# Patient Record
Sex: Female | Born: 1960
Health system: Southern US, Community
[De-identification: ages and names within clinical notes are randomized; demographics above are authoritative.]

## PROBLEM LIST (undated history)

## (undated) DIAGNOSIS — I10 Essential (primary) hypertension: Secondary | ICD-10-CM

## (undated) DIAGNOSIS — M1990 Unspecified osteoarthritis, unspecified site: Secondary | ICD-10-CM

## (undated) DIAGNOSIS — Z8669 Personal history of other diseases of the nervous system and sense organs: Secondary | ICD-10-CM

## (undated) DIAGNOSIS — R079 Chest pain, unspecified: Secondary | ICD-10-CM

## (undated) DIAGNOSIS — E559 Vitamin D deficiency, unspecified: Secondary | ICD-10-CM

## (undated) DIAGNOSIS — Z8489 Family history of other specified conditions: Secondary | ICD-10-CM

## (undated) DIAGNOSIS — I2510 Atherosclerotic heart disease of native coronary artery without angina pectoris: Secondary | ICD-10-CM

## (undated) DIAGNOSIS — T4145XA Adverse effect of unspecified anesthetic, initial encounter: Secondary | ICD-10-CM

## (undated) DIAGNOSIS — G43909 Migraine, unspecified, not intractable, without status migrainosus: Secondary | ICD-10-CM

## (undated) DIAGNOSIS — E785 Hyperlipidemia, unspecified: Secondary | ICD-10-CM

## (undated) DIAGNOSIS — Z9889 Other specified postprocedural states: Secondary | ICD-10-CM

## (undated) DIAGNOSIS — K5790 Diverticulosis of intestine, part unspecified, without perforation or abscess without bleeding: Secondary | ICD-10-CM

## (undated) DIAGNOSIS — K651 Peritoneal abscess: Secondary | ICD-10-CM

## (undated) DIAGNOSIS — C4490 Unspecified malignant neoplasm of skin, unspecified: Secondary | ICD-10-CM

## (undated) DIAGNOSIS — I219 Acute myocardial infarction, unspecified: Secondary | ICD-10-CM

## (undated) DIAGNOSIS — R7303 Prediabetes: Secondary | ICD-10-CM

## (undated) DIAGNOSIS — M2550 Pain in unspecified joint: Secondary | ICD-10-CM

## (undated) DIAGNOSIS — K5792 Diverticulitis of intestine, part unspecified, without perforation or abscess without bleeding: Secondary | ICD-10-CM

## (undated) DIAGNOSIS — K5900 Constipation, unspecified: Secondary | ICD-10-CM

## (undated) DIAGNOSIS — R112 Nausea with vomiting, unspecified: Secondary | ICD-10-CM

## (undated) DIAGNOSIS — M549 Dorsalgia, unspecified: Secondary | ICD-10-CM

## (undated) DIAGNOSIS — F419 Anxiety disorder, unspecified: Secondary | ICD-10-CM

## (undated) DIAGNOSIS — G4730 Sleep apnea, unspecified: Secondary | ICD-10-CM

## (undated) DIAGNOSIS — T8859XA Other complications of anesthesia, initial encounter: Secondary | ICD-10-CM

## (undated) DIAGNOSIS — Z87442 Personal history of urinary calculi: Secondary | ICD-10-CM

## (undated) HISTORY — DX: Chest pain, unspecified: R07.9

## (undated) HISTORY — DX: Acute myocardial infarction, unspecified: I21.9

## (undated) HISTORY — DX: Pain in unspecified joint: M25.50

## (undated) HISTORY — DX: Anxiety disorder, unspecified: F41.9

## (undated) HISTORY — DX: Constipation, unspecified: K59.00

## (undated) HISTORY — DX: Atherosclerotic heart disease of native coronary artery without angina pectoris: I25.10

## (undated) HISTORY — DX: Essential (primary) hypertension: I10

## (undated) HISTORY — DX: Hyperlipidemia, unspecified: E78.5

## (undated) HISTORY — PX: COLONOSCOPY: SHX174

## (undated) HISTORY — DX: Peritoneal abscess: K65.1

## (undated) HISTORY — DX: Unspecified malignant neoplasm of skin, unspecified: C44.90

## (undated) HISTORY — DX: Morbid (severe) obesity due to excess calories: E66.01

## (undated) HISTORY — DX: Vitamin D deficiency, unspecified: E55.9

## (undated) HISTORY — DX: Sleep apnea, unspecified: G47.30

## (undated) HISTORY — DX: Dorsalgia, unspecified: M54.9

## (undated) HISTORY — PX: MOHS SURGERY: SUR867

## (undated) HISTORY — DX: Diverticulosis of intestine, part unspecified, without perforation or abscess without bleeding: K57.90

## (undated) HISTORY — PX: APPENDECTOMY: SHX54

## (undated) HISTORY — PX: CORONARY STENT PLACEMENT: SHX1402

## (undated) HISTORY — PX: BACK SURGERY: SHX140

## (undated) HISTORY — PX: CARPAL TUNNEL RELEASE: SHX101

## (undated) HISTORY — PX: KNEE ARTHROSCOPY WITH MENISCAL REPAIR: SHX5653

---

## 1989-08-02 HISTORY — PX: TUBAL LIGATION: SHX77

## 1997-08-23 ENCOUNTER — Emergency Department (HOSPITAL_COMMUNITY): Admission: AD | Admit: 1997-08-23 | Discharge: 1997-08-23 | Payer: Self-pay

## 1998-08-11 ENCOUNTER — Encounter: Payer: Self-pay | Admitting: Neurosurgery

## 1998-08-14 ENCOUNTER — Encounter: Payer: Self-pay | Admitting: Neurosurgery

## 1998-08-14 ENCOUNTER — Ambulatory Visit (HOSPITAL_COMMUNITY): Admission: RE | Admit: 1998-08-14 | Discharge: 1998-08-14 | Payer: Self-pay | Admitting: Neurosurgery

## 2001-03-16 ENCOUNTER — Ambulatory Visit (HOSPITAL_COMMUNITY): Admission: RE | Admit: 2001-03-16 | Discharge: 2001-03-16 | Payer: Self-pay | Admitting: Pulmonary Disease

## 2001-03-16 ENCOUNTER — Encounter: Payer: Self-pay | Admitting: Pulmonary Disease

## 2001-05-01 ENCOUNTER — Other Ambulatory Visit: Admission: RE | Admit: 2001-05-01 | Discharge: 2001-05-01 | Payer: Self-pay | Admitting: Gynecology

## 2003-08-03 HISTORY — PX: ENDOMETRIAL ABLATION: SHX621

## 2005-10-18 ENCOUNTER — Other Ambulatory Visit: Admission: RE | Admit: 2005-10-18 | Discharge: 2005-10-18 | Payer: Self-pay | Admitting: Obstetrics and Gynecology

## 2005-12-10 ENCOUNTER — Encounter (INDEPENDENT_AMBULATORY_CARE_PROVIDER_SITE_OTHER): Payer: Self-pay | Admitting: Specialist

## 2005-12-10 ENCOUNTER — Ambulatory Visit (HOSPITAL_COMMUNITY): Admission: RE | Admit: 2005-12-10 | Discharge: 2005-12-10 | Payer: Self-pay | Admitting: Obstetrics and Gynecology

## 2008-08-02 DIAGNOSIS — I219 Acute myocardial infarction, unspecified: Secondary | ICD-10-CM

## 2008-08-02 HISTORY — DX: Acute myocardial infarction, unspecified: I21.9

## 2008-10-05 ENCOUNTER — Inpatient Hospital Stay (HOSPITAL_COMMUNITY): Admission: EM | Admit: 2008-10-05 | Discharge: 2008-10-07 | Payer: Self-pay | Admitting: Emergency Medicine

## 2009-04-17 ENCOUNTER — Ambulatory Visit (HOSPITAL_BASED_OUTPATIENT_CLINIC_OR_DEPARTMENT_OTHER): Admission: RE | Admit: 2009-04-17 | Discharge: 2009-04-17 | Payer: Self-pay | Admitting: Physician Assistant

## 2009-04-19 ENCOUNTER — Ambulatory Visit: Payer: Self-pay | Admitting: Internal Medicine

## 2009-04-23 ENCOUNTER — Ambulatory Visit (HOSPITAL_BASED_OUTPATIENT_CLINIC_OR_DEPARTMENT_OTHER): Admission: RE | Admit: 2009-04-23 | Discharge: 2009-04-23 | Payer: Self-pay | Admitting: Physician Assistant

## 2009-05-04 DIAGNOSIS — I219 Acute myocardial infarction, unspecified: Secondary | ICD-10-CM

## 2009-05-04 HISTORY — DX: Acute myocardial infarction, unspecified: I21.9

## 2010-08-17 ENCOUNTER — Ambulatory Visit (HOSPITAL_COMMUNITY)
Admission: RE | Admit: 2010-08-17 | Discharge: 2010-08-17 | Payer: Self-pay | Source: Home / Self Care | Attending: Obstetrics and Gynecology | Admitting: Obstetrics and Gynecology

## 2010-08-22 ENCOUNTER — Encounter: Payer: Self-pay | Admitting: Obstetrics and Gynecology

## 2010-08-23 ENCOUNTER — Encounter: Payer: Self-pay | Admitting: Family Medicine

## 2010-08-23 ENCOUNTER — Encounter: Payer: Self-pay | Admitting: Interventional Cardiology

## 2010-10-12 ENCOUNTER — Other Ambulatory Visit: Payer: Self-pay | Admitting: Family Medicine

## 2010-10-12 DIAGNOSIS — R52 Pain, unspecified: Secondary | ICD-10-CM

## 2010-10-12 DIAGNOSIS — R140 Abdominal distension (gaseous): Secondary | ICD-10-CM

## 2010-10-13 ENCOUNTER — Ambulatory Visit (HOSPITAL_COMMUNITY)
Admission: RE | Admit: 2010-10-13 | Discharge: 2010-10-13 | Disposition: A | Discharge status: Home / Self Care | Payer: 59 | Source: Ambulatory Visit | Attending: Family Medicine | Admitting: Family Medicine

## 2010-10-13 DIAGNOSIS — K5730 Diverticulosis of large intestine without perforation or abscess without bleeding: Secondary | ICD-10-CM | POA: Insufficient documentation

## 2010-10-13 DIAGNOSIS — R109 Unspecified abdominal pain: Secondary | ICD-10-CM | POA: Insufficient documentation

## 2010-10-13 DIAGNOSIS — R140 Abdominal distension (gaseous): Secondary | ICD-10-CM

## 2010-10-13 DIAGNOSIS — R9389 Abnormal findings on diagnostic imaging of other specified body structures: Secondary | ICD-10-CM | POA: Insufficient documentation

## 2010-10-13 DIAGNOSIS — R142 Eructation: Secondary | ICD-10-CM | POA: Insufficient documentation

## 2010-10-13 DIAGNOSIS — R141 Gas pain: Secondary | ICD-10-CM | POA: Insufficient documentation

## 2010-10-13 DIAGNOSIS — R52 Pain, unspecified: Secondary | ICD-10-CM

## 2010-10-13 MED ORDER — IOHEXOL 300 MG/ML  SOLN
100.0000 mL | Freq: Once | INTRAMUSCULAR | Status: AC | PRN
  Administered 2010-10-13: 100 mL via INTRAVENOUS

## 2010-10-14 ENCOUNTER — Ambulatory Visit (HOSPITAL_COMMUNITY)
Admission: RE | Admit: 2010-10-14 | Discharge: 2010-10-14 | Disposition: A | Discharge status: Home / Self Care | Payer: 59 | Source: Ambulatory Visit | Attending: Family Medicine | Admitting: Family Medicine

## 2010-10-14 ENCOUNTER — Other Ambulatory Visit: Payer: Self-pay | Admitting: Family Medicine

## 2010-10-14 DIAGNOSIS — D259 Leiomyoma of uterus, unspecified: Secondary | ICD-10-CM | POA: Insufficient documentation

## 2010-10-14 DIAGNOSIS — N838 Other noninflammatory disorders of ovary, fallopian tube and broad ligament: Secondary | ICD-10-CM

## 2010-10-14 DIAGNOSIS — N9489 Other specified conditions associated with female genital organs and menstrual cycle: Secondary | ICD-10-CM | POA: Insufficient documentation

## 2010-10-15 ENCOUNTER — Other Ambulatory Visit (HOSPITAL_COMMUNITY): Payer: 59

## 2010-10-19 ENCOUNTER — Other Ambulatory Visit (HOSPITAL_COMMUNITY)
Admission: RE | Admit: 2010-10-19 | Discharge: 2010-10-19 | Disposition: A | Discharge status: Home / Self Care | Payer: 59 | Source: Ambulatory Visit | Attending: Gynecology | Admitting: Gynecology

## 2010-10-19 ENCOUNTER — Ambulatory Visit (INDEPENDENT_AMBULATORY_CARE_PROVIDER_SITE_OTHER): Payer: 59 | Admitting: Gynecology

## 2010-10-19 ENCOUNTER — Other Ambulatory Visit: Payer: Self-pay | Admitting: Gynecology

## 2010-10-19 DIAGNOSIS — Z124 Encounter for screening for malignant neoplasm of cervix: Secondary | ICD-10-CM

## 2010-10-19 DIAGNOSIS — N949 Unspecified condition associated with female genital organs and menstrual cycle: Secondary | ICD-10-CM

## 2010-10-19 DIAGNOSIS — N83209 Unspecified ovarian cyst, unspecified side: Secondary | ICD-10-CM

## 2010-10-21 ENCOUNTER — Ambulatory Visit (INDEPENDENT_AMBULATORY_CARE_PROVIDER_SITE_OTHER): Payer: 59 | Admitting: Gynecology

## 2010-10-21 ENCOUNTER — Other Ambulatory Visit: Payer: 59

## 2010-10-21 DIAGNOSIS — N852 Hypertrophy of uterus: Secondary | ICD-10-CM

## 2010-10-21 DIAGNOSIS — D252 Subserosal leiomyoma of uterus: Secondary | ICD-10-CM

## 2010-10-21 DIAGNOSIS — D259 Leiomyoma of uterus, unspecified: Secondary | ICD-10-CM

## 2010-10-21 DIAGNOSIS — R1900 Intra-abdominal and pelvic swelling, mass and lump, unspecified site: Secondary | ICD-10-CM

## 2010-11-12 LAB — BASIC METABOLIC PANEL
BUN: 7 mg/dL (ref 6–23)
CO2: 25 mEq/L (ref 19–32)
Calcium: 8.5 mg/dL (ref 8.4–10.5)
Chloride: 108 mEq/L (ref 96–112)
Creatinine, Ser: 0.52 mg/dL (ref 0.4–1.2)
GFR calc Af Amer: >60 mL/min (ref >60)
GFR calc non Af Amer: >60 mL/min (ref >60)
Glucose, Bld: 104 mg/dL — ABNORMAL HIGH (ref 70–99)
Potassium: 3.5 mEq/L (ref 3.5–5.1)
Sodium: 138 mEq/L (ref 135–145)

## 2010-11-12 LAB — CARDIAC PANEL(CRET KIN+CKTOT+MB+TROPI)
CK, MB: 12.9 ng/mL — ABNORMAL HIGH (ref 0.3–4.0)
CK, MB: 25.1 ng/mL — ABNORMAL HIGH (ref 0.3–4.0)
CK, MB: 36.5 ng/mL — ABNORMAL HIGH (ref 0.3–4.0)
CK, MB: 5.8 ng/mL — ABNORMAL HIGH (ref 0.3–4.0)
Relative Index: 5 — ABNORMAL HIGH (ref 0.0–2.5)
Relative Index: 7 — ABNORMAL HIGH (ref 0.0–2.5)
Relative Index: 9.4 — ABNORMAL HIGH (ref 0.0–2.5)
Relative Index: 9.5 — ABNORMAL HIGH (ref 0.0–2.5)
Total CK: 116 U/L (ref 7–177)
Total CK: 183 U/L — ABNORMAL HIGH (ref 7–177)
Total CK: 265 U/L — ABNORMAL HIGH (ref 7–177)
Total CK: 389 U/L — ABNORMAL HIGH (ref 7–177)
Troponin I: 0.37 ng/mL — ABNORMAL HIGH (ref 0.00–0.06)
Troponin I: 2.72 ng/mL (ref 0.00–0.06)
Troponin I: 4.15 ng/mL (ref 0.00–0.06)
Troponin I: 8.12 ng/mL (ref 0.00–0.06)

## 2010-11-12 LAB — CBC
HCT: 35.6 % — ABNORMAL LOW (ref 36.0–46.0)
HCT: 37 % (ref 36.0–46.0)
Hemoglobin: 12.5 g/dL (ref 12.0–15.0)
Hemoglobin: 12.9 g/dL (ref 12.0–15.0)
MCHC: 34.8 g/dL (ref 30.0–36.0)
MCHC: 35.1 g/dL (ref 30.0–36.0)
MCV: 86.5 fL (ref 78.0–100.0)
MCV: 87.1 fL (ref 78.0–100.0)
Platelets: 184 10*3/uL (ref 150–400)
Platelets: 185 10*3/uL (ref 150–400)
RBC: 4.12 MIL/uL (ref 3.87–5.11)
RBC: 4.25 MIL/uL (ref 3.87–5.11)
RDW: 13.7 % (ref 11.5–15.5)
RDW: 13.8 % (ref 11.5–15.5)
WBC: 10.9 10*3/uL — ABNORMAL HIGH (ref 4.0–10.5)
WBC: 13.6 10*3/uL — ABNORMAL HIGH (ref 4.0–10.5)

## 2010-11-12 LAB — COMPREHENSIVE METABOLIC PANEL
ALT: 12 U/L (ref 0–35)
AST: 15 U/L (ref 0–37)
Albumin: 3.1 g/dL — ABNORMAL LOW (ref 3.5–5.2)
Alkaline Phosphatase: 53 U/L (ref 39–117)
BUN: 10 mg/dL (ref 6–23)
CO2: 21 mEq/L (ref 19–32)
Calcium: 8.3 mg/dL — ABNORMAL LOW (ref 8.4–10.5)
Chloride: 107 mEq/L (ref 96–112)
Creatinine, Ser: 0.48 mg/dL (ref 0.4–1.2)
GFR calc Af Amer: >60 mL/min (ref >60)
GFR calc non Af Amer: >60 mL/min (ref >60)
Glucose, Bld: 122 mg/dL — ABNORMAL HIGH (ref 70–99)
Potassium: 3.5 mEq/L (ref 3.5–5.1)
Sodium: 135 mEq/L (ref 135–145)
Total Bilirubin: 0.3 mg/dL (ref 0.3–1.2)
Total Protein: 5.7 g/dL — ABNORMAL LOW (ref 6.0–8.3)

## 2010-11-12 LAB — DIFFERENTIAL
Basophils Absolute: 0 10*3/uL (ref 0.0–0.1)
Basophils Relative: 0 % (ref 0–1)
Eosinophils Absolute: 0.1 10*3/uL (ref 0.0–0.7)
Eosinophils Relative: 0 % (ref 0–5)
Lymphocytes Relative: 11 % — ABNORMAL LOW (ref 12–46)
Lymphs Abs: 1.4 10*3/uL (ref 0.7–4.0)
Monocytes Absolute: 0.7 10*3/uL (ref 0.1–1.0)
Monocytes Relative: 5 % (ref 3–12)
Neutro Abs: 11.4 10*3/uL — ABNORMAL HIGH (ref 1.7–7.7)
Neutrophils Relative %: 84 % — ABNORMAL HIGH (ref 43–77)

## 2010-11-12 LAB — HEMOGLOBIN A1C
Hgb A1c MFr Bld: 5.6 % (ref 4.6–6.1)
Mean Plasma Glucose: 114 mg/dL

## 2010-11-12 LAB — POCT I-STAT, CHEM 8
BUN: 10 mg/dL (ref 6–23)
Calcium, Ion: 1.17 mmol/L (ref 1.12–1.32)
Chloride: 105 mEq/L (ref 96–112)
Creatinine, Ser: 0.6 mg/dL (ref 0.4–1.2)
Glucose, Bld: 115 mg/dL — ABNORMAL HIGH (ref 70–99)
HCT: 38 % (ref 36.0–46.0)
Hemoglobin: 12.9 g/dL (ref 12.0–15.0)
Potassium: 3.6 mEq/L (ref 3.5–5.1)
Sodium: 135 mEq/L (ref 135–145)
TCO2: 20 mmol/L (ref 0–100)

## 2010-11-12 LAB — LIPID PANEL
Cholesterol: 146 mg/dL (ref 0–200)
HDL: 24 mg/dL — ABNORMAL LOW (ref >39)
LDL Cholesterol: 96 mg/dL (ref 0–99)
Total CHOL/HDL Ratio: 6.1 RATIO
Triglycerides: 131 mg/dL (ref <150)
VLDL: 26 mg/dL (ref 0–40)

## 2010-11-12 LAB — PROTIME-INR
INR: 2.8 — ABNORMAL HIGH (ref 0.00–1.49)
Prothrombin Time: 31.5 seconds — ABNORMAL HIGH (ref 11.6–15.2)

## 2010-11-12 LAB — CK TOTAL AND CKMB (NOT AT ARMC)
CK, MB: 3.5 ng/mL (ref 0.3–4.0)
Relative Index: 3.2 — ABNORMAL HIGH (ref 0.0–2.5)
Total CK: 108 U/L (ref 7–177)

## 2010-11-12 LAB — TSH: TSH: 1.182 u[IU]/mL (ref 0.350–4.500)

## 2010-11-12 LAB — APTT: aPTT: >200 seconds (ref 24–37)

## 2010-12-15 NOTE — Discharge Summary (Signed)
Heather Lam, LUNDEN NO.:  [id number]   MEDICAL RECORD NO.:  [id number]          PATIENT TYPE:  INP   LOCATION:  3702                         FACILITY:  [facility]   PHYSICIAN:  Jake Bathe, MD      DATE OF BIRTH:  1961/02/10   DATE OF ADMISSION:  10/05/2008  DATE OF DISCHARGE:  10/07/2008                               DISCHARGE SUMMARY   PRIMARY CARE PHYSICIAN:  Ernestina Penna, MD   CARDIOLOGIST:  Lyn Records, MD   FINAL DIAGNOSES:  1. Acute inferior myocardial infarction.  2. Coronary artery disease - status post right coronary artery stent -      drug eluting XIENCE 2.75 mm x 28 mm and a 2.75 mm x 15 mm stent      with postdilatation to 3.0.  The patient does have residual      moderate left anterior descending disease, inferior hypokinesis      noted on left ventriculogram.  3. Hypertension.  4. Hyperlipidemia.  5. Depression.   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 had the acute substernal chest  pressure with left arm numbness and was brought emergently to the  cardiac catheterization lab where Dr. Katrinka Blazing placed 2 drug-eluting stents  in her right coronary artery.  She tolerated the procedure well.  On the  morning of discharge, she has no symptoms of shortness of breath or  chest pain.  She does feel mild fatigue.  She has had no bleeding, no  syncope, no dizziness, was ambulating well.   PHYSICAL EXAMINATION:  VITAL SIGNS:  Blood pressure 132/77, temperature  97.2, pulse 77, respirations 18, sating 96% on room air.  GENERAL:  Alert, oriented x3, in no acute distress.  CARDIOVASCULAR:  Regular rate and rhythm with no murmurs, rubs, or  gallops.  LUNGS:  Clear to auscultation bilaterally.  ABDOMEN:  Soft, nontender, normoactive bowel sounds.  EXTREMITIES:  No clubbing, cyanosis, or edema.  Cath site, clean, dry,  and intact.  Normal distal pulses.   LABORATORY DATA:  Total  cholesterol 146, triglycerides 131, HDL 24, LDL  96.  TSH 1.1, within normal limits.  Peak CK 389, peak MB 36.5.  Creatinine 0.52, potassium 3.5.   DISCHARGE MEDICATIONS:  1. Plavix 75 mg once a day.  2. Aspirin 325 mg once a day.  3. Simvastatin 80 mg a day.  4. Metoprolol succinate 25 mg a day.  5. Nitroglycerin 0.4 mg sublingual as needed.  6. Lexapro 20 mg a day.  7. Try to avoid Celebrex.  8. Benicar, we will give 20 mg once a day without the HCT portion -      note potassium was 3.5 and 3.6 during hospitalization.  This may      need to be increased once again back to her original dose of      40/12.5 of HCT depending on blood pressure.   FOLLOWUP:  The patient has followup with Dr. Dellia Cloud, NP, on  Thursday October 17, 2008, at 2:40 p.m.  She knows to seek medical  attention if any worrisome symptoms develop.      Jake Bathe, MD  Electronically Signed     MCS/MEDQ  D:  10/07/2008  T:  10/07/2008  Job:  [id number]   cc:   Ernestina Penna, M.D.

## 2010-12-15 NOTE — Cardiovascular Report (Signed)
NAMESANII, KUKLA NO.:  [id number]   MEDICAL RECORD NO.:  [id number]          PATIENT TYPE:  INP   LOCATION:  2908                         FACILITY:  [facility]   PHYSICIAN:  Lyn Records, M.D.   DATE OF BIRTH:  1960/11/09   DATE OF PROCEDURE:  10/05/2008  DATE OF DISCHARGE:                            CARDIAC CATHETERIZATION   INDICATION:  Acute inferior myocardial infarction.   PROCEDURES PERFORMED:  1. Emergency left heart catheterization.  2. Coronary angiography.  3. Left ventriculography.  4. Drug-eluting stent mid right coronary.   DESCRIPTION:  After informed consent, a 6-French sheath was placed in  the right femoral artery using modified Seldinger technique.  The  patient was given IV Versed 1 mg and 50 mcg of fentanyl.  After  administration of these medications, the procedure was started.  A  single anterior wall stick was performed without complications.  A 6-  [place] A2 multipurpose catheter was then used for hemodynamic  recordings, left ventriculography by hand injection, and selective left  and right coronary angiography.  A #4 left Judkins catheter was used for  left coronary angio.   After identifying total occlusion of the mid right coronary, a 6-French  guide catheter with a JR-4 bend was used to visualize the right  coronary.  Angiomax loading bolus and infusion was begun.  Plavix 600 mg  was administered.  An ASAHI Prowater wire was easily able to cross the  stenosis in the midvessel.  Predilatation with a 15 mm long x 2.5 mm  apex balloon was performed.  Intracoronary nitroglycerin was  administered.  We then deployed a 275 x 28 and had to overlap with a 275  x 15 XIENCE V drug-eluting stents.  Postdilatation was performed using a  3.0 x 20-mm long Voyager [place] balloon up to 15 atmospheres throughout the  stented segment.  The patient had dramatic relief of chest pain and ST-  segment abnormalities after the initial balloon inflation.   No  complications occurred during the procedure.   The patient received Angio-Seal with good hemostasis.   RESULTS:  1. Hemodynamic data:      a.     Aortic pressure 121/75 mmHg.      b.     Left ventricular pressure 122/16.  2. Left ventriculography:  Overall EF is preserved.  Ejection fraction      is 60%.  Moderate inferior wall hypokinesis is noted.  3. Coronary angiography.      a.     Left main coronary:  The left main is widely patent and free       of obstruction.      b.     Left anterior descending coronary:  The proximal LAD       contains an eccentric 50-70% proximal stenosis.  The initial shot       demonstrated slight reduction in flow.  A large first diagonal       branch arises from the LAD and the narrowed segment.      c.     Circumflex artery:  Circumflex coronary artery  contains 3       obtuse marginal branches with 40% narrowing proximal to the first       obtuse marginal and luminal irregularities in the first obtuse       marginal.  No significant obstructions are noted.      d.     Right coronary:  Totally occluded after the first acute       marginal branch.  4. PCI node:  The right coronary was reduced 100% to 0% with TIMI      grade 3 flow post stenting and dilatation with high-pressure      ballooning up to 3 mm in diameter.   CONCLUSION:  1. Moderate-to-moderately severe proximal LAD in the 50-70% range,      mild-to-moderate circumflex disease is noted.  2. Overall normal LV function.   PLAN:  1. Aspirin and Plavix for greater than a year.  2. Consider stress testing in 4-6 months to evaluate the severity of      the LAD.  3. Aggressive statin therapy with Zocor 80 mg per day.  4. Discharge within 48 hours.      Lyn Records, M.D.  Electronically Signed     HWS/MEDQ  D:  10/05/2008  T:  10/06/2008  Job:  [id number]   cc:   Ernestina Penna, M.D.

## 2010-12-15 NOTE — H&P (Signed)
NAMEMARDA, Heather Lam NO.:  [id number]   MEDICAL RECORD NO.:  [id number]          PATIENT TYPE:  INP   LOCATION:  2908                         FACILITY:  [facility]   PHYSICIAN:  Lyn Records, M.D.   DATE OF BIRTH:  [birth year]   DATE OF ADMISSION:  10/05/2008  DATE OF DISCHARGE:                              HISTORY & PHYSICAL   PRIMARY PHYSICIAN:  Ernestina Penna, MD   REASON FOR ADMISSION:  Acute inferior myocardial infarction.   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  At 11:30 a.m., she had the acute onset of  substernal chest pressure with left arm numbness.  She was brought by  EMS to the [facility] Emergency Room arriving at around 10 minutes after  1.  The discomfort was unrelenting.  There was nausea, diaphoresis, and  some dyspnea.  She did not have syncope or tachy palpitations.  No  history of coronary disease.   HABITS:  Smokes one and half packs of cigarettes per day for greater  than 25 years.  She engages in social alcohol ingestion.   SIGNIFICANT MEDICAL PROBLEMS:  1. Hypertension.  2. Hyperlipidemia.   MEDICATIONS:  1. Benicar HCT 40/12.5 mg.  2. Lexapro 20 mg per day.  3. Celebrex 200 mg per day, add a dose today.  4. Lorazepam 0.5 mg p.r.n.  5. Aspirin 325 mg per day.   ALLERGIES:  None.   FAMILY HISTORY:  Not significant for premature atherosclerosis.   REVIEW OF SYSTEMS:  Unremarkable.   OBJECTIVE:  Vital signs:  Blood pressure 118/70, heart rate 88.  Skin:  Somewhat pale.  Eyes:  Pupils are equal and reactive.  Neck:  Reveals no JVD or carotid bruits.  Carotids are 2+ bilaterally.  Lungs:  Clear to auscultation and percussion.  Cardiac:  Reveals no rub or gallop.  Femoral pulses are 2+ bilaterally.  Dorsalis pedis pulses are 2+.  Neurological:  Normal.   EKG reveals 1.5-2 mm of ST elevation in the inferior leads II, III, aVF  and also ST depression in V2, V3, V1, and  aVL.  Chest x-ray is pending.  Laboratory data is pending.   ASSESSMENT:  1. Acute inferior myocardial infarction with ongoing chest pain.  2. History of hypertension.  3. Active cigarette smoker.  4. Hyperlipidemia.   PLAN:  Emergency cath with possible percutaneous intervention depending  upon findings.  The risks of the procedure including stroke, MI, death,  bleeding, allergy, renal failure, limb loss, among others were discussed  in detail and accepted by the patient.  The family was also apprised of  her condition, the planned procedure, and the risks involved.      Lyn Records, M.D.  Electronically Signed     HWS/MEDQ  D:  10/05/2008  T:  10/06/2008  Job:  [id number]

## 2010-12-16 ENCOUNTER — Ambulatory Visit (INDEPENDENT_AMBULATORY_CARE_PROVIDER_SITE_OTHER): Payer: 59 | Admitting: Gynecology

## 2010-12-16 ENCOUNTER — Other Ambulatory Visit: Payer: 59

## 2010-12-16 DIAGNOSIS — N949 Unspecified condition associated with female genital organs and menstrual cycle: Secondary | ICD-10-CM

## 2010-12-16 DIAGNOSIS — N83209 Unspecified ovarian cyst, unspecified side: Secondary | ICD-10-CM

## 2010-12-16 DIAGNOSIS — R1031 Right lower quadrant pain: Secondary | ICD-10-CM

## 2010-12-18 NOTE — Op Note (Signed)
NAMEMELISSA, Heather Lam               ACCOUNT NO.:  [id number]   MEDICAL RECORD NO.:  [id number]          PATIENT TYPE:  AMB   LOCATION:  SDC                           FACILITY:  [facility]   PHYSICIAN:  Naima A. Dillard, M.D. DATE OF BIRTH:  [birth year]   DATE OF PROCEDURE:  12/10/2005  DATE OF DISCHARGE:                                 OPERATIVE REPORT   PREOPERATIVE DIAGNOSES:  1.  Menometrorrhagia.  2.  Endometrial polyp.   POSTOPERATIVE DIAGNOSES:  1.  Menometrorrhagia.  2.  Endometrial polyp.   PROCEDURE:  Dilatation and curettage, hysteroscopy, polypectomy,  ThermaChoice ablation.   SURGEON:  Naima A. Normand Sloop, M.D.  There are no assistants.   ANESTHESIA:  General laryngeal mask airway and 10 mL of 1% lidocaine used  for cervical block.   SPECIMENS:  Endometrial polyps and endometrial curettings.   ESTIMATED BLOOD LOSS:  Minimal.   COMPLICATIONS:  None.   The patient went to PACU in stable condition.   PROCEDURE IN DETAIL:  The patient was taken to the operating room, where she  was given general anesthesia, placed in dorsal lithotomy position and  prepped and draped in the normal sterile fashion.  Before the procedure in  the office, the patient was told the risks were but not limited to bleeding,  infection, damage to internal organs such as bowel, bladder and major blood  vessels, and a failure to cure menometrorrhagia.  The patient still wanted  to proceed with the procedure.  A bivalve speculum was placed into the  vagina.  The anterior lip of the cervix was grasped with a single-tooth  tenaculum.  Lidocaine 1% 10 mL was placed for a cervical block.  The uterus  was sounded to 10 cm.  The cervix was further dilated with Shawnie Pons dilators up  to #21.  The hysteroscope was placed into the uterine cavity.  There was a  small polyp and abundant endometrium noted.  No submucosal fibroids noted.  The hysteroscope was removed.  Polyp forceps were placed into the  endometrial  cavity and the polyp was excised.  A sharp curette was done  until a gritty texture was noted.  The endometrial curettings and polyp were  sent to pathology.  The ThermaChoice representative, Elroy Channel, was  present.  The ThermaChoice balloon was primed and then placed into the  uterine cavity until the fundus was touched and then moved back just 0.5 cm.  The balloon was then filled with fluid until appropriate pressure was done.  Ablation was done for a total of 8 minutes.  The  ThermaChoice was removed.  All instruments were removed from the uterus and  cervix.  The tenaculum site was made hemostatic with silver nitrate.  Sponge, lap and needle counts were correct.  The patient went to the  recovery room in stable condition.      Naima A. Normand Sloop, M.D.  Electronically Signed     NAD/MEDQ  D:  12/10/2005  T:  12/11/2005  Job:  [id number]

## 2011-02-16 ENCOUNTER — Other Ambulatory Visit (HOSPITAL_COMMUNITY): Payer: Self-pay | Admitting: Interventional Cardiology

## 2011-02-16 DIAGNOSIS — R0789 Other chest pain: Secondary | ICD-10-CM

## 2011-02-16 DIAGNOSIS — I2510 Atherosclerotic heart disease of native coronary artery without angina pectoris: Secondary | ICD-10-CM

## 2011-02-18 ENCOUNTER — Ambulatory Visit (HOSPITAL_COMMUNITY)
Admission: RE | Admit: 2011-02-18 | Discharge: 2011-02-18 | Payer: 59 | Source: Ambulatory Visit | Attending: Interventional Cardiology | Admitting: Interventional Cardiology

## 2011-02-18 ENCOUNTER — Ambulatory Visit (HOSPITAL_COMMUNITY): Admission: RE | Admit: 2011-02-18 | Payer: 59 | Source: Ambulatory Visit

## 2011-02-18 ENCOUNTER — Encounter (HOSPITAL_COMMUNITY)
Admission: RE | Admit: 2011-02-18 | Discharge: 2011-02-18 | Disposition: A | Discharge status: Home / Self Care | Payer: 59 | Source: Ambulatory Visit | Attending: Interventional Cardiology | Admitting: Interventional Cardiology

## 2011-02-18 DIAGNOSIS — I2510 Atherosclerotic heart disease of native coronary artery without angina pectoris: Secondary | ICD-10-CM | POA: Insufficient documentation

## 2011-02-18 DIAGNOSIS — R0789 Other chest pain: Secondary | ICD-10-CM | POA: Insufficient documentation

## 2011-02-18 MED ORDER — TECHNETIUM TC 99M TETROFOSMIN IV KIT
30.0000 | PACK | Freq: Once | INTRAVENOUS | Status: AC | PRN
  Administered 2011-02-18: 30 via INTRAVENOUS

## 2011-02-18 MED ORDER — TECHNETIUM TC 99M TETROFOSMIN IV KIT
10.0000 | PACK | Freq: Once | INTRAVENOUS | Status: AC | PRN
  Administered 2011-02-18: 10 via INTRAVENOUS

## 2011-05-06 ENCOUNTER — Ambulatory Visit: Payer: 59 | Admitting: Internal Medicine

## 2011-08-13 ENCOUNTER — Other Ambulatory Visit: Payer: Self-pay | Admitting: Family Medicine

## 2011-08-13 DIAGNOSIS — Z1231 Encounter for screening mammogram for malignant neoplasm of breast: Secondary | ICD-10-CM

## 2011-09-13 ENCOUNTER — Ambulatory Visit (INDEPENDENT_AMBULATORY_CARE_PROVIDER_SITE_OTHER): Payer: 59 | Admitting: Internal Medicine

## 2011-09-13 ENCOUNTER — Ambulatory Visit (HOSPITAL_COMMUNITY)
Admission: RE | Admit: 2011-09-13 | Discharge: 2011-09-13 | Disposition: A | Discharge status: Home / Self Care | Payer: 59 | Source: Ambulatory Visit | Attending: Family Medicine | Admitting: Family Medicine

## 2011-09-13 ENCOUNTER — Encounter: Payer: Self-pay | Admitting: Internal Medicine

## 2011-09-13 VITALS — BP 128/64 | HR 72 | Ht 63.0 in | Wt 219.0 lb

## 2011-09-13 DIAGNOSIS — Z8719 Personal history of other diseases of the digestive system: Secondary | ICD-10-CM

## 2011-09-13 DIAGNOSIS — I2510 Atherosclerotic heart disease of native coronary artery without angina pectoris: Secondary | ICD-10-CM | POA: Insufficient documentation

## 2011-09-13 DIAGNOSIS — E785 Hyperlipidemia, unspecified: Secondary | ICD-10-CM | POA: Insufficient documentation

## 2011-09-13 DIAGNOSIS — Z1211 Encounter for screening for malignant neoplasm of colon: Secondary | ICD-10-CM

## 2011-09-13 DIAGNOSIS — G4733 Obstructive sleep apnea (adult) (pediatric): Secondary | ICD-10-CM | POA: Insufficient documentation

## 2011-09-13 DIAGNOSIS — K5792 Diverticulitis of intestine, part unspecified, without perforation or abscess without bleeding: Secondary | ICD-10-CM | POA: Insufficient documentation

## 2011-09-13 DIAGNOSIS — Z7902 Long term (current) use of antithrombotics/antiplatelets: Secondary | ICD-10-CM

## 2011-09-13 DIAGNOSIS — I10 Essential (primary) hypertension: Secondary | ICD-10-CM | POA: Insufficient documentation

## 2011-09-13 DIAGNOSIS — Z1231 Encounter for screening mammogram for malignant neoplasm of breast: Secondary | ICD-10-CM | POA: Insufficient documentation

## 2011-09-13 NOTE — Progress Notes (Signed)
Subjective:    Patient ID: Heather Lam, female    DOB: [name] 27, 1961, 51 y.o.   MRN: [id number]  HPI Mrs. Balthaser is a 51 year old female with a past medical history of hypertension, hyperlipidemia, diverticulitis in March 2012, CAD, OSA, and anxiety who seen in consultation at the request of Dr. Christell Constant for evaluation of cortical cancer screening.  The patient reports she is doing well with no complaints today. She did have a CT scan of her abdomen in March 2012 which revealed diverticulitis and she was successfully treated with antibiotics. Check complete resolution of her pain. Her bowel habits are normal for her, and her stools are formed and brown. She denies melena or bright red blood per rectum. No significant heartburn, abdominal pain, nausea or vomiting. No dysphagia or odynophagia. She does take Plavix once daily, and she has done this since 2010 when she underwent PCI with placement of 2 stents.  Of note she did have an ovarian cyst seen by CT and followup transvaginal ultrasound, however this lesion resolved on her followup transvaginal ultrasound  Review of Systems Constitutional: Negative for fever, chills, night sweats, activity change, appetite change and unexpected weight change HEENT: Negative for sore throat, mouth sores and trouble swallowing. Eyes: Negative for visual disturbance Respiratory: Negative for cough, chest tightness and shortness of breath Cardiovascular: Negative for chest pain, palpitations and lower extremity swelling Gastrointestinal: See history of present illness Genitourinary: Negative for dysuria and hematuria. Musculoskeletal: Negative for back pain, arthralgias and myalgias Skin: Negative for rash or color change Neurological: Negative for headaches, weakness, numbness Hematological: Negative for adenopathy, negative for easy bruising/bleeding Psychiatric/behavioral: Negative for depressed mood, negative for anxiety   Past Medical History  Diagnosis  Date  . Skin cancer   . Anxiety   . CAD (coronary artery disease)   . Diverticulosis   . Hyperlipemia   . Hypertension   . Sleep apnea   . Heart attack 2010   Past Surgical History  Procedure Date  . Appendectomy   . Coronary stent placement   . Back surgery    Current Outpatient Prescriptions  Medication Sig Dispense Refill  . aspirin 325 MG tablet Take 325 mg by mouth daily.      . calcium carbonate (CALCIUM 600) 600 MG TABS Take 600 mg by mouth daily.      . Cholecalciferol (VITAMIN D) 1000 UNITS capsule Take 1,000 Units by mouth daily.      . clopidogrel (PLAVIX) 75 MG tablet Take 75 mg by mouth daily.      [name] escitalopram (LEXAPRO) 10 MG tablet Take 10 mg by mouth daily.      . metoprolol tartrate (LOPRESSOR) 25 MG tablet Take 25 mg by mouth 2 (two) times daily.      [name] olmesartan-hydrochlorothiazide (BENICAR HCT) 40-12.5 MG per tablet Take 1 tablet by mouth daily.      . rosuvastatin (CRESTOR) 10 MG tablet Take 10 mg by mouth as directed.       No Known Allergies  Family History  Problem Relation Age of Onset  . Colon cancer Neg Hx    History   Social History  . Marital Status: Married    Spouse Name: N/A    Number of Children: N/A  . Years of Education: N/A   Social History Main Topics  . Smoking status: Former Games developer  . Smokeless tobacco: Never Used  . Alcohol Use: Yes     Rare   . Drug Use: No  . Sexually  Active: None   Other Topics Concern  . None   Social History Narrative   Caffeine daily        Objective:   Physical Exam BP 128/64  Pulse 72  Ht 5\' 3"  (1.6 m)  Wt 219 lb (99.338 kg)  BMI 38.79 kg/m2  LMP 07/23/2011 Constitutional: Well-developed and well-nourished. No distress. HEENT: Normocephalic and atraumatic. Oropharynx is clear and moist. No oropharyngeal exudate. Conjunctivae are normal. Pupils are equal round and reactive to light. No scleral icterus. Neck: Neck supple. Trachea midline. Cardiovascular: Normal rate, regular rhythm  and intact distal pulses. No M/R/G Pulmonary/chest: Effort normal and breath sounds normal. No wheezing, rales or rhonchi. Abdominal: Soft, nontender, nondistended. Bowel sounds active throughout. There are no masses palpable. No hepatosplenomegaly. Extremities: no clubbing, cyanosis, or edema Lymphadenopathy: No cervical adenopathy noted. Neurological: Alert and oriented to person place and time. Skin: Skin is warm and dry. No rashes noted. Psychiatric: Normal mood and affect. Behavior is normal.  Imaging: 10/12/10 Clinical Data: Abdominal pain and bloating.  Hematuria. History of appendectomy, tubal ligation.  History of cardiac stent.   CT ABDOMEN AND PELVIS WITH CONTRAST   Technique:  Multidetector CT imaging of the abdomen and pelvis was performed following the standard protocol during bolus administration of intravenous contrast.   Contrast: 100 ml Omnipaque-300   Comparison: None   Findings: Lung bases are clear.  No focal abnormality is identified within the liver, spleen, pancreas, adrenal glands.   Right upper pole renal cyst is 1.5 cm.  Left lower pole renal cyst is 6.0 cm.  No hydronephrosis.  The gallbladder is present and is contracted.  The stomach, small bowel have a normal appearance. The appendix is surgically absent.  Numerous colonic diverticula are present.  There is mild stranding surrounding the sigmoid diverticula, consistent with acute diverticulitis.  No associated abscess.   The uterus is present.  Within the right adnexal region, there is a rounded mass, continuous with the ovary and the right fundal region of the uterus.  This measures 4.7 x 4.6 cm. This mass could represent a right fundal fibroid or right ovarian mass.  Further evaluation with ultrasound is recommended.  There is a right lateral uterine fibroid measuring 4.6 cm as well.  The uterus is enlarged.  Left adnexal region is unremarkable in appearance.  No free pelvic fluid.     IMPRESSION:   1.  Diverticulosis and mild strandy changes of sigmoid diverticulitis.  No associated abscess. 2.  Right adnexal rounded mass.  Differential diagnosis includes right fundal fibroid or ovarian mass.  Further evaluation with transvaginal and transabdominal ultrasound of the pelvis is suggested. This can be performed as an outpatient.      Assessment & Plan:   51 year old female with a past medical history of hypertension, hyperlipidemia, diverticulitis in March 2012, CAD, OSA, and anxiety who seen in consultation at the request of Dr. Christell Constant for evaluation of cortical cancer screening  1. CRC screening -- the patient does have a family history of colon polyps in her sister and father. She is also 15 years old, and has had an episode of diverticulitis. All of these things are reason for proceed with colonoscopy for colorectal cancer screening at this time. We discussed procedure including the risk and benefits and she is agreeable to proceed. This will be scheduled with propofol at her request.  She is on Plavix, and I will ask her cardiologist, Dr. Katrinka Blazing, for permission to hold this medication 5 days prior  to her colonoscopy.

## 2011-09-13 NOTE — Patient Instructions (Signed)
Dr. Rhea Belton recommends you get a colonoscopy.  Please give a call later this month to schedule.

## 2011-09-23 ENCOUNTER — Encounter: Payer: Self-pay | Admitting: Internal Medicine

## 2011-10-12 ENCOUNTER — Other Ambulatory Visit: Payer: Self-pay | Admitting: Gastroenterology

## 2011-10-12 ENCOUNTER — Telehealth: Payer: Self-pay | Admitting: *Deleted

## 2011-10-12 ENCOUNTER — Telehealth: Payer: Self-pay | Admitting: Gastroenterology

## 2011-10-12 NOTE — Telephone Encounter (Signed)
No order found to hold Plavix for 5 days.  Could you check on this for Heather Lam.  She is scheduled for Previsit 10/13/11 and her colon is for 10/26/11

## 2011-10-12 NOTE — Telephone Encounter (Signed)
Faxed Anti-coagulation letter to Dr. Verdis Prime

## 2011-10-12 NOTE — Telephone Encounter (Signed)
10/12/2011    RE: Heather Lam DOB: 13-Apr-1961 MRN: [id number]   Dear Dr. Christell Constant,    We have scheduled the above patient for an colonoscopy procedure. Our records show that she is on anticoagulation therapy.   Please advise as to how long the patient may come off her therapy of Plavix prior to the procedure, which is scheduled for 10/26/2011  Please fax back/ or route the completed form to [place] or Aram [place]cham at [phone]  Sincerely,  Franciso Bend, Raford Pitcher   Please respond by October 13, 2011 and route back to [place] or [place]

## 2011-10-13 ENCOUNTER — Ambulatory Visit (AMBULATORY_SURGERY_CENTER): Payer: 59

## 2011-10-13 VITALS — Ht 63.0 in | Wt 217.0 lb

## 2011-10-13 DIAGNOSIS — Z1211 Encounter for screening for malignant neoplasm of colon: Secondary | ICD-10-CM

## 2011-10-13 MED ORDER — PEG-KCL-NACL-NASULF-NA ASC-C 100 G PO SOLR: 1.0000 | Freq: Once | ORAL | Status: DC

## 2011-10-15 ENCOUNTER — Telehealth: Payer: Self-pay | Admitting: Gastroenterology

## 2011-10-15 NOTE — Telephone Encounter (Signed)
Got a fax from Dr. Verdis Prime regarding pt's plavix, she can stop taking Plavix and aspirin 7 days prior to her procedure, she can d/c plavix permanently and decrease asa to 81 mg daily when it is safe after procedure. I called pt. Explained what Dr. Katrinka Blazing recommended. Pt stated she understood.

## 2011-10-26 ENCOUNTER — Ambulatory Visit (AMBULATORY_SURGERY_CENTER): Payer: 59 | Admitting: Internal Medicine

## 2011-10-26 ENCOUNTER — Encounter: Payer: Self-pay | Admitting: Internal Medicine

## 2011-10-26 VITALS — BP 147/79 | HR 64 | Temp 97.8°F | Resp 24 | Ht 63.0 in | Wt 217.0 lb

## 2011-10-26 DIAGNOSIS — K5732 Diverticulitis of large intestine without perforation or abscess without bleeding: Secondary | ICD-10-CM

## 2011-10-26 DIAGNOSIS — Z1211 Encounter for screening for malignant neoplasm of colon: Secondary | ICD-10-CM

## 2011-10-26 MED ORDER — SODIUM CHLORIDE 0.9 % IV SOLN: 500.0000 mL | INTRAVENOUS | Status: DC

## 2011-10-26 NOTE — Progress Notes (Signed)
The pt tolerated the colonoscopy very well. Maw   

## 2011-10-26 NOTE — Progress Notes (Signed)
Pt states she took her last plavix 10-19-11.  ewm

## 2011-10-26 NOTE — Progress Notes (Signed)
Dr. Rhea Belton was made aware per the pt her last dose of PLAVIX was 10/19/11 preprocedure. maw

## 2011-10-26 NOTE — Patient Instructions (Addendum)

## 2011-10-26 NOTE — Progress Notes (Signed)
Patient did not have preoperative order for IV antibiotic SSI prophylaxis. (G8918)  Patient did not experience any of the following events: a burn prior to discharge; a fall within the facility; wrong site/side/patient/procedure/implant event; or a hospital transfer or hospital admission upon discharge from the facility. (G8907)  

## 2011-10-26 NOTE — Op Note (Signed)
[facility] [address] [place], [place]  [address]  COLONOSCOPY PROCEDURE REPORT  PATIENT:  Heather Lam, Heather Lam  MR#:  [id number] BIRTHDATE:  10-01-1960, 50 yrs. old  GENDER:  female ENDOSCOPIST:  Carie Caddy. Monalisa Bayless, MD REF. BY:  Rudi Heap, M.D. PROCEDURE DATE:  10/26/2011 PROCEDURE:  Colonoscopy 62130 ASA CLASS:  Class III INDICATIONS:  Routine Risk Screening, history of diverticulitis, 1st colonoscopy MEDICATIONS:   MAC sedation, administered by CRNA, propofol (Diprivan) 200 mg IV  DESCRIPTION OF PROCEDURE:   After the risks benefits and alternatives of the procedure were thoroughly explained, informed consent was obtained.  Digital rectal exam was performed and revealed no rectal masses.   The LB [id number] endoscope was introduced through the anus and advanced to the cecum, which was identified by both the appendix and ileocecal valve, without limitations.  The quality of the prep was Moviprep fair.  The instrument was then slowly withdrawn as the colon was fully examined. <<PROCEDUREIMAGES>>  FINDINGS:  Mild diverticulosis was found in the left colon.  This was otherwise a normal examination of the colon.   Retroflexed views in the rectum revealed no abnormalities.   The scope was then withdrawn from the cecum and the procedure completed. COMPLICATIONS:  None  ENDOSCOPIC IMPRESSION: 1) Mild diverticulosis in the left colon 2) Otherwise normal examination  RECOMMENDATIONS: 1) High fiber diet. 2) You should continue to follow colorectal cancer screening guidelines for "routine risk" patients with a repeat colonoscopy in 10 years. There is no need for FOBT (stool) testing for at least 5 years.  Carie Caddy. Rhea Belton, MD  CC:  Rudi Heap, MD The Patient  n. eSIGNED:   Carie Caddy. Lanissa Cashen at 10/26/2011 10:09 AM  [place], [phone]

## 2011-10-27 ENCOUNTER — Telehealth: Payer: Self-pay | Admitting: *Deleted

## 2011-10-27 NOTE — Telephone Encounter (Signed)
  Follow up Call-  Call back number 10/26/2011  Post procedure Call Back phone  # [phone]  Permission to leave phone message Yes     Patient questions:  Do you have a fever, pain , or abdominal swelling? no Pain Score  0 *  Have you tolerated food without any problems? yes  Have you been able to return to your normal activities? yes  Do you have any questions about your discharge instructions: Diet   no Medications  no Follow up visit  no  Do you have questions or concerns about your Care? no  Actions: * If pain score is 4 or above: No action needed, pain <4.

## 2012-05-26 ENCOUNTER — Ambulatory Visit (INDEPENDENT_AMBULATORY_CARE_PROVIDER_SITE_OTHER): Payer: 59 | Admitting: Gynecology

## 2012-05-26 ENCOUNTER — Encounter: Payer: Self-pay | Admitting: Gynecology

## 2012-05-26 VITALS — BP 118/72 | Ht 63.0 in | Wt 221.0 lb

## 2012-05-26 DIAGNOSIS — N926 Irregular menstruation, unspecified: Secondary | ICD-10-CM

## 2012-05-26 DIAGNOSIS — Z01419 Encounter for gynecological examination (general) (routine) without abnormal findings: Secondary | ICD-10-CM

## 2012-05-26 DIAGNOSIS — N951 Menopausal and female climacteric states: Secondary | ICD-10-CM

## 2012-05-26 NOTE — Patient Instructions (Signed)
Try over-the-counter soy based products as discussed. Follow up if menopausal symptoms worsen to discuss alternatives.  Keep menstrual calendar. As long as less frequent but regular menses then followed. If prolonged or atypical bleeding or if you go over one year without a menses and then bleed you need to be evaluated.  Follow up in one year for annual gynecologic exam.

## 2012-05-26 NOTE — Progress Notes (Signed)
Heather Lam 1961/02/06 [id number]        51 y.o.  G2P2 for annual exam.  Several issues noted below.  Past medical history,surgical history, medications, allergies, family history and social history were all reviewed and documented in the EPIC chart. ROS:  Was performed and pertinent positives and negatives are included in the history.  Exam: Sherrilyn Rist assistant Filed Vitals:   05/26/12 1146  BP: 118/72  Height: 5\' 3"  (1.6 m)  Weight: 221 lb (100.245 kg)   General appearance  Normal Skin grossly normal Head/Neck normal with no cervical or supraclavicular adenopathy thyroid normal Lungs  clear Cardiac RR, without RMG Abdominal  soft, nontender, without masses, organomegaly or hernia Breasts  examined lying and sitting without masses, retractions, discharge or axillary adenopathy. Pelvic  Ext/BUS/vagina  normal   Cervix  normal   Uterus  axial, normal size, shape and contour, midline and mobile nontender   Adnexa  Without masses or tenderness    Anus and perineum  normal   Rectovaginal  normal sphincter tone without palpated masses or tenderness.    Assessment/Plan:  51 y.o. G2P2 female for annual exam, status post endometrial ablation/BTL.   1. Irregular menses/menopausal symptoms.  Over the past year the patient has had more irregular menses with skips up to 6 months. When she does have them they are regular in length and heaviness. Also having hot flashes and sweats.  Cardiologist does not want her to consider HRT at this time. Reviewed options to include OTC soy as well as pharmacologic nonhormonal such as Effexor. Patient is going to try OTC soy follow up if symptoms worsen/unacceptable. I reviewed general the risks of HRT to include thrombosis stroke heart attack DVT as well as breast cancer issues. Will keep menstrual calendar and as long as less frequent but regular menses then will monitor. If she bleeds after one year of no menses or has prolonged or atypical bleeding she knows  to follow up for evaluation. 2. Pap smear.  No Pap smear done today. Pap 2012 normal. No history of significant abnormal Pap smears. We'll plan less frequent screening every 3-5 years. 3. Mammography. Patient had mammogram 09/2011. We'll continue with annual mammography. SBE monthly reviewed. 4. Colonoscopy. Patient had 10/2011. We'll follow up with their recommended interval. 5. Health maintenance. No blood work done this is all done through her other physician's offices. Follow up one year, sooner as needed.    Dara Lords MD, 12:25 PM 05/26/2012

## 2012-05-27 LAB — URINALYSIS W MICROSCOPIC + REFLEX CULTURE
Bacteria, UA: NONE SEEN
Bilirubin Urine: NEGATIVE
Casts: NONE SEEN
Crystals: NONE SEEN
Glucose, UA: NEGATIVE mg/dL
Ketones, ur: NEGATIVE mg/dL
Leukocytes, UA: NEGATIVE
Nitrite: NEGATIVE
Protein, ur: 100 mg/dL — AB
Specific Gravity, Urine: 1.028 (ref 1.005–1.030)
Squamous Epithelial / LPF: NONE SEEN
Urobilinogen, UA: 0.2 mg/dL (ref 0.0–1.0)
pH: 6 (ref 5.0–8.0)

## 2012-05-29 ENCOUNTER — Telehealth: Payer: Self-pay | Admitting: Gynecology

## 2012-05-29 LAB — URINE CULTURE: Colony Count: 75000

## 2012-05-29 MED ORDER — AMPICILLIN 500 MG PO CAPS: 500.0000 mg | ORAL_CAPSULE | Freq: Four times a day (QID) | ORAL | Status: DC

## 2012-05-29 NOTE — Telephone Encounter (Signed)
Tell patient that her urinalysis showed some microscopic blood and bacteria. I want to cover her with antibiotic and ask her to repeat the urinalysis after she finishes the antibiotic to make sure the blood in the urine clears.

## 2012-05-30 NOTE — Telephone Encounter (Signed)
Left message for pt to call.

## 2012-05-30 NOTE — Telephone Encounter (Signed)
Pt informed with the below note. 

## 2012-10-11 ENCOUNTER — Ambulatory Visit: Payer: Managed Care, Other (non HMO) | Attending: Orthopaedic Surgery | Admitting: Physical Therapy

## 2012-10-11 DIAGNOSIS — IMO0001 Reserved for inherently not codable concepts without codable children: Secondary | ICD-10-CM | POA: Insufficient documentation

## 2012-10-11 DIAGNOSIS — R5381 Other malaise: Secondary | ICD-10-CM | POA: Insufficient documentation

## 2012-10-11 DIAGNOSIS — M25569 Pain in unspecified knee: Secondary | ICD-10-CM | POA: Insufficient documentation

## 2012-10-11 DIAGNOSIS — M25669 Stiffness of unspecified knee, not elsewhere classified: Secondary | ICD-10-CM | POA: Insufficient documentation

## 2012-10-12 ENCOUNTER — Ambulatory Visit: Payer: Managed Care, Other (non HMO) | Admitting: Physical Therapy

## 2012-10-18 ENCOUNTER — Ambulatory Visit: Payer: Managed Care, Other (non HMO) | Admitting: *Deleted

## 2012-11-13 ENCOUNTER — Telehealth: Payer: Self-pay | Admitting: Nurse Practitioner

## 2013-01-22 NOTE — Telephone Encounter (Signed)
done

## 2013-05-01 ENCOUNTER — Other Ambulatory Visit: Payer: Self-pay | Admitting: Gynecology

## 2013-05-01 DIAGNOSIS — Z1231 Encounter for screening mammogram for malignant neoplasm of breast: Secondary | ICD-10-CM

## 2013-05-07 ENCOUNTER — Ambulatory Visit (HOSPITAL_COMMUNITY): Payer: PRIVATE HEALTH INSURANCE

## 2013-05-24 ENCOUNTER — Ambulatory Visit (HOSPITAL_COMMUNITY): Payer: 59

## 2013-06-05 ENCOUNTER — Ambulatory Visit: Payer: 59 | Admitting: Interventional Cardiology

## 2013-06-06 ENCOUNTER — Encounter: Payer: Self-pay | Admitting: Nurse Practitioner

## 2013-06-06 ENCOUNTER — Ambulatory Visit (INDEPENDENT_AMBULATORY_CARE_PROVIDER_SITE_OTHER): Payer: PRIVATE HEALTH INSURANCE | Admitting: Nurse Practitioner

## 2013-06-06 ENCOUNTER — Ambulatory Visit (INDEPENDENT_AMBULATORY_CARE_PROVIDER_SITE_OTHER): Payer: PRIVATE HEALTH INSURANCE

## 2013-06-06 ENCOUNTER — Encounter (INDEPENDENT_AMBULATORY_CARE_PROVIDER_SITE_OTHER): Payer: Self-pay

## 2013-06-06 VITALS — BP 122/73 | HR 71 | Temp 98.2°F | Ht 62.0 in | Wt 212.0 lb

## 2013-06-06 DIAGNOSIS — F411 Generalized anxiety disorder: Secondary | ICD-10-CM

## 2013-06-06 DIAGNOSIS — I10 Essential (primary) hypertension: Secondary | ICD-10-CM

## 2013-06-06 DIAGNOSIS — E559 Vitamin D deficiency, unspecified: Secondary | ICD-10-CM

## 2013-06-06 DIAGNOSIS — E785 Hyperlipidemia, unspecified: Secondary | ICD-10-CM

## 2013-06-06 DIAGNOSIS — Z87891 Personal history of nicotine dependence: Secondary | ICD-10-CM

## 2013-06-06 MED ORDER — ALPRAZOLAM 0.25 MG PO TABS: 0.2500 mg | ORAL_TABLET | Freq: Every evening | ORAL | Status: DC | PRN

## 2013-06-06 MED ORDER — ZOLPIDEM TARTRATE 10 MG PO TABS: 10.0000 mg | ORAL_TABLET | Freq: Every evening | ORAL | Status: DC | PRN

## 2013-06-06 MED ORDER — ROSUVASTATIN CALCIUM 10 MG PO TABS: 10.0000 mg | ORAL_TABLET | ORAL | Status: DC

## 2013-06-06 MED ORDER — OLMESARTAN MEDOXOMIL-HCTZ 40-12.5 MG PO TABS: 1.0000 | ORAL_TABLET | Freq: Every day | ORAL | Status: DC

## 2013-06-06 MED ORDER — ESCITALOPRAM OXALATE 20 MG PO TABS: 20.0000 mg | ORAL_TABLET | Freq: Every day | ORAL | Status: DC

## 2013-06-06 NOTE — Patient Instructions (Signed)

## 2013-06-06 NOTE — Progress Notes (Signed)
Subjective:    Patient ID: Heather Lam, female    DOB: 1961-07-30, 52 y.o.   MRN: [id number]  Hypertension This is a chronic problem. The current episode started more than 1 year ago. The problem has been resolved since onset. The problem is controlled. Associated symptoms include anxiety. Pertinent negatives include no blurred vision, chest pain, palpitations, peripheral edema or shortness of breath. Risk factors for coronary artery disease include dyslipidemia and family history. Past treatments include diuretics and angiotensin blockers. The current treatment provides moderate improvement. There is no history of a thyroid problem. Identifiable causes of hypertension include sleep apnea.  Hyperlipidemia This is a chronic problem. The current episode started more than 1 year ago. The problem is controlled. Recent lipid tests were reviewed and are normal. She has no history of diabetes or hypothyroidism. Factors aggravating her hyperlipidemia include fatty foods. Pertinent negatives include no chest pain or shortness of breath. Current antihyperlipidemic treatment includes statins. The current treatment provides moderate improvement of lipids. Risk factors for coronary artery disease include dyslipidemia, hypertension, obesity and post-menopausal.  Anxiety Presents for follow-up visit. Symptoms include insomnia. Patient reports no chest pain, excessive worry, nervous/anxious behavior, palpitations, panic or shortness of breath. Symptoms occur occasionally. The severity of symptoms is moderate. The quality of sleep is good. Nighttime awakenings: occasional.   There is no history of depression. Past treatments include benzodiazephines and SSRIs. The treatment provided moderate relief. Compliance with prior treatments has been good.      Review of Systems  Eyes: Negative for blurred vision.  Respiratory: Negative for shortness of breath.   Cardiovascular: Negative for chest pain and palpitations.   Psychiatric/Behavioral: The patient has insomnia. The patient is not nervous/anxious.        Objective:   Physical Exam  Vitals reviewed. Constitutional: She is oriented to person, place, and time. She appears well-developed and well-nourished.  HENT:  Head: Normocephalic.  Right Ear: External ear normal.  Left Ear: External ear normal.  Nose: Nose normal.  Mouth/Throat: Oropharynx is clear and moist.  Eyes: Pupils are equal, round, and reactive to light.  Neck: Normal range of motion. Neck supple.  Cardiovascular: Normal rate, regular rhythm, normal heart sounds and intact distal pulses.   No murmur heard. Pulmonary/Chest: Effort normal and breath sounds normal. No respiratory distress. She has no wheezes.  Abdominal: Soft. Bowel sounds are normal. She exhibits no distension. There is no tenderness.  Musculoskeletal: Normal range of motion. She exhibits no edema and no tenderness.  Neurological: She is alert and oriented to person, place, and time.  Skin: Skin is warm and dry.  Psychiatric: She has a normal mood and affect. Her behavior is normal. Judgment and thought content normal.   BP 122/73  Pulse 71  Temp(Src) 98.2 F (36.8 C) (Oral)  Ht 5\' 2"  (1.575 m)  Wt 212 lb (96.163 kg)  BMI 38.77 kg/m2  Chest x-ray- no acute findings- bronchitis changes-Preliminary reading by Paulene Floor, FNP  [facility]       Assessment & Plan:   1. Generalized anxiety disorder   2. HTN (hypertension)   3. Hyperlipidemia   4. Former smoker   5. Vitamin D deficiency disease    Orders Placed This Encounter  Procedures  . DG Chest 2 View    Standing Status: Future     Number of Occurrences: 1     Standing Expiration Date: 08/06/2014    Order Specific Question:  Reason for Exam (SYMPTOM  OR DIAGNOSIS REQUIRED)  Answer:  screening    Order Specific Question:  Is the patient pregnant?    Answer:  No    Order Specific Question:  Preferred imaging location?    Answer:  Internal  .  CMP14+EGFR  . NMR, lipoprofile  . Vit D  25 hydroxy (rtn osteoporosis monitoring)  . EKG 12-Lead   Meds ordered this encounter  Medications  . DISCONTD: ALPRAZolam (XANAX) 0.25 MG tablet    Sig: Take 0.25 mg by mouth at bedtime as needed for anxiety.  [name] DISCONTD: zolpidem (AMBIEN) 10 MG tablet    Sig: Take 10 mg by mouth at bedtime as needed for sleep.  [name] escitalopram (LEXAPRO) 20 MG tablet    Sig: Take 1 tablet (20 mg total) by mouth daily.    Dispense:  90 tablet    Refill:  1    Order Specific Question:  Supervising Provider    Answer:  Ernestina Penna [[id number]]  . zolpidem (AMBIEN) 10 MG tablet    Sig: Take 1 tablet (10 mg total) by mouth at bedtime as needed for sleep.    Dispense:  30 tablet    Refill:  1    Order Specific Question:  Supervising Provider    Answer:  Ernestina Penna [[id number]]  . rosuvastatin (CRESTOR) 10 MG tablet    Sig: Take 1 tablet (10 mg total) by mouth as directed.    Dispense:  90 tablet    Refill:  1    Order Specific Question:  Supervising Provider    Answer:  Ernestina Penna [[id number]]  . olmesartan-hydrochlorothiazide (BENICAR HCT) 40-12.5 MG per tablet    Sig: Take 1 tablet by mouth daily.    Dispense:  90 tablet    Refill:  1    Order Specific Question:  Supervising Provider    Answer:  Ernestina Penna [[id number]]  . ALPRAZolam (XANAX) 0.25 MG tablet    Sig: Take 1 tablet (0.25 mg total) by mouth at bedtime as needed for anxiety.    Dispense:  30 tablet    Refill:  1    Order Specific Question:  Supervising Provider    Answer:  Deborra Medina    Continue all meds Labs pending Diet and exercise encouraged Health maintenance reviewed Follow up in 6 months  Mary-Margaret Daphine Deutscher, FNP

## 2013-06-07 ENCOUNTER — Ambulatory Visit (HOSPITAL_COMMUNITY): Payer: PRIVATE HEALTH INSURANCE

## 2013-06-07 ENCOUNTER — Encounter: Payer: Self-pay | Admitting: Gynecology

## 2013-06-08 LAB — NMR, LIPOPROFILE
Cholesterol: 131 mg/dL (ref <200)
HDL Cholesterol by NMR: 50 mg/dL (ref >=40)
HDL Particle Number: 35.2 umol/L (ref >=30.5)
LDL Particle Number: 1165 nmol/L — ABNORMAL HIGH (ref <1000)
LDL Size: 20.6 nm (ref >20.5)
LDLC SERPL CALC-MCNC: 50 mg/dL (ref <100)
LP-IR Score: 80 — ABNORMAL HIGH (ref <=45)
Small LDL Particle Number: 544 nmol/L — ABNORMAL HIGH (ref <=527)
Triglycerides by NMR: 156 mg/dL — ABNORMAL HIGH (ref <150)

## 2013-06-08 LAB — CMP14+EGFR
ALT: 19 IU/L (ref 0–32)
AST: 15 IU/L (ref 0–40)
Albumin/Globulin Ratio: 1.8 (ref 1.1–2.5)
Albumin: 4.5 g/dL (ref 3.5–5.5)
Alkaline Phosphatase: 87 IU/L (ref 39–117)
BUN/Creatinine Ratio: 23 (ref 9–23)
BUN: 15 mg/dL (ref 6–24)
CO2: 30 mmol/L — ABNORMAL HIGH (ref 18–29)
Calcium: 9.7 mg/dL (ref 8.7–10.2)
Chloride: 97 mmol/L (ref 97–108)
Creatinine, Ser: 0.65 mg/dL (ref 0.57–1.00)
GFR calc Af Amer: 118 mL/min/{1.73_m2} (ref >59)
GFR calc non Af Amer: 102 mL/min/{1.73_m2} (ref >59)
Globulin, Total: 2.5 g/dL (ref 1.5–4.5)
Glucose: 94 mg/dL (ref 65–99)
Potassium: 4.9 mmol/L (ref 3.5–5.2)
Sodium: 141 mmol/L (ref 134–144)
Total Bilirubin: 0.3 mg/dL (ref 0.0–1.2)
Total Protein: 7 g/dL (ref 6.0–8.5)

## 2013-06-08 LAB — VITAMIN D 25 HYDROXY (VIT D DEFICIENCY, FRACTURES): Vit D, 25-Hydroxy: 44.7 ng/mL (ref 30.0–100.0)

## 2013-06-12 ENCOUNTER — Ambulatory Visit: Payer: 59 | Admitting: Interventional Cardiology

## 2013-06-20 ENCOUNTER — Ambulatory Visit (INDEPENDENT_AMBULATORY_CARE_PROVIDER_SITE_OTHER): Payer: PRIVATE HEALTH INSURANCE | Admitting: Interventional Cardiology

## 2013-06-20 ENCOUNTER — Encounter: Payer: Self-pay | Admitting: Interventional Cardiology

## 2013-06-20 VITALS — BP 146/72 | HR 66 | Ht 63.0 in | Wt 215.0 lb

## 2013-06-20 DIAGNOSIS — Z9861 Coronary angioplasty status: Secondary | ICD-10-CM

## 2013-06-20 DIAGNOSIS — I10 Essential (primary) hypertension: Secondary | ICD-10-CM

## 2013-06-20 DIAGNOSIS — I2510 Atherosclerotic heart disease of native coronary artery without angina pectoris: Secondary | ICD-10-CM

## 2013-06-20 DIAGNOSIS — E785 Hyperlipidemia, unspecified: Secondary | ICD-10-CM

## 2013-06-20 MED ORDER — CLOPIDOGREL BISULFATE 75 MG PO TABS: 75.0000 mg | ORAL_TABLET | Freq: Every day | ORAL | Status: DC

## 2013-06-20 MED ORDER — ASPIRIN 81 MG PO TABS: 81.0000 mg | ORAL_TABLET | Freq: Every day | ORAL | Status: DC

## 2013-06-20 NOTE — Patient Instructions (Signed)
Decrease aspirin to 81mg  daily  Start Plavix 75mg . An Rx has been sent to your pharmacy  Your physician wants you to follow-up in: 1 year You will receive a reminder letter in the mail two months in advance. If you don't receive a letter, please call our office to schedule the follow-up appointment.  Your physician discussed the importance of regular exercise and recommended that you start or continue a regular exercise program for good health.

## 2013-06-20 NOTE — Progress Notes (Signed)
Patient ID: NIXON SPARR, female   DOB: 1961/01/28, 52 y.o.   MRN: [id number]    [address] [place], [place]  [address] Phone: [phone] Fax:  [phone]  Date:  06/20/2013   ID:  [name], DOB May 25, 1961, MRN [id number]  PCP:  Rudi Heap, MD   ASSESSMENT:  1. CAD, without angina 2. Hyperlipidemia with total LDL particle number 1100 3. Hypertension under control  PLAN:  1. Plavix 75 mg per day and aspirin 81 mg per day. We have done the the patient's anxiety about being off the medication and the recent data suggesting that therapy for up to 3 years can prevent events. 2. Clinical followup in one year   SUBJECTIVE: Heather Lam is a 52 y.o. female who continues to smoke. She has increased stress at work. She denies chest pain and angina. She is not able to exercise. Her job as a cause for increase stress because of his understaffed. 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 Overall she feels her cardiac status is stable.   Wt Readings from Last 3 Encounters:  06/20/13 215 lb (97.523 kg)  06/06/13 212 lb (96.163 kg)  05/26/12 221 lb (100.245 kg)     Past Medical History  Diagnosis Date  . Skin cancer   . Anxiety   . CAD (coronary artery disease)   . Diverticulosis   . Hyperlipemia   . Hypertension   . Sleep apnea   . Heart attack 2010    Current Outpatient Prescriptions  Medication Sig Dispense Refill  . ALPRAZolam (XANAX) 0.25 MG tablet Take 0.25 mg by mouth 2 (two) times daily.      [name] aspirin 325 MG tablet Take 325 mg by mouth daily.      . Cholecalciferol (VITAMIN D) 1000 UNITS capsule Take 2,000 Units by mouth daily.       . Coenzyme Q10 (CO Q10) 100 MG CAPS Take 1 capsule by mouth daily.      [name] escitalopram (LEXAPRO) 20 MG tablet Take 1 tablet (20 mg total) by mouth daily.  90 tablet  1  . metoprolol (LOPRESSOR) 50 MG tablet Take 50 mg by mouth 2 (two) times daily.      [name]  olmesartan-hydrochlorothiazide (BENICAR HCT) 40-12.5 MG per tablet Take 1 tablet by mouth daily.  90 tablet  1  . Omega-3 Fatty Acids (FISH OIL) 1000 MG CAPS 2 (two) times daily before a meal. Take 2  Tabs bid      . zolpidem (AMBIEN) 10 MG tablet Take 1 tablet (10 mg total) by mouth at bedtime as needed for sleep.  30 tablet  1  . rosuvastatin (CRESTOR) 10 MG tablet Take 1 tablet (10 mg total) by mouth as directed.  90 tablet  1   No current facility-administered medications for this visit.    Allergies:   No Known Allergies  Social History:  The patient  reports that she has quit smoking. She has never used smokeless tobacco. She reports that she drinks alcohol. She reports that she does not use illicit drugs.   ROS:  Please see the history of present illness.   Tobacco use   All other systems reviewed and negative.   OBJECTIVE: VS:  BP 146/72  Pulse 66  Ht 5\' 3"  (1.6 m)  Wt 215 lb (97.523 kg)  BMI 38.09 kg/m2 Well nourished, well developed, in no acute distress, obese and continues to  gain weight HEENT: normal Neck: JVD flat. Carotid bruit no bruits  Cardiac:  normal S1, S2; RRR; no murmur Lungs:  clear to auscultation bilaterally, no wheezing, rhonchi or rales Abd: soft, nontender, no hepatomegaly Ext: Edema none. Pulses 2+ Skin: warm and dry Neuro:  CNs 2-12 intact, no focal abnormalities noted  EKG:  Normal       Signed, Darci Needle III, MD 06/20/2013 2:36 PM

## 2013-09-14 ENCOUNTER — Other Ambulatory Visit: Payer: Self-pay | Admitting: Nurse Practitioner

## 2013-09-17 NOTE — Telephone Encounter (Signed)
Last seen 06/06/13  MMM   If approved route to nurse to call into [facility]

## 2013-09-17 NOTE — Telephone Encounter (Signed)
Please call in ambien with 1 refills 

## 2013-09-18 NOTE — Telephone Encounter (Signed)
Refill called in to pharmacy

## 2013-10-01 ENCOUNTER — Other Ambulatory Visit: Payer: Self-pay | Admitting: Interventional Cardiology

## 2013-10-02 ENCOUNTER — Other Ambulatory Visit: Payer: Self-pay | Admitting: Interventional Cardiology

## 2013-10-03 NOTE — Telephone Encounter (Signed)
Not on med list. Is this ok? Thanks, MI

## 2013-11-01 ENCOUNTER — Other Ambulatory Visit: Payer: Self-pay | Admitting: Obstetrics and Gynecology

## 2013-11-01 DIAGNOSIS — Z1231 Encounter for screening mammogram for malignant neoplasm of breast: Secondary | ICD-10-CM

## 2013-11-07 ENCOUNTER — Ambulatory Visit (HOSPITAL_COMMUNITY)
Admission: RE | Admit: 2013-11-07 | Discharge: 2013-11-07 | Disposition: A | Discharge status: Home / Self Care | Payer: PRIVATE HEALTH INSURANCE | Source: Ambulatory Visit | Attending: Obstetrics and Gynecology | Admitting: Obstetrics and Gynecology

## 2013-11-07 DIAGNOSIS — Z1231 Encounter for screening mammogram for malignant neoplasm of breast: Secondary | ICD-10-CM

## 2014-01-07 ENCOUNTER — Other Ambulatory Visit: Payer: Self-pay | Admitting: Nurse Practitioner

## 2014-01-08 NOTE — Telephone Encounter (Signed)
Patient last seen in office on 06-06-13. Rx last filled on 11-07-13 for #30. Please advise on refill. If approved please route to Pool B so nurse can call in to pharmacy

## 2014-01-09 NOTE — Telephone Encounter (Signed)
Called in [place]

## 2014-01-09 NOTE — Telephone Encounter (Signed)
Please call in ambien with 1 refills 

## 2014-06-03 ENCOUNTER — Encounter: Payer: Self-pay | Admitting: Interventional Cardiology

## 2014-06-07 ENCOUNTER — Ambulatory Visit: Payer: PRIVATE HEALTH INSURANCE | Admitting: Interventional Cardiology

## 2014-06-12 ENCOUNTER — Other Ambulatory Visit: Payer: Self-pay | Admitting: Interventional Cardiology

## 2014-08-05 ENCOUNTER — Ambulatory Visit: Payer: PRIVATE HEALTH INSURANCE | Admitting: Interventional Cardiology

## 2014-09-12 ENCOUNTER — Ambulatory Visit: Payer: PRIVATE HEALTH INSURANCE | Admitting: Interventional Cardiology

## 2014-11-11 ENCOUNTER — Ambulatory Visit: Payer: PRIVATE HEALTH INSURANCE | Admitting: Interventional Cardiology

## 2015-01-20 ENCOUNTER — Ambulatory Visit: Payer: PRIVATE HEALTH INSURANCE | Admitting: Interventional Cardiology

## 2015-03-24 ENCOUNTER — Encounter: Payer: Self-pay | Admitting: Family

## 2015-03-24 ENCOUNTER — Ambulatory Visit (INDEPENDENT_AMBULATORY_CARE_PROVIDER_SITE_OTHER): Payer: 59 | Admitting: Family

## 2015-03-24 VITALS — BP 114/70 | HR 57 | Temp 97.5°F | Ht 63.0 in | Wt 199.4 lb

## 2015-03-24 DIAGNOSIS — R319 Hematuria, unspecified: Secondary | ICD-10-CM | POA: Diagnosis not present

## 2015-03-24 DIAGNOSIS — Z780 Asymptomatic menopausal state: Secondary | ICD-10-CM | POA: Diagnosis not present

## 2015-03-24 LAB — POCT UA - MICROSCOPIC ONLY
Bacteria, U Microscopic: NEGATIVE
Crystals, Ur, HPF, POC: NEGATIVE
Mucus, UA: NEGATIVE
WBC, Ur, HPF, POC: NEGATIVE
Yeast, UA: NEGATIVE

## 2015-03-24 LAB — POCT URINALYSIS DIPSTICK
Bilirubin, UA: NEGATIVE
Glucose, UA: NEGATIVE
Ketones, UA: NEGATIVE
Leukocytes, UA: NEGATIVE
Nitrite, UA: NEGATIVE
Protein, UA: NEGATIVE
Spec Grav, UA: 1.02
Urobilinogen, UA: NEGATIVE
pH, UA: 6

## 2015-03-24 NOTE — Addendum Note (Signed)
Addended by: Earlene Plater on: 03/24/2015 10:25 AM   Modules accepted: Orders

## 2015-03-24 NOTE — Addendum Note (Signed)
Addended by: Earlene Plater on: 03/24/2015 10:26 AM   Modules accepted: Orders, SmartSet

## 2015-03-24 NOTE — Patient Instructions (Signed)

## 2015-03-24 NOTE — Progress Notes (Signed)
   Subjective:    Patient ID: Heather Lam, female    DOB: 1961-04-14, 54 y.o.   MRN: [id number]  Hematuria This is a new problem. The current episode started more than 1 month ago (on and off for 10 weeks). The problem has been waxing and waning since onset. She reports no clotting in her urine stream. Her pain is at a severity of 2/10. The pain is mild. She describes her urine color as dark red. Irritative symptoms do not include frequency, nocturia or urgency. Obstructive symptoms do not include incomplete emptying, an intermittent stream, a slower stream, straining or a weak stream. Associated symptoms include abdominal pain (pressure) and flank pain. Pertinent negatives include no chills, dysuria, fever, nausea or vomiting. There is no history of kidney stones.      Review of Systems  Constitutional: Negative.  Negative for fever and chills.  HENT: Negative.   Eyes: Negative.   Respiratory: Negative.  Negative for shortness of breath.   Cardiovascular: Negative.  Negative for palpitations.  Gastrointestinal: Positive for abdominal pain (pressure). Negative for nausea and vomiting.  Endocrine: Negative.   Genitourinary: Positive for hematuria and flank pain. Negative for dysuria, urgency, frequency, incomplete emptying and nocturia.  Musculoskeletal: Negative.   Neurological: Negative.  Negative for headaches.  Hematological: Negative.   Psychiatric/Behavioral: Negative.   All other systems reviewed and are negative.      Objective:   Physical Exam  Constitutional: She is oriented to person, place, and time. She appears well-developed and well-nourished. No distress.  HENT:  Head: Normocephalic and atraumatic.  Right Ear: External ear normal.  Left Ear: External ear normal.  Nose: Nose normal.  Mouth/Throat: Oropharynx is clear and moist.  Eyes: Pupils are equal, round, and reactive to light.  Neck: Normal range of motion. Neck supple. No thyromegaly present.    Cardiovascular: Normal rate, regular rhythm, normal heart sounds and intact distal pulses.   No murmur heard. Pulmonary/Chest: Effort normal and breath sounds normal. No respiratory distress. She has no wheezes.  Abdominal: Soft. Bowel sounds are normal. She exhibits no distension. There is no tenderness.  Musculoskeletal: Normal range of motion. She exhibits no edema or tenderness.  Neurological: She is alert and oriented to person, place, and time. She has normal reflexes. No cranial nerve deficit.  Skin: Skin is warm and dry.  Psychiatric: She has a normal mood and affect. Her behavior is normal. Judgment and thought content normal.  Vitals reviewed.   BP 114/70 mmHg  Pulse 57  Temp(Src) 97.5 F (36.4 C) (Oral)  Ht 5\' 3"  (1.6 m)  Wt 199 lb 6.4 oz (90.447 kg)  BMI 35.33 kg/m2  LMP 12/31/2012 (Approximate)       Assessment & Plan:  1. Blood in urine - POCT UA - Microscopic Only - POCT urinalysis dipstick - POCT CBC - Ambulatory referral to Urology  2. Hematuria - POCT CBC - Ambulatory referral to Urology  3. Post-menopause -Pt has been post menopause for last 2 years  Avoid caffeine, alcohol, or carbonated beverages Force fluids RTO as needed  [name], FNP

## 2015-03-25 LAB — CBC WITH DIFFERENTIAL/PLATELET
Basophils Absolute: 0 10*3/uL (ref 0.0–0.2)
Basos: 0 %
EOS (ABSOLUTE): 0.1 10*3/uL (ref 0.0–0.4)
Eos: 1 %
Hematocrit: 37.7 % (ref 34.0–46.6)
Hemoglobin: 12.4 g/dL (ref 11.1–15.9)
Immature Grans (Abs): 0 10*3/uL (ref 0.0–0.1)
Immature Granulocytes: 0 %
Lymphocytes Absolute: 2.8 10*3/uL (ref 0.7–3.1)
Lymphs: 28 %
MCH: 27.7 pg (ref 26.6–33.0)
MCHC: 32.9 g/dL (ref 31.5–35.7)
MCV: 84 fL (ref 79–97)
Monocytes Absolute: 0.5 10*3/uL (ref 0.1–0.9)
Monocytes: 5 %
Neutrophils Absolute: 6.5 10*3/uL (ref 1.4–7.0)
Neutrophils: 66 %
Platelets: 226 10*3/uL (ref 150–379)
RBC: 4.48 x10E6/uL (ref 3.77–5.28)
RDW: 15.1 % (ref 12.3–15.4)
WBC: 9.9 10*3/uL (ref 3.4–10.8)

## 2015-04-21 ENCOUNTER — Ambulatory Visit: Payer: PRIVATE HEALTH INSURANCE | Admitting: Interventional Cardiology

## 2015-04-22 ENCOUNTER — Ambulatory Visit: Payer: PRIVATE HEALTH INSURANCE | Admitting: Interventional Cardiology

## 2015-04-25 ENCOUNTER — Ambulatory Visit: Payer: 59

## 2015-04-30 ENCOUNTER — Encounter: Payer: Self-pay | Admitting: Physician Assistant

## 2015-04-30 ENCOUNTER — Ambulatory Visit (INDEPENDENT_AMBULATORY_CARE_PROVIDER_SITE_OTHER): Payer: 59 | Admitting: Physician Assistant

## 2015-04-30 VITALS — BP 109/67 | HR 58 | Temp 97.5°F | Ht 63.0 in | Wt 194.0 lb

## 2015-04-30 DIAGNOSIS — L918 Other hypertrophic disorders of the skin: Secondary | ICD-10-CM

## 2015-04-30 DIAGNOSIS — L089 Local infection of the skin and subcutaneous tissue, unspecified: Secondary | ICD-10-CM

## 2015-05-02 ENCOUNTER — Encounter: Payer: Self-pay | Admitting: Physician Assistant

## 2015-05-02 NOTE — Progress Notes (Signed)
Patient ID: Heather Lam, female   DOB: Apr 13, 1961, 54 y.o.   MRN: [id number]   54 y/o female presents with c/o new appearing and enlarging lesions on neck.   PE reveals multiple inflamed and irritated skin tags on neck. Lesions were treated with cryosurgery x 7.   F/U prn   Danyla Wattley A. Benjamin Stain PA-C

## 2015-05-16 ENCOUNTER — Other Ambulatory Visit (INDEPENDENT_AMBULATORY_CARE_PROVIDER_SITE_OTHER): Payer: 59

## 2015-05-16 ENCOUNTER — Other Ambulatory Visit: Payer: Self-pay | Admitting: *Deleted

## 2015-05-16 DIAGNOSIS — R5383 Other fatigue: Secondary | ICD-10-CM

## 2015-05-16 DIAGNOSIS — I10 Essential (primary) hypertension: Secondary | ICD-10-CM

## 2015-05-16 DIAGNOSIS — E785 Hyperlipidemia, unspecified: Secondary | ICD-10-CM

## 2015-05-16 NOTE — Progress Notes (Signed)
Lab only 

## 2015-05-17 LAB — CMP14+EGFR
ALT: 17 IU/L (ref 0–32)
AST: 18 IU/L (ref 0–40)
Albumin/Globulin Ratio: 1.8 (ref 1.1–2.5)
Albumin: 4.4 g/dL (ref 3.5–5.5)
Alkaline Phosphatase: 64 IU/L (ref 39–117)
BUN/Creatinine Ratio: 20 (ref 9–23)
BUN: 13 mg/dL (ref 6–24)
Bilirubin Total: 0.2 mg/dL (ref 0.0–1.2)
CO2: 25 mmol/L (ref 18–29)
Calcium: 9.7 mg/dL (ref 8.7–10.2)
Chloride: 99 mmol/L (ref 97–108)
Creatinine, Ser: 0.64 mg/dL (ref 0.57–1.00)
GFR calc Af Amer: 117 mL/min/{1.73_m2} (ref >59)
GFR calc non Af Amer: 101 mL/min/{1.73_m2} (ref >59)
Globulin, Total: 2.5 g/dL (ref 1.5–4.5)
Glucose: 95 mg/dL (ref 65–99)
Potassium: 4 mmol/L (ref 3.5–5.2)
Sodium: 142 mmol/L (ref 134–144)
Total Protein: 6.9 g/dL (ref 6.0–8.5)

## 2015-05-17 LAB — THYROID PANEL WITH TSH
Free Thyroxine Index: 1.9 (ref 1.2–4.9)
T3 Uptake Ratio: 33 % (ref 24–39)
T4, Total: 5.8 ug/dL (ref 4.5–12.0)
TSH: 2.36 u[IU]/mL (ref 0.450–4.500)

## 2015-05-17 LAB — LIPID PANEL
Chol/HDL Ratio: 3.2 ratio units (ref 0.0–4.4)
Cholesterol, Total: 115 mg/dL (ref 100–199)
HDL: 36 mg/dL — ABNORMAL LOW (ref >39)
LDL Calculated: 51 mg/dL (ref 0–99)
Triglycerides: 142 mg/dL (ref 0–149)
VLDL Cholesterol Cal: 28 mg/dL (ref 5–40)

## 2015-05-21 DIAGNOSIS — I10 Essential (primary) hypertension: Secondary | ICD-10-CM | POA: Insufficient documentation

## 2015-05-22 ENCOUNTER — Ambulatory Visit (INDEPENDENT_AMBULATORY_CARE_PROVIDER_SITE_OTHER): Payer: 59 | Admitting: Interventional Cardiology

## 2015-05-22 ENCOUNTER — Encounter: Payer: Self-pay | Admitting: Interventional Cardiology

## 2015-05-22 VITALS — BP 122/68 | HR 63 | Ht 63.0 in | Wt 189.1 lb

## 2015-05-22 DIAGNOSIS — E785 Hyperlipidemia, unspecified: Secondary | ICD-10-CM | POA: Diagnosis not present

## 2015-05-22 DIAGNOSIS — I10 Essential (primary) hypertension: Secondary | ICD-10-CM

## 2015-05-22 DIAGNOSIS — I25119 Atherosclerotic heart disease of native coronary artery with unspecified angina pectoris: Secondary | ICD-10-CM | POA: Diagnosis not present

## 2015-05-22 DIAGNOSIS — Z9861 Coronary angioplasty status: Secondary | ICD-10-CM

## 2015-05-22 DIAGNOSIS — I2510 Atherosclerotic heart disease of native coronary artery without angina pectoris: Secondary | ICD-10-CM

## 2015-05-22 NOTE — Progress Notes (Signed)
Cardiology Office Note   Date:  05/22/2015   ID:  Heather Lam, DOB Feb 11, 1961, MRN [id number]  PCP:  Redge Gainer, MD  Cardiologist:  Sinclair Grooms, MD   Chief Complaint  Patient presents with  . Coronary Artery Disease      History of Present Illness: Heather Lam is a 54 y.o. female who presents for coronary artery disease with prior acute inferior infarction 2010 and intermediate stenosis in the mid LAD. 2012 nuclear study negative for ischemia. Other problems include hyperlipidemia, hypertension, and anxiety disorder.  Heather Lam is doing well. She works every day. She is gradually losing weight. She has not resumed smoking. She has no lower extremity fatigue or discomfort. She has occasional palpitations.    Past Medical History  Diagnosis Date  . Skin cancer   . Anxiety   . CAD (coronary artery disease)   . Diverticulosis   . Hyperlipemia   . Hypertension   . Sleep apnea   . Heart attack ([place]) 2010    Past Surgical History  Procedure Laterality Date  . Appendectomy    . Coronary stent placement    . Back surgery    . Endometrial ablation  2005  . Tubal ligation  1991  . Carpal tunnel release Right   . Mohs surgery       Current Outpatient Prescriptions  Medication Sig Dispense Refill  . ALPRAZolam (XANAX) 0.25 MG tablet Take 0.25 mg by mouth as needed.     [name] aspirin 325 MG tablet Take 325 mg by mouth daily.    [name] b complex vitamins tablet Take 1 tablet by mouth daily.    . Cholecalciferol (VITAMIN D) 1000 UNITS capsule Take 2,000 Units by mouth daily.     . clopidogrel (PLAVIX) 75 MG tablet Take 1 tablet (75 mg total) by mouth daily. 30 tablet 1  . Coenzyme Q10 (CO Q10) 100 MG CAPS Take 1 capsule by mouth daily.    [name] escitalopram (LEXAPRO) 20 MG tablet Take 1 tablet (20 mg total) by mouth daily. 90 tablet 1  . hydrochlorothiazide (HYDRODIURIL) 25 MG tablet Take 25 mg by mouth daily.    [name] losartan-hydrochlorothiazide (HYZAAR) 100-25 MG per  tablet Take 1 tablet by mouth daily.    . metoprolol tartrate (LOPRESSOR) 25 MG tablet Take 25 mg by mouth 2 (two) times daily.    [name] NITROSTAT 0.4 MG SL tablet 1 TABLET UNDER TONGUE EVERY 5 MINUTES UP TO 3 TIMES FOR CHEST PAIN THEN CALL DR IF NO RELIEF 25 tablet 3  . Omega-3 Fatty Acids (FISH OIL) 1000 MG CAPS 2 (two) times daily before a meal. Take 2  Tabs bid    . rosuvastatin (CRESTOR) 10 MG tablet Take 10 mg by mouth every other day.    . zolpidem (AMBIEN) 10 MG tablet TAKE 1 TABLET AT BEDTIME AS NEEDED FOR SLEEP 30 tablet 0   No current facility-administered medications for this visit.    Allergies:   Review of patient's allergies indicates no known allergies.    Social History:  The patient  reports that she has quit smoking. She has never used smokeless tobacco. She reports that she drinks alcohol. She reports that she does not use illicit drugs.   Family History:  The patient's family history includes Arthritis in her father, sister, and sister; Cancer in her father; Dementia in her father; Diabetes in her paternal grandfather; Hyperlipidemia in her father, mother, sister, and sister; Hypertension in her father  and mother; Other in her sister. There is no history of Colon cancer.    ROS:  Please see the history of present illness.   Otherwise, review of systems are positive for occasional anxiety, history of kidney stone, cervical disc disease..   All other systems are reviewed and negative.    PHYSICAL EXAM: VS:  BP 122/68 mmHg  Pulse 63  Ht 5\' 3"  (1.6 m)  Wt 85.784 kg (189 lb 1.9 oz)  BMI 33.51 kg/m2  LMP 12/31/2012 (Approximate) , BMI Body mass index is 33.51 kg/(m^2). GEN: Well nourished, well developed, in no acute distress HEENT: normal Neck: no JVD, carotid bruits, or masses Cardiac: RRR.  There is no murmur, rub, or gallop. There is no edema. Respiratory:  clear to auscultation bilaterally, normal work of breathing. GI: soft, nontender, nondistended, + BS MS: no  deformity or atrophy Skin: warm and dry, no rash Neuro:  Strength and sensation are intact Psych: euthymic mood, full affect   EKG:  EKG is ordered today. The ekg reveals normal   Recent Labs: 05/16/2015: ALT 17; BUN 13; Creatinine, Ser 0.64; Potassium 4.0; Sodium 142; TSH 2.360    Lipid Panel    Component Value Date/Time   CHOL 115 05/16/2015 1239   CHOL 131 06/06/2013 1147   TRIG 142 05/16/2015 1239   TRIG 156* 06/06/2013 1147   HDL 36* 05/16/2015 1239   HDL 50 06/06/2013 1147   HDL 24* 10/07/2008 0500   CHOLHDL 3.2 05/16/2015 1239   CHOLHDL 6.1 10/07/2008 0500   VLDL 26 10/07/2008 0500   LDLCALC 51 05/16/2015 1239   LDLCALC 50 06/06/2013 1147   LDLCALC  10/07/2008 0500    96        Total Cholesterol/HDL:CHD Risk Coronary Heart Disease Risk Table                     Men   Women  1/2 Average Risk   3.4   3.3  Average Risk       5.0   4.4  2 X Average Risk   9.6   7.1  3 X Average Risk  23.4   11.0        Use the calculated Patient Ratio above and the CHD Risk Table to determine the patient's CHD Risk.        ATP III CLASSIFICATION (LDL):  <100     mg/dL   Optimal  100-129  mg/dL   Near or Above                    Optimal  130-159  mg/dL   Borderline  160-189  mg/dL   High  >190     mg/dL   Very High      Wt Readings from Last 3 Encounters:  05/22/15 85.784 kg (189 lb 1.9 oz)  04/30/15 87.998 kg (194 lb)  03/24/15 90.447 kg (199 lb 6.4 oz)      Other studies Reviewed: Additional studies/ records that were reviewed today include: Prior angiogram. The findings include 70% mid LAD at time of last cath. Negative nuclear following cath. Managed medically since that time..    ASSESSMENT AND PLAN:  1. CAD S/P percutaneous coronary angioplasty Asymptomatic  2. Essential hypertension Controlled  3. Hyperlipidemia Excellent with LDL less than 70 in September  4. Coronary artery disease involving native coronary artery of native heart with angina  pectoris ([place]) Residual LAD disease being followed clinically    Current  medicines are reviewed at length with the patient today.  The patient has the following concerns regarding medicines: None.  The following changes/actions have been instituted:    Stress Cardiolite to compare with most recent study from 4 years ago.  Aerobic activity  Report chest symptoms. If palpitations become more frequent, consider monitoring.  Labs/ tests ordered today include:  No orders of the defined types were placed in this encounter.     Disposition:   FU with HS in 1 year  Signed, Sinclair Grooms, MD  05/22/2015 9:51 AM    [address], [place], [place]  [address] Phone: [phone]; Fax: [phone]

## 2015-05-22 NOTE — Patient Instructions (Signed)
Medication Instructions:  Your physician recommends that you continue on your current medications as directed. Please refer to the Current Medication list given to you today.  Labwork: None ordered   Testing/Procedures: Your physician has requested that you have en exercise stress myoview. For further information please visit [email or web address]. Please follow instruction sheet, as given.   Follow-Up: Your physician wants you to follow-up in: 1 year with Dr.Smith You will receive a reminder letter in the mail two months in advance. If you don't receive a letter, please call our office to schedule the follow-up appointment.   Any Other Special Instructions Will Be Listed Below (If Applicable). Your physician discussed the importance of regular exercise and recommended that you start or continue a regular exercise program for good health.

## 2015-07-01 ENCOUNTER — Ambulatory Visit (INDEPENDENT_AMBULATORY_CARE_PROVIDER_SITE_OTHER): Payer: 59 | Admitting: Family Medicine

## 2015-07-01 ENCOUNTER — Telehealth (HOSPITAL_COMMUNITY): Payer: Self-pay | Admitting: *Deleted

## 2015-07-01 ENCOUNTER — Telehealth (HOSPITAL_COMMUNITY): Payer: Self-pay | Admitting: Radiology

## 2015-07-01 ENCOUNTER — Encounter: Payer: Self-pay | Admitting: Family Medicine

## 2015-07-01 VITALS — BP 110/49 | HR 63 | Temp 97.7°F | Ht 63.0 in | Wt 189.0 lb

## 2015-07-01 DIAGNOSIS — G4700 Insomnia, unspecified: Secondary | ICD-10-CM | POA: Diagnosis not present

## 2015-07-01 MED ORDER — TRAZODONE HCL 100 MG PO TABS: 50.0000 mg | ORAL_TABLET | Freq: Every day | ORAL | Status: DC

## 2015-07-01 NOTE — Telephone Encounter (Signed)
Left message on voicemail in reference to upcoming appointment scheduled for 07/03/15. Phone number given for a call back so details instructions can be given. Mavi Un, Ranae Palms

## 2015-07-01 NOTE — Patient Instructions (Signed)
Great to see you!   

## 2015-07-01 NOTE — Progress Notes (Signed)
   HPI  Patient presents today here to discuss difficulty sleeping.  Patient's platelet since menopause started she's had difficulty sleeping. Prophylaxis are improving. She's been using Ambien for about 2 years, she uses 5 mg on the nights before she works in 10 mg when she has a day off. She sleeps better with 10 mg but complains of grogginess, she also has grogginess with 5 mg. She also has had amnesia and sleepwalking events on 3 different occasions. She would like to investigate an alternative medicine for her sleep.  PMH: Smoking status noted ROS: Per HPI  Objective: BP 110/49 mmHg  Pulse 63  Temp(Src) 97.7 F (36.5 C) (Oral)  Ht 5\' 3"  (1.6 m)  Wt 189 lb (85.73 kg)  BMI 33.49 kg/m2  LMP 12/31/2012 (Approximate) Gen: NAD, alert, cooperative with exam HEENT: NCAT Ext: No edema, warm Neuro: Alert and oriented, No gross deficits  Assessment and plan:  # Insomnia, menopause related Discussed several options and decided to try trazodone. Gabapentin would also be reasonable option with hot flashes as well. I would recommend starting at 300 mg and going up to 600 or 900 for sedation, however am concerned that she could have grogginess with this as well. For now trial of trazodone, 5200 mg, discussed titration of the dose which we can work on     [employer] ordered this encounter  Medications  . traZODone (DESYREL) 100 MG tablet    Sig: Take 0.5-1 tablets (50-100 mg total) by mouth at bedtime.    Dispense:  30 tablet    Refill:  [place], MD [facility] 07/01/2015, 10:38 AM

## 2015-07-01 NOTE — Telephone Encounter (Signed)
Patient given detailed instructions per Myocardial Perfusion Study Information Sheet for the test on 07/03/2015 at 10:00. Patient notified to arrive 15 minutes early and that it is imperative to arrive on time for appointment to keep from having the test rescheduled.  If you need to cancel or reschedule your appointment, please call the office within 24 hours of your appointment. Failure to do so may result in a cancellation of your appointment, and a $50 no show fee. Patient verbalized understanding.EHK

## 2015-07-03 ENCOUNTER — Ambulatory Visit (HOSPITAL_COMMUNITY): Payer: 59 | Attending: Interventional Cardiology

## 2015-07-03 DIAGNOSIS — R002 Palpitations: Secondary | ICD-10-CM | POA: Insufficient documentation

## 2015-07-03 DIAGNOSIS — Z9861 Coronary angioplasty status: Secondary | ICD-10-CM | POA: Insufficient documentation

## 2015-07-03 DIAGNOSIS — I2510 Atherosclerotic heart disease of native coronary artery without angina pectoris: Secondary | ICD-10-CM | POA: Insufficient documentation

## 2015-07-03 DIAGNOSIS — R0609 Other forms of dyspnea: Secondary | ICD-10-CM | POA: Diagnosis not present

## 2015-07-03 DIAGNOSIS — I10 Essential (primary) hypertension: Secondary | ICD-10-CM | POA: Insufficient documentation

## 2015-07-03 LAB — MYOCARDIAL PERFUSION IMAGING
LV dias vol: 88 mL
LV sys vol: 22 mL
Peak HR: 88 {beats}/min
RATE: 0.23
Rest BP: 13773 mmHg
Rest HR: 65 {beats}/min
SDS: 3
SRS: 3
SSS: 6
TID: 0.95

## 2015-07-03 MED ORDER — TECHNETIUM TC 99M SESTAMIBI GENERIC - CARDIOLITE
10.7000 | Freq: Once | INTRAVENOUS | Status: AC | PRN
  Administered 2015-07-03: 11 via INTRAVENOUS

## 2015-07-03 MED ORDER — TECHNETIUM TC 99M SESTAMIBI GENERIC - CARDIOLITE
33.0000 | Freq: Once | INTRAVENOUS | Status: AC | PRN
  Administered 2015-07-03: 33 via INTRAVENOUS

## 2015-07-03 MED ORDER — REGADENOSON 0.4 MG/5ML IV SOLN
0.4000 mg | Freq: Once | INTRAVENOUS | Status: AC
  Administered 2015-07-03: 0.4 mg via INTRAVENOUS

## 2015-08-18 MED FILL — ROSUVASTATIN CALCIUM 10 MG: 10 | 90 days supply | Qty: 90 | Fill #1

## 2015-08-18 MED FILL — METOPROLOL TARTRATE 25 MG T: 25 | 90 days supply | Qty: 180 | Fill #1

## 2015-09-01 MED FILL — traZODone HCL 100 MG TABS: 100 | 30 days supply | Qty: 30 | Fill #2

## 2015-09-02 DIAGNOSIS — H5203 Hypermetropia, bilateral: Secondary | ICD-10-CM | POA: Diagnosis not present

## 2015-09-15 MED FILL — LOSARTAN-HCTZ 100-25 MG TAB: 100-25 | 90 days supply | Qty: 90 | Fill #2

## 2015-09-15 MED FILL — HYDROCHLOROTHIAZIDE 25 MG T: 25 | 90 days supply | Qty: 90 | Fill #2

## 2015-09-15 MED FILL — CLOPIDOGREL 75 MG TABLET: 75 | 90 days supply | Qty: 90 | Fill #2

## 2015-09-18 ENCOUNTER — Ambulatory Visit (INDEPENDENT_AMBULATORY_CARE_PROVIDER_SITE_OTHER): Payer: 59 | Admitting: *Deleted

## 2015-09-18 VITALS — BP 101/62 | HR 66 | Wt 196.2 lb

## 2015-09-18 DIAGNOSIS — I10 Essential (primary) hypertension: Secondary | ICD-10-CM

## 2015-09-18 NOTE — Progress Notes (Signed)
Patient is here today for a bp and weight check. Patients BP 101/62 mmHg  Pulse 66  Wt 196 lb 3.2 oz (88.996 kg)  LMP 12/31/2012 (Approximate). Patients weight last week was 198.7lb.

## 2015-09-22 MED FILL — ESCITALOPRAM 20 MG TABLET: 20 | 90 days supply | Qty: 90 | Fill #2

## 2015-09-29 MED FILL — traZODone HCL 100 MG TABS: 100 | 30 days supply | Qty: 30 | Fill #3

## 2015-10-09 ENCOUNTER — Other Ambulatory Visit: Payer: Self-pay | Admitting: Family

## 2015-10-09 ENCOUNTER — Other Ambulatory Visit: Payer: Self-pay

## 2015-10-09 ENCOUNTER — Other Ambulatory Visit: Payer: Self-pay | Admitting: Family Medicine

## 2015-10-09 DIAGNOSIS — Z1231 Encounter for screening mammogram for malignant neoplasm of breast: Secondary | ICD-10-CM

## 2015-10-09 MED ORDER — METRONIDAZOLE 500 MG PO TABS: 500.0000 mg | ORAL_TABLET | Freq: Two times a day (BID) | ORAL | Status: DC

## 2015-10-28 ENCOUNTER — Ambulatory Visit: Payer: 59

## 2015-11-03 ENCOUNTER — Other Ambulatory Visit: Payer: Self-pay | Admitting: *Deleted

## 2015-11-03 MED ORDER — TRAZODONE HCL 100 MG PO TABS: 50.0000 mg | ORAL_TABLET | Freq: Every day | ORAL | Status: DC

## 2015-11-03 MED ORDER — NITROGLYCERIN 0.4 MG SL SUBL: SUBLINGUAL_TABLET | SUBLINGUAL | Status: DC

## 2015-11-06 ENCOUNTER — Other Ambulatory Visit: Payer: Self-pay | Admitting: *Deleted

## 2015-11-06 MED ORDER — DOXYCYCLINE HYCLATE 100 MG PO TABS
100.0000 mg | ORAL_TABLET | Freq: Two times a day (BID) | ORAL | Status: DC
Start: 2015-11-06 — End: 2016-02-26

## 2015-11-07 ENCOUNTER — Other Ambulatory Visit: Payer: Self-pay | Admitting: *Deleted

## 2015-11-07 MED ORDER — FLUCONAZOLE 150 MG PO TABS: 150.0000 mg | ORAL_TABLET | Freq: Once | ORAL | Status: DC

## 2015-11-10 ENCOUNTER — Other Ambulatory Visit: Payer: Self-pay | Admitting: *Deleted

## 2015-11-10 MED ORDER — NALTREXONE-BUPROPION HCL ER 8-90 MG PO TB12: 8.0000 mg | ORAL_TABLET | Freq: Two times a day (BID) | ORAL | Status: DC

## 2015-11-12 MED FILL — CONTRAVE ER 8-90 MG TABLET: 8-90 | 30 days supply | Qty: 120 | Fill #0

## 2015-11-13 ENCOUNTER — Telehealth: Payer: Self-pay

## 2015-11-13 NOTE — Telephone Encounter (Signed)
Insurance prior authorized Contrave until 03/12/16

## 2015-12-01 MED FILL — METOPROLOL TARTRATE 25 MG T: 25 | 90 days supply | Qty: 180 | Fill #2

## 2015-12-08 ENCOUNTER — Telehealth: Payer: Self-pay

## 2015-12-08 MED ORDER — TRAZODONE HCL 100 MG PO TABS: 50.0000 mg | ORAL_TABLET | Freq: Every day | ORAL | Status: DC

## 2015-12-08 MED FILL — traZODone HCL 100 MG TABS: 100 | 90 days supply | Qty: 90 | Fill #0

## 2015-12-08 NOTE — Telephone Encounter (Signed)
Patient has been getting Trazodone 100 mg, 30 day supply, filled at [facility].  She would like to have a 90 day prescription sent to [facility].  Please advise.

## 2015-12-08 NOTE — Telephone Encounter (Signed)
I am glad she is getting good benefit from the medication.   Laroy Apple, MD [facility] 12/08/2015, 2:56 PM

## 2015-12-09 NOTE — Telephone Encounter (Signed)
Pt aware.

## 2015-12-16 MED FILL — HYDROCHLOROTHIAZIDE 25 MG T: 25 | 90 days supply | Qty: 90 | Fill #3

## 2015-12-16 MED FILL — LOSARTAN-HCTZ 100-25 MG TAB: 100-25 | 90 days supply | Qty: 90 | Fill #3

## 2015-12-16 MED FILL — CLOPIDOGREL 75 MG TABLET: 75 | 90 days supply | Qty: 90 | Fill #3

## 2015-12-16 MED FILL — ROSUVASTATIN CALCIUM 10 MG: 10 | 90 days supply | Qty: 90 | Fill #2

## 2015-12-16 MED FILL — ESCITALOPRAM 20 MG TABLET: 20 | 90 days supply | Qty: 90 | Fill #3

## 2016-01-14 ENCOUNTER — Telehealth: Payer: Self-pay | Admitting: *Deleted

## 2016-01-14 MED ORDER — NYSTATIN 100000 UNIT/ML MT SUSP: 5.0000 mL | Freq: Four times a day (QID) | OROMUCOSAL | Status: DC

## 2016-01-14 NOTE — Telephone Encounter (Signed)
Per [place] please call in Nystatin suspension for thrush.

## 2016-02-16 ENCOUNTER — Ambulatory Visit: Payer: 59

## 2016-02-25 ENCOUNTER — Ambulatory Visit (INDEPENDENT_AMBULATORY_CARE_PROVIDER_SITE_OTHER): Payer: 59 | Admitting: Family

## 2016-02-25 ENCOUNTER — Encounter: Payer: Self-pay | Admitting: Family

## 2016-02-25 VITALS — BP 101/62 | HR 66 | Temp 97.5°F | Ht 63.0 in | Wt 207.0 lb

## 2016-02-25 DIAGNOSIS — R5383 Other fatigue: Secondary | ICD-10-CM

## 2016-02-25 DIAGNOSIS — A499 Bacterial infection, unspecified: Secondary | ICD-10-CM | POA: Diagnosis not present

## 2016-02-25 DIAGNOSIS — E669 Obesity, unspecified: Secondary | ICD-10-CM | POA: Diagnosis not present

## 2016-02-25 DIAGNOSIS — N898 Other specified noninflammatory disorders of vagina: Secondary | ICD-10-CM

## 2016-02-25 DIAGNOSIS — N9489 Other specified conditions associated with female genital organs and menstrual cycle: Secondary | ICD-10-CM

## 2016-02-25 DIAGNOSIS — N760 Acute vaginitis: Secondary | ICD-10-CM

## 2016-02-25 DIAGNOSIS — B9689 Other specified bacterial agents as the cause of diseases classified elsewhere: Secondary | ICD-10-CM

## 2016-02-25 DIAGNOSIS — E785 Hyperlipidemia, unspecified: Secondary | ICD-10-CM

## 2016-02-25 DIAGNOSIS — I10 Essential (primary) hypertension: Secondary | ICD-10-CM

## 2016-02-25 LAB — URINALYSIS, COMPLETE
Bilirubin, UA: NEGATIVE
Glucose, UA: NEGATIVE
Ketones, UA: NEGATIVE
Leukocytes, UA: NEGATIVE
Nitrite, UA: NEGATIVE
Specific Gravity, UA: 1.025 (ref 1.005–1.030)
Urobilinogen, Ur: 0.2 mg/dL (ref 0.2–1.0)
pH, UA: 5.5 (ref 5.0–7.5)

## 2016-02-25 LAB — WET PREP FOR TRICH, YEAST, CLUE
Clue Cell Exam: NEGATIVE
Trichomonas Exam: NEGATIVE
Yeast Exam: NEGATIVE

## 2016-02-25 LAB — MICROSCOPIC EXAMINATION

## 2016-02-25 MED ORDER — METRONIDAZOLE 500 MG PO TABS: 500.0000 mg | ORAL_TABLET | Freq: Two times a day (BID) | ORAL | 0 refills | Status: DC

## 2016-02-25 NOTE — Patient Instructions (Signed)

## 2016-02-25 NOTE — Progress Notes (Signed)
   Subjective:    Patient ID: Heather Lam, female    DOB: 1961-05-27, 55 y.o.   MRN: [id number]  Back Pain  This is a new problem. The current episode started in the past 7 days. The problem has been gradually worsening since onset. Associated symptoms include abdominal pain. Pertinent negatives include no dysuria or headaches.  Abdominal Pain  This is a new problem. The current episode started in the past 7 days. The onset quality is gradual. The problem occurs constantly. The problem has been gradually worsening. The pain is located in the suprapubic region. The pain is at a severity of 5/10. The pain is mild. The quality of the pain is cramping. Associated symptoms include arthralgias and myalgias. Pertinent negatives include no belching, constipation, diarrhea, dysuria, frequency, headaches, hematuria, nausea or vomiting. Nothing aggravates the pain. The pain is relieved by nothing. She has tried nothing for the symptoms. The treatment provided no relief.      Review of Systems  Constitutional: Negative.   HENT: Negative.   Eyes: Negative.   Respiratory: Negative.  Negative for shortness of breath.   Cardiovascular: Negative.  Negative for palpitations.  Gastrointestinal: Positive for abdominal pain. Negative for constipation, diarrhea, nausea and vomiting.  Endocrine: Negative.   Genitourinary: Negative.  Negative for dysuria, frequency and hematuria.  Musculoskeletal: Positive for arthralgias, back pain and myalgias.  Neurological: Negative.  Negative for headaches.  Hematological: Negative.   Psychiatric/Behavioral: Negative.   All other systems reviewed and are negative.      Objective:   Physical Exam  Constitutional: She is oriented to person, place, and time. She appears well-developed and well-nourished. No distress.  HENT:  Head: Normocephalic and atraumatic.  Eyes: Pupils are equal, round, and reactive to light.  Neck: Normal range of motion. Neck supple. No  thyromegaly present.  Cardiovascular: Normal rate, regular rhythm, normal heart sounds and intact distal pulses.   No murmur heard. Pulmonary/Chest: Effort normal and breath sounds normal. No respiratory distress. She has no wheezes.  Abdominal: Soft. Bowel sounds are normal. She exhibits no distension. There is tenderness (mild lower abd tenderness).  Musculoskeletal: Normal range of motion. She exhibits no edema or tenderness.  Neurological: She is alert and oriented to person, place, and time.  Skin: Skin is warm and dry.  Psychiatric: She has a normal mood and affect. Her behavior is normal. Judgment and thought content normal.  Vitals reviewed.     BP 101/62   Pulse 66   Temp 97.5 F (36.4 C) (Oral)   Ht 5\' 3"  (1.6 m)   Wt 207 lb (93.9 kg)   LMP 12/31/2012 (Approximate)   BMI 36.67 kg/m      Assessment & Plan:  1. Vaginal odor - Urinalysis, Complete - WET PREP FOR TRICH, YEAST, CLUE  2. Obesity (BMI 30-39.9)  3. BV (bacterial vaginosis) -Keep clean and dry -Start probiotic  -RTO prn  - metroNIDAZOLE (FLAGYL) 500 MG tablet; Take 1 tablet (500 mg total) by mouth 2 (two) times daily.  Dispense: 14 tablet; Refill: 0  [name], FNP

## 2016-02-26 ENCOUNTER — Encounter: Payer: Self-pay | Admitting: Family Medicine

## 2016-02-26 ENCOUNTER — Ambulatory Visit (INDEPENDENT_AMBULATORY_CARE_PROVIDER_SITE_OTHER): Payer: 59 | Admitting: Family Medicine

## 2016-02-26 DIAGNOSIS — G5602 Carpal tunnel syndrome, left upper limb: Secondary | ICD-10-CM | POA: Diagnosis not present

## 2016-02-26 DIAGNOSIS — G5600 Carpal tunnel syndrome, unspecified upper limb: Secondary | ICD-10-CM | POA: Insufficient documentation

## 2016-02-26 MED ORDER — METHYLPREDNISOLONE ACETATE 40 MG/ML IJ SUSP
40.0000 mg | Freq: Once | INTRAMUSCULAR | Status: AC
  Administered 2016-02-26: 40 mg via INTRAMUSCULAR

## 2016-02-26 NOTE — Progress Notes (Signed)
BP 110/70   Pulse 62   Temp 97 F (36.1 C) (Oral)   Ht 5\' 3"  (1.6 m)   Wt 207 lb (93.9 kg)   LMP 12/31/2012 (Approximate)   BMI 36.67 kg/m    Subjective:    Patient ID: Heather Lam, female    DOB: [birth year], 55 y.o.   MRN: [id number]  HPI: Heather Lam is a 55 y.o. female presenting on 02/26/2016 for carpal tunnal (left hand)   HPI Carpal tunnel Patient has had carpal tunnel for years but it is starting to flareup over the past 3 weeks. Especially in her left wrist. She is having some tingling and numbness going up into her thumb and first finger. She denies any fevers or chills or overlying erythema or warmth.  Relevant past medical, surgical, family and social history reviewed and updated as indicated. Interim medical history since our last visit reviewed. Allergies and medications reviewed and updated.  Review of Systems  Constitutional: Negative for chills and fever.  HENT: Negative for congestion, ear discharge and ear pain.   Eyes: Negative for redness and visual disturbance.  Respiratory: Negative for chest tightness and shortness of breath.   Cardiovascular: Negative for chest pain and leg swelling.  Genitourinary: Negative for difficulty urinating and dysuria.  Musculoskeletal: Positive for arthralgias. Negative for back pain and gait problem.  Skin: Negative for rash.  Neurological: Positive for numbness. Negative for light-headedness and headaches.  Psychiatric/Behavioral: Negative for agitation and behavioral problems.  All other systems reviewed and are negative.   Per HPI unless specifically indicated above     Medication List       Accurate as of 02/26/16  3:26 PM. Always use your most recent med list.          ALPRAZolam 0.25 MG tablet Commonly known as:  XANAX Take 0.25 mg by mouth as needed.   aspirin 325 MG tablet Take 325 mg by mouth daily.   b complex vitamins tablet Take 1 tablet by mouth daily.   clopidogrel 75 MG  tablet Commonly known as:  PLAVIX Take 1 tablet (75 mg total) by mouth daily.   Co Q10 100 MG Caps Take 1 capsule by mouth daily.   escitalopram 20 MG tablet Commonly known as:  LEXAPRO Take 1 tablet (20 mg total) by mouth daily.   Fish Oil 1000 MG Caps 2 (two) times daily before a meal. Take 2  Tabs bid   fluconazole 150 MG tablet Commonly known as:  DIFLUCAN Take 1 tablet (150 mg total) by mouth once. Repeat in 1 week.   hydrochlorothiazide 25 MG tablet Commonly known as:  HYDRODIURIL Take 25 mg by mouth daily.   losartan-hydrochlorothiazide 100-25 MG tablet Commonly known as:  HYZAAR Take 1 tablet by mouth daily.   metoprolol tartrate 25 MG tablet Commonly known as:  LOPRESSOR Take 25 mg by mouth 2 (two) times daily.   metroNIDAZOLE 500 MG tablet Commonly known as:  FLAGYL Take 1 tablet (500 mg total) by mouth 2 (two) times daily.   Naltrexone-Bupropion HCl ER 8-90 MG Tb12 Commonly known as:  CONTRAVE Take 8 mg by mouth 2 (two) times daily. Week 1: 1 tablet q d  Week 2: 1 tablet bid  Week 3: 2 tablets q am ,1 tablet q pm Week 4: 2 tablets bid Then 2 tablets BID   nitroGLYCERIN 0.4 MG SL tablet Commonly known as:  NITROSTAT 1 TABLET UNDER TONGUE EVERY 5 MINUTES UP TO 3 TIMES FOR  CHEST PAIN THEN CALL DR IF NO RELIEF   rosuvastatin 10 MG tablet Commonly known as:  CRESTOR Take 10 mg by mouth every other day.   traZODone 100 MG tablet Commonly known as:  DESYREL Take 0.5-1 tablets (50-100 mg total) by mouth at bedtime.   Vitamin D 1000 units capsule Take 2,000 Units by mouth daily.          Objective:    BP 110/70   Pulse 62   Temp 97 F (36.1 C) (Oral)   Ht 5\' 3"  (1.6 m)   Wt 207 lb (93.9 kg)   LMP 12/31/2012 (Approximate)   BMI 36.67 kg/m   Wt Readings from Last 3 Encounters:  02/26/16 207 lb (93.9 kg)  02/25/16 207 lb (93.9 kg)  09/18/15 196 lb 3.2 oz (89 kg)    Physical Exam  Constitutional: She is oriented to person, place, and time. She  appears well-developed and well-nourished. No distress.  Eyes: Conjunctivae and EOM are normal. Pupils are equal, round, and reactive to light.  Cardiovascular: Normal rate, regular rhythm, normal heart sounds and intact distal pulses.   No murmur heard. Pulmonary/Chest: Effort normal and breath sounds normal. No respiratory distress. She has no wheezes.  Musculoskeletal: Normal range of motion. She exhibits no edema.       Left wrist: She exhibits tenderness (Positive Tinel sign).  Neurological: She is alert and oriented to person, place, and time. Coordination normal.  Skin: Skin is warm and dry. No rash noted. She is not diaphoretic.  Psychiatric: She has a normal mood and affect. Her behavior is normal.  Nursing note and vitals reviewed.     Assessment & Plan:   Problem List Items Addressed This Visit      Nervous and Auditory   Carpal tunnel syndrome   Relevant Medications   methylPREDNISolone acetate (DEPO-MEDROL) injection 40 mg (Completed)    Other Visit Diagnoses   None.      Follow up plan: Return if symptoms worsen or fail to improve.  Counseling provided for all of the vaccine components No orders of the defined types were placed in this encounter.   Caryl Pina, MD [facility] 02/26/2016, 3:26 PM

## 2016-03-09 ENCOUNTER — Other Ambulatory Visit: Payer: Self-pay

## 2016-03-09 MED ORDER — METOPROLOL TARTRATE 25 MG PO TABS: 25.0000 mg | ORAL_TABLET | Freq: Two times a day (BID) | ORAL | 1 refills | Status: DC

## 2016-03-09 MED FILL — METOPROLOL TARTRATE 25 MG T: 25 | 90 days supply | Qty: 180 | Fill #0

## 2016-03-15 ENCOUNTER — Other Ambulatory Visit: Payer: Self-pay

## 2016-03-15 MED ORDER — HYDROCHLOROTHIAZIDE 25 MG PO TABS: 25.0000 mg | ORAL_TABLET | Freq: Every day | ORAL | 0 refills | Status: DC

## 2016-03-15 MED ORDER — CLOPIDOGREL BISULFATE 75 MG PO TABS: ORAL_TABLET | ORAL | 0 refills | Status: DC

## 2016-03-15 MED ORDER — ESCITALOPRAM OXALATE 20 MG PO TABS: 20.0000 mg | ORAL_TABLET | Freq: Every day | ORAL | 0 refills | Status: DC

## 2016-03-15 MED ORDER — LOSARTAN POTASSIUM-HCTZ 100-25 MG PO TABS: 1.0000 | ORAL_TABLET | Freq: Every day | ORAL | 0 refills | Status: DC

## 2016-03-15 MED FILL — HYDROCHLOROTHIAZIDE 25 MG T: 25 | 90 days supply | Qty: 90 | Fill #0

## 2016-03-15 MED FILL — ESCITALOPRAM 20 MG TABLET: 20 | 90 days supply | Qty: 90 | Fill #0

## 2016-03-15 MED FILL — LOSARTAN-HCTZ 100-25 MG TAB: 100-25 | 90 days supply | Qty: 90 | Fill #0

## 2016-03-15 MED FILL — CLOPIDOGREL 75 MG TABLET: 75 | 90 days supply | Qty: 90 | Fill #0

## 2016-05-04 DIAGNOSIS — I2510 Atherosclerotic heart disease of native coronary artery without angina pectoris: Secondary | ICD-10-CM

## 2016-05-04 DIAGNOSIS — G4730 Sleep apnea, unspecified: Secondary | ICD-10-CM | POA: Insufficient documentation

## 2016-05-04 DIAGNOSIS — F419 Anxiety disorder, unspecified: Secondary | ICD-10-CM | POA: Insufficient documentation

## 2016-05-04 DIAGNOSIS — K5790 Diverticulosis of intestine, part unspecified, without perforation or abscess without bleeding: Secondary | ICD-10-CM | POA: Insufficient documentation

## 2016-05-04 DIAGNOSIS — C4490 Unspecified malignant neoplasm of skin, unspecified: Secondary | ICD-10-CM | POA: Insufficient documentation

## 2016-05-04 HISTORY — DX: Atherosclerotic heart disease of native coronary artery without angina pectoris: I25.10

## 2016-05-05 ENCOUNTER — Ambulatory Visit (INDEPENDENT_AMBULATORY_CARE_PROVIDER_SITE_OTHER): Payer: 59 | Admitting: Physician Assistant

## 2016-05-05 ENCOUNTER — Encounter: Payer: Self-pay | Admitting: Physician Assistant

## 2016-05-05 VITALS — BP 108/65 | HR 62 | Temp 97.8°F | Ht 63.0 in | Wt 210.0 lb

## 2016-05-05 DIAGNOSIS — Z9989 Dependence on other enabling machines and devices: Secondary | ICD-10-CM

## 2016-05-05 DIAGNOSIS — G4733 Obstructive sleep apnea (adult) (pediatric): Secondary | ICD-10-CM

## 2016-05-05 NOTE — Patient Instructions (Signed)
Sleep Apnea  Sleep apnea is a sleep disorder characterized by abnormal pauses in breathing while you sleep. When your breathing pauses, the level of oxygen in your blood decreases. This causes you to move out of deep sleep and into light sleep. As a result, your quality of sleep is poor, and the system that carries your blood throughout your body (cardiovascular system) experiences stress. If sleep apnea remains untreated, the following conditions can develop:  High blood pressure (hypertension).  Coronary artery disease.  Inability to achieve or maintain an erection (impotence).  Impairment of your thought process (cognitive dysfunction). There are three types of sleep apnea: 1. Obstructive sleep apnea--Pauses in breathing during sleep because of a blocked airway. 2. Central sleep apnea--Pauses in breathing during sleep because the area of the brain that controls your breathing does not send the correct signals to the muscles that control breathing. 3. Mixed sleep apnea--A combination of both obstructive and central sleep apnea. RISK FACTORS The following risk factors can increase your risk of developing sleep apnea:  Being overweight.  Smoking.  Having narrow passages in your nose and throat.  Being of older age.  Being female.  Alcohol use.  Sedative and tranquilizer use.  Ethnicity. Among individuals younger than 35 years, African Americans are at increased risk of sleep apnea. SYMPTOMS   Difficulty staying asleep.  Daytime sleepiness and fatigue.  Loss of energy.  Irritability.  Loud, heavy snoring.  Morning headaches.  Trouble concentrating.  Forgetfulness.  Decreased interest in sex.  Unexplained sleepiness. DIAGNOSIS  In order to diagnose sleep apnea, your caregiver will perform a physical examination. A sleep study done in the comfort of your own home may be appropriate if you are otherwise healthy. Your caregiver may also recommend that you spend the  night in a sleep lab. In the sleep lab, several monitors record information about your heart, lungs, and brain while you sleep. Your leg and arm movements and blood oxygen level are also recorded. TREATMENT The following actions may help to resolve mild sleep apnea:  Sleeping on your side.   Using a decongestant if you have nasal congestion.   Avoiding the use of depressants, including alcohol, sedatives, and narcotics.   Losing weight and modifying your diet if you are overweight. There also are devices and treatments to help open your airway:  Oral appliances. These are custom-made mouthpieces that shift your lower jaw forward and slightly open your bite. This opens your airway.  Devices that create positive airway pressure. This positive pressure "splints" your airway open to help you breathe better during sleep. The following devices create positive airway pressure:  Continuous positive airway pressure (CPAP) device. The CPAP device creates a continuous level of air pressure with an air pump. The air is delivered to your airway through a mask while you sleep. This continuous pressure keeps your airway open.  Nasal expiratory positive airway pressure (EPAP) device. The EPAP device creates positive air pressure as you exhale. The device consists of single-use valves, which are inserted into each nostril and held in place by adhesive. The valves create very little resistance when you inhale but create much more resistance when you exhale. That increased resistance creates the positive airway pressure. This positive pressure while you exhale keeps your airway open, making it easier to breath when you inhale again.  Bilevel positive airway pressure (BPAP) device. The BPAP device is used mainly in patients with central sleep apnea. This device is similar to the CPAP device because   it also uses an air pump to deliver continuous air pressure through a mask. However, with the BPAP machine, the  pressure is set at two different levels. The pressure when you exhale is lower than the pressure when you inhale.  Surgery. Typically, surgery is only done if you cannot comply with less invasive treatments or if the less invasive treatments do not improve your condition. Surgery involves removing excess tissue in your airway to create a wider passage way.   This information is not intended to replace advice given to you by your health care provider. Make sure you discuss any questions you have with your health care provider.   Document Released: 07/09/2002 Document Revised: 08/09/2014 Document Reviewed: 11/25/2011 Elsevier Interactive Patient Education 2016 Elsevier Inc.   

## 2016-05-05 NOTE — Progress Notes (Signed)
BP 108/65   Pulse 62   Temp 97.8 F (36.6 C) (Oral)   Ht 5\' 3"  (1.6 m)   Wt 210 lb (95.3 kg)   LMP 12/31/2012 (Approximate)   BMI 37.20 kg/m    Subjective:    Patient ID: Heather Lam, female    DOB: 1960-11-07, 55 y.o.   MRN: [id number]  HPI: Heather Lam is a 55 y.o. female presenting on 05/05/2016 for Sleep Apnea (Discuss sleep apnea due to patient needing new machine) Patient comes in to discuss her continued CPAP need. She is in need of new machine and supplies. She is compliant with her therapy. The instructions and orders have been sent to the home health agency. She reports feeling very good on the CPAP.  Original sleep study performed April 20, 2009.  Settings are good at this point.  Relevant past medical, surgical, family and social history reviewed and updated as indicated. Interim medical history since our last visit reviewed. Allergies and medications reviewed and updated. DATA REVIEWED: CHART IN EPIC  Social History   Social History  . Marital status: Married    Spouse name: N/A  . Number of children: N/A  . Years of education: N/A   Occupational History  . Not on file.   Social History Main Topics  . Smoking status: Former Research scientist (life sciences)  . Smokeless tobacco: Never Used  . Alcohol use Yes     Comment: Rare   . Drug use: No  . Sexual activity: Yes   Other Topics Concern  . Not on file   Social History Narrative   Caffeine daily     Past Surgical History:  Procedure Laterality Date  . APPENDECTOMY    . BACK SURGERY    . CARPAL TUNNEL RELEASE Right   . CORONARY STENT PLACEMENT    . ENDOMETRIAL ABLATION  2005  . MOHS SURGERY    . TUBAL LIGATION  1991    Family History  Problem Relation Age of Onset  . Hyperlipidemia Mother   . Hypertension Mother   . Cancer Father     skin  . Hyperlipidemia Father   . Dementia Father   . Arthritis Father   . Hypertension Father   . Other Sister     Myelofibrosis  . Arthritis Sister   .  Hyperlipidemia Sister   . Arthritis Sister   . Hyperlipidemia Sister   . Diabetes Paternal Grandfather   . Colon cancer Neg Hx     Review of Systems  Constitutional: Negative.  Negative for activity change, fatigue and fever.  HENT: Negative.   Eyes: Negative.   Respiratory: Negative.  Negative for cough.   Cardiovascular: Negative.  Negative for chest pain.  Gastrointestinal: Negative.  Negative for abdominal pain.  Endocrine: Negative.   Genitourinary: Negative.  Negative for dysuria.  Musculoskeletal: Negative.   Skin: Negative.   Neurological: Negative.       Medication List       Accurate as of 05/05/16  1:57 PM. Always use your most recent med list.          ALPRAZolam 0.25 MG tablet Commonly known as:  XANAX Take 0.25 mg by mouth as needed.   aspirin 325 MG tablet Take 325 mg by mouth daily.   b complex vitamins tablet Take 1 tablet by mouth daily.   clopidogrel 75 MG tablet Commonly known as:  PLAVIX Take 1 tablet (75 mg total) by mouth daily.   Co Q10 100 MG Caps  Take 1 capsule by mouth daily.   escitalopram 20 MG tablet Commonly known as:  LEXAPRO Take 1 tablet (20 mg total) by mouth daily.   Fish Oil 1000 MG Caps 2 (two) times daily before a meal. Take 2  Tabs bid   hydrochlorothiazide 25 MG tablet Commonly known as:  HYDRODIURIL Take 1 tablet (25 mg total) by mouth daily.   losartan-hydrochlorothiazide 100-25 MG tablet Commonly known as:  HYZAAR Take 1 tablet by mouth daily.   metoprolol tartrate 25 MG tablet Commonly known as:  LOPRESSOR Take 1 tablet (25 mg total) by mouth 2 (two) times daily.   nitroGLYCERIN 0.4 MG SL tablet Commonly known as:  NITROSTAT 1 TABLET UNDER TONGUE EVERY 5 MINUTES UP TO 3 TIMES FOR CHEST PAIN THEN CALL DR IF NO RELIEF   rosuvastatin 10 MG tablet Commonly known as:  CRESTOR Take 10 mg by mouth every other day.   traZODone 100 MG tablet Commonly known as:  DESYREL Take 0.5-1 tablets (50-100 mg total)  by mouth at bedtime.   Vitamin D 1000 units capsule Take 2,000 Units by mouth daily.          Objective:    BP 108/65   Pulse 62   Temp 97.8 F (36.6 C) (Oral)   Ht 5\' 3"  (1.6 m)   Wt 210 lb (95.3 kg)   LMP 12/31/2012 (Approximate)   BMI 37.20 kg/m   No Known Allergies  Wt Readings from Last 3 Encounters:  05/05/16 210 lb (95.3 kg)  02/26/16 207 lb (93.9 kg)  02/25/16 207 lb (93.9 kg)    Physical Exam  Constitutional: She is oriented to person, place, and time. She appears well-developed and well-nourished.  HENT:  Head: Normocephalic and atraumatic.  Eyes: Conjunctivae and EOM are normal. Pupils are equal, round, and reactive to light.  Cardiovascular: Normal rate, regular rhythm, normal heart sounds and intact distal pulses.   Pulmonary/Chest: Effort normal and breath sounds normal.  Abdominal: Soft. Bowel sounds are normal.  Neurological: She is alert and oriented to person, place, and time. She has normal reflexes.  Skin: Skin is warm and dry. No rash noted.  Psychiatric: She has a normal mood and affect. Her behavior is normal. Judgment and thought content normal.        Assessment & Plan:   1. OSA on CPAP Continue CPAP therapy at current settings. Order can be sent to home health agency  Continue all other maintenance medications as listed above.  Follow up plan: Return in about 6 months (around 11/03/2016).   Educational handout given for sleep apnea.  Terald Sleeper PA-C [facility] [address]  [place], [place] [address] [phone]   05/05/2016, 1:57 PM

## 2016-05-07 DIAGNOSIS — Z124 Encounter for screening for malignant neoplasm of cervix: Secondary | ICD-10-CM | POA: Diagnosis not present

## 2016-05-07 DIAGNOSIS — Z01419 Encounter for gynecological examination (general) (routine) without abnormal findings: Secondary | ICD-10-CM | POA: Diagnosis not present

## 2016-05-07 DIAGNOSIS — Z1231 Encounter for screening mammogram for malignant neoplasm of breast: Secondary | ICD-10-CM | POA: Diagnosis not present

## 2016-05-07 DIAGNOSIS — Z6837 Body mass index (BMI) 37.0-37.9, adult: Secondary | ICD-10-CM | POA: Diagnosis not present

## 2016-05-07 DIAGNOSIS — R222 Localized swelling, mass and lump, trunk: Secondary | ICD-10-CM | POA: Diagnosis not present

## 2016-05-10 DIAGNOSIS — Z124 Encounter for screening for malignant neoplasm of cervix: Secondary | ICD-10-CM | POA: Diagnosis not present

## 2016-05-20 DIAGNOSIS — G4733 Obstructive sleep apnea (adult) (pediatric): Secondary | ICD-10-CM | POA: Diagnosis not present

## 2016-05-24 ENCOUNTER — Ambulatory Visit (INDEPENDENT_AMBULATORY_CARE_PROVIDER_SITE_OTHER): Payer: 59 | Admitting: Physician Assistant

## 2016-05-24 VITALS — BP 123/70 | HR 72 | Temp 97.5°F | Ht 63.0 in | Wt 210.0 lb

## 2016-05-24 DIAGNOSIS — F3341 Major depressive disorder, recurrent, in partial remission: Secondary | ICD-10-CM | POA: Diagnosis not present

## 2016-05-24 DIAGNOSIS — F4320 Adjustment disorder, unspecified: Secondary | ICD-10-CM

## 2016-05-24 DIAGNOSIS — M791 Myalgia, unspecified site: Secondary | ICD-10-CM

## 2016-05-24 MED ORDER — MELOXICAM 7.5 MG PO TABS: 7.5000 mg | ORAL_TABLET | Freq: Every day | ORAL | 2 refills | Status: DC

## 2016-05-24 MED ORDER — METOPROLOL TARTRATE 25 MG PO TABS: 12.5000 mg | ORAL_TABLET | Freq: Two times a day (BID) | ORAL | 1 refills | Status: DC

## 2016-05-24 MED ORDER — DULOXETINE HCL 30 MG PO CPEP: 30.0000 mg | ORAL_CAPSULE | Freq: Every day | ORAL | 3 refills | Status: DC

## 2016-05-24 MED FILL — DULoxetine HCL 30 MG CPEP: 30 | 30 days supply | Qty: 60 | Fill #0

## 2016-05-24 MED FILL — MELOXICAM 7.5 MG TABLET: 7.5 | 30 days supply | Qty: 30 | Fill #0

## 2016-05-25 ENCOUNTER — Encounter: Payer: Self-pay | Admitting: Physician Assistant

## 2016-05-25 DIAGNOSIS — M791 Myalgia, unspecified site: Secondary | ICD-10-CM | POA: Insufficient documentation

## 2016-05-25 DIAGNOSIS — F4320 Adjustment disorder, unspecified: Secondary | ICD-10-CM | POA: Insufficient documentation

## 2016-05-25 DIAGNOSIS — F3341 Major depressive disorder, recurrent, in partial remission: Secondary | ICD-10-CM | POA: Insufficient documentation

## 2016-05-25 NOTE — Patient Instructions (Signed)
Myofascial Pain Syndrome and Fibromyalgia  Myofascial pain syndrome and fibromyalgia are both pain disorders. This pain may be felt mainly in your muscles.   · Myofascial pain syndrome:    Always has trigger points or tender points in the muscle that will cause pain when pressed. The pain may come and go.    Usually affects your neck, upper back, and shoulder areas. The pain often radiates into your arms and hands.  · Fibromyalgia:    Has muscle pains and tenderness that come and go.    Is often associated with fatigue and sleep disturbances.    Has trigger points.    Tends to be long-lasting (chronic), but is not life-threatening.  Fibromyalgia and myofascial pain are not the same. However, they often occur together. If you have both conditions, each can make the other worse. Both are common and can cause enough pain and fatigue to make day-to-day activities difficult.   CAUSES   The exact causes of fibromyalgia and myofascial pain are not known. People with certain gene types may be more likely to develop fibromyalgia. Some factors can be triggers for both conditions, such as:   · Spine disorders.  · Arthritis.  · Severe injury (trauma) and other physical stressors.  · Being under a lot of stress.  · A medical illness.  SIGNS AND SYMPTOMS   Fibromyalgia  The main symptom of fibromyalgia is widespread pain and tenderness in your muscles. This can vary over time. Pain is sometimes described as stabbing, shooting, or burning. You may have tingling or numbness, too. You may also have sleep problems and fatigue. You may wake up feeling tired and groggy (fibro fog). Other symptoms may include:   · Bowel and bladder problems.  · Headaches.  · Visual problems.  · Problems with odors and noises.  · Depression or mood changes.  · Painful menstrual periods (dysmenorrhea).  · Dry skin or eyes.  Myofascial pain syndrome  Symptoms of myofascial pain syndrome include:   · Tight, ropy bands of muscle.    · Uncomfortable  sensations in muscular areas, such as:    Aching.    Cramping.    Burning.    Numbness.    Tingling.      Muscle weakness.  · Trouble moving certain muscles freely (range of motion).  DIAGNOSIS   There are no specific tests to diagnose fibromyalgia or myofascial pain syndrome. Both can be hard to diagnose because their symptoms are common in many other conditions. Your health care provider may suspect one or both of these conditions based on your symptoms and medical history. Your health care provider will also do a physical exam.   The key to diagnosing fibromyalgia is having pain, fatigue, and other symptoms for more than three months that cannot be explained by another condition.   The key to diagnosing myofascial pain syndrome is finding trigger points in muscles that are tender and cause pain elsewhere in your body (referred pain).  TREATMENT   Treating fibromyalgia and myofascial pain often requires a team of health care providers. This usually starts with your primary provider and a physical therapist. You may also find it helpful to work with alternative health care providers, such as massage therapists or acupuncturists.  Treatment for fibromyalgia may include medicines. This may include nonsteroidal anti-inflammatory drugs (NSAIDs), along with other medicines.   Treatment for myofascial pain may also include:  · NSAIDs.  · Cooling and stretching of muscles.  · Trigger point injections.  ·   Sound wave (ultrasound) treatments to stimulate muscles.  HOME CARE INSTRUCTIONS   · Take medicines only as directed by your health care provider.  · Exercise as directed by your health care provider or physical therapist.  · Try to avoid stressful situations.  · Practice relaxation techniques to control your stress. You may want to try:    Biofeedback.    Visual imagery.    Hypnosis.    Muscle relaxation.    Yoga.    Meditation.  · Talk to your health care provider about alternative treatments, such as acupuncture or  massage treatment.  · Maintain a healthy lifestyle. This includes eating a healthy diet and getting enough sleep.  · Consider joining a support group.  · Do not do activities that stress or strain your muscles. That includes repetitive motions and heavy lifting.  SEEK MEDICAL CARE IF:   · You have new symptoms.  · Your symptoms get worse.  · You have side effects from your medicines.  · You have trouble sleeping.  · Your condition is causing depression or anxiety.  FOR MORE INFORMATION   · National Fibromyalgia Association: http://www.fmaware.orgwww.fmaware.org  · Arthritis Foundation: http://www.arthritis.orgwww.arthritis.org  · American Chronic Pain Association: http://www.theacpa.org/condition/myofascial-painwww.theacpa.org/condition/myofascial-pain     This information is not intended to replace advice given to you by your health care provider. Make sure you discuss any questions you have with your health care provider.     Document Released: 07/19/2005 Document Revised: 08/09/2014 Document Reviewed: 04/24/2014  Elsevier Interactive Patient Education ©2016 Elsevier Inc.

## 2016-05-25 NOTE — Progress Notes (Signed)
BP 123/70   Pulse 72   Temp 97.5 F (36.4 C) (Oral)   Ht 5\' 3"  (1.6 m)   Wt 210 lb (95.3 kg)   LMP 12/31/2012 (Approximate)   BMI 37.20 kg/m    Subjective:    Patient ID: Heather Lam, female    DOB: 1960/08/25, 55 y.o.   MRN: [id number]  HPI: Heather Lam is a 55 y.o. female presenting on 05/24/2016 for Generalized Body Aches (all the time. ) and Depression (Just never feels happy )     Relevant past medical, surgical, family and social history reviewed and updated as indicated. Interim medical history since our last visit reviewed. Allergies and medications reviewed and updated. DATA REVIEWED: CHART IN EPIC  Social History   Social History  . Marital status: Married    Spouse name: N/A  . Number of children: N/A  . Years of education: N/A   Occupational History  . Not on file.   Social History Main Topics  . Smoking status: Former Research scientist (life sciences)  . Smokeless tobacco: Never Used  . Alcohol use Yes     Comment: Rare   . Drug use: No  . Sexual activity: Yes   Other Topics Concern  . Not on file   Social History Narrative   Caffeine daily     Past Surgical History:  Procedure Laterality Date  . APPENDECTOMY    . BACK SURGERY    . CARPAL TUNNEL RELEASE Right   . CORONARY STENT PLACEMENT    . ENDOMETRIAL ABLATION  2005  . MOHS SURGERY    . TUBAL LIGATION  1991    Family History  Problem Relation Age of Onset  . Hyperlipidemia Mother   . Hypertension Mother   . Cancer Father     skin  . Hyperlipidemia Father   . Dementia Father   . Arthritis Father   . Hypertension Father   . Other Sister     Myelofibrosis  . Arthritis Sister   . Hyperlipidemia Sister   . Arthritis Sister   . Hyperlipidemia Sister   . Diabetes Paternal Grandfather   . Colon cancer Neg Hx     Review of Systems  Constitutional: Positive for fatigue. Negative for activity change, chills and fever.  HENT: Negative.   Eyes: Negative.   Respiratory: Negative.  Negative for  cough, chest tightness, shortness of breath and wheezing.   Cardiovascular: Negative.  Negative for chest pain, palpitations and leg swelling.  Gastrointestinal: Negative.  Negative for abdominal pain.  Endocrine: Negative.   Genitourinary: Negative.  Negative for dysuria.  Musculoskeletal: Positive for arthralgias, back pain, joint swelling, myalgias and neck stiffness.  Skin: Negative.  Negative for color change and rash.  Neurological: Negative.   Psychiatric/Behavioral: Positive for decreased concentration, dysphoric mood and sleep disturbance. Negative for suicidal ideas. The patient is nervous/anxious.       Medication List       Accurate as of 05/24/16 11:59 PM. Always use your most recent med list.          ALPRAZolam 0.25 MG tablet Commonly known as:  XANAX Take 0.25 mg by mouth as needed.   aspirin 325 MG tablet Take 325 mg by mouth daily.   b complex vitamins tablet Take 1 tablet by mouth daily.   clopidogrel 75 MG tablet Commonly known as:  PLAVIX Take 1 tablet (75 mg total) by mouth daily.   Co Q10 100 MG Caps Take 1 capsule by mouth daily.  DULoxetine 30 MG capsule Commonly known as:  CYMBALTA Take 1 capsule (30 mg total) by mouth daily. After 7 days take 2 daily   fexofenadine 180 MG tablet Commonly known as:  ALLEGRA Take 180 mg by mouth daily.   Fish Oil 1000 MG Caps 2 (two) times daily before a meal. Take 2  Tabs bid   losartan-hydrochlorothiazide 100-25 MG tablet Commonly known as:  HYZAAR Take 1 tablet by mouth daily.   meloxicam 7.5 MG tablet Commonly known as:  MOBIC Take 1 tablet (7.5 mg total) by mouth daily.   metoprolol tartrate 25 MG tablet Commonly known as:  LOPRESSOR Take 0.5 tablets (12.5 mg total) by mouth 2 (two) times daily.   nitroGLYCERIN 0.4 MG SL tablet Commonly known as:  NITROSTAT 1 TABLET UNDER TONGUE EVERY 5 MINUTES UP TO 3 TIMES FOR CHEST PAIN THEN CALL DR IF NO RELIEF   rosuvastatin 10 MG tablet Commonly  known as:  CRESTOR Take 10 mg by mouth every other day.   traZODone 100 MG tablet Commonly known as:  DESYREL Take 0.5-1 tablets (50-100 mg total) by mouth at bedtime.   vitamin C 1000 MG tablet Take 1,000 mg by mouth daily.   Vitamin D 1000 units capsule Take 2,000 Units by mouth daily.          Objective:    BP 123/70   Pulse 72   Temp 97.5 F (36.4 C) (Oral)   Ht 5\' 3"  (1.6 m)   Wt 210 lb (95.3 kg)   LMP 12/31/2012 (Approximate)   BMI 37.20 kg/m   No Known Allergies  Wt Readings from Last 3 Encounters:  05/24/16 210 lb (95.3 kg)  05/05/16 210 lb (95.3 kg)  02/26/16 207 lb (93.9 kg)    Physical Exam  Constitutional: Heather Lam is oriented to person, place, and time. Heather Lam appears well-developed and well-nourished.  HENT:  Head: Normocephalic and atraumatic.  Eyes: Conjunctivae and EOM are normal. Pupils are equal, round, and reactive to light.  Neck: Normal range of motion. Neck supple.  Cardiovascular: Normal rate, regular rhythm, normal heart sounds and intact distal pulses.   Pulmonary/Chest: Effort normal and breath sounds normal.  Abdominal: Soft. Bowel sounds are normal.  Musculoskeletal:  Positive for tenderness at the occipital prominences, trapezius muscles and the suprascapular region, paraspinal muscles in the thoracic, SI joint, left medial olecranon, popliteal left.  Neurological: Heather Lam is alert and oriented to person, place, and time. Heather Lam has normal reflexes.  Skin: Skin is warm and dry. No rash noted.  Psychiatric: Her speech is normal and behavior is normal. Judgment and thought content normal. Her mood appears anxious. Her affect is not angry. Heather Lam is not agitated and not slowed. Cognition and memory are normal. Heather Lam exhibits a depressed mood.        Assessment & Plan:   1. Adjustment disorder, unspecified type - DULoxetine (CYMBALTA) 30 MG capsule; Take 1 capsule (30 mg total) by mouth daily. After 7 days take 2 daily  Dispense: 60 capsule; Refill:  3  2. Myalgia - meloxicam (MOBIC) 7.5 MG tablet; Take 1 tablet (7.5 mg total) by mouth daily.  Dispense: 30 tablet; Refill: 2  3. Recurrent major depressive disorder, in partial remission (HCC) - DULoxetine (CYMBALTA) 30 MG capsule; Take 1 capsule (30 mg total) by mouth daily. After 7 days take 2 daily  Dispense: 60 capsule; Refill: 3   Continue all other maintenance medications as listed above.  Follow up plan: 4 weeks. PRN worsening or  change in symptoms.   Terald Sleeper PA-C [facility] [address]  [place], [place] [address] [phone]   05/25/2016, 8:18 AM

## 2016-06-02 ENCOUNTER — Other Ambulatory Visit: Payer: Self-pay | Admitting: *Deleted

## 2016-06-02 MED ORDER — TRAZODONE HCL 100 MG PO TABS: 50.0000 mg | ORAL_TABLET | Freq: Every day | ORAL | 3 refills | Status: DC

## 2016-06-02 MED FILL — traZODone HCL 100 MG TABS: 100 | 90 days supply | Qty: 90 | Fill #0

## 2016-06-03 ENCOUNTER — Encounter: Payer: Self-pay | Admitting: Physician Assistant

## 2016-06-03 ENCOUNTER — Ambulatory Visit (INDEPENDENT_AMBULATORY_CARE_PROVIDER_SITE_OTHER): Payer: 59 | Admitting: Physician Assistant

## 2016-06-03 VITALS — BP 105/62 | HR 59 | Temp 98.1°F | Ht 63.0 in | Wt 210.0 lb

## 2016-06-03 DIAGNOSIS — L03011 Cellulitis of right finger: Secondary | ICD-10-CM

## 2016-06-03 MED ORDER — CEPHALEXIN 500 MG PO CAPS: 500.0000 mg | ORAL_CAPSULE | Freq: Four times a day (QID) | ORAL | 0 refills | Status: DC

## 2016-06-03 NOTE — Progress Notes (Signed)
BP 105/62   Pulse (!) 59   Temp 98.1 F (36.7 C) (Oral)   Ht 5\' 3"  (1.6 m)   Wt 210 lb (95.3 kg)   LMP 12/31/2012 (Approximate)   BMI 37.20 kg/m    Subjective:    Patient ID: Heather Lam, female    DOB: 16-Nov-1960, 55 y.o.   MRN: [id number]  HPI: Heather Lam is a 55 y.o. female presenting on 06/03/2016 for infected finger (right middle finger )  Finger was cut very low when she went for a manicure about a week ago. Has stayed sore the whole time. However, in the past couple of days it has become swollen and tender. Denies pus. Has been warm.  Denies fever or chills.  Relevant past medical, surgical, family and social history reviewed and updated as indicated. Allergies and medications reviewed and updated.  Past Medical History:  Diagnosis Date  . Anxiety   . CAD (coronary artery disease)   . Diverticulosis   . Heart attack 2010  . Hyperlipemia   . Hypertension   . Skin cancer   . Sleep apnea     Past Surgical History:  Procedure Laterality Date  . APPENDECTOMY    . BACK SURGERY    . CARPAL TUNNEL RELEASE Right   . CORONARY STENT PLACEMENT    . ENDOMETRIAL ABLATION  2005  . MOHS SURGERY    . TUBAL LIGATION  1991    Review of Systems  Constitutional: Negative.   HENT: Negative.   Eyes: Negative.   Respiratory: Negative.   Gastrointestinal: Negative.   Skin: Positive for color change and wound.      Medication List       Accurate as of 06/03/16  2:09 PM. Always use your most recent med list.          ALPRAZolam 0.25 MG tablet Commonly known as:  XANAX Take 0.25 mg by mouth as needed.   aspirin 325 MG tablet Take 325 mg by mouth daily.   b complex vitamins tablet Take 1 tablet by mouth daily.   cephALEXin 500 MG capsule Commonly known as:  KEFLEX Take 1 capsule (500 mg total) by mouth 4 (four) times daily.   clopidogrel 75 MG tablet Commonly known as:  PLAVIX Take 1 tablet (75 mg total) by mouth daily.   Co Q10 100 MG Caps Take 1  capsule by mouth daily.   DULoxetine 30 MG capsule Commonly known as:  CYMBALTA Take 1 capsule (30 mg total) by mouth daily. After 7 days take 2 daily   fexofenadine 180 MG tablet Commonly known as:  ALLEGRA Take 180 mg by mouth daily.   Fish Oil 1000 MG Caps 2 (two) times daily before a meal. Take 2  Tabs bid   losartan-hydrochlorothiazide 100-25 MG tablet Commonly known as:  HYZAAR Take 1 tablet by mouth daily.   meloxicam 7.5 MG tablet Commonly known as:  MOBIC Take 1 tablet (7.5 mg total) by mouth daily.   metoprolol tartrate 25 MG tablet Commonly known as:  LOPRESSOR Take 0.5 tablets (12.5 mg total) by mouth 2 (two) times daily.   nitroGLYCERIN 0.4 MG SL tablet Commonly known as:  NITROSTAT 1 TABLET UNDER TONGUE EVERY 5 MINUTES UP TO 3 TIMES FOR CHEST PAIN THEN CALL DR IF NO RELIEF   rosuvastatin 10 MG tablet Commonly known as:  CRESTOR Take 10 mg by mouth every other day.   traZODone 100 MG tablet Commonly known as:  DESYREL Take  0.5-1 tablets (50-100 mg total) by mouth at bedtime.   vitamin C 1000 MG tablet Take 1,000 mg by mouth daily.   Vitamin D 1000 units capsule Take 2,000 Units by mouth daily.          Objective:    BP 105/62   Pulse (!) 59   Temp 98.1 F (36.7 C) (Oral)   Ht 5\' 3"  (1.6 m)   Wt 210 lb (95.3 kg)   LMP 12/31/2012 (Approximate)   BMI 37.20 kg/m   No Known Allergies  Physical Exam  Constitutional: She is oriented to person, place, and time. She appears well-developed and well-nourished.  HENT:  Head: Normocephalic and atraumatic.  Eyes: Conjunctivae and EOM are normal. Pupils are equal, round, and reactive to light.  Cardiovascular: Normal rate, regular rhythm, normal heart sounds and intact distal pulses.   Pulmonary/Chest: Effort normal and breath sounds normal.  Abdominal: Soft. Bowel sounds are normal.  Neurological: She is alert and oriented to person, place, and time. She has normal reflexes.  Skin: Skin is warm and  dry. Abrasion and lesion noted. No rash noted. Rash is not pustular. There is erythema.  Psychiatric: She has a normal mood and affect. Her behavior is normal. Judgment and thought content normal.        Assessment & Plan:   1. Paronychia of finger, right - cephALEXin (KEFLEX) 500 MG capsule; Take 1 capsule (500 mg total) by mouth 4 (four) times daily.  Dispense: 40 capsule; Refill: 0  Continue all other maintenance medications as listed above.  Follow up plan: Prn as needed  Educational handout given for cellulitis  Terald Sleeper PA-C [facility] [address]  [place], [place] [address] [phone]   06/03/2016, 2:09 PM

## 2016-06-07 ENCOUNTER — Other Ambulatory Visit: Payer: Self-pay | Admitting: *Deleted

## 2016-06-07 MED ORDER — CLOPIDOGREL BISULFATE 75 MG PO TABS: ORAL_TABLET | ORAL | 1 refills | Status: DC

## 2016-06-07 MED ORDER — LOSARTAN POTASSIUM-HCTZ 100-25 MG PO TABS: 1.0000 | ORAL_TABLET | Freq: Every day | ORAL | 1 refills | Status: DC

## 2016-06-07 MED ORDER — ROSUVASTATIN CALCIUM 10 MG PO TABS: 10.0000 mg | ORAL_TABLET | ORAL | 1 refills | Status: DC

## 2016-06-07 MED FILL — ROSUVASTATIN CALCIUM 10 MG: 10 | 90 days supply | Qty: 45 | Fill #0

## 2016-06-07 MED FILL — LOSARTAN-HCTZ 100-25 MG TAB: 100-25 | 90 days supply | Qty: 90 | Fill #0 | Status: TO

## 2016-06-07 MED FILL — CLOPIDOGREL 75 MG TABLET: 75 | 90 days supply | Qty: 90 | Fill #0 | Status: TO

## 2016-06-09 ENCOUNTER — Ambulatory Visit: Payer: 59 | Admitting: Cardiology

## 2016-06-09 ENCOUNTER — Other Ambulatory Visit: Payer: Self-pay

## 2016-06-09 DIAGNOSIS — G5602 Carpal tunnel syndrome, left upper limb: Secondary | ICD-10-CM

## 2016-06-14 ENCOUNTER — Ambulatory Visit: Payer: 59 | Admitting: Physician Assistant

## 2016-06-20 DIAGNOSIS — G4733 Obstructive sleep apnea (adult) (pediatric): Secondary | ICD-10-CM | POA: Diagnosis not present

## 2016-06-23 ENCOUNTER — Other Ambulatory Visit: Payer: Self-pay | Admitting: *Deleted

## 2016-06-23 DIAGNOSIS — F4320 Adjustment disorder, unspecified: Secondary | ICD-10-CM

## 2016-06-23 DIAGNOSIS — F3341 Major depressive disorder, recurrent, in partial remission: Secondary | ICD-10-CM

## 2016-06-23 MED ORDER — DULOXETINE HCL 30 MG PO CPEP: 60.0000 mg | ORAL_CAPSULE | Freq: Every day | ORAL | 3 refills | Status: DC

## 2016-06-23 MED FILL — DULoxetine HCL 30 MG CPEP: 30 | 90 days supply | Qty: 180 | Fill #0

## 2016-06-28 MED FILL — METOPROLOL TARTRATE 25 MG T: 25 | 90 days supply | Qty: 180 | Fill #1

## 2016-06-29 ENCOUNTER — Other Ambulatory Visit: Payer: Self-pay | Admitting: *Deleted

## 2016-06-29 ENCOUNTER — Encounter: Payer: Self-pay | Admitting: Cardiology

## 2016-06-29 DIAGNOSIS — E785 Hyperlipidemia, unspecified: Secondary | ICD-10-CM | POA: Diagnosis not present

## 2016-06-29 DIAGNOSIS — R5383 Other fatigue: Secondary | ICD-10-CM | POA: Diagnosis not present

## 2016-06-29 DIAGNOSIS — I10 Essential (primary) hypertension: Secondary | ICD-10-CM

## 2016-06-29 NOTE — Addendum Note (Signed)
Addended by: Liliane Bade on: 06/29/2016 08:22 AM   Modules accepted: Orders

## 2016-06-30 ENCOUNTER — Other Ambulatory Visit: Payer: 59

## 2016-06-30 ENCOUNTER — Other Ambulatory Visit: Payer: Self-pay | Admitting: *Deleted

## 2016-06-30 DIAGNOSIS — R739 Hyperglycemia, unspecified: Secondary | ICD-10-CM

## 2016-06-30 LAB — CMP14+EGFR
ALT: 19 IU/L (ref 0–32)
AST: 17 IU/L (ref 0–40)
Albumin/Globulin Ratio: 1.7 (ref 1.2–2.2)
Albumin: 4.4 g/dL (ref 3.5–5.5)
Alkaline Phosphatase: 71 IU/L (ref 39–117)
BUN/Creatinine Ratio: 25 — ABNORMAL HIGH (ref 9–23)
BUN: 15 mg/dL (ref 6–24)
Bilirubin Total: 0.3 mg/dL (ref 0.0–1.2)
CO2: 27 mmol/L (ref 18–29)
Calcium: 9.3 mg/dL (ref 8.7–10.2)
Chloride: 100 mmol/L (ref 96–106)
Creatinine, Ser: 0.59 mg/dL (ref 0.57–1.00)
GFR calc Af Amer: 119 mL/min/{1.73_m2} (ref >59)
GFR calc non Af Amer: 104 mL/min/{1.73_m2} (ref >59)
Globulin, Total: 2.6 g/dL (ref 1.5–4.5)
Glucose: 108 mg/dL — ABNORMAL HIGH (ref 65–99)
Potassium: 3.9 mmol/L (ref 3.5–5.2)
Sodium: 142 mmol/L (ref 134–144)
Total Protein: 7 g/dL (ref 6.0–8.5)

## 2016-06-30 LAB — BAYER DCA HB A1C WAIVED: HB A1C (BAYER DCA - WAIVED): 5.9 % (ref <7.0)

## 2016-06-30 LAB — THYROID PANEL WITH TSH
Free Thyroxine Index: 1.4 (ref 1.2–4.9)
T3 Uptake Ratio: 25 % (ref 24–39)
T4, Total: 5.6 ug/dL (ref 4.5–12.0)
TSH: 2.23 u[IU]/mL (ref 0.450–4.500)

## 2016-06-30 LAB — LIPID PANEL
Chol/HDL Ratio: 3.4 ratio units (ref 0.0–4.4)
Cholesterol, Total: 125 mg/dL (ref 100–199)
HDL: 37 mg/dL — ABNORMAL LOW (ref >39)
LDL Calculated: 51 mg/dL (ref 0–99)
Triglycerides: 183 mg/dL — ABNORMAL HIGH (ref 0–149)
VLDL Cholesterol Cal: 37 mg/dL (ref 5–40)

## 2016-07-05 NOTE — Progress Notes (Signed)
Cardiology Office Note   Date:  07/06/2016   ID:  Heather Lam, DOB [birth year], MRN [id number]  PCP:  Heather Sleeper, PA-C  Cardiologist:  Dr. Tamala Lam    Chief Complaint  Patient presents with  . Coronary Artery Disease      History of Present Illness: Heather Lam is a 55 y.o. female who presents for CAD.   She has a hx of coronary artery disease with prior acute inferior infarction 2010 stent to RCA and intermediate stenosis in the mid LAD treated medically.  2012 nuclear study negative for ischemia. Other problems include hyperlipidemia, hypertension, and anxiety disorder.  nuc study in 2016 with EF 74% normal study.  recent lipids LDL 51.  TG at 183.    She has sleep apnea on CPAP and uses. Nightly with nasal pillow.   Today she has no complaints. No chest pain no SOB. She plans to attend the gym she belongs to and she hopes her husband will join her.   She needs her NTG refilled but has not used. .    Past Medical History:  Diagnosis Date  . Anxiety   . CAD (coronary artery disease)   . Diverticulosis   . Heart attack 2010  . Hyperlipemia   . Hypertension   . Skin cancer   . Sleep apnea     Past Surgical History:  Procedure Laterality Date  . APPENDECTOMY    . BACK SURGERY    . CARPAL TUNNEL RELEASE Right   . CORONARY STENT PLACEMENT    . ENDOMETRIAL ABLATION  2005  . MOHS SURGERY    . TUBAL LIGATION  1991     Current Outpatient Prescriptions  Medication Sig Dispense Refill  . ALPRAZolam (XANAX) 0.25 MG tablet Take 0.25 mg by mouth as needed.     . Ascorbic Acid (VITAMIN C) 1000 MG tablet Take 1,000 mg by mouth daily.    [name] aspirin 325 MG tablet Take 325 mg by mouth daily.    [name] b complex vitamins tablet Take 1 tablet by mouth daily.    . Cholecalciferol (VITAMIN D) 1000 UNITS capsule Take 2,000 Units by mouth daily.     . clopidogrel (PLAVIX) 75 MG tablet Take 1 tablet (75 mg total) by mouth daily. 90 tablet 1  . Coenzyme Q10 (CO Q10) 100 MG CAPS Take  1 capsule by mouth daily.    . DULoxetine (CYMBALTA) 30 MG capsule Take 2 capsules (60 mg total) by mouth daily. 180 capsule 3  . losartan-hydrochlorothiazide (HYZAAR) 100-25 MG tablet Take 1 tablet by mouth daily. 90 tablet 1  . metoprolol tartrate (LOPRESSOR) 25 MG tablet Take 0.5 tablets (12.5 mg total) by mouth 2 (two) times daily. 180 tablet 1  . nitroGLYCERIN (NITROSTAT) 0.4 MG SL tablet 1 TABLET UNDER TONGUE EVERY 5 MINUTES UP TO 3 TIMES FOR CHEST PAIN THEN CALL DR IF NO RELIEF 25 tablet 3  . Omega-3 Fatty Acids (FISH OIL) 1000 MG CAPS 2 (two) times daily before a meal. Take 2  Tabs bid    . rosuvastatin (CRESTOR) 10 MG tablet Take 1 tablet (10 mg total) by mouth every other day. 90 tablet 1  . traZODone (DESYREL) 100 MG tablet Take 0.5-1 tablets (50-100 mg total) by mouth at bedtime. 90 tablet 3   No current facility-administered medications for this visit.     Allergies:   Patient has no known allergies.    Social History:  The patient  reports that she  has quit smoking. She has never used smokeless tobacco. She reports that she drinks alcohol. She reports that she does not use drugs.   Family History:  The patient's family history includes Arthritis in her father, sister, and sister; Cancer in her father; Dementia in her father; Diabetes in her paternal grandfather; Hyperlipidemia in her father, mother, sister, and sister; Hypertension in her father and mother; Other in her sister.    ROS:  General:no colds or fevers, no weight changes Skin:no rashes or ulcers HEENT:no blurred vision, no congestion CV:see HPI PUL:see HPI GI:no diarrhea constipation or melena, no indigestion GU:no hematuria, no dysuria MS:no joint pain, no claudication Neuro:no syncope, no lightheadedness Endo:no diabetes, no thyroid disease  Wt Readings from Last 3 Encounters:  07/06/16 220 lb 12.8 oz (100.2 kg)  06/03/16 210 lb (95.3 kg)  05/24/16 210 lb (95.3 kg)     PHYSICAL EXAM: VS:  BP 126/68    Pulse 70   Ht 5\' 3"  (1.6 m)   Wt 220 lb 12.8 oz (100.2 kg)   LMP 12/31/2012 (Approximate)   SpO2 94%   BMI 39.11 kg/m  , BMI Body mass index is 39.11 kg/m. General:Pleasant affect, NAD Skin:Warm and dry, brisk capillary refill HEENT:normocephalic, sclera clear, mucus membranes moist Neck:supple, no JVD, no bruits  Heart:S1S2 RRR without murmur, gallup, rub or click Lungs:clear without rales, rhonchi, or wheezes [id number], non tender, + BS, do not palpate liver spleen or masses Ext:no lower ext edema, 2+ pedal pulses, 2+ radial pulses Neuro:alert and oriented X 3, MAE, follows commands, + facial symmetry    EKG:  EKG is ordered today. The ekg ordered today demonstrates  SR at 61 no acute changes from 05/2015.     Recent Labs: 06/29/2016: ALT 19; BUN 15; Creatinine, Ser 0.59; Potassium 3.9; Sodium 142; TSH 2.230    Lipid Panel    Component Value Date/Time   CHOL 125 06/29/2016 0822   TRIG 183 (H) 06/29/2016 0822   TRIG 156 (H) 06/06/2013 1147   HDL 37 (L) 06/29/2016 0822   HDL 50 06/06/2013 1147   CHOLHDL 3.4 06/29/2016 0822   CHOLHDL 6.1 10/07/2008 0500   VLDL 26 10/07/2008 0500   LDLCALC 51 06/29/2016 0822   LDLCALC 50 06/06/2013 1147       Other studies Reviewed: Additional studies/ records that were reviewed today include:. nuc study 07/2015 Study Highlights    Nuclear stress EF: 74%.  There was no ST segment deviation noted during stress.  The study is normal.  The left ventricular ejection fraction is hyperdynamic (>65%).   Normal study, no ischemia or infarction.  Normal EF.       ASSESSMENT AND PLAN:   1. CAD S/P percutaneous coronary angioplasty Asymptomatic, EKG without changes and neg nuc a year ago. Follow up with Dr. Tamala Lam in 1 year unless problems prior to that time.     2. Essential hypertension Controlled  3. Hyperlipidemia Excellent with LDL 50 in November   4. Coronary artery disease involving native coronary artery of  native heart with angina pectoris ([place]) Residual LAD disease being followed clinically  Current medicines are reviewed with the patient today.  The patient Has no concerns regarding medicines.  The following changes have been made:  See above Labs/ tests ordered today include:see above  Disposition:   FU:  see above  Signed, Cecilie Kicks, NP  07/06/2016 10:22 AM    [facility], [place], [address]  [place], [place] Phone: [phone]; Fax: [phone]

## 2016-07-06 ENCOUNTER — Ambulatory Visit (INDEPENDENT_AMBULATORY_CARE_PROVIDER_SITE_OTHER): Payer: 59 | Admitting: Cardiology

## 2016-07-06 ENCOUNTER — Encounter: Payer: Self-pay | Admitting: Cardiology

## 2016-07-06 VITALS — BP 126/68 | HR 70 | Ht 63.0 in | Wt 220.8 lb

## 2016-07-06 DIAGNOSIS — I2510 Atherosclerotic heart disease of native coronary artery without angina pectoris: Secondary | ICD-10-CM | POA: Diagnosis not present

## 2016-07-06 DIAGNOSIS — I10 Essential (primary) hypertension: Secondary | ICD-10-CM

## 2016-07-06 DIAGNOSIS — Z9861 Coronary angioplasty status: Secondary | ICD-10-CM | POA: Diagnosis not present

## 2016-07-06 DIAGNOSIS — E782 Mixed hyperlipidemia: Secondary | ICD-10-CM

## 2016-07-06 MED ORDER — NITROGLYCERIN 0.4 MG SL SUBL: SUBLINGUAL_TABLET | SUBLINGUAL | 3 refills | Status: DC

## 2016-07-06 MED FILL — NITROGLYCERIN 0.4 MG TAB SL: 0.4 | 5 days supply | Qty: 25 | Fill #0

## 2016-07-06 NOTE — Patient Instructions (Addendum)
Medication Instructions:  Your physician recommends that you continue on your current medications as directed. Please refer to the Current Medication list given to you today.  Labwork: NONE  Testing/Procedures: NONE  Follow-Up: Your physician wants you to follow-up in: 12 months with Dr. Tamala Julian (Must Be Dr. Tamala Julian). You will receive a reminder letter in the mail two months in advance. If you don't receive a letter, please call our office to schedule the follow-up appointment.   If you need a refill on your cardiac medications before your next appointment, please call your pharmacy.

## 2016-07-12 DIAGNOSIS — M79642 Pain in left hand: Secondary | ICD-10-CM | POA: Diagnosis not present

## 2016-07-12 DIAGNOSIS — G5602 Carpal tunnel syndrome, left upper limb: Secondary | ICD-10-CM | POA: Diagnosis not present

## 2016-07-15 ENCOUNTER — Encounter (INDEPENDENT_AMBULATORY_CARE_PROVIDER_SITE_OTHER): Payer: 59 | Admitting: Family Medicine

## 2016-07-16 ENCOUNTER — Telehealth: Payer: Self-pay | Admitting: Interventional Cardiology

## 2016-07-16 ENCOUNTER — Encounter: Payer: Self-pay | Admitting: Cardiology

## 2016-07-16 NOTE — Telephone Encounter (Signed)
Request for surgical clearance:  1. What type of surgery is being performed? Left Wrist CTR   2. When is this surgery scheduled? Pending   3. Are there any medications that need to be held prior to surgery and how long? Plavix and ASA   4. Name of physician performing surgery? Dr. Amedeo Plenty   5. What is your office phone and fax number? Ph [phone] Fax [place]

## 2016-07-17 NOTE — Telephone Encounter (Signed)
Okay to stop aspirin 3 days before surgery and plavix up to 7 days prior to surgery. Resume when safe.

## 2016-07-19 NOTE — Telephone Encounter (Signed)
Routed to Dr. Vanetta Shawl office.

## 2016-07-20 DIAGNOSIS — G4733 Obstructive sleep apnea (adult) (pediatric): Secondary | ICD-10-CM | POA: Diagnosis not present

## 2016-07-28 ENCOUNTER — Ambulatory Visit (INDEPENDENT_AMBULATORY_CARE_PROVIDER_SITE_OTHER): Payer: 59 | Admitting: Family Medicine

## 2016-07-28 ENCOUNTER — Encounter (INDEPENDENT_AMBULATORY_CARE_PROVIDER_SITE_OTHER): Payer: Self-pay | Admitting: Family Medicine

## 2016-07-28 VITALS — BP 127/71 | HR 76 | Temp 98.2°F | Resp 14 | Ht 63.0 in | Wt 215.0 lb

## 2016-07-28 DIAGNOSIS — R5383 Other fatigue: Secondary | ICD-10-CM

## 2016-07-28 DIAGNOSIS — R0602 Shortness of breath: Secondary | ICD-10-CM

## 2016-07-28 DIAGNOSIS — Z6838 Body mass index (BMI) 38.0-38.9, adult: Secondary | ICD-10-CM | POA: Diagnosis not present

## 2016-07-28 DIAGNOSIS — Z1331 Encounter for screening for depression: Secondary | ICD-10-CM

## 2016-07-28 DIAGNOSIS — Z9189 Other specified personal risk factors, not elsewhere classified: Secondary | ICD-10-CM

## 2016-07-28 DIAGNOSIS — Z1389 Encounter for screening for other disorder: Secondary | ICD-10-CM

## 2016-07-28 DIAGNOSIS — R739 Hyperglycemia, unspecified: Secondary | ICD-10-CM

## 2016-07-28 DIAGNOSIS — E669 Obesity, unspecified: Secondary | ICD-10-CM

## 2016-07-28 DIAGNOSIS — Z0289 Encounter for other administrative examinations: Secondary | ICD-10-CM

## 2016-07-28 NOTE — Progress Notes (Signed)
Office: [phone]  /  Fax: [phone]   HPI:   Chief Complaint: OBESITY  Heather Lam (MR# [id number]) is a 55 y.o. female who presents on 07/28/2016 for obesity evaluation and treatment. Current BMI is Body mass index is 38.09 kg/m. Heather Lam has struggled with obesity for years and has been unsuccessful in either losing weight or maintaining long term weight loss. Heather Lam attended our information session and states she is currently in the action stage of change and ready to dedicate time achieving and maintaining a healthier weight.  Heather Lam states her family eats meals together she thinks her family will eat healthier with  her she struggles with family and or coworkers weight loss sabotage her desired weight is 150ish she has been heavy most of  her life she started gaining weight in 2000, worse in 2006  her heaviest weight ever was 232 lbs. she has significant food cravings issues  she snacks frequently in the evenings she skips meals frequently she frequently makes poor food choices she has problems with excessive hunger  she frequently eats larger portions than normal  she has binge eating behaviors she struggles with emotional eating    Fatigue Heather Lam feels her energy is lower than it should be. This has worsened with weight gain and has not worsened recently. Heather Lam admits to daytime somnolence and  denies waking up still tired. Patient is at risk for obstructive sleep apnea. Patent has a history of symptoms of daytime fatigue. Patient generally gets 6 hours of sleep per night, and states they generally have generally restful sleep. Snoring is present. Apneic episodes are present. Epworth Sleepiness Score is 7  Sleep Apnea Patient currently uses CPAP nightly, settings 16.  Dyspnea on exertion [name] notes increasing shortness of breath with exercising and seems to be worsening over time with weight gain. She notes getting out of breath sooner with activity than she used  to. This has not gotten worse recently. [name] denies orthopnea.  Hyperglycemia + polyphagia, at risk for DM  Depression Screen Heather Lam Food and Mood (modified PHQ-9) score was  Depression screen PHQ 2/9 07/28/2016  Decreased Interest 2  Down, Depressed, Hopeless 2  PHQ - 2 Score 4  Altered sleeping 1  Tired, decreased energy 2  Change in appetite 1  Feeling bad or failure about yourself  0  Trouble concentrating 0  Moving slowly or fidgety/restless 0  Suicidal thoughts 0  PHQ-9 Score 8  Difficult doing work/chores -    ALLERGIES: No Known Allergies  MEDICATIONS: Current Outpatient Prescriptions on File Prior to Visit  Medication Sig Dispense Refill   ALPRAZolam (XANAX) 0.25 MG tablet Take 0.25 mg by mouth as needed.      Ascorbic Acid (VITAMIN C) 1000 MG tablet Take 1,000 mg by mouth daily.     aspirin 325 MG tablet Take 325 mg by mouth daily.     b complex vitamins tablet Take 1 tablet by mouth daily.     Cholecalciferol (VITAMIN D) 1000 UNITS capsule Take 2,000 Units by mouth daily.      clopidogrel (PLAVIX) 75 MG tablet Take 1 tablet (75 mg total) by mouth daily. 90 tablet 1   Coenzyme Q10 (CO Q10) 100 MG CAPS Take 1 capsule by mouth daily.     DULoxetine (CYMBALTA) 30 MG capsule Take 2 capsules (60 mg total) by mouth daily. 180 capsule 3   losartan-hydrochlorothiazide (HYZAAR) 100-25 MG tablet Take 1 tablet by mouth daily. 90 tablet 1   metoprolol tartrate (LOPRESSOR)  25 MG tablet Take 0.5 tablets (12.5 mg total) by mouth 2 (two) times daily. 180 tablet 1   nitroGLYCERIN (NITROSTAT) 0.4 MG SL tablet 1 TABLET UNDER TONGUE EVERY 5 MINUTES UP TO 3 TIMES FOR CHEST PAIN THEN CALL DR IF NO RELIEF 25 tablet 3   Omega-3 Fatty Acids (FISH OIL) 1000 MG CAPS 1 capsule once. Take 1  Tabs qd     rosuvastatin (CRESTOR) 10 MG tablet Take 1 tablet (10 mg total) by mouth every other day. 90 tablet 1   traZODone (DESYREL) 100 MG tablet Take 0.5-1 tablets (50-100 mg total)  by mouth at bedtime. 90 tablet 3   No current facility-administered medications on file prior to visit.     PAST MEDICAL HISTORY: Past Medical History:  Diagnosis Date   Anxiety    Back pain    CAD (coronary artery disease)    Chest pain    Constipation    Diverticulosis    Heart attack 2010   Hyperlipemia    Hypertension    Joint pain    Skin cancer    Sleep apnea    Vitamin D deficiency     PAST SURGICAL HISTORY: Past Surgical History:  Procedure Laterality Date   APPENDECTOMY     BACK SURGERY     CARPAL TUNNEL RELEASE Right    CORONARY STENT PLACEMENT     ENDOMETRIAL ABLATION  2005   MOHS SURGERY     TUBAL LIGATION  1991    SOCIAL HISTORY: Social History  Substance Use Topics   Smoking status: Former Smoker   Smokeless tobacco: Never Used   Alcohol use Yes     Comment: Rare     FAMILY HISTORY: Family History  Problem Relation Age of Onset   Hyperlipidemia Mother    Hypertension Mother    Cancer Father     skin   Hyperlipidemia Father    Dementia Father    Arthritis Father    Hypertension Father    Sleep apnea Father    Other Sister     Myelofibrosis   Arthritis Sister    Hyperlipidemia Sister    Arthritis Sister    Hyperlipidemia Sister    Diabetes Paternal Grandfather    Colon cancer Neg Hx     ROS: Review of Systems  Constitutional: Positive for fever.  Eyes:       Wear Glasses or Contacts  Gastrointestinal: Positive for constipation.  Musculoskeletal: Positive for joint pain and myalgias.  Skin:       Dryness  Endo/Heme/Allergies: Bruises/bleeds easily.  Psychiatric/Behavioral: Positive for depression. The patient is nervous/anxious and has insomnia.        Stress    PHYSICAL EXAM: Blood pressure 127/71, pulse 76, temperature 98.2 F (36.8 C), temperature source Oral, resp. rate 14, height 5\' 3"  (1.6 m), weight 215 lb (97.5 kg), last menstrual period 12/31/2013, SpO2 98 %. Body mass index is  38.09 kg/m. Physical Exam  Constitutional: She is oriented to person, place, and time. She appears well-developed and well-nourished.  Neurological: She is oriented to person, place, and time.    RECENT LABS AND TESTS: BMET    Component Value Date/Time   NA 142 06/29/2016 0822   K 3.9 06/29/2016 0822   CL 100 06/29/2016 0822   CO2 27 06/29/2016 0822   GLUCOSE 108 (H) 06/29/2016 0822   GLUCOSE 104 (H) 10/06/2008 0545   BUN 15 06/29/2016 0822   CREATININE 0.59 06/29/2016 0822   CALCIUM 9.3 06/29/2016  [id number] 06/29/2016 0822   GFRAA 119 06/29/2016 0822   Lab Results  Component Value Date   HGBA1C  10/07/2008    5.6 (NOTE)   The ADA recommends the following therapeutic goal for glycemic   control related to Hgb A1C measurement:   Goal of Therapy:   < 7.0% Hgb A1C   Reference: American Diabetes Association: Clinical Practice   Recommendations 2008, Diabetes Care,  2008, 31:(Suppl 1).   No results found for: INSULIN CBC    Component Value Date/Time   WBC 9.9 03/24/2015 1026   WBC 10.9 (H) 10/06/2008 0545   RBC 4.48 03/24/2015 1026   RBC 4.25 10/06/2008 0545   HGB 12.9 10/06/2008 0545   HCT 37.7 03/24/2015 1026   PLT 226 03/24/2015 1026   MCV 84 03/24/2015 1026   MCH 27.7 03/24/2015 1026   MCHC 32.9 03/24/2015 1026   MCHC 34.8 10/06/2008 0545   RDW 15.1 03/24/2015 1026   LYMPHSABS 2.8 03/24/2015 1026   MONOABS 0.7 10/05/2008 1425   EOSABS 0.1 03/24/2015 1026   BASOSABS 0.0 03/24/2015 1026   Iron/TIBC/Ferritin/ %Sat No results found for: IRON, TIBC, FERRITIN, IRONPCTSAT Lipid Panel     Component Value Date/Time   CHOL 125 06/29/2016 0822   TRIG 183 (H) 06/29/2016 0822   TRIG 156 (H) 06/06/2013 1147   HDL 37 (L) 06/29/2016 0822   HDL 50 06/06/2013 1147   CHOLHDL 3.4 06/29/2016 0822   CHOLHDL 6.1 10/07/2008 0500   VLDL 26 10/07/2008 0500   LDLCALC 51 06/29/2016 0822   LDLCALC 50 06/06/2013 1147   Hepatic Function Panel     Component Value  Date/Time   PROT 7.0 06/29/2016 0822   ALBUMIN 4.4 06/29/2016 0822   AST 17 06/29/2016 0822   ALT 19 06/29/2016 0822   ALKPHOS 71 06/29/2016 0822   BILITOT 0.3 06/29/2016 0822     INDIRECT CALORIMETER done today shows a VO2 of 282 and a REE of 1963.    ASSESSMENT AND PLAN: Other fatigue - Plan: EKG 12-Lead, Comprehensive metabolic panel, CBC With Differential, Folate, VITAMIN D 25 Hydroxy (Vit-D Deficiency, Fractures), Vitamin B12  Shortness of breath - Plan: Lipid Panel With LDL/HDL Ratio  Hyperglycemia  At risk for diabetes mellitus - Plan: Hemoglobin A1c, Insulin, random  Depression screening  Class 2 obesity without serious comorbidity with body mass index (BMI) of 38.0 to 38.9 in adult, unspecified obesity type  PLAN:  Fatigue Heather Lam was informed that her fatigue may be related to obesity, depression or many other causes. Labs will be ordered, and in the meanwhile Heather Lam has agreed to work on diet, exercise and weight loss to help with fatigue. Proper sleep hygiene was discussed including the need for 7-8 hours of quality sleep each night. A sleep study was not ordered based on symptoms and Epworth score.  Exercise Induced Shortness of Breath Heather Lam's shortness of breath appears to be obesity related and exercise induced. She has agreed to work on weight loss and gradually increase exercise to treat her exercise induced shortness of breath. If Heather Lam follows our instructions and loses weight without improvement of her shortness of breath, we will plan to refer to pulmonology. We will monitor this condition regularly. Heather Lam agrees to this plan.  Diabetes risk counselling Heather Lam was given extended (at least 15 minutes) diabetes prevention counseling today. She is 55 y.o. female and has risk factors for diabetes including obesity. We discussed intensive lifestyle modifications today with an emphasis on weight  loss as well as increasing exercise and decreasing simple  carbohydrates in her diet.  Hyperglycemia Will check labs and follow.  Depression Screen Heather Lam had a negative depression screening. Depression is commonly associated with obesity and often results in emotional eating behaviors. We will monitor this closely and work on CBT to help improve the non-hunger eating patterns.  Obesity Heather Lam is currently in the action stage of change and her goal is to continue with weight loss efforts She has agreed to follow the Category 3 plan Heather Lam has been instructed to work up to a goal of 150 minutes of combined cardio and strengthening exercise per week for weight loss and overall health benefits. We discussed the following Behavioral Modification Stratagies today: increasing lean protein intake, increasing vegetables, work on meal planning and easy cooking plans and holiday eating strategies   Heather Lam has agreed to follow up with our clinic in 2 weeks. She was informed of the importance of frequent follow up visits to maximize her success with intensive lifestyle modifications for her multiple health conditions. She was informed we would discuss her lab results at her next visit unless there is a critical issue that needs to be addressed sooner. Heather Lam agreed to keep her next visit at the agreed upon time to discuss these results.  I, Doreene Nest, am acting as scribe for Dennard Nip, MD  I have reviewed the above documentation for accuracy and completeness, and I agree with the above. -Dennard Nip, MD

## 2016-07-29 LAB — LIPID PANEL WITH LDL/HDL RATIO
Cholesterol, Total: 133 mg/dL (ref 100–199)
HDL: 47 mg/dL (ref >39)
LDL Calculated: 57 mg/dL (ref 0–99)
LDl/HDL Ratio: 1.2 ratio units (ref 0.0–3.2)
Triglycerides: 143 mg/dL (ref 0–149)
VLDL Cholesterol Cal: 29 mg/dL (ref 5–40)

## 2016-07-29 LAB — HEMOGLOBIN A1C
Est. average glucose Bld gHb Est-mCnc: 126 mg/dL
Hgb A1c MFr Bld: 6 % — ABNORMAL HIGH (ref 4.8–5.6)

## 2016-07-29 LAB — FOLATE: Folate: 15.6 ng/mL (ref >3.0)

## 2016-07-29 LAB — CBC WITH DIFFERENTIAL
Basophils Absolute: 0.1 10*3/uL (ref 0.0–0.2)
Basos: 1 %
EOS (ABSOLUTE): 0.2 10*3/uL (ref 0.0–0.4)
Eos: 2 %
Hematocrit: 39.3 % (ref 34.0–46.6)
Hemoglobin: 13 g/dL (ref 11.1–15.9)
Immature Grans (Abs): 0 10*3/uL (ref 0.0–0.1)
Immature Granulocytes: 0 %
Lymphocytes Absolute: 2.9 10*3/uL (ref 0.7–3.1)
Lymphs: 28 %
MCH: 27.3 pg (ref 26.6–33.0)
MCHC: 33.1 g/dL (ref 31.5–35.7)
MCV: 82 fL (ref 79–97)
Monocytes Absolute: 0.5 10*3/uL (ref 0.1–0.9)
Monocytes: 5 %
Neutrophils Absolute: 6.6 10*3/uL (ref 1.4–7.0)
Neutrophils: 64 %
RBC: 4.77 x10E6/uL (ref 3.77–5.28)
RDW: 14.5 % (ref 12.3–15.4)
WBC: 10.2 10*3/uL (ref 3.4–10.8)

## 2016-07-29 LAB — VITAMIN D 25 HYDROXY (VIT D DEFICIENCY, FRACTURES): Vit D, 25-Hydroxy: 44.9 ng/mL (ref 30.0–100.0)

## 2016-07-29 LAB — INSULIN, RANDOM: INSULIN: 29.2 u[IU]/mL — ABNORMAL HIGH (ref 2.6–24.9)

## 2016-07-29 LAB — COMPREHENSIVE METABOLIC PANEL
ALT: 24 IU/L (ref 0–32)
AST: 19 IU/L (ref 0–40)
Albumin/Globulin Ratio: 1.6 (ref 1.2–2.2)
Albumin: 4.5 g/dL (ref 3.5–5.5)
Alkaline Phosphatase: 77 IU/L (ref 39–117)
BUN/Creatinine Ratio: 22 (ref 9–23)
BUN: 13 mg/dL (ref 6–24)
Bilirubin Total: 0.3 mg/dL (ref 0.0–1.2)
CO2: 26 mmol/L (ref 18–29)
Calcium: 9.8 mg/dL (ref 8.7–10.2)
Chloride: 98 mmol/L (ref 96–106)
Creatinine, Ser: 0.59 mg/dL (ref 0.57–1.00)
GFR calc Af Amer: 119 mL/min/{1.73_m2} (ref >59)
GFR calc non Af Amer: 104 mL/min/{1.73_m2} (ref >59)
Globulin, Total: 2.8 g/dL (ref 1.5–4.5)
Glucose: 87 mg/dL (ref 65–99)
Potassium: 3.9 mmol/L (ref 3.5–5.2)
Sodium: 141 mmol/L (ref 134–144)
Total Protein: 7.3 g/dL (ref 6.0–8.5)

## 2016-07-29 LAB — VITAMIN B12: Vitamin B-12: 660 pg/mL (ref 232–1245)

## 2016-08-09 ENCOUNTER — Other Ambulatory Visit: Payer: Self-pay | Admitting: Family Medicine

## 2016-08-09 ENCOUNTER — Ambulatory Visit: Payer: Self-pay | Admitting: Orthopedic Surgery

## 2016-08-09 MED ORDER — METRONIDAZOLE 500 MG PO TABS: 500.0000 mg | ORAL_TABLET | Freq: Two times a day (BID) | ORAL | 0 refills | Status: DC

## 2016-08-09 MED ORDER — CIPROFLOXACIN HCL 500 MG PO TABS: 500.0000 mg | ORAL_TABLET | Freq: Two times a day (BID) | ORAL | 0 refills | Status: DC

## 2016-08-09 MED FILL — metroNIDAZOLE 500 MG TABS: 500 | 10 days supply | Qty: 20 | Fill #0

## 2016-08-09 MED FILL — CIPROFLOXACIN HCL 500 MG TA: 500 | 10 days supply | Qty: 20 | Fill #0

## 2016-08-09 NOTE — Progress Notes (Signed)
Patient has uti, >30 wbcs, 3-10 rbcs, mod bacteria Treat for diverticulitis as well

## 2016-08-10 ENCOUNTER — Encounter (HOSPITAL_BASED_OUTPATIENT_CLINIC_OR_DEPARTMENT_OTHER): Payer: Self-pay | Admitting: *Deleted

## 2016-08-13 ENCOUNTER — Ambulatory Visit (HOSPITAL_BASED_OUTPATIENT_CLINIC_OR_DEPARTMENT_OTHER): Payer: 59 | Admitting: Anesthesiology

## 2016-08-13 ENCOUNTER — Encounter (HOSPITAL_BASED_OUTPATIENT_CLINIC_OR_DEPARTMENT_OTHER): Payer: Self-pay | Admitting: *Deleted

## 2016-08-13 ENCOUNTER — Ambulatory Visit (HOSPITAL_BASED_OUTPATIENT_CLINIC_OR_DEPARTMENT_OTHER)
Admission: RE | Admit: 2016-08-13 | Discharge: 2016-08-13 | Disposition: A | Discharge status: Home / Self Care | Payer: 59 | Source: Ambulatory Visit | Attending: Orthopedic Surgery | Admitting: Orthopedic Surgery

## 2016-08-13 ENCOUNTER — Encounter (HOSPITAL_BASED_OUTPATIENT_CLINIC_OR_DEPARTMENT_OTHER)
Admission: RE | Disposition: A | Discharge status: Home / Self Care | Payer: Self-pay | Source: Ambulatory Visit | Attending: Orthopedic Surgery

## 2016-08-13 DIAGNOSIS — I252 Old myocardial infarction: Secondary | ICD-10-CM | POA: Diagnosis not present

## 2016-08-13 DIAGNOSIS — I10 Essential (primary) hypertension: Secondary | ICD-10-CM | POA: Insufficient documentation

## 2016-08-13 DIAGNOSIS — F329 Major depressive disorder, single episode, unspecified: Secondary | ICD-10-CM | POA: Insufficient documentation

## 2016-08-13 DIAGNOSIS — Z85828 Personal history of other malignant neoplasm of skin: Secondary | ICD-10-CM | POA: Diagnosis not present

## 2016-08-13 DIAGNOSIS — Z79899 Other long term (current) drug therapy: Secondary | ICD-10-CM | POA: Diagnosis not present

## 2016-08-13 DIAGNOSIS — Z87891 Personal history of nicotine dependence: Secondary | ICD-10-CM | POA: Diagnosis not present

## 2016-08-13 DIAGNOSIS — I2510 Atherosclerotic heart disease of native coronary artery without angina pectoris: Secondary | ICD-10-CM | POA: Insufficient documentation

## 2016-08-13 DIAGNOSIS — G4730 Sleep apnea, unspecified: Secondary | ICD-10-CM | POA: Diagnosis not present

## 2016-08-13 DIAGNOSIS — F419 Anxiety disorder, unspecified: Secondary | ICD-10-CM | POA: Diagnosis not present

## 2016-08-13 DIAGNOSIS — E785 Hyperlipidemia, unspecified: Secondary | ICD-10-CM | POA: Diagnosis not present

## 2016-08-13 DIAGNOSIS — Z955 Presence of coronary angioplasty implant and graft: Secondary | ICD-10-CM | POA: Diagnosis not present

## 2016-08-13 DIAGNOSIS — Z7982 Long term (current) use of aspirin: Secondary | ICD-10-CM | POA: Diagnosis not present

## 2016-08-13 DIAGNOSIS — E559 Vitamin D deficiency, unspecified: Secondary | ICD-10-CM | POA: Insufficient documentation

## 2016-08-13 DIAGNOSIS — G709 Myoneural disorder, unspecified: Secondary | ICD-10-CM | POA: Insufficient documentation

## 2016-08-13 DIAGNOSIS — G5602 Carpal tunnel syndrome, left upper limb: Secondary | ICD-10-CM | POA: Insufficient documentation

## 2016-08-13 HISTORY — DX: Adverse effect of unspecified anesthetic, initial encounter: T41.45XA

## 2016-08-13 HISTORY — DX: Other complications of anesthesia, initial encounter: T88.59XA

## 2016-08-13 HISTORY — DX: Nausea with vomiting, unspecified: Z98.890

## 2016-08-13 HISTORY — PX: CARPAL TUNNEL RELEASE: SHX101

## 2016-08-13 HISTORY — DX: Other specified postprocedural states: Z98.890

## 2016-08-13 HISTORY — DX: Diverticulitis of intestine, part unspecified, without perforation or abscess without bleeding: K57.92

## 2016-08-13 HISTORY — DX: Nausea with vomiting, unspecified: R11.2

## 2016-08-13 SURGERY — CARPAL TUNNEL RELEASE
Anesthesia: Monitor Anesthesia Care | Site: Wrist | Laterality: Left

## 2016-08-13 MED ORDER — OXYCODONE HCL 5 MG PO TABS: 5.0000 mg | ORAL_TABLET | Freq: Once | ORAL | Status: DC | PRN

## 2016-08-13 MED ORDER — HYDROMORPHONE HCL 1 MG/ML IJ SOLN: 0.2500 mg | INTRAMUSCULAR | Status: DC | PRN

## 2016-08-13 MED ORDER — ONDANSETRON HCL 4 MG/2ML IJ SOLN
INTRAMUSCULAR | Status: DC | PRN
Start: 2016-08-13 — End: 2016-08-13
  Administered 2016-08-13: 4 mg via INTRAVENOUS

## 2016-08-13 MED ORDER — SCOPOLAMINE 1 MG/3DAYS TD PT72: 1.0000 | MEDICATED_PATCH | Freq: Once | TRANSDERMAL | Status: DC | PRN

## 2016-08-13 MED ORDER — CEFAZOLIN SODIUM-DEXTROSE 2-4 GM/100ML-% IV SOLN
2.0000 g | INTRAVENOUS | Status: AC
  Administered 2016-08-13: 2 g via INTRAVENOUS

## 2016-08-13 MED ORDER — HYDROCODONE-ACETAMINOPHEN 5-325 MG PO TABS: 2.0000 | ORAL_TABLET | Freq: Four times a day (QID) | ORAL | 0 refills | Status: DC | PRN

## 2016-08-13 MED ORDER — MIDAZOLAM HCL 2 MG/2ML IJ SOLN
INTRAMUSCULAR | Status: AC
  Filled 2016-08-13: qty 2

## 2016-08-13 MED ORDER — MIDAZOLAM HCL 2 MG/2ML IJ SOLN
1.0000 mg | INTRAMUSCULAR | Status: DC | PRN
  Administered 2016-08-13: 1 mg via INTRAVENOUS

## 2016-08-13 MED ORDER — LIDOCAINE HCL (PF) 1 % IJ SOLN
INTRAMUSCULAR | Status: DC | PRN
  Administered 2016-08-13: 10 mL

## 2016-08-13 MED ORDER — FENTANYL CITRATE (PF) 100 MCG/2ML IJ SOLN
50.0000 ug | INTRAMUSCULAR | Status: DC | PRN
  Administered 2016-08-13: 50 ug via INTRAVENOUS

## 2016-08-13 MED ORDER — CHLORHEXIDINE GLUCONATE 4 % EX LIQD: 60.0000 mL | Freq: Once | CUTANEOUS | Status: DC

## 2016-08-13 MED ORDER — CEFAZOLIN SODIUM-DEXTROSE 2-4 GM/100ML-% IV SOLN
INTRAVENOUS | Status: AC
  Filled 2016-08-13: qty 100

## 2016-08-13 MED ORDER — MEPERIDINE HCL 25 MG/ML IJ SOLN: 6.2500 mg | INTRAMUSCULAR | Status: DC | PRN

## 2016-08-13 MED ORDER — BUPIVACAINE HCL (PF) 0.25 % IJ SOLN
INTRAMUSCULAR | Status: DC | PRN
  Administered 2016-08-13: 10 mL

## 2016-08-13 MED ORDER — FENTANYL CITRATE (PF) 100 MCG/2ML IJ SOLN
INTRAMUSCULAR | Status: AC
  Filled 2016-08-13: qty 2

## 2016-08-13 MED ORDER — OXYCODONE HCL 5 MG/5ML PO SOLN: 5.0000 mg | Freq: Once | ORAL | Status: DC | PRN

## 2016-08-13 MED ORDER — ONDANSETRON HCL 4 MG/2ML IJ SOLN
INTRAMUSCULAR | Status: AC
  Filled 2016-08-13: qty 2

## 2016-08-13 MED ORDER — LACTATED RINGERS IV SOLN
INTRAVENOUS | Status: DC
  Administered 2016-08-13: 08:00:00 via INTRAVENOUS

## 2016-08-13 MED ORDER — PROMETHAZINE HCL 25 MG/ML IJ SOLN: 6.2500 mg | INTRAMUSCULAR | Status: DC | PRN

## 2016-08-13 MED FILL — HYDROCODON-APAP 5-325: 5-325 | 4 days supply | Qty: 30 | Fill #0

## 2016-08-13 SURGICAL SUPPLY — 48 items
BANDAGE ACE 3X5.8 VEL STRL LF (GAUZE/BANDAGES/DRESSINGS) ×2 IMPLANT
BLADE CARPAL TUNNEL SNGL USE (BLADE) ×2 IMPLANT
BLADE SURG 15 STRL LF DISP TIS (BLADE) ×2 IMPLANT
BLADE SURG 15 STRL SS (BLADE) ×4
BNDG CONFORM 3 STRL LF (GAUZE/BANDAGES/DRESSINGS) ×2 IMPLANT
BRUSH SCRUB EZ PLAIN DRY (MISCELLANEOUS) ×2 IMPLANT
CORDS BIPOLAR (ELECTRODE) ×2 IMPLANT
COVER BACK TABLE 60X90IN (DRAPES) ×2 IMPLANT
CUFF TOURNIQUET SINGLE 18IN (TOURNIQUET CUFF) ×1 IMPLANT
DRAIN PENROSE 1/4X12 LTX STRL (WOUND CARE) IMPLANT
DRAPE EXTREMITY T 121X128X90 (DRAPE) ×2 IMPLANT
DRAPE SURG 17X23 STRL (DRAPES) ×2 IMPLANT
DRSG EMULSION OIL 3X3 NADH (GAUZE/BANDAGES/DRESSINGS) ×2 IMPLANT
GAUZE SPONGE 4X4 12PLY STRL (GAUZE/BANDAGES/DRESSINGS) IMPLANT
GAUZE SPONGE 4X4 16PLY XRAY LF (GAUZE/BANDAGES/DRESSINGS) IMPLANT
GAUZE XEROFORM 1X8 LF (GAUZE/BANDAGES/DRESSINGS) ×2 IMPLANT
GLOVE BIOGEL M STRL SZ7.5 (GLOVE) IMPLANT
GLOVE BIOGEL PI IND STRL 7.0 (GLOVE) IMPLANT
GLOVE BIOGEL PI INDICATOR 7.0 (GLOVE) ×3
GLOVE ECLIPSE 6.5 STRL STRAW (GLOVE) ×2 IMPLANT
GLOVE SS BIOGEL STRL SZ 8 (GLOVE) ×1 IMPLANT
GLOVE SUPERSENSE BIOGEL SZ 8 (GLOVE) ×1
GOWN STRL REUS W/ TWL LRG LVL3 (GOWN DISPOSABLE) ×1 IMPLANT
GOWN STRL REUS W/ TWL XL LVL3 (GOWN DISPOSABLE) ×1 IMPLANT
GOWN STRL REUS W/TWL LRG LVL3 (GOWN DISPOSABLE) ×2
GOWN STRL REUS W/TWL XL LVL3 (GOWN DISPOSABLE) ×2
LOOP VESSEL MAXI BLUE (MISCELLANEOUS) IMPLANT
NDL HYPO 25X1 1.5 SAFETY (NEEDLE) ×2 IMPLANT
NDL SAFETY ECLIPSE 18X1.5 (NEEDLE) ×1 IMPLANT
NEEDLE HYPO 18GX1.5 SHARP (NEEDLE) ×2
NEEDLE HYPO 22GX1.5 SAFETY (NEEDLE) IMPLANT
NEEDLE HYPO 25X1 1.5 SAFETY (NEEDLE) ×4 IMPLANT
NS IRRIG 1000ML POUR BTL (IV SOLUTION) ×2 IMPLANT
PACK BASIN DAY SURGERY FS (CUSTOM PROCEDURE TRAY) ×2 IMPLANT
PAD ALCOHOL SWAB (MISCELLANEOUS) ×16 IMPLANT
PAD CAST 3X4 CTTN HI CHSV (CAST SUPPLIES) ×2 IMPLANT
PADDING CAST ABS 4INX4YD NS (CAST SUPPLIES) ×1
PADDING CAST ABS COTTON 4X4 ST (CAST SUPPLIES) ×1 IMPLANT
PADDING CAST COTTON 3X4 STRL (CAST SUPPLIES) ×2
SHEET MEDIUM DRAPE 40X70 STRL (DRAPES) ×2 IMPLANT
STOCKINETTE 4X48 STRL (DRAPES) ×2 IMPLANT
SUT PROLENE 4 0 PS 2 18 (SUTURE) ×2 IMPLANT
SYR BULB 3OZ (MISCELLANEOUS) ×2 IMPLANT
SYR CONTROL 10ML LL (SYRINGE) ×4 IMPLANT
TOWEL OR 17X24 6PK STRL BLUE (TOWEL DISPOSABLE) ×2 IMPLANT
TOWEL OR NON WOVEN STRL DISP B (DISPOSABLE) ×2 IMPLANT
TRAY DSU PREP LF (CUSTOM PROCEDURE TRAY) ×2 IMPLANT
UNDERPAD 30X30 (UNDERPADS AND DIAPERS) ×2 IMPLANT

## 2016-08-13 NOTE — H&P (Signed)
Heather Lam is an 56 y.o. female.   Chief Complaint: left CTS plan Left CTR [id number] for left carpal tunnel release  Patient presents for evaluation and treatment of the of their upper extremity predicament. The patient denies neck, back, chest or  abdominal pain. The patient notes that they have no lower extremity problems. The patients primary complaint is noted. We are planning surgical care pathway for the upper extremity.  Past Medical History:  Diagnosis Date  . Anxiety   . Back pain   . CAD (coronary artery disease)   . Chest pain   . Complication of anesthesia   . Constipation   . Diverticulitis   . Diverticulosis   . Heart attack 2010  . Hyperlipemia   . Hypertension   . Joint pain   . PONV (postoperative nausea and vomiting)   . Skin cancer   . Sleep apnea    wears CPAP nightly  . Vitamin D deficiency     Past Surgical History:  Procedure Laterality Date  . APPENDECTOMY    . BACK SURGERY    . CARPAL TUNNEL RELEASE Right   . CORONARY STENT PLACEMENT    . ENDOMETRIAL ABLATION  2005  . MOHS SURGERY    . TUBAL LIGATION  1991    Family History  Problem Relation Age of Onset  . Hyperlipidemia Mother   . Hypertension Mother   . Cancer Father     skin  . Hyperlipidemia Father   . Dementia Father   . Arthritis Father   . Hypertension Father   . Sleep apnea Father   . Other Sister     Myelofibrosis  . Arthritis Sister   . Hyperlipidemia Sister   . Arthritis Sister   . Hyperlipidemia Sister   . Diabetes Paternal Grandfather   . Colon cancer Neg Hx    Social History:  reports that she has quit smoking. She has never used smokeless tobacco. She reports that she drinks alcohol. She reports that she does not use drugs.  Allergies: No Known Allergies  Medications Prior to Admission  Medication Sig Dispense Refill  . Ascorbic Acid (VITAMIN C) 1000 MG tablet Take 1,000 mg by mouth daily.    [name] aspirin 325 MG tablet Take 325 mg by mouth daily.    [name] b  complex vitamins tablet Take 1 tablet by mouth daily.    . Cholecalciferol (VITAMIN D) 1000 UNITS capsule Take 2,000 Units by mouth daily.     . ciprofloxacin (CIPRO) 500 MG tablet Take 1 tablet (500 mg total) by mouth 2 (two) times daily. 20 tablet 0  . clopidogrel (PLAVIX) 75 MG tablet Take 1 tablet (75 mg total) by mouth daily. 90 tablet 1  . Coenzyme Q10 (CO Q10) 100 MG CAPS Take 1 capsule by mouth daily.    . DULoxetine (CYMBALTA) 30 MG capsule Take 2 capsules (60 mg total) by mouth daily. 180 capsule 3  . losartan-hydrochlorothiazide (HYZAAR) 100-25 MG tablet Take 1 tablet by mouth daily. 90 tablet 1  . metoprolol tartrate (LOPRESSOR) 25 MG tablet Take 0.5 tablets (12.5 mg total) by mouth 2 (two) times daily. 180 tablet 1  . metroNIDAZOLE (FLAGYL) 500 MG tablet Take 1 tablet (500 mg total) by mouth 2 (two) times daily. 20 tablet 0  . Omega-3 Fatty Acids (FISH OIL) 1000 MG CAPS 1 capsule once. Take 1  Tabs qd    . rosuvastatin (CRESTOR) 10 MG tablet Take 1 tablet (10 mg total) by mouth every  other day. 90 tablet 1  . traZODone (DESYREL) 100 MG tablet Take 0.5-1 tablets (50-100 mg total) by mouth at bedtime. 90 tablet 3  . ALPRAZolam (XANAX) 0.25 MG tablet Take 0.25 mg by mouth as needed.     . nitroGLYCERIN (NITROSTAT) 0.4 MG SL tablet 1 TABLET UNDER TONGUE EVERY 5 MINUTES UP TO 3 TIMES FOR CHEST PAIN THEN CALL DR IF NO RELIEF 25 tablet 3    No results found for this or any previous visit (from the past 48 hour(s)). No results found.  ROS  Blood pressure 103/78, pulse 60, temperature 98 F (36.7 C), temperature source Oral, resp. rate 20, height 5\' 3"  (1.6 m), weight 95.7 kg (211 lb), last menstrual period 12/31/2013, SpO2 97 %. Physical Exam L CTS with positive tinels, phalens, and MNCT  The patient is alert and oriented in no acute distress. The patient complains of pain in the affected upper extremity.  The patient is noted to have a normal HEENT exam. Lung fields show equal chest  expansion and no shortness of breath. Abdomen exam is nontender without distention. Lower extremity examination does not show any fracture dislocation or blood clot symptoms. Pelvis is stable and the neck and back are stable and nontender.  Assessment/Plan We are planning surgery for your upper extremity. The risk and benefits of surgery to include risk of bleeding, infection, anesthesia,  damage to normal structures and failure of the surgery to accomplish its intended goals of relieving symptoms and restoring function have been discussed in detail. With this in mind we plan to proceed. I have specifically discussed with the patient the pre-and postoperative regime and the dos and don'ts and risk and benefits in great detail. Risk and benefits of surgery also include risk of dystrophy(CRPS), chronic nerve pain, failure of the healing process to go onto completion and other inherent risks of surgery The relavent the pathophysiology of the disease/injury process, as well as the alternatives for treatment and postoperative course of action has been discussed in great detail with the patient who desires to proceed.  We will do everything in our power to help you (the patient) restore function to the upper extremity. It is a pleasure to see this patient today.   Paulene Floor, MD 08/13/2016, 8:43 AM

## 2016-08-13 NOTE — Anesthesia Postprocedure Evaluation (Signed)
Anesthesia Post Note  Patient: Heather Lam  Procedure(s) Performed: Procedure(s) (LRB): LEFT CARPAL TUNNEL RELEASE (Left)  Patient location during evaluation: PACU Anesthesia Type: MAC Level of consciousness: awake and alert Pain management: pain level controlled Vital Signs Assessment: post-procedure vital signs reviewed and stable Respiratory status: spontaneous breathing Cardiovascular status: stable Anesthetic complications: no       Last Vitals:  Vitals:   08/13/16 1000 08/13/16 1027  BP: (!) 114/59 115/65  Pulse: (!) 57 69  Resp: 13 16  Temp:  36.5 C    Last Pain:  Vitals:   08/13/16 1027  TempSrc:   PainSc: 0-No pain                 Nolon Nations

## 2016-08-13 NOTE — Anesthesia Preprocedure Evaluation (Addendum)
Anesthesia Evaluation  Patient identified by MRN, date of birth, ID band Patient awake    Reviewed: Allergy & Precautions, NPO status , Patient's Chart, lab work & pertinent test results  History of Anesthesia Complications (+) PONV and history of anesthetic complications  Airway Mallampati: II  TM Distance: >3 FB Neck ROM: Full    Dental no notable dental hx.    Pulmonary sleep apnea , former smoker,    Pulmonary exam normal breath sounds clear to auscultation       Cardiovascular hypertension, + CAD and + Past MI  Normal cardiovascular exam Rhythm:Regular Rate:Normal     Neuro/Psych PSYCHIATRIC DISORDERS Anxiety Depression  Neuromuscular disease    GI/Hepatic negative GI ROS, Neg liver ROS,   Endo/Other  negative endocrine ROS  Renal/GU negative Renal ROS  negative genitourinary   Musculoskeletal negative musculoskeletal ROS (+)   Abdominal   Peds negative pediatric ROS (+)  Hematology negative hematology ROS (+)   Anesthesia Other Findings   Reproductive/Obstetrics negative OB ROS                            Anesthesia Physical Anesthesia Plan  ASA: III  Anesthesia Plan: MAC   Post-op Pain Management:    Induction: Intravenous  Airway Management Planned:   Additional Equipment:   Intra-op Plan:   Post-operative Plan:   Informed Consent: I have reviewed the patients History and Physical, chart, labs and discussed the procedure including the risks, benefits and alternatives for the proposed anesthesia with the patient or authorized representative who has indicated his/her understanding and acceptance.   Dental advisory given  Plan Discussed with: CRNA  Anesthesia Plan Comments:         Anesthesia Quick Evaluation

## 2016-08-13 NOTE — Op Note (Signed)
See op [id number]  SP L CTR  Mario Coronado MD

## 2016-08-13 NOTE — Transfer of Care (Signed)
Immediate Anesthesia Transfer of Care Note  Patient: Heather Lam  Procedure(s) Performed: Procedure(s): LEFT CARPAL TUNNEL RELEASE (Left)  Patient Location: PACU  Anesthesia Type:MAC  Level of Consciousness: awake, alert  and oriented  Airway & Oxygen Therapy: Patient Spontanous Breathing and Patient connected to face mask oxygen  Post-op Assessment: Report given to RN and Post -op Vital signs reviewed and stable  Post vital signs: Reviewed and stable  Last Vitals:  Vitals:   08/13/16 0736  BP: 103/78  Pulse: 60  Resp: 20  Temp: 36.7 C    Last Pain:  Vitals:   08/13/16 0736  TempSrc: Oral         Complications: No apparent anesthesia complications

## 2016-08-13 NOTE — Discharge Instructions (Signed)
We recommend that you to take vitamin C 1000 mg a day to promote healing. We rec Vit B6 200mg  for nerve recovery We also recommend that if you require  pain medicine that you take a stool softener to prevent constipation as most pain medicines will have constipation side effects. We recommend either Peri-Colace or Senokot and recommend that you also consider adding MiraLAX as well to prevent the constipation affects from pain medicine if you are required to use them. These medicines are over the counter and may be purchased at a local pharmacy. A cup of yogurt and a probiotic can also be helpful during the recovery process as the medicines can disrupt your intestinal environment. Keep bandage clean and dry.  Call for any problems.  No smoking.  Criteria for driving a car: you should be off your pain medicine for 7-8 hours, able to drive one handed(confident), thinking clearly and feeling able in your judgement to drive. Continue elevation as it will decrease swelling.  If instructed by MD move your fingers within the confines of the bandage/splint.  Use ice if instructed by your MD. Call immediately for any sudden loss of feeling in your hand/arm or change in functional abilities of the extremity.     Post Anesthesia Home Care Instructions  Activity: Get plenty of rest for the remainder of the day. A responsible adult should stay with you for 24 hours following the procedure.  For the next 24 hours, DO NOT: -Drive a car -Paediatric nurse -Drink alcoholic beverages -Take any medication unless instructed by your physician -Make any legal decisions or sign important papers.  Meals: Start with liquid foods such as gelatin or soup. Progress to regular foods as tolerated. Avoid greasy, spicy, heavy foods. If nausea and/or vomiting occur, drink only clear liquids until the nausea and/or vomiting subsides. Call your physician if vomiting continues.  Special Instructions/Symptoms: Your throat may feel  dry or sore from the anesthesia or the breathing tube placed in your throat during surgery. If this causes discomfort, gargle with warm salt water. The discomfort should disappear within 24 hours.  If you had a scopolamine patch placed behind your ear for the management of post- operative nausea and/or vomiting:  1. The medication in the patch is effective for 72 hours, after which it should be removed.  Wrap patch in a tissue and discard in the trash. Wash hands thoroughly with soap and water. 2. You may remove the patch earlier than 72 hours if you experience unpleasant side effects which may include dry mouth, dizziness or visual disturbances. 3. Avoid touching the patch. Wash your hands with soap and water after contact with the patch.

## 2016-08-16 ENCOUNTER — Ambulatory Visit (INDEPENDENT_AMBULATORY_CARE_PROVIDER_SITE_OTHER): Payer: 59 | Admitting: Family Medicine

## 2016-08-16 ENCOUNTER — Encounter (INDEPENDENT_AMBULATORY_CARE_PROVIDER_SITE_OTHER): Payer: Self-pay | Admitting: Family Medicine

## 2016-08-16 VITALS — BP 119/70 | Ht 63.0 in | Wt 206.0 lb

## 2016-08-16 DIAGNOSIS — Z9189 Other specified personal risk factors, not elsewhere classified: Secondary | ICD-10-CM | POA: Diagnosis not present

## 2016-08-16 DIAGNOSIS — E66812 Obesity, class 2: Secondary | ICD-10-CM | POA: Insufficient documentation

## 2016-08-16 DIAGNOSIS — R7303 Prediabetes: Secondary | ICD-10-CM | POA: Diagnosis not present

## 2016-08-16 DIAGNOSIS — Z6836 Body mass index (BMI) 36.0-36.9, adult: Secondary | ICD-10-CM | POA: Diagnosis not present

## 2016-08-16 DIAGNOSIS — E559 Vitamin D deficiency, unspecified: Secondary | ICD-10-CM

## 2016-08-16 DIAGNOSIS — E669 Obesity, unspecified: Secondary | ICD-10-CM | POA: Insufficient documentation

## 2016-08-16 NOTE — Op Note (Signed)
NAME:  WYNETTA, TEICHMAN                    ACCOUNT NO.:  MEDICAL RECORD NO.:  [id number]  LOCATION:                                 FACILITY:  PHYSICIAN:  Satira Anis. Amedeo Plenty, M.D.     DATE OF BIRTH:  DATE OF PROCEDURE:08/13/2016 DATE OF DISCHARGE:TBD                              OPERATIVE REPORT   PREOPERATIVE DIAGNOSIS:  Left carpal tunnel syndrome.  POSTOPERATIVE DIAGNOSIS:  Left carpal tunnel syndrome.  PROCEDURE: 1. Left median nerve/peripheral nerve block at the wrist-forearm level     for anesthetic purposes for carpal tunnel release. 2. Left limited open carpal tunnel release.  SURGEON:  Satira Anis. Amedeo Plenty, M.D.  ASSISTANT:  None.  COMPLICATIONS:  None.  ANESTHESIA:  Peripheral nerve block with IV sedation keeping the patient awake, alert, and oriented the entire case.  TOURNIQUET TIME:  Less than 10 minutes.  INDICATIONS:  The patient is pleasant female with carpal tunnel symptomatology.  She understands risks and benefits and desires to proceed.  OPERATIVE PROCEDURE:  The patient was seen by myself and Anesthesia, taken to the operative theater and underwent smooth induction of Hibiclens scrub by myself as a pre-scrub followed by alcohol prep and field block with Sensorcaine and lidocaine without epinephrine. Following this, 10 minutes surgical Betadine scrub was accomplished. Time-out observed.  Preop and postop check was complete, and the operation commenced with elevation of the tourniquet, left upper extremity at 250 mmHg.  Dissection was carried down to 1 cm incision. Distal edge of transverse carpal ligament was identified and released under 4.0 loupe magnification.  Fat pad egressed nicely.  Superficial palmar arch and the median nerve were carefully protected.  Following this, distal to proximal dissection was carried down until adequate room was available for canal, prepared toward device 1, 2, and 3, placed just under the proximal leading leaflet of the  transverse carpal ligament. Once this was performed with the patient awake, alert, and oriented, the patient had the security clip placed, obturator disengaged, security knife was placed, and security clip effectively releasing the proximal leaflet.  The patient tolerated this well.  There were no complicating features.  Once this was complete, I inspected the canal.  She was completely released, looked excellently decompressed in all areas of the transverse carpal ligament, compressions were nicely separated via the transverse carpal ligament release.  We deflated the tourniquet, obtained hemostasis with bipolar electrocautery and closed wound with Prolene.  She tolerated the procedure well.  She was awake, alert, and oriented as mentioned, and had sterile bandage applied.  Norco p.r.n. pain.  See me in a week.  Therapy in 12 days. Standard protocol.  This was an uncomplicated carpal tunnel release.     Satira Anis. Amedeo Plenty, M.D.     [facility]  D:  08/13/2016  T:  08/14/2016  Job:  [id number]

## 2016-08-16 NOTE — Progress Notes (Signed)
Office: [phone]  /  Fax: [phone]   HPI:   Chief Complaint: OBESITY Heather Lam is here to discuss her progress with her obesity treatment plan. She is following her eating plan approximately 95 % of the time and states she is exercising 0 minutes 0 times per week. Heather Lam is currently struggling with getting bored with plan. Heather Lam did well with weight loss, hunger was controlled.  Her weight is 206 lb (93.4 kg) today and has had a weight loss of 9 pounds over a period of 4 weeks since her last visit. She has lost 9 lbs since starting treatment with [place].  Vitamin D deficiency Heather Lam has a diagnosis of vitamin D deficiency. She is currently taking vit D and denies nausea, vomiting or muscle weakness.  Pre-Diabetes Heather Lam has a diagnosis of prediabetes based on her elevated HgA1c and was informed this puts her at greater risk of developing diabetes. She is not taking metformin currently and continues to work on diet and exercise to decrease risk of diabetes. She denies nausea or hypoglycemia.  At risk for diabetes Heather Lam is at higher than averagerisk for developing diabetes due to her obesity. She currently denies polyuria or polydipsia.   Wt Readings from Last 500 Encounters:  08/16/16 206 lb (93.4 kg)  08/13/16 211 lb (95.7 kg)  07/28/16 215 lb (97.5 kg)  07/06/16 220 lb 12.8 oz (100.2 kg)  06/03/16 210 lb (95.3 kg)  05/24/16 210 lb (95.3 kg)  05/05/16 210 lb (95.3 kg)  02/26/16 207 lb (93.9 kg)  02/25/16 207 lb (93.9 kg)  09/18/15 196 lb 3.2 oz (89 kg)  07/03/15 189 lb (85.7 kg)  07/01/15 189 lb (85.7 kg)  05/22/15 189 lb 1.9 oz (85.8 kg)  04/30/15 194 lb (88 kg)  03/24/15 199 lb 6.4 oz (90.4 kg)  06/20/13 215 lb (97.5 kg)  06/06/13 212 lb (96.2 kg)  05/26/12 221 lb (100.2 kg)  10/26/11 217 lb (98.4 kg)  10/13/11 217 lb (98.4 kg)  09/13/11 219 lb (99.3 kg)     ALLERGIES: No Known Allergies  MEDICATIONS: Current Outpatient Prescriptions on File Prior to Visit    Medication Sig Dispense Refill  . ALPRAZolam (XANAX) 0.25 MG tablet Take 0.25 mg by mouth as needed.     . Ascorbic Acid (VITAMIN C) 1000 MG tablet Take 1,000 mg by mouth daily.    [name] aspirin 325 MG tablet Take 325 mg by mouth daily.    [name] b complex vitamins tablet Take 1 tablet by mouth daily.    . ciprofloxacin (CIPRO) 500 MG tablet Take 1 tablet (500 mg total) by mouth 2 (two) times daily. 20 tablet 0  . clopidogrel (PLAVIX) 75 MG tablet Take 1 tablet (75 mg total) by mouth daily. 90 tablet 1  . Coenzyme Q10 (CO Q10) 100 MG CAPS Take 1 capsule by mouth daily.    . DULoxetine (CYMBALTA) 30 MG capsule Take 2 capsules (60 mg total) by mouth daily. 180 capsule 3  . HYDROcodone-acetaminophen (NORCO) 5-325 MG tablet Take 2 tablets by mouth every 6 (six) hours as needed for moderate pain. 30 tablet 0  . losartan-hydrochlorothiazide (HYZAAR) 100-25 MG tablet Take 1 tablet by mouth daily. 90 tablet 1  . metoprolol tartrate (LOPRESSOR) 25 MG tablet Take 0.5 tablets (12.5 mg total) by mouth 2 (two) times daily. 180 tablet 1  . metroNIDAZOLE (FLAGYL) 500 MG tablet Take 1 tablet (500 mg total) by mouth 2 (two) times daily. 20 tablet 0  . nitroGLYCERIN (NITROSTAT)  0.4 MG SL tablet 1 TABLET UNDER TONGUE EVERY 5 MINUTES UP TO 3 TIMES FOR CHEST PAIN THEN CALL DR IF NO RELIEF 25 tablet 3  . Omega-3 Fatty Acids (FISH OIL) 1000 MG CAPS 1 capsule once. Take 1  Tabs qd    . rosuvastatin (CRESTOR) 10 MG tablet Take 1 tablet (10 mg total) by mouth every other day. 90 tablet 1  . traZODone (DESYREL) 100 MG tablet Take 0.5-1 tablets (50-100 mg total) by mouth at bedtime. 90 tablet 3   No current facility-administered medications on file prior to visit.     PAST MEDICAL HISTORY: Past Medical History:  Diagnosis Date  . Anxiety   . Back pain   . CAD (coronary artery disease)   . Chest pain   . Complication of anesthesia   . Constipation   . Diverticulitis   . Diverticulosis   . Heart attack 2010  .  Hyperlipemia   . Hypertension   . Joint pain   . PONV (postoperative nausea and vomiting)   . Skin cancer   . Sleep apnea    wears CPAP nightly  . Vitamin D deficiency     PAST SURGICAL HISTORY: Past Surgical History:  Procedure Laterality Date  . APPENDECTOMY    . BACK SURGERY    . CARPAL TUNNEL RELEASE Right   . CARPAL TUNNEL RELEASE Left 08/13/2016   Procedure: LEFT CARPAL TUNNEL RELEASE;  Surgeon: Roseanne Kaufman, MD;  Location: [place];  Service: Orthopedics;  Laterality: Left;  . CORONARY STENT PLACEMENT    . ENDOMETRIAL ABLATION  2005  . MOHS SURGERY    . TUBAL LIGATION  1991    SOCIAL HISTORY: Social History  Substance Use Topics  . Smoking status: Former Research scientist (life sciences)  . Smokeless tobacco: Never Used  . Alcohol use Yes     Comment: Rare     FAMILY HISTORY: Family History  Problem Relation Age of Onset  . Hyperlipidemia Mother   . Hypertension Mother   . Cancer Father     skin  . Hyperlipidemia Father   . Dementia Father   . Arthritis Father   . Hypertension Father   . Sleep apnea Father   . Other Sister     Myelofibrosis  . Arthritis Sister   . Hyperlipidemia Sister   . Arthritis Sister   . Hyperlipidemia Sister   . Diabetes Paternal Grandfather   . Colon cancer Neg Hx     ROS: Review of Systems  Constitutional: Positive for weight loss.  Gastrointestinal: Negative for nausea and vomiting.  Genitourinary: Negative for frequency.  Musculoskeletal:       Negative Muscle Weakness  Endo/Heme/Allergies: Negative for polydipsia.       Negative Hypoglycemia    PHYSICAL EXAM: Blood pressure 119/70, height 5\' 3"  (1.6 m), weight 206 lb (93.4 kg), last menstrual period 12/31/2013. Body mass index is 36.49 kg/m. Physical Exam  Constitutional: She is oriented to person, place, and time. She appears well-developed and well-nourished.  Cardiovascular: Normal rate.   Pulmonary/Chest: Effort normal.  Musculoskeletal: Normal range of motion.    Neurological: She is oriented to person, place, and time.  Skin: Skin is warm and dry.  Psychiatric: She has a normal mood and affect. Her behavior is normal.  Vitals reviewed.   RECENT LABS AND TESTS: BMET    Component Value Date/Time   NA 141 07/28/2016 1600   K 3.9 07/28/2016 1600   CL 98 07/28/2016 1600   CO2 26  07/28/2016 1600   GLUCOSE 87 07/28/2016 1600   GLUCOSE 104 (H) 10/06/2008 0545   BUN 13 07/28/2016 1600   CREATININE 0.59 07/28/2016 1600   CALCIUM 9.8 07/28/2016 1600   GFRNONAA 104 07/28/2016 1600   GFRAA 119 07/28/2016 1600   Lab Results  Component Value Date   HGBA1C 6.0 (H) 07/28/2016   HGBA1C  10/07/2008    5.6 (NOTE)   The ADA recommends the following therapeutic goal for glycemic   control related to Hgb A1C measurement:   Goal of Therapy:   < 7.0% Hgb A1C   Reference: American Diabetes Association: Clinical Practice   Recommendations 2008, Diabetes Care,  2008, 31:(Suppl 1).   Lab Results  Component Value Date   INSULIN 29.2 (H) 07/28/2016   CBC    Component Value Date/Time   WBC 10.2 07/28/2016 1600   WBC 10.9 (H) 10/06/2008 0545   RBC 4.77 07/28/2016 1600   RBC 4.25 10/06/2008 0545   HGB 12.9 10/06/2008 0545   HCT 39.3 07/28/2016 1600   PLT 226 03/24/2015 1026   MCV 82 07/28/2016 1600   MCH 27.3 07/28/2016 1600   MCHC 33.1 07/28/2016 1600   MCHC 34.8 10/06/2008 0545   RDW 14.5 07/28/2016 1600   LYMPHSABS 2.9 07/28/2016 1600   MONOABS 0.7 10/05/2008 1425   EOSABS 0.2 07/28/2016 1600   BASOSABS 0.1 07/28/2016 1600   Iron/TIBC/Ferritin/ %Sat No results found for: IRON, TIBC, FERRITIN, IRONPCTSAT Lipid Panel     Component Value Date/Time   CHOL 133 07/28/2016 1600   TRIG 143 07/28/2016 1600   TRIG 156 (H) 06/06/2013 1147   HDL 47 07/28/2016 1600   HDL 50 06/06/2013 1147   CHOLHDL 3.4 06/29/2016 0822   CHOLHDL 6.1 10/07/2008 0500   VLDL 26 10/07/2008 0500   LDLCALC 57 07/28/2016 1600   LDLCALC 50 06/06/2013 1147   Hepatic  Function Panel   ASSESSMENT AND PLAN: Vitamin D deficiency - Plan: Cholecalciferol 2000 units CAPS  Prediabetes  At risk for diabetes mellitus  Class 2 obesity without serious comorbidity with body mass index (BMI) of 36.0 to 36.9 in adult, unspecified obesity type  PLAN:  Vitamin D Deficiency Kelin was informed that low vitamin D levels contributes to fatigue and are associated with obesity, breast, and colon cancer. She agrees to continue to take OTC Vit D @2 ,000 IU every day and will re-check labs in 3 months and will follow up for routine testing of vitamin D, at least 2-3 times per year. She was informed of the risk of over-replacement of vitamin D and agrees to not increase her dose unless he discusses this with [place] first.  Pre-Diabetes Cooper will continue to work on weight loss, exercise, and decreasing simple carbohydrates in her diet to help decrease the risk of diabetes. We dicussed metformin including benefits and risks. She was informed that eating too many simple carbohydrates or too many calories at one sitting increases the likelihood of GI side effects. Janicedeclined metformin for now and a prescription was not written today. Will re-check labs in 3 months. Catalya agreed to follow up with [place] as directed to monitor her progress.  Diabetes risk counselling Blakelyn was given extended (at least 30 minutes) diabetes prevention counseling today. She is 56 y.o. female and has risk factors for diabetes including obesity. We discussed intensive lifestyle modifications today with an emphasis on weight loss as well as increasing exercise and decreasing simple carbohydrates in her diet.  Obesity Zariana is currently in the  action stage of change. As such, her goal is to continue with weight loss efforts She has agreed to keep a food journal with change to 350-500 calories and 30+ grams protein at supper daily, sugar below 10 grams daily and follow the Category 3 plan. Kainani has been  instructed to work up to a goal of 150 minutes of combined cardio and strengthening exercise per week for weight loss and overall health benefits. We discussed the following Behavioral Modification Stratagies today: increasing lean protein intake, decreasing simple carbohydrates  and increasing vegetables  Nashika has agreed to follow up with our clinic in 2 weeks. She was informed of the importance of frequent follow up visits to maximize her success with intensive lifestyle modifications for her multiple health conditions.  I, Doreene Nest, am acting as scribe for Dennard Nip, MD  I have reviewed the above documentation for accuracy and completeness, and I agree with the above. -Dennard Nip, MD

## 2016-08-17 ENCOUNTER — Encounter (HOSPITAL_BASED_OUTPATIENT_CLINIC_OR_DEPARTMENT_OTHER): Payer: Self-pay | Admitting: Orthopedic Surgery

## 2016-08-17 ENCOUNTER — Encounter (INDEPENDENT_AMBULATORY_CARE_PROVIDER_SITE_OTHER): Payer: Self-pay | Admitting: Family Medicine

## 2016-08-17 MED ORDER — CHOLECALCIFEROL 50 MCG (2000 UT) PO CAPS: 1.0000 | ORAL_CAPSULE | Freq: Every day | ORAL | Status: DC

## 2016-08-20 DIAGNOSIS — G4733 Obstructive sleep apnea (adult) (pediatric): Secondary | ICD-10-CM | POA: Diagnosis not present

## 2016-08-20 DIAGNOSIS — G5602 Carpal tunnel syndrome, left upper limb: Secondary | ICD-10-CM | POA: Diagnosis not present

## 2016-08-20 DIAGNOSIS — Z4789 Encounter for other orthopedic aftercare: Secondary | ICD-10-CM | POA: Diagnosis not present

## 2016-08-27 DIAGNOSIS — M79642 Pain in left hand: Secondary | ICD-10-CM | POA: Diagnosis not present

## 2016-08-31 ENCOUNTER — Encounter (INDEPENDENT_AMBULATORY_CARE_PROVIDER_SITE_OTHER): Payer: Self-pay | Admitting: Family Medicine

## 2016-08-31 ENCOUNTER — Ambulatory Visit (INDEPENDENT_AMBULATORY_CARE_PROVIDER_SITE_OTHER): Payer: 59 | Admitting: Family Medicine

## 2016-08-31 VITALS — BP 133/76 | HR 73 | Temp 98.5°F | Ht 63.0 in | Wt 204.0 lb

## 2016-08-31 DIAGNOSIS — Z9189 Other specified personal risk factors, not elsewhere classified: Secondary | ICD-10-CM

## 2016-08-31 DIAGNOSIS — E669 Obesity, unspecified: Secondary | ICD-10-CM | POA: Diagnosis not present

## 2016-08-31 DIAGNOSIS — Z6836 Body mass index (BMI) 36.0-36.9, adult: Secondary | ICD-10-CM

## 2016-08-31 DIAGNOSIS — R7303 Prediabetes: Secondary | ICD-10-CM

## 2016-08-31 MED ORDER — METFORMIN HCL 500 MG PO TABS: 500.0000 mg | ORAL_TABLET | Freq: Every day | ORAL | 0 refills | Status: DC

## 2016-08-31 MED FILL — metFORMIN HCL 500 MG TABS: 500 | 30 days supply | Qty: 30 | Fill #0

## 2016-09-01 NOTE — Progress Notes (Signed)
Office: [phone]  /  Fax: [phone]   HPI:   Chief Complaint: OBESITY Heather Lam is here to discuss her progress with her obesity treatment plan. She is following her eating plan approximately 80 % of the time and states she is exercising 0 minutes 0 times per week. Heather Lam is currently struggling with increased challenges while recovering from surgery. She is journaling some but has increased eating out more.  Her weight is 204 lb (92.5 kg) today and has had a weight loss of 2 pounds over a period of 2 weeks since her last visit. She has lost 11 lbs since starting treatment with [place].  Pre-Diabetes Heather Lam has a diagnosis of prediabetes based on her elevated HgA1c and was informed this puts her at greater risk of developing diabetes. She is not taking metformin currently, she has declined Metformin in the past but is re-considering it now. She continues to work on diet and exercise to decrease risk of diabetes. She admits polyphagia and denies nausea or hypoglycemia.  At risk for diabetes Heather Lam is at higher than average risk for developing diabetes due to her obesity and prediabetes. She currently denies polyuria or polydipsia.   Wt Readings from Last 500 Encounters:  08/31/16 204 lb (92.5 kg)  08/16/16 206 lb (93.4 kg)  08/13/16 211 lb (95.7 kg)  07/28/16 215 lb (97.5 kg)  07/06/16 220 lb 12.8 oz (100.2 kg)  06/03/16 210 lb (95.3 kg)  05/24/16 210 lb (95.3 kg)  05/05/16 210 lb (95.3 kg)  02/26/16 207 lb (93.9 kg)  02/25/16 207 lb (93.9 kg)  09/18/15 196 lb 3.2 oz (89 kg)  07/03/15 189 lb (85.7 kg)  07/01/15 189 lb (85.7 kg)  05/22/15 189 lb 1.9 oz (85.8 kg)  04/30/15 194 lb (88 kg)  03/24/15 199 lb 6.4 oz (90.4 kg)  06/20/13 215 lb (97.5 kg)  06/06/13 212 lb (96.2 kg)  05/26/12 221 lb (100.2 kg)  10/26/11 217 lb (98.4 kg)  10/13/11 217 lb (98.4 kg)  09/13/11 219 lb (99.3 kg)     ALLERGIES: No Known Allergies  MEDICATIONS: Current Outpatient Prescriptions on File  Prior to Visit  Medication Sig Dispense Refill  . ALPRAZolam (XANAX) 0.25 MG tablet Take 0.25 mg by mouth as needed.     . Ascorbic Acid (VITAMIN C) 1000 MG tablet Take 1,000 mg by mouth daily.    [name] aspirin 325 MG tablet Take 325 mg by mouth daily.    [name] b complex vitamins tablet Take 1 tablet by mouth daily.    . Cholecalciferol 2000 units CAPS Take 1 capsule (2,000 Units total) by mouth daily. 30 each   . clopidogrel (PLAVIX) 75 MG tablet Take 1 tablet (75 mg total) by mouth daily. 90 tablet 1  . Coenzyme Q10 (CO Q10) 100 MG CAPS Take 1 capsule by mouth daily.    . DULoxetine (CYMBALTA) 30 MG capsule Take 2 capsules (60 mg total) by mouth daily. 180 capsule 3  . losartan-hydrochlorothiazide (HYZAAR) 100-25 MG tablet Take 1 tablet by mouth daily. 90 tablet 1  . metoprolol tartrate (LOPRESSOR) 25 MG tablet Take 0.5 tablets (12.5 mg total) by mouth 2 (two) times daily. 180 tablet 1  . nitroGLYCERIN (NITROSTAT) 0.4 MG SL tablet 1 TABLET UNDER TONGUE EVERY 5 MINUTES UP TO 3 TIMES FOR CHEST PAIN THEN CALL DR IF NO RELIEF 25 tablet 3  . Omega-3 Fatty Acids (FISH OIL) 1000 MG CAPS 1 capsule once. Take 1  Tabs qd    .  rosuvastatin (CRESTOR) 10 MG tablet Take 1 tablet (10 mg total) by mouth every other day. 90 tablet 1  . traZODone (DESYREL) 100 MG tablet Take 0.5-1 tablets (50-100 mg total) by mouth at bedtime. 90 tablet 3   No current facility-administered medications on file prior to visit.     PAST MEDICAL HISTORY: Past Medical History:  Diagnosis Date  . Anxiety   . Back pain   . CAD (coronary artery disease)   . Chest pain   . Complication of anesthesia   . Constipation   . Diverticulitis   . Diverticulosis   . Heart attack 2010  . Hyperlipemia   . Hypertension   . Joint pain   . PONV (postoperative nausea and vomiting)   . Skin cancer   . Sleep apnea    wears CPAP nightly  . Vitamin D deficiency     PAST SURGICAL HISTORY: Past Surgical History:  Procedure Laterality Date  .  APPENDECTOMY    . BACK SURGERY    . CARPAL TUNNEL RELEASE Right   . CARPAL TUNNEL RELEASE Left 08/13/2016   Procedure: LEFT CARPAL TUNNEL RELEASE;  Surgeon: Roseanne Kaufman, MD;  Location: [place];  Service: Orthopedics;  Laterality: Left;  . CORONARY STENT PLACEMENT    . ENDOMETRIAL ABLATION  2005  . MOHS SURGERY    . TUBAL LIGATION  1991    SOCIAL HISTORY: Social History  Substance Use Topics  . Smoking status: Former Research scientist (life sciences)  . Smokeless tobacco: Never Used  . Alcohol use Yes     Comment: Rare     FAMILY HISTORY: Family History  Problem Relation Age of Onset  . Hyperlipidemia Mother   . Hypertension Mother   . Cancer Father     skin  . Hyperlipidemia Father   . Dementia Father   . Arthritis Father   . Hypertension Father   . Sleep apnea Father   . Other Sister     Myelofibrosis  . Arthritis Sister   . Hyperlipidemia Sister   . Arthritis Sister   . Hyperlipidemia Sister   . Diabetes Paternal Grandfather   . Colon cancer Neg Hx     ROS: Review of Systems  Constitutional: Positive for weight loss.  Gastrointestinal: Negative for nausea.  Genitourinary: Negative for frequency.  Endo/Heme/Allergies: Negative for polydipsia.       Positive polyphagia Negative hypoglycemia    PHYSICAL EXAM: Blood pressure 133/76, pulse 73, temperature 98.5 F (36.9 C), temperature source Oral, height 5\' 3"  (1.6 m), weight 204 lb (92.5 kg), last menstrual period 12/31/2013, SpO2 99 %. Body mass index is 36.14 kg/m. Physical Exam  Constitutional: She is oriented to person, place, and time. She appears well-developed and well-nourished.  Cardiovascular: Normal rate.   Pulmonary/Chest: Effort normal.  Musculoskeletal: Normal range of motion. She exhibits edema (trace edema bilateral lower extremeties).  Neurological: She is oriented to person, place, and time.  Skin: Skin is warm and dry.  Psychiatric: She has a normal mood and affect. Her behavior is normal.    Vitals reviewed.   RECENT LABS AND TESTS: BMET    Component Value Date/Time   NA 141 07/28/2016 1600   K 3.9 07/28/2016 1600   CL 98 07/28/2016 1600   CO2 26 07/28/2016 1600   GLUCOSE 87 07/28/2016 1600   GLUCOSE 104 (H) 10/06/2008 0545   BUN 13 07/28/2016 1600   CREATININE 0.59 07/28/2016 1600   CALCIUM 9.8 07/28/2016 1600   GFRNONAA 104 07/28/2016 1600  GFRAA 119 07/28/2016 1600   Lab Results  Component Value Date   HGBA1C 6.0 (H) 07/28/2016   HGBA1C  10/07/2008    5.6 (NOTE)   The ADA recommends the following therapeutic goal for glycemic   control related to Hgb A1C measurement:   Goal of Therapy:   < 7.0% Hgb A1C   Reference: American Diabetes Association: Clinical Practice   Recommendations 2008, Diabetes Care,  2008, 31:(Suppl 1).   Lab Results  Component Value Date   INSULIN 29.2 (H) 07/28/2016   CBC    Component Value Date/Time   WBC 10.2 07/28/2016 1600   WBC 10.9 (H) 10/06/2008 0545   RBC 4.77 07/28/2016 1600   RBC 4.25 10/06/2008 0545   HGB 12.9 10/06/2008 0545   HCT 39.3 07/28/2016 1600   PLT 226 03/24/2015 1026   MCV 82 07/28/2016 1600   MCH 27.3 07/28/2016 1600   MCHC 33.1 07/28/2016 1600   MCHC 34.8 10/06/2008 0545   RDW 14.5 07/28/2016 1600   LYMPHSABS 2.9 07/28/2016 1600   MONOABS 0.7 10/05/2008 1425   EOSABS 0.2 07/28/2016 1600   BASOSABS 0.1 07/28/2016 1600   Iron/TIBC/Ferritin/ %Sat No results found for: IRON, TIBC, FERRITIN, IRONPCTSAT Lipid Panel     Component Value Date/Time   CHOL 133 07/28/2016 1600   TRIG 143 07/28/2016 1600   TRIG 156 (H) 06/06/2013 1147   HDL 47 07/28/2016 1600   HDL 50 06/06/2013 1147   CHOLHDL 3.4 06/29/2016 0822   CHOLHDL 6.1 10/07/2008 0500   VLDL 26 10/07/2008 0500   LDLCALC 57 07/28/2016 1600   LDLCALC 50 06/06/2013 1147   Hepatic Function Panel     Component Value Date/Time   PROT 7.3 07/28/2016 1600   ALBUMIN 4.5 07/28/2016 1600   AST 19 07/28/2016 1600   ALT 24 07/28/2016 1600   ALKPHOS  77 07/28/2016 1600   BILITOT 0.3 07/28/2016 1600     ASSESSMENT AND PLAN: Prediabetes - Plan: metFORMIN (GLUCOPHAGE) 500 MG tablet  At risk for diabetes mellitus  Class 2 obesity without serious comorbidity with body mass index (BMI) of 36.0 to 36.9 in adult, unspecified obesity type  PLAN:  Pre-Diabetes Heather Lam will continue to work on weight loss, exercise, and decreasing simple carbohydrates in her diet to help decrease the risk of diabetes. We dicussed metformin including benefits and risks. She was informed that eating too many simple carbohydrates or too many calories at one sitting increases the likelihood of GI side effects. Heather Lam agreed to start to take Metformin 500 mg every morning #30 with no refills. Heather Lam agreed to follow up with [place] as directed to monitor her progress.  Diabetes risk counselling Heather Lam was given extended (at least 15 minutes) diabetes prevention counseling today. She is 56 y.o. female and has risk factors for diabetes including obesity. We discussed intensive lifestyle modifications today with an emphasis on weight loss as well as increasing exercise and decreasing simple carbohydrates in her diet.  Obesity Heather Lam is currently in the action stage of change. As such, her goal is to continue with weight loss efforts She has agreed to follow the Category 3 plan Malan has been instructed to work up to a goal of 150 minutes of combined cardio and strengthening exercise per week or start home exercises, 20-30 minutes 2 to 3 times a week for weight loss and overall health benefits. We discussed the following Behavioral Modification Stratagies today: increasing lean protein intake, increasing lower sugar fruits, work on meal planning and easy  cooking plans and ways to avoid night time snacking  Maimouna has agreed to follow up with our clinic in 2 weeks. She was informed of the importance of frequent follow up visits to maximize her success with intensive lifestyle  modifications for her multiple health conditions.  I, Doreene Nest, am acting as scribe for Dennard Nip, MD  I have reviewed the above documentation for accuracy and completeness, and I agree with the above. -Dennard Nip, MD

## 2016-09-06 MED FILL — LOSARTAN-HCTZ 100-25 MG TAB: 100-25 | 90 days supply | Qty: 90 | Fill #0

## 2016-09-06 MED FILL — CLOPIDOGREL 75 MG TABLET: 75 | 90 days supply | Qty: 90 | Fill #0

## 2016-09-10 DIAGNOSIS — M79642 Pain in left hand: Secondary | ICD-10-CM | POA: Diagnosis not present

## 2016-09-14 ENCOUNTER — Ambulatory Visit (INDEPENDENT_AMBULATORY_CARE_PROVIDER_SITE_OTHER): Payer: 59 | Admitting: Family Medicine

## 2016-09-14 VITALS — BP 111/75 | HR 60 | Temp 98.0°F | Ht 63.0 in | Wt 203.0 lb

## 2016-09-14 DIAGNOSIS — E559 Vitamin D deficiency, unspecified: Secondary | ICD-10-CM | POA: Diagnosis not present

## 2016-09-14 DIAGNOSIS — E669 Obesity, unspecified: Secondary | ICD-10-CM

## 2016-09-14 DIAGNOSIS — R7303 Prediabetes: Secondary | ICD-10-CM | POA: Diagnosis not present

## 2016-09-14 DIAGNOSIS — Z6836 Body mass index (BMI) 36.0-36.9, adult: Secondary | ICD-10-CM | POA: Diagnosis not present

## 2016-09-14 MED ORDER — METFORMIN HCL 500 MG PO TABS: 500.0000 mg | ORAL_TABLET | Freq: Every day | ORAL | 0 refills | Status: DC

## 2016-09-15 NOTE — Progress Notes (Signed)
Office: [phone]  /  Fax: [phone]   HPI:   Chief Complaint: OBESITY Heather Lam is here to discuss her progress with her obesity treatment plan. She is following her eating plan approximately 75 % of the time and states she is exercising 0 minutes 0 times per week. She continues to lose weight steadily and she is sometimes skipping meals. She started to exercise but had a fall and is not exercising while recovering. Heather Lam has deviated from the plan more while recovering from surgery, due to being off her normal routine. Her weight is 203 lb (92.1 kg) today and has had a weight loss of 1 pound over a period of 2 weeks since her last visit. She has lost 12 lbs since starting treatment with [place].  Pre-Diabetes Heather Lam has a diagnosis of prediabetes based on her elevated HgA1c at 6.0 and was informed this puts her at greater risk of developing diabetes. She is stable on Metformin currently and continues to work on diet and exercise to decrease risk of diabetes. She notes decreased polyphagia and denies nausea, vomiting or hypoglycemia.  Vitamin D deficiency Heather Lam has a diagnosis of vitamin D deficiency. She is currently taking over the counter vitamin D 2,000 IU daily. She is not yet at goal. Fatigue is present but is improving. She denies nausea, vomiting or muscle weakness.   Wt Readings from Last 500 Encounters:  09/14/16 203 lb (92.1 kg)  08/31/16 204 lb (92.5 kg)  08/16/16 206 lb (93.4 kg)  08/13/16 211 lb (95.7 kg)  07/28/16 215 lb (97.5 kg)  07/06/16 220 lb 12.8 oz (100.2 kg)  06/03/16 210 lb (95.3 kg)  05/24/16 210 lb (95.3 kg)  05/05/16 210 lb (95.3 kg)  02/26/16 207 lb (93.9 kg)  02/25/16 207 lb (93.9 kg)  09/18/15 196 lb 3.2 oz (89 kg)  07/03/15 189 lb (85.7 kg)  07/01/15 189 lb (85.7 kg)  05/22/15 189 lb 1.9 oz (85.8 kg)  04/30/15 194 lb (88 kg)  03/24/15 199 lb 6.4 oz (90.4 kg)  06/20/13 215 lb (97.5 kg)  06/06/13 212 lb (96.2 kg)  05/26/12 221 lb (100.2 kg)    10/26/11 217 lb (98.4 kg)  10/13/11 217 lb (98.4 kg)  09/13/11 219 lb (99.3 kg)     ALLERGIES: No Known Allergies  MEDICATIONS: Current Outpatient Prescriptions on File Prior to Visit  Medication Sig Dispense Refill  . ALPRAZolam (XANAX) 0.25 MG tablet Take 0.25 mg by mouth as needed.     . Ascorbic Acid (VITAMIN C) 1000 MG tablet Take 1,000 mg by mouth daily.    [name] aspirin 325 MG tablet Take 325 mg by mouth daily.    [name] b complex vitamins tablet Take 1 tablet by mouth daily.    . Cholecalciferol 2000 units CAPS Take 1 capsule (2,000 Units total) by mouth daily. 30 each   . clopidogrel (PLAVIX) 75 MG tablet Take 1 tablet (75 mg total) by mouth daily. 90 tablet 1  . Coenzyme Q10 (CO Q10) 100 MG CAPS Take 1 capsule by mouth daily.    . DULoxetine (CYMBALTA) 30 MG capsule Take 2 capsules (60 mg total) by mouth daily. 180 capsule 3  . losartan-hydrochlorothiazide (HYZAAR) 100-25 MG tablet Take 1 tablet by mouth daily. 90 tablet 1  . metoprolol tartrate (LOPRESSOR) 25 MG tablet Take 0.5 tablets (12.5 mg total) by mouth 2 (two) times daily. 180 tablet 1  . nitroGLYCERIN (NITROSTAT) 0.4 MG SL tablet 1 TABLET UNDER TONGUE EVERY 5 MINUTES UP  TO 3 TIMES FOR CHEST PAIN THEN CALL DR IF NO RELIEF 25 tablet 3  . Omega-3 Fatty Acids (FISH OIL) 1000 MG CAPS 1 capsule once. Take 1  Tabs qd    . rosuvastatin (CRESTOR) 10 MG tablet Take 1 tablet (10 mg total) by mouth every other day. 90 tablet 1  . traZODone (DESYREL) 100 MG tablet Take 0.5-1 tablets (50-100 mg total) by mouth at bedtime. 90 tablet 3   No current facility-administered medications on file prior to visit.     PAST MEDICAL HISTORY: Past Medical History:  Diagnosis Date  . Anxiety   . Back pain   . CAD (coronary artery disease)   . Chest pain   . Complication of anesthesia   . Constipation   . Diverticulitis   . Diverticulosis   . Heart attack 2010  . Hyperlipemia   . Hypertension   . Joint pain   . PONV (postoperative nausea  and vomiting)   . Skin cancer   . Sleep apnea    wears CPAP nightly  . Vitamin D deficiency     PAST SURGICAL HISTORY: Past Surgical History:  Procedure Laterality Date  . APPENDECTOMY    . BACK SURGERY    . CARPAL TUNNEL RELEASE Right   . CARPAL TUNNEL RELEASE Left 08/13/2016   Procedure: LEFT CARPAL TUNNEL RELEASE;  Surgeon: Roseanne Kaufman, MD;  Location: [facility];  Service: Orthopedics;  Laterality: Left;  . CORONARY STENT PLACEMENT    . ENDOMETRIAL ABLATION  2005  . MOHS SURGERY    . TUBAL LIGATION  1991    SOCIAL HISTORY: Social History  Substance Use Topics  . Smoking status: Former Research scientist (life sciences)  . Smokeless tobacco: Never Used  . Alcohol use Yes     Comment: Rare     FAMILY HISTORY: Family History  Problem Relation Age of Onset  . Hyperlipidemia Mother   . Hypertension Mother   . Cancer Father     skin  . Hyperlipidemia Father   . Dementia Father   . Arthritis Father   . Hypertension Father   . Sleep apnea Father   . Other Sister     Myelofibrosis  . Arthritis Sister   . Hyperlipidemia Sister   . Arthritis Sister   . Hyperlipidemia Sister   . Diabetes Paternal Grandfather   . Colon cancer Neg Hx     ROS: Review of Systems  Constitutional: Positive for malaise/fatigue.  Gastrointestinal: Negative for nausea and vomiting.  Endo/Heme/Allergies:       Polyphagia Negative Hypoglycemia    PHYSICAL EXAM: Blood pressure 111/75, pulse 60, temperature 98 F (36.7 C), temperature source Oral, height 5\' 3"  (1.6 m), weight 203 lb (92.1 kg), last menstrual period 12/31/2013, SpO2 98 %. Body mass index is 35.96 kg/m. Physical Exam  Constitutional: She is oriented to person, place, and time. She appears well-developed and well-nourished.  Cardiovascular: Normal rate.   Pulmonary/Chest: Effort normal.  Musculoskeletal: Normal range of motion.  Neurological: She is oriented to person, place, and time.  Skin: Skin is warm and dry.  Psychiatric:  She has a normal mood and affect. Her behavior is normal.  Vitals reviewed.   RECENT LABS AND TESTS: BMET    Component Value Date/Time   NA 141 07/28/2016 1600   K 3.9 07/28/2016 1600   CL 98 07/28/2016 1600   CO2 26 07/28/2016 1600   GLUCOSE 87 07/28/2016 1600   GLUCOSE 104 (H) 10/06/2008 0545   BUN 13  07/28/2016 1600   CREATININE 0.59 07/28/2016 1600   CALCIUM 9.8 07/28/2016 1600   GFRNONAA 104 07/28/2016 1600   GFRAA 119 07/28/2016 1600   Lab Results  Component Value Date   HGBA1C 6.0 (H) 07/28/2016   HGBA1C  10/07/2008    5.6 (NOTE)   The ADA recommends the following therapeutic goal for glycemic   control related to Hgb A1C measurement:   Goal of Therapy:   < 7.0% Hgb A1C   Reference: American Diabetes Association: Clinical Practice   Recommendations 2008, Diabetes Care,  2008, 31:(Suppl 1).   Lab Results  Component Value Date   INSULIN 29.2 (H) 07/28/2016   CBC    Component Value Date/Time   WBC 10.2 07/28/2016 1600   WBC 10.9 (H) 10/06/2008 0545   RBC 4.77 07/28/2016 1600   RBC 4.25 10/06/2008 0545   HGB 12.9 10/06/2008 0545   HCT 39.3 07/28/2016 1600   PLT 226 03/24/2015 1026   MCV 82 07/28/2016 1600   MCH 27.3 07/28/2016 1600   MCHC 33.1 07/28/2016 1600   MCHC 34.8 10/06/2008 0545   RDW 14.5 07/28/2016 1600   LYMPHSABS 2.9 07/28/2016 1600   MONOABS 0.7 10/05/2008 1425   EOSABS 0.2 07/28/2016 1600   BASOSABS 0.1 07/28/2016 1600   Iron/TIBC/Ferritin/ %Sat No results found for: IRON, TIBC, FERRITIN, IRONPCTSAT Lipid Panel     Component Value Date/Time   CHOL 133 07/28/2016 1600   TRIG 143 07/28/2016 1600   TRIG 156 (H) 06/06/2013 1147   HDL 47 07/28/2016 1600   HDL 50 06/06/2013 1147   CHOLHDL 3.4 06/29/2016 0822   CHOLHDL 6.1 10/07/2008 0500   VLDL 26 10/07/2008 0500   LDLCALC 57 07/28/2016 1600   LDLCALC 50 06/06/2013 1147   Hepatic Function Panel     Component Value Date/Time   PROT 7.3 07/28/2016 1600   ALBUMIN 4.5 07/28/2016 1600    AST 19 07/28/2016 1600   ALT 24 07/28/2016 1600   ALKPHOS 77 07/28/2016 1600   BILITOT 0.3 07/28/2016 1600     ASSESSMENT AND PLAN: Prediabetes - Plan: metFORMIN (GLUCOPHAGE) 500 MG tablet  Vitamin D deficiency  Class 2 obesity without serious comorbidity with body mass index (BMI) of 36.0 to 36.9 in adult, unspecified obesity type  PLAN:  Pre-Diabetes Heather Lam will continue to work on weight loss, exercise, and decreasing simple carbohydrates in her diet to help decrease the risk of diabetes. We dicussed metformin including benefits and risks. She was informed that eating too many simple carbohydrates or too many calories at one sitting increases the likelihood of GI side effects. Heather Lam requested metformin for now and a prescription was written today for 1 month refill. Heather Lam agreed to follow up with [place] as directed to monitor her progress.  Vitamin D Deficiency Heather Lam was informed that low vitamin D levels contributes to fatigue and are associated with obesity, breast, and colon cancer. She agrees to continue to take over the counter Vit D @2 ,000 IU every day and will re-check labs in 6 months and will follow up for routine testing of vitamin D, at least 2-3 times per year. She was informed of the risk of over-replacement of vitamin D and agrees to not increase her dose unless he discusses this with [place] first.  Obesity Heather Lam is currently in the action stage of change. As such, her goal is to continue with weight loss efforts She has agreed to follow the Category 3 plan with more breakfast options Heather Lam has been instructed  to work up to a goal of 150 minutes of combined cardio and strengthening exercise per week for weight loss and overall health benefits. We discussed the following Behavioral Modification Stratagies today: increasing lean protein intake, increasing lower sugar fruits, work on meal planning and easy cooking plans and decrease snacking   Heather Lam has agreed to follow up  with our clinic in 2 weeks. She was informed of the importance of frequent follow up visits to maximize her success with intensive lifestyle modifications for her multiple health conditions.  I, Doreene Nest, am acting as scribe for Dennard Nip, MD  I have reviewed the above documentation for accuracy and completeness, and I agree with the above. -Dennard Nip, MD

## 2016-09-16 DIAGNOSIS — Z760 Encounter for issue of repeat prescription: Secondary | ICD-10-CM | POA: Diagnosis not present

## 2016-09-20 DIAGNOSIS — G4733 Obstructive sleep apnea (adult) (pediatric): Secondary | ICD-10-CM | POA: Diagnosis not present

## 2016-09-21 ENCOUNTER — Telehealth: Payer: Self-pay

## 2016-09-21 MED ORDER — TRAZODONE HCL 100 MG PO TABS: 50.0000 mg | ORAL_TABLET | Freq: Every day | ORAL | 3 refills | Status: DC

## 2016-09-21 MED FILL — DULoxetine HCL 30 MG CPEP: 30 | 90 days supply | Qty: 180 | Fill #1 | Status: TO

## 2016-09-21 MED FILL — ROSUVASTATIN CALCIUM 10 MG: 10 | 90 days supply | Qty: 45 | Fill #1

## 2016-09-21 MED FILL — traZODone HCL 100 MG TABS: 100 | 90 days supply | Qty: 90 | Fill #0 | Status: TO

## 2016-09-21 NOTE — Telephone Encounter (Signed)
Requesting refill on Trazodone. Send if approved

## 2016-09-21 NOTE — Telephone Encounter (Signed)
Trazodone refilled.   Laroy Apple, MD [facility] 09/21/2016, 8:28 AM

## 2016-09-27 ENCOUNTER — Other Ambulatory Visit: Payer: Self-pay | Admitting: *Deleted

## 2016-09-27 MED ORDER — METOPROLOL TARTRATE 25 MG PO TABS: 12.5000 mg | ORAL_TABLET | Freq: Two times a day (BID) | ORAL | 1 refills | Status: DC

## 2016-09-27 MED FILL — METOPROLOL TARTRATE 25 MG T: 25 | 90 days supply | Qty: 90 | Fill #0

## 2016-09-28 ENCOUNTER — Ambulatory Visit (INDEPENDENT_AMBULATORY_CARE_PROVIDER_SITE_OTHER): Payer: 59 | Admitting: Family Medicine

## 2016-09-28 VITALS — BP 106/70 | HR 78 | Temp 98.8°F | Ht 63.0 in | Wt 204.0 lb

## 2016-09-28 DIAGNOSIS — F32A Depression, unspecified: Secondary | ICD-10-CM | POA: Insufficient documentation

## 2016-09-28 DIAGNOSIS — R7303 Prediabetes: Secondary | ICD-10-CM

## 2016-09-28 DIAGNOSIS — F329 Major depressive disorder, single episode, unspecified: Secondary | ICD-10-CM | POA: Insufficient documentation

## 2016-09-28 DIAGNOSIS — E669 Obesity, unspecified: Secondary | ICD-10-CM | POA: Diagnosis not present

## 2016-09-28 DIAGNOSIS — Z6836 Body mass index (BMI) 36.0-36.9, adult: Secondary | ICD-10-CM | POA: Diagnosis not present

## 2016-09-28 DIAGNOSIS — F3289 Other specified depressive episodes: Secondary | ICD-10-CM

## 2016-09-28 MED ORDER — BUPROPION HCL ER (SR) 150 MG PO TB12: 150.0000 mg | ORAL_TABLET | Freq: Every morning | ORAL | 0 refills | Status: DC

## 2016-09-28 MED ORDER — METFORMIN HCL 500 MG PO TABS: 500.0000 mg | ORAL_TABLET | Freq: Every day | ORAL | 0 refills | Status: DC

## 2016-09-29 MED FILL — BUPROPION SR 150 MG TABLET: 150 | 30 days supply | Qty: 30 | Fill #0

## 2016-09-29 MED FILL — metFORMIN HCL 500 MG TABS: 500 | 30 days supply | Qty: 30 | Fill #0

## 2016-09-29 NOTE — Progress Notes (Signed)
Office: [phone]  /  Fax: [phone]   HPI:   Chief Complaint: OBESITY Heather Lam is here to discuss her progress with her obesity treatment plan. She is following her eating plan approximately 85 % of the time and states she is walking for exercise  2 to 3 times per week. Heather Lam had some celebration eating on vacation. She notes increased temptation especially at work. Her weight is 204 lb (92.5 kg) today and has had a weight gain of 1 lb over a period of 2 weeks since her last visit. She has lost 11 lbs since starting treatment with [place].  Depression Other Heather Lam notes increased emotional eating with increased stress at work and at home. She feels her eating is out of control and notes increased irritability, frustrated she can't seem to get control of this.  Pre-Diabetes Heather Lam has a diagnosis of prediabetes based on her elevated Hgb A1c and was informed this puts her at greater risk of developing diabetes. She is still taking metformin currently and continues to work on diet and exercise to decrease risk of diabetes. She denies nausea, vomiting or hypoglycemia.  At risk for diabetes Heather Lam is at higher than average risk for developing diabetes due to her obesity. She currently denies polyuria or polydipsia.   Wt Readings from Last 500 Encounters:  09/28/16 204 lb (92.5 kg)  09/14/16 203 lb (92.1 kg)  08/31/16 204 lb (92.5 kg)  08/16/16 206 lb (93.4 kg)  08/13/16 211 lb (95.7 kg)  07/28/16 215 lb (97.5 kg)  07/06/16 220 lb 12.8 oz (100.2 kg)  06/03/16 210 lb (95.3 kg)  05/24/16 210 lb (95.3 kg)  05/05/16 210 lb (95.3 kg)  02/26/16 207 lb (93.9 kg)  02/25/16 207 lb (93.9 kg)  09/18/15 196 lb 3.2 oz (89 kg)  07/03/15 189 lb (85.7 kg)  07/01/15 189 lb (85.7 kg)  05/22/15 189 lb 1.9 oz (85.8 kg)  04/30/15 194 lb (88 kg)  03/24/15 199 lb 6.4 oz (90.4 kg)  06/20/13 215 lb (97.5 kg)  06/06/13 212 lb (96.2 kg)  05/26/12 221 lb (100.2 kg)  10/26/11 217 lb (98.4 kg)  10/13/11  217 lb (98.4 kg)  09/13/11 219 lb (99.3 kg)     ALLERGIES: No Known Allergies  MEDICATIONS: Current Outpatient Prescriptions on File Prior to Visit  Medication Sig Dispense Refill  . ALPRAZolam (XANAX) 0.25 MG tablet Take 0.25 mg by mouth as needed.     [name] aspirin 325 MG tablet Take 325 mg by mouth daily.    [name] b complex vitamins tablet Take 1 tablet by mouth daily.    . Cholecalciferol 2000 units CAPS Take 1 capsule (2,000 Units total) by mouth daily. 30 each   . clopidogrel (PLAVIX) 75 MG tablet Take 1 tablet (75 mg total) by mouth daily. 90 tablet 1  . Coenzyme Q10 (CO Q10) 100 MG CAPS Take 1 capsule by mouth daily.    . DULoxetine (CYMBALTA) 30 MG capsule Take 2 capsules (60 mg total) by mouth daily. 180 capsule 3  . losartan-hydrochlorothiazide (HYZAAR) 100-25 MG tablet Take 1 tablet by mouth daily. 90 tablet 1  . metoprolol tartrate (LOPRESSOR) 25 MG tablet Take 0.5 tablets (12.5 mg total) by mouth 2 (two) times daily. 180 tablet 1  . nitroGLYCERIN (NITROSTAT) 0.4 MG SL tablet 1 TABLET UNDER TONGUE EVERY 5 MINUTES UP TO 3 TIMES FOR CHEST PAIN THEN CALL DR IF NO RELIEF 25 tablet 3  . Omega-3 Fatty Acids (FISH OIL) 1000 MG CAPS  1 capsule once. Take 1  Tabs qd    . rosuvastatin (CRESTOR) 10 MG tablet Take 1 tablet (10 mg total) by mouth every other day. 90 tablet 1  . traZODone (DESYREL) 100 MG tablet Take 0.5-1 tablets (50-100 mg total) by mouth at bedtime. 90 tablet 3   No current facility-administered medications on file prior to visit.     PAST MEDICAL HISTORY: Past Medical History:  Diagnosis Date  . Anxiety   . Back pain   . CAD (coronary artery disease)   . Chest pain   . Complication of anesthesia   . Constipation   . Diverticulitis   . Diverticulosis   . Heart attack 2010  . Hyperlipemia   . Hypertension   . Joint pain   . PONV (postoperative nausea and vomiting)   . Skin cancer   . Sleep apnea    wears CPAP nightly  . Vitamin D deficiency     PAST SURGICAL  HISTORY: Past Surgical History:  Procedure Laterality Date  . APPENDECTOMY    . BACK SURGERY    . CARPAL TUNNEL RELEASE Right   . CARPAL TUNNEL RELEASE Left 08/13/2016   Procedure: LEFT CARPAL TUNNEL RELEASE;  Surgeon: Roseanne Kaufman, MD;  Location: [facility];  Service: Orthopedics;  Laterality: Left;  . CORONARY STENT PLACEMENT    . ENDOMETRIAL ABLATION  2005  . MOHS SURGERY    . TUBAL LIGATION  1991    SOCIAL HISTORY: Social History  Substance Use Topics  . Smoking status: Former Research scientist (life sciences)  . Smokeless tobacco: Never Used  . Alcohol use Yes     Comment: Rare     FAMILY HISTORY: Family History  Problem Relation Age of Onset  . Hyperlipidemia Mother   . Hypertension Mother   . Cancer Father     skin  . Hyperlipidemia Father   . Dementia Father   . Arthritis Father   . Hypertension Father   . Sleep apnea Father   . Other Sister     Myelofibrosis  . Arthritis Sister   . Hyperlipidemia Sister   . Arthritis Sister   . Hyperlipidemia Sister   . Diabetes Paternal Grandfather   . Colon cancer Neg Hx     ROS: Review of Systems  Constitutional: Negative for weight loss.  Gastrointestinal: Negative for nausea and vomiting.  Genitourinary: Negative for frequency.  Endo/Heme/Allergies: Negative for polydipsia.       Negative hypoglycemia  Psychiatric/Behavioral:       Stress irritability    PHYSICAL EXAM: Blood pressure 106/70, pulse 78, temperature 98.8 F (37.1 C), temperature source Oral, height 5\' 3"  (1.6 m), weight 204 lb (92.5 kg), last menstrual period 12/31/2013, SpO2 97 %. Body mass index is 36.14 kg/m. Physical Exam  Constitutional: She is oriented to person, place, and time. She appears well-developed and well-nourished.  Cardiovascular: Normal rate.   Pulmonary/Chest: Effort normal.  Musculoskeletal: Normal range of motion.  Neurological: She is oriented to person, place, and time.  Skin: Skin is warm and dry.  Vitals  reviewed.   RECENT LABS AND TESTS: BMET    Component Value Date/Time   NA 141 07/28/2016 1600   K 3.9 07/28/2016 1600   CL 98 07/28/2016 1600   CO2 26 07/28/2016 1600   GLUCOSE 87 07/28/2016 1600   GLUCOSE 104 (H) 10/06/2008 0545   BUN 13 07/28/2016 1600   CREATININE 0.59 07/28/2016 1600   CALCIUM 9.8 07/28/2016 1600   GFRNONAA 104 07/28/2016 1600  GFRAA 119 07/28/2016 1600   Lab Results  Component Value Date   HGBA1C 6.0 (H) 07/28/2016   HGBA1C  10/07/2008    5.6 (NOTE)   The ADA recommends the following therapeutic goal for glycemic   control related to Hgb A1C measurement:   Goal of Therapy:   < 7.0% Hgb A1C   Reference: American Diabetes Association: Clinical Practice   Recommendations 2008, Diabetes Care,  2008, 31:(Suppl 1).   Lab Results  Component Value Date   INSULIN 29.2 (H) 07/28/2016   CBC    Component Value Date/Time   WBC 10.2 07/28/2016 1600   WBC 10.9 (H) 10/06/2008 0545   RBC 4.77 07/28/2016 1600   RBC 4.25 10/06/2008 0545   HGB 12.9 10/06/2008 0545   HCT 39.3 07/28/2016 1600   PLT 226 03/24/2015 1026   MCV 82 07/28/2016 1600   MCH 27.3 07/28/2016 1600   MCHC 33.1 07/28/2016 1600   MCHC 34.8 10/06/2008 0545   RDW 14.5 07/28/2016 1600   LYMPHSABS 2.9 07/28/2016 1600   MONOABS 0.7 10/05/2008 1425   EOSABS 0.2 07/28/2016 1600   BASOSABS 0.1 07/28/2016 1600   Iron/TIBC/Ferritin/ %Sat No results found for: IRON, TIBC, FERRITIN, IRONPCTSAT Lipid Panel     Component Value Date/Time   CHOL 133 07/28/2016 1600   TRIG 143 07/28/2016 1600   TRIG 156 (H) 06/06/2013 1147   HDL 47 07/28/2016 1600   HDL 50 06/06/2013 1147   CHOLHDL 3.4 06/29/2016 0822   CHOLHDL 6.1 10/07/2008 0500   VLDL 26 10/07/2008 0500   LDLCALC 57 07/28/2016 1600   LDLCALC 50 06/06/2013 1147   Hepatic Function Panel     Component Value Date/Time   PROT 7.3 07/28/2016 1600   ALBUMIN 4.5 07/28/2016 1600   AST 19 07/28/2016 1600   ALT 24 07/28/2016 1600   ALKPHOS 77  07/28/2016 1600   BILITOT 0.3 07/28/2016 1600     ASSESSMENT AND PLAN: Prediabetes - Plan: metFORMIN (GLUCOPHAGE) 500 MG tablet  Other depression - Plan: buPROPion (WELLBUTRIN SR) 150 MG 12 hr tablet  Class 2 obesity without serious comorbidity with body mass index (BMI) of 36.0 to 36.9 in adult, unspecified obesity type  PLAN:  Depression Other Rosebud agrees to start to take Wellbutrin SR 150 mg every morning #30 with no refills and will follow up in 2 weeks.  Pre-Diabetes Kyndal will continue to work on weight loss, exercise, and decreasing simple carbohydrates in her diet to help decrease the risk of diabetes. We dicussed metformin including benefits and risks. She was informed that eating too many simple carbohydrates or too many calories at one sitting increases the likelihood of GI side effects. Ilia agrees to continue to take Metformin as directed, refill for 1 month. Rachyl agreed to follow up with [place] as directed to monitor her progress.  Diabetes risk counselling Nikitta was given extended (at least 15 minutes) diabetes prevention counseling today. She is 56 y.o. female and has risk factors for diabetes including obesity. We discussed intensive lifestyle modifications today with an emphasis on weight loss as well as increasing exercise and decreasing simple carbohydrates in her diet.  Obesity Rija is currently in the action stage of change. As such, her goal is to continue with weight loss efforts She has agreed to follow the Category 3 plan Nichola has been instructed to work up to a goal of 150 minutes of combined cardio and strengthening exercise per week for weight loss and overall health benefits. We discussed the  following Behavioral Modification Stratagies today: increasing lean protein intake, increasing vegetables, increasing lower sugar fruits and emotional eating strategies  Elfrieda has agreed to follow up with our clinic in 2 weeks. She was informed of the  importance of frequent follow up visits to maximize her success with intensive lifestyle modifications for her multiple health conditions.  I, Doreene Nest, am acting as scribe for Dennard Nip, MD  I have reviewed the above documentation for accuracy and completeness, and I agree with the above. -Dennard Nip, MD

## 2016-10-18 DIAGNOSIS — G4733 Obstructive sleep apnea (adult) (pediatric): Secondary | ICD-10-CM | POA: Diagnosis not present

## 2016-10-19 ENCOUNTER — Encounter (INDEPENDENT_AMBULATORY_CARE_PROVIDER_SITE_OTHER): Payer: Self-pay | Admitting: Family Medicine

## 2016-10-19 ENCOUNTER — Ambulatory Visit (INDEPENDENT_AMBULATORY_CARE_PROVIDER_SITE_OTHER): Payer: 59 | Admitting: Family Medicine

## 2016-10-19 VITALS — BP 123/69 | HR 69 | Temp 98.1°F | Resp 14 | Ht 63.0 in | Wt 201.0 lb

## 2016-10-19 DIAGNOSIS — Z6835 Body mass index (BMI) 35.0-35.9, adult: Secondary | ICD-10-CM

## 2016-10-19 DIAGNOSIS — E669 Obesity, unspecified: Secondary | ICD-10-CM | POA: Diagnosis not present

## 2016-10-19 DIAGNOSIS — R7303 Prediabetes: Secondary | ICD-10-CM | POA: Diagnosis not present

## 2016-10-19 DIAGNOSIS — Z9189 Other specified personal risk factors, not elsewhere classified: Secondary | ICD-10-CM | POA: Diagnosis not present

## 2016-10-19 DIAGNOSIS — E559 Vitamin D deficiency, unspecified: Secondary | ICD-10-CM | POA: Diagnosis not present

## 2016-10-19 DIAGNOSIS — F3289 Other specified depressive episodes: Secondary | ICD-10-CM | POA: Diagnosis not present

## 2016-10-19 MED ORDER — BUPROPION HCL ER (SR) 150 MG PO TB12: 150.0000 mg | ORAL_TABLET | Freq: Every morning | ORAL | 0 refills | Status: DC

## 2016-10-19 MED ORDER — METFORMIN HCL 500 MG PO TABS: 500.0000 mg | ORAL_TABLET | Freq: Every day | ORAL | 0 refills | Status: DC

## 2016-10-20 NOTE — Progress Notes (Signed)
Office: [phone]  /  Fax: [phone]   HPI:   Chief Complaint: OBESITY Heather Lam is here to discuss her progress with her obesity treatment plan. She is following her eating plan approximately 65 to 70 % of the time and states she is exercising 30 minutes 2 times per week. Landen continues to do well with weight loss. She did well with planning ahead. She had some increased work Conservation officer, historic buildings and temptations but overall has handled it well. Her weight is 201 lb (91.2 kg) today and has had a weight loss of 3 pounds over a period of 3 weeks since her last visit. She has lost 14 lbs since starting treatment with [place].  Vitamin D deficiency Heather Lam has a diagnosis of vitamin D deficiency. She is currently taking OTC vit D 1,000 IU daily, not yet at goal. Fatigue is improved and denies nausea, vomiting or muscle weakness.  Depression with emotional eating behaviors Heather Lam started Wellbutrin and noted decreased emotional eating, no worsening insomnia. She noticed some mild tremors, worse when she goes too long without eating. She is struggling with emotional eating and using food for comfort to the extent that it is negatively impacting her health. She often snacks when she is not hungry. Heather Lam sometimes feels she is out of control and then feels guilty that she made poor food choices. She has been working on behavior modification techniques to help reduce her emotional eating and has been somewhat successful. Overall she is happy with the medications. She shows no sign of suicidal or homicidal ideations.  Pre-Diabetes Heather Lam has a diagnosis of prediabetes based on her elevated Hgb A1c and was informed this puts her at greater risk of developing diabetes. She is taking metformin currently and is stable on Metformin. She continues to work on diet and exercise to decrease risk of diabetes. She denies nausea or hypoglycemia.  At risk for diabetes Heather Lam is at higher than average risk for developing  diabetes due to her obesity. She currently denies polyuria or polydipsia.  Depression screen [facility] 2/9 07/28/2016 06/03/2016 05/24/2016 05/05/2016 02/26/2016  Decreased Interest 2 1 1  0 0  Down, Depressed, Hopeless 2 1 2  0 0  PHQ - 2 Score 4 2 3  0 0  Altered sleeping 1 0 0 - -  Tired, decreased energy 2 1 3  - -  Change in appetite 1 1 2  - -  Feeling bad or failure about yourself  0 0 0 - -  Trouble concentrating 0 1 1 - -  Moving slowly or fidgety/restless 0 0 0 - -  Suicidal thoughts 0 0 0 - -  PHQ-9 Score 8 5 9  - -  Difficult doing work/chores - - Somewhat difficult - -     Wt Readings from Last 500 Encounters:  10/19/16 201 lb (91.2 kg)  09/28/16 204 lb (92.5 kg)  09/14/16 203 lb (92.1 kg)  08/31/16 204 lb (92.5 kg)  08/16/16 206 lb (93.4 kg)  08/13/16 211 lb (95.7 kg)  07/28/16 215 lb (97.5 kg)  07/06/16 220 lb 12.8 oz (100.2 kg)  06/03/16 210 lb (95.3 kg)  05/24/16 210 lb (95.3 kg)  05/05/16 210 lb (95.3 kg)  02/26/16 207 lb (93.9 kg)  02/25/16 207 lb (93.9 kg)  09/18/15 196 lb 3.2 oz (89 kg)  07/03/15 189 lb (85.7 kg)  07/01/15 189 lb (85.7 kg)  05/22/15 189 lb 1.9 oz (85.8 kg)  04/30/15 194 lb (88 kg)  03/24/15 199 lb 6.4 oz (90.4 kg)  06/20/13  215 lb (97.5 kg)  06/06/13 212 lb (96.2 kg)  05/26/12 221 lb (100.2 kg)  10/26/11 217 lb (98.4 kg)  10/13/11 217 lb (98.4 kg)  09/13/11 219 lb (99.3 kg)     ALLERGIES: No Known Allergies  MEDICATIONS: Current Outpatient Prescriptions on File Prior to Visit  Medication Sig Dispense Refill  . ALPRAZolam (XANAX) 0.25 MG tablet Take 0.25 mg by mouth as needed.     [name] aspirin 325 MG tablet Take 325 mg by mouth daily.    [name] b complex vitamins tablet Take 1 tablet by mouth daily.    . Cholecalciferol 2000 units CAPS Take 1 capsule (2,000 Units total) by mouth daily. 30 each   . clopidogrel (PLAVIX) 75 MG tablet Take 1 tablet (75 mg total) by mouth daily. 90 tablet 1  . Coenzyme Q10 (CO Q10) 100 MG CAPS Take 1 capsule by mouth  daily.    . DULoxetine (CYMBALTA) 30 MG capsule Take 2 capsules (60 mg total) by mouth daily. 180 capsule 3  . losartan-hydrochlorothiazide (HYZAAR) 100-25 MG tablet Take 1 tablet by mouth daily. 90 tablet 1  . metoprolol tartrate (LOPRESSOR) 25 MG tablet Take 0.5 tablets (12.5 mg total) by mouth 2 (two) times daily. 180 tablet 1  . nitroGLYCERIN (NITROSTAT) 0.4 MG SL tablet 1 TABLET UNDER TONGUE EVERY 5 MINUTES UP TO 3 TIMES FOR CHEST PAIN THEN CALL DR IF NO RELIEF 25 tablet 3  . Omega-3 Fatty Acids (FISH OIL) 1000 MG CAPS 1 capsule once. Take 1  Tabs qd    . rosuvastatin (CRESTOR) 10 MG tablet Take 1 tablet (10 mg total) by mouth every other day. 90 tablet 1  . traZODone (DESYREL) 100 MG tablet Take 0.5-1 tablets (50-100 mg total) by mouth at bedtime. 90 tablet 3   No current facility-administered medications on file prior to visit.     PAST MEDICAL HISTORY: Past Medical History:  Diagnosis Date  . Anxiety   . Back pain   . CAD (coronary artery disease)   . Chest pain   . Complication of anesthesia   . Constipation   . Diverticulitis   . Diverticulosis   . Heart attack 2010  . Hyperlipemia   . Hypertension   . Joint pain   . PONV (postoperative nausea and vomiting)   . Skin cancer   . Sleep apnea    wears CPAP nightly  . Vitamin D deficiency     PAST SURGICAL HISTORY: Past Surgical History:  Procedure Laterality Date  . APPENDECTOMY    . BACK SURGERY    . CARPAL TUNNEL RELEASE Right   . CARPAL TUNNEL RELEASE Left 08/13/2016   Procedure: LEFT CARPAL TUNNEL RELEASE;  Surgeon: Roseanne Kaufman, MD;  Location: [facility];  Service: Orthopedics;  Laterality: Left;  . CORONARY STENT PLACEMENT    . ENDOMETRIAL ABLATION  2005  . MOHS SURGERY    . TUBAL LIGATION  1991    SOCIAL HISTORY: Social History  Substance Use Topics  . Smoking status: Former Research scientist (life sciences)  . Smokeless tobacco: Never Used  . Alcohol use Yes     Comment: Rare     FAMILY HISTORY: Family  History  Problem Relation Age of Onset  . Hyperlipidemia Mother   . Hypertension Mother   . Cancer Father     skin  . Hyperlipidemia Father   . Dementia Father   . Arthritis Father   . Hypertension Father   . Sleep apnea Father   .  Other Sister     Myelofibrosis  . Arthritis Sister   . Hyperlipidemia Sister   . Arthritis Sister   . Hyperlipidemia Sister   . Diabetes Paternal Grandfather   . Colon cancer Neg Hx     ROS: Review of Systems  Constitutional: Positive for malaise/fatigue and weight loss.  Gastrointestinal: Negative for nausea and vomiting.  Genitourinary: Negative for frequency.  Endo/Heme/Allergies: Negative for polydipsia.       Negative hypoglycemia  Psychiatric/Behavioral: Positive for depression. Negative for suicidal ideas. The patient has insomnia.     PHYSICAL EXAM: Blood pressure 123/69, pulse 69, temperature 98.1 F (36.7 C), temperature source Oral, resp. rate 14, height 5\' 3"  (1.6 m), weight 201 lb (91.2 kg), last menstrual period 12/31/2013, SpO2 96 %. Body mass index is 35.61 kg/m. Physical Exam  Constitutional: She is oriented to person, place, and time. She appears well-developed and well-nourished.  Cardiovascular: Normal rate.   Pulmonary/Chest: Effort normal.  Musculoskeletal: Normal range of motion.  Neurological: She is oriented to person, place, and time.  Skin: Skin is warm and dry.  Vitals reviewed.   RECENT LABS AND TESTS: BMET    Component Value Date/Time   NA 141 07/28/2016 1600   K 3.9 07/28/2016 1600   CL 98 07/28/2016 1600   CO2 26 07/28/2016 1600   GLUCOSE 87 07/28/2016 1600   GLUCOSE 104 (H) 10/06/2008 0545   BUN 13 07/28/2016 1600   CREATININE 0.59 07/28/2016 1600   CALCIUM 9.8 07/28/2016 1600   GFRNONAA 104 07/28/2016 1600   GFRAA 119 07/28/2016 1600   Lab Results  Component Value Date   HGBA1C 6.0 (H) 07/28/2016   HGBA1C  10/07/2008    5.6 (NOTE)   The ADA recommends the following therapeutic goal for  glycemic   control related to Hgb A1C measurement:   Goal of Therapy:   < 7.0% Hgb A1C   Reference: American Diabetes Association: Clinical Practice   Recommendations 2008, Diabetes Care,  2008, 31:(Suppl 1).   Lab Results  Component Value Date   INSULIN 29.2 (H) 07/28/2016   CBC    Component Value Date/Time   WBC 10.2 07/28/2016 1600   WBC 10.9 (H) 10/06/2008 0545   RBC 4.77 07/28/2016 1600   RBC 4.25 10/06/2008 0545   HGB 12.9 10/06/2008 0545   HCT 39.3 07/28/2016 1600   PLT 226 03/24/2015 1026   MCV 82 07/28/2016 1600   MCH 27.3 07/28/2016 1600   MCHC 33.1 07/28/2016 1600   MCHC 34.8 10/06/2008 0545   RDW 14.5 07/28/2016 1600   LYMPHSABS 2.9 07/28/2016 1600   MONOABS 0.7 10/05/2008 1425   EOSABS 0.2 07/28/2016 1600   BASOSABS 0.1 07/28/2016 1600   Iron/TIBC/Ferritin/ %Sat No results found for: IRON, TIBC, FERRITIN, IRONPCTSAT Lipid Panel     Component Value Date/Time   CHOL 133 07/28/2016 1600   TRIG 143 07/28/2016 1600   TRIG 156 (H) 06/06/2013 1147   HDL 47 07/28/2016 1600   HDL 50 06/06/2013 1147   CHOLHDL 3.4 06/29/2016 0822   CHOLHDL 6.1 10/07/2008 0500   VLDL 26 10/07/2008 0500   LDLCALC 57 07/28/2016 1600   LDLCALC 50 06/06/2013 1147   Hepatic Function Panel     Component Value Date/Time   PROT 7.3 07/28/2016 1600   ALBUMIN 4.5 07/28/2016 1600   AST 19 07/28/2016 1600   ALT 24 07/28/2016 1600   ALKPHOS 77 07/28/2016 1600   BILITOT 0.3 07/28/2016 1600     ASSESSMENT AND PLAN:  Other depression - Plan: buPROPion (WELLBUTRIN SR) 150 MG 12 hr tablet  Prediabetes - Plan: Comprehensive metabolic panel, metFORMIN (GLUCOPHAGE) 500 MG tablet  Vitamin D deficiency - Plan: VITAMIN D 25 Hydroxy (Vit-D Deficiency, Fractures)  At risk for diabetes mellitus - Plan: Hemoglobin A1c, Insulin, random  Class 2 obesity without serious comorbidity with body mass index (BMI) of 35.0 to 35.9 in adult, unspecified obesity type  PLAN:  Vitamin D Deficiency Heather Lam  was informed that low vitamin D levels contributes to fatigue and are associated with obesity, breast, and colon cancer. She agrees to continue to take OTC Vit D @1 ,000 IU every day and we will check labs and will follow up for routine testing of vitamin D, at least 2-3 times per year. She was informed of the risk of over-replacement of vitamin D and agrees to not increase her dose unless he discusses this with [place] first.  Depression with Emotional Eating Behaviors We discussed behavior modification techniques today to help Heather Lam deal with her emotional eating and depression. She has agreed to continue to take Wellbutrin SR 150 mg qd, we will refill for 1 month and she agreed to follow up as directed.  Pre-Diabetes Heather Lam will continue to work on weight loss, exercise, and decreasing simple carbohydrates in her diet to help decrease the risk of diabetes. We dicussed metformin including benefits and risks. She was informed that eating too many simple carbohydrates or too many calories at one sitting increases the likelihood of GI side effects. Heather Lam requested metformin for now and a prescription was written today for 1 month refill. Heather Lam agreed to follow up with [place] as directed to monitor her progress.  Diabetes risk counselling Heather Lam was given extended (at least 15 minutes) diabetes prevention counseling today. She is 56 y.o. female and has risk factors for diabetes including obesity. We discussed intensive lifestyle modifications today with an emphasis on weight loss as well as increasing exercise and decreasing simple carbohydrates in her diet.  Obesity Heather Lam is currently in the action stage of change. As such, her goal is to continue with weight loss efforts She has agreed to follow the Category 3 plan Heather Lam has been instructed to work up to a goal of 150 minutes of combined cardio and strengthening exercise per week or continue walking 30 minutes 2 times per week for weight loss and overall  health benefits. We discussed the following Behavioral Modification Stratagies today: increasing lean protein intake, decreasing simple carbohydrates , increasing vegetables and decrease eating out  Heather Lam has agreed to follow up with our clinic in 2 weeks. She was informed of the importance of frequent follow up visits to maximize her success with intensive lifestyle modifications for her multiple health conditions.  I, Doreene Nest, am acting as scribe for Dennard Nip, MD  I have reviewed the above documentation for accuracy and completeness, and I agree with the above. -Dennard Nip, MD

## 2016-10-22 ENCOUNTER — Other Ambulatory Visit: Payer: Self-pay | Admitting: Family Medicine

## 2016-10-22 DIAGNOSIS — J0110 Acute frontal sinusitis, unspecified: Secondary | ICD-10-CM

## 2016-10-22 MED ORDER — AZITHROMYCIN 250 MG PO TABS: ORAL_TABLET | ORAL | 0 refills | Status: DC

## 2016-10-22 MED ORDER — METHYLPREDNISOLONE ACETATE 80 MG/ML IJ SUSP: 80.0000 mg | Freq: Once | INTRAMUSCULAR | Status: DC

## 2016-10-22 MED ORDER — HYDROCODONE-HOMATROPINE 5-1.5 MG/5ML PO SYRP: 5.0000 mL | ORAL_SOLUTION | Freq: Four times a day (QID) | ORAL | 0 refills | Status: DC | PRN

## 2016-10-25 MED FILL — metFORMIN HCL 500 MG TABS: 500 | 30 days supply | Qty: 30 | Fill #0

## 2016-10-25 MED FILL — BUPROPION SR 150 MG TABLET: 150 | 30 days supply | Qty: 30 | Fill #0

## 2016-10-26 ENCOUNTER — Encounter: Payer: Self-pay | Admitting: Physician Assistant

## 2016-10-26 ENCOUNTER — Ambulatory Visit (INDEPENDENT_AMBULATORY_CARE_PROVIDER_SITE_OTHER): Payer: 59 | Admitting: Physician Assistant

## 2016-10-26 VITALS — BP 115/69 | HR 59 | Temp 96.7°F | Ht 63.0 in | Wt 201.0 lb

## 2016-10-26 DIAGNOSIS — Z9989 Dependence on other enabling machines and devices: Secondary | ICD-10-CM

## 2016-10-26 DIAGNOSIS — G4733 Obstructive sleep apnea (adult) (pediatric): Secondary | ICD-10-CM | POA: Diagnosis not present

## 2016-10-26 NOTE — Progress Notes (Signed)
BP 115/69   Pulse (!) 59   Temp (!) 96.7 F (35.9 C) (Oral)   Ht 5\' 3"  (1.6 m)   Wt 201 lb (91.2 kg)   LMP 12/31/2013   BMI 35.61 kg/m    Subjective:    Patient ID: Heather Lam, female    DOB: [birth year], 56 y.o.   MRN: [id number]  HPI: AYDIA MAJ is a 56 y.o. female presenting on 10/26/2016 for Follow-up (Discuss CPAP)  This patient comes in for periodic recheck on medications and conditions including CPAP. Reports that the ise of her CPAP for her OSA is greatly beneficial.  Prior to starting the CPAP was having excessive fatigue and hypersomnolence during the day. Ever since she had started using it she's had a great improvement in this. She is waking up feeling better. She is not having daytime sleepiness. She has not used it for the last night because of an upper respiratory infection. Otherwise she has been using it nightly as directed. She is very compliant with her use.  All medications are reviewed today. There are no reports of any problems with the medications. All of the medical conditions are reviewed and updated.  Lab work is reviewed and will be ordered as medically necessary. There are no new problems reported with today's visit.   Relevant past medical, surgical, family and social history reviewed and updated as indicated. Allergies and medications reviewed and updated.  Past Medical History:  Diagnosis Date  . Anxiety   . Back pain   . CAD (coronary artery disease)   . Chest pain   . Complication of anesthesia   . Constipation   . Diverticulitis   . Diverticulosis   . Heart attack 2010  . Hyperlipemia   . Hypertension   . Joint pain   . PONV (postoperative nausea and vomiting)   . Skin cancer   . Sleep apnea    wears CPAP nightly  . Vitamin D deficiency     Past Surgical History:  Procedure Laterality Date  . APPENDECTOMY    . BACK SURGERY    . CARPAL TUNNEL RELEASE Right   . CARPAL TUNNEL RELEASE Left 08/13/2016   Procedure: LEFT CARPAL  TUNNEL RELEASE;  Surgeon: Roseanne Kaufman, MD;  Location: [facility];  Service: Orthopedics;  Laterality: Left;  . CORONARY STENT PLACEMENT    . ENDOMETRIAL ABLATION  2005  . MOHS SURGERY    . TUBAL LIGATION  1991    Review of Systems  Constitutional: Negative.  Negative for activity change, fatigue and fever.  HENT: Negative.   Eyes: Negative.   Respiratory: Negative.  Negative for cough.   Cardiovascular: Negative.  Negative for chest pain.  Gastrointestinal: Negative.  Negative for abdominal pain.  Endocrine: Negative.   Genitourinary: Negative.  Negative for dysuria.  Musculoskeletal: Negative.   Skin: Negative.   Neurological: Negative.     Allergies as of 10/26/2016   No Known Allergies     Medication List       Accurate as of 10/26/16 12:03 PM. Always use your most recent med list.          ALPRAZolam 0.25 MG tablet Commonly known as:  XANAX Take 0.25 mg by mouth as needed.   aspirin 325 MG tablet Take 325 mg by mouth daily.   azithromycin 250 MG tablet Commonly known as:  ZITHROMAX Take 2 the first day and then one each day after.   b complex vitamins tablet Take  1 tablet by mouth daily.   buPROPion 150 MG 12 hr tablet Commonly known as:  WELLBUTRIN SR Take 1 tablet (150 mg total) by mouth every morning.   Cholecalciferol 2000 units Caps Take 1 capsule (2,000 Units total) by mouth daily.   clopidogrel 75 MG tablet Commonly known as:  PLAVIX Take 1 tablet (75 mg total) by mouth daily.   Co Q10 100 MG Caps Take 1 capsule by mouth daily.   DULoxetine 30 MG capsule Commonly known as:  CYMBALTA Take 2 capsules (60 mg total) by mouth daily.   Fish Oil 1000 MG Caps 1 capsule once. Take 1  Tabs qd   HYDROcodone-homatropine 5-1.5 MG/5ML syrup Commonly known as:  HYCODAN Take 5 mLs by mouth every 6 (six) hours as needed for cough.   losartan-hydrochlorothiazide 100-25 MG tablet Commonly known as:  HYZAAR Take 1 tablet by mouth  daily.   metFORMIN 500 MG tablet Commonly known as:  GLUCOPHAGE Take 1 tablet (500 mg total) by mouth daily with breakfast.   metoprolol tartrate 25 MG tablet Commonly known as:  LOPRESSOR Take 0.5 tablets (12.5 mg total) by mouth 2 (two) times daily.   nitroGLYCERIN 0.4 MG SL tablet Commonly known as:  NITROSTAT 1 TABLET UNDER TONGUE EVERY 5 MINUTES UP TO 3 TIMES FOR CHEST PAIN THEN CALL DR IF NO RELIEF   rosuvastatin 10 MG tablet Commonly known as:  CRESTOR Take 1 tablet (10 mg total) by mouth every other day.   traZODone 100 MG tablet Commonly known as:  DESYREL Take 0.5-1 tablets (50-100 mg total) by mouth at bedtime.          Objective:    BP 115/69   Pulse (!) 59   Temp (!) 96.7 F (35.9 C) (Oral)   Ht 5\' 3"  (1.6 m)   Wt 201 lb (91.2 kg)   LMP 12/31/2013   BMI 35.61 kg/m   No Known Allergies  Physical Exam  Constitutional: She is oriented to person, place, and time. She appears well-developed and well-nourished.  HENT:  Head: Normocephalic and atraumatic.  Right Ear: Tympanic membrane, external ear and ear canal normal.  Left Ear: Tympanic membrane, external ear and ear canal normal.  Nose: Nose normal. No rhinorrhea.  Mouth/Throat: Oropharynx is clear and moist and mucous membranes are normal. No oropharyngeal exudate or posterior oropharyngeal erythema.  Eyes: Conjunctivae and EOM are normal. Pupils are equal, round, and reactive to light.  Neck: Normal range of motion. Neck supple.  Cardiovascular: Normal rate, regular rhythm, normal heart sounds and intact distal pulses.   Pulmonary/Chest: Effort normal and breath sounds normal.  Abdominal: Soft. Bowel sounds are normal.  Neurological: She is alert and oriented to person, place, and time. She has normal reflexes.  Skin: Skin is warm and dry. No rash noted.  Psychiatric: She has a normal mood and affect. Her behavior is normal. Judgment and thought content normal.    Results for orders placed or  performed in visit on 07/28/16  Comprehensive metabolic panel  Result Value Ref Range   Glucose 87 65 - 99 mg/dL   BUN 13 6 - 24 mg/dL   Creatinine, Ser 0.59 0.57 - 1.00 mg/dL   GFR calc non Af Amer 104 >59 mL/min/1.73   GFR calc Af Amer 119 >59 mL/min/1.73   BUN/Creatinine Ratio 22 9 - 23   Sodium 141 134 - 144 mmol/L   Potassium 3.9 3.5 - 5.2 mmol/L   Chloride 98 96 - 106 mmol/L  CO2 26 18 - 29 mmol/L   Calcium 9.8 8.7 - 10.2 mg/dL   Total Protein 7.3 6.0 - 8.5 g/dL   Albumin 4.5 3.5 - 5.5 g/dL   Globulin, Total 2.8 1.5 - 4.5 g/dL   Albumin/Globulin Ratio 1.6 1.2 - 2.2   Bilirubin Total 0.3 0.0 - 1.2 mg/dL   Alkaline Phosphatase 77 39 - 117 IU/L   AST 19 0 - 40 IU/L   ALT 24 0 - 32 IU/L  CBC With Differential  Result Value Ref Range   WBC 10.2 3.4 - 10.8 x10E3/uL   RBC 4.77 3.77 - 5.28 x10E6/uL   Hemoglobin 13.0 11.1 - 15.9 g/dL   Hematocrit 39.3 34.0 - 46.6 %   MCV 82 79 - 97 fL   MCH 27.3 26.6 - 33.0 pg   MCHC 33.1 31.5 - 35.7 g/dL   RDW 14.5 12.3 - 15.4 %   Neutrophils 64 Not Estab. %   Lymphs 28 Not Estab. %   Monocytes 5 Not Estab. %   Eos 2 Not Estab. %   Basos 1 Not Estab. %   Neutrophils Absolute 6.6 1.4 - 7.0 x10E3/uL   Lymphocytes Absolute 2.9 0.7 - 3.1 x10E3/uL   Monocytes Absolute 0.5 0.1 - 0.9 x10E3/uL   EOS (ABSOLUTE) 0.2 0.0 - 0.4 x10E3/uL   Basophils Absolute 0.1 0.0 - 0.2 x10E3/uL   Immature Granulocytes 0 Not Estab. %   Immature Grans (Abs) 0.0 0.0 - 0.1 x10E3/uL  Hemoglobin A1c  Result Value Ref Range   Hgb A1c MFr Bld 6.0 (H) 4.8 - 5.6 %   Est. average glucose Bld gHb Est-mCnc 126 mg/dL  Insulin, random  Result Value Ref Range   INSULIN 29.2 (H) 2.6 - 24.9 uIU/mL  Lipid Panel With LDL/HDL Ratio  Result Value Ref Range   Cholesterol, Total 133 100 - 199 mg/dL   Triglycerides 143 0 - 149 mg/dL   HDL 47 >39 mg/dL   VLDL Cholesterol Cal 29 5 - 40 mg/dL   LDL Calculated 57 0 - 99 mg/dL   LDl/HDL Ratio 1.2 0.0 - 3.2 ratio units  Folate   Result Value Ref Range   Folate 15.6 >3.0 ng/mL  VITAMIN D 25 Hydroxy (Vit-D Deficiency, Fractures)  Result Value Ref Range   Vit D, 25-Hydroxy 44.9 30.0 - 100.0 ng/mL  Vitamin B12  Result Value Ref Range   Vitamin B-12 660 232 - 1,245 pg/mL      Assessment & Plan:   1. OSA on CPAP Continue current settings and recheck every 6 months for status   Current Outpatient Prescriptions:  .  ALPRAZolam (XANAX) 0.25 MG tablet, Take 0.25 mg by mouth as needed. , Disp: , Rfl:  .  aspirin 325 MG tablet, Take 325 mg by mouth daily., Disp: , Rfl:  .  azithromycin (ZITHROMAX) 250 MG tablet, Take 2 the first day and then one each day after., Disp: 6 tablet, Rfl: 0 .  b complex vitamins tablet, Take 1 tablet by mouth daily., Disp: , Rfl:  .  buPROPion (WELLBUTRIN SR) 150 MG 12 hr tablet, Take 1 tablet (150 mg total) by mouth every morning., Disp: 30 tablet, Rfl: 0 .  Cholecalciferol 2000 units CAPS, Take 1 capsule (2,000 Units total) by mouth daily., Disp: 30 each, Rfl:  .  clopidogrel (PLAVIX) 75 MG tablet, Take 1 tablet (75 mg total) by mouth daily., Disp: 90 tablet, Rfl: 1 .  Coenzyme Q10 (CO Q10) 100 MG CAPS, Take 1 capsule by  mouth daily., Disp: , Rfl:  .  DULoxetine (CYMBALTA) 30 MG capsule, Take 2 capsules (60 mg total) by mouth daily., Disp: 180 capsule, Rfl: 3 .  HYDROcodone-homatropine (HYCODAN) 5-1.5 MG/5ML syrup, Take 5 mLs by mouth every 6 (six) hours as needed for cough., Disp: 120 mL, Rfl: 0 .  losartan-hydrochlorothiazide (HYZAAR) 100-25 MG tablet, Take 1 tablet by mouth daily., Disp: 90 tablet, Rfl: 1 .  metFORMIN (GLUCOPHAGE) 500 MG tablet, Take 1 tablet (500 mg total) by mouth daily with breakfast., Disp: 30 tablet, Rfl: 0 .  metoprolol tartrate (LOPRESSOR) 25 MG tablet, Take 0.5 tablets (12.5 mg total) by mouth 2 (two) times daily., Disp: 180 tablet, Rfl: 1 .  nitroGLYCERIN (NITROSTAT) 0.4 MG SL tablet, 1 TABLET UNDER TONGUE EVERY 5 MINUTES UP TO 3 TIMES FOR CHEST PAIN THEN CALL DR  IF NO RELIEF, Disp: 25 tablet, Rfl: 3 .  Omega-3 Fatty Acids (FISH OIL) 1000 MG CAPS, 1 capsule once. Take 1  Tabs qd, Disp: , Rfl:  .  rosuvastatin (CRESTOR) 10 MG tablet, Take 1 tablet (10 mg total) by mouth every other day., Disp: 90 tablet, Rfl: 1 .  traZODone (DESYREL) 100 MG tablet, Take 0.5-1 tablets (50-100 mg total) by mouth at bedtime., Disp: 90 tablet, Rfl: 3  Current Facility-Administered Medications:  .  methylPREDNISolone acetate (DEPO-MEDROL) injection 80 mg, 80 mg, Intramuscular, Once, Worthy Rancher, MD  Continue all other maintenance medications as listed above.  Follow up plan: Recheck 6 months  Educational handout given for sleep apnea  Terald Sleeper PA-C [facility] [address]  [place], [place] [address] [phone]   10/26/2016, 12:03 PM

## 2016-10-26 NOTE — Patient Instructions (Signed)

## 2016-10-28 ENCOUNTER — Other Ambulatory Visit: Payer: Self-pay | Admitting: *Deleted

## 2016-10-28 MED ORDER — BENZONATATE 200 MG PO CAPS: 200.0000 mg | ORAL_CAPSULE | Freq: Two times a day (BID) | ORAL | 1 refills | Status: DC | PRN

## 2016-10-29 DIAGNOSIS — H524 Presbyopia: Secondary | ICD-10-CM | POA: Diagnosis not present

## 2016-11-01 DIAGNOSIS — R7303 Prediabetes: Secondary | ICD-10-CM | POA: Diagnosis not present

## 2016-11-01 DIAGNOSIS — E559 Vitamin D deficiency, unspecified: Secondary | ICD-10-CM | POA: Diagnosis not present

## 2016-11-01 DIAGNOSIS — Z9189 Other specified personal risk factors, not elsewhere classified: Secondary | ICD-10-CM | POA: Diagnosis not present

## 2016-11-03 ENCOUNTER — Encounter (INDEPENDENT_AMBULATORY_CARE_PROVIDER_SITE_OTHER): Payer: Self-pay | Admitting: Family Medicine

## 2016-11-03 ENCOUNTER — Ambulatory Visit (INDEPENDENT_AMBULATORY_CARE_PROVIDER_SITE_OTHER): Payer: 59 | Admitting: Family Medicine

## 2016-11-03 VITALS — BP 117/73 | HR 65 | Temp 97.8°F | Ht 63.0 in | Wt 196.0 lb

## 2016-11-03 DIAGNOSIS — E669 Obesity, unspecified: Secondary | ICD-10-CM | POA: Diagnosis not present

## 2016-11-03 DIAGNOSIS — E66811 Obesity, class 1: Secondary | ICD-10-CM | POA: Insufficient documentation

## 2016-11-03 DIAGNOSIS — R7303 Prediabetes: Secondary | ICD-10-CM

## 2016-11-03 DIAGNOSIS — Z6834 Body mass index (BMI) 34.0-34.9, adult: Secondary | ICD-10-CM | POA: Diagnosis not present

## 2016-11-03 DIAGNOSIS — E559 Vitamin D deficiency, unspecified: Secondary | ICD-10-CM | POA: Diagnosis not present

## 2016-11-03 DIAGNOSIS — Z9189 Other specified personal risk factors, not elsewhere classified: Secondary | ICD-10-CM | POA: Diagnosis not present

## 2016-11-03 DIAGNOSIS — F3289 Other specified depressive episodes: Secondary | ICD-10-CM

## 2016-11-03 LAB — HEMOGLOBIN A1C
Est. average glucose Bld gHb Est-mCnc: 120 mg/dL
Hgb A1c MFr Bld: 5.8 % — ABNORMAL HIGH (ref 4.8–5.6)

## 2016-11-03 LAB — COMPREHENSIVE METABOLIC PANEL
ALT: 21 IU/L (ref 0–32)
AST: 18 IU/L (ref 0–40)
Albumin/Globulin Ratio: 1.8 (ref 1.2–2.2)
Albumin: 4.4 g/dL (ref 3.5–5.5)
Alkaline Phosphatase: 65 IU/L (ref 39–117)
BUN/Creatinine Ratio: 20 (ref 9–23)
BUN: 14 mg/dL (ref 6–24)
Bilirubin Total: 0.3 mg/dL (ref 0.0–1.2)
CO2: 26 mmol/L (ref 18–29)
Calcium: 9.6 mg/dL (ref 8.7–10.2)
Chloride: 100 mmol/L (ref 96–106)
Creatinine, Ser: 0.69 mg/dL (ref 0.57–1.00)
GFR calc Af Amer: 113 mL/min/{1.73_m2} (ref >59)
GFR calc non Af Amer: 98 mL/min/{1.73_m2} (ref >59)
Globulin, Total: 2.4 g/dL (ref 1.5–4.5)
Glucose: 90 mg/dL (ref 65–99)
Potassium: 4.3 mmol/L (ref 3.5–5.2)
Sodium: 142 mmol/L (ref 134–144)
Total Protein: 6.8 g/dL (ref 6.0–8.5)

## 2016-11-03 LAB — VITAMIN D 25 HYDROXY (VIT D DEFICIENCY, FRACTURES): Vit D, 25-Hydroxy: 49.8 ng/mL (ref 30.0–100.0)

## 2016-11-03 LAB — INSULIN, RANDOM: INSULIN: 19.6 u[IU]/mL (ref 2.6–24.9)

## 2016-11-03 MED ORDER — METFORMIN HCL 500 MG PO TABS: 500.0000 mg | ORAL_TABLET | Freq: Every day | ORAL | 0 refills | Status: DC

## 2016-11-03 MED ORDER — BUPROPION HCL ER (SR) 150 MG PO TB12: 150.0000 mg | ORAL_TABLET | Freq: Every morning | ORAL | 0 refills | Status: DC

## 2016-11-04 NOTE — Progress Notes (Signed)
Office: [phone]  /  Fax: [phone]   HPI:   Chief Complaint: OBESITY Heather Lam is here to discuss her progress with her obesity treatment plan. She is following her eating plan approximately 80 % of the time and states she is exercising 30 to 40 minutes 3 times per week. Heather Lam is doing well with weight loss efforts. She had some celebration eating and has increased walking. Hunger is controlled and patient notes improved fatigue. Her weight is 196 lb (88.9 kg) today and has had a weight loss of 5 pounds over a period of 2 weeks since her last visit. She has lost 19 lbs since starting treatment with [place].  Vitamin D deficiency Heather Lam has a diagnosis of vitamin D deficiency. She is currently taking OTC vit D 1,000 IU daily and is now at goal. [employer] denies nausea, vomiting or muscle weakness.  Pre-Diabetes Heather Lam has a diagnosis of prediabetes based on her elevated Hgb A1c, improved with diet, weight loss and Metformin. Heather Lam was informed this puts her at greater risk of developing diabetes. She is taking metformin currently and continues to work on diet and exercise to decrease risk of diabetes. She denies nausea, vomiting or hypoglycemia.  At risk for diabetes Heather Lam is at higher than average risk for developing diabetes due to her obesity and pre-diabetes. She currently denies polyuria or polydipsia.  Depression with emotional eating behaviors Heather Lam is struggling with emotional eating and using food for comfort to the extent that it is negatively impacting her health. She often snacks when she is not hungry. Heather Lam sometimes feels she is out of control and then feels guilty that she made poor food choices. Heather Lam is stable on Wellbutrin, mood is improved, fatigue improved and emotional eating decreased. She has no insomnia and blood pressure is stable. She has been working on behavior modification techniques to help reduce her emotional eating and has been somewhat successful. She  shows no sign of suicidal or homicidal ideations.  Depression screen [facility] 2/9 07/28/2016 06/03/2016 05/24/2016 05/05/2016 02/26/2016  Decreased Interest 2 1 1  0 0  Down, Depressed, Hopeless 2 1 2  0 0  PHQ - 2 Score 4 2 3  0 0  Altered sleeping 1 0 0 - -  Tired, decreased energy 2 1 3  - -  Change in appetite 1 1 2  - -  Feeling bad or failure about yourself  0 0 0 - -  Trouble concentrating 0 1 1 - -  Moving slowly or fidgety/restless 0 0 0 - -  Suicidal thoughts 0 0 0 - -  PHQ-9 Score 8 5 9  - -  Difficult doing work/chores - - Somewhat difficult - -     Wt Readings from Last 500 Encounters:  11/03/16 196 lb (88.9 kg)  10/26/16 201 lb (91.2 kg)  10/19/16 201 lb (91.2 kg)  09/28/16 204 lb (92.5 kg)  09/14/16 203 lb (92.1 kg)  08/31/16 204 lb (92.5 kg)  08/16/16 206 lb (93.4 kg)  08/13/16 211 lb (95.7 kg)  07/28/16 215 lb (97.5 kg)  07/06/16 220 lb 12.8 oz (100.2 kg)  06/03/16 210 lb (95.3 kg)  05/24/16 210 lb (95.3 kg)  05/05/16 210 lb (95.3 kg)  02/26/16 207 lb (93.9 kg)  02/25/16 207 lb (93.9 kg)  09/18/15 196 lb 3.2 oz (89 kg)  07/03/15 189 lb (85.7 kg)  07/01/15 189 lb (85.7 kg)  05/22/15 189 lb 1.9 oz (85.8 kg)  04/30/15 194 lb (88 kg)  03/24/15 199 lb 6.4 oz (  90.4 kg)  06/20/13 215 lb (97.5 kg)  06/06/13 212 lb (96.2 kg)  05/26/12 221 lb (100.2 kg)  10/26/11 217 lb (98.4 kg)  10/13/11 217 lb (98.4 kg)  09/13/11 219 lb (99.3 kg)     ALLERGIES: No Known Allergies  MEDICATIONS: Current Outpatient Prescriptions on File Prior to Visit  Medication Sig Dispense Refill  . ALPRAZolam (XANAX) 0.25 MG tablet Take 0.25 mg by mouth as needed.     [name] aspirin 325 MG tablet Take 325 mg by mouth daily.    [name] azithromycin (ZITHROMAX) 250 MG tablet Take 2 the first day and then one each day after. (Patient not taking: Reported on 11/03/2016) 6 tablet 0  . b complex vitamins tablet Take 1 tablet by mouth daily.    . benzonatate (TESSALON) 200 MG capsule Take 1 capsule (200 mg total)  by mouth 2 (two) times daily as needed for cough. (Patient not taking: Reported on 11/03/2016) 60 capsule 1  . Cholecalciferol 2000 units CAPS Take 1 capsule (2,000 Units total) by mouth daily. 30 each   . clopidogrel (PLAVIX) 75 MG tablet Take 1 tablet (75 mg total) by mouth daily. 90 tablet 1  . Coenzyme Q10 (CO Q10) 100 MG CAPS Take 1 capsule by mouth daily.    . DULoxetine (CYMBALTA) 30 MG capsule Take 2 capsules (60 mg total) by mouth daily. 180 capsule 3  . HYDROcodone-homatropine (HYCODAN) 5-1.5 MG/5ML syrup Take 5 mLs by mouth every 6 (six) hours as needed for cough. (Patient not taking: Reported on 11/03/2016) 120 mL 0  . losartan-hydrochlorothiazide (HYZAAR) 100-25 MG tablet Take 1 tablet by mouth daily. 90 tablet 1  . metoprolol tartrate (LOPRESSOR) 25 MG tablet Take 0.5 tablets (12.5 mg total) by mouth 2 (two) times daily. 180 tablet 1  . nitroGLYCERIN (NITROSTAT) 0.4 MG SL tablet 1 TABLET UNDER TONGUE EVERY 5 MINUTES UP TO 3 TIMES FOR CHEST PAIN THEN CALL DR IF NO RELIEF 25 tablet 3  . Omega-3 Fatty Acids (FISH OIL) 1000 MG CAPS 1 capsule once. Take 1  Tabs qd    . rosuvastatin (CRESTOR) 10 MG tablet Take 1 tablet (10 mg total) by mouth every other day. 90 tablet 1  . traZODone (DESYREL) 100 MG tablet Take 0.5-1 tablets (50-100 mg total) by mouth at bedtime. 90 tablet 3   Current Facility-Administered Medications on File Prior to Visit  Medication Dose Route Frequency Provider Last Rate Last Dose  . methylPREDNISolone acetate (DEPO-MEDROL) injection 80 mg  80 mg Intramuscular Once Worthy Rancher, MD        PAST MEDICAL HISTORY: Past Medical History:  Diagnosis Date  . Anxiety   . Back pain   . CAD (coronary artery disease)   . Chest pain   . Complication of anesthesia   . Constipation   . Diverticulitis   . Diverticulosis   . Heart attack 2010  . Hyperlipemia   . Hypertension   . Joint pain   . PONV (postoperative nausea and vomiting)   . Skin cancer   . Sleep apnea      wears CPAP nightly  . Vitamin D deficiency     PAST SURGICAL HISTORY: Past Surgical History:  Procedure Laterality Date  . APPENDECTOMY    . BACK SURGERY    . CARPAL TUNNEL RELEASE Right   . CARPAL TUNNEL RELEASE Left 08/13/2016   Procedure: LEFT CARPAL TUNNEL RELEASE;  Surgeon: Roseanne Kaufman, MD;  Location: [place];  Service: Orthopedics;  Laterality: Left;  . CORONARY STENT PLACEMENT    . ENDOMETRIAL ABLATION  2005  . MOHS SURGERY    . TUBAL LIGATION  1991    SOCIAL HISTORY: Social History  Substance Use Topics  . Smoking status: Former Research scientist (life sciences)  . Smokeless tobacco: Never Used  . Alcohol use Yes     Comment: Rare     FAMILY HISTORY: Family History  Problem Relation Age of Onset  . Hyperlipidemia Mother   . Hypertension Mother   . Cancer Father     skin  . Hyperlipidemia Father   . Dementia Father   . Arthritis Father   . Hypertension Father   . Sleep apnea Father   . Other Sister     Myelofibrosis  . Arthritis Sister   . Hyperlipidemia Sister   . Arthritis Sister   . Hyperlipidemia Sister   . Diabetes Paternal Grandfather   . Colon cancer Neg Hx     ROS: Review of Systems  Constitutional: Positive for malaise/fatigue and weight loss.  Gastrointestinal: Negative for nausea and vomiting.  Genitourinary: Negative for frequency.  Musculoskeletal:       Negative muscle weakness  Endo/Heme/Allergies: Negative for polydipsia.       Negative hypoglycemia  Psychiatric/Behavioral: Positive for depression. Negative for suicidal ideas. The patient does not have insomnia.     PHYSICAL EXAM: Blood pressure 117/73, pulse 65, temperature 97.8 F (36.6 C), temperature source Oral, height 5\' 3"  (1.6 m), weight 196 lb (88.9 kg), last menstrual period 12/31/2013, SpO2 95 %. Body mass index is 34.72 kg/m. Physical Exam  Constitutional: She is oriented to person, place, and time. She appears well-developed and well-nourished.  Cardiovascular: Normal  rate.   Pulmonary/Chest: Effort normal.  Musculoskeletal: Normal range of motion.  Neurological: She is oriented to person, place, and time.  Skin: Skin is warm and dry.  Vitals reviewed.   RECENT LABS AND TESTS: BMET    Component Value Date/Time   NA 142 11/01/2016 1307   K 4.3 11/01/2016 1307   CL 100 11/01/2016 1307   CO2 26 11/01/2016 1307   GLUCOSE 90 11/01/2016 1307   GLUCOSE 104 (H) 10/06/2008 0545   BUN 14 11/01/2016 1307   CREATININE 0.69 11/01/2016 1307   CALCIUM 9.6 11/01/2016 1307   GFRNONAA 98 11/01/2016 1307   GFRAA 113 11/01/2016 1307   Lab Results  Component Value Date   HGBA1C 5.8 (H) 11/01/2016   HGBA1C 6.0 (H) 07/28/2016   HGBA1C  10/07/2008    5.6 (NOTE)   The ADA recommends the following therapeutic goal for glycemic   control related to Hgb A1C measurement:   Goal of Therapy:   < 7.0% Hgb A1C   Reference: American Diabetes Association: Clinical Practice   Recommendations 2008, Diabetes Care,  2008, 31:(Suppl 1).   Lab Results  Component Value Date   INSULIN 19.6 11/01/2016   INSULIN 29.2 (H) 07/28/2016   CBC    Component Value Date/Time   WBC 10.2 07/28/2016 1600   WBC 10.9 (H) 10/06/2008 0545   RBC 4.77 07/28/2016 1600   RBC 4.25 10/06/2008 0545   HGB 12.9 10/06/2008 0545   HCT 39.3 07/28/2016 1600   PLT 226 03/24/2015 1026   MCV 82 07/28/2016 1600   MCH 27.3 07/28/2016 1600   MCHC 33.1 07/28/2016 1600   MCHC 34.8 10/06/2008 0545   RDW 14.5 07/28/2016 1600   LYMPHSABS 2.9 07/28/2016 1600   MONOABS 0.7 10/05/2008 1425   EOSABS 0.2 07/28/2016 1600  BASOSABS 0.1 07/28/2016 1600   Iron/TIBC/Ferritin/ %Sat No results found for: IRON, TIBC, FERRITIN, IRONPCTSAT Lipid Panel     Component Value Date/Time   CHOL 133 07/28/2016 1600   TRIG 143 07/28/2016 1600   TRIG 156 (H) 06/06/2013 1147   HDL 47 07/28/2016 1600   HDL 50 06/06/2013 1147   CHOLHDL 3.4 06/29/2016 0822   CHOLHDL 6.1 10/07/2008 0500   VLDL 26 10/07/2008 0500   LDLCALC  57 07/28/2016 1600   LDLCALC 50 06/06/2013 1147   Hepatic Function Panel     Component Value Date/Time   PROT 6.8 11/01/2016 1307   ALBUMIN 4.4 11/01/2016 1307   AST 18 11/01/2016 1307   ALT 21 11/01/2016 1307   ALKPHOS 65 11/01/2016 1307   BILITOT 0.3 11/01/2016 1307     ASSESSMENT AND PLAN: Vitamin D deficiency  Prediabetes - Plan: metFORMIN (GLUCOPHAGE) 500 MG tablet  Other depression - Plan: buPROPion (WELLBUTRIN SR) 150 MG 12 hr tablet  At risk for diabetes mellitus  Class 1 obesity without serious comorbidity with body mass index (BMI) of 34.0 to 34.9 in adult, unspecified obesity type  PLAN:  Vitamin D Deficiency Darolyn was informed that low vitamin D levels contributes to fatigue and are associated with obesity, breast, and colon cancer. She agrees to continue to take prescription Vit D @50 ,000 IU every week and will follow up for routine testing of vitamin D, at least 2-3 times per year. She was informed of the risk of over-replacement of vitamin D and agrees to not increase her dose unless he discusses this with [place] first.  Pre-Diabetes Cashe will continue to work on weight loss, exercise, and decreasing simple carbohydrates in her diet to help decrease the risk of diabetes. We dicussed metformin including benefits and risks. She was informed that eating too many simple carbohydrates or too many calories at one sitting increases the likelihood of GI side effects. Laurissa agrees to continue metformin for now and a prescription was written today for 1 month refill. Jordi agreed to follow up with [place] as directed to monitor her progress.  Diabetes risk counselling Kolette was given extended (at least 15 minutes) diabetes prevention counseling today. She is 56 y.o. female and has risk factors for diabetes including obesity. We discussed intensive lifestyle modifications today with an emphasis on weight loss as well as increasing exercise and decreasing simple carbohydrates in  her diet.  Depression with Emotional Eating Behaviors We discussed behavior modification techniques today to help Bertie deal with her emotional eating and depression. She has agreed to continue to take Wellbutrin SR 150 mg qd, we will refill for 1 month and agreed to follow up as directed.  Obesity Jailene is currently in the action stage of change. As such, her goal is to continue with weight loss efforts She has agreed to keep a food journal with 350 to 550 calories and 30+ grams of protein at supper daily and follow the Category 3 plan Rianne has been instructed to work up to a goal of 150 minutes of combined cardio and strengthening exercise per week for weight loss and overall health benefits. We discussed the following Behavioral Modification Stratagies today: increasing lean protein intake, increasing lower sugar fruits, increasing fiber rich foods and decrease eating out  Rennie has agreed to follow up with our clinic in 2 weeks. She was informed of the importance of frequent follow up visits to maximize her success with intensive lifestyle modifications for her multiple health conditions.  I,  Doreene Nest, am acting as scribe for Dennard Nip, MD  I have reviewed the above documentation for accuracy and completeness, and I agree with the above. -Dennard Nip, MD

## 2016-11-17 ENCOUNTER — Other Ambulatory Visit: Payer: Self-pay | Admitting: Family

## 2016-11-17 ENCOUNTER — Ambulatory Visit (INDEPENDENT_AMBULATORY_CARE_PROVIDER_SITE_OTHER): Payer: 59 | Admitting: Family Medicine

## 2016-11-17 ENCOUNTER — Other Ambulatory Visit: Payer: Self-pay | Admitting: *Deleted

## 2016-11-17 VITALS — BP 119/74 | HR 69 | Temp 98.0°F | Ht 63.0 in | Wt 194.0 lb

## 2016-11-17 DIAGNOSIS — E559 Vitamin D deficiency, unspecified: Secondary | ICD-10-CM

## 2016-11-17 DIAGNOSIS — E669 Obesity, unspecified: Secondary | ICD-10-CM | POA: Diagnosis not present

## 2016-11-17 DIAGNOSIS — Z6834 Body mass index (BMI) 34.0-34.9, adult: Secondary | ICD-10-CM | POA: Diagnosis not present

## 2016-11-17 MED ORDER — CIPROFLOXACIN HCL 500 MG PO TABS: 500.0000 mg | ORAL_TABLET | Freq: Two times a day (BID) | ORAL | 0 refills | Status: DC

## 2016-11-17 MED ORDER — METOPROLOL TARTRATE 25 MG PO TABS: 25.0000 mg | ORAL_TABLET | Freq: Two times a day (BID) | ORAL | 1 refills | Status: DC

## 2016-11-17 MED ORDER — METRONIDAZOLE 500 MG PO TABS: 500.0000 mg | ORAL_TABLET | Freq: Three times a day (TID) | ORAL | 0 refills | Status: DC

## 2016-11-17 MED FILL — CIPROFLOXACIN HCL 500 MG TA: 500 | 10 days supply | Qty: 20 | Fill #0

## 2016-11-17 MED FILL — metroNIDAZOLE 500 MG TABS: 500 | 7 days supply | Qty: 21 | Fill #0

## 2016-11-18 DIAGNOSIS — G4733 Obstructive sleep apnea (adult) (pediatric): Secondary | ICD-10-CM | POA: Diagnosis not present

## 2016-11-18 NOTE — Progress Notes (Signed)
Office: [phone]  /  Fax: [phone]   HPI:   Chief Complaint: OBESITY Heather Lam is here to discuss her progress with her obesity treatment plan. She is following her eating plan approximately 65 % of the time and states she is walking 45 minutes 2 times per week. Heather Lam continues to do well with weight loss but has had increased social eating and increased eating out. She tries to make better choices but is still eating approximately 1,000 calories each meal Her weight is 194 lb (88 kg) today and has had a weight loss of 2 pounds over a period of 2 weeks since her last visit. She has lost 21 lbs since starting treatment with [place].  Vitamin D deficiency Heather Lam has a diagnosis of vitamin D deficiency. She is currently taking  OTC vit D and notes fatigue is improving. She denies nausea, vomiting or muscle weakness.  Wt Readings from Last 500 Encounters:  11/17/16 194 lb (88 kg)  11/03/16 196 lb (88.9 kg)  10/26/16 201 lb (91.2 kg)  10/19/16 201 lb (91.2 kg)  09/28/16 204 lb (92.5 kg)  09/14/16 203 lb (92.1 kg)  08/31/16 204 lb (92.5 kg)  08/16/16 206 lb (93.4 kg)  08/13/16 211 lb (95.7 kg)  07/28/16 215 lb (97.5 kg)  07/06/16 220 lb 12.8 oz (100.2 kg)  06/03/16 210 lb (95.3 kg)  05/24/16 210 lb (95.3 kg)  05/05/16 210 lb (95.3 kg)  02/26/16 207 lb (93.9 kg)  02/25/16 207 lb (93.9 kg)  09/18/15 196 lb 3.2 oz (89 kg)  07/03/15 189 lb (85.7 kg)  07/01/15 189 lb (85.7 kg)  05/22/15 189 lb 1.9 oz (85.8 kg)  04/30/15 194 lb (88 kg)  03/24/15 199 lb 6.4 oz (90.4 kg)  06/20/13 215 lb (97.5 kg)  06/06/13 212 lb (96.2 kg)  05/26/12 221 lb (100.2 kg)  10/26/11 217 lb (98.4 kg)  10/13/11 217 lb (98.4 kg)  09/13/11 219 lb (99.3 kg)     ALLERGIES: No Known Allergies  MEDICATIONS: Current Outpatient Prescriptions on File Prior to Visit  Medication Sig Dispense Refill  . ALPRAZolam (XANAX) 0.25 MG tablet Take 0.25 mg by mouth as needed.     [name] aspirin 325 MG tablet Take 325 mg  by mouth daily.    [name] b complex vitamins tablet Take 1 tablet by mouth daily.    [name] buPROPion (WELLBUTRIN SR) 150 MG 12 hr tablet Take 1 tablet (150 mg total) by mouth every morning. 30 tablet 0  . Cholecalciferol 2000 units CAPS Take 1 capsule (2,000 Units total) by mouth daily. 30 each   . clopidogrel (PLAVIX) 75 MG tablet Take 1 tablet (75 mg total) by mouth daily. 90 tablet 1  . Coenzyme Q10 (CO Q10) 100 MG CAPS Take 1 capsule by mouth daily.    . DULoxetine (CYMBALTA) 30 MG capsule Take 2 capsules (60 mg total) by mouth daily. 180 capsule 3  . losartan-hydrochlorothiazide (HYZAAR) 100-25 MG tablet Take 1 tablet by mouth daily. 90 tablet 1  . metFORMIN (GLUCOPHAGE) 500 MG tablet Take 1 tablet (500 mg total) by mouth daily with breakfast. 30 tablet 0  . nitroGLYCERIN (NITROSTAT) 0.4 MG SL tablet 1 TABLET UNDER TONGUE EVERY 5 MINUTES UP TO 3 TIMES FOR CHEST PAIN THEN CALL DR IF NO RELIEF 25 tablet 3  . Omega-3 Fatty Acids (FISH OIL) 1000 MG CAPS 1 capsule once. Take 1  Tabs qd    . rosuvastatin (CRESTOR) 10 MG tablet Take 1 tablet (10  mg total) by mouth every other day. 90 tablet 1  . traZODone (DESYREL) 100 MG tablet Take 0.5-1 tablets (50-100 mg total) by mouth at bedtime. 90 tablet 3  . azithromycin (ZITHROMAX) 250 MG tablet Take 2 the first day and then one each day after. (Patient not taking: Reported on 11/03/2016) 6 tablet 0  . benzonatate (TESSALON) 200 MG capsule Take 1 capsule (200 mg total) by mouth 2 (two) times daily as needed for cough. (Patient not taking: Reported on 11/03/2016) 60 capsule 1  . HYDROcodone-homatropine (HYCODAN) 5-1.5 MG/5ML syrup Take 5 mLs by mouth every 6 (six) hours as needed for cough. (Patient not taking: Reported on 11/03/2016) 120 mL 0   Current Facility-Administered Medications on File Prior to Visit  Medication Dose Route Frequency Provider Last Rate Last Dose  . methylPREDNISolone acetate (DEPO-MEDROL) injection 80 mg  80 mg Intramuscular Once Worthy Rancher, MD        PAST MEDICAL HISTORY: Past Medical History:  Diagnosis Date  . Anxiety   . Back pain   . CAD (coronary artery disease)   . Chest pain   . Complication of anesthesia   . Constipation   . Diverticulitis   . Diverticulosis   . Heart attack ([place]) 2010  . Hyperlipemia   . Hypertension   . Joint pain   . PONV (postoperative nausea and vomiting)   . Skin cancer   . Sleep apnea    wears CPAP nightly  . Vitamin D deficiency     PAST SURGICAL HISTORY: Past Surgical History:  Procedure Laterality Date  . APPENDECTOMY    . BACK SURGERY    . CARPAL TUNNEL RELEASE Right   . CARPAL TUNNEL RELEASE Left 08/13/2016   Procedure: LEFT CARPAL TUNNEL RELEASE;  Surgeon: Roseanne Kaufman, MD;  Location: [place];  Service: Orthopedics;  Laterality: Left;  . CORONARY STENT PLACEMENT    . ENDOMETRIAL ABLATION  2005  . MOHS SURGERY    . TUBAL LIGATION  1991    SOCIAL HISTORY: Social History  Substance Use Topics  . Smoking status: Former Research scientist (life sciences)  . Smokeless tobacco: Never Used  . Alcohol use Yes     Comment: Rare     FAMILY HISTORY: Family History  Problem Relation Age of Onset  . Hyperlipidemia Mother   . Hypertension Mother   . Cancer Father     skin  . Hyperlipidemia Father   . Dementia Father   . Arthritis Father   . Hypertension Father   . Sleep apnea Father   . Other Sister     Myelofibrosis  . Arthritis Sister   . Hyperlipidemia Sister   . Arthritis Sister   . Hyperlipidemia Sister   . Diabetes Paternal Grandfather   . Colon cancer Neg Hx     ROS: Review of Systems  Constitutional: Positive for malaise/fatigue and weight loss.  Gastrointestinal: Negative for nausea and vomiting.  Musculoskeletal:       Negative muscle weakness    PHYSICAL EXAM: Blood pressure 119/74, pulse 69, temperature 98 F (36.7 C), temperature source Oral, height 5\' 3"  (1.6 m), weight 194 lb (88 kg), last menstrual period 12/31/2013, SpO2 96 %. Body  mass index is 34.37 kg/m. Physical Exam  Constitutional: She is oriented to person, place, and time. She appears well-developed and well-nourished.  Cardiovascular: Normal rate.   Pulmonary/Chest: Effort normal.  Musculoskeletal: Normal range of motion.  Neurological: She is oriented to person, place, and time.  Skin:  Skin is warm and dry.  Psychiatric: She has a normal mood and affect. Her behavior is normal.  Vitals reviewed.   RECENT LABS AND TESTS: BMET    Component Value Date/Time   NA 142 11/01/2016 1307   K 4.3 11/01/2016 1307   CL 100 11/01/2016 1307   CO2 26 11/01/2016 1307   GLUCOSE 90 11/01/2016 1307   GLUCOSE 104 (H) 10/06/2008 0545   BUN 14 11/01/2016 1307   CREATININE 0.69 11/01/2016 1307   CALCIUM 9.6 11/01/2016 1307   GFRNONAA 98 11/01/2016 1307   GFRAA 113 11/01/2016 1307   Lab Results  Component Value Date   HGBA1C 5.8 (H) 11/01/2016   HGBA1C 6.0 (H) 07/28/2016   HGBA1C  10/07/2008    5.6 (NOTE)   The ADA recommends the following therapeutic goal for glycemic   control related to Hgb A1C measurement:   Goal of Therapy:   < 7.0% Hgb A1C   Reference: American Diabetes Association: Clinical Practice   Recommendations 2008, Diabetes Care,  2008, 31:(Suppl 1).   Lab Results  Component Value Date   INSULIN 19.6 11/01/2016   INSULIN 29.2 (H) 07/28/2016   CBC    Component Value Date/Time   WBC 10.2 07/28/2016 1600   WBC 10.9 (H) 10/06/2008 0545   RBC 4.77 07/28/2016 1600   RBC 4.25 10/06/2008 0545   HGB 12.9 10/06/2008 0545   HCT 39.3 07/28/2016 1600   PLT 226 03/24/2015 1026   MCV 82 07/28/2016 1600   MCH 27.3 07/28/2016 1600   MCHC 33.1 07/28/2016 1600   MCHC 34.8 10/06/2008 0545   RDW 14.5 07/28/2016 1600   LYMPHSABS 2.9 07/28/2016 1600   MONOABS 0.7 10/05/2008 1425   EOSABS 0.2 07/28/2016 1600   BASOSABS 0.1 07/28/2016 1600   Iron/TIBC/Ferritin/ %Sat No results found for: IRON, TIBC, FERRITIN, IRONPCTSAT Lipid Panel     Component Value  Date/Time   CHOL 133 07/28/2016 1600   TRIG 143 07/28/2016 1600   TRIG 156 (H) 06/06/2013 1147   HDL 47 07/28/2016 1600   HDL 50 06/06/2013 1147   CHOLHDL 3.4 06/29/2016 0822   CHOLHDL 6.1 10/07/2008 0500   VLDL 26 10/07/2008 0500   LDLCALC 57 07/28/2016 1600   LDLCALC 50 06/06/2013 1147   Hepatic Function Panel     Component Value Date/Time   PROT 6.8 11/01/2016 1307   ALBUMIN 4.4 11/01/2016 1307   AST 18 11/01/2016 1307   ALT 21 11/01/2016 1307   ALKPHOS 65 11/01/2016 1307   BILITOT 0.3 11/01/2016 1307     ASSESSMENT AND PLAN: Vitamin D deficiency  Class 1 obesity without serious comorbidity with body mass index (BMI) of 34.0 to 34.9 in adult, unspecified obesity type  PLAN:  Vitamin D Deficiency Heather Lam was informed that low vitamin D levels contributes to fatigue and are associated with obesity, breast, and colon cancer. She agrees to continue to take OTC vitamin D 2,000 IU daily and we will re-check labs in 1 month and will follow up for routine testing of vitamin D, at least 2-3 times per year. She was informed of the risk of over-replacement of vitamin D and agrees to not increase her dose unless he discusses this with [place] first. Heather Lam agrees to follow up with our clinic in 2 weeks.  We spent > than 50% of the 15 minute visit on the counseling as documented in the note.  Obesity Heather Lam is currently in the action stage of change. As such, her goal is to  continue with weight loss efforts She has agreed to follow the Category 3 plan Heather Lam has been instructed to work up to a goal of 150 minutes of combined cardio and strengthening exercise per week or continue walking for 45 minutes 2 times per week for weight loss and overall health benefits. We discussed the following Behavioral Modification Stratagies today: increasing lean protein intake, increasing vegetables and emotional eating strategies  Avory has agreed to follow up with our clinic in 2 weeks. She was informed  of the importance of frequent follow up visits to maximize her success with intensive lifestyle modifications for her multiple health conditions.  Heather Lam, Doreene Nest, am acting as scribe for Dennard Nip, MD  Heather Lam have reviewed the above documentation for accuracy and completeness, and Heather Lam agree with the above. -Dennard Nip, MD

## 2016-11-19 ENCOUNTER — Emergency Department (HOSPITAL_COMMUNITY): Payer: 59

## 2016-11-19 ENCOUNTER — Encounter (HOSPITAL_COMMUNITY): Payer: Self-pay | Admitting: Emergency Medicine

## 2016-11-19 ENCOUNTER — Emergency Department (HOSPITAL_COMMUNITY)
Admission: EM | Admit: 2016-11-19 | Discharge: 2016-11-20 | Disposition: A | Discharge status: Home / Self Care | Payer: 59 | Attending: Emergency Medicine | Admitting: Emergency Medicine

## 2016-11-19 DIAGNOSIS — I10 Essential (primary) hypertension: Secondary | ICD-10-CM | POA: Insufficient documentation

## 2016-11-19 DIAGNOSIS — Z85828 Personal history of other malignant neoplasm of skin: Secondary | ICD-10-CM | POA: Insufficient documentation

## 2016-11-19 DIAGNOSIS — I2510 Atherosclerotic heart disease of native coronary artery without angina pectoris: Secondary | ICD-10-CM | POA: Diagnosis not present

## 2016-11-19 DIAGNOSIS — Z7982 Long term (current) use of aspirin: Secondary | ICD-10-CM | POA: Diagnosis not present

## 2016-11-19 DIAGNOSIS — Z87891 Personal history of nicotine dependence: Secondary | ICD-10-CM | POA: Diagnosis not present

## 2016-11-19 DIAGNOSIS — R1084 Generalized abdominal pain: Secondary | ICD-10-CM | POA: Diagnosis present

## 2016-11-19 DIAGNOSIS — K529 Noninfective gastroenteritis and colitis, unspecified: Secondary | ICD-10-CM | POA: Insufficient documentation

## 2016-11-19 DIAGNOSIS — R109 Unspecified abdominal pain: Secondary | ICD-10-CM | POA: Diagnosis not present

## 2016-11-19 LAB — COMPREHENSIVE METABOLIC PANEL
ALT: 22 U/L (ref 14–54)
AST: 27 U/L (ref 15–41)
Albumin: 4.2 g/dL (ref 3.5–5.0)
Alkaline Phosphatase: 63 U/L (ref 38–126)
Anion gap: 14 (ref 5–15)
BUN: 22 mg/dL — ABNORMAL HIGH (ref 6–20)
CO2: 25 mmol/L (ref 22–32)
Calcium: 9.8 mg/dL (ref 8.9–10.3)
Chloride: 98 mmol/L — ABNORMAL LOW (ref 101–111)
Creatinine, Ser: 0.78 mg/dL (ref 0.44–1.00)
GFR calc Af Amer: >60 mL/min (ref >60)
GFR calc non Af Amer: >60 mL/min (ref >60)
Glucose, Bld: 138 mg/dL — ABNORMAL HIGH (ref 65–99)
Potassium: 3.4 mmol/L — ABNORMAL LOW (ref 3.5–5.1)
Sodium: 137 mmol/L (ref 135–145)
Total Bilirubin: 0.8 mg/dL (ref 0.3–1.2)
Total Protein: 7.9 g/dL (ref 6.5–8.1)

## 2016-11-19 LAB — CBC
HCT: 44.8 % (ref 36.0–46.0)
Hemoglobin: 14.7 g/dL (ref 12.0–15.0)
MCH: 27.6 pg (ref 26.0–34.0)
MCHC: 32.8 g/dL (ref 30.0–36.0)
MCV: 84.2 fL (ref 78.0–100.0)
Platelets: 265 10*3/uL (ref 150–400)
RBC: 5.32 MIL/uL — ABNORMAL HIGH (ref 3.87–5.11)
RDW: 13.6 % (ref 11.5–15.5)
WBC: 14.3 10*3/uL — ABNORMAL HIGH (ref 4.0–10.5)

## 2016-11-19 LAB — LIPASE, BLOOD: Lipase: 35 U/L (ref 11–51)

## 2016-11-19 MED ORDER — ONDANSETRON HCL 4 MG/2ML IJ SOLN
4.0000 mg | Freq: Once | INTRAMUSCULAR | Status: AC
  Administered 2016-11-19: 4 mg via INTRAVENOUS
  Filled 2016-11-19: qty 2

## 2016-11-19 MED ORDER — HYDROMORPHONE HCL 1 MG/ML IJ SOLN
1.0000 mg | Freq: Once | INTRAMUSCULAR | Status: AC
  Administered 2016-11-19: 1 mg via INTRAVENOUS
  Filled 2016-11-19: qty 1

## 2016-11-19 MED ORDER — SODIUM CHLORIDE 0.9 % IV BOLUS (SEPSIS)
1000.0000 mL | Freq: Once | INTRAVENOUS | Status: AC
  Administered 2016-11-19: 1000 mL via INTRAVENOUS

## 2016-11-19 MED ORDER — IOPAMIDOL (ISOVUE-300) INJECTION 61%
INTRAVENOUS | Status: AC
  Administered 2016-11-19: 100 mL
  Filled 2016-11-19: qty 100

## 2016-11-19 NOTE — ED Provider Notes (Signed)
[facility] DEPT Provider Note   CSN: [id number] Arrival date & time: 11/19/16  2031     History   Chief Complaint Chief Complaint  Patient presents with  . Abdominal Pain    HPI Heather Lam is a 56 y.o. female.  Presents for evaluation of abdominal pain.  She reports vague diffuse abdominal pain, for several days, then suddenly tonight she began to have right lower quadrant and right low back pain.  This exacerbation occurred about 1 hour ago.  Since then she has had a couple episodes of vomiting and diarrhea.  She denies fever, chest pain, cough, weakness or dizziness.  Yesterday, her PCP, started her on Cipro and Flagyl to treat suspected diverticulitis.  She is taking her usual medications, without relief.  She is on Plavix for cardiac reasons.  There are no other known modifying factors.  HPI  Past Medical History:  Diagnosis Date  . Anxiety   . Back pain   . CAD (coronary artery disease)   . Chest pain   . Complication of anesthesia   . Constipation   . Diverticulitis   . Diverticulosis   . Heart attack ([place]) 2010  . Hyperlipemia   . Hypertension   . Joint pain   . PONV (postoperative nausea and vomiting)   . Skin cancer   . Sleep apnea    wears CPAP nightly  . Vitamin D deficiency     Patient Active Problem List   Diagnosis Date Noted  . Class 1 obesity without serious comorbidity with body mass index (BMI) of 34.0 to 34.9 in adult 11/03/2016  . Depression 09/28/2016  . Prediabetes 09/28/2016  . Class 2 obesity without serious comorbidity with body mass index (BMI) of 36.0 to 36.9 in adult 08/16/2016  . Vitamin D deficiency 08/16/2016  . Recurrent major depressive disorder, in partial remission ([place]) 05/25/2016  . Adjustment disorder 05/25/2016  . Myalgia 05/25/2016  . Carpal tunnel syndrome 02/26/2016  . Obesity (BMI 30-39.9) 02/25/2016  . Insomnia 07/01/2015  . Essential hypertension 05/21/2015  . Generalized anxiety disorder 06/06/2013  . CAD  S/P percutaneous coronary angioplasty 09/13/2011  . HTN (hypertension) 09/13/2011  . Antiplatelet or antithrombotic long-term use 09/13/2011  . Diverticulitis large intestine 09/13/2011  . OSA on CPAP 09/13/2011  . Hyperlipidemia 09/13/2011    Past Surgical History:  Procedure Laterality Date  . APPENDECTOMY    . BACK SURGERY    . CARPAL TUNNEL RELEASE Right   . CARPAL TUNNEL RELEASE Left 08/13/2016   Procedure: LEFT CARPAL TUNNEL RELEASE;  Surgeon: Roseanne Kaufman, MD;  Location: [facility];  Service: Orthopedics;  Laterality: Left;  . CORONARY STENT PLACEMENT    . ENDOMETRIAL ABLATION  2005  . MOHS SURGERY    . TUBAL LIGATION  1991    OB History    Gravida Para Term Preterm AB Living   2 2       2    SAB TAB Ectopic Multiple Live Births                   Home Medications    Prior to Admission medications   Medication Sig Start Date End Date Taking? Authorizing Provider  ALPRAZolam (XANAX) 0.25 MG tablet Take 0.25 mg by mouth as needed.  06/06/13   Mary-Margaret Hassell Done, FNP  aspirin 325 MG tablet Take 325 mg by mouth daily.    Historical Provider, MD  azithromycin (ZITHROMAX) 250 MG tablet Take 2 the first day and then  one each day after. Patient not taking: Reported on 11/03/2016 10/22/16   Fransisca Kaufmann Dettinger, MD  b complex vitamins tablet Take 1 tablet by mouth daily.    Historical Provider, MD  benzonatate (TESSALON) 200 MG capsule Take 1 capsule (200 mg total) by mouth 2 (two) times daily as needed for cough. Patient not taking: Reported on 11/03/2016 10/28/16   Fransisca Kaufmann Dettinger, MD  buPROPion [facility] SR) 150 MG 12 hr tablet Take 1 tablet (150 mg total) by mouth every morning. 11/03/16   Starlyn Skeans, MD  Cholecalciferol 2000 units CAPS Take 1 capsule (2,000 Units total) by mouth daily. 08/17/16   Caren Drucie Opitz, MD  ciprofloxacin (CIPRO) 500 MG tablet Take 1 tablet (500 mg total) by mouth 2 (two) times daily. 11/17/16   Sharion Balloon, FNP  clopidogrel  (PLAVIX) 75 MG tablet Take 1 tablet (75 mg total) by mouth daily. 06/07/16   Fransisca Kaufmann Dettinger, MD  Coenzyme Q10 (CO Q10) 100 MG CAPS Take 1 capsule by mouth daily.    Historical Provider, MD  DULoxetine (CYMBALTA) 30 MG capsule Take 2 capsules (60 mg total) by mouth daily. 06/23/16   Terald Sleeper, PA-C  HYDROcodone-homatropine (HYCODAN) 5-1.5 MG/5ML syrup Take 5 mLs by mouth every 6 (six) hours as needed for cough. Patient not taking: Reported on 11/03/2016 10/22/16   Fransisca Kaufmann Dettinger, MD  losartan-hydrochlorothiazide (HYZAAR) 100-25 MG tablet Take 1 tablet by mouth daily. 06/07/16   Fransisca Kaufmann Dettinger, MD  metFORMIN (GLUCOPHAGE) 500 MG tablet Take 1 tablet (500 mg total) by mouth daily with breakfast. 11/03/16   Caren Drucie Opitz, MD  metoprolol tartrate (LOPRESSOR) 25 MG tablet Take 1 tablet (25 mg total) by mouth 2 (two) times daily. 11/17/16   Terald Sleeper, PA-C  metroNIDAZOLE (FLAGYL) 500 MG tablet Take 1 tablet (500 mg total) by mouth 3 (three) times daily. 11/17/16   Sharion Balloon, FNP  nitroGLYCERIN (NITROSTAT) 0.4 MG SL tablet 1 TABLET UNDER TONGUE EVERY 5 MINUTES UP TO 3 TIMES FOR CHEST PAIN THEN CALL DR IF NO RELIEF 07/06/16   Isaiah Serge, NP  Omega-3 Fatty Acids (FISH OIL) 1000 MG CAPS 1 capsule once. Take 1  Tabs qd    Historical Provider, MD  oxyCODONE-acetaminophen (PERCOCET) 5-325 MG tablet Take 1 tablet by mouth every 4 (four) hours as needed for severe pain. 11/20/16   Daleen Bo, MD  rosuvastatin (CRESTOR) 10 MG tablet Take 1 tablet (10 mg total) by mouth every other day. 06/07/16   Fransisca Kaufmann Dettinger, MD  traZODone (DESYREL) 100 MG tablet Take 0.5-1 tablets (50-100 mg total) by mouth at bedtime. 09/21/16   Timmothy Euler, MD    Family History Family History  Problem Relation Age of Onset  . Hyperlipidemia Mother   . Hypertension Mother   . Cancer Father     skin  . Hyperlipidemia Father   . Dementia Father   . Arthritis Father   . Hypertension Father   . Sleep apnea  Father   . Other Sister     Myelofibrosis  . Arthritis Sister   . Hyperlipidemia Sister   . Arthritis Sister   . Hyperlipidemia Sister   . Diabetes Paternal Grandfather   . Colon cancer Neg Hx     Social History Social History  Substance Use Topics  . Smoking status: Former Research scientist (life sciences)  . Smokeless tobacco: Never Used  . Alcohol use Yes     Comment: Rare  Allergies   Patient has no known allergies.   Review of Systems Review of Systems  All other systems reviewed and are negative.    Physical Exam Updated Vital Signs BP (!) 151/71 (BP Location: Left Arm)   Pulse 93   Resp (!) 22   LMP 12/31/2013   SpO2 97%   Physical Exam  Constitutional: She is oriented to person, place, and time. She appears well-developed and well-nourished. She appears distressed (She is uncomfortable).  HENT:  Head: Normocephalic and atraumatic.  Eyes: Conjunctivae and EOM are normal. Pupils are equal, round, and reactive to light.  Neck: Normal range of motion and phonation normal. Neck supple.  Cardiovascular: Normal rate and regular rhythm.   Pulmonary/Chest: Effort normal and breath sounds normal. She exhibits no tenderness.  Abdominal: Soft. She exhibits no distension. There is no tenderness. There is guarding (Generalized).  Musculoskeletal: Normal range of motion.  Neurological: She is alert and oriented to person, place, and time. She exhibits normal muscle tone.  Skin: Skin is warm and dry.  Psychiatric: She has a normal mood and affect. Her behavior is normal. Judgment and thought content normal.  Nursing note and vitals reviewed.    ED Treatments / Results  Labs (all labs ordered are listed, but only abnormal results are displayed) Labs Reviewed  COMPREHENSIVE METABOLIC PANEL - Abnormal; Notable for the following:       Result Value   Potassium 3.4 (*)    Chloride 98 (*)    Glucose, Bld 138 (*)    BUN 22 (*)    All other components within normal limits  CBC - Abnormal;  Notable for the following:    WBC 14.3 (*)    RBC 5.32 (*)    All other components within normal limits  LIPASE, BLOOD  URINALYSIS, ROUTINE W REFLEX MICROSCOPIC    EKG  EKG Interpretation None       Radiology Ct Abdomen Pelvis W Contrast  Result Date: 11/19/2016 CLINICAL DATA:  Diffuse abdominal pain EXAM: CT ABDOMEN AND PELVIS WITH CONTRAST TECHNIQUE: Multidetector CT imaging of the abdomen and pelvis was performed using the standard protocol following bolus administration of intravenous contrast. CONTRAST:  145mL ISOVUE-300 IOPAMIDOL (ISOVUE-300) INJECTION 61% COMPARISON:  05/13/2015 FINDINGS: Lower chest: Lung bases demonstrate no acute infiltrate, consolidation, or effusion. Coronary artery calcification. Hepatobiliary: No focal liver abnormality is seen. No gallstones, gallbladder wall thickening, or biliary dilatation. Pancreas: Unremarkable. No pancreatic ductal dilatation or surrounding inflammatory changes. Spleen: Normal in size without focal abnormality. Adrenals/Urinary Tract: Adrenal glands are within normal limits. No hydronephrosis. 9 mm stone in the right renal pelvis, no appreciable hydronephrosis. No ureteral stones. 2.9 cm peripherally calcified cyst in the mid to lower left kidney. Subcentimeter cortical hypodensity on the left, too small to further characterize. Interim increase in size of a hypodense lesion in the mid right kidney now measuring 2.5 cm and with intermediate density values. The urinary bladder is unremarkable Stomach/Bowel: The stomach is nonenlarged. There is no dilated small bowel. Nonvisualization of the appendix. Diffuse fluid-filled colon. Diverticular disease of the sigmoid colon. Questionable wall thickening of the sigmoid colon Vascular/Lymphatic: Aortic atherosclerosis. No enlarged abdominal or pelvic lymph nodes. Reproductive: Calcified masses in the uterus compatible with fibroids. No adnexal masses. Other: No free air or free fluid. Musculoskeletal:  Degenerative changes at L5-S1. No acute osseous abnormality. IMPRESSION: 1. Diffuse fluid-filled colon. Collapsed appearance of colon versus mild colitis of the sigmoid colon. 2. Increased size of a hypodense lesion in the  mid right kidney, now measuring 2.5 cm and with intermediate density values, could represent complicated cyst but given interval growth, consider correlation with nonemergent MRI 3. 9 mm stone in the right renal pelvis 4. Calcified uterine fibroids Electronically Signed   By: Donavan Foil M.D.   On: 11/19/2016 23:56    Procedures Procedures (including critical care time)  Medications Ordered in ED Medications  HYDROmorphone (DILAUDID) injection 1 mg (1 mg Intravenous Given 11/19/16 2119)  ondansetron (ZOFRAN) injection 4 mg (4 mg Intravenous Given 11/19/16 2119)  sodium chloride 0.9 % bolus 1,000 mL (1,000 mLs Intravenous New Bag/Given 11/19/16 2208)  iopamidol (ISOVUE-300) 61 % injection (100 mLs  Contrast Given 11/19/16 2307)     Initial Impression / Assessment and Plan / ED Course  I have reviewed the triage vital signs and the nursing notes.  Pertinent labs & imaging results that were available during my care of the patient were reviewed by me and considered in my medical decision making (see chart for details).     Medications  HYDROmorphone (DILAUDID) injection 1 mg (1 mg Intravenous Given 11/19/16 2119)  ondansetron [facility]) injection 4 mg (4 mg Intravenous Given 11/19/16 2119)  sodium chloride 0.9 % bolus 1,000 mL (1,000 mLs Intravenous New Bag/Given 11/19/16 2208)  iopamidol (ISOVUE-300) 61 % injection (100 mLs  Contrast Given 11/19/16 2307)    Patient Vitals for the past 24 hrs:  BP Pulse Resp SpO2  11/20/16 0001 (!) 151/71 93 (!) 22 97 %  11/19/16 2245 139/80 82 19 99 %  11/19/16 2230 134/69 81 18 98 %  11/19/16 2215 131/68 74 16 96 %  11/19/16 2042 133/63 82 (!) 26 99 %    11:55 PM Reevaluation with update and discussion. After initial assessment and  treatment, an updated evaluation reveals she is much more comfortable at this time.  Findings discussed with the patient, updated on plain. Cydni Reddoch L    Final Clinical Impressions(s) / ED Diagnoses   Final diagnoses:  Colitis    Abdominal pain with history of diverticulitis.  No prior surgical interventions for same.  Patient improved with initial treatment.  Mild colitis on imaging.  Increased right renal mass, nonspecific.  Patient informed of that finding and the need for further imaging with MRI.  Right kidney stone present, without hydronephrosis.  Nursing Notes Reviewed/ Care Coordinated Applicable Imaging Reviewed Interpretation of Laboratory Data incorporated into ED treatment  The patient appears reasonably screened and/or stabilized for discharge and I doubt any other medical condition or other [facility] requiring further screening, evaluation, or treatment in the ED at this time prior to discharge.  Plan: Home Medications-continue taking Flagyl and Cipro which was started yesterday; Home Treatments-gradually advance diet; return here if the recommended treatment, does not improve the symptoms; Recommended follow up-PCP follow-up 1 week.  Have them schedule MRI to look at right renal cyst.     New Prescriptions New Prescriptions   OXYCODONE-ACETAMINOPHEN (PERCOCET) 5-325 MG TABLET    Take 1 tablet by mouth every 4 (four) hours as needed for severe pain.     Daleen Bo, MD 11/20/16 [phone]

## 2016-11-19 NOTE — ED Notes (Signed)
Patient transported to CT 

## 2016-11-19 NOTE — ED Triage Notes (Signed)
Patient arrives from home with complaint of diffuse abdominal pain. Onset of abdominal pain was several days ago. Denies fever. Today about "1hr ago" the pain became terrible. Patient appears very uncomfortable in triage. Panting. States "feeling like i'm going to pass out".

## 2016-11-19 NOTE — ED Triage Notes (Signed)
States diverticulitis. States she thought the pain was the beginning of a flare up. Started on Flagyl yesterday.

## 2016-11-20 DIAGNOSIS — I10 Essential (primary) hypertension: Secondary | ICD-10-CM | POA: Diagnosis not present

## 2016-11-20 DIAGNOSIS — Z85828 Personal history of other malignant neoplasm of skin: Secondary | ICD-10-CM | POA: Diagnosis not present

## 2016-11-20 DIAGNOSIS — Z7982 Long term (current) use of aspirin: Secondary | ICD-10-CM | POA: Diagnosis not present

## 2016-11-20 DIAGNOSIS — Z87891 Personal history of nicotine dependence: Secondary | ICD-10-CM | POA: Diagnosis not present

## 2016-11-20 DIAGNOSIS — I2510 Atherosclerotic heart disease of native coronary artery without angina pectoris: Secondary | ICD-10-CM | POA: Diagnosis not present

## 2016-11-20 DIAGNOSIS — K529 Noninfective gastroenteritis and colitis, unspecified: Secondary | ICD-10-CM | POA: Diagnosis not present

## 2016-11-20 LAB — URINALYSIS, ROUTINE W REFLEX MICROSCOPIC
Bacteria, UA: NONE SEEN
Bilirubin Urine: NEGATIVE
Glucose, UA: NEGATIVE mg/dL
Ketones, ur: NEGATIVE mg/dL
Leukocytes, UA: NEGATIVE
Nitrite: NEGATIVE
Protein, ur: 30 mg/dL — AB
Specific Gravity, Urine: >1.046 — ABNORMAL HIGH (ref 1.005–1.030)
pH: 7 (ref 5.0–8.0)

## 2016-11-20 MED ORDER — OXYCODONE-ACETAMINOPHEN 5-325 MG PO TABS: 1.0000 | ORAL_TABLET | ORAL | 0 refills | Status: DC | PRN

## 2016-11-20 NOTE — Discharge Instructions (Signed)
Drink plenty of fluids.  Gradually advance your diet.  Have your doctor order an MRI to look at the right renal cyst, which appears to be enlarging.  There is a small stone in the right kidney, which does not appear to be causing problems tonight.

## 2016-11-24 ENCOUNTER — Other Ambulatory Visit: Payer: Self-pay

## 2016-11-24 MED ORDER — TAMSULOSIN HCL 0.4 MG PO CAPS: 0.8000 mg | ORAL_CAPSULE | Freq: Every day | ORAL | 0 refills | Status: DC

## 2016-11-24 MED FILL — BUPROPION SR 150 MG TABLET: 150 | 30 days supply | Qty: 30 | Fill #0

## 2016-11-24 MED FILL — METOPROLOL TARTRATE 25 MG T: 25 | 90 days supply | Qty: 180 | Fill #0

## 2016-11-25 MED ORDER — TAMSULOSIN HCL 0.4 MG PO CAPS
0.8000 mg | ORAL_CAPSULE | Freq: Every day | ORAL | 0 refills | Status: DC
Start: 2016-11-25 — End: 2016-12-07

## 2016-12-02 ENCOUNTER — Other Ambulatory Visit: Payer: Self-pay | Admitting: Physician Assistant

## 2016-12-02 ENCOUNTER — Other Ambulatory Visit: Payer: Self-pay | Admitting: *Deleted

## 2016-12-02 DIAGNOSIS — N289 Disorder of kidney and ureter, unspecified: Secondary | ICD-10-CM

## 2016-12-02 MED FILL — ROSUVASTATIN CALCIUM 10 MG: 10 | 90 days supply | Qty: 45 | Fill #2

## 2016-12-06 ENCOUNTER — Other Ambulatory Visit: Payer: Self-pay | Admitting: Family Medicine

## 2016-12-07 ENCOUNTER — Ambulatory Visit (HOSPITAL_COMMUNITY): Payer: 59

## 2016-12-07 ENCOUNTER — Ambulatory Visit (INDEPENDENT_AMBULATORY_CARE_PROVIDER_SITE_OTHER): Payer: 59 | Admitting: Family Medicine

## 2016-12-07 VITALS — BP 107/61 | HR 71 | Temp 97.7°F | Ht 63.0 in | Wt 192.0 lb

## 2016-12-07 DIAGNOSIS — F3289 Other specified depressive episodes: Secondary | ICD-10-CM | POA: Diagnosis not present

## 2016-12-07 DIAGNOSIS — E669 Obesity, unspecified: Secondary | ICD-10-CM

## 2016-12-07 DIAGNOSIS — Z9189 Other specified personal risk factors, not elsewhere classified: Secondary | ICD-10-CM

## 2016-12-07 DIAGNOSIS — Z6834 Body mass index (BMI) 34.0-34.9, adult: Secondary | ICD-10-CM | POA: Diagnosis not present

## 2016-12-07 MED FILL — CLOPIDOGREL 75 MG TABLET: 75 | 90 days supply | Qty: 90 | Fill #0

## 2016-12-07 MED FILL — LOSARTAN-HCTZ 100-25 MG TAB: 100-25 | 90 days supply | Qty: 90 | Fill #0

## 2016-12-08 ENCOUNTER — Other Ambulatory Visit: Payer: Self-pay | Admitting: *Deleted

## 2016-12-08 ENCOUNTER — Ambulatory Visit (HOSPITAL_COMMUNITY)
Admission: RE | Admit: 2016-12-08 | Discharge: 2016-12-08 | Disposition: A | Discharge status: Home / Self Care | Payer: 59 | Source: Ambulatory Visit | Attending: Physician Assistant | Admitting: Physician Assistant

## 2016-12-08 DIAGNOSIS — N281 Cyst of kidney, acquired: Secondary | ICD-10-CM | POA: Insufficient documentation

## 2016-12-08 DIAGNOSIS — F3289 Other specified depressive episodes: Secondary | ICD-10-CM

## 2016-12-08 DIAGNOSIS — N289 Disorder of kidney and ureter, unspecified: Secondary | ICD-10-CM | POA: Insufficient documentation

## 2016-12-08 DIAGNOSIS — R7303 Prediabetes: Secondary | ICD-10-CM

## 2016-12-08 MED ORDER — BUPROPION HCL ER (SR) 150 MG PO TB12: 150.0000 mg | ORAL_TABLET | Freq: Every day | ORAL | 0 refills | Status: DC

## 2016-12-08 MED ORDER — GADOBENATE DIMEGLUMINE 529 MG/ML IV SOLN
17.0000 mL | Freq: Once | INTRAVENOUS | Status: AC | PRN
  Administered 2016-12-08: 17 mL via INTRAVENOUS

## 2016-12-08 MED ORDER — METFORMIN HCL 500 MG PO TABS: 500.0000 mg | ORAL_TABLET | Freq: Every day | ORAL | 0 refills | Status: DC

## 2016-12-08 MED ORDER — BUPROPION HCL ER (SR) 150 MG PO TB12: 150.0000 mg | ORAL_TABLET | Freq: Every morning | ORAL | 0 refills | Status: DC

## 2016-12-08 NOTE — Progress Notes (Signed)
Office: [phone]  /  Fax: [phone]   HPI:   Chief Complaint: OBESITY Heather Lam is here to discuss her progress with her obesity treatment plan. She is on the  follow the Category 3 plan and is following her eating plan approximately 65 % of the time. She states she is walking 30 minutes 3 to 4 times per week. Heather Lam continues to do well with weight loss, has done better with decreasing emotional eating. She is doing well planning meals ahead of time and decreasing snacking. Her weight is 192 lb (87.1 kg) today and has had a weight loss of 2 pounds over a period of 3 weeks since her last visit. She has lost 23 lbs since starting treatment with [place].  Depression with emotional eating behaviors Heather Lam is struggling with emotional eating and using food for comfort to the extent that it is negatively impacting her health. She often snacks when she is not hungry. Heather Lam sometimes feels she is out of control and then feels guilty that she made poor food choices. Her mood is stable on medications but she has increased stress with father's declining health. Heather Lam wonders if the Lexapro is blunting her mood. She has been working on behavior modification techniques to help reduce her emotional eating and has been somewhat successful. She shows no sign of suicidal or homicidal ideations.  At risk for diabetes Heather Lam is at higher than average risk for developing diabetes due to her obesity. She currently denies polyuria or polydipsia.  Depression screen [facility] 2/9 07/28/2016 06/03/2016 05/24/2016 05/05/2016 02/26/2016  Decreased Interest 2 1 1  0 0  Down, Depressed, Hopeless 2 1 2  0 0  PHQ - 2 Score 4 2 3  0 0  Altered sleeping 1 0 0 - -  Tired, decreased energy 2 1 3  - -  Change in appetite 1 1 2  - -  Feeling bad or failure about yourself  0 0 0 - -  Trouble concentrating 0 1 1 - -  Moving slowly or fidgety/restless 0 0 0 - -  Suicidal thoughts 0 0 0 - -  PHQ-9 Score 8 5 9  - -  Difficult doing  work/chores - - Somewhat difficult - -     Wt Readings from Last 500 Encounters:  12/07/16 192 lb (87.1 kg)  11/17/16 194 lb (88 kg)  11/03/16 196 lb (88.9 kg)  10/26/16 201 lb (91.2 kg)  10/19/16 201 lb (91.2 kg)  09/28/16 204 lb (92.5 kg)  09/14/16 203 lb (92.1 kg)  08/31/16 204 lb (92.5 kg)  08/16/16 206 lb (93.4 kg)  08/13/16 211 lb (95.7 kg)  07/28/16 215 lb (97.5 kg)  07/06/16 220 lb 12.8 oz (100.2 kg)  06/03/16 210 lb (95.3 kg)  05/24/16 210 lb (95.3 kg)  05/05/16 210 lb (95.3 kg)  02/26/16 207 lb (93.9 kg)  02/25/16 207 lb (93.9 kg)  09/18/15 196 lb 3.2 oz (89 kg)  07/03/15 189 lb (85.7 kg)  07/01/15 189 lb (85.7 kg)  05/22/15 189 lb 1.9 oz (85.8 kg)  04/30/15 194 lb (88 kg)  03/24/15 199 lb 6.4 oz (90.4 kg)  06/20/13 215 lb (97.5 kg)  06/06/13 212 lb (96.2 kg)  05/26/12 221 lb (100.2 kg)  10/26/11 217 lb (98.4 kg)  10/13/11 217 lb (98.4 kg)  09/13/11 219 lb (99.3 kg)     ALLERGIES: No Known Allergies  MEDICATIONS: Current Outpatient Prescriptions on File Prior to Visit  Medication Sig Dispense Refill  . ALPRAZolam (XANAX) 0.25 MG tablet  Take 0.25 mg by mouth as needed.     [name] aspirin 325 MG tablet Take 325 mg by mouth daily.    [name] b complex vitamins tablet Take 1 tablet by mouth daily.    [name] buPROPion (WELLBUTRIN SR) 150 MG 12 hr tablet Take 1 tablet (150 mg total) by mouth every morning. 30 tablet 0  . Cholecalciferol 2000 units CAPS Take 1 capsule (2,000 Units total) by mouth daily. 30 each   . clopidogrel (PLAVIX) 75 MG tablet TAKE 1 TABLET BY MOUTH DAILY. 90 tablet 3  . Coenzyme Q10 (CO Q10) 100 MG CAPS Take 1 capsule by mouth daily.    [name] losartan-hydrochlorothiazide (HYZAAR) 100-25 MG tablet TAKE 1 TABLET BY MOUTH DAILY. 90 tablet 3  . metFORMIN (GLUCOPHAGE) 500 MG tablet Take 1 tablet (500 mg total) by mouth daily with breakfast. 30 tablet 0  . metoprolol tartrate (LOPRESSOR) 25 MG tablet Take 1 tablet (25 mg total) by mouth 2 (two) times daily. 180  tablet 1  . nitroGLYCERIN (NITROSTAT) 0.4 MG SL tablet 1 TABLET UNDER TONGUE EVERY 5 MINUTES UP TO 3 TIMES FOR CHEST PAIN THEN CALL DR IF NO RELIEF 25 tablet 3  . Omega-3 Fatty Acids (FISH OIL) 1000 MG CAPS 1 capsule once. Take 1  Tabs qd    . rosuvastatin (CRESTOR) 10 MG tablet Take 1 tablet (10 mg total) by mouth every other day. 90 tablet 1  . traZODone (DESYREL) 100 MG tablet Take 0.5-1 tablets (50-100 mg total) by mouth at bedtime. 90 tablet 3   Current Facility-Administered Medications on File Prior to Visit  Medication Dose Route Frequency Provider Last Rate Last Dose  . methylPREDNISolone acetate (DEPO-MEDROL) injection 80 mg  80 mg Intramuscular Once Dettinger, Fransisca Kaufmann, MD        PAST MEDICAL HISTORY: Past Medical History:  Diagnosis Date  . Anxiety   . Back pain   . CAD (coronary artery disease)   . Chest pain   . Complication of anesthesia   . Constipation   . Diverticulitis   . Diverticulosis   . Heart attack ([place]) 2010  . Hyperlipemia   . Hypertension   . Joint pain   . PONV (postoperative nausea and vomiting)   . Skin cancer   . Sleep apnea    wears CPAP nightly  . Vitamin D deficiency     PAST SURGICAL HISTORY: Past Surgical History:  Procedure Laterality Date  . APPENDECTOMY    . BACK SURGERY    . CARPAL TUNNEL RELEASE Right   . CARPAL TUNNEL RELEASE Left 08/13/2016   Procedure: LEFT CARPAL TUNNEL RELEASE;  Surgeon: Roseanne Kaufman, MD;  Location: [facility];  Service: Orthopedics;  Laterality: Left;  . CORONARY STENT PLACEMENT    . ENDOMETRIAL ABLATION  2005  . MOHS SURGERY    . TUBAL LIGATION  1991    SOCIAL HISTORY: Social History  Substance Use Topics  . Smoking status: Former Research scientist (life sciences)  . Smokeless tobacco: Never Used  . Alcohol use Yes     Comment: Rare     FAMILY HISTORY: Family History  Problem Relation Age of Onset  . Hyperlipidemia Mother   . Hypertension Mother   . Cancer Father     skin  . Hyperlipidemia Father   .  Dementia Father   . Arthritis Father   . Hypertension Father   . Sleep apnea Father   . Other Sister     Myelofibrosis  . Arthritis Sister   .  Hyperlipidemia Sister   . Arthritis Sister   . Hyperlipidemia Sister   . Diabetes Paternal Grandfather   . Colon cancer Neg Hx     ROS: Review of Systems  Constitutional: Positive for weight loss.  Genitourinary: Negative for frequency.  Endo/Heme/Allergies: Negative for polydipsia.  Psychiatric/Behavioral: Positive for depression. Negative for suicidal ideas.       Stress    PHYSICAL EXAM: Blood pressure 107/61, pulse 71, temperature 97.7 F (36.5 C), temperature source Oral, height 5\' 3"  (1.6 m), weight 192 lb (87.1 kg), last menstrual period 12/31/2013, SpO2 96 %. Body mass index is 34.01 kg/m. Physical Exam  Constitutional: She is oriented to person, place, and time. She appears well-developed and well-nourished.  Cardiovascular: Normal rate.   Pulmonary/Chest: Effort normal.  Musculoskeletal: Normal range of motion.  Neurological: She is oriented to person, place, and time.  Skin: Skin is warm and dry.  Psychiatric: She has a normal mood and affect. Her behavior is normal.  Vitals reviewed.   RECENT LABS AND TESTS: BMET    Component Value Date/Time   NA 137 11/19/2016 2043   NA 142 11/01/2016 1307   K 3.4 (L) 11/19/2016 2043   CL 98 (L) 11/19/2016 2043   CO2 25 11/19/2016 2043   GLUCOSE 138 (H) 11/19/2016 2043   BUN 22 (H) 11/19/2016 2043   BUN 14 11/01/2016 1307   CREATININE 0.78 11/19/2016 2043   CALCIUM 9.8 11/19/2016 2043   GFRNONAA >60 11/19/2016 2043   GFRAA >60 11/19/2016 2043   Lab Results  Component Value Date   HGBA1C 5.8 (H) 11/01/2016   HGBA1C 6.0 (H) 07/28/2016   HGBA1C  10/07/2008    5.6 (NOTE)   The ADA recommends the following therapeutic goal for glycemic   control related to Hgb A1C measurement:   Goal of Therapy:   < 7.0% Hgb A1C   Reference: American Diabetes Association: Clinical Practice    Recommendations 2008, Diabetes Care,  2008, 31:(Suppl 1).   Lab Results  Component Value Date   INSULIN 19.6 11/01/2016   INSULIN 29.2 (H) 07/28/2016   CBC    Component Value Date/Time   WBC 14.3 (H) 11/19/2016 2043   RBC 5.32 (H) 11/19/2016 2043   HGB 14.7 11/19/2016 2043   HCT 44.8 11/19/2016 2043   HCT 39.3 07/28/2016 1600   PLT 265 11/19/2016 2043   PLT 226 03/24/2015 1026   MCV 84.2 11/19/2016 2043   MCV 82 07/28/2016 1600   MCH 27.6 11/19/2016 2043   MCHC 32.8 11/19/2016 2043   RDW 13.6 11/19/2016 2043   RDW 14.5 07/28/2016 1600   LYMPHSABS 2.9 07/28/2016 1600   MONOABS 0.7 10/05/2008 1425   EOSABS 0.2 07/28/2016 1600   BASOSABS 0.1 07/28/2016 1600   Iron/TIBC/Ferritin/ %Sat No results found for: IRON, TIBC, FERRITIN, IRONPCTSAT Lipid Panel     Component Value Date/Time   CHOL 133 07/28/2016 1600   TRIG 143 07/28/2016 1600   TRIG 156 (H) 06/06/2013 1147   HDL 47 07/28/2016 1600   HDL 50 06/06/2013 1147   CHOLHDL 3.4 06/29/2016 0822   CHOLHDL 6.1 10/07/2008 0500   VLDL 26 10/07/2008 0500   LDLCALC 57 07/28/2016 1600   LDLCALC 50 06/06/2013 1147   Hepatic Function Panel     Component Value Date/Time   PROT 7.9 11/19/2016 2043   PROT 6.8 11/01/2016 1307   ALBUMIN 4.2 11/19/2016 2043   ALBUMIN 4.4 11/01/2016 1307   AST 27 11/19/2016 2043   ALT 22 11/19/2016  2043   ALKPHOS 63 11/19/2016 2043   BILITOT 0.8 11/19/2016 2043   BILITOT 0.3 11/01/2016 1307     ASSESSMENT AND PLAN: Other depression - Plan: buPROPion (WELLBUTRIN SR) 150 MG 12 hr tablet  At risk for diabetes mellitus  Class 1 obesity without serious comorbidity with body mass index (BMI) of 34.0 to 34.9 in adult, unspecified obesity type  PLAN:  Depression with Emotional Eating Behaviors We discussed behavior modification techniques today to help Quinisha deal with her emotional eating and depression. She has agreed to decrease Lexapro to 10 mg qd [place] is to cut in half) and continue to  take Wellbutrin SR 150 mg qd #30 with no refills and agreed to follow up as directed.  Diabetes risk counselling Jasper was given extended (at least 15 minutes) diabetes prevention counseling today. She is 56 y.o. female and has risk factors for diabetes including obesity. We discussed intensive lifestyle modifications today with an emphasis on weight loss as well as increasing exercise and decreasing simple carbohydrates in her diet.  Obesity Shirla is currently in the action stage of change. As such, her goal is to continue with weight loss efforts She has agreed to follow the Category 3 plan Cathleen has been instructed to work up to a goal of 150 minutes of combined cardio and strengthening exercise per week for weight loss and overall health benefits. We discussed the following Behavioral Modification Stratagies today: increasing lean protein intake and decreasing simple carbohydrates   Shakinah has agreed to follow up with our clinic in 2 weeks. She was informed of the importance of frequent follow up visits to maximize her success with intensive lifestyle modifications for her multiple health conditions.  I, Doreene Nest, am acting as scribe for Dennard Nip, MD  I have reviewed the above documentation for accuracy and completeness, and I agree with the above. -Dennard Nip, MD

## 2016-12-09 ENCOUNTER — Other Ambulatory Visit: Payer: Self-pay | Admitting: *Deleted

## 2016-12-09 MED ORDER — ROSUVASTATIN CALCIUM 10 MG PO TABS: 10.0000 mg | ORAL_TABLET | Freq: Every day | ORAL | 1 refills | Status: DC

## 2016-12-10 DIAGNOSIS — G4733 Obstructive sleep apnea (adult) (pediatric): Secondary | ICD-10-CM | POA: Diagnosis not present

## 2016-12-16 MED FILL — DULoxetine HCL 30 MG CPEP: 30 | 90 days supply | Qty: 180 | Fill #0

## 2016-12-18 DIAGNOSIS — G4733 Obstructive sleep apnea (adult) (pediatric): Secondary | ICD-10-CM | POA: Diagnosis not present

## 2016-12-20 ENCOUNTER — Other Ambulatory Visit: Payer: Self-pay

## 2016-12-21 ENCOUNTER — Ambulatory Visit (INDEPENDENT_AMBULATORY_CARE_PROVIDER_SITE_OTHER): Payer: 59 | Admitting: Family Medicine

## 2016-12-21 VITALS — BP 109/55 | HR 77 | Temp 98.0°F | Ht 63.0 in | Wt 189.0 lb

## 2016-12-21 DIAGNOSIS — F3289 Other specified depressive episodes: Secondary | ICD-10-CM | POA: Diagnosis not present

## 2016-12-21 DIAGNOSIS — E669 Obesity, unspecified: Secondary | ICD-10-CM

## 2016-12-21 DIAGNOSIS — Z6833 Body mass index (BMI) 33.0-33.9, adult: Secondary | ICD-10-CM | POA: Diagnosis not present

## 2016-12-21 DIAGNOSIS — Z9189 Other specified personal risk factors, not elsewhere classified: Secondary | ICD-10-CM

## 2016-12-21 DIAGNOSIS — R7303 Prediabetes: Secondary | ICD-10-CM | POA: Diagnosis not present

## 2016-12-21 DIAGNOSIS — E66811 Obesity, class 1: Secondary | ICD-10-CM

## 2016-12-21 MED ORDER — METFORMIN HCL 500 MG PO TABS: 500.0000 mg | ORAL_TABLET | Freq: Two times a day (BID) | ORAL | 0 refills | Status: DC

## 2016-12-21 MED ORDER — BUPROPION HCL ER (SR) 150 MG PO TB12: 150.0000 mg | ORAL_TABLET | Freq: Every day | ORAL | 0 refills | Status: DC

## 2016-12-21 MED FILL — traZODone HCL 100 MG TABS: 100 | 90 days supply | Qty: 90 | Fill #0

## 2016-12-21 MED FILL — metFORMIN HCL 500 MG TABS: 500 | 30 days supply | Qty: 60 | Fill #0

## 2016-12-21 MED FILL — BUPROPION SR 150 MG TABLET: 150 | 30 days supply | Qty: 30 | Fill #0

## 2016-12-21 NOTE — Progress Notes (Signed)
Office: [phone]  /  Fax: [phone]   HPI:   Chief Complaint: OBESITY Heather Lam is here to discuss her progress with her obesity treatment plan. She is on the  follow the Category 3 plan and is following her eating plan approximately 75 % of the time. She states she is walking 30 to 45 minutes 3 to 4 times per week. Heather Lam continues to do well with weight loss. She is deviating from lunch more but still making better choices. She would like additional ideas for healthier crunchy snacks. Her weight is 189 lb (85.7 kg) today and has had a weight loss of 3 pounds over a period of 2 weeks since her last visit. She has lost 26 lbs since starting treatment with [place].  Depression with emotional eating behaviors Heather Lam's mood is good on Cymbalta, Wellbutrin and Trazadone to help her sleep. She notes decreased emotional eating but still struggles between dinner and bedtime. Heather Lam struggles with emotional eating and using food for comfort to the extent that it is negatively impacting her health. She often snacks when she is not hungry. Heather Lam sometimes feels she is out of control and then feels guilty that she made poor food choices. She has been working on behavior modification techniques to help reduce her emotional eating and has been somewhat successful. She shows no sign of suicidal or homicidal ideations.  Pre-Diabetes Heather Lam has a diagnosis of pre-diabetes based on her elevated Hgb A1c and was informed this puts her at greater risk of developing diabetes. She is taking metformin currently and continues to work on diet and exercise to decrease risk of diabetes. She notes decreased polyphagia but is still struggling later in the day. She denies nausea, vomiting or hypoglycemia.  At risk for diabetes Heather Lam is at higher than average risk for developing diabetes due to her obesity and pre-diabetes. She currently denies polyuria or polydipsia.  Depression screen [facility] 2/9 07/28/2016 06/03/2016  05/24/2016 05/05/2016 02/26/2016  Decreased Interest 2 1 1  0 0  Down, Depressed, Hopeless 2 1 2  0 0  PHQ - 2 Score 4 2 3  0 0  Altered sleeping 1 0 0 - -  Tired, decreased energy 2 1 3  - -  Change in appetite 1 1 2  - -  Feeling bad or failure about yourself  0 0 0 - -  Trouble concentrating 0 1 1 - -  Moving slowly or fidgety/restless 0 0 0 - -  Suicidal thoughts 0 0 0 - -  PHQ-9 Score 8 5 9  - -  Difficult doing work/chores - - Somewhat difficult - -      ALLERGIES: No Known Allergies  MEDICATIONS: Current Outpatient Prescriptions on File Prior to Visit  Medication Sig Dispense Refill  . ALPRAZolam (XANAX) 0.25 MG tablet Take 0.25 mg by mouth as needed.     [name] aspirin 325 MG tablet Take 325 mg by mouth daily.    [name] b complex vitamins tablet Take 1 tablet by mouth daily.    [name] buPROPion (WELLBUTRIN SR) 150 MG 12 hr tablet Take 1 tablet (150 mg total) by mouth daily. 30 tablet 0  . Cholecalciferol 2000 units CAPS Take 1 capsule (2,000 Units total) by mouth daily. 30 each   . clopidogrel (PLAVIX) 75 MG tablet TAKE 1 TABLET BY MOUTH DAILY. 90 tablet 3  . Coenzyme Q10 (CO Q10) 100 MG CAPS Take 1 capsule by mouth daily.    . DULoxetine (CYMBALTA) 30 MG capsule Take 60 mg by mouth daily.  3  . losartan-hydrochlorothiazide (HYZAAR) 100-25 MG tablet TAKE 1 TABLET BY MOUTH DAILY. 90 tablet 3  . metoprolol tartrate (LOPRESSOR) 25 MG tablet Take 1 tablet (25 mg total) by mouth 2 (two) times daily. 180 tablet 1  . nitroGLYCERIN (NITROSTAT) 0.4 MG SL tablet 1 TABLET UNDER TONGUE EVERY 5 MINUTES UP TO 3 TIMES FOR CHEST PAIN THEN CALL DR IF NO RELIEF 25 tablet 3  . Omega-3 Fatty Acids (FISH OIL) 1000 MG CAPS 1 capsule once. Take 1  Tabs qd    . rosuvastatin (CRESTOR) 10 MG tablet Take 1 tablet (10 mg total) by mouth daily. 90 tablet 1  . traZODone (DESYREL) 100 MG tablet Take 0.5-1 tablets (50-100 mg total) by mouth at bedtime. 90 tablet 3   Current Facility-Administered Medications on File Prior to  Visit  Medication Dose Route Frequency Provider Last Rate Last Dose  . methylPREDNISolone acetate (DEPO-MEDROL) injection 80 mg  80 mg Intramuscular Once Dettinger, Fransisca Kaufmann, MD        PAST MEDICAL HISTORY: Past Medical History:  Diagnosis Date  . Anxiety   . Back pain   . CAD (coronary artery disease)   . Chest pain   . Complication of anesthesia   . Constipation   . Diverticulitis   . Diverticulosis   . Heart attack ([place]) 2010  . Hyperlipemia   . Hypertension   . Joint pain   . PONV (postoperative nausea and vomiting)   . Skin cancer   . Sleep apnea    wears CPAP nightly  . Vitamin D deficiency     PAST SURGICAL HISTORY: Past Surgical History:  Procedure Laterality Date  . APPENDECTOMY    . BACK SURGERY    . CARPAL TUNNEL RELEASE Right   . CARPAL TUNNEL RELEASE Left 08/13/2016   Procedure: LEFT CARPAL TUNNEL RELEASE;  Surgeon: Roseanne Kaufman, MD;  Location: [place];  Service: Orthopedics;  Laterality: Left;  . CORONARY STENT PLACEMENT    . ENDOMETRIAL ABLATION  2005  . MOHS SURGERY    . TUBAL LIGATION  1991    SOCIAL HISTORY: Social History  Substance Use Topics  . Smoking status: Former Research scientist (life sciences)  . Smokeless tobacco: Never Used  . Alcohol use Yes     Comment: Rare     FAMILY HISTORY: Family History  Problem Relation Age of Onset  . Hyperlipidemia Mother   . Hypertension Mother   . Cancer Father        skin  . Hyperlipidemia Father   . Dementia Father   . Arthritis Father   . Hypertension Father   . Sleep apnea Father   . Other Sister        Myelofibrosis  . Arthritis Sister   . Hyperlipidemia Sister   . Arthritis Sister   . Hyperlipidemia Sister   . Diabetes Paternal Grandfather   . Colon cancer Neg Hx     ROS: Review of Systems  Constitutional: Positive for weight loss.  Genitourinary: Negative for frequency.  Endo/Heme/Allergies: Negative for polydipsia.       Polyphagia  Psychiatric/Behavioral: Positive for depression.  Negative for suicidal ideas.    PHYSICAL EXAM: Blood pressure (!) 109/55, pulse 77, temperature 98 F (36.7 C), temperature source Oral, height 5\' 3"  (1.6 m), weight 189 lb (85.7 kg), last menstrual period 12/31/2013, SpO2 97 %. Body mass index is 33.48 kg/m. Physical Exam  Constitutional: She is oriented to person, place, and time. She appears well-developed and well-nourished.  Cardiovascular:  Normal rate.   Pulmonary/Chest: Effort normal.  Musculoskeletal: Normal range of motion.  Neurological: She is oriented to person, place, and time.  Skin: Skin is warm and dry.  Psychiatric: She has a normal mood and affect. Her behavior is normal.  Vitals reviewed.   RECENT LABS AND TESTS: BMET    Component Value Date/Time   NA 137 11/19/2016 2043   NA 142 11/01/2016 1307   K 3.4 (L) 11/19/2016 2043   CL 98 (L) 11/19/2016 2043   CO2 25 11/19/2016 2043   GLUCOSE 138 (H) 11/19/2016 2043   BUN 22 (H) 11/19/2016 2043   BUN 14 11/01/2016 1307   CREATININE 0.78 11/19/2016 2043   CALCIUM 9.8 11/19/2016 2043   GFRNONAA >60 11/19/2016 2043   GFRAA >60 11/19/2016 2043   Lab Results  Component Value Date   HGBA1C 5.8 (H) 11/01/2016   HGBA1C 6.0 (H) 07/28/2016   HGBA1C  10/07/2008    5.6 (NOTE)   The ADA recommends the following therapeutic goal for glycemic   control related to Hgb A1C measurement:   Goal of Therapy:   < 7.0% Hgb A1C   Reference: American Diabetes Association: Clinical Practice   Recommendations 2008, Diabetes Care,  2008, 31:(Suppl 1).   Lab Results  Component Value Date   INSULIN 19.6 11/01/2016   INSULIN 29.2 (H) 07/28/2016   CBC    Component Value Date/Time   WBC 14.3 (H) 11/19/2016 2043   RBC 5.32 (H) 11/19/2016 2043   HGB 14.7 11/19/2016 2043   HCT 44.8 11/19/2016 2043   HCT 39.3 07/28/2016 1600   PLT 265 11/19/2016 2043   PLT 226 03/24/2015 1026   MCV 84.2 11/19/2016 2043   MCV 82 07/28/2016 1600   MCH 27.6 11/19/2016 2043   MCHC 32.8 11/19/2016  2043   RDW 13.6 11/19/2016 2043   RDW 14.5 07/28/2016 1600   LYMPHSABS 2.9 07/28/2016 1600   MONOABS 0.7 10/05/2008 1425   EOSABS 0.2 07/28/2016 1600   BASOSABS 0.1 07/28/2016 1600   Iron/TIBC/Ferritin/ %Sat No results found for: IRON, TIBC, FERRITIN, IRONPCTSAT Lipid Panel     Component Value Date/Time   CHOL 133 07/28/2016 1600   TRIG 143 07/28/2016 1600   TRIG 156 (H) 06/06/2013 1147   HDL 47 07/28/2016 1600   HDL 50 06/06/2013 1147   CHOLHDL 3.4 06/29/2016 0822   CHOLHDL 6.1 10/07/2008 0500   VLDL 26 10/07/2008 0500   LDLCALC 57 07/28/2016 1600   LDLCALC 50 06/06/2013 1147   Hepatic Function Panel     Component Value Date/Time   PROT 7.9 11/19/2016 2043   PROT 6.8 11/01/2016 1307   ALBUMIN 4.2 11/19/2016 2043   ALBUMIN 4.4 11/01/2016 1307   AST 27 11/19/2016 2043   ALT 22 11/19/2016 2043   ALKPHOS 63 11/19/2016 2043   BILITOT 0.8 11/19/2016 2043   BILITOT 0.3 11/01/2016 1307     ASSESSMENT AND PLAN: Other depression - Plan: buPROPion (WELLBUTRIN SR) 150 MG 12 hr tablet  Prediabetes  At risk for diabetes mellitus  Class 1 obesity without serious comorbidity with body mass index (BMI) of 33.0 to 33.9 in adult, unspecified obesity type  PLAN:  Depression with Emotional Eating Behaviors We discussed behavior modification techniques today to help Heather Lam deal with her emotional eating and depression. She has agreed to take Wellbutrin SR 150 mg qd and agreed to follow up as directed.  Pre-Diabetes Heather Lam will continue to work on weight loss, exercise, and decreasing simple carbohydrates in her  diet to help decrease the risk of diabetes. We dicussed metformin including benefits and risks. She was informed that eating too many simple carbohydrates or too many calories at one sitting increases the likelihood of GI side effects. Heather Lam agrees to increase Metformin to 500 mg bid #60 with no refills and follow up with [place] as directed to monitor her progress.  Diabetes  risk counselling Heather Lam was given extended (at least 15 minutes) diabetes prevention counseling today. She is 56 y.o. female and has risk factors for diabetes including obesity and pre-diabetes. We discussed intensive lifestyle modifications today with an emphasis on weight loss as well as increasing exercise and decreasing simple carbohydrates in her diet.  Obesity Heather Lam is currently in the action stage of change. As such, her goal is to continue with weight loss efforts She has agreed to follow the Category 3 plan Heather Lam has been instructed to work up to a goal of 150 minutes of combined cardio and strengthening exercise per week or continue walking for 30 to 45 minutes 3 to 4 times per week for weight loss and overall health benefits. We discussed the following Behavioral Modification Strategies today: increasing lean protein intake and increasing lower sugar fruits  Heather Lam has agreed to follow up with our clinic in 2 weeks. She was informed of the importance of frequent follow up visits to maximize her success with intensive lifestyle modifications for her multiple health conditions.  I, Heather Lam, am acting as scribe for Dennard Nip, MD  I have reviewed the above documentation for accuracy and completeness, and I agree with the above. -Dennard Nip, MD  OBESITY BEHAVIORAL INTERVENTION VISIT  Today's visit was # 10 out of 22.  Starting weight: 215 Starting date: 07/28/16 Todays weight : 189 Total lbs lost to date: 19 (Patients must lose 7 lbs in the first 6 months to continue with counseling)   ASK: We discussed the diagnosis of obesity with Heather Lam today and Heather Lam agreed to give [place] permission to discuss obesity behavioral modification therapy today.  ASSESS: Heather Lam has the diagnosis of obesity and her BMI today is 33.5 Heather Lam is in the action stage of change   ADVISE: Heather Lam was educated on the multiple health risks of obesity as well as the benefit of weight  loss to improve her health. She was advised of the need for long term treatment and the importance of lifestyle modifications.  AGREE: Multiple dietary modification options and treatment options were discussed and  Heather Lam agreed to follow the Category 3 plan We discussed the following Behavioral Modification Strategies today: increasing lean protein intake and increasing lower sugar fruits

## 2017-01-03 ENCOUNTER — Telehealth: Payer: Self-pay

## 2017-01-03 MED ORDER — BENZONATATE 200 MG PO CAPS: 200.0000 mg | ORAL_CAPSULE | Freq: Two times a day (BID) | ORAL | 0 refills | Status: DC | PRN

## 2017-01-03 MED ORDER — AZITHROMYCIN 250 MG PO TABS: ORAL_TABLET | ORAL | 0 refills | Status: DC

## 2017-01-03 NOTE — Telephone Encounter (Signed)
Patient aware. Depo 80 given.

## 2017-01-03 NOTE — Telephone Encounter (Signed)
Patient has had cough, chest congestion, and wheezing for longer than a week. OTC have not worked. Please advise

## 2017-01-11 ENCOUNTER — Ambulatory Visit (INDEPENDENT_AMBULATORY_CARE_PROVIDER_SITE_OTHER): Payer: 59 | Admitting: Family Medicine

## 2017-01-11 VITALS — BP 115/53 | HR 67 | Temp 98.6°F | Ht 63.0 in | Wt 188.0 lb

## 2017-01-11 DIAGNOSIS — E669 Obesity, unspecified: Secondary | ICD-10-CM

## 2017-01-11 DIAGNOSIS — F3289 Other specified depressive episodes: Secondary | ICD-10-CM

## 2017-01-11 DIAGNOSIS — Z6833 Body mass index (BMI) 33.0-33.9, adult: Secondary | ICD-10-CM

## 2017-01-11 MED ORDER — BUPROPION HCL ER (SR) 150 MG PO TB12: 150.0000 mg | ORAL_TABLET | Freq: Every day | ORAL | 0 refills | Status: DC

## 2017-01-11 NOTE — Progress Notes (Signed)
Office: [phone]  /  Fax: [phone]   HPI:   Chief Complaint: OBESITY Heather Lam is here to discuss her progress with her obesity treatment plan. She is on the  follow the Category 3 plan and is following her eating plan approximately 90 % of the time. She states she is walking 30 to 45 minutes 3 to 4 times per week. Heather Lam  Continues to do well with weight loss. She is still struggling with work sabotage and is often skipping breakfast. Her weight is 188 lb (85.3 kg) today and has had a weight loss of 1 pound over a period of 3 weeks since her last visit. She has lost 1 lbs since starting treatment with [place].  Depression with emotional eating behaviors Heather Lam's mood is stable and she is doing better on decreasing emotional eating. She has no insomnia and blood pressure is stable. Heather Lam struggles with emotional eating and using food for comfort to the extent that it is negatively impacting her health. She often snacks when she is not hungry. Heather Lam sometimes feels she is out of control and then feels guilty that she made poor food choices. She has been working on behavior modification techniques to help reduce her emotional eating and has been somewhat successful. She shows no sign of suicidal or homicidal ideations.  Depression screen Heather Lam 2/9 07/28/2016 06/03/2016 05/24/2016 05/05/2016 02/26/2016  Decreased Interest 2 1 1  0 0  Down, Depressed, Hopeless 2 1 2  0 0  PHQ - 2 Score 4 2 3  0 0  Altered sleeping 1 0 0 - -  Tired, decreased energy 2 1 3  - -  Change in appetite 1 1 2  - -  Feeling bad or failure about yourself  0 0 0 - -  Trouble concentrating 0 1 1 - -  Moving slowly or fidgety/restless 0 0 0 - -  Suicidal thoughts 0 0 0 - -  PHQ-9 Score 8 5 9  - -  Difficult doing work/chores - - Somewhat difficult - -      ALLERGIES: No Known Allergies  MEDICATIONS: Current Outpatient Prescriptions on File Prior to Visit  Medication Sig Dispense Refill  . ALPRAZolam (XANAX) 0.25 MG  tablet Take 0.25 mg by mouth as needed.     [name] aspirin 325 MG tablet Take 325 mg by mouth daily.    [name] b complex vitamins tablet Take 1 tablet by mouth daily.    . Cholecalciferol 2000 units CAPS Take 1 capsule (2,000 Units total) by mouth daily. 30 each   . clopidogrel (PLAVIX) 75 MG tablet TAKE 1 TABLET BY MOUTH DAILY. 90 tablet 3  . Coenzyme Q10 (CO Q10) 100 MG CAPS Take 1 capsule by mouth daily.    . DULoxetine (CYMBALTA) 30 MG capsule Take 60 mg by mouth daily.  3  . losartan-hydrochlorothiazide (HYZAAR) 100-25 MG tablet TAKE 1 TABLET BY MOUTH DAILY. 90 tablet 3  . metFORMIN (GLUCOPHAGE) 500 MG tablet Take 1 tablet (500 mg total) by mouth 2 (two) times daily with a meal. 60 tablet 0  . metoprolol tartrate (LOPRESSOR) 25 MG tablet Take 1 tablet (25 mg total) by mouth 2 (two) times daily. 180 tablet 1  . nitroGLYCERIN (NITROSTAT) 0.4 MG SL tablet 1 TABLET UNDER TONGUE EVERY 5 MINUTES UP TO 3 TIMES FOR CHEST PAIN THEN CALL DR IF NO RELIEF 25 tablet 3  . Omega-3 Fatty Acids (FISH OIL) 1000 MG CAPS 1 capsule once. Take 1  Tabs qd    . rosuvastatin (CRESTOR)  10 MG tablet Take 1 tablet (10 mg total) by mouth daily. 90 tablet 1  . traZODone (DESYREL) 100 MG tablet Take 0.5-1 tablets (50-100 mg total) by mouth at bedtime. 90 tablet 3   Current Facility-Administered Medications on File Prior to Visit  Medication Dose Route Frequency Provider Last Rate Last Dose  . methylPREDNISolone acetate (DEPO-MEDROL) injection 80 mg  80 mg Intramuscular Once Dettinger, Fransisca Kaufmann, MD        PAST MEDICAL HISTORY: Past Medical History:  Diagnosis Date  . Anxiety   . Back pain   . CAD (coronary artery disease)   . Chest pain   . Complication of anesthesia   . Constipation   . Diverticulitis   . Diverticulosis   . Heart attack ([place]) 2010  . Hyperlipemia   . Hypertension   . Joint pain   . PONV (postoperative nausea and vomiting)   . Skin cancer   . Sleep apnea    wears CPAP nightly  . Vitamin D  deficiency     PAST SURGICAL HISTORY: Past Surgical History:  Procedure Laterality Date  . APPENDECTOMY    . BACK SURGERY    . CARPAL TUNNEL RELEASE Right   . CARPAL TUNNEL RELEASE Left 08/13/2016   Procedure: LEFT CARPAL TUNNEL RELEASE;  Surgeon: Roseanne Kaufman, MD;  Location: [facility];  Service: Orthopedics;  Laterality: Left;  . CORONARY STENT PLACEMENT    . ENDOMETRIAL ABLATION  2005  . MOHS SURGERY    . TUBAL LIGATION  1991    SOCIAL HISTORY: Social History  Substance Use Topics  . Smoking status: Former Research scientist (life sciences)  . Smokeless tobacco: Never Used  . Alcohol use Yes     Comment: Rare     FAMILY HISTORY: Family History  Problem Relation Age of Onset  . Hyperlipidemia Mother   . Hypertension Mother   . Cancer Father        skin  . Hyperlipidemia Father   . Dementia Father   . Arthritis Father   . Hypertension Father   . Sleep apnea Father   . Other Sister        Myelofibrosis  . Arthritis Sister   . Hyperlipidemia Sister   . Arthritis Sister   . Hyperlipidemia Sister   . Diabetes Paternal Grandfather   . Colon cancer Neg Hx     ROS: Review of Systems  Constitutional: Positive for weight loss.  Psychiatric/Behavioral: Positive for depression. Negative for suicidal ideas. The patient does not have insomnia.     PHYSICAL EXAM: Blood pressure (!) 115/53, pulse 67, temperature 98.6 F (37 C), temperature source Oral, height 5\' 3"  (1.6 m), weight 188 lb (85.3 kg), last menstrual period 12/31/2013, SpO2 97 %. Body mass index is 33.3 kg/m. Physical Exam  Constitutional: She is oriented to person, place, and time. She appears well-developed and well-nourished.  Cardiovascular: Normal rate.   Pulmonary/Chest: Effort normal.  Musculoskeletal: Normal range of motion.  Neurological: She is oriented to person, place, and time.  Skin: Skin is warm and dry.  Psychiatric: She has a normal mood and affect. Her behavior is normal.  Vitals  reviewed.   RECENT LABS AND TESTS: BMET    Component Value Date/Time   NA 137 11/19/2016 2043   NA 142 11/01/2016 1307   K 3.4 (L) 11/19/2016 2043   CL 98 (L) 11/19/2016 2043   CO2 25 11/19/2016 2043   GLUCOSE 138 (H) 11/19/2016 2043   BUN 22 (H) 11/19/2016 2043  BUN 14 11/01/2016 1307   CREATININE 0.78 11/19/2016 2043   CALCIUM 9.8 11/19/2016 2043   GFRNONAA >60 11/19/2016 2043   GFRAA >60 11/19/2016 2043   Lab Results  Component Value Date   HGBA1C 5.8 (H) 11/01/2016   HGBA1C 6.0 (H) 07/28/2016   HGBA1C  10/07/2008    5.6 (NOTE)   The ADA recommends the following therapeutic goal for glycemic   control related to Hgb A1C measurement:   Goal of Therapy:   < 7.0% Hgb A1C   Reference: American Diabetes Association: Clinical Practice   Recommendations 2008, Diabetes Care,  2008, 31:(Suppl 1).   Lab Results  Component Value Date   INSULIN 19.6 11/01/2016   INSULIN 29.2 (H) 07/28/2016   CBC    Component Value Date/Time   WBC 14.3 (H) 11/19/2016 2043   RBC 5.32 (H) 11/19/2016 2043   HGB 14.7 11/19/2016 2043   HGB 13.0 07/28/2016 1600   HCT 44.8 11/19/2016 2043   HCT 39.3 07/28/2016 1600   PLT 265 11/19/2016 2043   PLT 226 03/24/2015 1026   MCV 84.2 11/19/2016 2043   MCV 82 07/28/2016 1600   MCH 27.6 11/19/2016 2043   MCHC 32.8 11/19/2016 2043   RDW 13.6 11/19/2016 2043   RDW 14.5 07/28/2016 1600   LYMPHSABS 2.9 07/28/2016 1600   MONOABS 0.7 10/05/2008 1425   EOSABS 0.2 07/28/2016 1600   BASOSABS 0.1 07/28/2016 1600   Iron/TIBC/Ferritin/ %Sat No results found for: IRON, TIBC, FERRITIN, IRONPCTSAT Lipid Panel     Component Value Date/Time   CHOL 133 07/28/2016 1600   TRIG 143 07/28/2016 1600   TRIG 156 (H) 06/06/2013 1147   HDL 47 07/28/2016 1600   HDL 50 06/06/2013 1147   CHOLHDL 3.4 06/29/2016 0822   CHOLHDL 6.1 10/07/2008 0500   VLDL 26 10/07/2008 0500   LDLCALC 57 07/28/2016 1600   LDLCALC 50 06/06/2013 1147   Hepatic Function Panel      Component Value Date/Time   PROT 7.9 11/19/2016 2043   PROT 6.8 11/01/2016 1307   ALBUMIN 4.2 11/19/2016 2043   ALBUMIN 4.4 11/01/2016 1307   AST 27 11/19/2016 2043   ALT 22 11/19/2016 2043   ALKPHOS 63 11/19/2016 2043   BILITOT 0.8 11/19/2016 2043   BILITOT 0.3 11/01/2016 1307     ASSESSMENT AND PLAN: Other depression - Plan: buPROPion (WELLBUTRIN SR) 150 MG 12 hr tablet  Class 1 obesity without serious comorbidity with body mass index (BMI) of 33.0 to 33.9 in adult, unspecified obesity type  PLAN:  Depression with Emotional Eating Behaviors We discussed behavior modification techniques today to help Heather Lam deal with her emotional eating and depression. She has agreed to continue to  take Wellbutrin SR 150 mg qd, we will refill for 1 month and she agreed to follow up as directed.  Obesity Heather Lam is currently in the action stage of change. As such, her goal is to continue with weight loss efforts She has agreed to keep a food journal with 200 to 400 calories and 20+ grams of protein  and follow the Category 3 plan Heather Lam has been instructed to work up to a goal of 150 minutes of combined cardio and strengthening exercise per week for weight loss and overall health benefits. We discussed the following Behavioral Modification Strategies today: no skipping meals, increasing lean protein intake and family or coworker sabotage  Arcelia has agreed to follow up with our clinic in 3 weeks. She was informed of the importance of frequent  follow up visits to maximize her success with intensive lifestyle modifications for her multiple health conditions.  I, Heather Lam, am acting as scribe for Dennard Nip, MD  I have reviewed the above documentation for accuracy and completeness, and I agree with the above. -Dennard Nip, MD  OBESITY BEHAVIORAL INTERVENTION VISIT  Today's visit was # 11 out of 22.  Starting weight: 215 lbs Starting date: 07/28/16 Today's weight : 188 lbs Today's  date: 01/12/2017 Total lbs lost to date: 39 (Patients must lose 7 lbs in the first 6 months to continue with counseling)   ASK: We discussed the diagnosis of obesity with Heather Lam today and Heather Lam agreed to give [place] permission to discuss obesity behavioral modification therapy today.  ASSESS: Danilynn has the diagnosis of obesity and her BMI today is 33.4 Heather Lam is in the action stage of change   ADVISE: Heather Lam was educated on the multiple health risks of obesity as well as the benefit of weight loss to improve her health. She was advised of the need for long term treatment and the importance of lifestyle modifications.  AGREE: Multiple dietary modification options and treatment options were discussed and  Heather Lam agreed to keep a food journal with 200 to 400 calories and 20+ grams of protein  and follow the Category 3 plan We discussed the following Behavioral Modification Strategies today: no skipping meals, increasing lean protein intake, family or coworker sabotage

## 2017-01-18 DIAGNOSIS — G4733 Obstructive sleep apnea (adult) (pediatric): Secondary | ICD-10-CM | POA: Diagnosis not present

## 2017-01-19 MED FILL — BUPROPION SR 150 MG TABLET: 150 | 30 days supply | Qty: 30 | Fill #0

## 2017-01-25 DIAGNOSIS — G4733 Obstructive sleep apnea (adult) (pediatric): Secondary | ICD-10-CM | POA: Diagnosis not present

## 2017-01-31 ENCOUNTER — Ambulatory Visit (INDEPENDENT_AMBULATORY_CARE_PROVIDER_SITE_OTHER): Payer: 59 | Admitting: Family Medicine

## 2017-02-09 ENCOUNTER — Encounter (INDEPENDENT_AMBULATORY_CARE_PROVIDER_SITE_OTHER): Payer: Self-pay

## 2017-02-09 ENCOUNTER — Ambulatory Visit (INDEPENDENT_AMBULATORY_CARE_PROVIDER_SITE_OTHER): Payer: 59 | Admitting: Physician Assistant

## 2017-02-16 MED FILL — ROSUVASTATIN CALCIUM 10 MG: 10 | 90 days supply | Qty: 90 | Fill #0

## 2017-02-17 ENCOUNTER — Other Ambulatory Visit: Payer: Self-pay | Admitting: Physician Assistant

## 2017-02-17 DIAGNOSIS — G4733 Obstructive sleep apnea (adult) (pediatric): Secondary | ICD-10-CM | POA: Diagnosis not present

## 2017-02-17 MED ORDER — METRONIDAZOLE 500 MG PO TABS: 500.0000 mg | ORAL_TABLET | Freq: Two times a day (BID) | ORAL | 0 refills | Status: DC

## 2017-02-17 MED ORDER — CIPROFLOXACIN HCL 500 MG PO TABS: 500.0000 mg | ORAL_TABLET | Freq: Two times a day (BID) | ORAL | 0 refills | Status: DC

## 2017-02-21 ENCOUNTER — Other Ambulatory Visit: Payer: Self-pay | Admitting: *Deleted

## 2017-02-21 DIAGNOSIS — F3289 Other specified depressive episodes: Secondary | ICD-10-CM

## 2017-02-21 MED ORDER — BUPROPION HCL ER (SR) 150 MG PO TB12: 150.0000 mg | ORAL_TABLET | Freq: Every day | ORAL | 0 refills | Status: DC

## 2017-02-21 MED FILL — BUPROPION SR 150 MG TABLET: 150 | 90 days supply | Qty: 90 | Fill #0

## 2017-02-28 ENCOUNTER — Other Ambulatory Visit: Payer: Self-pay

## 2017-02-28 MED ORDER — METOPROLOL TARTRATE 25 MG PO TABS: 25.0000 mg | ORAL_TABLET | Freq: Two times a day (BID) | ORAL | 1 refills | Status: DC

## 2017-02-28 MED FILL — METOPROLOL TARTRATE 25 MG T: 25 | 90 days supply | Qty: 180 | Fill #0

## 2017-03-14 ENCOUNTER — Other Ambulatory Visit: Payer: Self-pay

## 2017-03-14 MED ORDER — DULOXETINE HCL 30 MG PO CPEP
60.0000 mg | ORAL_CAPSULE | Freq: Every day | ORAL | 1 refills | Status: DC
Start: 2017-03-14 — End: 2017-06-20

## 2017-03-14 MED ORDER — LOSARTAN POTASSIUM-HCTZ 100-25 MG PO TABS
1.0000 | ORAL_TABLET | Freq: Every day | ORAL | 1 refills | Status: DC
Start: 2017-03-14 — End: 2017-06-20

## 2017-03-14 MED ORDER — CLOPIDOGREL BISULFATE 75 MG PO TABS: 75.0000 mg | ORAL_TABLET | Freq: Every day | ORAL | 1 refills | Status: DC

## 2017-03-14 MED FILL — DULoxetine HCL 30 MG CPEP: 30 | 90 days supply | Qty: 180 | Fill #1

## 2017-03-14 MED FILL — LOSARTAN-HCTZ 100-25 MG TAB: 100-25 | 90 days supply | Qty: 90 | Fill #1

## 2017-03-14 MED FILL — CLOPIDOGREL 75 MG TABLET: 75 | 90 days supply | Qty: 90 | Fill #1

## 2017-03-20 DIAGNOSIS — G4733 Obstructive sleep apnea (adult) (pediatric): Secondary | ICD-10-CM | POA: Diagnosis not present

## 2017-03-28 MED FILL — traZODone HCL 100 MG TABS: 100 | 90 days supply | Qty: 90 | Fill #1

## 2017-04-01 ENCOUNTER — Ambulatory Visit (INDEPENDENT_AMBULATORY_CARE_PROVIDER_SITE_OTHER): Payer: 59 | Admitting: Physician Assistant

## 2017-04-01 ENCOUNTER — Encounter: Payer: Self-pay | Admitting: Physician Assistant

## 2017-04-01 VITALS — BP 129/65 | HR 68 | Ht 63.0 in | Wt 188.0 lb

## 2017-04-01 DIAGNOSIS — Z6833 Body mass index (BMI) 33.0-33.9, adult: Secondary | ICD-10-CM

## 2017-04-01 DIAGNOSIS — I252 Old myocardial infarction: Secondary | ICD-10-CM

## 2017-04-01 DIAGNOSIS — R7303 Prediabetes: Secondary | ICD-10-CM | POA: Diagnosis not present

## 2017-04-01 DIAGNOSIS — I2510 Atherosclerotic heart disease of native coronary artery without angina pectoris: Secondary | ICD-10-CM

## 2017-04-01 DIAGNOSIS — E669 Obesity, unspecified: Secondary | ICD-10-CM

## 2017-04-01 DIAGNOSIS — E559 Vitamin D deficiency, unspecified: Secondary | ICD-10-CM

## 2017-04-01 DIAGNOSIS — Z9861 Coronary angioplasty status: Secondary | ICD-10-CM

## 2017-04-01 MED ORDER — LIRAGLUTIDE -WEIGHT MANAGEMENT 18 MG/3ML ~~LOC~~ SOPN: 3.0000 mg | PEN_INJECTOR | Freq: Every day | SUBCUTANEOUS | 1 refills | Status: DC

## 2017-04-01 NOTE — Patient Instructions (Signed)
In a few days you may receive a survey in the mail or online from [employer] regarding your visit with us today. Please take a moment to fill this out. Your feedback is very important to our whole office. It can help us better understand your needs as well as improve your experience and satisfaction. Thank you for taking your time to complete it. We care about you.  Tymon Nemetz, PA-C  

## 2017-04-01 NOTE — Progress Notes (Signed)
BP 129/65   Pulse 68   Ht 5\' 3"  (1.6 m)   Wt 188 lb (85.3 kg)   LMP 12/31/2013   BMI 33.30 kg/m    Subjective:    Patient ID: Heather Lam, female    DOB: Feb 19, 1961, 56 y.o.   MRN: [id number]  HPI: Heather Lam is a 56 y.o. female presenting on 04/01/2017 for Discuss Medication  Patient has been working hard on diet and prediabetes status. She has taken metformin 500 mg BID. She has some reduction in appetite when starting it but has lost its effect. Also taking Wellbutrin, limited weight and appetite effect.  In the past has tried Phentermine, Weight Watchers, Low Carb, Slimfast.  Appetite is always an issue and has not been effective in maintaining. She has known CAD and known history of MI.  She had elevated insulin at 29.2, and A1c ranged from 5.8-6.0 in the past few months (on metformin). I feel she would be an excellent candidate for Saxenda and its weight loss effect but also the diabetes treatment.  Relevant past medical, surgical, family and social history reviewed and updated as indicated. Allergies and medications reviewed and updated.  Past Medical History:  Diagnosis Date  . Anxiety   . Back pain   . CAD (coronary artery disease)   . Chest pain   . Complication of anesthesia   . Constipation   . Diverticulitis   . Diverticulosis   . Heart attack ([place]) 2010  . Hyperlipemia   . Hypertension   . Joint pain   . PONV (postoperative nausea and vomiting)   . Skin cancer   . Sleep apnea    wears CPAP nightly  . Vitamin D deficiency     Past Surgical History:  Procedure Laterality Date  . APPENDECTOMY    . BACK SURGERY    . CARPAL TUNNEL RELEASE Right   . CARPAL TUNNEL RELEASE Left 08/13/2016   Procedure: LEFT CARPAL TUNNEL RELEASE;  Surgeon: Roseanne Kaufman, MD;  Location: [facility];  Service: Orthopedics;  Laterality: Left;  . CORONARY STENT PLACEMENT    . ENDOMETRIAL ABLATION  2005  . MOHS SURGERY    . TUBAL LIGATION  1991    Review of  Systems  Constitutional: Positive for fatigue. Negative for activity change and fever.  HENT: Negative.   Eyes: Negative.   Respiratory: Negative.  Negative for cough.   Cardiovascular: Negative.  Negative for chest pain.  Gastrointestinal: Negative.  Negative for abdominal pain.  Endocrine: Negative.   Genitourinary: Negative.  Negative for dysuria.  Musculoskeletal: Negative.   Skin: Negative.   Neurological: Negative.     Allergies as of 04/01/2017   No Known Allergies     Medication List       Accurate as of 04/01/17  4:06 PM. Always use your most recent med list.          ALPRAZolam 0.25 MG tablet Commonly known as:  XANAX Take 0.25 mg by mouth as needed.   aspirin 325 MG tablet Take 325 mg by mouth daily.   buPROPion 150 MG 12 hr tablet Commonly known as:  WELLBUTRIN SR Take 1 tablet (150 mg total) by mouth daily.   Cholecalciferol 2000 units Caps Take 1 capsule (2,000 Units total) by mouth daily.   clopidogrel 75 MG tablet Commonly known as:  PLAVIX Take 1 tablet (75 mg total) by mouth daily.   Co Q10 100 MG Caps Take 1 capsule by mouth daily.  DULoxetine 30 MG capsule Commonly known as:  CYMBALTA Take 2 capsules (60 mg total) by mouth daily.   Fish Oil 1000 MG Caps 1 capsule once. Take 1  Tabs qd   losartan-hydrochlorothiazide 100-25 MG tablet Commonly known as:  HYZAAR Take 1 tablet by mouth daily.   metFORMIN 500 MG tablet Commonly known as:  GLUCOPHAGE Take 1 tablet (500 mg total) by mouth 2 (two) times daily with a meal.   metoprolol tartrate 25 MG tablet Commonly known as:  LOPRESSOR Take 1 tablet (25 mg total) by mouth 2 (two) times daily.   nitroGLYCERIN 0.4 MG SL tablet Commonly known as:  NITROSTAT 1 TABLET UNDER TONGUE EVERY 5 MINUTES UP TO 3 TIMES FOR CHEST PAIN THEN CALL DR IF NO RELIEF   rosuvastatin 10 MG tablet Commonly known as:  CRESTOR Take 1 tablet (10 mg total) by mouth daily.   traZODone 100 MG tablet Commonly  known as:  DESYREL Take 0.5-1 tablets (50-100 mg total) by mouth at bedtime.          Objective:    BP 129/65   Pulse 68   Ht 5\' 3"  (1.6 m)   Wt 188 lb (85.3 kg)   LMP 12/31/2013   BMI 33.30 kg/m   No Known Allergies  Physical Exam  Constitutional: She is oriented to person, place, and time. She appears well-developed and well-nourished.  HENT:  Head: Normocephalic and atraumatic.  Eyes: Pupils are equal, round, and reactive to light. Conjunctivae and EOM are normal.  Cardiovascular: Normal rate, regular rhythm, normal heart sounds and intact distal pulses.   Pulmonary/Chest: Effort normal and breath sounds normal.  Abdominal: Soft. Bowel sounds are normal.  Neurological: She is alert and oriented to person, place, and time. She has normal reflexes.  Skin: Skin is warm and dry. No rash noted.  Psychiatric: She has a normal mood and affect. Her behavior is normal. Judgment and thought content normal.  Nursing note and vitals reviewed.   Results for orders placed or performed during the hospital encounter of 11/19/16  Lipase, blood  Result Value Ref Range   Lipase 35 11 - 51 U/L  Comprehensive metabolic panel  Result Value Ref Range   Sodium 137 135 - 145 mmol/L   Potassium 3.4 (L) 3.5 - 5.1 mmol/L   Chloride 98 (L) 101 - 111 mmol/L   CO2 25 22 - 32 mmol/L   Glucose, Bld 138 (H) 65 - 99 mg/dL   BUN 22 (H) 6 - 20 mg/dL   Creatinine, Ser 0.78 0.44 - 1.00 mg/dL   Calcium 9.8 8.9 - 10.3 mg/dL   Total Protein 7.9 6.5 - 8.1 g/dL   Albumin 4.2 3.5 - 5.0 g/dL   AST 27 15 - 41 U/L   ALT 22 14 - 54 U/L   Alkaline Phosphatase 63 38 - 126 U/L   Total Bilirubin 0.8 0.3 - 1.2 mg/dL   GFR calc non Af Amer >60 >60 mL/min   GFR calc Af Amer >60 >60 mL/min   Anion gap 14 5 - 15  CBC  Result Value Ref Range   WBC 14.3 (H) 4.0 - 10.5 K/uL   RBC 5.32 (H) 3.87 - 5.11 MIL/uL   Hemoglobin 14.7 12.0 - 15.0 g/dL   HCT 44.8 36.0 - 46.0 %   MCV 84.2 78.0 - 100.0 fL   MCH 27.6 26.0 -  34.0 pg   MCHC 32.8 30.0 - 36.0 g/dL   RDW 13.6 11.5 - 15.5 %  Platelets 265 150 - 400 K/uL  Urinalysis, Routine w reflex microscopic  Result Value Ref Range   Color, Urine YELLOW YELLOW   APPearance CLEAR CLEAR   Specific Gravity, Urine >1.046 (H) 1.005 - 1.030   pH 7.0 5.0 - 8.0   Glucose, UA NEGATIVE NEGATIVE mg/dL   Hgb urine dipstick MODERATE (A) NEGATIVE   Bilirubin Urine NEGATIVE NEGATIVE   Ketones, ur NEGATIVE NEGATIVE mg/dL   Protein, ur 30 (A) NEGATIVE mg/dL   Nitrite NEGATIVE NEGATIVE   Leukocytes, UA NEGATIVE NEGATIVE   RBC / HPF TOO NUMEROUS TO COUNT 0 - 5 RBC/hpf   WBC, UA 0-5 0 - 5 WBC/hpf   Bacteria, UA NONE SEEN NONE SEEN   Squamous Epithelial / LPF 0-5 (A) NONE SEEN   Mucus PRESENT       Assessment & Plan:   There are no diagnoses linked to this encounter.   Current Outpatient Prescriptions:  .  ALPRAZolam (XANAX) 0.25 MG tablet, Take 0.25 mg by mouth as needed. , Disp: , Rfl:  .  aspirin 325 MG tablet, Take 325 mg by mouth daily., Disp: , Rfl:  .  buPROPion (WELLBUTRIN SR) 150 MG 12 hr tablet, Take 1 tablet (150 mg total) by mouth daily., Disp: 90 tablet, Rfl: 0 .  Cholecalciferol 2000 units CAPS, Take 1 capsule (2,000 Units total) by mouth daily., Disp: 30 each, Rfl:  .  clopidogrel (PLAVIX) 75 MG tablet, Take 1 tablet (75 mg total) by mouth daily., Disp: 90 tablet, Rfl: 1 .  Coenzyme Q10 (CO Q10) 100 MG CAPS, Take 1 capsule by mouth daily., Disp: , Rfl:  .  DULoxetine (CYMBALTA) 30 MG capsule, Take 2 capsules (60 mg total) by mouth daily., Disp: 180 capsule, Rfl: 1 .  losartan-hydrochlorothiazide (HYZAAR) 100-25 MG tablet, Take 1 tablet by mouth daily., Disp: 90 tablet, Rfl: 1 .  metFORMIN (GLUCOPHAGE) 500 MG tablet, Take 1 tablet (500 mg total) by mouth 2 (two) times daily with a meal., Disp: 60 tablet, Rfl: 0 .  metoprolol tartrate (LOPRESSOR) 25 MG tablet, Take 1 tablet (25 mg total) by mouth 2 (two) times daily., Disp: 180 tablet, Rfl: 1 .   nitroGLYCERIN (NITROSTAT) 0.4 MG SL tablet, 1 TABLET UNDER TONGUE EVERY 5 MINUTES UP TO 3 TIMES FOR CHEST PAIN THEN CALL DR IF NO RELIEF, Disp: 25 tablet, Rfl: 3 .  Omega-3 Fatty Acids (FISH OIL) 1000 MG CAPS, 1 capsule once. Take 1  Tabs qd, Disp: , Rfl:  .  rosuvastatin (CRESTOR) 10 MG tablet, Take 1 tablet (10 mg total) by mouth daily., Disp: 90 tablet, Rfl: 1 .  traZODone (DESYREL) 100 MG tablet, Take 0.5-1 tablets (50-100 mg total) by mouth at bedtime., Disp: 90 tablet, Rfl: 3 Continue all other maintenance medications as listed above.  Follow up plan: Recheck 1 month  Educational handout given for Sour John PA-C [place] [address]  [place],  [address] [phone]   04/01/2017, 4:06 PM

## 2017-04-13 ENCOUNTER — Other Ambulatory Visit: Payer: Self-pay | Admitting: Physician Assistant

## 2017-04-13 MED ORDER — LIRAGLUTIDE -WEIGHT MANAGEMENT 18 MG/3ML ~~LOC~~ SOPN: 3.0000 mg | PEN_INJECTOR | Freq: Every day | SUBCUTANEOUS | 1 refills | Status: DC

## 2017-04-14 MED ORDER — LIRAGLUTIDE -WEIGHT MANAGEMENT 18 MG/3ML ~~LOC~~ SOPN: 3.0000 mg | PEN_INJECTOR | Freq: Every day | SUBCUTANEOUS | 1 refills | Status: DC

## 2017-04-26 ENCOUNTER — Other Ambulatory Visit: Payer: Self-pay

## 2017-04-26 DIAGNOSIS — Z1283 Encounter for screening for malignant neoplasm of skin: Secondary | ICD-10-CM

## 2017-05-04 ENCOUNTER — Ambulatory Visit (INDEPENDENT_AMBULATORY_CARE_PROVIDER_SITE_OTHER): Payer: 59 | Admitting: Physician Assistant

## 2017-05-04 ENCOUNTER — Encounter: Payer: Self-pay | Admitting: Physician Assistant

## 2017-05-04 VITALS — BP 115/63 | HR 74 | Temp 97.1°F | Ht 63.0 in | Wt 185.0 lb

## 2017-05-04 DIAGNOSIS — L570 Actinic keratosis: Secondary | ICD-10-CM | POA: Diagnosis not present

## 2017-05-04 DIAGNOSIS — Z9989 Dependence on other enabling machines and devices: Secondary | ICD-10-CM

## 2017-05-04 DIAGNOSIS — G4733 Obstructive sleep apnea (adult) (pediatric): Secondary | ICD-10-CM

## 2017-05-04 NOTE — Patient Instructions (Signed)
In a few days you may receive a survey in the mail or online from [employer] regarding your visit with us today. Please take a moment to fill this out. Your feedback is very important to our whole office. It can help us better understand your needs as well as improve your experience and satisfaction. Thank you for taking your time to complete it. We care about you.  Malkie Wille, PA-C  

## 2017-05-04 NOTE — Progress Notes (Addendum)
BP 115/63   Pulse 74   Temp (!) 97.1 F (36.2 C) (Oral)   Ht 5\' 3"  (1.6 m)   Wt 185 lb (83.9 kg)   LMP 12/31/2013   BMI 32.77 kg/m    Subjective:    Patient ID: Heather Lam, female    DOB: 27-May-1961, 56 y.o.   MRN: [id number]  HPI: Heather Lam is a 56 y.o. female presenting on 05/04/2017 for freeze spot on back  Patient is here for 2 areas on her back that are irritating. She has had actinic keratoses in the past.  Patient also has sleep apnea. She is currently using CPAP therapy she is compliant and benefiting from CPAP therapy. She needs to continue using this. Overall she is feeling good with her sleep and feels refreshed in the mornings.  Relevant past medical, surgical, family and social history reviewed and updated as indicated. Allergies and medications reviewed and updated.  Past Medical History:  Diagnosis Date  . Anxiety   . Back pain   . CAD (coronary artery disease)   . Chest pain   . Complication of anesthesia   . Constipation   . Diverticulitis   . Diverticulosis   . Heart attack ([place]) 2010  . Hyperlipemia   . Hypertension   . Joint pain   . PONV (postoperative nausea and vomiting)   . Skin cancer   . Sleep apnea    wears CPAP nightly  . Vitamin D deficiency     Past Surgical History:  Procedure Laterality Date  . APPENDECTOMY    . BACK SURGERY    . CARPAL TUNNEL RELEASE Right   . CARPAL TUNNEL RELEASE Left 08/13/2016   Procedure: LEFT CARPAL TUNNEL RELEASE;  Surgeon: Roseanne Kaufman, MD;  Location: [facility];  Service: Orthopedics;  Laterality: Left;  . CORONARY STENT PLACEMENT    . ENDOMETRIAL ABLATION  2005  . MOHS SURGERY    . TUBAL LIGATION  1991    Review of Systems  Constitutional: Negative.  Negative for activity change, fatigue and fever.  HENT: Negative.   Eyes: Negative.   Respiratory: Negative.  Negative for cough.   Cardiovascular: Negative.  Negative for chest pain.  Gastrointestinal: Negative.  Negative  for abdominal pain.  Endocrine: Negative.   Genitourinary: Negative.  Negative for dysuria.  Musculoskeletal: Negative.   Skin: Positive for color change.  Neurological: Negative.     Allergies as of 05/04/2017   No Known Allergies     Medication List       Accurate as of 05/04/17  3:51 PM. Always use your most recent med list.          ALPRAZolam 0.25 MG tablet Commonly known as:  XANAX Take 0.25 mg by mouth as needed.   aspirin 325 MG tablet Take 325 mg by mouth daily.   buPROPion 150 MG 12 hr tablet Commonly known as:  WELLBUTRIN SR Take 1 tablet (150 mg total) by mouth daily.   Cholecalciferol 2000 units Caps Take 1 capsule (2,000 Units total) by mouth daily.   clopidogrel 75 MG tablet Commonly known as:  PLAVIX Take 1 tablet (75 mg total) by mouth daily.   Co Q10 100 MG Caps Take 1 capsule by mouth daily.   DULoxetine 30 MG capsule Commonly known as:  CYMBALTA Take 2 capsules (60 mg total) by mouth daily.   Fish Oil 1000 MG Caps 1 capsule once. Take 1  Tabs qd   Liraglutide -Weight  Management 18 MG/3ML Sopn Commonly known as:  SAXENDA Inject 3 mg into the skin daily.   losartan-hydrochlorothiazide 100-25 MG tablet Commonly known as:  HYZAAR Take 1 tablet by mouth daily.   metFORMIN 500 MG tablet Commonly known as:  GLUCOPHAGE Take 1 tablet (500 mg total) by mouth 2 (two) times daily with a meal.   metoprolol tartrate 25 MG tablet Commonly known as:  LOPRESSOR Take 1 tablet (25 mg total) by mouth 2 (two) times daily.   nitroGLYCERIN 0.4 MG SL tablet Commonly known as:  NITROSTAT 1 TABLET UNDER TONGUE EVERY 5 MINUTES UP TO 3 TIMES FOR CHEST PAIN THEN CALL DR IF NO RELIEF   rosuvastatin 10 MG tablet Commonly known as:  CRESTOR Take 1 tablet (10 mg total) by mouth daily.   traZODone 100 MG tablet Commonly known as:  DESYREL Take 0.5-1 tablets (50-100 mg total) by mouth at bedtime.          Objective:    BP 115/63   Pulse 74   Temp (!)  97.1 F (36.2 C) (Oral)   Ht 5\' 3"  (1.6 m)   Wt 185 lb (83.9 kg)   LMP 12/31/2013   BMI 32.77 kg/m   No Known Allergies  Physical Exam  Constitutional: She is oriented to person, place, and time. She appears well-developed and well-nourished.  HENT:  Head: Normocephalic and atraumatic.  Eyes: Pupils are equal, round, and reactive to light. Conjunctivae and EOM are normal.  Cardiovascular: Normal rate, regular rhythm, normal heart sounds and intact distal pulses.   Pulmonary/Chest: Effort normal and breath sounds normal.  Abdominal: Soft. Bowel sounds are normal.  Neurological: She is alert and oriented to person, place, and time. She has normal reflexes.  Skin: Skin is warm and dry. Lesion and rash noted. No bruising noted. Rash is maculopapular.  Psychiatric: She has a normal mood and affect. Her behavior is normal. Judgment and thought content normal.    Cryotherapy to 2 small lesions on the back. The areas were cleaned, frozen with a cycle of 5 freeze and thaw episodes. Patient tolerated well. Wound care is given.     Assessment & Plan:   1. Actinic keratoses Cryo treatment to 2 small lesion, well tolerated, wound care instructions given  2. OSA on CPAP Patient is compliant and benefiting from CPAP therapy    Current Outpatient Prescriptions:  .  ALPRAZolam (XANAX) 0.25 MG tablet, Take 0.25 mg by mouth as needed. , Disp: , Rfl:  .  aspirin 325 MG tablet, Take 325 mg by mouth daily., Disp: , Rfl:  .  buPROPion (WELLBUTRIN SR) 150 MG 12 hr tablet, Take 1 tablet (150 mg total) by mouth daily., Disp: 90 tablet, Rfl: 0 .  Cholecalciferol 2000 units CAPS, Take 1 capsule (2,000 Units total) by mouth daily., Disp: 30 each, Rfl:  .  clopidogrel (PLAVIX) 75 MG tablet, Take 1 tablet (75 mg total) by mouth daily., Disp: 90 tablet, Rfl: 1 .  Coenzyme Q10 (CO Q10) 100 MG CAPS, Take 1 capsule by mouth daily., Disp: , Rfl:  .  DULoxetine (CYMBALTA) 30 MG capsule, Take 2 capsules (60 mg  total) by mouth daily., Disp: 180 capsule, Rfl: 1 .  Liraglutide -Weight Management (SAXENDA) 18 MG/3ML SOPN, Inject 3 mg into the skin daily., Disp: 15 mL, Rfl: 1 .  losartan-hydrochlorothiazide (HYZAAR) 100-25 MG tablet, Take 1 tablet by mouth daily., Disp: 90 tablet, Rfl: 1 .  metFORMIN (GLUCOPHAGE) 500 MG tablet, Take 1 tablet (500  mg total) by mouth 2 (two) times daily with a meal., Disp: 60 tablet, Rfl: 0 .  metoprolol tartrate (LOPRESSOR) 25 MG tablet, Take 1 tablet (25 mg total) by mouth 2 (two) times daily., Disp: 180 tablet, Rfl: 1 .  nitroGLYCERIN (NITROSTAT) 0.4 MG SL tablet, 1 TABLET UNDER TONGUE EVERY 5 MINUTES UP TO 3 TIMES FOR CHEST PAIN THEN CALL DR IF NO RELIEF, Disp: 25 tablet, Rfl: 3 .  Omega-3 Fatty Acids (FISH OIL) 1000 MG CAPS, 1 capsule once. Take 1  Tabs qd, Disp: , Rfl:  .  rosuvastatin (CRESTOR) 10 MG tablet, Take 1 tablet (10 mg total) by mouth daily., Disp: 90 tablet, Rfl: 1 .  traZODone (DESYREL) 100 MG tablet, Take 0.5-1 tablets (50-100 mg total) by mouth at bedtime., Disp: 90 tablet, Rfl: 3 Continue all other maintenance medications as listed above.  Follow up plan: No Follow-up on file.  Educational handout given for [place] PA-C [facility] [address]  [place], [place] [address] [phone]   05/04/2017, 3:51 PM

## 2017-05-17 ENCOUNTER — Other Ambulatory Visit: Payer: Self-pay | Admitting: *Deleted

## 2017-05-17 MED ORDER — CIPROFLOXACIN HCL 500 MG PO TABS: 500.0000 mg | ORAL_TABLET | Freq: Two times a day (BID) | ORAL | 0 refills | Status: DC

## 2017-05-17 MED ORDER — INSULIN PEN NEEDLE 32G X 4 MM MISC: 3 refills | Status: DC

## 2017-05-17 NOTE — Progress Notes (Signed)
cipro

## 2017-05-23 ENCOUNTER — Other Ambulatory Visit: Payer: Self-pay | Admitting: *Deleted

## 2017-05-23 MED ORDER — NITROFURANTOIN MONOHYD MACRO 100 MG PO CAPS: 100.0000 mg | ORAL_CAPSULE | Freq: Two times a day (BID) | ORAL | 0 refills | Status: DC

## 2017-05-23 NOTE — Progress Notes (Signed)
Ok to send in Lamar per [place].

## 2017-05-30 ENCOUNTER — Other Ambulatory Visit: Payer: Self-pay | Admitting: *Deleted

## 2017-05-30 DIAGNOSIS — F3289 Other specified depressive episodes: Secondary | ICD-10-CM

## 2017-05-30 MED ORDER — BUPROPION HCL ER (SR) 150 MG PO TB12: 150.0000 mg | ORAL_TABLET | Freq: Every day | ORAL | 0 refills | Status: DC

## 2017-05-30 MED FILL — BUPROPION SR 150 MG TABLET: 150 | 90 days supply | Qty: 90 | Fill #0

## 2017-06-03 DIAGNOSIS — N200 Calculus of kidney: Secondary | ICD-10-CM | POA: Diagnosis not present

## 2017-06-03 DIAGNOSIS — N281 Cyst of kidney, acquired: Secondary | ICD-10-CM | POA: Diagnosis not present

## 2017-06-03 DIAGNOSIS — R310 Gross hematuria: Secondary | ICD-10-CM | POA: Diagnosis not present

## 2017-06-06 ENCOUNTER — Other Ambulatory Visit: Payer: Self-pay | Admitting: Urology

## 2017-06-07 DIAGNOSIS — L821 Other seborrheic keratosis: Secondary | ICD-10-CM | POA: Diagnosis not present

## 2017-06-07 DIAGNOSIS — L720 Epidermal cyst: Secondary | ICD-10-CM | POA: Diagnosis not present

## 2017-06-07 DIAGNOSIS — D229 Melanocytic nevi, unspecified: Secondary | ICD-10-CM | POA: Diagnosis not present

## 2017-06-08 ENCOUNTER — Telehealth: Payer: Self-pay

## 2017-06-08 ENCOUNTER — Other Ambulatory Visit: Payer: Self-pay

## 2017-06-08 MED ORDER — ICOSAPENT ETHYL 1 G PO CAPS: 2.0000 | ORAL_CAPSULE | Freq: Two times a day (BID) | ORAL | 2 refills | Status: DC

## 2017-06-08 MED FILL — VASCEPA 1 GM CAPSULE: 1 | 90 days supply | Qty: 360 | Fill #0

## 2017-06-08 NOTE — Telephone Encounter (Signed)
Patient is taking OTC fish oil daily and is interested in changing to [place] instead.  If you are agreeable, please send prescription to [facility] and advise patient.

## 2017-06-08 NOTE — Telephone Encounter (Signed)
Patient aware.

## 2017-06-08 NOTE — Telephone Encounter (Signed)
Yes, the change has been made.  There is a copay card for it.

## 2017-06-09 ENCOUNTER — Other Ambulatory Visit: Payer: 59

## 2017-06-09 DIAGNOSIS — R319 Hematuria, unspecified: Secondary | ICD-10-CM

## 2017-06-10 LAB — URINE CULTURE

## 2017-06-12 MED FILL — METOPROLOL TARTRATE 25 MG T: 25 | 90 days supply | Qty: 180 | Fill #1

## 2017-06-13 MED FILL — ROSUVASTATIN CALCIUM 10 MG: 10 | 90 days supply | Qty: 90 | Fill #1

## 2017-06-20 ENCOUNTER — Encounter (HOSPITAL_COMMUNITY): Payer: Self-pay | Admitting: General Practice

## 2017-06-20 ENCOUNTER — Other Ambulatory Visit: Payer: Self-pay | Admitting: *Deleted

## 2017-06-20 MED ORDER — LOSARTAN POTASSIUM-HCTZ 100-25 MG PO TABS: 1.0000 | ORAL_TABLET | Freq: Every day | ORAL | 1 refills | Status: DC

## 2017-06-20 MED ORDER — DULOXETINE HCL 30 MG PO CPEP: 60.0000 mg | ORAL_CAPSULE | Freq: Every day | ORAL | 1 refills | Status: DC

## 2017-06-20 MED FILL — CLOPIDOGREL 75 MG TABLET: 75 | 90 days supply | Qty: 90 | Fill #2

## 2017-06-20 MED FILL — LOSARTAN-HCTZ 100-25 MG TAB: 100-25 | 90 days supply | Qty: 90 | Fill #0

## 2017-06-20 MED FILL — DULoxetine HCL 30 MG CPEP: 30 | 90 days supply | Qty: 180 | Fill #0

## 2017-06-26 NOTE — H&P (Signed)
H&P  Chief Complaint: Righ kidney stone  History of Present Illness: Heather Lam is a 56 y.o. year old female who presents for ESL management of a 9 mm right renal pelvic stone.She has been intermittently symptomatic with flank pain. SSD is 12 cm, HUs 990.  Past Medical History:  Diagnosis Date  . Anxiety   . Back pain   . CAD (coronary artery disease)   . Chest pain   . Complication of anesthesia   . Constipation   . Diverticulitis   . Diverticulosis   . Heart attack ([place]) 2010  . History of kidney stones   . Hyperlipemia   . Hypertension   . Joint pain   . PONV (postoperative nausea and vomiting)   . Skin cancer    on nose  . Sleep apnea    wears CPAP nightly  . Vitamin D deficiency     Past Surgical History:  Procedure Laterality Date  . APPENDECTOMY    . BACK SURGERY    . CARPAL TUNNEL RELEASE Right   . CARPAL TUNNEL RELEASE Left 08/13/2016   Procedure: LEFT CARPAL TUNNEL RELEASE;  Surgeon: Roseanne Kaufman, MD;  Location: [facility];  Service: Orthopedics;  Laterality: Left;  . CORONARY STENT PLACEMENT     2 stents in the same artery  . ENDOMETRIAL ABLATION  2005  . MOHS SURGERY    . TUBAL LIGATION  1991    Home Medications:  No medications prior to admission.    Allergies: No Known Allergies  Family History  Problem Relation Age of Onset  . Hyperlipidemia Mother   . Hypertension Mother   . Cancer Father        skin  . Hyperlipidemia Father   . Dementia Father   . Arthritis Father   . Hypertension Father   . Sleep apnea Father   . Other Sister        Myelofibrosis  . Arthritis Sister   . Hyperlipidemia Sister   . Arthritis Sister   . Hyperlipidemia Sister   . Diabetes Paternal Grandfather   . Colon cancer Neg Hx     Social History:  reports that she has quit smoking. she has never used smokeless tobacco. She reports that she drinks alcohol. She reports that she does not use drugs.  ROS: A complete review of systems was  performed.  All systems are negative except for pertinent findings as noted.  Physical Exam:  Vital signs in last 24 hours:   General:  Alert and oriented, No acute distress HEENT: Normocephalic, atraumatic Neck: No JVD or lymphadenopathy Cardiovascular: Regular rate and rhythm Lungs: Clear bilaterally Abdomen: Soft, nontender, nondistended, no abdominal masses Back: No CVA tenderness Extremities: No edema Neurologic: Grossly intact  Laboratory Data:  No results found for this or any previous visit (from the past 24 hour(s)). No results found for this or any previous visit (from the past 240 hour(s)). Creatinine: No results for input(s): CREATININE in the last 168 hours.  Radiologic Imaging: No results found.  Impression/Assessment:  9 mm right renal pelvic stone  Plan:  ESWL  [name] 06/26/2017, 6:48 PM  Lillette Boxer. Caldwell Kronenberger MD

## 2017-06-27 ENCOUNTER — Encounter (HOSPITAL_COMMUNITY): Payer: Self-pay | Admitting: *Deleted

## 2017-06-27 ENCOUNTER — Other Ambulatory Visit: Payer: Self-pay

## 2017-06-27 ENCOUNTER — Ambulatory Visit (HOSPITAL_COMMUNITY)
Admission: RE | Admit: 2017-06-27 | Discharge: 2017-06-27 | Disposition: A | Discharge status: Home / Self Care | Payer: 59 | Source: Ambulatory Visit | Attending: Urology | Admitting: Urology

## 2017-06-27 ENCOUNTER — Encounter (HOSPITAL_COMMUNITY)
Admission: RE | Disposition: A | Discharge status: Home / Self Care | Payer: Self-pay | Source: Ambulatory Visit | Attending: Urology

## 2017-06-27 ENCOUNTER — Ambulatory Visit (HOSPITAL_COMMUNITY): Payer: 59

## 2017-06-27 DIAGNOSIS — I2510 Atherosclerotic heart disease of native coronary artery without angina pectoris: Secondary | ICD-10-CM | POA: Insufficient documentation

## 2017-06-27 DIAGNOSIS — G4730 Sleep apnea, unspecified: Secondary | ICD-10-CM | POA: Insufficient documentation

## 2017-06-27 DIAGNOSIS — F419 Anxiety disorder, unspecified: Secondary | ICD-10-CM | POA: Diagnosis not present

## 2017-06-27 DIAGNOSIS — Z85828 Personal history of other malignant neoplasm of skin: Secondary | ICD-10-CM | POA: Insufficient documentation

## 2017-06-27 DIAGNOSIS — N200 Calculus of kidney: Secondary | ICD-10-CM

## 2017-06-27 DIAGNOSIS — Z955 Presence of coronary angioplasty implant and graft: Secondary | ICD-10-CM | POA: Diagnosis not present

## 2017-06-27 DIAGNOSIS — I10 Essential (primary) hypertension: Secondary | ICD-10-CM | POA: Insufficient documentation

## 2017-06-27 DIAGNOSIS — E559 Vitamin D deficiency, unspecified: Secondary | ICD-10-CM | POA: Insufficient documentation

## 2017-06-27 DIAGNOSIS — E785 Hyperlipidemia, unspecified: Secondary | ICD-10-CM | POA: Insufficient documentation

## 2017-06-27 DIAGNOSIS — Z87442 Personal history of urinary calculi: Secondary | ICD-10-CM | POA: Diagnosis not present

## 2017-06-27 DIAGNOSIS — I252 Old myocardial infarction: Secondary | ICD-10-CM | POA: Insufficient documentation

## 2017-06-27 DIAGNOSIS — Z87891 Personal history of nicotine dependence: Secondary | ICD-10-CM | POA: Insufficient documentation

## 2017-06-27 HISTORY — DX: Personal history of urinary calculi: Z87.442

## 2017-06-27 HISTORY — PX: EXTRACORPOREAL SHOCK WAVE LITHOTRIPSY: SHX1557

## 2017-06-27 SURGERY — LITHOTRIPSY, ESWL
Anesthesia: LOCAL | Laterality: Right

## 2017-06-27 MED ORDER — OXYCODONE HCL 5 MG PO TABS: 5.0000 mg | ORAL_TABLET | ORAL | Status: DC | PRN

## 2017-06-27 MED ORDER — SODIUM CHLORIDE 0.9 % IV SOLN
INTRAVENOUS | Status: DC
  Administered 2017-06-27 (×2): via INTRAVENOUS

## 2017-06-27 MED ORDER — OXYCODONE HCL 5 MG PO TABS: 5.0000 mg | ORAL_TABLET | ORAL | 0 refills | Status: DC | PRN

## 2017-06-27 MED ORDER — SODIUM CHLORIDE 0.9% FLUSH: 3.0000 mL | INTRAVENOUS | Status: DC | PRN

## 2017-06-27 MED ORDER — DIPHENHYDRAMINE HCL 25 MG PO CAPS
25.0000 mg | ORAL_CAPSULE | ORAL | Status: AC
  Administered 2017-06-27: 25 mg via ORAL
  Filled 2017-06-27: qty 1

## 2017-06-27 MED ORDER — ACETAMINOPHEN 325 MG PO TABS
650.0000 mg | ORAL_TABLET | ORAL | Status: DC | PRN
  Administered 2017-06-27: 650 mg via ORAL
  Filled 2017-06-27: qty 2

## 2017-06-27 MED ORDER — DIAZEPAM 5 MG PO TABS
10.0000 mg | ORAL_TABLET | ORAL | Status: AC
  Administered 2017-06-27: 10 mg via ORAL
  Filled 2017-06-27: qty 2

## 2017-06-27 MED ORDER — SODIUM CHLORIDE 0.9% FLUSH: 3.0000 mL | Freq: Two times a day (BID) | INTRAVENOUS | Status: DC

## 2017-06-27 MED ORDER — ACETAMINOPHEN 650 MG RE SUPP
650.0000 mg | RECTAL | Status: DC | PRN
  Filled 2017-06-27: qty 1

## 2017-06-27 MED ORDER — CIPROFLOXACIN HCL 500 MG PO TABS
500.0000 mg | ORAL_TABLET | ORAL | Status: AC
  Administered 2017-06-27: 500 mg via ORAL
  Filled 2017-06-27: qty 1

## 2017-06-27 MED ORDER — SODIUM CHLORIDE 0.9 % IV SOLN: 250.0000 mL | INTRAVENOUS | Status: DC | PRN

## 2017-06-27 MED ORDER — KETOROLAC TROMETHAMINE 15 MG/ML IJ SOLN: 15.0000 mg | Freq: Four times a day (QID) | INTRAMUSCULAR | Status: DC

## 2017-06-27 MED FILL — oxyCODONE HCL 5 MG TABS: 5 | 3 days supply | Qty: 15 | Fill #0

## 2017-06-27 SURGICAL SUPPLY — 2 items
COVER SURGICAL LIGHT HANDLE (MISCELLANEOUS) ×2 IMPLANT
TOWEL OR 17X26 10 PK STRL BLUE (TOWEL DISPOSABLE) ×2 IMPLANT

## 2017-06-27 NOTE — Op Note (Signed)
See [facility] OP note scanned into chart. 

## 2017-06-27 NOTE — Discharge Instructions (Signed)
Moderate Conscious Sedation, Adult, Care After °These instructions provide you with information about caring for yourself after your procedure. Your health care provider may also give you more specific instructions. Your treatment has been planned according to current medical practices, but problems sometimes occur. Call your health care provider if you have any problems or questions after your procedure. °What can I expect after the procedure? °After your procedure, it is common: °· To feel sleepy for several hours. °· To feel clumsy and have poor balance for several hours. °· To have poor judgment for several hours. °· To vomit if you eat too soon. ° °Follow these instructions at home: °For at least 24 hours after the procedure: ° °· Do not: °? Participate in activities where you could fall or become injured. °? Drive. °? Use heavy machinery. °? Drink alcohol. °? Take sleeping pills or medicines that cause drowsiness. °? Make important decisions or sign legal documents. °? Take care of children on your own. °· Rest. °Eating and drinking °· Follow the diet recommended by your health care provider. °· If you vomit: °? Drink water, juice, or soup when you can drink without vomiting. °? Make sure you have little or no nausea before eating solid foods. °General instructions °· Have a responsible adult stay with you until you are awake and alert. °· Take over-the-counter and prescription medicines only as told by your health care provider. °· If you smoke, do not smoke without supervision. °· Keep all follow-up visits as told by your health care provider. This is important. °Contact a health care provider if: °· You keep feeling nauseous or you keep vomiting. °· You feel light-headed. °· You develop a rash. °· You have a fever. °Get help right away if: °· You have trouble breathing. °This information is not intended to replace advice given to you by your health care provider. Make sure you discuss any questions you have  with your health care provider. °Document Released: 05/09/2013 Document Revised: 12/22/2015 Document Reviewed: 11/08/2015 °Elsevier Interactive Patient Education © 2018 [employer] °Lithotripsy, Care After °This sheet gives you information about how to care for yourself after your procedure. Your health care provider may also give you more specific instructions. If you have problems or questions, contact your health care provider. °What can I expect after the procedure? °After the procedure, it is common to have: °· Some blood in your urine. This should only last for a few days. °· Soreness in your back, sides, or upper abdomen for a few days. °· Blotches or bruises on your back where the pressure wave entered the skin. °· Pain, discomfort, or nausea when pieces (fragments) of the kidney stone move through the tube that carries urine from the kidney to the bladder (ureter). Stone fragments may pass soon after the procedure, but they may continue to pass for up to 4-8 weeks. °? If you have severe pain or nausea, contact your health care provider. This may be caused by a large stone that was not broken up, and this may mean that you need more treatment. °· Some pain or discomfort during urination. °· Some pain or discomfort in the lower abdomen or (in men) at the base of the penis. ° °Follow these instructions at home: °Medicines °· Take over-the-counter and prescription medicines only as told by your health care provider. °· If you were prescribed an antibiotic medicine, take it as told by your health care provider. Do not stop taking the antibiotic even if you   start to feel better.  Do not drive for 24 hours if you were given a medicine to help you relax (sedative).  Do not drive or use heavy machinery while taking prescription pain medicine. Eating and drinking  Drink enough water and fluids to keep your urine clear or pale yellow. This helps any remaining pieces of the stone to pass. It can also help  prevent new stones from forming.  Eat plenty of fresh fruits and vegetables.  Follow instructions from your health care provider about eating and drinking restrictions. You may be instructed: ? To reduce how much salt (sodium) you eat or drink. Check ingredients and nutrition facts on packaged foods and beverages. ? To reduce how much meat you eat.  Eat the recommended amount of calcium for your age and gender. Ask your health care provider how much calcium you should have. General instructions  Get plenty of rest.  Most people can resume normal activities 1-2 days after the procedure. Ask your health care provider what activities are safe for you.  If directed, strain all urine through the strainer that was provided by your health care provider. ? Keep all fragments for your health care provider to see. Any stones that are found may be sent to a medical lab for examination. The stone may be as small as a grain of salt.  Keep all follow-up visits as told by your health care provider. This is important. Contact a health care provider if:  You have pain that is severe or does not get better with medicine.  You have nausea that is severe or does not go away.  You have blood in your urine longer than your health care provider told you to expect.  You have more blood in your urine.  You have pain during urination that does not go away.  You urinate more frequently than usual and this does not go away.  You develop a rash or any other possible signs of an allergic reaction. Get help right away if:  You have severe pain in your back, sides, or upper abdomen.  You have severe pain while urinating.  Your urine is very dark red.  You have blood in your stool (feces).  You cannot pass any urine at all.  You feel a strong urge to urinate after emptying your bladder.  You have a fever or chills.  You develop shortness of breath, difficulty breathing, or chest pain.  You have  severe nausea that leads to persistent vomiting.  You faint. Summary  After this procedure, it is common to have some pain, discomfort, or nausea when pieces (fragments) of the kidney stone move through the tube that carries urine from the kidney to the bladder (ureter). If this pain or nausea is severe, however, you should contact your health care provider.  Most people can resume normal activities 1-2 days after the procedure. Ask your health care provider what activities are safe for you.  Drink enough water and fluids to keep your urine clear or pale yellow. This helps any remaining pieces of the stone to pass, and it can help prevent new stones from forming.  If directed, strain your urine and keep all fragments for your health care provider to see. Fragments or stones may be as small as a grain of salt.  Get help right away if you have severe pain in your back, sides, or upper abdomen or have severe pain while urinating. This information is not intended to replace advice  given to you by your health care provider. Make sure you discuss any questions you have with your health care provider. Document Released: 08/08/2007 Document Revised: 06/09/2016 Document Reviewed: 06/09/2016 Elsevier Interactive Patient Education  2017 [place] discharge instructions in chart.

## 2017-06-28 ENCOUNTER — Telehealth: Payer: Self-pay | Admitting: *Deleted

## 2017-06-28 MED ORDER — TAMSULOSIN HCL 0.4 MG PO CAPS: 0.8000 mg | ORAL_CAPSULE | Freq: Every day | ORAL | 1 refills | Status: DC

## 2017-06-28 NOTE — Telephone Encounter (Signed)
Had lithotripsy yesterday and is having a lot of pressure in her groin area and feels like she needs to urinate but is having urinary hesitancy. Can you call her in something to help with this to [place].  Thank you

## 2017-06-28 NOTE — Telephone Encounter (Signed)
Patient aware.

## 2017-07-04 ENCOUNTER — Ambulatory Visit: Payer: 59 | Admitting: Interventional Cardiology

## 2017-07-05 ENCOUNTER — Other Ambulatory Visit: Payer: 59

## 2017-07-05 ENCOUNTER — Other Ambulatory Visit: Payer: Self-pay

## 2017-07-05 DIAGNOSIS — E782 Mixed hyperlipidemia: Secondary | ICD-10-CM

## 2017-07-05 DIAGNOSIS — I10 Essential (primary) hypertension: Secondary | ICD-10-CM | POA: Diagnosis not present

## 2017-07-05 LAB — CBC WITH DIFFERENTIAL/PLATELET
Basophils Absolute: 0.1 10*3/uL (ref 0.0–0.2)
Basos: 1 %
EOS (ABSOLUTE): 0.2 10*3/uL (ref 0.0–0.4)
Eos: 2 %
Hematocrit: 39.5 % (ref 34.0–46.6)
Hemoglobin: 13.1 g/dL (ref 11.1–15.9)
Immature Grans (Abs): 0 10*3/uL (ref 0.0–0.1)
Immature Granulocytes: 0 %
Lymphocytes Absolute: 2 10*3/uL (ref 0.7–3.1)
Lymphs: 24 %
MCH: 28 pg (ref 26.6–33.0)
MCHC: 33.2 g/dL (ref 31.5–35.7)
MCV: 84 fL (ref 79–97)
Monocytes Absolute: 0.7 10*3/uL (ref 0.1–0.9)
Monocytes: 8 %
Neutrophils Absolute: 5.4 10*3/uL (ref 1.4–7.0)
Neutrophils: 65 %
Platelets: 259 10*3/uL (ref 150–379)
RBC: 4.68 x10E6/uL (ref 3.77–5.28)
RDW: 14.2 % (ref 12.3–15.4)
WBC: 8.2 10*3/uL (ref 3.4–10.8)

## 2017-07-05 LAB — CMP14+EGFR
ALT: 17 IU/L (ref 0–32)
AST: 15 IU/L (ref 0–40)
Albumin/Globulin Ratio: 1.9 (ref 1.2–2.2)
Albumin: 4.1 g/dL (ref 3.5–5.5)
Alkaline Phosphatase: 65 IU/L (ref 39–117)
BUN/Creatinine Ratio: 19 (ref 9–23)
BUN: 12 mg/dL (ref 6–24)
Bilirubin Total: 0.2 mg/dL (ref 0.0–1.2)
CO2: 23 mmol/L (ref 20–29)
Calcium: 9.1 mg/dL (ref 8.7–10.2)
Chloride: 102 mmol/L (ref 96–106)
Creatinine, Ser: 0.62 mg/dL (ref 0.57–1.00)
GFR calc Af Amer: 117 mL/min/{1.73_m2} (ref >59)
GFR calc non Af Amer: 101 mL/min/{1.73_m2} (ref >59)
Globulin, Total: 2.2 g/dL (ref 1.5–4.5)
Glucose: 89 mg/dL (ref 65–99)
Potassium: 4.4 mmol/L (ref 3.5–5.2)
Sodium: 141 mmol/L (ref 134–144)
Total Protein: 6.3 g/dL (ref 6.0–8.5)

## 2017-07-05 LAB — LIPID PANEL
Chol/HDL Ratio: 3 ratio (ref 0.0–4.4)
Cholesterol, Total: 114 mg/dL (ref 100–199)
HDL: 38 mg/dL — ABNORMAL LOW (ref >39)
LDL Calculated: 51 mg/dL (ref 0–99)
Triglycerides: 127 mg/dL (ref 0–149)
VLDL Cholesterol Cal: 25 mg/dL (ref 5–40)

## 2017-07-05 NOTE — Progress Notes (Signed)
Cardiology Office Note    Date:  07/06/2017   ID:  Heather Lam, DOB 03-14-1961, MRN [id number]  PCP:  Terald Sleeper, PA-C  Cardiologist: Sinclair Grooms, MD   Chief Complaint  Patient presents with  . Coronary Artery Disease    History of Present Illness:  Heather Lam is a 56 y.o. female a hx of coronary artery disease with prior acute inferior infarction 2010 stent to RCA and intermediate stenosis in the mid LAD treated medically.  2012 nuclear study negative for ischemia. Other problems include hyperlipidemia, hypertension, and anxiety disorder.  nuc study in 2016 with EF 74% normal study.  recent lipids LDL 51.  TG at 183    She is doing well.  She has gained some weight.  She denies dyspnea and angina.  There are no medication side effects.  She does not have orthopnea, PND, or edema.  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  I reviewed digital images from her prior angiogram after stenting the right coronary.  There was moderate to severe LAD disease.  Aggressive risk factor modification including smoking cessation hopefully has some regression.  She has been very clinically stable.   Past Medical History:  Diagnosis Date  . Anxiety   . Back pain   . CAD (coronary artery disease)   . Chest pain   . Complication of anesthesia   . Constipation   . Diverticulitis   . Diverticulosis   . Heart attack ([place]) 2010  . History of kidney stones   . Hyperlipemia   . Hypertension   . Joint pain   . PONV (postoperative nausea and vomiting)   . Skin cancer    on nose  . Sleep apnea    wears CPAP nightly  . Vitamin D deficiency     Past Surgical History:  Procedure Laterality Date  . APPENDECTOMY    . BACK SURGERY    . CARPAL TUNNEL RELEASE Right   . CARPAL TUNNEL RELEASE Left 08/13/2016   Procedure: LEFT CARPAL TUNNEL RELEASE;  Surgeon: Roseanne Kaufman, MD;  Location: [facility];  Service: Orthopedics;  Laterality: Left;  . CORONARY  STENT PLACEMENT     2 stents in the same artery  . ENDOMETRIAL ABLATION  2005  . EXTRACORPOREAL SHOCK WAVE LITHOTRIPSY Right 06/27/2017   Procedure: RIGHT EXTRACORPOREAL SHOCK WAVE LITHOTRIPSY (ESWL);  Surgeon: Franchot Gallo, MD;  Location: [facility];  Service: Urology;  Laterality: Right;  . MOHS SURGERY    . TUBAL LIGATION  1991    Current Medications: Outpatient Medications Prior to Visit  Medication Sig Dispense Refill  . ALPRAZolam (XANAX) 0.25 MG tablet Take 0.25 mg by mouth daily as needed for anxiety.    [name] aspirin 325 MG tablet Take 325 mg by mouth daily.    [name] buPROPion (WELLBUTRIN SR) 150 MG 12 hr tablet Take 1 tablet (150 mg total) by mouth daily. 90 tablet 0  . Cholecalciferol 2000 units CAPS Take 1 capsule (2,000 Units total) by mouth daily. 30 each   . clopidogrel (PLAVIX) 75 MG tablet Take 1 tablet (75 mg total) by mouth daily. 90 tablet 1  . Coenzyme Q10 (CO Q10) 100 MG CAPS Take 1 capsule by mouth daily.    . DULoxetine (CYMBALTA) 30 MG capsule Take 2 capsules (60 mg total) daily by mouth. 180 capsule 1  . Icosapent Ethyl (VASCEPA) 1 g CAPS Take 2 capsules 2 (two) times daily by mouth. [place]  capsule 2  . Insulin Pen Needle (NOVOFINE PLUS) 32G X 4 MM MISC Use to administer saxenda daily 100 each 3  . Liraglutide -Weight Management (SAXENDA) 18 MG/3ML SOPN Inject 3 mg into the skin daily. 15 mL 1  . losartan-hydrochlorothiazide (HYZAAR) 100-25 MG tablet Take 1 tablet daily by mouth. 90 tablet 1  . metoprolol tartrate (LOPRESSOR) 25 MG tablet Take 1 tablet (25 mg total) by mouth 2 (two) times daily. 180 tablet 1  . nitroGLYCERIN (NITROSTAT) 0.4 MG SL tablet 1 TABLET UNDER TONGUE EVERY 5 MINUTES UP TO 3 TIMES FOR CHEST PAIN THEN CALL DR IF NO RELIEF 25 tablet 3  . rosuvastatin (CRESTOR) 10 MG tablet Take 1 tablet (10 mg total) by mouth daily. 90 tablet 1  . traZODone (DESYREL) 100 MG tablet Take 50-100 mg by mouth at bedtime as needed for sleep.    [name] ALPRAZolam (XANAX) 0.25 MG  tablet Take 0.25 mg by mouth as needed.     [name] oxyCODONE (ROXICODONE) 5 MG immediate release tablet Take 1 tablet (5 mg total) by mouth every 4 (four) hours as needed for severe pain. (Patient not taking: Reported on 07/06/2017) 15 tablet 0  . tamsulosin (FLOMAX) 0.4 MG CAPS capsule Take 2 capsules (0.8 mg total) by mouth daily. (Patient not taking: Reported on 07/06/2017) 60 capsule 1  . traZODone (DESYREL) 100 MG tablet Take 0.5-1 tablets (50-100 mg total) by mouth at bedtime. 90 tablet 3   No facility-administered medications prior to visit.      Allergies:   Patient has no known allergies.   Social History   Socioeconomic History  . Marital status: Married    Spouse name: None  . Number of children: None  . Years of education: None  . Highest education level: None  Social Needs  . Financial resource strain: None  . Food insecurity - worry: None  . Food insecurity - inability: None  . Transportation needs - medical: None  . Transportation needs - non-medical: None  Occupational History  . Occupation: Optician, dispensing: [employer]  Tobacco Use  . Smoking status: Former Research scientist (life sciences)  . Smokeless tobacco: Never Used  Substance and Sexual Activity  . Alcohol use: Yes    Comment: Rare   . Drug use: No  . Sexual activity: Yes  Other Topics Concern  . None  Social History Narrative   Caffeine daily      Family History:  The patient's family history includes Arthritis in her father, sister, and sister; Cancer in her father; Dementia in her father; Diabetes in her paternal grandfather; Hyperlipidemia in her father, mother, sister, and sister; Hypertension in her father and mother; Other in her sister; Sleep apnea in her father.   ROS:   Please see the history of present illness.    History of kidney stones. All other systems reviewed and are negative.   PHYSICAL EXAM:   VS:  BP (!) 154/76   Pulse 60   Ht 5\' 3"  (1.6 m)   Wt 187 lb 12.8 oz (85.2 kg)   LMP 12/31/2013   BMI 33.27  kg/m    GEN: Well nourished, well developed, in no acute distress  HEENT: normal  Neck: no JVD, carotid bruits, or masses Cardiac: RRR; no murmurs, rubs, or gallops,no edema  Respiratory:  clear to auscultation bilaterally, normal work of breathing GI: soft, nontender, nondistended, + BS MS: no deformity or atrophy  Skin: warm and dry, no rash Neuro:  Alert and  Oriented x 3, Strength and sensation are intact Psych: euthymic mood, full affect  Wt Readings from Last 3 Encounters:  07/06/17 187 lb 12.8 oz (85.2 kg)  06/27/17 183 lb 8 oz (83.2 kg)  05/04/17 185 lb (83.9 kg)      Studies/Labs Reviewed:   EKG:  EKG  Not repeated  Recent Labs: 07/05/2017: ALT 17; BUN 12; Creatinine, Ser 0.62; Hemoglobin 13.1; Platelets 259; Potassium 4.4; Sodium 141   Lipid Panel    Component Value Date/Time   CHOL 114 07/05/2017 0940   TRIG 127 07/05/2017 0940   TRIG 156 (H) 06/06/2013 1147   HDL 38 (L) 07/05/2017 0940   HDL 50 06/06/2013 1147   CHOLHDL 3.0 07/05/2017 0940   CHOLHDL 6.1 10/07/2008 0500   VLDL 26 10/07/2008 0500   LDLCALC 51 07/05/2017 0940   LDLCALC 50 06/06/2013 1147    Additional studies/ records that were reviewed today include:  Cardiac catheterization 2010:  CONCLUSION:  1. Moderate-to-moderately severe proximal LAD in the 50-70% range,      mild-to-moderate circumflex disease is noted.  2. Overall normal LV function.  3. Total occlusion of RCA treated with angioplasty 2.75 x 15 Xience V postdilated to 3 mm    PLAN:  1. Aspirin and Plavix for greater than a year.  2. Consider stress testing in 4-6 months to evaluate the severity of      the LAD.  3. Aggressive statin therapy with Zocor 80 mg per day.  4. Discharge within 48 hours.   ASSESSMENT:    1. CAD S/P percutaneous coronary angioplasty   2. Essential hypertension   3. OSA on CPAP   4. Mixed hyperlipidemia      PLAN:  In order of problems listed above:  1. Is doing well.  No current symptoms  to suggest active ischemia.  I encouraged risk factor modification including A1c less than 6.5, blood pressure control 130/85 mmHg or less, A1c less than 7, and weight loss.  Also encouraged aerobic activity. 2. Monitor blood pressures at home.  Target less than 130/85 mmHg.  Low-salt diet and weight loss are encouraged. 3. Continue CPAP therapy for sleep apnea. 4. Target LDL less than 70 recent laboratory data demonstrates an LDL of 51.  Follow-up in 1 year.  Encouraged aerobic activity.  Notify us if any chest discomfort or unusual shortness of breath.  Medication Adjustments/Labs and Tests Ordered: Current medicines are reviewed at length with the patient today.  Concerns regarding medicines are outlined above.  Medication changes, Labs and Tests ordered today are listed in the Patient Instructions below. Patient Instructions  Medication Instructions:  Your physician recommends that you continue on your current medications as directed. Please refer to the Current Medication list given to you today.   Labwork: None  Testing/Procedures: None  Follow-Up: Your physician wants you to follow-up in: 1 year with Dr. Tamala Julian.  You will receive a reminder letter in the mail two months in advance. If you don't receive a letter, please call our office to schedule the follow-up appointment.  Any Other Special Instructions Will Be Listed Below (If Applicable).  Your physician discussed the importance of regular exercise and recommended that you start or continue a regular exercise program for good health.   Blood pressure goal is 140/90 or less.   If you need a refill on your cardiac medications before your next appointment, please call your pharmacy.      Signed, Sinclair Grooms, MD  07/06/2017 2:23  PM    [facility] [place], [place], [place]  [address] Phone: [phone]; Fax: [phone]

## 2017-07-06 ENCOUNTER — Ambulatory Visit (INDEPENDENT_AMBULATORY_CARE_PROVIDER_SITE_OTHER): Payer: 59 | Admitting: Interventional Cardiology

## 2017-07-06 ENCOUNTER — Encounter: Payer: Self-pay | Admitting: Interventional Cardiology

## 2017-07-06 VITALS — BP 154/76 | HR 60 | Ht 63.0 in | Wt 187.8 lb

## 2017-07-06 DIAGNOSIS — E782 Mixed hyperlipidemia: Secondary | ICD-10-CM | POA: Diagnosis not present

## 2017-07-06 DIAGNOSIS — Z9989 Dependence on other enabling machines and devices: Secondary | ICD-10-CM

## 2017-07-06 DIAGNOSIS — I10 Essential (primary) hypertension: Secondary | ICD-10-CM | POA: Diagnosis not present

## 2017-07-06 DIAGNOSIS — I2510 Atherosclerotic heart disease of native coronary artery without angina pectoris: Secondary | ICD-10-CM | POA: Diagnosis not present

## 2017-07-06 DIAGNOSIS — G4733 Obstructive sleep apnea (adult) (pediatric): Secondary | ICD-10-CM | POA: Diagnosis not present

## 2017-07-06 DIAGNOSIS — Z9861 Coronary angioplasty status: Secondary | ICD-10-CM | POA: Diagnosis not present

## 2017-07-06 NOTE — Patient Instructions (Signed)
Medication Instructions:  Your physician recommends that you continue on your current medications as directed. Please refer to the Current Medication list given to you today.   Labwork: None  Testing/Procedures: None  Follow-Up: Your physician wants you to follow-up in: 1 year with Dr. Tamala Julian.  You will receive a reminder letter in the mail two months in advance. If you don't receive a letter, please call our office to schedule the follow-up appointment.  Any Other Special Instructions Will Be Listed Below (If Applicable).  Your physician discussed the importance of regular exercise and recommended that you start or continue a regular exercise program for good health.   Blood pressure goal is 140/90 or less.   If you need a refill on your cardiac medications before your next appointment, please call your pharmacy.

## 2017-07-07 DIAGNOSIS — G4733 Obstructive sleep apnea (adult) (pediatric): Secondary | ICD-10-CM | POA: Diagnosis not present

## 2017-07-19 ENCOUNTER — Encounter: Payer: Self-pay | Admitting: Physician Assistant

## 2017-07-19 ENCOUNTER — Ambulatory Visit (INDEPENDENT_AMBULATORY_CARE_PROVIDER_SITE_OTHER): Payer: 59 | Admitting: Physician Assistant

## 2017-07-19 VITALS — BP 138/73 | HR 64 | Temp 97.4°F | Ht 63.0 in | Wt 184.0 lb

## 2017-07-19 DIAGNOSIS — J0100 Acute maxillary sinusitis, unspecified: Secondary | ICD-10-CM

## 2017-07-19 MED ORDER — METHYLPREDNISOLONE ACETATE 80 MG/ML IJ SUSP
80.0000 mg | Freq: Once | INTRAMUSCULAR | Status: AC
  Administered 2017-07-19: 80 mg via INTRAMUSCULAR

## 2017-07-19 MED ORDER — AZITHROMYCIN 250 MG PO TABS: ORAL_TABLET | ORAL | 0 refills | Status: DC

## 2017-07-19 NOTE — Progress Notes (Signed)
BP 138/73 (BP Location: Right Arm)   Pulse 64   Temp (!) 97.4 F (36.3 C) (Oral)   Ht 5\' 3"  (1.6 m)   Wt 184 lb (83.5 kg)   LMP 12/31/2013   BMI 32.59 kg/m    Subjective:    Patient ID: Heather Lam, female    DOB: Mar 04, 1961, 56 y.o.   MRN: [id number]  HPI: Heather Lam is a 56 y.o. female presenting on 07/19/2017 for Sinusitis  This patient has had many days of sinus headache and postnasal drainage. There is copious drainage at times. Denies any fever at this time. There has been a history of sinus infections in the past.  No history of sinus surgery. There is cough at night. It has become more prevalent in recent days.  Relevant past medical, surgical, family and social history reviewed and updated as indicated. Allergies and medications reviewed and updated.  Past Medical History:  Diagnosis Date  . Anxiety   . Back pain   . CAD (coronary artery disease)   . Chest pain   . Complication of anesthesia   . Constipation   . Diverticulitis   . Diverticulosis   . Heart attack ([place]) 2010  . History of kidney stones   . Hyperlipemia   . Hypertension   . Joint pain   . PONV (postoperative nausea and vomiting)   . Skin cancer    on nose  . Sleep apnea    wears CPAP nightly  . Vitamin D deficiency     Past Surgical History:  Procedure Laterality Date  . APPENDECTOMY    . BACK SURGERY    . CARPAL TUNNEL RELEASE Right   . CARPAL TUNNEL RELEASE Left 08/13/2016   Procedure: LEFT CARPAL TUNNEL RELEASE;  Surgeon: Roseanne Kaufman, MD;  Location: [facility];  Service: Orthopedics;  Laterality: Left;  . CORONARY STENT PLACEMENT     2 stents in the same artery  . ENDOMETRIAL ABLATION  2005  . EXTRACORPOREAL SHOCK WAVE LITHOTRIPSY Right 06/27/2017   Procedure: RIGHT EXTRACORPOREAL SHOCK WAVE LITHOTRIPSY (ESWL);  Surgeon: Franchot Gallo, MD;  Location: [facility];  Service: Urology;  Laterality: Right;  . MOHS SURGERY    . TUBAL LIGATION  1991    Review of  Systems  Constitutional: Positive for chills, fatigue and fever. Negative for activity change and appetite change.  HENT: Positive for congestion, postnasal drip, sinus pressure, sinus pain and sore throat.   Eyes: Negative.   Respiratory: Negative for cough and wheezing.   Cardiovascular: Negative.  Negative for chest pain, palpitations and leg swelling.  Gastrointestinal: Negative.   Genitourinary: Negative.   Musculoskeletal: Negative.   Skin: Negative.   Neurological: Positive for headaches.    Allergies as of 07/19/2017   No Known Allergies     Medication List        Accurate as of 07/19/17 12:58 PM. Always use your most recent med list.          ALPRAZolam 0.25 MG tablet Commonly known as:  XANAX Take 0.25 mg by mouth daily as needed for anxiety.   aspirin 325 MG tablet Take 325 mg by mouth daily.   azithromycin 250 MG tablet Commonly known as:  ZITHROMAX Take as directe   buPROPion 150 MG 12 hr tablet Commonly known as:  WELLBUTRIN SR Take 1 tablet (150 mg total) by mouth daily.   Cholecalciferol 2000 units Caps Take 1 capsule (2,000 Units total) by mouth daily.  clopidogrel 75 MG tablet Commonly known as:  PLAVIX Take 1 tablet (75 mg total) by mouth daily.   Co Q10 100 MG Caps Take 1 capsule by mouth daily.   DULoxetine 30 MG capsule Commonly known as:  CYMBALTA Take 2 capsules (60 mg total) daily by mouth.   Icosapent Ethyl 1 g Caps Commonly known as:  VASCEPA Take 2 capsules 2 (two) times daily by mouth.   Insulin Pen Needle 32G X 4 MM Misc Commonly known as:  NOVOFINE PLUS Use to administer saxenda daily   Liraglutide -Weight Management 18 MG/3ML Sopn Commonly known as:  SAXENDA Inject 3 mg into the skin daily.   losartan-hydrochlorothiazide 100-25 MG tablet Commonly known as:  HYZAAR Take 1 tablet daily by mouth.   metoprolol tartrate 25 MG tablet Commonly known as:  LOPRESSOR Take 1 tablet (25 mg total) by mouth 2 (two) times  daily.   nitroGLYCERIN 0.4 MG SL tablet Commonly known as:  NITROSTAT 1 TABLET UNDER TONGUE EVERY 5 MINUTES UP TO 3 TIMES FOR CHEST PAIN THEN CALL DR IF NO RELIEF   rosuvastatin 10 MG tablet Commonly known as:  CRESTOR Take 1 tablet (10 mg total) by mouth daily.   traZODone 100 MG tablet Commonly known as:  DESYREL Take 50-100 mg by mouth at bedtime as needed for sleep.          Objective:    BP 138/73 (BP Location: Right Arm)   Pulse 64   Temp (!) 97.4 F (36.3 C) (Oral)   Ht 5\' 3"  (1.6 m)   Wt 184 lb (83.5 kg)   LMP 12/31/2013   BMI 32.59 kg/m   No Known Allergies  Physical Exam  Constitutional: She is oriented to person, place, and time. She appears well-developed and well-nourished.  HENT:  Head: Normocephalic and atraumatic.  Right Ear: Tympanic membrane and external ear normal. No middle ear effusion.  Left Ear: Tympanic membrane and external ear normal.  No middle ear effusion.  Nose: Mucosal edema and rhinorrhea present. Right sinus exhibits no maxillary sinus tenderness. Left sinus exhibits no maxillary sinus tenderness.  Mouth/Throat: Uvula is midline. Posterior oropharyngeal erythema present.  Eyes: Conjunctivae and EOM are normal. Pupils are equal, round, and reactive to light. Right eye exhibits no discharge. Left eye exhibits no discharge.  Neck: Normal range of motion.  Cardiovascular: Normal rate, regular rhythm and normal heart sounds.  Pulmonary/Chest: Effort normal and breath sounds normal. No respiratory distress. She has no wheezes.  Abdominal: Soft.  Lymphadenopathy:    She has no cervical adenopathy.  Neurological: She is alert and oriented to person, place, and time.  Skin: Skin is warm and dry.  Psychiatric: She has a normal mood and affect.  Nursing note and vitals reviewed.       Assessment & Plan:   1. Acute non-recurrent maxillary sinusitis - azithromycin (ZITHROMAX) 250 MG tablet; Take as directe  Dispense: 6 tablet; Refill: 0 -  methylPREDNISolone acetate (DEPO-MEDROL) injection 80 mg    Current Outpatient Medications:  .  ALPRAZolam (XANAX) 0.25 MG tablet, Take 0.25 mg by mouth daily as needed for anxiety., Disp: , Rfl:  .  aspirin 325 MG tablet, Take 325 mg by mouth daily., Disp: , Rfl:  .  azithromycin (ZITHROMAX) 250 MG tablet, Take as directe, Disp: 6 tablet, Rfl: 0 .  buPROPion (WELLBUTRIN SR) 150 MG 12 hr tablet, Take 1 tablet (150 mg total) by mouth daily., Disp: 90 tablet, Rfl: 0 .  Cholecalciferol 2000  units CAPS, Take 1 capsule (2,000 Units total) by mouth daily., Disp: 30 each, Rfl:  .  clopidogrel (PLAVIX) 75 MG tablet, Take 1 tablet (75 mg total) by mouth daily., Disp: 90 tablet, Rfl: 1 .  Coenzyme Q10 (CO Q10) 100 MG CAPS, Take 1 capsule by mouth daily., Disp: , Rfl:  .  DULoxetine (CYMBALTA) 30 MG capsule, Take 2 capsules (60 mg total) daily by mouth., Disp: 180 capsule, Rfl: 1 .  Icosapent Ethyl (VASCEPA) 1 g CAPS, Take 2 capsules 2 (two) times daily by mouth., Disp: 360 capsule, Rfl: 2 .  Insulin Pen Needle (NOVOFINE PLUS) 32G X 4 MM MISC, Use to administer saxenda daily, Disp: 100 each, Rfl: 3 .  Liraglutide -Weight Management (SAXENDA) 18 MG/3ML SOPN, Inject 3 mg into the skin daily., Disp: 15 mL, Rfl: 1 .  losartan-hydrochlorothiazide (HYZAAR) 100-25 MG tablet, Take 1 tablet daily by mouth., Disp: 90 tablet, Rfl: 1 .  metoprolol tartrate (LOPRESSOR) 25 MG tablet, Take 1 tablet (25 mg total) by mouth 2 (two) times daily., Disp: 180 tablet, Rfl: 1 .  nitroGLYCERIN (NITROSTAT) 0.4 MG SL tablet, 1 TABLET UNDER TONGUE EVERY 5 MINUTES UP TO 3 TIMES FOR CHEST PAIN THEN CALL DR IF NO RELIEF, Disp: 25 tablet, Rfl: 3 .  rosuvastatin (CRESTOR) 10 MG tablet, Take 1 tablet (10 mg total) by mouth daily., Disp: 90 tablet, Rfl: 1 .  traZODone (DESYREL) 100 MG tablet, Take 50-100 mg by mouth at bedtime as needed for sleep., Disp: , Rfl:  Continue all other maintenance medications as listed above.  Follow up  plan: No Follow-up on file.  Educational handout given for [place] PA-C [facility] [address]  [place], [place] [address] [phone]   07/19/2017, 12:58 PM

## 2017-07-19 NOTE — Patient Instructions (Signed)
In a few days you may receive a survey in the mail or online from [employer] regarding your visit with us today. Please take a moment to fill this out. Your feedback is very important to our whole office. It can help us better understand your needs as well as improve your experience and satisfaction. Thank you for taking your time to complete it. We care about you.  Annsleigh Dragoo, PA-C  

## 2017-07-22 DIAGNOSIS — R310 Gross hematuria: Secondary | ICD-10-CM | POA: Diagnosis not present

## 2017-07-22 DIAGNOSIS — N200 Calculus of kidney: Secondary | ICD-10-CM | POA: Diagnosis not present

## 2017-07-24 ENCOUNTER — Other Ambulatory Visit: Payer: Self-pay | Admitting: Family Medicine

## 2017-07-24 ENCOUNTER — Encounter: Payer: Self-pay | Admitting: Family Medicine

## 2017-07-24 MED ORDER — CYCLOBENZAPRINE HCL 10 MG PO TABS: 10.0000 mg | ORAL_TABLET | Freq: Three times a day (TID) | ORAL | 2 refills | Status: DC | PRN

## 2017-08-01 ENCOUNTER — Other Ambulatory Visit: Payer: Self-pay | Admitting: Physician Assistant

## 2017-08-01 DIAGNOSIS — N200 Calculus of kidney: Secondary | ICD-10-CM | POA: Diagnosis not present

## 2017-08-01 MED ORDER — NITROGLYCERIN 0.4 MG SL SUBL: SUBLINGUAL_TABLET | SUBLINGUAL | 3 refills | Status: DC

## 2017-09-01 ENCOUNTER — Telehealth: Payer: Self-pay

## 2017-09-01 ENCOUNTER — Other Ambulatory Visit: Payer: Self-pay

## 2017-09-01 DIAGNOSIS — F3289 Other specified depressive episodes: Secondary | ICD-10-CM

## 2017-09-01 MED ORDER — BUPROPION HCL ER (SR) 150 MG PO TB12: 150.0000 mg | ORAL_TABLET | Freq: Every day | ORAL | 0 refills | Status: DC

## 2017-09-01 NOTE — Telephone Encounter (Signed)
Go every other day for a week, then every 3rd day for a week, then off

## 2017-09-01 NOTE — Telephone Encounter (Signed)
Patient would like to go off of Wellbutrin. She was originally put on for cravings while trying to lose weight and now feels like she doesn't need it. How should she taper off of it? Please advise

## 2017-09-01 NOTE — Telephone Encounter (Signed)
Patient notified

## 2017-09-05 MED FILL — traZODone HCL 100 MG TABS: 100 | 90 days supply | Qty: 90 | Fill #2

## 2017-09-16 ENCOUNTER — Other Ambulatory Visit: Payer: Self-pay | Admitting: *Deleted

## 2017-09-16 DIAGNOSIS — F3289 Other specified depressive episodes: Secondary | ICD-10-CM

## 2017-09-16 MED ORDER — BUPROPION HCL ER (SR) 150 MG PO TB12: 150.0000 mg | ORAL_TABLET | Freq: Every day | ORAL | 2 refills | Status: DC

## 2017-09-16 MED FILL — SHIPPING COST: 1 days supply | Qty: 1 | Fill #0

## 2017-09-16 MED FILL — BUPROPION SR 150 MG TABLET: 150 | 90 days supply | Qty: 90 | Fill #0

## 2017-09-21 ENCOUNTER — Other Ambulatory Visit: Payer: Self-pay

## 2017-09-21 MED ORDER — METOPROLOL TARTRATE 25 MG PO TABS: 25.0000 mg | ORAL_TABLET | Freq: Two times a day (BID) | ORAL | 3 refills | Status: DC

## 2017-09-21 MED FILL — CLOPIDOGREL 75 MG TABLET: 75 | 90 days supply | Qty: 90 | Fill #3

## 2017-09-21 MED FILL — DULoxetine HCL 30 MG CPEP: 30 | 90 days supply | Qty: 180 | Fill #1

## 2017-09-21 MED FILL — METOPROLOL TARTRATE 25 MG T: 25 | 90 days supply | Qty: 180 | Fill #0

## 2017-09-21 MED FILL — SHIPPING COST: 1 days supply | Qty: 1 | Fill #1

## 2017-09-21 NOTE — Telephone Encounter (Signed)
Refill metoprolol

## 2017-10-06 DIAGNOSIS — G4733 Obstructive sleep apnea (adult) (pediatric): Secondary | ICD-10-CM | POA: Diagnosis not present

## 2017-11-07 ENCOUNTER — Other Ambulatory Visit: Payer: Self-pay

## 2017-11-07 MED ORDER — LOSARTAN POTASSIUM-HCTZ 100-25 MG PO TABS: 1.0000 | ORAL_TABLET | Freq: Every day | ORAL | 2 refills | Status: DC

## 2017-11-07 MED FILL — VASCEPA 1 GM CAPSULE: 1 | 90 days supply | Qty: 360 | Fill #1

## 2017-11-07 MED FILL — SHIPPING COST: 1 days supply | Qty: 1 | Fill #2

## 2017-11-07 MED FILL — LOSARTAN POTASSIUM-HCTZ 100: 100-25 | 90 days supply | Qty: 90 | Fill #0

## 2017-11-29 ENCOUNTER — Other Ambulatory Visit: Payer: Self-pay | Admitting: Physician Assistant

## 2017-11-29 ENCOUNTER — Other Ambulatory Visit: Payer: Self-pay

## 2017-11-29 MED ORDER — ROSUVASTATIN CALCIUM 10 MG PO TABS: 10.0000 mg | ORAL_TABLET | Freq: Every day | ORAL | 1 refills | Status: DC

## 2017-11-29 MED FILL — ROSUVASTATIN CALCIUM 10 MG: 10 | 90 days supply | Qty: 90 | Fill #0

## 2017-11-29 MED FILL — SHIPPING COST: 1 days supply | Qty: 1 | Fill #3

## 2017-12-16 ENCOUNTER — Other Ambulatory Visit: Payer: Self-pay | Admitting: Physician Assistant

## 2017-12-16 DIAGNOSIS — N281 Cyst of kidney, acquired: Secondary | ICD-10-CM

## 2017-12-28 ENCOUNTER — Ambulatory Visit (HOSPITAL_COMMUNITY)
Admission: RE | Admit: 2017-12-28 | Discharge: 2017-12-28 | Disposition: A | Discharge status: Home / Self Care | Payer: 59 | Source: Ambulatory Visit | Attending: Physician Assistant | Admitting: Physician Assistant

## 2017-12-28 DIAGNOSIS — N200 Calculus of kidney: Secondary | ICD-10-CM | POA: Diagnosis not present

## 2017-12-28 DIAGNOSIS — N281 Cyst of kidney, acquired: Secondary | ICD-10-CM | POA: Diagnosis not present

## 2017-12-29 ENCOUNTER — Other Ambulatory Visit: Payer: Self-pay | Admitting: *Deleted

## 2017-12-29 MED ORDER — CLOPIDOGREL BISULFATE 75 MG PO TABS: 75.0000 mg | ORAL_TABLET | Freq: Every day | ORAL | 1 refills | Status: DC

## 2017-12-29 MED ORDER — DULOXETINE HCL 30 MG PO CPEP: 60.0000 mg | ORAL_CAPSULE | Freq: Every day | ORAL | 1 refills | Status: DC

## 2017-12-29 MED FILL — BUPROPION HCL SR 150 MG TAB: 150 | 90 days supply | Qty: 90 | Fill #1

## 2017-12-29 MED FILL — SHIPPING COST: 1 days supply | Qty: 1 | Fill #4

## 2017-12-29 MED FILL — DULoxetine HCL 30 MG CPEP: 30 | 90 days supply | Qty: 180 | Fill #0

## 2017-12-29 MED FILL — METOPROLOL TARTRATE 25 MG T: 25 | 90 days supply | Qty: 180 | Fill #1

## 2017-12-29 MED FILL — CLOPIDOGREL 75 MG TABLET: 75 | 90 days supply | Qty: 90 | Fill #0

## 2018-01-23 ENCOUNTER — Encounter: Payer: Self-pay | Admitting: Physician Assistant

## 2018-01-23 ENCOUNTER — Ambulatory Visit (INDEPENDENT_AMBULATORY_CARE_PROVIDER_SITE_OTHER): Payer: 59 | Admitting: Physician Assistant

## 2018-01-23 VITALS — BP 130/79 | HR 62 | Ht 63.0 in | Wt 189.2 lb

## 2018-01-23 DIAGNOSIS — Z9861 Coronary angioplasty status: Secondary | ICD-10-CM | POA: Diagnosis not present

## 2018-01-23 DIAGNOSIS — F3289 Other specified depressive episodes: Secondary | ICD-10-CM

## 2018-01-23 DIAGNOSIS — I2510 Atherosclerotic heart disease of native coronary artery without angina pectoris: Secondary | ICD-10-CM

## 2018-01-23 DIAGNOSIS — I10 Essential (primary) hypertension: Secondary | ICD-10-CM

## 2018-01-23 DIAGNOSIS — G4700 Insomnia, unspecified: Secondary | ICD-10-CM

## 2018-01-23 NOTE — Progress Notes (Signed)
BP 130/79   Pulse 62   Ht 5\' 3"  (1.6 m)   Wt 189 lb 3.2 oz (85.8 kg)   LMP 12/31/2013   BMI 33.52 kg/m     Subjective:    Patient ID: Heather Lam, female    DOB: 12-30-1960, 57 y.o.   MRN: [id number]  HPI: Heather Lam is a 57 y.o. female presenting on 01/23/2018 for Depression  This patient comes in for recheck on her chronic medications.  She does have hypertension, CAD and depression.  She reports that overall she is doing fairly well with her medications.  She does not read refills at this time.  She is just having some increased stress due to marital issues.  Her sleep has been quite bad.   Past Medical History:  Diagnosis Date  . Anxiety   . Back pain   . CAD (coronary artery disease)   . Chest pain   . Complication of anesthesia   . Constipation   . Diverticulitis   . Diverticulosis   . Heart attack ([place]) 2010  . History of kidney stones   . Hyperlipemia   . Hypertension   . Joint pain   . PONV (postoperative nausea and vomiting)   . Skin cancer    on nose  . Sleep apnea    wears CPAP nightly  . Vitamin D deficiency    Relevant past medical, surgical, family and social history reviewed and updated as indicated. Interim medical history since our last visit reviewed. Allergies and medications reviewed and updated. DATA REVIEWED: CHART IN EPIC  Family History reviewed for pertinent findings.  Review of Systems  Constitutional: Negative.   HENT: Negative.   Eyes: Negative.   Respiratory: Negative.   Gastrointestinal: Negative.   Genitourinary: Negative.   Psychiatric/Behavioral: Positive for dysphoric mood and sleep disturbance. Negative for agitation, behavioral problems and suicidal ideas. The patient is nervous/anxious.     Allergies as of 01/23/2018   No Known Allergies     Medication List        Accurate as of 01/23/18  5:49 PM. Always use your most recent med list.          ALPRAZolam 0.25 MG tablet Commonly known as:  XANAX Take  0.25 mg by mouth daily as needed for anxiety.   aspirin 325 MG tablet Take 325 mg by mouth daily.   buPROPion 150 MG 12 hr tablet Commonly known as:  WELLBUTRIN SR Take 1 tablet (150 mg total) by mouth daily.   Cholecalciferol 2000 units Caps Take 1 capsule (2,000 Units total) by mouth daily.   clopidogrel 75 MG tablet Commonly known as:  PLAVIX Take 1 tablet (75 mg total) by mouth daily.   Co Q10 100 MG Caps Take 1 capsule by mouth daily.   cyclobenzaprine 10 MG tablet Commonly known as:  FLEXERIL Take 1 tablet (10 mg total) by mouth 3 (three) times daily as needed for muscle spasms.   DULoxetine 30 MG capsule Commonly known as:  CYMBALTA Take 2 capsules (60 mg total) by mouth daily.   Icosapent Ethyl 1 g Caps Commonly known as:  VASCEPA Take 2 capsules 2 (two) times daily by mouth.   Insulin Pen Needle 32G X 4 MM Misc Commonly known as:  NOVOFINE PLUS Use to administer saxenda daily   Liraglutide -Weight Management 18 MG/3ML Sopn Commonly known as:  SAXENDA Inject 3 mg into the skin daily.   losartan-hydrochlorothiazide 100-25 MG tablet Commonly known as:  HYZAAR Take 1 tablet by mouth daily.   metoprolol tartrate 25 MG tablet Commonly known as:  LOPRESSOR Take 1 tablet (25 mg total) by mouth 2 (two) times daily.   nitroGLYCERIN 0.4 MG SL tablet Commonly known as:  NITROSTAT 1 TABLET UNDER TONGUE EVERY 5 MINUTES UP TO 3 TIMES FOR CHEST PAIN THEN CALL DR IF NO RELIEF   rosuvastatin 10 MG tablet Commonly known as:  CRESTOR Take 1 tablet (10 mg total) by mouth daily.   traZODone 100 MG tablet Commonly known as:  DESYREL Take 50-100 mg by mouth at bedtime as needed for sleep.          Objective:    BP 130/79   Pulse 62   Ht 5\' 3"  (1.6 m)   Wt 189 lb 3.2 oz (85.8 kg)   LMP 12/31/2013   BMI 33.52 kg/m    No Known Allergies  Wt Readings from Last 3 Encounters:  01/23/18 189 lb 3.2 oz (85.8 kg)  07/19/17 184 lb (83.5 kg)  07/06/17 187 lb 12.8 oz  (85.2 kg)    Physical Exam  Constitutional: She is oriented to person, place, and time. She appears well-developed and well-nourished.  HENT:  Head: Normocephalic and atraumatic.  Eyes: Pupils are equal, round, and reactive to light. Conjunctivae and EOM are normal.  Cardiovascular: Normal rate, regular rhythm, normal heart sounds and intact distal pulses.  Pulmonary/Chest: Effort normal and breath sounds normal.  Abdominal: Soft. Bowel sounds are normal.  Neurological: She is alert and oriented to person, place, and time. She has normal reflexes.  Skin: Skin is warm and dry. No rash noted.  Psychiatric: She has a normal mood and affect. Her behavior is normal. Judgment and thought content normal.        Assessment & Plan:   1. CAD S/P percutaneous coronary angioplasty Low fat, low cholesterol, low sodium Continue maintenance medicaitons  2. Insomnia, unspecified type Continue meds  3. Other depression Continue meds  4. Essential hypertension Low fat, low cholesterol, low sodium Continue maintenance medicaitons   Continue all other maintenance medications as listed above.  Follow up plan: No follow-ups on file.  Educational handout given for [place] PA-C [place] [address]  [place], [place] [address] [phone]   01/23/2018, 5:49 PM

## 2018-01-30 MED FILL — LOSARTAN-HCTZ 100-25 MG TAB: 100-25 | 90 days supply | Qty: 90 | Fill #1

## 2018-02-06 MED FILL — SHIPPING COST: 1 days supply | Qty: 1 | Fill #5

## 2018-02-23 ENCOUNTER — Other Ambulatory Visit: Payer: Self-pay | Admitting: Physician Assistant

## 2018-02-23 ENCOUNTER — Telehealth: Payer: Self-pay | Admitting: *Deleted

## 2018-02-23 MED ORDER — NITROGLYCERIN 0.4 MG SL SUBL: SUBLINGUAL_TABLET | SUBLINGUAL | 3 refills | Status: DC

## 2018-02-23 MED ORDER — GABAPENTIN 100 MG PO CAPS: 100.0000 mg | ORAL_CAPSULE | Freq: Every day | ORAL | 3 refills | Status: DC

## 2018-02-23 MED FILL — SHIPPING COST: 1 days supply | Qty: 1 | Fill #6

## 2018-02-23 MED FILL — GABAPENTIN 100 MG CAPSULE: 100 | 30 days supply | Qty: 90 | Fill #0

## 2018-02-23 NOTE — Telephone Encounter (Signed)
Patient aware.

## 2018-02-23 NOTE — Telephone Encounter (Signed)
Patient is wanting to know if you can change her from trazodone to gabapentin? What is your opinion?

## 2018-02-23 NOTE — Telephone Encounter (Signed)
Sent to [facility]

## 2018-03-10 ENCOUNTER — Telehealth: Payer: Self-pay | Admitting: *Deleted

## 2018-03-10 ENCOUNTER — Other Ambulatory Visit: Payer: Self-pay | Admitting: Physician Assistant

## 2018-03-10 MED ORDER — TRAZODONE HCL 50 MG PO TABS: 50.0000 mg | ORAL_TABLET | Freq: Every evening | ORAL | 3 refills | Status: DC | PRN

## 2018-03-10 NOTE — Telephone Encounter (Signed)
Patient aware that rx sent to pharmacy. 

## 2018-03-10 NOTE — Telephone Encounter (Signed)
Patient wants to know if you can send in trazodone to the pharmacy. She would like to know if she can try to help with sleep. Gabapentin has not helped

## 2018-03-10 NOTE — Telephone Encounter (Signed)
sent 

## 2018-03-28 MED FILL — DULoxetine HCL 30 MG CPEP: 30 | 90 days supply | Qty: 180 | Fill #1

## 2018-03-28 MED FILL — ROSUVASTATIN CALCIUM 10 MG: 10 | 90 days supply | Qty: 90 | Fill #1

## 2018-03-28 MED FILL — CLOPIDOGREL 75 MG TABLET: 75 | 90 days supply | Qty: 90 | Fill #1

## 2018-03-28 MED FILL — VASCEPA 1 GM CAPSULE: 1 | 90 days supply | Qty: 360 | Fill #2

## 2018-04-07 ENCOUNTER — Ambulatory Visit: Payer: 59 | Admitting: Family Medicine

## 2018-04-24 MED FILL — SHIPPING COST: 1 days supply | Qty: 1 | Fill #7

## 2018-04-24 MED FILL — METOPROLOL TARTRATE 25 MG T: 25 | 90 days supply | Qty: 180 | Fill #2

## 2018-04-24 MED FILL — LOSARTAN-HCTZ 100-25 MG TAB: 100-25 | 90 days supply | Qty: 90 | Fill #2

## 2018-05-03 ENCOUNTER — Telehealth: Payer: Self-pay | Admitting: *Deleted

## 2018-05-03 ENCOUNTER — Other Ambulatory Visit: Payer: Self-pay | Admitting: Physician Assistant

## 2018-05-03 MED ORDER — TRAZODONE HCL 50 MG PO TABS: 50.0000 mg | ORAL_TABLET | Freq: Every evening | ORAL | 3 refills | Status: DC | PRN

## 2018-05-03 MED FILL — SHIPPING COST: 1 days supply | Qty: 1 | Fill #8

## 2018-05-03 MED FILL — traZODone HCL 50 MG TABS: 50 | 45 days supply | Qty: 90 | Fill #0

## 2018-05-03 NOTE — Telephone Encounter (Signed)
Patient notified

## 2018-05-03 NOTE — Telephone Encounter (Signed)
Sent to pharmacy 

## 2018-05-03 NOTE — Telephone Encounter (Signed)
Patient states that the gabapentin is making her real drowsy in the mornings. She would like to go back to trazodone to [facility]

## 2018-05-08 ENCOUNTER — Encounter: Payer: Self-pay | Admitting: Interventional Cardiology

## 2018-05-30 ENCOUNTER — Other Ambulatory Visit: Payer: Self-pay | Admitting: *Deleted

## 2018-05-30 MED ORDER — ALPRAZOLAM 0.25 MG PO TABS: 0.2500 mg | ORAL_TABLET | Freq: Every day | ORAL | 0 refills | Status: DC | PRN

## 2018-05-31 DIAGNOSIS — Z1212 Encounter for screening for malignant neoplasm of rectum: Secondary | ICD-10-CM | POA: Diagnosis not present

## 2018-05-31 DIAGNOSIS — Z6836 Body mass index (BMI) 36.0-36.9, adult: Secondary | ICD-10-CM | POA: Diagnosis not present

## 2018-05-31 DIAGNOSIS — Z1231 Encounter for screening mammogram for malignant neoplasm of breast: Secondary | ICD-10-CM | POA: Diagnosis not present

## 2018-05-31 DIAGNOSIS — Z01419 Encounter for gynecological examination (general) (routine) without abnormal findings: Secondary | ICD-10-CM | POA: Diagnosis not present

## 2018-06-02 ENCOUNTER — Other Ambulatory Visit: Payer: 59

## 2018-06-02 ENCOUNTER — Other Ambulatory Visit: Payer: Self-pay | Admitting: Physician Assistant

## 2018-06-02 DIAGNOSIS — E782 Mixed hyperlipidemia: Secondary | ICD-10-CM

## 2018-06-02 DIAGNOSIS — I252 Old myocardial infarction: Secondary | ICD-10-CM

## 2018-06-02 DIAGNOSIS — R7303 Prediabetes: Secondary | ICD-10-CM

## 2018-06-02 LAB — BAYER DCA HB A1C WAIVED: HB A1C (BAYER DCA - WAIVED): 5.7 % (ref <7.0)

## 2018-06-03 LAB — CMP14+EGFR
ALT: 18 IU/L (ref 0–32)
AST: 18 IU/L (ref 0–40)
Albumin/Globulin Ratio: 1.6 (ref 1.2–2.2)
Albumin: 4.2 g/dL (ref 3.5–5.5)
Alkaline Phosphatase: 75 IU/L (ref 39–117)
BUN/Creatinine Ratio: 28 — ABNORMAL HIGH (ref 9–23)
BUN: 17 mg/dL (ref 6–24)
Bilirubin Total: <0.2 mg/dL (ref 0.0–1.2)
CO2: 26 mmol/L (ref 20–29)
Calcium: 9.4 mg/dL (ref 8.7–10.2)
Chloride: 100 mmol/L (ref 96–106)
Creatinine, Ser: 0.61 mg/dL (ref 0.57–1.00)
GFR calc Af Amer: 116 mL/min/{1.73_m2} (ref >59)
GFR calc non Af Amer: 101 mL/min/{1.73_m2} (ref >59)
Globulin, Total: 2.6 g/dL (ref 1.5–4.5)
Glucose: 99 mg/dL (ref 65–99)
Potassium: 3.7 mmol/L (ref 3.5–5.2)
Sodium: 143 mmol/L (ref 134–144)
Total Protein: 6.8 g/dL (ref 6.0–8.5)

## 2018-06-03 LAB — CBC WITH DIFFERENTIAL/PLATELET
Basophils Absolute: 0.1 10*3/uL (ref 0.0–0.2)
Basos: 1 %
EOS (ABSOLUTE): 0.2 10*3/uL (ref 0.0–0.4)
Eos: 2 %
Hematocrit: 42 % (ref 34.0–46.6)
Hemoglobin: 14.1 g/dL (ref 11.1–15.9)
Immature Grans (Abs): 0.1 10*3/uL (ref 0.0–0.1)
Immature Granulocytes: 1 %
Lymphocytes Absolute: 2.7 10*3/uL (ref 0.7–3.1)
Lymphs: 30 %
MCH: 28.7 pg (ref 26.6–33.0)
MCHC: 33.6 g/dL (ref 31.5–35.7)
MCV: 85 fL (ref 79–97)
Monocytes Absolute: 0.6 10*3/uL (ref 0.1–0.9)
Monocytes: 6 %
Neutrophils Absolute: 5.5 10*3/uL (ref 1.4–7.0)
Neutrophils: 60 %
Platelets: 257 10*3/uL (ref 150–450)
RBC: 4.92 x10E6/uL (ref 3.77–5.28)
RDW: 13.5 % (ref 12.3–15.4)
WBC: 9 10*3/uL (ref 3.4–10.8)

## 2018-06-03 LAB — LIPID PANEL
Chol/HDL Ratio: 3.5 ratio (ref 0.0–4.4)
Cholesterol, Total: 126 mg/dL (ref 100–199)
HDL: 36 mg/dL — ABNORMAL LOW (ref >39)
LDL Calculated: 63 mg/dL (ref 0–99)
Triglycerides: 135 mg/dL (ref 0–149)
VLDL Cholesterol Cal: 27 mg/dL (ref 5–40)

## 2018-06-03 LAB — TSH: TSH: 1.56 u[IU]/mL (ref 0.450–4.500)

## 2018-06-04 NOTE — Progress Notes (Signed)
Cardiology Office Note:    Date:  06/05/2018   ID:  Heather Lam, DOB 1961/03/04, MRN [id number]  PCP:  Terald Sleeper, PA-C  Cardiologist:  Sinclair Grooms, MD   Referring MD: Terald Sleeper, PA-C   Chief Complaint  Patient presents with  . Coronary Artery Disease    History of Present Illness:    Heather Lam is a 57 y.o. female with a hx of coronary artery disease with prior acute inferior infarction 2010stent to RCAand intermediate stenosis in the mid LADtreated medically.2012 nuclear study negative for ischemia. Other problems include hyperlipidemia, hypertension, and anxiety disorder. nuc study in 2016 with EF 74% normal study. recentlipids LDL 51. TG at 183   No angina.  Has gained weight.  Not physically active.  No chest discomfort.  No nitroglycerin use.  Working every day without significant problems.  Sleeps well.  Recent lab tests were excellent.  Past Medical History:  Diagnosis Date  . Anxiety   . Back pain   . CAD (coronary artery disease)   . Chest pain   . Complication of anesthesia   . Constipation   . Diverticulitis   . Diverticulosis   . Heart attack ([place]) 2010  . History of kidney stones   . Hyperlipemia   . Hypertension   . Joint pain   . PONV (postoperative nausea and vomiting)   . Skin cancer    on nose  . Sleep apnea    wears CPAP nightly  . Vitamin D deficiency     Past Surgical History:  Procedure Laterality Date  . APPENDECTOMY    . BACK SURGERY    . CARPAL TUNNEL RELEASE Right   . CARPAL TUNNEL RELEASE Left 08/13/2016   Procedure: LEFT CARPAL TUNNEL RELEASE;  Surgeon: Roseanne Kaufman, MD;  Location: [place];  Service: Orthopedics;  Laterality: Left;  . CORONARY STENT PLACEMENT     2 stents in the same artery  . ENDOMETRIAL ABLATION  2005  . EXTRACORPOREAL SHOCK WAVE LITHOTRIPSY Right 06/27/2017   Procedure: RIGHT EXTRACORPOREAL SHOCK WAVE LITHOTRIPSY (ESWL);  Surgeon: Franchot Gallo, MD;   [facility];  Service: Urology;  Laterality: Right;  . MOHS SURGERY    . TUBAL LIGATION  1991    Current Medications: Current Meds  Medication Sig  . ALPRAZolam (XANAX) 0.25 MG tablet Take 1 tablet (0.25 mg total) by mouth daily as needed for anxiety.  [name] aspirin 325 MG tablet Take 325 mg by mouth daily.  . Cholecalciferol 2000 units CAPS Take 1 capsule (2,000 Units total) by mouth daily.  . clopidogrel (PLAVIX) 75 MG tablet Take 1 tablet (75 mg total) by mouth daily.  . Coenzyme Q10 (CO Q10) 100 MG CAPS Take 1 capsule by mouth daily.  . DULoxetine (CYMBALTA) 30 MG capsule Take 2 capsules (60 mg total) by mouth daily.  Vanessa Kick Ethyl (VASCEPA) 1 g CAPS Take 2 capsules 2 (two) times daily by mouth.  . losartan-hydrochlorothiazide (HYZAAR) 100-25 MG tablet Take 1 tablet by mouth daily.  . metoprolol tartrate (LOPRESSOR) 25 MG tablet Take 1 tablet (25 mg total) by mouth 2 (two) times daily.  . nitroGLYCERIN (NITROSTAT) 0.4 MG SL tablet 1 TABLET UNDER TONGUE EVERY 5 MINUTES UP TO 3 TIMES FOR CHEST PAIN THEN CALL DR IF NO RELIEF  . rosuvastatin (CRESTOR) 10 MG tablet Take 1 tablet (10 mg total) by mouth daily.  . traZODone (DESYREL) 50 MG tablet Take 1-2 tablets (50-100 mg  total) by mouth at bedtime as needed for sleep.     Allergies:   Patient has no known allergies.   Social History   Socioeconomic History  . Marital status: Married    Spouse name: Not on file  . Number of children: Not on file  . Years of education: Not on file  . Highest education level: Not on file  Occupational History  . Occupation: Optician, dispensing: [employer]  . Financial resource strain: Not on file  . Food insecurity:    Worry: Not on file    Inability: Not on file  . Transportation needs:    Medical: Not on file    Non-medical: Not on file  Tobacco Use  . Smoking status: Former Research scientist (life sciences)  . Smokeless tobacco: Never Used  Substance and Sexual Activity  . Alcohol use: Yes     Comment: Rare   . Drug use: No  . Sexual activity: Yes  Lifestyle  . Physical activity:    Days per week: Not on file    Minutes per session: Not on file  . Stress: Not on file  Relationships  . Social connections:    Talks on phone: Not on file    Gets together: Not on file    Attends religious service: Not on file    Active member of club or organization: Not on file    Attends meetings of clubs or organizations: Not on file    Relationship status: Not on file  Other Topics Concern  . Not on file  Social History Narrative   Caffeine daily      Family History: The patient's family history includes Arthritis in her father, sister, and sister; Cancer in her father; Dementia in her father; Diabetes in her paternal grandfather; Hyperlipidemia in her father, mother, sister, and sister; Hypertension in her father and mother; Other in her sister; Sleep apnea in her father. There is no history of Colon cancer.  ROS:   Please see the history of present illness.    Anxiety and stress.  Hot flashes.  All other systems reviewed and are negative.  EKGs/Labs/Other Studies Reviewed:    The following studies were reviewed today: Last nuclear stress test 2016  EKG:  EKG is  ordered today.  The ekg ordered today demonstrates normal sinus rhythm with nonspecific T wave flattening.  Recent Labs: 06/02/2018: ALT 18; BUN 17; Creatinine, Ser 0.61; Hemoglobin 14.1; Platelets 257; Potassium 3.7; Sodium 143; TSH 1.560  Recent Lipid Panel    Component Value Date/Time   CHOL 126 06/02/2018 1415   TRIG 135 06/02/2018 1415   TRIG 156 (H) 06/06/2013 1147   HDL 36 (L) 06/02/2018 1415   HDL 50 06/06/2013 1147   CHOLHDL 3.5 06/02/2018 1415   CHOLHDL 6.1 10/07/2008 0500   VLDL 26 10/07/2008 0500   LDLCALC 63 06/02/2018 1415   LDLCALC 50 06/06/2013 1147    Physical Exam:    VS:  BP 138/80   Pulse 71   Ht 5\' 3"  (1.6 m)   Wt 206 lb 9.6 oz (93.7 kg)   LMP 12/31/2013   SpO2 97%   BMI 36.60 kg/m      Wt Readings from Last 3 Encounters:  06/05/18 206 lb 9.6 oz (93.7 kg)  01/23/18 189 lb 3.2 oz (85.8 kg)  07/19/17 184 lb (83.5 kg)     GEN: Obese well nourished, well developed in no acute distress HEENT: Normal NECK: No JVD. LYMPHATICS:  No lymphadenopathy CARDIAC: RRR, no murmur, no gallop, no edema. VASCULAR: 2+ bilateral pulses.  No bruits. RESPIRATORY:  Clear to auscultation without rales, wheezing or rhonchi  ABDOMEN: Soft, non-tender, non-distended, No pulsatile mass, MUSCULOSKELETAL: No deformity  SKIN: Warm and dry NEUROLOGIC:  Alert and oriented x 3 PSYCHIATRIC:  Normal affect   ASSESSMENT:    1. CAD S/P percutaneous coronary angioplasty   2. OSA on CPAP   3. Mixed hyperlipidemia   4. Essential hypertension    PLAN:    In order of problems listed above:  1. Exercise nuclear study.  Discontinue Plavix.  Decrease aspirin to 81 mg/day. 2. Encourage compliance with CPAP. 3. Lipids are at target. 4. Blood pressure target 130/80.  Decrease salt in diet and lose weight recommended.  Overall education and awareness concerning primary/secondary risk prevention was discussed in detail: LDL less than 70, hemoglobin A1c less than 7, blood pressure target less than 130/80 mmHg, >150 minutes of moderate aerobic activity per week, avoidance of smoking, weight control (via diet and exercise), and continued surveillance/management of/for obstructive sleep apnea.   Medication Adjustments/Labs and Tests Ordered: Current medicines are reviewed at length with the patient today.  Concerns regarding medicines are outlined above.  No orders of the defined types were placed in this encounter.  No orders of the defined types were placed in this encounter.   There are no Patient Instructions on file for this visit.   Signed, Sinclair Grooms, MD  06/05/2018 3:16 PM    [place]

## 2018-06-05 ENCOUNTER — Encounter: Payer: Self-pay | Admitting: *Deleted

## 2018-06-05 ENCOUNTER — Encounter: Payer: Self-pay | Admitting: Interventional Cardiology

## 2018-06-05 ENCOUNTER — Ambulatory Visit (INDEPENDENT_AMBULATORY_CARE_PROVIDER_SITE_OTHER): Payer: 59 | Admitting: Interventional Cardiology

## 2018-06-05 VITALS — BP 138/80 | HR 71 | Ht 63.0 in | Wt 206.6 lb

## 2018-06-05 DIAGNOSIS — Z9989 Dependence on other enabling machines and devices: Secondary | ICD-10-CM | POA: Diagnosis not present

## 2018-06-05 DIAGNOSIS — G4733 Obstructive sleep apnea (adult) (pediatric): Secondary | ICD-10-CM

## 2018-06-05 DIAGNOSIS — Z9861 Coronary angioplasty status: Secondary | ICD-10-CM | POA: Diagnosis not present

## 2018-06-05 DIAGNOSIS — E782 Mixed hyperlipidemia: Secondary | ICD-10-CM

## 2018-06-05 DIAGNOSIS — I2510 Atherosclerotic heart disease of native coronary artery without angina pectoris: Secondary | ICD-10-CM

## 2018-06-05 DIAGNOSIS — I10 Essential (primary) hypertension: Secondary | ICD-10-CM

## 2018-06-05 MED ORDER — ASPIRIN EC 81 MG PO TBEC: 81.0000 mg | DELAYED_RELEASE_TABLET | Freq: Every day | ORAL | 3 refills | Status: DC

## 2018-06-05 NOTE — Patient Instructions (Signed)
Medication Instructions:  1) DISCONTINUE Plavix 2) DECREASE Aspirin to 81mg  once daily  If you need a refill on your cardiac medications before your next appointment, please call your pharmacy.   Lab work: None If you have labs (blood work) drawn today and your tests are completely normal, you will receive your results only by: [name] MyChart Message (if you have MyChart) OR . A paper copy in the mail If you have any lab test that is abnormal or we need to change your treatment, we will call you to review the results.  Testing/Procedures: Your physician has requested that you have en exercise stress myoview. For further information please visit [email or web address]. Please follow instruction sheet, as given.   Follow-Up: At [facility], you and your health needs are our priority.  As part of our continuing mission to provide you with exceptional heart care, we have created designated Provider Care Teams.  These Care Teams include your primary Cardiologist (physician) and Advanced Practice Providers (APPs -  Physician Assistants and Nurse Practitioners) who all work together to provide you with the care you need, when you need it. You will need a follow up appointment in 12 months.  Please call our office 2 months in advance to schedule this appointment.  You may see Sinclair Grooms, MD or one of the following Advanced Practice Providers on your designated Care Team:   Truitt Merle, NP Cecilie Kicks, NP . Kathyrn Drown, NP  Any Other Special Instructions Will Be Listed Below (If Applicable).

## 2018-06-07 MED FILL — busPIRone HCL 10 MG TABS: 10 | 90 days supply | Qty: 180 | Fill #0

## 2018-06-07 MED FILL — SHIPPING COST: 1 days supply | Qty: 1 | Fill #9

## 2018-06-20 ENCOUNTER — Telehealth (HOSPITAL_COMMUNITY): Payer: Self-pay | Admitting: *Deleted

## 2018-06-20 NOTE — Telephone Encounter (Signed)
Left message on voicemail per DPR in reference to upcoming appointment scheduled on 06/23/18 at 8 with detailed instructions given per Myocardial Perfusion Study Information Sheet for the test. LM to arrive 15 minutes early, and that it is imperative to arrive on time for appointment to keep from having the test rescheduled. If you need to cancel or reschedule your appointment, please call the office within 24 hours of your appointment. Failure to do so may result in a cancellation of your appointment, and a $50 no show fee. Phone number given for call back for any questions. Mahmoud Blazejewski, Ranae [place]

## 2018-06-23 ENCOUNTER — Ambulatory Visit (HOSPITAL_COMMUNITY): Payer: 59 | Attending: Cardiology

## 2018-06-23 DIAGNOSIS — N750 Cyst of Bartholin's gland: Secondary | ICD-10-CM | POA: Diagnosis not present

## 2018-06-23 DIAGNOSIS — Z9861 Coronary angioplasty status: Secondary | ICD-10-CM | POA: Insufficient documentation

## 2018-06-23 DIAGNOSIS — I2510 Atherosclerotic heart disease of native coronary artery without angina pectoris: Secondary | ICD-10-CM | POA: Diagnosis not present

## 2018-06-23 DIAGNOSIS — I10 Essential (primary) hypertension: Secondary | ICD-10-CM | POA: Diagnosis not present

## 2018-06-23 DIAGNOSIS — N907 Vulvar cyst: Secondary | ICD-10-CM | POA: Diagnosis not present

## 2018-06-23 LAB — MYOCARDIAL PERFUSION IMAGING
LV dias vol: 58 mL (ref 46–106)
LV sys vol: 11 mL
Peak HR: 100 {beats}/min
Rest HR: 81 {beats}/min
SDS: 4
SRS: 1
SSS: 5
TID: 1.09

## 2018-06-23 MED ORDER — TECHNETIUM TC 99M TETROFOSMIN IV KIT
31.4000 | PACK | Freq: Once | INTRAVENOUS | Status: AC | PRN
  Administered 2018-06-23: 31.4 via INTRAVENOUS
  Filled 2018-06-23: qty 32

## 2018-06-23 MED ORDER — REGADENOSON 0.4 MG/5ML IV SOLN
0.4000 mg | Freq: Once | INTRAVENOUS | Status: AC
  Administered 2018-06-23: 0.4 mg via INTRAVENOUS

## 2018-06-23 MED ORDER — TECHNETIUM TC 99M TETROFOSMIN IV KIT
10.2000 | PACK | Freq: Once | INTRAVENOUS | Status: AC | PRN
  Administered 2018-06-23: 10.2 via INTRAVENOUS
  Filled 2018-06-23: qty 11

## 2018-06-26 ENCOUNTER — Telehealth: Payer: Self-pay | Admitting: Interventional Cardiology

## 2018-06-26 NOTE — Telephone Encounter (Signed)
  Patient was returning call regarding stress test results

## 2018-06-26 NOTE — Telephone Encounter (Signed)
Notes recorded by Belva Crome, MD on 06/23/2018 at 4:26 PM EST Let the patient know that the nuclear study reveals normal heart function and no evidence of new blockage. The study is very reassuring. Encourage greater than 150 minutes of moderate aerobic activity per week. A copy will be sent to Terald Sleeper, PA-C  I spoke with Heather Lam and reviewed stress test results with her.  Results sent to Particia Nearing, PA-C

## 2018-06-28 ENCOUNTER — Encounter: Payer: Self-pay | Admitting: Family Medicine

## 2018-06-28 ENCOUNTER — Ambulatory Visit (INDEPENDENT_AMBULATORY_CARE_PROVIDER_SITE_OTHER): Payer: 59 | Admitting: Family Medicine

## 2018-06-28 VITALS — BP 140/75 | HR 77 | Temp 98.1°F | Resp 18 | Ht 63.0 in | Wt 206.0 lb

## 2018-06-28 DIAGNOSIS — L918 Other hypertrophic disorders of the skin: Secondary | ICD-10-CM | POA: Diagnosis not present

## 2018-06-28 NOTE — Progress Notes (Signed)
Subjective:    Patient ID: Heather Lam, female    DOB: 1960-09-07, 57 y.o.   MRN: [id number]  Chief Complaint:  Nevus (skin tag removal )   HPI: Heather Lam is a 57 y.o. female presenting on 06/28/2018 for Nevus (skin tag removal )   1. Skin tag   Pt presents today for skin tag removal. Pt states she has four skin tags she would like removed. The skin tags are located along her bra line and underwear line. States her clothing gets caught on the skin tags causing irritation and pain. States she has had these lesions for a while. Denies erythema, drainage, discoloration, or pain.    Relevant past medical, surgical, family, and social history reviewed and updated as indicated.  Allergies and medications reviewed and updated.   Past Medical History:  Diagnosis Date  . Anxiety   . Back pain   . CAD (coronary artery disease)   . Chest pain   . Complication of anesthesia   . Constipation   . Diverticulitis   . Diverticulosis   . Heart attack ([place]) 2010  . History of kidney stones   . Hyperlipemia   . Hypertension   . Joint pain   . PONV (postoperative nausea and vomiting)   . Skin cancer    on nose  . Sleep apnea    wears CPAP nightly  . Vitamin D deficiency     Past Surgical History:  Procedure Laterality Date  . APPENDECTOMY    . BACK SURGERY    . CARPAL TUNNEL RELEASE Right   . CARPAL TUNNEL RELEASE Left 08/13/2016   Procedure: LEFT CARPAL TUNNEL RELEASE;  Surgeon: Roseanne Kaufman, MD;  Location: [place];  Service: Orthopedics;  Laterality: Left;  . CORONARY STENT PLACEMENT     2 stents in the same artery  . ENDOMETRIAL ABLATION  2005  . EXTRACORPOREAL SHOCK WAVE LITHOTRIPSY Right 06/27/2017   Procedure: RIGHT EXTRACORPOREAL SHOCK WAVE LITHOTRIPSY (ESWL);  Surgeon: Franchot Gallo, MD;  [facility];  Service: Urology;  Laterality: Right;  . MOHS SURGERY    . TUBAL LIGATION  1991    Social History   Socioeconomic History  .  Marital status: Married    Spouse name: Not on file  . Number of children: Not on file  . Years of education: Not on file  . Highest education level: Not on file  Occupational History  . Occupation: Optician, dispensing: [employer]  . Financial resource strain: Not on file  . Food insecurity:    Worry: Not on file    Inability: Not on file  . Transportation needs:    Medical: Not on file    Non-medical: Not on file  Tobacco Use  . Smoking status: Former Research scientist (life sciences)  . Smokeless tobacco: Never Used  Substance and Sexual Activity  . Alcohol use: Yes    Comment: Rare   . Drug use: No  . Sexual activity: Yes  Lifestyle  . Physical activity:    Days per week: Not on file    Minutes per session: Not on file  . Stress: Not on file  Relationships  . Social connections:    Talks on phone: Not on file    Gets together: Not on file    Attends religious service: Not on file    Active member of club or organization: Not on file    Attends meetings of clubs  or organizations: Not on file    Relationship status: Not on file  . Intimate partner violence:    Fear of current or ex partner: Not on file    Emotionally abused: Not on file    Physically abused: Not on file    Forced sexual activity: Not on file  Other Topics Concern  . Not on file  Social History Narrative   Caffeine daily     Outpatient Encounter Medications as of 06/28/2018  Medication Sig  . ALPRAZolam (XANAX) 0.25 MG tablet Take 1 tablet (0.25 mg total) by mouth daily as needed for anxiety.  [name] aspirin EC 81 MG tablet Take 1 tablet (81 mg total) by mouth daily.  . Cholecalciferol 2000 units CAPS Take 1 capsule (2,000 Units total) by mouth daily.  . Coenzyme Q10 (CO Q10) 100 MG CAPS Take 1 capsule by mouth daily.  . DULoxetine (CYMBALTA) 30 MG capsule Take 2 capsules (60 mg total) by mouth daily.  Vanessa Kick Ethyl (VASCEPA) 1 g CAPS Take 2 capsules 2 (two) times daily by mouth.  .  losartan-hydrochlorothiazide (HYZAAR) 100-25 MG tablet Take 1 tablet by mouth daily.  . metoprolol tartrate (LOPRESSOR) 25 MG tablet Take 1 tablet (25 mg total) by mouth 2 (two) times daily.  . nitroGLYCERIN (NITROSTAT) 0.4 MG SL tablet 1 TABLET UNDER TONGUE EVERY 5 MINUTES UP TO 3 TIMES FOR CHEST PAIN THEN CALL DR IF NO RELIEF  . rosuvastatin (CRESTOR) 10 MG tablet Take 1 tablet (10 mg total) by mouth daily.  . traZODone (DESYREL) 50 MG tablet Take 1-2 tablets (50-100 mg total) by mouth at bedtime as needed for sleep.   No facility-administered encounter medications on file as of 06/28/2018.     No Known Allergies  Review of Systems  Constitutional: Negative for chills, fatigue and fever.  Skin:       Irritated skin tags  All other systems reviewed and are negative.       Objective:    BP 140/75   Pulse 77   Temp 98.1 F (36.7 C) (Oral)   Resp 18   Ht 5\' 3"  (1.6 m)   Wt 206 lb (93.4 kg)   LMP 12/31/2013   BMI 36.49 kg/m    Wt Readings from Last 3 Encounters:  06/28/18 206 lb (93.4 kg)  06/05/18 206 lb 9.6 oz (93.7 kg)  01/23/18 189 lb 3.2 oz (85.8 kg)    Physical Exam  Constitutional: She is oriented to person, place, and time. She appears well-developed and well-nourished. She is cooperative. No distress.  HENT:  Head: Normocephalic.  Cardiovascular: Normal rate and regular rhythm.  Pulmonary/Chest: Effort normal. No respiratory distress.  Abdominal: Normal appearance.  Neurological: She is alert and oriented to person, place, and time.  Skin: Skin is warm and dry. Capillary refill takes less than 2 seconds. Lesion noted.     Pedunculated flesh colored lesions on narrow stalks without erythema or drainage. Nontender.   Psychiatric: She has a normal mood and affect. Her behavior is normal. Judgment and thought content normal.  Nursing note and vitals reviewed.   Results for orders placed or performed in visit on 06/23/18  MYOCARDIAL PERFUSION IMAGING  Result  Value Ref Range   Rest HR 81 bpm   Rest BP 178/90 mmHg   Peak HR 100 bpm   Peak BP 189/75 mmHg   SSS 5    SRS 1    SDS 4    TID 1.09    LV  sys vol 11 mL   LV dias vol 58 46 - 106 mL    PROCEDURE NOTE: Skin tag removal Patient given informed consent and verbal consent for the procedure was obtained.  Appropriate time out taken. Areas of concern cleansed with betadine swabs.  Areas were sprayed with hurricane spray. Once anaesthesia obtained, skin tags were removed using sterile 11 blade and forceps.  4 skin tags were removed in total.  The patient tolerated the procedure well.  Minimal bleeding which was controlled with silver nitrate application.  Patient given post procedure instructions.   Pertinent labs & imaging results that were available during my care of the patient were reviewed by me and considered in my medical decision making.  Assessment & Plan:  Thania was seen today for nevus.  Diagnoses and all orders for this visit:  Skin tag Post skin tag removal instructions provided.     Continue all other maintenance medications.  Follow up plan: Return if symptoms worsen or fail to improve.  Educational handout given for skin tag removal  The above assessment and management plan was discussed with the patient. The patient verbalized understanding of and has agreed to the management plan. Patient is aware to call the clinic if symptoms persist or worsen. Patient is aware when to return to the clinic for a follow-up visit. Patient educated on when it is appropriate to go to the emergency department.   Monia Pouch, FNP-C [facility] [phone]

## 2018-06-28 NOTE — Patient Instructions (Signed)
Home skin care after skin tag removal. - Clean the area with soap and water two times a day - Don't use hydrogen peroxide or alcohol, which can slow healing. - You may cover the wound with a thin layer of petroleum jelly, such as Vaseline, and a non-stick bandage.  If you develop increased pain, warmth at removal site, fevers, chills, redness, please seek immediate medical attention.  Otherwise, expect that areas treated will heal within the next couple of days.

## 2018-07-03 ENCOUNTER — Other Ambulatory Visit: Payer: Self-pay | Admitting: *Deleted

## 2018-07-03 MED ORDER — DULOXETINE HCL 30 MG PO CPEP: 60.0000 mg | ORAL_CAPSULE | Freq: Every day | ORAL | 1 refills | Status: DC

## 2018-07-03 MED ORDER — ROSUVASTATIN CALCIUM 10 MG PO TABS: 10.0000 mg | ORAL_TABLET | Freq: Every day | ORAL | 1 refills | Status: DC

## 2018-07-03 MED FILL — SHIPPING COST: 1 days supply | Qty: 1 | Fill #10

## 2018-07-03 MED FILL — DULoxetine HCL 30 MG CPEP: 30 | 90 days supply | Qty: 180 | Fill #0

## 2018-07-03 MED FILL — ROSUVASTATIN CALCIUM 10 MG: 10 | 90 days supply | Qty: 90 | Fill #0

## 2018-07-10 ENCOUNTER — Other Ambulatory Visit: Payer: Self-pay | Admitting: Physician Assistant

## 2018-07-11 MED FILL — VASCEPA 1 GM CAPSULE: 1 | 90 days supply | Qty: 360 | Fill #0

## 2018-07-11 MED FILL — SHIPPING COST: 1 days supply | Qty: 1 | Fill #11

## 2018-07-21 ENCOUNTER — Ambulatory Visit: Payer: 59 | Admitting: Interventional Cardiology

## 2018-07-24 ENCOUNTER — Other Ambulatory Visit: Payer: Self-pay | Admitting: *Deleted

## 2018-07-24 MED ORDER — LOSARTAN POTASSIUM-HCTZ 100-25 MG PO TABS: 1.0000 | ORAL_TABLET | Freq: Every day | ORAL | 2 refills | Status: DC

## 2018-07-24 MED FILL — LOSARTAN-HCTZ 100-25 MG TAB: 100-25 | 30 days supply | Qty: 30 | Fill #0

## 2018-07-24 MED FILL — METOPROLOL TARTRATE 25 MG T: 25 | 90 days supply | Qty: 180 | Fill #3

## 2018-07-24 MED FILL — SHIPPING COST: 1 days supply | Qty: 1 | Fill #12

## 2018-08-07 ENCOUNTER — Encounter: Payer: Self-pay | Admitting: Physician Assistant

## 2018-08-07 ENCOUNTER — Ambulatory Visit (INDEPENDENT_AMBULATORY_CARE_PROVIDER_SITE_OTHER): Payer: No Typology Code available for payment source | Admitting: Physician Assistant

## 2018-08-07 VITALS — BP 142/76 | HR 74 | Ht 63.0 in | Wt 198.0 lb

## 2018-08-07 DIAGNOSIS — F411 Generalized anxiety disorder: Secondary | ICD-10-CM | POA: Diagnosis not present

## 2018-08-07 DIAGNOSIS — F3289 Other specified depressive episodes: Secondary | ICD-10-CM

## 2018-08-07 MED ORDER — BUSPIRONE HCL 15 MG PO TABS: 15.0000 mg | ORAL_TABLET | Freq: Three times a day (TID) | ORAL | 5 refills | Status: DC

## 2018-08-07 MED ORDER — BUPROPION HCL ER (SR) 150 MG PO TB12: 150.0000 mg | ORAL_TABLET | Freq: Two times a day (BID) | ORAL | Status: DC

## 2018-08-07 MED ORDER — ALPRAZOLAM 0.25 MG PO TABS: 0.2500 mg | ORAL_TABLET | Freq: Every day | ORAL | 0 refills | Status: DC | PRN

## 2018-08-07 MED FILL — SHIPPING COST: 1 days supply | Qty: 1 | Fill #13

## 2018-08-07 MED FILL — busPIRone HCL 15 MG TABS: 15 | 20 days supply | Qty: 60 | Fill #0

## 2018-08-08 ENCOUNTER — Other Ambulatory Visit: Payer: Self-pay | Admitting: Physician Assistant

## 2018-08-08 ENCOUNTER — Other Ambulatory Visit: Payer: Self-pay | Admitting: *Deleted

## 2018-08-08 MED ORDER — CEFDINIR 300 MG PO CAPS: 300.0000 mg | ORAL_CAPSULE | Freq: Two times a day (BID) | ORAL | 0 refills | Status: DC

## 2018-08-08 MED ORDER — ARIPIPRAZOLE 5 MG PO TABS: 5.0000 mg | ORAL_TABLET | Freq: Every day | ORAL | 2 refills | Status: DC

## 2018-08-08 MED FILL — ARIPiprazole 5 MG TABS: 5 | 30 days supply | Qty: 30 | Fill #0

## 2018-08-10 NOTE — Progress Notes (Signed)
BP (!) 142/76   Pulse 74   Ht 5\' 3"  (1.6 m)   Wt 198 lb (89.8 kg)   LMP 12/31/2013   BMI 35.07 kg/m    Subjective:    Patient ID: Heather Lam, female    DOB: 10-Feb-1961, 58 y.o.   MRN: [id number]  HPI: Heather Lam is a 58 y.o. female presenting on 08/07/2018 for Depression  This patient comes in for periodic recheck on medications and conditions including depression and anxiety. The patient has not further nasal concerning her husband with whom she is separated.  Involved another woman.  He had been talking her prior to their separation.  She is very distraught over everything that is going.  She does have an appointment with her counselor later this week.  Had a long discussion about the medications that used to help at this time.  We have even talked about using Abilify to help fully calm down some of the strong emotional reaction.  All medications are reviewed today. There are no reports of any problems with the medications. All of the medical conditions are reviewed and updated.  Lab work is reviewed and will be ordered as medically necessary. There are no new problems reported with today's visit.  Depression screen [facility] 2/9 08/07/2018 07/19/2017 05/04/2017 04/01/2017 07/28/2016  Decreased Interest 2 0 0 0 2  Down, Depressed, Hopeless 3 0 0 0 2  PHQ - 2 Score 5 0 0 0 4  Altered sleeping 2 - - - 1  Tired, decreased energy 1 - - - 2  Change in appetite 1 - - - 1  Feeling bad or failure about yourself  3 - - - 0  Trouble concentrating 2 - - - 0  Moving slowly or fidgety/restless 0 - - - 0  Suicidal thoughts 1 - - - 0  PHQ-9 Score 15 - - - 8  Difficult doing work/chores - - - - -     Past Medical History:  Diagnosis Date  . Anxiety   . Back pain   . CAD (coronary artery disease)   . Chest pain   . Complication of anesthesia   . Constipation   . Diverticulitis   . Diverticulosis   . Heart attack ([place]) 2010  . History of kidney stones   . Hyperlipemia   . Hypertension    . Joint pain   . PONV (postoperative nausea and vomiting)   . Skin cancer    on nose  . Sleep apnea    wears CPAP nightly  . Vitamin D deficiency    Relevant past medical, surgical, family and social history reviewed and updated as indicated. Interim medical history since our last visit reviewed. Allergies and medications reviewed and updated. DATA REVIEWED: CHART IN EPIC  Family History reviewed for pertinent findings.  Review of Systems  Constitutional: Negative.  Negative for activity change, fatigue and fever.  HENT: Negative.   Eyes: Negative.   Respiratory: Negative.  Negative for cough.   Cardiovascular: Negative.  Negative for chest pain.  Gastrointestinal: Negative.  Negative for abdominal pain.  Endocrine: Negative.   Genitourinary: Negative.  Negative for dysuria.  Musculoskeletal: Negative.   Skin: Negative.   Neurological: Negative.   Psychiatric/Behavioral: Positive for agitation, decreased concentration, dysphoric mood and sleep disturbance. The patient is nervous/anxious.     Allergies as of 08/07/2018   No Known Allergies     Medication List       Accurate as of August 07, 2018 11:59 PM. Always use your most recent med list.        ALPRAZolam 0.25 MG tablet Commonly known as:  XANAX Take 1 tablet (0.25 mg total) by mouth daily as needed for anxiety.   aspirin EC 81 MG tablet Take 1 tablet (81 mg total) by mouth daily.   buPROPion 150 MG 12 hr tablet Commonly known as:  WELLBUTRIN SR Take 1 tablet (150 mg total) by mouth 2 (two) times daily.   busPIRone 15 MG tablet Commonly known as:  BUSPAR Take 1 tablet (15 mg total) by mouth 3 (three) times daily.   Cholecalciferol 50 MCG (2000 UT) Caps Take 1 capsule (2,000 Units total) by mouth daily.   Co Q10 100 MG Caps Take 1 capsule by mouth daily.   DULoxetine 30 MG capsule Commonly known as:  CYMBALTA Take 2 capsules (60 mg total) by mouth daily.   losartan-hydrochlorothiazide 100-25 MG  tablet Commonly known as:  HYZAAR Take 1 tablet by mouth daily.   metoprolol tartrate 25 MG tablet Commonly known as:  LOPRESSOR Take 1 tablet (25 mg total) by mouth 2 (two) times daily.   nitroGLYCERIN 0.4 MG SL tablet Commonly known as:  NITROSTAT 1 TABLET UNDER TONGUE EVERY 5 MINUTES UP TO 3 TIMES FOR CHEST PAIN THEN CALL DR IF NO RELIEF   rosuvastatin 10 MG tablet Commonly known as:  CRESTOR Take 1 tablet (10 mg total) by mouth daily.   traZODone 50 MG tablet Commonly known as:  DESYREL Take 1-2 tablets (50-100 mg total) by mouth at bedtime as needed for sleep.   VASCEPA 1 g Caps Generic drug:  Icosapent Ethyl TAKE 2 CAPULES BY MOUTH TWICE DAILY          Objective:    BP (!) 142/76   Pulse 74   Ht 5\' 3"  (1.6 m)   Wt 198 lb (89.8 kg)   LMP 12/31/2013   BMI 35.07 kg/m   No Known Allergies  Wt Readings from Last 3 Encounters:  08/07/18 198 lb (89.8 kg)  06/28/18 206 lb (93.4 kg)  06/05/18 206 lb 9.6 oz (93.7 kg)    Physical Exam Constitutional:      Appearance: She is well-developed.  HENT:     Head: Normocephalic and atraumatic.  Eyes:     Conjunctiva/sclera: Conjunctivae normal.     Pupils: Pupils are equal, round, and reactive to light.  Cardiovascular:     Rate and Rhythm: Normal rate and regular rhythm.     Heart sounds: Normal heart sounds.  Pulmonary:     Effort: Pulmonary effort is normal.     Breath sounds: Normal breath sounds.  Abdominal:     General: Bowel sounds are normal.     Palpations: Abdomen is soft.  Skin:    General: Skin is warm and dry.     Findings: No rash.  Neurological:     Mental Status: She is alert and oriented to person, place, and time.     Deep Tendon Reflexes: Reflexes are normal and symmetric.  Psychiatric:        Mood and Affect: Mood is depressed. Affect is tearful.        Behavior: Behavior normal.        Thought Content: Thought content normal.        Judgment: Judgment normal.         Assessment &  Plan:   1. Generalized anxiety disorder - ALPRAZolam (XANAX) 0.25 MG tablet; Take 1  tablet (0.25 mg total) by mouth daily as needed for anxiety.  Dispense: 30 tablet; Refill: 0 - busPIRone (BUSPAR) 15 MG tablet; Take 1 tablet (15 mg total) by mouth 3 (three) times daily.  Dispense: 60 tablet; Refill: 5  2. Other depression - busPIRone (BUSPAR) 15 MG tablet; Take 1 tablet (15 mg total) by mouth 3 (three) times daily.  Dispense: 60 tablet; Refill: 5   Continue all other maintenance medications as listed above.  Follow up plan: No follow-ups on file.  Educational handout given for Killian PA-C [facility] [address]  [place], [place] [address] [phone]   08/10/2018, 8:55 PM

## 2018-08-18 ENCOUNTER — Ambulatory Visit: Payer: 59 | Admitting: Interventional Cardiology

## 2018-08-18 ENCOUNTER — Encounter

## 2018-08-21 ENCOUNTER — Telehealth: Payer: Self-pay

## 2018-08-21 MED FILL — LOSARTAN-HCTZ 100-25 MG TAB: 100-25 | 30 days supply | Qty: 30 | Fill #1

## 2018-08-21 MED FILL — SHIPPING COST: 1 days supply | Qty: 1 | Fill #14

## 2018-08-21 NOTE — Telephone Encounter (Signed)
[facility] - Left Message  

## 2018-08-22 ENCOUNTER — Other Ambulatory Visit: Payer: Self-pay | Admitting: *Deleted

## 2018-08-22 ENCOUNTER — Telehealth: Payer: Self-pay | Admitting: *Deleted

## 2018-08-22 MED ORDER — ARIPIPRAZOLE 10 MG PO TABS: 10.0000 mg | ORAL_TABLET | Freq: Every day | ORAL | 0 refills | Status: DC

## 2018-08-22 MED FILL — ARIPiprazole 10 MG TABS: 10 | 90 days supply | Qty: 90 | Fill #0

## 2018-08-22 MED FILL — SHIPPING COST: 1 days supply | Qty: 1 | Fill #15

## 2018-08-22 NOTE — Telephone Encounter (Signed)
Patient has a kidney stone and it is moving around. She has a rx for flomax that expired in August. She wants to know if you think it is ok for her still to use?

## 2018-08-22 NOTE — Telephone Encounter (Signed)
Yes, okay to use. The actual shelf like is usually 2 years out or more

## 2018-08-22 NOTE — Telephone Encounter (Signed)
Patient aware.

## 2018-08-29 ENCOUNTER — Other Ambulatory Visit: Payer: No Typology Code available for payment source

## 2018-08-29 DIAGNOSIS — R5383 Other fatigue: Secondary | ICD-10-CM

## 2018-08-29 DIAGNOSIS — E559 Vitamin D deficiency, unspecified: Secondary | ICD-10-CM

## 2018-08-30 ENCOUNTER — Other Ambulatory Visit: Payer: Self-pay | Admitting: Physician Assistant

## 2018-08-30 ENCOUNTER — Telehealth: Payer: Self-pay | Admitting: *Deleted

## 2018-08-30 LAB — CBC WITH DIFFERENTIAL/PLATELET
Basophils Absolute: 0.1 10*3/uL (ref 0.0–0.2)
Basos: 1 %
EOS (ABSOLUTE): 0.3 10*3/uL (ref 0.0–0.4)
Eos: 2 %
Hematocrit: 41.8 % (ref 34.0–46.6)
Hemoglobin: 13.8 g/dL (ref 11.1–15.9)
Immature Grans (Abs): 0.1 10*3/uL (ref 0.0–0.1)
Immature Granulocytes: 1 %
Lymphocytes Absolute: 3.4 10*3/uL — ABNORMAL HIGH (ref 0.7–3.1)
Lymphs: 27 %
MCH: 27.5 pg (ref 26.6–33.0)
MCHC: 33 g/dL (ref 31.5–35.7)
MCV: 83 fL (ref 79–97)
Monocytes Absolute: 0.6 10*3/uL (ref 0.1–0.9)
Monocytes: 5 %
Neutrophils Absolute: 8.3 10*3/uL — ABNORMAL HIGH (ref 1.4–7.0)
Neutrophils: 64 %
Platelets: 263 10*3/uL (ref 150–450)
RBC: 5.01 x10E6/uL (ref 3.77–5.28)
RDW: 13.9 % (ref 11.7–15.4)
WBC: 12.8 10*3/uL — ABNORMAL HIGH (ref 3.4–10.8)

## 2018-08-30 LAB — CMP14+EGFR
ALT: 18 IU/L (ref 0–32)
AST: 16 IU/L (ref 0–40)
Albumin/Globulin Ratio: 1.5 (ref 1.2–2.2)
Albumin: 4 g/dL (ref 3.8–4.9)
Alkaline Phosphatase: 72 IU/L (ref 39–117)
BUN/Creatinine Ratio: 18 (ref 9–23)
BUN: 15 mg/dL (ref 6–24)
Bilirubin Total: <0.2 mg/dL (ref 0.0–1.2)
CO2: 28 mmol/L (ref 20–29)
Calcium: 9.2 mg/dL (ref 8.7–10.2)
Chloride: 103 mmol/L (ref 96–106)
Creatinine, Ser: 0.82 mg/dL (ref 0.57–1.00)
GFR calc Af Amer: 92 mL/min/{1.73_m2} (ref >59)
GFR calc non Af Amer: 80 mL/min/{1.73_m2} (ref >59)
Globulin, Total: 2.7 g/dL (ref 1.5–4.5)
Glucose: 85 mg/dL (ref 65–99)
Potassium: 3.8 mmol/L (ref 3.5–5.2)
Sodium: 146 mmol/L — ABNORMAL HIGH (ref 134–144)
Total Protein: 6.7 g/dL (ref 6.0–8.5)

## 2018-08-30 LAB — THYROID PANEL WITH TSH
Free Thyroxine Index: 1.9 (ref 1.2–4.9)
T3 Uptake Ratio: 29 % (ref 24–39)
T4, Total: 6.6 ug/dL (ref 4.5–12.0)
TSH: 1.52 u[IU]/mL (ref 0.450–4.500)

## 2018-08-30 LAB — VITAMIN D 25 HYDROXY (VIT D DEFICIENCY, FRACTURES): Vit D, 25-Hydroxy: 25.2 ng/mL — ABNORMAL LOW (ref 30.0–100.0)

## 2018-08-30 MED ORDER — RISPERIDONE 0.5 MG PO TABS: 0.5000 mg | ORAL_TABLET | Freq: Every day | ORAL | 1 refills | Status: DC

## 2018-08-30 MED ORDER — VITAMIN D (ERGOCALCIFEROL) 1.25 MG (50000 UNIT) PO CAPS: 50000.0000 [IU] | ORAL_CAPSULE | ORAL | 3 refills | Status: DC

## 2018-08-30 MED FILL — VIT D2 1.25 MG (50,000 UNIT: 1.25 MG | 84 days supply | Qty: 12 | Fill #0

## 2018-08-30 MED FILL — SHIPPING COST: 1 days supply | Qty: 1 | Fill #16

## 2018-08-30 NOTE — Telephone Encounter (Signed)
Patient states that since increasing the abilify she is became more anxious, restless, blurry vision, and insomnia. Patient states that you discussed maybe risperdone. Please advise If 30 day supply please send to [facility] or if 90 day [facility] outpatient.

## 2018-08-30 NOTE — Telephone Encounter (Signed)
Sent to [facility]

## 2018-08-31 NOTE — Telephone Encounter (Signed)
Patient aware.

## 2018-09-08 ENCOUNTER — Encounter: Payer: Self-pay | Admitting: Family Medicine

## 2018-09-08 ENCOUNTER — Ambulatory Visit (INDEPENDENT_AMBULATORY_CARE_PROVIDER_SITE_OTHER): Payer: No Typology Code available for payment source | Admitting: Family Medicine

## 2018-09-08 VITALS — BP 139/76 | HR 74 | Temp 97.8°F | Ht 63.0 in | Wt 198.0 lb

## 2018-09-08 DIAGNOSIS — D229 Melanocytic nevi, unspecified: Secondary | ICD-10-CM

## 2018-09-08 DIAGNOSIS — L918 Other hypertrophic disorders of the skin: Secondary | ICD-10-CM

## 2018-09-08 NOTE — Progress Notes (Signed)
Subjective:  Patient ID: Heather Lam, female    DOB: October 16, 1960, 58 y.o.   MRN: [id number]  Chief Complaint:  Mole Removal (abnormal moles )   HPI: Heather Lam is a 59 y.o. female presenting on 09/08/2018 for Mole Removal (abnormal moles )   1. Multiple atypical skin moles   Multiple abnormal moles that are changing in size and appearance. Several to back and neck, one to right upper anterior thigh.    Relevant past medical, surgical, family, and social history reviewed and updated as indicated.  Allergies and medications reviewed and updated.   Past Medical History:  Diagnosis Date  . Anxiety   . Back pain   . CAD (coronary artery disease)   . Chest pain   . Complication of anesthesia   . Constipation   . Diverticulitis   . Diverticulosis   . Heart attack ([place]) 2010  . History of kidney stones   . Hyperlipemia   . Hypertension   . Joint pain   . PONV (postoperative nausea and vomiting)   . Skin cancer    on nose  . Sleep apnea    wears CPAP nightly  . Vitamin D deficiency     Past Surgical History:  Procedure Laterality Date  . APPENDECTOMY    . BACK SURGERY    . CARPAL TUNNEL RELEASE Right   . CARPAL TUNNEL RELEASE Left 08/13/2016   Procedure: LEFT CARPAL TUNNEL RELEASE;  Surgeon: Roseanne Kaufman, MD;  Location: [facility];  Service: Orthopedics;  Laterality: Left;  . CORONARY STENT PLACEMENT     2 stents in the same artery  . ENDOMETRIAL ABLATION  2005  . EXTRACORPOREAL SHOCK WAVE LITHOTRIPSY Right 06/27/2017   Procedure: RIGHT EXTRACORPOREAL SHOCK WAVE LITHOTRIPSY (ESWL);  Surgeon: Franchot Gallo, MD;  Location: [facility];  Service: Urology;  Laterality: Right;  . MOHS SURGERY    . TUBAL LIGATION  1991    Social History   Socioeconomic History  . Marital status: Married    Spouse name: Not on file  . Number of children: Not on file  . Years of education: Not on file  . Highest education level: Not on file  Occupational  History  . Occupation: Optician, dispensing: [employer]  . Financial resource strain: Not on file  . Food insecurity:    Worry: Not on file    Inability: Not on file  . Transportation needs:    Medical: Not on file    Non-medical: Not on file  Tobacco Use  . Smoking status: Former Research scientist (life sciences)  . Smokeless tobacco: Never Used  Substance and Sexual Activity  . Alcohol use: Yes    Comment: Rare   . Drug use: No  . Sexual activity: Yes  Lifestyle  . Physical activity:    Days per week: Not on file    Minutes per session: Not on file  . Stress: Not on file  Relationships  . Social connections:    Talks on phone: Not on file    Gets together: Not on file    Attends religious service: Not on file    Active member of club or organization: Not on file    Attends meetings of clubs or organizations: Not on file    Relationship status: Not on file  . Intimate partner violence:    Fear of current or ex partner: Not on file    Emotionally abused: Not on  file    Physically abused: Not on file    Forced sexual activity: Not on file  Other Topics Concern  . Not on file  Social History Narrative   Caffeine daily     Outpatient Encounter Medications as of 09/08/2018  Medication Sig  . ALPRAZolam (XANAX) 0.25 MG tablet Take 1 tablet (0.25 mg total) by mouth daily as needed for anxiety.  [name] aspirin EC 81 MG tablet Take 1 tablet (81 mg total) by mouth daily.  . busPIRone (BUSPAR) 15 MG tablet Take 1 tablet (15 mg total) by mouth 3 (three) times daily.  . cefdinir (OMNICEF) 300 MG capsule Take 1 capsule (300 mg total) by mouth 2 (two) times daily. 1 po BID  . Cholecalciferol 2000 units CAPS Take 1 capsule (2,000 Units total) by mouth daily.  . Coenzyme Q10 (CO Q10) 100 MG CAPS Take 1 capsule by mouth daily.  . DULoxetine (CYMBALTA) 30 MG capsule Take 2 capsules (60 mg total) by mouth daily.  [name] losartan-hydrochlorothiazide (HYZAAR) 100-25 MG tablet Take 1 tablet by mouth daily.  .  metoprolol tartrate (LOPRESSOR) 25 MG tablet Take 1 tablet (25 mg total) by mouth 2 (two) times daily.  . nitroGLYCERIN (NITROSTAT) 0.4 MG SL tablet 1 TABLET UNDER TONGUE EVERY 5 MINUTES UP TO 3 TIMES FOR CHEST PAIN THEN CALL DR IF NO RELIEF  . risperiDONE (RISPERDAL) 0.5 MG tablet Take 1 tablet (0.5 mg total) by mouth at bedtime.  . rosuvastatin (CRESTOR) 10 MG tablet Take 1 tablet (10 mg total) by mouth daily.  . traZODone (DESYREL) 50 MG tablet Take 1-2 tablets (50-100 mg total) by mouth at bedtime as needed for sleep.  [name] VASCEPA 1 g CAPS TAKE 2 CAPULES BY MOUTH TWICE DAILY  . Vitamin D, Ergocalciferol, (DRISDOL) 1.25 MG (50000 UT) CAPS capsule Take 1 capsule (50,000 Units total) by mouth every 7 (seven) days.   No facility-administered encounter medications on file as of 09/08/2018.     No Known Allergies  Review of Systems  Constitutional: Negative for chills, fatigue and fever.  Skin: Positive for color change.       Multiple abnormal moles.   Hematological: Negative for adenopathy. Does not bruise/bleed easily.  All other systems reviewed and are negative.       Objective:  BP 139/76   Pulse 74   Temp 97.8 F (36.6 C) (Oral)   Ht 5' 3"  (1.6 m)   Wt 198 lb (89.8 kg)   LMP 12/31/2013   BMI 35.07 kg/m    Wt Readings from Last 3 Encounters:  09/08/18 198 lb (89.8 kg)  08/07/18 198 lb (89.8 kg)  06/28/18 206 lb (93.4 kg)    Physical Exam Vitals signs and nursing note reviewed.  Constitutional:      Appearance: Normal appearance. She is well-developed and well-groomed.  HENT:     Head: Normocephalic and atraumatic.  Cardiovascular:     Rate and Rhythm: Normal rate and regular rhythm.  Pulmonary:     Effort: Pulmonary effort is normal.     Breath sounds: Normal breath sounds.  Skin:    General: Skin is warm and dry.     Capillary Refill: Capillary refill takes less than 2 seconds.       Neurological:     General: No focal deficit present.     Mental Status: She  is alert and oriented to person, place, and time.  Psychiatric:        Mood and Affect: Mood normal.  Behavior: Behavior normal. Behavior is cooperative.        Thought Content: Thought content normal.        Judgment: Judgment normal.     Results for orders placed or performed in visit on 08/29/18  CBC with Differential/Platelet  Result Value Ref Range   WBC 12.8 (H) 3.4 - 10.8 x10E3/uL   RBC 5.01 3.77 - 5.28 x10E6/uL   Hemoglobin 13.8 11.1 - 15.9 g/dL   Hematocrit 41.8 34.0 - 46.6 %   MCV 83 79 - 97 fL   MCH 27.5 26.6 - 33.0 pg   MCHC 33.0 31.5 - 35.7 g/dL   RDW 13.9 11.7 - 15.4 %   Platelets 263 150 - 450 x10E3/uL   Neutrophils 64 Not Estab. %   Lymphs 27 Not Estab. %   Monocytes 5 Not Estab. %   Eos 2 Not Estab. %   Basos 1 Not Estab. %   Neutrophils Absolute 8.3 (H) 1.4 - 7.0 x10E3/uL   Lymphocytes Absolute 3.4 (H) 0.7 - 3.1 x10E3/uL   Monocytes Absolute 0.6 0.1 - 0.9 x10E3/uL   EOS (ABSOLUTE) 0.3 0.0 - 0.4 x10E3/uL   Basophils Absolute 0.1 0.0 - 0.2 x10E3/uL   Immature Granulocytes 1 Not Estab. %   Immature Grans (Abs) 0.1 0.0 - 0.1 x10E3/uL  CMP14+EGFR  Result Value Ref Range   Glucose 85 65 - 99 mg/dL   BUN 15 6 - 24 mg/dL   Creatinine, Ser 0.82 0.57 - 1.00 mg/dL   GFR calc non Af Amer 80 >59 mL/min/1.73   GFR calc Af Amer 92 >59 mL/min/1.73   BUN/Creatinine Ratio 18 9 - 23   Sodium 146 (H) 134 - 144 mmol/L   Potassium 3.8 3.5 - 5.2 mmol/L   Chloride 103 96 - 106 mmol/L   CO2 28 20 - 29 mmol/L   Calcium 9.2 8.7 - 10.2 mg/dL   Total Protein 6.7 6.0 - 8.5 g/dL   Albumin 4.0 3.8 - 4.9 g/dL   Globulin, Total 2.7 1.5 - 4.5 g/dL   Albumin/Globulin Ratio 1.5 1.2 - 2.2   Bilirubin Total <0.2 0.0 - 1.2 mg/dL   Alkaline Phosphatase 72 39 - 117 IU/L   AST 16 0 - 40 IU/L   ALT 18 0 - 32 IU/L  VITAMIN D 25 Hydroxy (Vit-D Deficiency, Fractures)  Result Value Ref Range   Vit D, 25-Hydroxy 25.2 (L) 30.0 - 100.0 ng/mL  Thyroid Panel With TSH  Result Value Ref  Range   TSH 1.520 0.450 - 4.500 uIU/mL   T4, Total 6.6 4.5 - 12.0 ug/dL   T3 Uptake Ratio 29 24 - 39 %   Free Thyroxine Index 1.9 1.2 - 4.9     Pts name, DOB, allergies, and procedures verified.   Skin lesion removal: Using forceps and 11 blade excised 3 skin lesions from anterior neck. Used silver nitrate sticks for hemostasis and pressure dressing with topical antibiotic. Patient tolerated well and had minimal bleeding.  Skin lesion removal: Local anesthetic to right upper anterior thigh using 1% lidocaine with epinephrine. Local anesthesia well achieved. Using forceps and 11 blade 1 skin lesion approximately 6 mm in size was removed and sent to pathology. Used silver nitrate sticks for hemostasis and pressure dressing with topical antibiotic. Patient tolerated well and had minimal bleeding.  Skin lesion removal: Using shave biopsy excised 2 skin lesions from upper back. Used silver nitrate sticks for hemostasis and pressure dressing with topical antibiotic. Patient tolerated well and had minimal  bleeding.  Cryotherapy used to freeze 5 lesions to upper back. 3 freeze thaw cycles performed on each lesion.   Pt tolerated all procedures well.   Pertinent labs & imaging results that were available during my care of the patient were reviewed by me and considered in my medical decision making.  Assessment & Plan:  Heather Lam was seen today for mole removal.  Diagnoses and all orders for this visit:  Multiple atypical skin moles Wound care discussed. Report any new or worsening symptoms.  -     Pathology -     Pathology     Continue all other maintenance medications.  Follow up plan: Return if symptoms worsen or fail to improve.  Educational handout given for mole removal   The above assessment and management plan was discussed with the patient. The patient verbalized understanding of and has agreed to the management plan. Patient is aware to call the clinic if symptoms persist or  worsen. Patient is aware when to return to the clinic for a follow-up visit. Patient educated on when it is appropriate to go to the emergency department.   Monia Pouch, FNP-C [facility] [phone]

## 2018-09-08 NOTE — Patient Instructions (Signed)
Excision of Skin Lesions Excision of a skin lesion is the removal of a section of skin by making small cuts (incisions) in the skin. Through this process, the lesion is completely removed. This procedure is often done to treat or prevent cancer or infection. It may also be done to improve cosmetic appearance. The procedure may be done to remove:  Cancerous (malignant) growths, such as basal cell carcinoma, squamous cell carcinoma, or melanoma.  Noncancerous (benign) growths, such as a cyst or lipoma.  Growths, such as moles or skin tags, which may be removed for cosmetic reasons. Various excision or surgical techniques may be used depending on your condition, the location of the lesion, and your overall health. Tell a health care provider about:  Any allergies you have.  All medicines you are taking, including vitamins, herbs, eye drops, creams, and over-the-counter medicines.  Any problems you or family members have had with anesthetic medicines.  Any blood disorders you have.  Any surgeries you have had.  Any medical conditions you have or have had.  Whether you are pregnant or may be pregnant. What are the risks? Generally, this is a safe procedure. However, problems may occur, including:  Bleeding.  Infection.  Scarring.  Recurrence of the cyst, lipoma, or cancer.  Changes in skin sensation or appearance, such as discoloration or swelling.  Reaction to the anesthetics.  Allergic reaction to surgical materials or ointments.  Damage to nerves, blood vessels, muscles, or other structures.  Continued pain. What happens before the procedure? Medicines Ask your health care provider about:  Changing or stopping your regular medicines. This is especially important if you are taking diabetes medicines or blood thinners.  Taking medicines such as aspirin and ibuprofen. These medicines can thin your blood. Do not take these medicines unless your health care provider tells  you to take them.  Taking over-the-counter medicines, vitamins, herbs, and supplements. General instructions  You may be asked to stop smoking.  You may have an exam or testing.  Ask your health care provider what steps will be taken to help prevent infection. These may include: ? Removing hair at the surgery site. ? Washing skin with a germ-killing soap. ? Taking antibiotic medicine. What happens during the procedure?   You will be given a medicine to numb the area (local anesthetic).  Your health care provider will remove the lesions using one of the following excision techniques. ? Complete surgical excision. This procedure may be done to treat a cancerous growth or a noncancerous cyst or lesion.  A small scalpel or scissors will be used to gently cut around and under the lesion until it is completely removed.  If bleeding occurs, it will be stopped with a device that delivers heat (electrocautery).  The edges of the wound may be stitched (sutured) together.  A bandage (dressing) will be applied.  Samples will be sent to a lab for testing. ? Excision of a cyst.  An incision will be made on the cyst.  The entire cyst will be removed through the incision.  The incision may be closed with sutures. ? Shave excision. This may be done to remove a mole or a skin tag.  A small blade or an electrically heated loop instrument will be used to shave off the lesion.  The wound is usually left to heal on its own without sutures. ? Punch excision. This may be done to remove a mole or a scar or to do a biopsy of the lesion.  A small blade or an electrically heated loop instrument will be used to shave off the lesion.   The wound is usually left to heal on its own without sutures.  ? Punch excision. This may be done to remove a mole or a scar or to do a biopsy of the lesion.   A small tool that is like a cookie cutter or a hole punch is used to cut a circle shape out of the skin.   The outer edges of the skin will be sutured together.   The sample may be sent to a lab for testing.  ? Mohs micrographic surgery. This is usually done to treat skin cancer. This type of excision is mostly used on the face and ears. This procedure is  minimally invasive, and it ensures the best cosmetic outcome.   A scalpel or a loop instrument will be used to remove layers of the lesion until all the abnormal or cancerous tissue has been removed.   The wound may be sutured, depending on its size.   The tissue will be checked under a microscope right away.  Each of the techniques may vary among health care providers and hospitals. At the end of any of these procedures, antibiotic ointment will be applied as needed.  What happens after the procedure?   Return to your normal activities as told by your health care provider. Ask your health care provider what activities are safe for you.   It is up to you to get the results of your procedure. Ask your doctor, or the department that is doing the procedure, when your results will be ready.   Talk with your health care provider to discuss any test results, treatment options, and if necessary, the need for more tests.   Keep all follow-up visits as told by your health care provider. This is important.  Summary   Excision of a skin lesion is the removal of a section of skin by making small cuts (incisions) in the skin. This procedure is often done to treat or prevent cancer and infection, or it may be done to improve cosmetic appearance.   Various excision or surgical techniques may be used depending on your condition, the location of the lesion, and your overall health.   After the procedure, talk with your health care provider to discuss any test results, treatment options, and if necessary, the need for more tests.   Keep all follow-up visits as told by your health care provider. This is important.  This information is not intended to replace advice given to you by your health care provider. Make sure you discuss any questions you have with your health care provider.  Document Released: 10/13/2009 Document Revised: 01/25/2018 Document Reviewed: 01/25/2018  [employer]vier Interactive Patient Education  2019  [employer]

## 2018-09-12 LAB — PATHOLOGY

## 2018-09-17 MED FILL — busPIRone HCL 10 MG TABS: 10 | 90 days supply | Qty: 180 | Fill #1

## 2018-09-18 MED FILL — SHIPPING COST: 1 days supply | Qty: 1 | Fill #0

## 2018-09-22 ENCOUNTER — Ambulatory Visit: Payer: No Typology Code available for payment source | Admitting: Family Medicine

## 2018-09-25 MED FILL — LOSARTAN-HCTZ 100-25 MG TAB: 100-25 | 30 days supply | Qty: 30 | Fill #2

## 2018-09-25 MED FILL — SHIPPING COST: 1 days supply | Qty: 1 | Fill #1

## 2018-09-26 ENCOUNTER — Other Ambulatory Visit: Payer: Self-pay | Admitting: *Deleted

## 2018-09-26 MED ORDER — RISPERIDONE 0.5 MG PO TABS: 0.5000 mg | ORAL_TABLET | Freq: Every day | ORAL | 1 refills | Status: DC

## 2018-09-26 MED FILL — risperiDONE 0.5 MG TABS: 0.5 | 90 days supply | Qty: 90 | Fill #0

## 2018-09-26 MED FILL — SHIPPING COST: 1 days supply | Qty: 1 | Fill #2

## 2018-10-01 MED FILL — DULoxetine HCL 30 MG CPEP: 30 | 90 days supply | Qty: 180 | Fill #1

## 2018-10-02 MED FILL — SHIPPING COST: 1 days supply | Qty: 1 | Fill #3

## 2018-10-11 ENCOUNTER — Other Ambulatory Visit: Payer: Self-pay | Admitting: Physician Assistant

## 2018-10-24 ENCOUNTER — Other Ambulatory Visit: Payer: Self-pay | Admitting: Physician Assistant

## 2018-10-24 MED FILL — busPIRone HCL 15 MG TABS: 15 | 20 days supply | Qty: 60 | Fill #1

## 2018-10-24 MED FILL — VASCEPA 1 GM CAPSULE: 1 | 90 days supply | Qty: 360 | Fill #1

## 2018-10-24 MED FILL — METOPROLOL TARTRATE 25 MG T: 25 | 90 days supply | Qty: 180 | Fill #0

## 2018-10-24 MED FILL — ROSUVASTATIN CALCIUM 10 MG: 10 | 90 days supply | Qty: 90 | Fill #1

## 2018-10-24 MED FILL — LOSARTAN-HCTZ 100-25 MG TAB: 100-25 | 30 days supply | Qty: 30 | Fill #3

## 2018-11-27 MED FILL — LOSARTAN-HCTZ 100-25 MG TAB: 100-25 | 30 days supply | Qty: 30 | Fill #4

## 2018-11-27 MED FILL — VIT D2 1.25 MG (50,000 UNIT: 1.25 MG | 84 days supply | Qty: 12 | Fill #1

## 2018-11-27 MED FILL — busPIRone HCL 15 MG TABS: 15 | 20 days supply | Qty: 60 | Fill #2

## 2018-11-29 ENCOUNTER — Ambulatory Visit (INDEPENDENT_AMBULATORY_CARE_PROVIDER_SITE_OTHER): Payer: No Typology Code available for payment source | Admitting: Physician Assistant

## 2018-11-29 ENCOUNTER — Other Ambulatory Visit: Payer: Self-pay

## 2018-11-29 ENCOUNTER — Encounter: Payer: Self-pay | Admitting: Physician Assistant

## 2018-11-29 VITALS — BP 124/71 | HR 64 | Temp 97.8°F | Ht 63.0 in | Wt 197.0 lb

## 2018-11-29 DIAGNOSIS — F411 Generalized anxiety disorder: Secondary | ICD-10-CM | POA: Diagnosis not present

## 2018-11-29 DIAGNOSIS — F3341 Major depressive disorder, recurrent, in partial remission: Secondary | ICD-10-CM

## 2018-11-29 NOTE — Progress Notes (Signed)
BP 124/71   Pulse 64   Temp 97.8 F (36.6 C) (Oral)   Ht 5\' 3"  (1.6 m)   Wt 197 lb (89.4 kg)   LMP 12/31/2013   BMI 34.90 kg/m    Subjective:    Patient ID: Heather Lam, female    DOB: [birth year], 58 y.o.   MRN: [id number]  HPI: Heather Lam is a 58 y.o. female presenting on 11/29/2018 for Medication Management  Patient comes in to discuss her depression and anxiety.  She states she has had a great improvement in recent weeks.  She would like to taper off of the risperidone if possible.  It was good for the last few months in coming down her mood swings.  However at this time she seems to be doing very good with that.  She is given instructions on how to taper her down over the next couple weeks.  But if symptoms do return we can go back on the medication.  Her anxiety is doing very well on the BuSpar 50 mg.  Most of the time she gets it in twice a day very regularly but sometimes does not get the third dose and during the day.  I have let her know that if she needed 2 in the evening she could take 2 at one time for a total of 30 mg if it was needed.  Past Medical History:  Diagnosis Date  . Anxiety   . Back pain   . CAD (coronary artery disease)   . Chest pain   . Complication of anesthesia   . Constipation   . Diverticulitis   . Diverticulosis   . Heart attack ([place]) 2010  . History of kidney stones   . Hyperlipemia   . Hypertension   . Joint pain   . PONV (postoperative nausea and vomiting)   . Skin cancer    on nose  . Sleep apnea    wears CPAP nightly  . Vitamin D deficiency    Relevant past medical, surgical, family and social history reviewed and updated as indicated. Interim medical history since our last visit reviewed. Allergies and medications reviewed and updated. DATA REVIEWED: CHART IN EPIC  Family History reviewed for pertinent findings.  Review of Systems  Constitutional: Negative.   HENT: Negative.   Eyes: Negative.   Respiratory: Negative.    Gastrointestinal: Negative.   Genitourinary: Negative.     Allergies as of 11/29/2018   No Known Allergies     Medication List       Accurate as of November 29, 2018  8:55 AM. Always use your most recent med list.        ALPRAZolam 0.25 MG tablet Commonly known as:  XANAX Take 1 tablet (0.25 mg total) by mouth daily as needed for anxiety.   aspirin EC 81 MG tablet Take 1 tablet (81 mg total) by mouth daily.   busPIRone 15 MG tablet Commonly known as:  BUSPAR Take 1 tablet (15 mg total) by mouth 3 (three) times daily.   Cholecalciferol 50 MCG (2000 UT) Caps Take 1 capsule (2,000 Units total) by mouth daily.   Co Q10 100 MG Caps Take 1 capsule by mouth daily.   DULoxetine 30 MG capsule Commonly known as:  CYMBALTA Take 2 capsules (60 mg total) by mouth daily.   losartan-hydrochlorothiazide 100-25 MG tablet Commonly known as:  HYZAAR Take 1 tablet by mouth daily.   metoprolol tartrate 25 MG tablet Commonly known as:  LOPRESSOR TAKE 1 TABLET BY MOUTH 2 TIMES DAILY.   nitroGLYCERIN 0.4 MG SL tablet Commonly known as:  Nitrostat 1 TABLET UNDER TONGUE EVERY 5 MINUTES UP TO 3 TIMES FOR CHEST PAIN THEN CALL DR IF NO RELIEF   rosuvastatin 10 MG tablet Commonly known as:  CRESTOR Take 1 tablet (10 mg total) by mouth daily.   traZODone 50 MG tablet Commonly known as:  DESYREL Take 1-2 tablets (50-100 mg total) by mouth at bedtime as needed for sleep.   Vascepa 1 g Caps Generic drug:  Icosapent Ethyl TAKE 2 CAPULES BY MOUTH TWICE DAILY   Vitamin D (Ergocalciferol) 1.25 MG (50000 UT) Caps capsule Commonly known as:  DRISDOL Take 1 capsule (50,000 Units total) by mouth every 7 (seven) days.          Objective:    BP 124/71   Pulse 64   Temp 97.8 F (36.6 C) (Oral)   Ht 5\' 3"  (1.6 m)   Wt 197 lb (89.4 kg)   LMP 12/31/2013   BMI 34.90 kg/m   No Known Allergies  Wt Readings from Last 3 Encounters:  11/29/18 197 lb (89.4 kg)  09/08/18 198 lb (89.8 kg)   08/07/18 198 lb (89.8 kg)    Physical Exam Constitutional:      Appearance: She is well-developed.  HENT:     Head: Normocephalic and atraumatic.  Eyes:     Conjunctiva/sclera: Conjunctivae normal.     Pupils: Pupils are equal, round, and reactive to light.  Cardiovascular:     Rate and Rhythm: Normal rate and regular rhythm.     Heart sounds: Normal heart sounds.  Pulmonary:     Effort: Pulmonary effort is normal.     Breath sounds: Normal breath sounds.  Abdominal:     General: Bowel sounds are normal.     Palpations: Abdomen is soft.  Skin:    General: Skin is warm and dry.     Findings: No rash.  Neurological:     Mental Status: She is alert and oriented to person, place, and time.     Deep Tendon Reflexes: Reflexes are normal and symmetric.  Psychiatric:        Behavior: Behavior normal.        Thought Content: Thought content normal.        Judgment: Judgment normal.         Assessment & Plan:   1. Generalized anxiety disorder Continue BuSpar at 2-3 times daily Continue Cymbalta Continue Xanax on a as needed basis  2. Recurrent major depressive disorder, in partial remission (HCC) Continue Cymbalta Taper off the risperidone over the next couple of weeks.  Patient has been given instructions.   Continue all other maintenance medications as listed above.  Follow up plan: No follow-ups on file.  Educational handout given for [place] PA-C [facility] [address]  [place],  [address] [phone]   11/29/2018, 8:55 AM

## 2018-12-04 MED FILL — traZODone HCL 50 MG TABS: 50 | 45 days supply | Qty: 90 | Fill #1

## 2018-12-19 ENCOUNTER — Encounter: Payer: Self-pay | Admitting: Physician Assistant

## 2018-12-19 ENCOUNTER — Ambulatory Visit (INDEPENDENT_AMBULATORY_CARE_PROVIDER_SITE_OTHER): Payer: No Typology Code available for payment source | Admitting: Physician Assistant

## 2018-12-19 ENCOUNTER — Other Ambulatory Visit: Payer: Self-pay

## 2018-12-19 VITALS — BP 120/65 | HR 72 | Temp 97.4°F | Wt 197.0 lb

## 2018-12-19 DIAGNOSIS — B351 Tinea unguium: Secondary | ICD-10-CM | POA: Diagnosis not present

## 2018-12-19 DIAGNOSIS — B359 Dermatophytosis, unspecified: Secondary | ICD-10-CM

## 2018-12-19 MED ORDER — TERBINAFINE HCL 250 MG PO TABS: 250.0000 mg | ORAL_TABLET | Freq: Every day | ORAL | 5 refills | Status: DC

## 2018-12-19 MED ORDER — ECONAZOLE NITRATE 1 % EX CREA: TOPICAL_CREAM | Freq: Two times a day (BID) | CUTANEOUS | 0 refills | Status: DC

## 2018-12-19 NOTE — Progress Notes (Signed)
BP 120/65   Pulse 72   Temp (!) 97.4 F (36.3 C)   Wt 197 lb (89.4 kg)   LMP 12/31/2013   BMI 34.90 kg/m    Subjective:    Patient ID: Heather Lam, female    DOB: 01/27/1961, 58 y.o.   MRN: [id number]  HPI: Heather Lam is a 58 y.o. female presenting on 12/19/2018 for Rash (on wrist)  Patient comes in with rash on her left wrist.  It is flat and slightly red.  There is some itching.  She tried some old cream that she had at home but nothing was helping.  This is a new finding.  She also is having thickened toenails on the right foot, toes 1 and 2.  She has no severe pain.  No injury.  She notes that she has had issues with fungal nails in the past.  Past Medical History:  Diagnosis Date  . Anxiety   . Back pain   . CAD (coronary artery disease)   . Chest pain   . Complication of anesthesia   . Constipation   . Diverticulitis   . Diverticulosis   . Heart attack ([facility]) 2010  . History of kidney stones   . Hyperlipemia   . Hypertension   . Joint pain   . PONV (postoperative nausea and vomiting)   . Skin cancer    on nose  . Sleep apnea    wears CPAP nightly  . Vitamin D deficiency    Relevant past medical, surgical, family and social history reviewed and updated as indicated. Interim medical history since our last visit reviewed. Allergies and medications reviewed and updated. DATA REVIEWED: CHART IN EPIC  Family History reviewed for pertinent findings.  Review of Systems  Constitutional: Negative.   HENT: Negative.   Eyes: Negative.   Respiratory: Negative.   Gastrointestinal: Negative.   Genitourinary: Negative.   Skin: Positive for color change and rash.    Allergies as of 12/19/2018   No Known Allergies     Medication List       Accurate as of Dec 19, 2018  2:04 PM. If you have any questions, ask your nurse or doctor.        ALPRAZolam 0.25 MG tablet Commonly known as:  XANAX Take 1 tablet (0.25 mg total) by mouth daily as needed for anxiety.    aspirin EC 81 MG tablet Take 1 tablet (81 mg total) by mouth daily.   busPIRone 15 MG tablet Commonly known as:  BUSPAR Take 1 tablet (15 mg total) by mouth 3 (three) times daily.   Cholecalciferol 50 MCG (2000 UT) Caps Take 1 capsule (2,000 Units total) by mouth daily.   Co Q10 100 MG Caps Take 1 capsule by mouth daily.   DULoxetine 30 MG capsule Commonly known as:  CYMBALTA Take 2 capsules (60 mg total) by mouth daily.   econazole nitrate 1 % cream Apply topically 2 (two) times daily. Started by:  Terald Sleeper, PA-C   losartan-hydrochlorothiazide 100-25 MG tablet Commonly known as:  HYZAAR Take 1 tablet by mouth daily.   metoprolol tartrate 25 MG tablet Commonly known as:  LOPRESSOR TAKE 1 TABLET BY MOUTH 2 TIMES DAILY.   nitroGLYCERIN 0.4 MG SL tablet Commonly known as:  Nitrostat 1 TABLET UNDER TONGUE EVERY 5 MINUTES UP TO 3 TIMES FOR CHEST PAIN THEN CALL DR IF NO RELIEF   rosuvastatin 10 MG tablet Commonly known as:  CRESTOR Take 1 tablet (  10 mg total) by mouth daily.   terbinafine 250 MG tablet Commonly known as:  LAMISIL Take 1 tablet (250 mg total) by mouth daily. Started by:  Terald Sleeper, PA-C   traZODone 50 MG tablet Commonly known as:  DESYREL Take 1-2 tablets (50-100 mg total) by mouth at bedtime as needed for sleep.   Vascepa 1 g Caps Generic drug:  Icosapent Ethyl TAKE 2 CAPULES BY MOUTH TWICE DAILY   Vitamin D (Ergocalciferol) 1.25 MG (50000 UT) Caps capsule Commonly known as:  DRISDOL Take 1 capsule (50,000 Units total) by mouth every 7 (seven) days.          Objective:    BP 120/65   Pulse 72   Temp (!) 97.4 F (36.3 C)   Wt 197 lb (89.4 kg)   LMP 12/31/2013   BMI 34.90 kg/m   No Known Allergies  Wt Readings from Last 3 Encounters:  12/19/18 197 lb (89.4 kg)  11/29/18 197 lb (89.4 kg)  09/08/18 198 lb (89.8 kg)    Physical Exam Constitutional:      Appearance: She is well-developed.  HENT:     Head: Normocephalic  and atraumatic.  Eyes:     Conjunctiva/sclera: Conjunctivae normal.     Pupils: Pupils are equal, round, and reactive to light.  Cardiovascular:     Rate and Rhythm: Normal rate and regular rhythm.     Heart sounds: Normal heart sounds.  Pulmonary:     Effort: Pulmonary effort is normal.     Breath sounds: Normal breath sounds.  Abdominal:     General: Bowel sounds are normal.     Palpations: Abdomen is soft.  Skin:    General: Skin is warm and dry.     Findings: Erythema, lesion and rash present. Rash is papular.          Comments: Right toes 1 and 2 with thickened nail, curling of the shape. No surrounding erythema  Neurological:     Mental Status: She is alert and oriented to person, place, and time.     Deep Tendon Reflexes: Reflexes are normal and symmetric.  Psychiatric:        Behavior: Behavior normal.        Thought Content: Thought content normal.        Judgment: Judgment normal.         Assessment & Plan:   1. Tinea unguium - terbinafine (LAMISIL) 250 MG tablet; Take 1 tablet (250 mg total) by mouth daily.  Dispense: 30 tablet; Refill: 5  2. 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  Dispense: 30 g; Refill: 0   Continue all other maintenance medications as listed above.  Follow up plan: Return if symptoms worsen or fail to improve.  Educational handout given for [place] PA-C [facility] [address]  [place], [place] [address] [phone]   12/19/2018, 2:04 PM

## 2018-12-23 MED FILL — busPIRone HCL 15 MG TABS: 15 | 20 days supply | Qty: 60 | Fill #3

## 2018-12-23 MED FILL — LOSARTAN-HCTZ 100-25 MG TAB: 100-25 | 30 days supply | Qty: 30 | Fill #5

## 2019-01-01 ENCOUNTER — Other Ambulatory Visit: Payer: Self-pay | Admitting: Physician Assistant

## 2019-01-01 ENCOUNTER — Other Ambulatory Visit: Payer: Self-pay | Admitting: *Deleted

## 2019-01-01 MED ORDER — DULOXETINE HCL 30 MG PO CPEP: 60.0000 mg | ORAL_CAPSULE | Freq: Every day | ORAL | 1 refills | Status: DC

## 2019-01-01 MED FILL — DULoxetine HCL 30 MG CPEP: 30 | 90 days supply | Qty: 180 | Fill #0

## 2019-01-02 MED FILL — risperiDONE 0.5 MG TABS: 0.5 | 90 days supply | Qty: 90 | Fill #1

## 2019-01-16 MED FILL — LOSARTAN-HCTZ 100-25 MG TAB: 100-25 | 30 days supply | Qty: 30 | Fill #6

## 2019-01-25 ENCOUNTER — Ambulatory Visit (INDEPENDENT_AMBULATORY_CARE_PROVIDER_SITE_OTHER): Payer: No Typology Code available for payment source | Admitting: Family Medicine

## 2019-01-25 ENCOUNTER — Encounter: Payer: Self-pay | Admitting: Family Medicine

## 2019-01-25 ENCOUNTER — Other Ambulatory Visit: Payer: Self-pay

## 2019-01-25 VITALS — BP 128/68 | HR 63 | Temp 98.8°F | Ht 63.0 in | Wt 197.0 lb

## 2019-01-25 DIAGNOSIS — R1032 Left lower quadrant pain: Secondary | ICD-10-CM

## 2019-01-25 DIAGNOSIS — R197 Diarrhea, unspecified: Secondary | ICD-10-CM | POA: Diagnosis not present

## 2019-01-25 LAB — CBC
Hematocrit: 42.2 % (ref 34.0–46.6)
Hemoglobin: 14.3 g/dL (ref 11.1–15.9)
MCH: 28.2 pg (ref 26.6–33.0)
MCHC: 33.9 g/dL (ref 31.5–35.7)
MCV: 83 fL (ref 79–97)
Platelets: 228 10*3/uL (ref 150–450)
RBC: 5.07 x10E6/uL (ref 3.77–5.28)
RDW: 13.5 % (ref 11.7–15.4)
WBC: 17.3 10*3/uL — ABNORMAL HIGH (ref 3.4–10.8)

## 2019-01-25 LAB — CMP14+EGFR
ALT: 13 IU/L (ref 0–32)
AST: 15 IU/L (ref 0–40)
Albumin/Globulin Ratio: 1.4 (ref 1.2–2.2)
Albumin: 4 g/dL (ref 3.8–4.9)
Alkaline Phosphatase: 74 IU/L (ref 39–117)
BUN/Creatinine Ratio: 11 (ref 9–23)
BUN: 8 mg/dL (ref 6–24)
Bilirubin Total: 0.4 mg/dL (ref 0.0–1.2)
CO2: 26 mmol/L (ref 20–29)
Calcium: 9.6 mg/dL (ref 8.7–10.2)
Chloride: 96 mmol/L (ref 96–106)
Creatinine, Ser: 0.76 mg/dL (ref 0.57–1.00)
GFR calc Af Amer: 100 mL/min/{1.73_m2} (ref >59)
GFR calc non Af Amer: 87 mL/min/{1.73_m2} (ref >59)
Globulin, Total: 2.8 g/dL (ref 1.5–4.5)
Glucose: 100 mg/dL — ABNORMAL HIGH (ref 65–99)
Potassium: 3.9 mmol/L (ref 3.5–5.2)
Sodium: 140 mmol/L (ref 134–144)
Total Protein: 6.8 g/dL (ref 6.0–8.5)

## 2019-01-25 MED ORDER — AMOXICILLIN-POT CLAVULANATE 875-125 MG PO TABS: 1.0000 | ORAL_TABLET | Freq: Two times a day (BID) | ORAL | 0 refills | Status: AC

## 2019-01-25 MED ORDER — METRONIDAZOLE 500 MG PO TABS: 500.0000 mg | ORAL_TABLET | Freq: Three times a day (TID) | ORAL | 0 refills | Status: DC

## 2019-01-25 MED ORDER — FLUCONAZOLE 150 MG PO TABS: 150.0000 mg | ORAL_TABLET | Freq: Once | ORAL | 0 refills | Status: AC

## 2019-01-25 NOTE — Patient Instructions (Signed)
You had labs performed today.  You will be contacted with the results of the labs once they are available, usually in the next 3 business days for routine lab work.  If you have an active my chart account, they will be released to your MyChart.  If you prefer to have these labs released to you via telephone, please let us know.  If you had a pap smear or biopsy performed, expect to be contacted in about 7-10 days.   Diverticulitis  Diverticulitis is when small pockets in your large intestine (colon) get infected or swollen. This causes stomach pain and watery poop (diarrhea). These pouches are called diverticula. They form in people who have a condition called diverticulosis. Follow these instructions at home: Medicines  Take over-the-counter and prescription medicines only as told by your doctor. These include: ? Antibiotics. ? Pain medicines. ? Fiber pills. ? Probiotics. ? Stool softeners.  Do not drive or use heavy machinery while taking prescription pain medicine.  If you were prescribed an antibiotic, take it as told. Do not stop taking it even if you feel better. General instructions   Follow a diet as told by your doctor.  When you feel better, your doctor may tell you to change your diet. You may need to eat a lot of fiber. Fiber makes it easier to poop (have bowel movements). Healthy foods with fiber include: ? Berries. ? Beans. ? Lentils. ? Green vegetables.  Exercise 3 or more times a week. Aim for 30 minutes each time. Exercise enough to sweat and make your heart beat faster.  Keep all follow-up visits as told. This is important. You may need to have an exam of the large intestine. This is called a colonoscopy. Contact a doctor if:  Your pain does not get better.  You have a hard time eating or drinking.  You are not pooping like normal. Get help right away if:  Your pain gets worse.  Your problems do not get better.  Your problems get worse very fast.   You have a fever.  You throw up (vomit) more than one time.  You have poop that is: ? Bloody. ? Black. ? Tarry. Summary  Diverticulitis is when small pockets in your large intestine (colon) get infected or swollen.  Take medicines only as told by your doctor.  Follow a diet as told by your doctor. This information is not intended to replace advice given to you by your health care provider. Make sure you discuss any questions you have with your health care provider. Document Released: 01/05/2008 Document Revised: 08/05/2016 Document Reviewed: 08/05/2016 Elsevier Interactive Patient Education  2019 [employer].

## 2019-01-25 NOTE — Progress Notes (Signed)
Subjective: CC: abdominal pain PCP: Terald Sleeper, PA-C [id number] Heather Lam is a 58 y.o. female presenting to clinic today for:  1.  Abdominal pain Patient reports onset of generalized lower abdominal pain over the weekend.  She reports associated diarrhea that is nonbloody and abdominal bloating.  She has had some intermittent nausea episodes but feels that this is associated with intermittent pain episodes.  She has had approximately 7 episodes of small bouts of diarrhea per day.  Denies any consumption of undercooked foods, foods left out too long or untreated water.  No known sick contacts.  No fevers but she has had some chills at nighttime.  Denies any upper respiratory symptoms including cough, congestion, shortness of breath or rhinorrhea.  She has not taken any medications to help with symptoms.  She has had history of diverticulitis in the past which responded well to outpatient therapies.   ROS: Per HPI  No Known Allergies Past Medical History:  Diagnosis Date  . Anxiety   . Back pain   . CAD (coronary artery disease)   . Chest pain   . Complication of anesthesia   . Constipation   . Diverticulitis   . Diverticulosis   . Heart attack ([facility]) 2010  . History of kidney stones   . Hyperlipemia   . Hypertension   . Joint pain   . PONV (postoperative nausea and vomiting)   . Skin cancer    on nose  . Sleep apnea    wears CPAP nightly  . Vitamin D deficiency     Current Outpatient Medications:  .  ALPRAZolam (XANAX) 0.25 MG tablet, Take 1 tablet (0.25 mg total) by mouth daily as needed for anxiety., Disp: 30 tablet, Rfl: 0 .  aspirin EC 81 MG tablet, Take 1 tablet (81 mg total) by mouth daily., Disp: 90 tablet, Rfl: 3 .  busPIRone (BUSPAR) 15 MG tablet, Take 1 tablet (15 mg total) by mouth 3 (three) times daily., Disp: 60 tablet, Rfl: 5 .  Cholecalciferol 2000 units CAPS, Take 1 capsule (2,000 Units total) by mouth daily., Disp: 30 each, Rfl:  .  Coenzyme Q10 (CO Q10)  100 MG CAPS, Take 1 capsule by mouth daily., Disp: , Rfl:  .  DULoxetine (CYMBALTA) 30 MG capsule, TAKE 2 CAPSULES (60 MG TOTAL) BY MOUTH DAILY., Disp: 180 capsule, Rfl: 1 .  DULoxetine (CYMBALTA) 30 MG capsule, Take 2 capsules (60 mg total) by mouth daily., Disp: 180 capsule, Rfl: 1 .  econazole nitrate 1 % cream, Apply topically 2 (two) times daily., Disp: 30 g, Rfl: 0 .  losartan-hydrochlorothiazide (HYZAAR) 100-25 MG tablet, Take 1 tablet by mouth daily., Disp: 90 tablet, Rfl: 2 .  metoprolol tartrate (LOPRESSOR) 25 MG tablet, TAKE 1 TABLET BY MOUTH 2 TIMES DAILY., Disp: 180 tablet, Rfl: 3 .  nitroGLYCERIN (NITROSTAT) 0.4 MG SL tablet, 1 TABLET UNDER TONGUE EVERY 5 MINUTES UP TO 3 TIMES FOR CHEST PAIN THEN CALL DR IF NO RELIEF, Disp: 25 tablet, Rfl: 3 .  rosuvastatin (CRESTOR) 10 MG tablet, Take 1 tablet (10 mg total) by mouth daily., Disp: 90 tablet, Rfl: 1 .  terbinafine (LAMISIL) 250 MG tablet, Take 1 tablet (250 mg total) by mouth daily., Disp: 30 tablet, Rfl: 5 .  traZODone (DESYREL) 50 MG tablet, Take 1-2 tablets (50-100 mg total) by mouth at bedtime as needed for sleep., Disp: 90 tablet, Rfl: 3 .  VASCEPA 1 g CAPS, TAKE 2 CAPULES BY MOUTH TWICE DAILY, Disp: 360 capsule,  Rfl: 2 .  Vitamin D, Ergocalciferol, (DRISDOL) 1.25 MG (50000 UT) CAPS capsule, Take 1 capsule (50,000 Units total) by mouth every 7 (seven) days., Disp: 12 capsule, Rfl: 3 Social History   Socioeconomic History  . Marital status: Married    Spouse name: Not on file  . Number of children: Not on file  . Years of education: Not on file  . Highest education level: Not on file  Occupational History  . Occupation: Optician, dispensing: [employer]  . Financial resource strain: Not on file  . Food insecurity    Worry: Not on file    Inability: Not on file  . Transportation needs    Medical: Not on file    Non-medical: Not on file  Tobacco Use  . Smoking status: Former Research scientist (life sciences)  . Smokeless tobacco:  Never Used  Substance and Sexual Activity  . Alcohol use: Yes    Comment: Rare   . Drug use: No  . Sexual activity: Yes  Lifestyle  . Physical activity    Days per week: Not on file    Minutes per session: Not on file  . Stress: Not on file  Relationships  . Social Herbalist on phone: Not on file    Gets together: Not on file    Attends religious service: Not on file    Active member of club or organization: Not on file    Attends meetings of clubs or organizations: Not on file    Relationship status: Not on file  . Intimate partner violence    Fear of current or ex partner: Not on file    Emotionally abused: Not on file    Physically abused: Not on file    Forced sexual activity: Not on file  Other Topics Concern  . Not on file  Social History Narrative   Caffeine daily    Family History  Problem Relation Age of Onset  . Hyperlipidemia Mother   . Hypertension Mother   . Cancer Father        skin  . Hyperlipidemia Father   . Dementia Father   . Arthritis Father   . Hypertension Father   . Sleep apnea Father   . Other Sister        Myelofibrosis  . Arthritis Sister   . Hyperlipidemia Sister   . Arthritis Sister   . Hyperlipidemia Sister   . Diabetes Paternal Grandfather   . Colon cancer Neg Hx     Objective: Office vital signs reviewed. BP 128/68   Pulse 63   Temp 98.8 F (37.1 C) (Oral)   Ht 5' 3"  (1.6 m)   Wt 197 lb (89.4 kg)   LMP 12/31/2013   BMI 34.90 kg/m   Physical Examination:  General: Awake, alert, well nourished, nontoxic. No acute distress HEENT: Normal, sclera white, MMM Cardio: regular rate Pulm: normal work of breathing on room air GI: soft, mildly distended. bowel sounds present and hyperactive x4, no hepatomegaly, no splenomegaly, no masses; +TTP to LLQ  Assessment/ Plan: 58 y.o. female   1. LLQ abdominal pain Very suggestive of diverticulitis.  I have ordered a CMP and CBC to further evaluate.  Empiric treatment with  Augmentin and Flagyl prescribed.  Advised against alcohol use with Flagyl.  We discussed red flag signs and symptoms.  She voiced good understanding will follow-up PRN. - CMP14+EGFR - CBC - amoxicillin-clavulanate (AUGMENTIN) 875-125 MG tablet; Take 1 tablet by  mouth 2 (two) times daily for 7 days.  Dispense: 14 tablet; Refill: 0 - metroNIDAZOLE (FLAGYL) 500 MG tablet; Take 1 tablet (500 mg total) by mouth 3 (three) times daily.  Dispense: 21 tablet; Refill: 0  2. Diarrhea, unspecified type We had a frank discussion that GI symptoms can in fact be symptoms of COVID-19 infection.  If symptoms are unresponsive to above treatment may need to consider outpatient COVID-19 testing. - CMP14+EGFR - CBC - amoxicillin-clavulanate (AUGMENTIN) 875-125 MG tablet; Take 1 tablet by mouth 2 (two) times daily for 7 days.  Dispense: 14 tablet; Refill: 0 - metroNIDAZOLE (FLAGYL) 500 MG tablet; Take 1 tablet (500 mg total) by mouth 3 (three) times daily.  Dispense: 21 tablet; Refill: 0   No orders of the defined types were placed in this encounter.  No orders of the defined types were placed in this encounter.    [name], DO [facility] [phone]

## 2019-01-26 ENCOUNTER — Other Ambulatory Visit: Payer: Self-pay

## 2019-01-26 ENCOUNTER — Inpatient Hospital Stay (HOSPITAL_COMMUNITY)
Admission: EM | Admit: 2019-01-26 | Discharge: 2019-02-14 | DRG: 329 | Disposition: A | Discharge status: Home Health | Payer: No Typology Code available for payment source | Attending: Internal Medicine | Admitting: Internal Medicine

## 2019-01-26 ENCOUNTER — Encounter (HOSPITAL_COMMUNITY): Payer: Self-pay | Admitting: *Deleted

## 2019-01-26 ENCOUNTER — Emergency Department (HOSPITAL_COMMUNITY): Payer: No Typology Code available for payment source

## 2019-01-26 DIAGNOSIS — F3341 Major depressive disorder, recurrent, in partial remission: Secondary | ICD-10-CM | POA: Diagnosis present

## 2019-01-26 DIAGNOSIS — E785 Hyperlipidemia, unspecified: Secondary | ICD-10-CM | POA: Diagnosis present

## 2019-01-26 DIAGNOSIS — F32A Depression, unspecified: Secondary | ICD-10-CM | POA: Diagnosis present

## 2019-01-26 DIAGNOSIS — K651 Peritoneal abscess: Secondary | ICD-10-CM | POA: Diagnosis present

## 2019-01-26 DIAGNOSIS — F4320 Adjustment disorder, unspecified: Secondary | ICD-10-CM | POA: Diagnosis present

## 2019-01-26 DIAGNOSIS — K5792 Diverticulitis of intestine, part unspecified, without perforation or abscess without bleeding: Secondary | ICD-10-CM | POA: Diagnosis present

## 2019-01-26 DIAGNOSIS — I2510 Atherosclerotic heart disease of native coronary artery without angina pectoris: Secondary | ICD-10-CM | POA: Diagnosis present

## 2019-01-26 DIAGNOSIS — E876 Hypokalemia: Secondary | ICD-10-CM | POA: Diagnosis not present

## 2019-01-26 DIAGNOSIS — L0291 Cutaneous abscess, unspecified: Secondary | ICD-10-CM

## 2019-01-26 DIAGNOSIS — K5720 Diverticulitis of large intestine with perforation and abscess without bleeding: Secondary | ICD-10-CM | POA: Diagnosis not present

## 2019-01-26 DIAGNOSIS — G4700 Insomnia, unspecified: Secondary | ICD-10-CM | POA: Diagnosis present

## 2019-01-26 DIAGNOSIS — Z8249 Family history of ischemic heart disease and other diseases of the circulatory system: Secondary | ICD-10-CM

## 2019-01-26 DIAGNOSIS — M7989 Other specified soft tissue disorders: Secondary | ICD-10-CM | POA: Diagnosis not present

## 2019-01-26 DIAGNOSIS — B954 Other streptococcus as the cause of diseases classified elsewhere: Secondary | ICD-10-CM | POA: Diagnosis present

## 2019-01-26 DIAGNOSIS — Z955 Presence of coronary angioplasty implant and graft: Secondary | ICD-10-CM

## 2019-01-26 DIAGNOSIS — F329 Major depressive disorder, single episode, unspecified: Secondary | ICD-10-CM | POA: Diagnosis present

## 2019-01-26 DIAGNOSIS — Z6835 Body mass index (BMI) 35.0-35.9, adult: Secondary | ICD-10-CM

## 2019-01-26 DIAGNOSIS — N74 Female pelvic inflammatory disorders in diseases classified elsewhere: Secondary | ICD-10-CM | POA: Diagnosis present

## 2019-01-26 DIAGNOSIS — Z87891 Personal history of nicotine dependence: Secondary | ICD-10-CM

## 2019-01-26 DIAGNOSIS — R1032 Left lower quadrant pain: Secondary | ICD-10-CM | POA: Diagnosis present

## 2019-01-26 DIAGNOSIS — E669 Obesity, unspecified: Secondary | ICD-10-CM | POA: Diagnosis present

## 2019-01-26 DIAGNOSIS — R188 Other ascites: Secondary | ICD-10-CM

## 2019-01-26 DIAGNOSIS — I10 Essential (primary) hypertension: Secondary | ICD-10-CM | POA: Diagnosis present

## 2019-01-26 DIAGNOSIS — B9620 Unspecified Escherichia coli [E. coli] as the cause of diseases classified elsewhere: Secondary | ICD-10-CM | POA: Diagnosis present

## 2019-01-26 DIAGNOSIS — Z833 Family history of diabetes mellitus: Secondary | ICD-10-CM

## 2019-01-26 DIAGNOSIS — I110 Hypertensive heart disease with heart failure: Secondary | ICD-10-CM | POA: Diagnosis present

## 2019-01-26 DIAGNOSIS — I5032 Chronic diastolic (congestive) heart failure: Secondary | ICD-10-CM | POA: Diagnosis present

## 2019-01-26 DIAGNOSIS — E559 Vitamin D deficiency, unspecified: Secondary | ICD-10-CM | POA: Diagnosis present

## 2019-01-26 DIAGNOSIS — K660 Peritoneal adhesions (postprocedural) (postinfection): Secondary | ICD-10-CM | POA: Diagnosis present

## 2019-01-26 DIAGNOSIS — I252 Old myocardial infarction: Secondary | ICD-10-CM

## 2019-01-26 DIAGNOSIS — Z7982 Long term (current) use of aspirin: Secondary | ICD-10-CM

## 2019-01-26 DIAGNOSIS — K420 Umbilical hernia with obstruction, without gangrene: Secondary | ICD-10-CM | POA: Diagnosis present

## 2019-01-26 DIAGNOSIS — K650 Generalized (acute) peritonitis: Secondary | ICD-10-CM | POA: Diagnosis present

## 2019-01-26 DIAGNOSIS — R7303 Prediabetes: Secondary | ICD-10-CM | POA: Diagnosis present

## 2019-01-26 DIAGNOSIS — R197 Diarrhea, unspecified: Secondary | ICD-10-CM

## 2019-01-26 DIAGNOSIS — F411 Generalized anxiety disorder: Secondary | ICD-10-CM | POA: Diagnosis present

## 2019-01-26 DIAGNOSIS — N200 Calculus of kidney: Secondary | ICD-10-CM | POA: Diagnosis present

## 2019-01-26 DIAGNOSIS — G4733 Obstructive sleep apnea (adult) (pediatric): Secondary | ICD-10-CM

## 2019-01-26 DIAGNOSIS — Z8349 Family history of other endocrine, nutritional and metabolic diseases: Secondary | ICD-10-CM

## 2019-01-26 DIAGNOSIS — Z1159 Encounter for screening for other viral diseases: Secondary | ICD-10-CM

## 2019-01-26 LAB — CBC WITH DIFFERENTIAL/PLATELET
Abs Immature Granulocytes: 0.04 10*3/uL (ref 0.00–0.07)
Basophils Absolute: 0.1 10*3/uL (ref 0.0–0.1)
Basophils Relative: 1 %
Eosinophils Absolute: 0 10*3/uL (ref 0.0–0.5)
Eosinophils Relative: 0 %
HCT: 41.9 % (ref 36.0–46.0)
Hemoglobin: 14.1 g/dL (ref 12.0–15.0)
Immature Granulocytes: 0 %
Lymphocytes Relative: 7 %
Lymphs Abs: 0.8 10*3/uL (ref 0.7–4.0)
MCH: 28.5 pg (ref 26.0–34.0)
MCHC: 33.7 g/dL (ref 30.0–36.0)
MCV: 84.8 fL (ref 80.0–100.0)
Monocytes Absolute: 0.4 10*3/uL (ref 0.1–1.0)
Monocytes Relative: 3 %
Neutro Abs: 10.2 10*3/uL — ABNORMAL HIGH (ref 1.7–7.7)
Neutrophils Relative %: 89 %
Platelets: 214 10*3/uL (ref 150–400)
RBC: 4.94 MIL/uL (ref 3.87–5.11)
RDW: 13.1 % (ref 11.5–15.5)
WBC: 11.5 10*3/uL — ABNORMAL HIGH (ref 4.0–10.5)
nRBC: 0 % (ref 0.0–0.2)

## 2019-01-26 LAB — COMPREHENSIVE METABOLIC PANEL
ALT: 14 U/L (ref 0–44)
AST: 14 U/L — ABNORMAL LOW (ref 15–41)
Albumin: 3.5 g/dL (ref 3.5–5.0)
Alkaline Phosphatase: 69 U/L (ref 38–126)
Anion gap: 11 (ref 5–15)
BUN: 11 mg/dL (ref 6–20)
CO2: 24 mmol/L (ref 22–32)
Calcium: 8.5 mg/dL — ABNORMAL LOW (ref 8.9–10.3)
Chloride: 97 mmol/L — ABNORMAL LOW (ref 98–111)
Creatinine, Ser: 0.58 mg/dL (ref 0.44–1.00)
GFR calc Af Amer: >60 mL/min (ref >60)
GFR calc non Af Amer: >60 mL/min (ref >60)
Glucose, Bld: 134 mg/dL — ABNORMAL HIGH (ref 70–99)
Potassium: 2.7 mmol/L — CL (ref 3.5–5.1)
Sodium: 132 mmol/L — ABNORMAL LOW (ref 135–145)
Total Bilirubin: 0.6 mg/dL (ref 0.3–1.2)
Total Protein: 7.4 g/dL (ref 6.5–8.1)

## 2019-01-26 LAB — LACTIC ACID, PLASMA
Lactic Acid, Venous: 1 mmol/L (ref 0.5–1.9)
Lactic Acid, Venous: 1 mmol/L (ref 0.5–1.9)

## 2019-01-26 LAB — MAGNESIUM: Magnesium: 1.6 mg/dL — ABNORMAL LOW (ref 1.7–2.4)

## 2019-01-26 LAB — PROTIME-INR
INR: 1 (ref 0.8–1.2)
Prothrombin Time: 13 seconds (ref 11.4–15.2)

## 2019-01-26 LAB — LIPASE, BLOOD: Lipase: 26 U/L (ref 11–51)

## 2019-01-26 MED ORDER — MORPHINE SULFATE (PF) 4 MG/ML IV SOLN
4.0000 mg | Freq: Once | INTRAVENOUS | Status: AC
  Administered 2019-01-26: 4 mg via INTRAVENOUS
  Filled 2019-01-26: qty 1

## 2019-01-26 MED ORDER — MAGNESIUM SULFATE 2 GM/50ML IV SOLN
2.0000 g | Freq: Once | INTRAVENOUS | Status: AC
  Administered 2019-01-26: 2 g via INTRAVENOUS
  Filled 2019-01-26: qty 50

## 2019-01-26 MED ORDER — HYDROMORPHONE HCL 1 MG/ML IJ SOLN
1.0000 mg | Freq: Once | INTRAMUSCULAR | Status: AC
  Administered 2019-01-26: 1 mg via INTRAVENOUS
  Filled 2019-01-26: qty 1

## 2019-01-26 MED ORDER — SODIUM CHLORIDE 0.9 % IV BOLUS
1000.0000 mL | Freq: Once | INTRAVENOUS | Status: AC
  Administered 2019-01-26: 1000 mL via INTRAVENOUS

## 2019-01-26 MED ORDER — IOHEXOL 300 MG/ML  SOLN
100.0000 mL | Freq: Once | INTRAMUSCULAR | Status: AC | PRN
  Administered 2019-01-26: 100 mL via INTRAVENOUS

## 2019-01-26 MED ORDER — SODIUM CHLORIDE 0.9 % IV SOLN
INTRAVENOUS | Status: DC
  Administered 2019-01-26: 23:00:00 via INTRAVENOUS

## 2019-01-26 MED ORDER — CIPROFLOXACIN IN D5W 400 MG/200ML IV SOLN
400.0000 mg | Freq: Once | INTRAVENOUS | Status: AC
  Administered 2019-01-26: 400 mg via INTRAVENOUS
  Filled 2019-01-26: qty 200

## 2019-01-26 MED ORDER — METRONIDAZOLE IN NACL 5-0.79 MG/ML-% IV SOLN
500.0000 mg | Freq: Once | INTRAVENOUS | Status: AC
  Administered 2019-01-26: 500 mg via INTRAVENOUS
  Filled 2019-01-26: qty 100

## 2019-01-26 MED ORDER — POTASSIUM CHLORIDE 10 MEQ/100ML IV SOLN
10.0000 meq | INTRAVENOUS | Status: AC
  Administered 2019-01-26 – 2019-01-27 (×2): 10 meq via INTRAVENOUS
  Filled 2019-01-26 (×2): qty 100

## 2019-01-26 MED ORDER — ONDANSETRON HCL 4 MG/2ML IJ SOLN
4.0000 mg | Freq: Once | INTRAMUSCULAR | Status: AC
  Administered 2019-01-26: 4 mg via INTRAVENOUS
  Filled 2019-01-26: qty 2

## 2019-01-26 NOTE — ED Triage Notes (Signed)
Pt c/o n/v, diarrhea, abd pain that started yesterday, was seen at pcp and diagnosed with diverticulitis, started antibiotics yesterday but pain became worse today,

## 2019-01-26 NOTE — ED Notes (Signed)
Patient transported to CT 

## 2019-01-26 NOTE — ED Provider Notes (Signed)
[facility] EMERGENCY DEPARTMENT Provider Note   CSN: [id number] Arrival date & time: 01/26/19  0932    History   Chief Complaint Chief Complaint  Patient presents with  . Abdominal Pain    HPI Heather Lam is a 58 y.o. female.     Heather Lam is a 58 y.o. female with a history of CAD, diverticulitis, constipation, kidney stones, hypertension, hyperlipidemia, who presents to the emergency department for evaluation of worsening lower abdominal pain.  She was seen by her PCP yesterday and diagnosed clinically with diverticulitis.  For the past 5 days she has been experiencing lower abdominal pain, worse on the left than right.  She also reports nausea and vomiting and she has had numerous episodes of nonbloody diarrhea.  She reports chills but has not measured fevers at home, low-grade fever on arrival.  She started taking Augmentin and Flagyl yesterday, has not been taking anything for pain or nausea.  Reports she tried to go to work today, but this afternoon pain significantly worsened, her abdomen has become increasingly distended and bloated.  She reports prior episodes of diverticulitis but none with pain this severe.  No previous abdominal surgeries aside from tubal ligation and endometrial ablation.  No urinary symptoms.  No other aggravating or alleviating factors.     Past Medical History:  Diagnosis Date  . Anxiety   . Back pain   . CAD (coronary artery disease)   . Chest pain   . Complication of anesthesia   . Constipation   . Diverticulitis   . Diverticulosis   . Heart attack ([place]) 2010  . History of kidney stones   . Hyperlipemia   . Hypertension   . Joint pain   . PONV (postoperative nausea and vomiting)   . Skin cancer    on nose  . Sleep apnea    wears CPAP nightly  . Vitamin D deficiency     Patient Active Problem List   Diagnosis Date Noted  . Actinic keratoses 05/04/2017  . History of MI (myocardial infarction) 04/01/2017  . Class 1 obesity  without serious comorbidity with body mass index (BMI) of 34.0 to 34.9 in adult 11/03/2016  . Depression 09/28/2016  . Prediabetes 09/28/2016  . Vitamin D deficiency 08/16/2016  . Recurrent major depressive disorder, in partial remission ([place]) 05/25/2016  . Adjustment disorder 05/25/2016  . Myalgia 05/25/2016  . Carpal tunnel syndrome 02/26/2016  . Obesity (BMI 30-39.9) 02/25/2016  . Insomnia 07/01/2015  . Essential hypertension 05/21/2015  . Generalized anxiety disorder 06/06/2013  . CAD S/P percutaneous coronary angioplasty 09/13/2011  . Antiplatelet or antithrombotic long-term use 09/13/2011  . Diverticulitis large intestine 09/13/2011  . OSA on CPAP 09/13/2011  . Hyperlipidemia 09/13/2011    Past Surgical History:  Procedure Laterality Date  . APPENDECTOMY    . BACK SURGERY    . CARPAL TUNNEL RELEASE Right   . CARPAL TUNNEL RELEASE Left 08/13/2016   Procedure: LEFT CARPAL TUNNEL RELEASE;  Surgeon: Roseanne Kaufman, MD;  Location: [facility];  Service: Orthopedics;  Laterality: Left;  . CORONARY STENT PLACEMENT     2 stents in the same artery  . ENDOMETRIAL ABLATION  2005  . EXTRACORPOREAL SHOCK WAVE LITHOTRIPSY Right 06/27/2017   Procedure: RIGHT EXTRACORPOREAL SHOCK WAVE LITHOTRIPSY (ESWL);  Surgeon: Franchot Gallo, MD;  Location: [facility];  Service: Urology;  Laterality: Right;  . MOHS SURGERY    . TUBAL LIGATION  1991     OB History  Gravida  2   Para  2   Term      Preterm      AB      Living  2     SAB      TAB      Ectopic      Multiple      Live Births               Home Medications    Prior to Admission medications   Medication Sig Start Date End Date Taking? Authorizing Provider  ALPRAZolam (XANAX) 0.25 MG tablet Take 1 tablet (0.25 mg total) by mouth daily as needed for anxiety. 08/07/18  Yes Terald Sleeper, PA-C  amoxicillin-clavulanate (AUGMENTIN) 875-125 MG tablet Take 1 tablet by mouth 2 (two) times daily for 7  days. 01/25/19 02/01/19 Yes Gottschalk, Leatrice Jewels M, DO  aspirin EC 325 MG tablet Take 325 mg by mouth daily.   Yes [provider]  busPIRone (BUSPAR) 15 MG tablet Take 1 tablet (15 mg total) by mouth 3 (three) times daily. 08/07/18  Yes Terald Sleeper, PA-C  Cholecalciferol 2000 units CAPS Take 1 capsule (2,000 Units total) by mouth daily. 08/17/16  Yes Beasley, Caren D, MD  Coenzyme Q10 (CO Q10) 100 MG CAPS Take 1 capsule by mouth daily.   Yes [provider]  DULoxetine (CYMBALTA) 30 MG capsule Take 2 capsules (60 mg total) by mouth daily. 01/01/19  Yes Terald Sleeper, PA-C  losartan-hydrochlorothiazide (HYZAAR) 100-25 MG tablet Take 1 tablet by mouth daily. 07/24/18  Yes Terald Sleeper, PA-C  metoprolol tartrate (LOPRESSOR) 25 MG tablet TAKE 1 TABLET BY MOUTH 2 TIMES DAILY. Patient taking differently: Take 25 mg by mouth 2 (two) times daily.  10/24/18  Yes Terald Sleeper, PA-C  metroNIDAZOLE (FLAGYL) 500 MG tablet Take 1 tablet (500 mg total) by mouth 3 (three) times daily. 01/25/19  Yes Gottschalk, Ashly M, DO  nitroGLYCERIN (NITROSTAT) 0.4 MG SL tablet 1 TABLET UNDER TONGUE EVERY 5 MINUTES UP TO 3 TIMES FOR CHEST PAIN THEN CALL DR IF NO RELIEF Patient taking differently: Place 0.4 mg under the tongue every 5 (five) minutes as needed for chest pain. 1 TABLET UNDER TONGUE EVERY 5 MINUTES UP TO 3 TIMES FOR CHEST PAIN THEN CALL DR IF NO RELIEF 02/23/18  Yes Terald Sleeper, PA-C  rosuvastatin (CRESTOR) 10 MG tablet Take 1 tablet (10 mg total) by mouth daily. 07/03/18  Yes Terald Sleeper, PA-C  terbinafine (LAMISIL) 250 MG tablet Take 1 tablet (250 mg total) by mouth daily. 12/19/18  Yes Terald Sleeper, PA-C  traZODone (DESYREL) 50 MG tablet Take 1-2 tablets (50-100 mg total) by mouth at bedtime as needed for sleep. 05/03/18  Yes Jones, Angel S, PA-C  VASCEPA 1 g CAPS TAKE 2 CAPULES BY MOUTH TWICE DAILY Patient taking differently: Take 2 capsules by mouth 2 (two) times a day.  07/11/18  Yes Terald Sleeper, PA-C  Vitamin D, Ergocalciferol, (DRISDOL) 1.25 MG (50000 UT) CAPS capsule Take 1 capsule (50,000 Units total) by mouth every 7 (seven) days. Patient taking differently: Take 50,000 Units by mouth every Sunday.  08/30/18  Yes Terald Sleeper, PA-C  econazole nitrate 1 % cream Apply topically 2 (two) times daily. 12/19/18   Terald Sleeper, PA-C    Family History Family History  Problem Relation Age of Onset  . Hyperlipidemia Mother   . Hypertension Mother   . Cancer Father  skin  . Hyperlipidemia Father   . Dementia Father   . Arthritis Father   . Hypertension Father   . Sleep apnea Father   . Other Sister        Myelofibrosis  . Arthritis Sister   . Hyperlipidemia Sister   . Arthritis Sister   . Hyperlipidemia Sister   . Diabetes Paternal Grandfather   . Colon cancer Neg Hx     Social History Social History   Tobacco Use  . Smoking status: Former Research scientist (life sciences)  . Smokeless tobacco: Never Used  Substance Use Topics  . Alcohol use: Yes    Comment: Rare   . Drug use: No     Allergies   Patient has no known allergies.   Review of Systems Review of Systems  Constitutional: Negative for chills and fever.  HENT: Negative.   Eyes: Negative for visual disturbance.  Respiratory: Negative for cough and shortness of breath.   Cardiovascular: Negative for chest pain.  Gastrointestinal: Positive for abdominal pain, diarrhea, nausea and vomiting. Negative for blood in stool.  Genitourinary: Negative for dysuria, flank pain, frequency, vaginal bleeding and vaginal discharge.  Musculoskeletal: Negative for arthralgias and myalgias.  Skin: Negative for color change and rash.  Neurological: Negative for dizziness, syncope and light-headedness.     Physical Exam Updated Vital Signs BP 130/63   Pulse 93   Temp 99.3 F (37.4 C) (Oral)   Resp 20   LMP 12/31/2013   SpO2 95%   Physical Exam Vitals signs and nursing note reviewed.  Constitutional:      General: She  is in acute distress.     Appearance: She is well-developed and normal weight. She is not toxic-appearing or diaphoretic.     Comments: Patient appears very uncomfortable and is in some distress but is nontoxic appearing  HENT:     Head: Normocephalic and atraumatic.     Mouth/Throat:     Mouth: Mucous membranes are moist.     Pharynx: Oropharynx is clear.  Eyes:     General:        Right eye: No discharge.        Left eye: No discharge.     Pupils: Pupils are equal, round, and reactive to light.  Neck:     Musculoskeletal: Neck supple.  Cardiovascular:     Rate and Rhythm: Normal rate and regular rhythm.     Heart sounds: Normal heart sounds.  Pulmonary:     Effort: Pulmonary effort is normal. No respiratory distress.     Breath sounds: Normal breath sounds. No wheezing or rales.  Abdominal:     General: Bowel sounds are normal. There is distension.     Palpations: There is no mass.     Tenderness: There is abdominal tenderness in the right lower quadrant, suprapubic area and left lower quadrant. There is guarding.     Comments: Abdomen is somewhat distended and hard on palpation, bowel sounds are present, there is tenderness throughout most notable across the lower abdomen with guarding in the left lower quadrant, concerning for acute abdomen  Musculoskeletal:        General: No deformity.  Skin:    General: Skin is warm and dry.     Capillary Refill: Capillary refill takes less than 2 seconds.  Neurological:     Mental Status: She is alert and oriented to person, place, and time.     Coordination: Coordination normal.     Comments: Speech is clear, able  to follow commands Moves extremities without ataxia, coordination intact  Psychiatric:        Mood and Affect: Mood normal.        Behavior: Behavior normal.      ED Treatments / Results  Labs (all labs ordered are listed, but only abnormal results are displayed) Labs Reviewed  COMPREHENSIVE METABOLIC PANEL -  Abnormal; Notable for the following components:      Result Value   Sodium 132 (*)    Potassium 2.7 (*)    Chloride 97 (*)    Glucose, Bld 134 (*)    Calcium 8.5 (*)    AST 14 (*)    All other components within normal limits  CBC WITH DIFFERENTIAL/PLATELET - Abnormal; Notable for the following components:   WBC 11.5 (*)    Neutro Abs 10.2 (*)    All other components within normal limits  LIPASE, BLOOD  LACTIC ACID, PLASMA  URINALYSIS, ROUTINE W REFLEX MICROSCOPIC  LACTIC ACID, PLASMA  MAGNESIUM    EKG None  Radiology No results found.  Procedures Procedures (including critical care time)  Medications Ordered in ED Medications  potassium chloride 10 mEq in 100 mL IVPB (has no administration in time range)  iohexol (OMNIPAQUE) 300 MG/ML solution 100 mL (has no administration in time range)  sodium chloride 0.9 % bolus 1,000 mL (1,000 mLs Intravenous New Bag/Given 01/26/19 2037)  ondansetron (ZOFRAN) injection 4 mg (4 mg Intravenous Given 01/26/19 2037)  morphine 4 MG/ML injection 4 mg (4 mg Intravenous Given 01/26/19 2037)  HYDROmorphone (DILAUDID) injection 1 mg (1 mg Intravenous Given 01/26/19 2118)     Initial Impression / Assessment and Plan / ED Course  I have reviewed the triage vital signs and the nursing notes.  Pertinent labs & imaging results that were available during my care of the patient were reviewed by me and considered in my medical decision making (see chart for details).  Patient presents with worsening lower abdominal pain, diagnosed with diverticulitis yesterday and started treatment.  History of diverticulitis in the past.  Patient reports significant worsening in pain this afternoon.  On arrival patient with low-grade fever, vitals otherwise normal.  On exam abdomen is mildly distended with significant tenderness and guarding within the lower quadrant, concerning for acute abdomen.  Will check abdominal labs and lactic acid, CT abdomen pelvis ordered.   Given concern for possible perforation with diverticulitis, acute abdominal x-ray ordered as well.  Will give IV fluids, morphine and Zofran.  Labs show mild leukocytosis of 11.1 with left shift, normal hemoglobin, lactic acid is not elevated which is reassuring.  Patient does have significant hypokalemia of 2.7, mild hyponatremia of 132, no other acute electrolyte derangements requiring intervention, normal renal and liver function and normal lipase.  Urinalysis pending.  Given hypokalemia will check magnesium and give IV potassium.  I suspect this is in the setting of multiple episodes of diarrhea and vomiting.  Awaiting CT, patient continues to be in severe pain, blood pressure stable, dose of IV Dilaudid given.  At shift change care signed out to PA Lily Kocher who will follow-up on CT scan, in the setting of hypokalemia with continued diarrhea and severe pain suspect patient may require admission, I am still very concerned for possible perforation.  Final Clinical Impressions(s) / ED Diagnoses   Final diagnoses:  Left lower quadrant abdominal pain  Hypokalemia    ED Discharge Orders    None       Jacqlyn Larsen, [place]  01/26/19 2159    Francine Graven, DO 01/26/19 2250

## 2019-01-26 NOTE — ED Notes (Signed)
Date and time results received: 01/26/19 9:38 PM    Test: K Critical Value: 2.7  Name of Provider Notified: Marijean Bravo  Orders Received? Or Actions Taken?: see orders

## 2019-01-27 DIAGNOSIS — E7800 Pure hypercholesterolemia, unspecified: Secondary | ICD-10-CM | POA: Diagnosis not present

## 2019-01-27 DIAGNOSIS — Z6835 Body mass index (BMI) 35.0-35.9, adult: Secondary | ICD-10-CM | POA: Diagnosis not present

## 2019-01-27 DIAGNOSIS — F411 Generalized anxiety disorder: Secondary | ICD-10-CM | POA: Diagnosis present

## 2019-01-27 DIAGNOSIS — I5032 Chronic diastolic (congestive) heart failure: Secondary | ICD-10-CM | POA: Diagnosis present

## 2019-01-27 DIAGNOSIS — I110 Hypertensive heart disease with heart failure: Secondary | ICD-10-CM | POA: Diagnosis present

## 2019-01-27 DIAGNOSIS — Z1159 Encounter for screening for other viral diseases: Secondary | ICD-10-CM | POA: Diagnosis not present

## 2019-01-27 DIAGNOSIS — I2510 Atherosclerotic heart disease of native coronary artery without angina pectoris: Secondary | ICD-10-CM | POA: Diagnosis present

## 2019-01-27 DIAGNOSIS — F4320 Adjustment disorder, unspecified: Secondary | ICD-10-CM | POA: Diagnosis present

## 2019-01-27 DIAGNOSIS — E785 Hyperlipidemia, unspecified: Secondary | ICD-10-CM | POA: Diagnosis present

## 2019-01-27 DIAGNOSIS — E876 Hypokalemia: Secondary | ICD-10-CM

## 2019-01-27 DIAGNOSIS — F320 Major depressive disorder, single episode, mild: Secondary | ICD-10-CM | POA: Diagnosis not present

## 2019-01-27 DIAGNOSIS — G4733 Obstructive sleep apnea (adult) (pediatric): Secondary | ICD-10-CM | POA: Diagnosis present

## 2019-01-27 DIAGNOSIS — K660 Peritoneal adhesions (postprocedural) (postinfection): Secondary | ICD-10-CM | POA: Diagnosis present

## 2019-01-27 DIAGNOSIS — F3341 Major depressive disorder, recurrent, in partial remission: Secondary | ICD-10-CM | POA: Diagnosis present

## 2019-01-27 DIAGNOSIS — Z87891 Personal history of nicotine dependence: Secondary | ICD-10-CM | POA: Diagnosis not present

## 2019-01-27 DIAGNOSIS — N74 Female pelvic inflammatory disorders in diseases classified elsewhere: Secondary | ICD-10-CM | POA: Diagnosis present

## 2019-01-27 DIAGNOSIS — E669 Obesity, unspecified: Secondary | ICD-10-CM | POA: Diagnosis present

## 2019-01-27 DIAGNOSIS — R1032 Left lower quadrant pain: Secondary | ICD-10-CM | POA: Diagnosis not present

## 2019-01-27 DIAGNOSIS — M7989 Other specified soft tissue disorders: Secondary | ICD-10-CM | POA: Diagnosis not present

## 2019-01-27 DIAGNOSIS — I10 Essential (primary) hypertension: Secondary | ICD-10-CM | POA: Diagnosis not present

## 2019-01-27 DIAGNOSIS — Z01810 Encounter for preprocedural cardiovascular examination: Secondary | ICD-10-CM | POA: Diagnosis not present

## 2019-01-27 DIAGNOSIS — R7303 Prediabetes: Secondary | ICD-10-CM | POA: Diagnosis present

## 2019-01-27 DIAGNOSIS — K650 Generalized (acute) peritonitis: Secondary | ICD-10-CM | POA: Diagnosis present

## 2019-01-27 DIAGNOSIS — K5792 Diverticulitis of intestine, part unspecified, without perforation or abscess without bleeding: Secondary | ICD-10-CM | POA: Diagnosis not present

## 2019-01-27 DIAGNOSIS — G4700 Insomnia, unspecified: Secondary | ICD-10-CM | POA: Diagnosis present

## 2019-01-27 DIAGNOSIS — K651 Peritoneal abscess: Secondary | ICD-10-CM | POA: Diagnosis present

## 2019-01-27 DIAGNOSIS — K420 Umbilical hernia with obstruction, without gangrene: Secondary | ICD-10-CM | POA: Diagnosis present

## 2019-01-27 DIAGNOSIS — K5720 Diverticulitis of large intestine with perforation and abscess without bleeding: Secondary | ICD-10-CM | POA: Diagnosis present

## 2019-01-27 DIAGNOSIS — I252 Old myocardial infarction: Secondary | ICD-10-CM | POA: Diagnosis not present

## 2019-01-27 LAB — CBC
HCT: 37.9 % (ref 36.0–46.0)
Hemoglobin: 12.4 g/dL (ref 12.0–15.0)
MCH: 28.4 pg (ref 26.0–34.0)
MCHC: 32.7 g/dL (ref 30.0–36.0)
MCV: 86.9 fL (ref 80.0–100.0)
Platelets: 217 10*3/uL (ref 150–400)
RBC: 4.36 MIL/uL (ref 3.87–5.11)
RDW: 13.2 % (ref 11.5–15.5)
WBC: 15.4 10*3/uL — ABNORMAL HIGH (ref 4.0–10.5)
nRBC: 0 % (ref 0.0–0.2)

## 2019-01-27 LAB — SARS CORONAVIRUS 2 BY RT PCR (HOSPITAL ORDER, PERFORMED IN ~~LOC~~ HOSPITAL LAB): SARS Coronavirus 2: NEGATIVE

## 2019-01-27 LAB — COMPREHENSIVE METABOLIC PANEL
ALT: 12 U/L (ref 0–44)
AST: 10 U/L — ABNORMAL LOW (ref 15–41)
Albumin: 2.8 g/dL — ABNORMAL LOW (ref 3.5–5.0)
Alkaline Phosphatase: 53 U/L (ref 38–126)
Anion gap: 9 (ref 5–15)
BUN: 11 mg/dL (ref 6–20)
CO2: 26 mmol/L (ref 22–32)
Calcium: 7.7 mg/dL — ABNORMAL LOW (ref 8.9–10.3)
Chloride: 99 mmol/L (ref 98–111)
Creatinine, Ser: 0.57 mg/dL (ref 0.44–1.00)
GFR calc Af Amer: >60 mL/min (ref >60)
GFR calc non Af Amer: >60 mL/min (ref >60)
Glucose, Bld: 109 mg/dL — ABNORMAL HIGH (ref 70–99)
Potassium: 3.5 mmol/L (ref 3.5–5.1)
Sodium: 134 mmol/L — ABNORMAL LOW (ref 135–145)
Total Bilirubin: 0.6 mg/dL (ref 0.3–1.2)
Total Protein: 5.9 g/dL — ABNORMAL LOW (ref 6.5–8.1)

## 2019-01-27 LAB — URINALYSIS, ROUTINE W REFLEX MICROSCOPIC
Bacteria, UA: NONE SEEN
Bilirubin Urine: NEGATIVE
Glucose, UA: NEGATIVE mg/dL
Ketones, ur: NEGATIVE mg/dL
Nitrite: NEGATIVE
Protein, ur: 100 mg/dL — AB
RBC / HPF: >50 RBC/hpf — ABNORMAL HIGH (ref 0–5)
Specific Gravity, Urine: >1.046 — ABNORMAL HIGH (ref 1.005–1.030)
pH: 5 (ref 5.0–8.0)

## 2019-01-27 MED ORDER — CIPROFLOXACIN IN D5W 400 MG/200ML IV SOLN
400.0000 mg | Freq: Two times a day (BID) | INTRAVENOUS | Status: DC
  Administered 2019-01-27 – 2019-01-30 (×6): 400 mg via INTRAVENOUS
  Filled 2019-01-27 (×7): qty 200

## 2019-01-27 MED ORDER — HYDROMORPHONE HCL 1 MG/ML IJ SOLN
1.0000 mg | INTRAMUSCULAR | Status: DC | PRN
  Administered 2019-01-27 – 2019-01-31 (×21): 1 mg via INTRAVENOUS
  Filled 2019-01-27 (×22): qty 1

## 2019-01-27 MED ORDER — ROSUVASTATIN CALCIUM 5 MG PO TABS
10.0000 mg | ORAL_TABLET | Freq: Every day | ORAL | Status: DC
  Administered 2019-01-27 – 2019-02-14 (×18): 10 mg via ORAL
  Filled 2019-01-27 (×14): qty 2
  Filled 2019-01-27: qty 1
  Filled 2019-01-27 (×2): qty 2
  Filled 2019-01-27: qty 1

## 2019-01-27 MED ORDER — BUSPIRONE HCL 15 MG PO TABS
15.0000 mg | ORAL_TABLET | Freq: Three times a day (TID) | ORAL | Status: DC
  Administered 2019-01-27 – 2019-01-29 (×7): 15 mg via ORAL
  Filled 2019-01-27 (×2): qty 1
  Filled 2019-01-27: qty 3
  Filled 2019-01-27: qty 1
  Filled 2019-01-27 (×3): qty 3

## 2019-01-27 MED ORDER — ONDANSETRON HCL 4 MG PO TABS: 4.0000 mg | ORAL_TABLET | Freq: Four times a day (QID) | ORAL | Status: DC | PRN

## 2019-01-27 MED ORDER — ASPIRIN EC 325 MG PO TBEC
325.0000 mg | DELAYED_RELEASE_TABLET | Freq: Every day | ORAL | Status: DC
  Administered 2019-01-27 – 2019-01-28 (×2): 325 mg via ORAL
  Filled 2019-01-27 (×3): qty 1

## 2019-01-27 MED ORDER — ALPRAZOLAM 0.25 MG PO TABS: 0.2500 mg | ORAL_TABLET | Freq: Every day | ORAL | Status: DC | PRN

## 2019-01-27 MED ORDER — LOSARTAN POTASSIUM 50 MG PO TABS: 50.0000 mg | ORAL_TABLET | Freq: Every day | ORAL | Status: DC

## 2019-01-27 MED ORDER — HYDROCHLOROTHIAZIDE 25 MG PO TABS: 25.0000 mg | ORAL_TABLET | Freq: Every day | ORAL | Status: DC

## 2019-01-27 MED ORDER — POTASSIUM CHLORIDE 10 MEQ/100ML IV SOLN
10.0000 meq | INTRAVENOUS | Status: AC
  Administered 2019-01-27 (×2): 10 meq via INTRAVENOUS
  Filled 2019-01-27: qty 100

## 2019-01-27 MED ORDER — METRONIDAZOLE IN NACL 5-0.79 MG/ML-% IV SOLN
500.0000 mg | Freq: Three times a day (TID) | INTRAVENOUS | Status: DC
  Administered 2019-01-27 – 2019-01-30 (×10): 500 mg via INTRAVENOUS
  Filled 2019-01-27 (×10): qty 100

## 2019-01-27 MED ORDER — LOSARTAN POTASSIUM-HCTZ 100-25 MG PO TABS: 1.0000 | ORAL_TABLET | Freq: Every day | ORAL | Status: DC

## 2019-01-27 MED ORDER — TRAZODONE HCL 50 MG PO TABS: 50.0000 mg | ORAL_TABLET | Freq: Every evening | ORAL | Status: DC | PRN

## 2019-01-27 MED ORDER — GUAIFENESIN ER 600 MG PO TB12
600.0000 mg | ORAL_TABLET | Freq: Two times a day (BID) | ORAL | Status: DC
  Administered 2019-01-27 – 2019-02-14 (×22): 600 mg via ORAL
  Filled 2019-01-27 (×33): qty 1

## 2019-01-27 MED ORDER — ACETAMINOPHEN 325 MG PO TABS
650.0000 mg | ORAL_TABLET | Freq: Four times a day (QID) | ORAL | Status: DC | PRN
  Administered 2019-01-27 – 2019-02-08 (×14): 650 mg via ORAL
  Filled 2019-01-27 (×15): qty 2

## 2019-01-27 MED ORDER — HYDROMORPHONE HCL 1 MG/ML IJ SOLN
1.0000 mg | Freq: Once | INTRAMUSCULAR | Status: AC
  Administered 2019-01-27: 1 mg via INTRAVENOUS

## 2019-01-27 MED ORDER — METOPROLOL TARTRATE 25 MG PO TABS
25.0000 mg | ORAL_TABLET | Freq: Two times a day (BID) | ORAL | Status: DC
  Administered 2019-01-27 – 2019-01-29 (×4): 25 mg via ORAL
  Filled 2019-01-27 (×5): qty 1

## 2019-01-27 MED ORDER — ENOXAPARIN SODIUM 40 MG/0.4ML ~~LOC~~ SOLN: 40.0000 mg | SUBCUTANEOUS | Status: DC

## 2019-01-27 MED ORDER — DULOXETINE HCL 60 MG PO CPEP
60.0000 mg | ORAL_CAPSULE | Freq: Every day | ORAL | Status: DC
  Administered 2019-01-27 – 2019-01-29 (×3): 60 mg via ORAL
  Filled 2019-01-27 (×3): qty 1

## 2019-01-27 MED ORDER — ONDANSETRON HCL 4 MG/2ML IJ SOLN
4.0000 mg | Freq: Four times a day (QID) | INTRAMUSCULAR | Status: DC | PRN
  Administered 2019-01-27 (×2): 4 mg via INTRAVENOUS
  Filled 2019-01-27 (×2): qty 2

## 2019-01-27 MED ORDER — LOSARTAN POTASSIUM 50 MG PO TABS: 100.0000 mg | ORAL_TABLET | Freq: Every day | ORAL | Status: DC

## 2019-01-27 MED ORDER — ACETAMINOPHEN 650 MG RE SUPP: 650.0000 mg | Freq: Four times a day (QID) | RECTAL | Status: DC | PRN

## 2019-01-27 MED ORDER — SODIUM CHLORIDE 0.9 % IV SOLN
INTRAVENOUS | Status: DC
  Administered 2019-01-27 – 2019-02-03 (×10): via INTRAVENOUS

## 2019-01-27 NOTE — ED Provider Notes (Signed)
CONTINUING CARE FROM PA [facility].  Patient is a 58 year old female who presents to the emergency department with a complaint of abdominal pain.  Patient reports worsening pain mostly in the right lower quadrant, suprapubic area, and left lower quadrant.  Patient has guarding and distention.  There was concern for surgical abdomen.  Work-up is in progress.  CT abdomen pending.   Vital signs reviewed.  Patient tachycardic, but otherwise stable vital signs.  CT scan shows findings consistent with an acute complicated sigmoid diverticulitis.  There is a 3.8 cm abscess adjacent to the sigmoid colon.  And there is a small to moderate amount of fluid within the pelvis with a questionable rim enhancement.  There is severe sigmoid diverticulosis.  Case discussed with surgery, as well as with [facility] hospitalist by Dr. Thurnell Garbe.  Patient to be admitted to the hospital by the [facility] hospitalist service.   Lily Kocher, PA-C 01/27/19 [place], [place], DO 01/30/19 [phone]

## 2019-01-27 NOTE — Progress Notes (Signed)
Patient c/o abdominal pain and bladder spasms.  Patient stated she had not avoided since 01-26-19 at 1500.  Bladder scan in em showed approximately 300cc. Patient was I&O cath per md, order, received 250 cc of dark yellow urine.

## 2019-01-27 NOTE — Progress Notes (Signed)
ASSUMPTION OF CARE NOTE   01/27/2019 9:54 AM  Heather Lam was seen and examined.  The H&P by the admitting provider, orders, imaging was reviewed.  Please see new orders.  Will continue to follow. The patient is still having a lot of abdominal pain but it is better than last night.  I spoke with Dr. Arnoldo Morale, he is planning to have IR take a look at abscess for drain consideration.   She likely is going to need hemicolectomy in the near future.    Vitals:   01/27/19 0825 01/27/19 0906  BP:  (!) 88/50  Pulse:  74  Resp:  16  Temp:  98.2 F (36.8 C)  SpO2: 94% 92%    Results for orders placed or performed during the hospital encounter of 01/26/19  SARS Coronavirus 2 (CEPHEID - Performed in [place] hospital lab), [facility] Order   Specimen: Nasopharyngeal Swab  Result Value Ref Range   SARS Coronavirus 2 NEGATIVE NEGATIVE  Comprehensive metabolic panel  Result Value Ref Range   Sodium 132 (L) 135 - 145 mmol/L   Potassium 2.7 (LL) 3.5 - 5.1 mmol/L   Chloride 97 (L) 98 - 111 mmol/L   CO2 24 22 - 32 mmol/L   Glucose, Bld 134 (H) 70 - 99 mg/dL   BUN 11 6 - 20 mg/dL   Creatinine, Ser 0.58 0.44 - 1.00 mg/dL   Calcium 8.5 (L) 8.9 - 10.3 mg/dL   Total Protein 7.4 6.5 - 8.1 g/dL   Albumin 3.5 3.5 - 5.0 g/dL   AST 14 (L) 15 - 41 U/L   ALT 14 0 - 44 U/L   Alkaline Phosphatase 69 38 - 126 U/L   Total Bilirubin 0.6 0.3 - 1.2 mg/dL   GFR calc non Af Amer >60 >60 mL/min   GFR calc Af Amer >60 >60 mL/min   Anion gap 11 5 - 15  Lipase, blood  Result Value Ref Range   Lipase 26 11 - 51 U/L  CBC with Differential  Result Value Ref Range   WBC 11.5 (H) 4.0 - 10.5 K/uL   RBC 4.94 3.87 - 5.11 MIL/uL   Hemoglobin 14.1 12.0 - 15.0 g/dL   HCT 41.9 36.0 - 46.0 %   MCV 84.8 80.0 - 100.0 fL   MCH 28.5 26.0 - 34.0 pg   MCHC 33.7 30.0 - 36.0 g/dL   RDW 13.1 11.5 - 15.5 %   Platelets 214 150 - 400 K/uL   nRBC 0.0 0.0 - 0.2 %   Neutrophils Relative % 89 %   Neutro Abs 10.2 (H) 1.7 - 7.7 K/uL    Lymphocytes Relative 7 %   Lymphs Abs 0.8 0.7 - 4.0 K/uL   Monocytes Relative 3 %   Monocytes Absolute 0.4 0.1 - 1.0 K/uL   Eosinophils Relative 0 %   Eosinophils Absolute 0.0 0.0 - 0.5 K/uL   Basophils Relative 1 %   Basophils Absolute 0.1 0.0 - 0.1 K/uL   Immature Granulocytes 0 %   Abs Immature Granulocytes 0.04 0.00 - 0.07 K/uL  Urinalysis, Routine w reflex microscopic  Result Value Ref Range   Color, Urine YELLOW YELLOW   APPearance CLEAR CLEAR   Specific Gravity, Urine >1.046 (H) 1.005 - 1.030   pH 5.0 5.0 - 8.0   Glucose, UA NEGATIVE NEGATIVE mg/dL   Hgb urine dipstick MODERATE (A) NEGATIVE   Bilirubin Urine NEGATIVE NEGATIVE   Ketones, ur NEGATIVE NEGATIVE mg/dL   Protein, ur 100 (A)  NEGATIVE mg/dL   Nitrite NEGATIVE NEGATIVE   Leukocytes,Ua TRACE (A) NEGATIVE   RBC / HPF >50 (H) 0 - 5 RBC/hpf   WBC, UA 0-5 0 - 5 WBC/hpf   Bacteria, UA NONE SEEN NONE SEEN   Squamous Epithelial / LPF 0-5 0 - 5   Mucus PRESENT   Lactic acid, plasma  Result Value Ref Range   Lactic Acid, Venous 1.0 0.5 - 1.9 mmol/L  Lactic acid, plasma  Result Value Ref Range   Lactic Acid, Venous 1.0 0.5 - 1.9 mmol/L  Magnesium  Result Value Ref Range   Magnesium 1.6 (L) 1.7 - 2.4 mg/dL  Protime-INR  Result Value Ref Range   Prothrombin Time 13.0 11.4 - 15.2 seconds   INR 1.0 0.8 - 1.2  CBC  Result Value Ref Range   WBC 15.4 (H) 4.0 - 10.5 K/uL   RBC 4.36 3.87 - 5.11 MIL/uL   Hemoglobin 12.4 12.0 - 15.0 g/dL   HCT 37.9 36.0 - 46.0 %   MCV 86.9 80.0 - 100.0 fL   MCH 28.4 26.0 - 34.0 pg   MCHC 32.7 30.0 - 36.0 g/dL   RDW 13.2 11.5 - 15.5 %   Platelets 217 150 - 400 K/uL   nRBC 0.0 0.0 - 0.2 %  Comprehensive metabolic panel  Result Value Ref Range   Sodium 134 (L) 135 - 145 mmol/L   Potassium 3.5 3.5 - 5.1 mmol/L   Chloride 99 98 - 111 mmol/L   CO2 26 22 - 32 mmol/L   Glucose, Bld 109 (H) 70 - 99 mg/dL   BUN 11 6 - 20 mg/dL   Creatinine, Ser 0.57 0.44 - 1.00 mg/dL   Calcium 7.7 (L)  8.9 - 10.3 mg/dL   Total Protein 5.9 (L) 6.5 - 8.1 g/dL   Albumin 2.8 (L) 3.5 - 5.0 g/dL   AST 10 (L) 15 - 41 U/L   ALT 12 0 - 44 U/L   Alkaline Phosphatase 53 38 - 126 U/L   Total Bilirubin 0.6 0.3 - 1.2 mg/dL   GFR calc non Af Amer >60 >60 mL/min   GFR calc Af Amer >60 >60 mL/min   Anion gap 9 5 - 15     C. Wynetta Emery, MD [facility]   01/26/2019  8:02 PM How to contact the [facility] Attending or Consulting provider [place] or covering provider during after hours [place], for this patient?  1. Check the care team in [facility] and look for a) attending/consulting [facility] provider listed and b) the [facility] team listed 2. Log into [email or web address] and use [place]'s universal password to access. If you do not have the password, please contact the hospital operator. 3. Locate the [facility] provider you are looking for under [facility] and page to a number that you can be directly reached. 4. If you still have difficulty reaching the provider, please page the [facility] (Director on Call) for the Hospitalists listed on amion for assistance.

## 2019-01-27 NOTE — Consult Note (Signed)
Reason for Consult: Sigmoid diverticulitis with perforation Referring Physician: Dr. Enzo Bi Heather Lam is an 58 y.o. female.  HPI: Patient is a 58 year old white female who presents with a several day history of worsening left lower quadrant abdominal pain.  She was referred to the emergency room and a CT scan of the abdomen revealed perforated sigmoid diverticulitis.  It appears that this perforation is somewhat contained.  Patient was admitted to the hospital for IV antibiotics and bowel rest.  She states that this is her fourth episode of presumed diverticulitis over the past 5 years.  She has had a colonoscopy in the past.  This is her first admission for treatment of diverticulitis.  She is on Dilaudid for pain.  Past Medical History:  Diagnosis Date  . Anxiety   . Back pain   . CAD (coronary artery disease)   . Chest pain   . Complication of anesthesia   . Constipation   . Diverticulitis   . Diverticulosis   . Heart attack ([place]) 2010  . History of kidney stones   . Hyperlipemia   . Hypertension   . Joint pain   . PONV (postoperative nausea and vomiting)   . Skin cancer    on nose  . Sleep apnea    wears CPAP nightly  . Vitamin D deficiency     Past Surgical History:  Procedure Laterality Date  . APPENDECTOMY    . BACK SURGERY    . CARPAL TUNNEL RELEASE Right   . CARPAL TUNNEL RELEASE Left 08/13/2016   Procedure: LEFT CARPAL TUNNEL RELEASE;  Surgeon: Roseanne Kaufman, MD;  Location: [facility];  Service: Orthopedics;  Laterality: Left;  . CORONARY STENT PLACEMENT     2 stents in the same artery  . ENDOMETRIAL ABLATION  2005  . EXTRACORPOREAL SHOCK WAVE LITHOTRIPSY Right 06/27/2017   Procedure: RIGHT EXTRACORPOREAL SHOCK WAVE LITHOTRIPSY (ESWL);  Surgeon: Franchot Gallo, MD;  Location: [facility];  Service: Urology;  Laterality: Right;  . MOHS SURGERY    . TUBAL LIGATION  1991    Family History  Problem Relation Age of Onset  . Hyperlipidemia  Mother   . Hypertension Mother   . Cancer Father        skin  . Hyperlipidemia Father   . Dementia Father   . Arthritis Father   . Hypertension Father   . Sleep apnea Father   . Other Sister        Myelofibrosis  . Arthritis Sister   . Hyperlipidemia Sister   . Arthritis Sister   . Hyperlipidemia Sister   . Diabetes Paternal Grandfather   . Colon cancer Neg Hx     Social History:  reports that she has quit smoking. She has never used smokeless tobacco. She reports current alcohol use. She reports that she does not use drugs.  Allergies: No Known Allergies  Medications: I have reviewed the patient's current medications.  Results for orders placed or performed during the hospital encounter of 01/26/19 (from the past 48 hour(s))  Comprehensive metabolic panel     Status: Abnormal   Collection Time: 01/26/19  8:32 PM  Result Value Ref Range   Sodium 132 (L) 135 - 145 mmol/L   Potassium 2.7 (LL) 3.5 - 5.1 mmol/L    Comment: CRITICAL RESULT CALLED TO, READ BACK BY AND VERIFIED WITH: OAKLEY,B ON 01/26/19 AT 2140 BY LOY,C    Chloride 97 (L) 98 - 111 mmol/L   CO2 24 22 -  32 mmol/L   Glucose, Bld 134 (H) 70 - 99 mg/dL   BUN 11 6 - 20 mg/dL   Creatinine, Ser 0.58 0.44 - 1.00 mg/dL   Calcium 8.5 (L) 8.9 - 10.3 mg/dL   Total Protein 7.4 6.5 - 8.1 g/dL   Albumin 3.5 3.5 - 5.0 g/dL   AST 14 (L) 15 - 41 U/L   ALT 14 0 - 44 U/L   Alkaline Phosphatase 69 38 - 126 U/L   Total Bilirubin 0.6 0.3 - 1.2 mg/dL   GFR calc non Af Amer >60 >60 mL/min   GFR calc Af Amer >60 >60 mL/min   Anion gap 11 5 - 15    Comment: Performed at [facility], [address] [place], [place] [address]  Lipase, blood     Status: None   Collection Time: 01/26/19  8:32 PM  Result Value Ref Range   Lipase 26 11 - 51 U/L    Comment: Performed at [facility], [address], [place], [place] [address]  CBC with Differential     Status: Abnormal   Collection Time: 01/26/19  8:32 PM  Result Value Ref Range    WBC 11.5 (H) 4.0 - 10.5 K/uL   RBC 4.94 3.87 - 5.11 MIL/uL   Hemoglobin 14.1 12.0 - 15.0 g/dL   HCT 41.9 36.0 - 46.0 %   MCV 84.8 80.0 - 100.0 fL   MCH 28.5 26.0 - 34.0 pg   MCHC 33.7 30.0 - 36.0 g/dL   RDW 13.1 11.5 - 15.5 %   Platelets 214 150 - 400 K/uL   nRBC 0.0 0.0 - 0.2 %   Neutrophils Relative % 89 %   Neutro Abs 10.2 (H) 1.7 - 7.7 K/uL   Lymphocytes Relative 7 %   Lymphs Abs 0.8 0.7 - 4.0 K/uL   Monocytes Relative 3 %   Monocytes Absolute 0.4 0.1 - 1.0 K/uL   Eosinophils Relative 0 %   Eosinophils Absolute 0.0 0.0 - 0.5 K/uL   Basophils Relative 1 %   Basophils Absolute 0.1 0.0 - 0.1 K/uL   Immature Granulocytes 0 %   Abs Immature Granulocytes 0.04 0.00 - 0.07 K/uL    Comment: Performed at [facility], [address] [place], [place] [address]  Magnesium     Status: Abnormal   Collection Time: 01/26/19  8:32 PM  Result Value Ref Range   Magnesium 1.6 (L) 1.7 - 2.4 mg/dL    Comment: Performed at [facility], [address] [place], [place] [address]  Protime-INR     Status: None   Collection Time: 01/26/19  8:32 PM  Result Value Ref Range   Prothrombin Time 13.0 11.4 - 15.2 seconds   INR 1.0 0.8 - 1.2    Comment: (NOTE) INR goal varies based on device and disease states. Performed at [facility], [address] [place], [place] [address]   Lactic acid, plasma     Status: None   Collection Time: 01/26/19  8:33 PM  Result Value Ref Range   Lactic Acid, Venous 1.0 0.5 - 1.9 mmol/L    Comment: Performed at [facility], [address] [place], [place] [address]  Lactic acid, plasma     Status: None   Collection Time: 01/26/19 10:40 PM  Result Value Ref Range   Lactic Acid, Venous 1.0 0.5 - 1.9 mmol/L    Comment: Performed at [facility], [address], [place], [place] [address]  SARS Coronavirus 2 (CEPHEID - Performed  in [place] lab), Hosp Order     Status: None   Collection Time: 01/26/19 10:40 PM   Specimen: Nasopharyngeal Swab  Result Value  Ref Range   SARS Coronavirus 2 NEGATIVE NEGATIVE    Comment: (NOTE) If result is NEGATIVE SARS-CoV-2 target nucleic acids are NOT DETECTED. The SARS-CoV-2 RNA is generally detectable in upper and lower  respiratory specimens during the acute phase of infection. The lowest  concentration of SARS-CoV-2 viral copies this assay can detect is 250  copies / mL. A negative result does not preclude SARS-CoV-2 infection  and should not be used as the sole basis for treatment or other  patient management decisions.  A negative result may occur with  improper specimen collection / handling, submission of specimen other  than nasopharyngeal swab, presence of viral mutation(s) within the  areas targeted by this assay, and inadequate number of viral copies  (<250 copies / mL). A negative result must be combined with clinical  observations, patient history, and epidemiological information. If result is POSITIVE SARS-CoV-2 target nucleic acids are DETECTED. The SARS-CoV-2 RNA is generally detectable in upper and lower  respiratory specimens dur ing the acute phase of infection.  Positive  results are indicative of active infection with SARS-CoV-2.  Clinical  correlation with patient history and other diagnostic information is  necessary to determine patient infection status.  Positive results do  not rule out bacterial infection or co-infection with other viruses. If result is PRESUMPTIVE POSTIVE SARS-CoV-2 nucleic acids MAY BE PRESENT.   A presumptive positive result was obtained on the submitted specimen  and confirmed on repeat testing.  While 2019 novel coronavirus  (SARS-CoV-2) nucleic acids may be present in the submitted sample  additional confirmatory testing may be necessary for epidemiological  and / or clinical management purposes  to differentiate between  SARS-CoV-2 and other Sarbecovirus currently known to infect humans.  If clinically indicated additional testing with an alternate  test  methodology [phone]) is advised. The SARS-CoV-2 RNA is generally  detectable in upper and lower respiratory sp ecimens during the acute  phase of infection. The expected result is Negative. Fact Sheet for Patients:  StrictlyIdeas.no Fact Sheet for Healthcare Providers: BankingDealers.co.za This test is not yet approved or cleared by the [place] FDA and has been authorized for detection and/or diagnosis of SARS-CoV-2 by FDA under an Emergency Use Authorization (EUA).  This EUA will remain in effect (meaning this test can be used) for the duration of the COVID-19 declaration under Section 564(b)(1) of the Act, 21 U.S.C. section 360bbb-3(b)(1), unless the authorization is terminated or revoked sooner. Performed at [facility], [address], [place], [place] [address]   Urinalysis, Routine w reflex microscopic     Status: Abnormal   Collection Time: 01/27/19  3:22 AM  Result Value Ref Range   Color, Urine YELLOW YELLOW   APPearance CLEAR CLEAR   Specific Gravity, Urine >1.046 (H) 1.005 - 1.030   pH 5.0 5.0 - 8.0   Glucose, UA NEGATIVE NEGATIVE mg/dL   Hgb urine dipstick MODERATE (A) NEGATIVE   Bilirubin Urine NEGATIVE NEGATIVE   Ketones, ur NEGATIVE NEGATIVE mg/dL   Protein, ur 100 (A) NEGATIVE mg/dL   Nitrite NEGATIVE NEGATIVE   Leukocytes,Ua TRACE (A) NEGATIVE   RBC / HPF >50 (H) 0 - 5 RBC/hpf   WBC, UA 0-5 0 - 5 WBC/hpf   Bacteria, UA NONE SEEN NONE SEEN   Squamous Epithelial / LPF 0-5 0 - 5  Mucus PRESENT     Comment: Performed at [facility], [address], [place], [place] [address]  CBC     Status: Abnormal   Collection Time: 01/27/19  7:18 AM  Result Value Ref Range   WBC 15.4 (H) 4.0 - 10.5 K/uL   RBC 4.36 3.87 - 5.11 MIL/uL   Hemoglobin 12.4 12.0 - 15.0 g/dL   HCT 37.9 36.0 - 46.0 %   MCV 86.9 80.0 - 100.0 fL   MCH 28.4 26.0 - 34.0 pg   MCHC 32.7 30.0 - 36.0 g/dL   RDW 13.2 11.5 - 15.5 %   Platelets  217 150 - 400 K/uL   nRBC 0.0 0.0 - 0.2 %    Comment: Performed at [facility], [address] [place], [place] [address]  Comprehensive metabolic panel     Status: Abnormal   Collection Time: 01/27/19  7:18 AM  Result Value Ref Range   Sodium 134 (L) 135 - 145 mmol/L   Potassium 3.5 3.5 - 5.1 mmol/L    Comment: DELTA CHECK NOTED   Chloride 99 98 - 111 mmol/L   CO2 26 22 - 32 mmol/L   Glucose, Bld 109 (H) 70 - 99 mg/dL   BUN 11 6 - 20 mg/dL   Creatinine, Ser 0.57 0.44 - 1.00 mg/dL   Calcium 7.7 (L) 8.9 - 10.3 mg/dL   Total Protein 5.9 (L) 6.5 - 8.1 g/dL   Albumin 2.8 (L) 3.5 - 5.0 g/dL   AST 10 (L) 15 - 41 U/L   ALT 12 0 - 44 U/L   Alkaline Phosphatase 53 38 - 126 U/L   Total Bilirubin 0.6 0.3 - 1.2 mg/dL   GFR calc non Af Amer >60 >60 mL/min   GFR calc Af Amer >60 >60 mL/min   Anion gap 9 5 - 15    Comment: Performed at [facility], [address] [place], [place] [address]    Ct Abdomen Pelvis W Contrast  Result Date: 01/26/2019 CLINICAL DATA:  Abdominal pain with diverticulitis suspected. EXAM: CT ABDOMEN AND PELVIS WITH CONTRAST TECHNIQUE: Multidetector CT imaging of the abdomen and pelvis was performed using the standard protocol following bolus administration of intravenous contrast. CONTRAST:  139mL OMNIPAQUE IOHEXOL 300 MG/ML  SOLN COMPARISON:  CT of the pelvis dated 11/19/2016 FINDINGS: Lower chest: The heart size is mildly enlarged. Hepatobiliary: No focal liver abnormality is seen. No gallstones, gallbladder wall thickening, or biliary dilatation. There is likely underlying hepatic steatosis. Pancreas: Unremarkable. No pancreatic ductal dilatation or surrounding inflammatory changes. Spleen: Normal in size without focal abnormality. Adrenals/Urinary Tract: There is a stable peripherally calcified cyst involving the left kidney. There is no hydronephrosis. The adrenal glands are unremarkable. There is a relatively stable hyperdense cyst involving the anterior interpolar  region of the right kidney. There are punctate nonobstructing stones in the lower pole of the right kidney. The bladder is unremarkable. Stomach/Bowel: There are findings of sigmoid diverticulitis complicated by a 3.8 x 3.3 cm abscess (axial series 2, image 71). There is some reactive free fluid in the pelvis with questionable rim enhancement. There is severe sigmoid diverticulosis. The appendix is not reliably identified. There is a stable fat containing intraluminal lesion within the proximal jejunum. The stomach is unremarkable. Vascular/Lymphatic: Aortic atherosclerosis. No enlarged abdominal or pelvic lymph nodes. Reproductive: There is a partially calcified fibroid within the uterus. There is no evidence for an ovarian mass, however evaluation is somewhat limited by free fluid in the patient's pelvis. Other:  There is no abdominal wall hernia. Musculoskeletal: No acute or significant osseous findings. IMPRESSION: 1. Findings consistent with acute complicated sigmoid diverticulitis. There is a 3.8 cm abscess adjacent to the sigmoid colon. There is a small to moderate volume of fluid within the pelvis with questionable rim enhancement. 2. Severe sigmoid diverticulosis. 3. Hepatic steatosis. 4. Nonobstructing stones in the lower pole the right kidney. 5.  Aortic Atherosclerosis (ICD10-I70.0). Electronically Signed   By: Constance Holster M.D.   On: 01/26/2019 22:25    ROS:  Pertinent items are noted in HPI.  Blood pressure (!) 88/50, pulse 74, temperature 98.2 F (36.8 C), temperature source Oral, resp. rate 16, height 5\' 3"  (1.6 m), weight 92.3 kg, last menstrual period 12/31/2013, SpO2 92 %. Physical Exam: Pleasant well-developed well-nourished white female no acute distress Head is normocephalic, atraumatic Lungs clear to auscultation with good breath sounds bilaterally Heart examination reveals a regular rate and rhythm without S3, S4, murmurs Abdomen is soft but slightly distended.  No rigidity  is noted.  She does have tenderness in the left lower half of the abdomen.  CT scan images personally reviewed  Assessment/Plan: Impression: Perforated sigmoid diverticulitis, contained. Plan: Will request interventional radiologic drainage of the abscess.  Further management is pending those results.  I did have a long discussion with the patient concerning the probable need for an elective partial colectomy in the future given the recurrent nature and severity of her diverticulitis.  No need for acute surgical invention at this time.  This was discussed with Dr. Wynetta Emery.  Heather Lam 01/27/2019, 10:04 AM

## 2019-01-27 NOTE — Progress Notes (Addendum)
Request to IR for abdominal abscess aspiration and possible drain placement due to perforated sigmoid diverticulitis. This patient and imaging has been reviewed by Dr. Earleen Newport who approves procedure.  Plan for [facility] to bring patient to [facility] by 0830 tomorrow (6/28) for procedure - RN will arrange transport to [facility] and back to [facility] following procedure. I will place orders for NPO after midnight tonight, pre-procedure lab work. Continue to hold Lovenox/heparin until post procedure. Full consult/consent to be done when patient arrives tomorrow for procedure. This has been communicated to patient's RN Quillian Quince by myself today.  Please call IR with any questions or concerns.  Candiss Norse, PA-C

## 2019-01-27 NOTE — H&P (Signed)
TRH H&P    Patient Demographics:    Heather Lam, is a 58 y.o. female  MRN: [id number]  DOB - 06-13-1961  Admit Date - 01/26/2019  Referring MD/NP/PA: Dr. Thurnell Garbe  Outpatient Primary MD for the patient is Terald Sleeper, PA-C  Patient coming from: Home  Chief complaint-abdominal pain   HPI:    Heather Lam  is a 58 y.o. female, with history of CAD status post stent placement in 2010, diverticulitis, kidney stones, hypertension, hyperlipidemia who came to ED with worsening lower abdominal pain.  Patient says that she was seen by her PCP yesterday for ongoing diarrhea and abdominal pain for past 5 days.  Her PCP diagnosed clinically as diverticulitis and started on Augmentin and Flagyl.  Patient says that she did not feel better and in fact her bloating got worse with significant worsening of abdominal pain.  Patient came to the hospital for further evaluation.  CT scan of the abdomen pelvis showed acute complicated sigmoid diverticulitis.  There is 3.8 cm abscess adjacent to sigmoid colon.  Small to moderate volume of fluid within the pelvis with questionable rim enhancement.  General surgery was consulted by ED physician.  General surgery recommended IV antibiotics and fluids.  Patient will likely need IR evaluation for drainage of abscess.  General surgery to see patient in a.m. She complains of low-grade fever Also had nonbloody diarrhea. Denies chest pain or shortness of breath. Denies dysuria     Review of systems:    In addition to the HPI above,    All other systems reviewed and are negative.    Past History of the following :    Past Medical History:  Diagnosis Date  . Anxiety   . Back pain   . CAD (coronary artery disease)   . Chest pain   . Complication of anesthesia   . Constipation   . Diverticulitis   . Diverticulosis   . Heart attack ([place]) 2010  . History of kidney stones   .  Hyperlipemia   . Hypertension   . Joint pain   . PONV (postoperative nausea and vomiting)   . Skin cancer    on nose  . Sleep apnea    wears CPAP nightly  . Vitamin D deficiency       Past Surgical History:  Procedure Laterality Date  . APPENDECTOMY    . BACK SURGERY    . CARPAL TUNNEL RELEASE Right   . CARPAL TUNNEL RELEASE Left 08/13/2016   Procedure: LEFT CARPAL TUNNEL RELEASE;  Surgeon: Roseanne Kaufman, MD;  Location: [facility];  Service: Orthopedics;  Laterality: Left;  . CORONARY STENT PLACEMENT     2 stents in the same artery  . ENDOMETRIAL ABLATION  2005  . EXTRACORPOREAL SHOCK WAVE LITHOTRIPSY Right 06/27/2017   Procedure: RIGHT EXTRACORPOREAL SHOCK WAVE LITHOTRIPSY (ESWL);  Surgeon: Franchot Gallo, MD;  Location: [facility];  Service: Urology;  Laterality: Right;  . MOHS SURGERY    . TUBAL LIGATION  1991      Social History:  Social History   Tobacco Use  . Smoking status: Former Research scientist (life sciences)  . Smokeless tobacco: Never Used  Substance Use Topics  . Alcohol use: Yes    Comment: Rare        Family History :     Family History  Problem Relation Age of Onset  . Hyperlipidemia Mother   . Hypertension Mother   . Cancer Father        skin  . Hyperlipidemia Father   . Dementia Father   . Arthritis Father   . Hypertension Father   . Sleep apnea Father   . Other Sister        Myelofibrosis  . Arthritis Sister   . Hyperlipidemia Sister   . Arthritis Sister   . Hyperlipidemia Sister   . Diabetes Paternal Grandfather   . Colon cancer Neg Hx       Home Medications:   Prior to Admission medications   Medication Sig Start Date End Date Taking? Authorizing Provider  ALPRAZolam (XANAX) 0.25 MG tablet Take 1 tablet (0.25 mg total) by mouth daily as needed for anxiety. 08/07/18  Yes Terald Sleeper, PA-C  amoxicillin-clavulanate (AUGMENTIN) 875-125 MG tablet Take 1 tablet by mouth 2 (two) times daily for 7 days. 01/25/19 02/01/19 Yes Gottschalk,  Leatrice Jewels M, DO  aspirin EC 325 MG tablet Take 325 mg by mouth daily.   Yes [provider]  busPIRone (BUSPAR) 15 MG tablet Take 1 tablet (15 mg total) by mouth 3 (three) times daily. 08/07/18  Yes Terald Sleeper, PA-C  Cholecalciferol 2000 units CAPS Take 1 capsule (2,000 Units total) by mouth daily. 08/17/16  Yes Beasley, Caren D, MD  Coenzyme Q10 (CO Q10) 100 MG CAPS Take 1 capsule by mouth daily.   Yes [provider]  DULoxetine (CYMBALTA) 30 MG capsule Take 2 capsules (60 mg total) by mouth daily. 01/01/19  Yes Terald Sleeper, PA-C  losartan-hydrochlorothiazide (HYZAAR) 100-25 MG tablet Take 1 tablet by mouth daily. 07/24/18  Yes Terald Sleeper, PA-C  metoprolol tartrate (LOPRESSOR) 25 MG tablet TAKE 1 TABLET BY MOUTH 2 TIMES DAILY. Patient taking differently: Take 25 mg by mouth 2 (two) times daily.  10/24/18  Yes Terald Sleeper, PA-C  metroNIDAZOLE (FLAGYL) 500 MG tablet Take 1 tablet (500 mg total) by mouth 3 (three) times daily. 01/25/19  Yes Gottschalk, Ashly M, DO  nitroGLYCERIN (NITROSTAT) 0.4 MG SL tablet 1 TABLET UNDER TONGUE EVERY 5 MINUTES UP TO 3 TIMES FOR CHEST PAIN THEN CALL DR IF NO RELIEF Patient taking differently: Place 0.4 mg under the tongue every 5 (five) minutes as needed for chest pain. 1 TABLET UNDER TONGUE EVERY 5 MINUTES UP TO 3 TIMES FOR CHEST PAIN THEN CALL DR IF NO RELIEF 02/23/18  Yes Terald Sleeper, PA-C  rosuvastatin (CRESTOR) 10 MG tablet Take 1 tablet (10 mg total) by mouth daily. 07/03/18  Yes Terald Sleeper, PA-C  terbinafine (LAMISIL) 250 MG tablet Take 1 tablet (250 mg total) by mouth daily. 12/19/18  Yes Terald Sleeper, PA-C  traZODone (DESYREL) 50 MG tablet Take 1-2 tablets (50-100 mg total) by mouth at bedtime as needed for sleep. 05/03/18  Yes Jones, Angel S, PA-C  VASCEPA 1 g CAPS TAKE 2 CAPULES BY MOUTH TWICE DAILY Patient taking differently: Take 2 capsules by mouth 2 (two) times a day.  07/11/18  Yes Terald Sleeper, PA-C  Vitamin D,  Ergocalciferol, (DRISDOL) 1.25 MG (50000 UT) CAPS capsule Take 1 capsule (50,000  Units total) by mouth every 7 (seven) days. Patient taking differently: Take 50,000 Units by mouth every Sunday.  08/30/18  Yes Terald Sleeper, PA-C  econazole nitrate 1 % cream Apply topically 2 (two) times daily. 12/19/18   Terald Sleeper, PA-C     Allergies:    No Known Allergies   Physical Exam:   Vitals  Blood pressure 117/60, pulse (!) 106, temperature 99.3 F (37.4 C), temperature source Oral, resp. rate (!) 21, last menstrual period 12/31/2013, SpO2 95 %.  1.  General: Appears in no acute distress  2. Psychiatric: Alert, oriented x3, intact insight and judgment  3. Neurologic: Cranial nerves II through grossly intact, motor strength 5/5 in all extremities  4. HEENMT:  Atraumatic normocephalic, extraocular muscle intact  5. Respiratory : Clear to auscultation bilaterally  6. Cardiovascular : S1-S2, regular, no murmur auscultated  7. Gastrointestinal:  Abdomen is soft, distended, left lower quadrant tenderness palpation, positive guarding and rigidity  8. Skin:  No rashes noted      Data Review:    CBC Recent Labs  Lab 01/25/19 0924 01/26/19 2032  WBC 17.3* 11.5*  HGB 14.3 14.1  HCT 42.2 41.9  PLT 228 214  MCV 83 84.8  MCH 28.2 28.5  MCHC 33.9 33.7  RDW 13.5 13.1  LYMPHSABS  --  0.8  MONOABS  --  0.4  EOSABS  --  0.0  BASOSABS  --  0.1   ------------------------------------------------------------------------------------------------------------------  Results for orders placed or performed during the hospital encounter of 01/26/19 (from the past 48 hour(s))  Comprehensive metabolic panel     Status: Abnormal   Collection Time: 01/26/19  8:32 PM  Result Value Ref Range   Sodium 132 (L) 135 - 145 mmol/L   Potassium 2.7 (LL) 3.5 - 5.1 mmol/L    Comment: CRITICAL RESULT CALLED TO, READ BACK BY AND VERIFIED WITH: OAKLEY,B ON 01/26/19 AT 2140 BY LOY,C    Chloride 97  (L) 98 - 111 mmol/L   CO2 24 22 - 32 mmol/L   Glucose, Bld 134 (H) 70 - 99 mg/dL   BUN 11 6 - 20 mg/dL   Creatinine, Ser 0.58 0.44 - 1.00 mg/dL   Calcium 8.5 (L) 8.9 - 10.3 mg/dL   Total Protein 7.4 6.5 - 8.1 g/dL   Albumin 3.5 3.5 - 5.0 g/dL   AST 14 (L) 15 - 41 U/L   ALT 14 0 - 44 U/L   Alkaline Phosphatase 69 38 - 126 U/L   Total Bilirubin 0.6 0.3 - 1.2 mg/dL   GFR calc non Af Amer >60 >60 mL/min   GFR calc Af Amer >60 >60 mL/min   Anion gap 11 5 - 15    Comment: Performed at [facility], [address] [place], [place] [address]  Lipase, blood     Status: None   Collection Time: 01/26/19  8:32 PM  Result Value Ref Range   Lipase 26 11 - 51 U/L    Comment: Performed at [facility], [address], [place], [place] [address]  CBC with Differential     Status: Abnormal   Collection Time: 01/26/19  8:32 PM  Result Value Ref Range   WBC 11.5 (H) 4.0 - 10.5 K/uL   RBC 4.94 3.87 - 5.11 MIL/uL   Hemoglobin 14.1 12.0 - 15.0 g/dL   HCT 41.9 36.0 - 46.0 %   MCV 84.8 80.0 - 100.0 fL   MCH 28.5 26.0 - 34.0 pg   MCHC  33.7 30.0 - 36.0 g/dL   RDW 13.1 11.5 - 15.5 %   Platelets 214 150 - 400 K/uL   nRBC 0.0 0.0 - 0.2 %   Neutrophils Relative % 89 %   Neutro Abs 10.2 (H) 1.7 - 7.7 K/uL   Lymphocytes Relative 7 %   Lymphs Abs 0.8 0.7 - 4.0 K/uL   Monocytes Relative 3 %   Monocytes Absolute 0.4 0.1 - 1.0 K/uL   Eosinophils Relative 0 %   Eosinophils Absolute 0.0 0.0 - 0.5 K/uL   Basophils Relative 1 %   Basophils Absolute 0.1 0.0 - 0.1 K/uL   Immature Granulocytes 0 %   Abs Immature Granulocytes 0.04 0.00 - 0.07 K/uL    Comment: Performed at [facility], [address], [place], [place] [address]  Magnesium     Status: Abnormal   Collection Time: 01/26/19  8:32 PM  Result Value Ref Range   Magnesium 1.6 (L) 1.7 - 2.4 mg/dL    Comment: Performed at [facility], [address] [place], [place] [address]  Protime-INR     Status: None   Collection Time: 01/26/19  8:32 PM  Result  Value Ref Range   Prothrombin Time 13.0 11.4 - 15.2 seconds   INR 1.0 0.8 - 1.2    Comment: (NOTE) INR goal varies based on device and disease states. Performed at [facility], [address] [place], [place] [address]   Lactic acid, plasma     Status: None   Collection Time: 01/26/19  8:33 PM  Result Value Ref Range   Lactic Acid, Venous 1.0 0.5 - 1.9 mmol/L    Comment: Performed at [facility], [address], [place], [place] [address]  Lactic acid, plasma     Status: None   Collection Time: 01/26/19 10:40 PM  Result Value Ref Range   Lactic Acid, Venous 1.0 0.5 - 1.9 mmol/L    Comment: Performed at [facility], [address] [place], [place] [address]  SARS Coronavirus 2 (CEPHEID - Performed in [place] hospital lab), Hosp Order     Status: None   Collection Time: 01/26/19 10:40 PM   Specimen: Nasopharyngeal Swab  Result Value Ref Range   SARS Coronavirus 2 NEGATIVE NEGATIVE    Comment: (NOTE) If result is NEGATIVE SARS-CoV-2 target nucleic acids are NOT DETECTED. The SARS-CoV-2 RNA is generally detectable in upper and lower  respiratory specimens during the acute phase of infection. The lowest  concentration of SARS-CoV-2 viral copies this assay can detect is 250  copies / mL. A negative result does not preclude SARS-CoV-2 infection  and should not be used as the sole basis for treatment or other  patient management decisions.  A negative result may occur with  improper specimen collection / handling, submission of specimen other  than nasopharyngeal swab, presence of viral mutation(s) within the  areas targeted by this assay, and inadequate number of viral copies  (<250 copies / mL). A negative result must be combined with clinical  observations, patient history, and epidemiological information. If result is POSITIVE SARS-CoV-2 target nucleic acids are DETECTED. The SARS-CoV-2 RNA is generally detectable in upper and lower  respiratory specimens dur ing the acute  phase of infection.  Positive  results are indicative of active infection with SARS-CoV-2.  Clinical  correlation with patient history and other diagnostic information is  necessary to determine patient infection status.  Positive results do  not rule out bacterial infection or co-infection with other viruses. If  result is PRESUMPTIVE POSTIVE SARS-CoV-2 nucleic acids MAY BE PRESENT.   A presumptive positive result was obtained on the submitted specimen  and confirmed on repeat testing.  While 2019 novel coronavirus  (SARS-CoV-2) nucleic acids may be present in the submitted sample  additional confirmatory testing may be necessary for epidemiological  and / or clinical management purposes  to differentiate between  SARS-CoV-2 and other Sarbecovirus currently known to infect humans.  If clinically indicated additional testing with an alternate test  methodology [id number]) is advised. The SARS-CoV-2 RNA is generally  detectable in upper and lower respiratory sp ecimens during the acute  phase of infection. The expected result is Negative. Fact Sheet for Patients:  StrictlyIdeas.no Fact Sheet for Healthcare Providers: BankingDealers.co.za This test is not yet approved or cleared by the [place] FDA and has been authorized for detection and/or diagnosis of SARS-CoV-2 by FDA under an Emergency Use Authorization (EUA).  This EUA will remain in effect (meaning this test can be used) for the duration of the COVID-19 declaration under Section 564(b)(1) of the Act, 21 U.S.C. section 360bbb-3(b)(1), unless the authorization is terminated or revoked sooner. Performed at [facility], [address], [place], [place] [address]     Chemistries  Recent Labs  Lab 01/25/19 [phone] 01/26/19 2032  NA 140 132*  K 3.9 2.7*  CL 96 97*  CO2 26 24  GLUCOSE 100* 134*  BUN 8 11  CREATININE 0.76 0.58  CALCIUM 9.6 8.5*  MG  --  1.6*  AST 15 14*  ALT 13  14  ALKPHOS 74 69  BILITOT 0.4 0.6   ------------------------------------------------------------------------------------------------------------------  ------------------------------------------------------------------------------------------------------------------ GFR: Estimated Creatinine Clearance: 81.3 mL/min (by C-G formula based on SCr of 0.58 mg/dL). Liver Function Tests: Recent Labs  Lab 01/25/19 0924 01/26/19 2032  AST 15 14*  ALT 13 14  ALKPHOS 74 69  BILITOT 0.4 0.6  PROT 6.8 7.4  ALBUMIN 4.0 3.5   Recent Labs  Lab 01/26/19 2032  LIPASE 26   No results for input(s): AMMONIA in the last 168 hours. Coagulation Profile: Recent Labs  Lab 01/26/19 2032  INR 1.0   Cardiac Enzymes: No results for input(s): CKTOTAL, CKMB, CKMBINDEX, TROPONINI in the last 168 hours. BNP (last 3 results) No results for input(s): PROBNP in the last 8760 hours. HbA1C: No results for input(s): HGBA1C in the last 72 hours. CBG: No results for input(s): GLUCAP in the last 168 hours. Lipid Profile: No results for input(s): CHOL, HDL, LDLCALC, TRIG, CHOLHDL, LDLDIRECT in the last 72 hours. Thyroid Function Tests: No results for input(s): TSH, T4TOTAL, FREET4, T3FREE, THYROIDAB in the last 72 hours. Anemia Panel: No results for input(s): VITAMINB12, FOLATE, FERRITIN, TIBC, IRON, RETICCTPCT in the last 72 hours.  --------------------------------------------------------------------------------------------------------------- Urine analysis:    Component Value Date/Time   COLORURINE YELLOW 11/19/2016 2357   APPEARANCEUR CLEAR 11/19/2016 2357   APPEARANCEUR Clear 02/25/2016 0937   LABSPEC >1.046 (H) 11/19/2016 2357   PHURINE 7.0 11/19/2016 2357   GLUCOSEU NEGATIVE 11/19/2016 2357   HGBUR MODERATE (A) 11/19/2016 2357   BILIRUBINUR NEGATIVE 11/19/2016 2357   BILIRUBINUR Negative 02/25/2016 [place] 11/19/2016 2357   PROTEINUR 30 (A) 11/19/2016 2357   UROBILINOGEN  negative 03/24/2015 0937   UROBILINOGEN 0.2 05/26/2012 1230   NITRITE NEGATIVE 11/19/2016 2357   LEUKOCYTESUR NEGATIVE 11/19/2016 2357   LEUKOCYTESUR Negative 02/25/2016 0937      Imaging Results:    Ct Abdomen Pelvis W Contrast  Result Date: 01/26/2019 CLINICAL DATA:  Abdominal pain with diverticulitis suspected. EXAM: CT ABDOMEN AND PELVIS WITH CONTRAST TECHNIQUE: Multidetector CT imaging of the abdomen and pelvis was performed using the standard protocol following bolus administration of intravenous contrast. CONTRAST:  170mL OMNIPAQUE IOHEXOL 300 MG/ML  SOLN COMPARISON:  CT of the pelvis dated 11/19/2016 FINDINGS: Lower chest: The heart size is mildly enlarged. Hepatobiliary: No focal liver abnormality is seen. No gallstones, gallbladder wall thickening, or biliary dilatation. There is likely underlying hepatic steatosis. Pancreas: Unremarkable. No pancreatic ductal dilatation or surrounding inflammatory changes. Spleen: Normal in size without focal abnormality. Adrenals/Urinary Tract: There is a stable peripherally calcified cyst involving the left kidney. There is no hydronephrosis. The adrenal glands are unremarkable. There is a relatively stable hyperdense cyst involving the anterior interpolar region of the right kidney. There are punctate nonobstructing stones in the lower pole of the right kidney. The bladder is unremarkable. Stomach/Bowel: There are findings of sigmoid diverticulitis complicated by a 3.8 x 3.3 cm abscess (axial series 2, image 71). There is some reactive free fluid in the pelvis with questionable rim enhancement. There is severe sigmoid diverticulosis. The appendix is not reliably identified. There is a stable fat containing intraluminal lesion within the proximal jejunum. The stomach is unremarkable. Vascular/Lymphatic: Aortic atherosclerosis. No enlarged abdominal or pelvic lymph nodes. Reproductive: There is a partially calcified fibroid within the uterus. There is no  evidence for an ovarian mass, however evaluation is somewhat limited by free fluid in the patient's pelvis. Other: There is no abdominal wall hernia. Musculoskeletal: No acute or significant osseous findings. IMPRESSION: 1. Findings consistent with acute complicated sigmoid diverticulitis. There is a 3.8 cm abscess adjacent to the sigmoid colon. There is a small to moderate volume of fluid within the pelvis with questionable rim enhancement. 2. Severe sigmoid diverticulosis. 3. Hepatic steatosis. 4. Nonobstructing stones in the lower pole the right kidney. 5.  Aortic Atherosclerosis (ICD10-I70.0). Electronically Signed   By: Constance Holster M.D.   On: 01/26/2019 22:25       Assessment & Plan:    Active Problems:   Diverticulitis   1. Complicated sigmoid diverticulitis-CT abdomen showed acute complicated sigmoid diverticulitis.  3.8 cm abscess adjacent to sigmoid colon noted.  General surgery has been consulted.  Continue with Cipro and Flagyl.  We will keep her n.p.o.  IV normal saline at 100 mL/h.  Patient will likely need CT-guided drainage of the abscess per IR.  Will follow recommendations by general surgery.  2. Hypokalemia-potassium is 2.7.  Will replace potassium and check BMP in a.m.  3. Hypomagnesemia-magnesium was noted to be 1.6.  2 g of mag sulfate given in the ED.  4. CAD status post stent placement-stable, continue aspirin, Lopressor, Crestor.  5. Hypertension-blood pressure stable, continue Hyzaar, Lopressor.  6. Depression-continue Cymbalta, BuSpar    DVT Prophylaxis-   Lovenox   AM Labs Ordered, also please review Full Orders  Family Communication: Admission, patients condition and plan of care including tests being ordered have been discussed with the patient who indicate understanding and agree with the plan and Code Status.  Code Status: Full code  Admission status: Inpatient: Based on patients clinical presentation and evaluation of above clinical data, I have  made determination that patient meets Inpatient criteria at this time.  Time spent in minutes : 60 minutes   Oswald Hillock M.D on 01/27/2019 at 12:14 AM

## 2019-01-28 LAB — COMPREHENSIVE METABOLIC PANEL
ALT: 11 U/L (ref 0–44)
AST: 12 U/L — ABNORMAL LOW (ref 15–41)
Albumin: 3 g/dL — ABNORMAL LOW (ref 3.5–5.0)
Alkaline Phosphatase: 63 U/L (ref 38–126)
Anion gap: 8 (ref 5–15)
BUN: 10 mg/dL (ref 6–20)
CO2: 26 mmol/L (ref 22–32)
Calcium: 7.7 mg/dL — ABNORMAL LOW (ref 8.9–10.3)
Chloride: 100 mmol/L (ref 98–111)
Creatinine, Ser: 0.65 mg/dL (ref 0.44–1.00)
GFR calc Af Amer: >60 mL/min (ref >60)
GFR calc non Af Amer: >60 mL/min (ref >60)
Glucose, Bld: 95 mg/dL (ref 70–99)
Potassium: 3.1 mmol/L — ABNORMAL LOW (ref 3.5–5.1)
Sodium: 134 mmol/L — ABNORMAL LOW (ref 135–145)
Total Bilirubin: 0.4 mg/dL (ref 0.3–1.2)
Total Protein: 6.3 g/dL — ABNORMAL LOW (ref 6.5–8.1)

## 2019-01-28 LAB — CBC WITH DIFFERENTIAL/PLATELET
Abs Immature Granulocytes: 0.12 10*3/uL — ABNORMAL HIGH (ref 0.00–0.07)
Basophils Absolute: 0.1 10*3/uL (ref 0.0–0.1)
Basophils Relative: 0 %
Eosinophils Absolute: 0.2 10*3/uL (ref 0.0–0.5)
Eosinophils Relative: 1 %
HCT: 40.7 % (ref 36.0–46.0)
Hemoglobin: 12.7 g/dL (ref 12.0–15.0)
Immature Granulocytes: 1 %
Lymphocytes Relative: 9 %
Lymphs Abs: 1.6 10*3/uL (ref 0.7–4.0)
MCH: 27.7 pg (ref 26.0–34.0)
MCHC: 31.2 g/dL (ref 30.0–36.0)
MCV: 88.7 fL (ref 80.0–100.0)
Monocytes Absolute: 0.5 10*3/uL (ref 0.1–1.0)
Monocytes Relative: 3 %
Neutro Abs: 14.6 10*3/uL — ABNORMAL HIGH (ref 1.7–7.7)
Neutrophils Relative %: 86 %
Platelets: 249 10*3/uL (ref 150–400)
RBC: 4.59 MIL/uL (ref 3.87–5.11)
RDW: 13.6 % (ref 11.5–15.5)
WBC: 17 10*3/uL — ABNORMAL HIGH (ref 4.0–10.5)
nRBC: 0 % (ref 0.0–0.2)

## 2019-01-28 LAB — HIV ANTIBODY (ROUTINE TESTING W REFLEX): HIV Screen 4th Generation wRfx: NONREACTIVE

## 2019-01-28 LAB — PROTIME-INR
INR: 1.3 — ABNORMAL HIGH (ref 0.8–1.2)
Prothrombin Time: 15.9 seconds — ABNORMAL HIGH (ref 11.4–15.2)

## 2019-01-28 LAB — MAGNESIUM: Magnesium: 2.1 mg/dL (ref 1.7–2.4)

## 2019-01-28 MED ORDER — POTASSIUM CHLORIDE CRYS ER 20 MEQ PO TBCR
40.0000 meq | EXTENDED_RELEASE_TABLET | Freq: Once | ORAL | Status: AC
  Administered 2019-01-28: 40 meq via ORAL
  Filled 2019-01-28: qty 2

## 2019-01-28 NOTE — Progress Notes (Signed)
Night RN received call from [facility] stating that they would not be able to take patient to interventional radiology and that RN would need to call EMS. EMS informed RN that they would not be able to take patient either, and pt would have to be rescheduled. This RN attempted to contact interventional radiology and [phone] and [phone] and left messages at both numbers. Will make MD aware of delay.

## 2019-01-28 NOTE — Progress Notes (Signed)
Per RN at [facility] unable to transport patient to [facility] for planned procedure today for unclear reasons.  Will re-schedule patient to arrive at [facility] from [facility] at 0830 on 6/29 via [facility] for abdominal abscess aspiration and possible drain placement. I will place new orders for NPO after midnight, continue to hold Lovenox/heparin until post procedure. Full consult/consent when patient arrives to IR. This has been communicated to [facility] today who will arrange transport.   Please call IR with questions or concerns.  Candiss Norse, PA-C

## 2019-01-28 NOTE — Plan of Care (Signed)

## 2019-01-28 NOTE — Progress Notes (Signed)
Spoke with pt daughter Heather Lam who expressed dissatisfaction with her mother's procedure being rescheduled. Explained to her the reasoning behind it, and patient's daughter stated that she would prefer her mother to go to [facility], or to come and get her mother herself. Spoke with patient and confirmed that it was her desire to transfer to [facility] to avoid further delays in her treatment. Relayed information to Dr. Wynetta Emery and transfer was arranged. Called report to 6N, spoke with Levada Dy.

## 2019-01-28 NOTE — Progress Notes (Signed)
Patient arrived to [phone] via [facility] from [facility].  Patient is A&Ox4, VSS, and self ambulates.  Patient oriented to room and equipment.  Will continue to monitor.

## 2019-01-28 NOTE — Progress Notes (Signed)
PROGRESS NOTE Heather Lam  CHY:[id number]  DOB: [birth year]  DOA: 01/26/2019 PCP: Terald Sleeper, PA-C  Brief Admission Hx: 58 year old female with severe diverticulosis with multiple bouts of diverticulitis, CAD status post stent placement 2010, hypertension, hyperlipidemia and nephrolithiasis presented with severe left lower quadrant abdominal pain progressive over the last 5 days associated with diarrhea.  The patient was found to have severe sigmoid diverticulitis with abscess.  The patient is being transferred to [facility] for IR drainage of the abscess scheduled for 01/29/2019 early morning.  MDM/Assessment & Plan:   1. Complicated sigmoid diverticulitis with abscess- appreciate general surgery consultation and they have made arrangements for the patient to have IR drainage of the abscess and it is scheduled for 01/29/19 1st case.  The patient would like to be transferred to [facility] today as there were some problems with [facility] transportation today causing her appointment for today to be rescheduled.  Pt is hemodynamically stable and pain is controlled with pain medications.  2. Hypokalemia - replacement ordered.  Follow. 3. Hypomagnesemia - This has been repleted with IV magnesium.  4. CAD s/p stent placement - stable, resumed home cardiac medications lopressor, crestor, aspirin.  5. Essential hypertension - resumed home blood pressure medications.  6. Depression - resumed home cymbalta, buspar.   DVT prophylaxis: lovenox  Code Status: Full  Family Communication: patient updated at bedside Disposition Plan: transfer to [place], med- surg bed  Consultants:  General surgery Arnoldo Morale)  Procedures:  Tentative plan for IR abscess drainage 01/29/19  Antimicrobials:  Ciprofloxacin 6/27 >  Metronidazole 6/27 >   Subjective: Pt reports that pain is controlled.  Pt denies emesis, diarrhea, no black stools, no chest pain, no SOB.   Objective: Vitals:   01/27/19  2044 01/28/19 0526 01/28/19 0730 01/28/19 0912  BP: (!) 108/56 119/61  (!) 131/56  Pulse: 77 95  91  Resp: 18 16    Temp: 99 F (37.2 C) 99.7 F (37.6 C)  99.3 F (37.4 C)  TempSrc: Oral Oral  Oral  SpO2: 91% 96% 97% 94%  Weight:      Height:        Intake/Output Summary (Last 24 hours) at 01/28/2019 1129 Last data filed at 01/28/2019 8786 Gross per 24 hour  Intake 1903.05 ml  Output -  Net 1903.05 ml   Filed Weights   01/27/19 0138  Weight: 92.3 kg   REVIEW OF SYSTEMS  As per history otherwise all reviewed and reported negative  Exam:  General exam: awake, alert, NAD, cooperative.  Respiratory system: Clear. No increased work of breathing. Cardiovascular system: S1 & S2 heard. No JVD, murmurs, gallops, clicks or pedal edema. Gastrointestinal system: Abdomen is nondistended, soft and generalized tenderness LLQ with guarding. Normal bowel sounds heard. Central nervous system: Alert and oriented. No focal neurological deficits. Extremities: no cyanosis or clubbing.  Data Reviewed: Basic Metabolic Panel: Recent Labs  Lab 01/25/19 0924 01/26/19 2032 01/27/19 0718 01/28/19 0542  NA 140 132* 134* 134*  K 3.9 2.7* 3.5 3.1*  CL 96 97* 99 100  CO2 26 24 26 26   GLUCOSE 100* 134* 109* 95  BUN 8 11 11 10   CREATININE 0.76 0.58 0.57 0.65  CALCIUM 9.6 8.5* 7.7* 7.7*  MG  --  1.6*  --  2.1   Liver Function Tests: Recent Labs  Lab 01/25/19 0924 01/26/19 2032 01/27/19 0718 01/28/19 0542  AST 15 14* 10* 12*  ALT 13 14 12 11   ALKPHOS 74  69 53 63  BILITOT 0.4 0.6 0.6 0.4  PROT 6.8 7.4 5.9* 6.3*  ALBUMIN 4.0 3.5 2.8* 3.0*   Recent Labs  Lab 01/26/19 2032  LIPASE 26   No results for input(s): AMMONIA in the last 168 hours. CBC: Recent Labs  Lab 01/25/19 0924 01/26/19 2032 01/27/19 0718 01/28/19 0542  WBC 17.3* 11.5* 15.4* 17.0*  NEUTROABS  --  10.2*  --  14.6*  HGB 14.3 14.1 12.4 12.7  HCT 42.2 41.9 37.9 40.7  MCV 83 84.8 86.9 88.7  PLT 228 214 217 249    Cardiac Enzymes: No results for input(s): CKTOTAL, CKMB, CKMBINDEX, TROPONINI in the last 168 hours. CBG (last 3)  No results for input(s): GLUCAP in the last 72 hours. Recent Results (from the past 240 hour(s))  SARS Coronavirus 2 (CEPHEID - Performed in [place] hospital lab), Hosp Order     Status: None   Collection Time: 01/26/19 10:40 PM   Specimen: Nasopharyngeal Swab  Result Value Ref Range Status   SARS Coronavirus 2 NEGATIVE NEGATIVE Final    Comment: (NOTE) If result is NEGATIVE SARS-CoV-2 target nucleic acids are NOT DETECTED. The SARS-CoV-2 RNA is generally detectable in upper and lower  respiratory specimens during the acute phase of infection. The lowest  concentration of SARS-CoV-2 viral copies this assay can detect is 250  copies / mL. A negative result does not preclude SARS-CoV-2 infection  and should not be used as the sole basis for treatment or other  patient management decisions.  A negative result may occur with  improper specimen collection / handling, submission of specimen other  than nasopharyngeal swab, presence of viral mutation(s) within the  areas targeted by this assay, and inadequate number of viral copies  (<250 copies / mL). A negative result must be combined with clinical  observations, patient history, and epidemiological information. If result is POSITIVE SARS-CoV-2 target nucleic acids are DETECTED. The SARS-CoV-2 RNA is generally detectable in upper and lower  respiratory specimens dur ing the acute phase of infection.  Positive  results are indicative of active infection with SARS-CoV-2.  Clinical  correlation with patient history and other diagnostic information is  necessary to determine patient infection status.  Positive results do  not rule out bacterial infection or co-infection with other viruses. If result is PRESUMPTIVE POSTIVE SARS-CoV-2 nucleic acids MAY BE PRESENT.   A presumptive positive result was obtained on the  submitted specimen  and confirmed on repeat testing.  While 2019 novel coronavirus  (SARS-CoV-2) nucleic acids may be present in the submitted sample  additional confirmatory testing may be necessary for epidemiological  and / or clinical management purposes  to differentiate between  SARS-CoV-2 and other Sarbecovirus currently known to infect humans.  If clinically indicated additional testing with an alternate test  methodology [phone]) is advised. The SARS-CoV-2 RNA is generally  detectable in upper and lower respiratory sp ecimens during the acute  phase of infection. The expected result is Negative. Fact Sheet for Patients:  StrictlyIdeas.no Fact Sheet for Healthcare Providers: BankingDealers.co.za This test is not yet approved or cleared by the [place] FDA and has been authorized for detection and/or diagnosis of SARS-CoV-2 by FDA under an Emergency Use Authorization (EUA).  This EUA will remain in effect (meaning this test can be used) for the duration of the COVID-19 declaration under Section 564(b)(1) of the Act, 21 U.S.C. section 360bbb-3(b)(1), unless the authorization is terminated or revoked sooner. Performed at [facility], [place]  [address], [place], [place] [address]      Studies: Ct Abdomen Pelvis W Contrast  Result Date: 01/26/2019 CLINICAL DATA:  Abdominal pain with diverticulitis suspected. EXAM: CT ABDOMEN AND PELVIS WITH CONTRAST TECHNIQUE: Multidetector CT imaging of the abdomen and pelvis was performed using the standard protocol following bolus administration of intravenous contrast. CONTRAST:  110mL OMNIPAQUE IOHEXOL 300 MG/ML  SOLN COMPARISON:  CT of the pelvis dated 11/19/2016 FINDINGS: Lower chest: The heart size is mildly enlarged. Hepatobiliary: No focal liver abnormality is seen. No gallstones, gallbladder wall thickening, or biliary dilatation. There is likely underlying hepatic steatosis. Pancreas:  Unremarkable. No pancreatic ductal dilatation or surrounding inflammatory changes. Spleen: Normal in size without focal abnormality. Adrenals/Urinary Tract: There is a stable peripherally calcified cyst involving the left kidney. There is no hydronephrosis. The adrenal glands are unremarkable. There is a relatively stable hyperdense cyst involving the anterior interpolar region of the right kidney. There are punctate nonobstructing stones in the lower pole of the right kidney. The bladder is unremarkable. Stomach/Bowel: There are findings of sigmoid diverticulitis complicated by a 3.8 x 3.3 cm abscess (axial series 2, image 71). There is some reactive free fluid in the pelvis with questionable rim enhancement. There is severe sigmoid diverticulosis. The appendix is not reliably identified. There is a stable fat containing intraluminal lesion within the proximal jejunum. The stomach is unremarkable. Vascular/Lymphatic: Aortic atherosclerosis. No enlarged abdominal or pelvic lymph nodes. Reproductive: There is a partially calcified fibroid within the uterus. There is no evidence for an ovarian mass, however evaluation is somewhat limited by free fluid in the patient's pelvis. Other: There is no abdominal wall hernia. Musculoskeletal: No acute or significant osseous findings. IMPRESSION: 1. Findings consistent with acute complicated sigmoid diverticulitis. There is a 3.8 cm abscess adjacent to the sigmoid colon. There is a small to moderate volume of fluid within the pelvis with questionable rim enhancement. 2. Severe sigmoid diverticulosis. 3. Hepatic steatosis. 4. Nonobstructing stones in the lower pole the right kidney. 5.  Aortic Atherosclerosis (ICD10-I70.0). Electronically Signed   By: Constance Holster M.D.   On: 01/26/2019 22:25   Scheduled Meds: . aspirin EC  325 mg Oral Daily  . busPIRone  15 mg Oral TID  . DULoxetine  60 mg Oral Daily  . guaiFENesin  600 mg Oral BID  . metoprolol tartrate  25 mg Oral  BID  . potassium chloride  40 mEq Oral Once  . rosuvastatin  10 mg Oral Daily   Continuous Infusions: . sodium chloride 150 mL/hr at 01/27/19 1944  . ciprofloxacin 400 mg (01/28/19 0921)  . metronidazole 500 mg (01/28/19 0622)    Active Problems:   Acute diverticulitis   Diverticulitis   Hypokalemia   Hypomagnesemia   Intra-abdominal abscess (HCC)   Left lower quadrant abdominal pain  Time spent:   Irwin Brakeman, MD [facility] 01/28/2019, 11:29 AM    LOS: 1 day  How to contact the [facility] Attending or Consulting provider [place] or covering provider during after hours [place], for this patient?  1. Check the care team in [facility] and look for a) attending/consulting [facility] provider listed and b) the [facility] team listed 2. Log into [email or web address] and use [place]'s universal password to access. If you do not have the password, please contact the hospital operator. 3. Locate the [facility] provider you are looking for under [facility] and page to a number that you can be directly reached. 4. If you still have difficulty reaching the  provider, please page the [facility] (Director on Call) for the Hospitalists listed on amion for assistance.

## 2019-01-29 ENCOUNTER — Inpatient Hospital Stay (HOSPITAL_COMMUNITY): Payer: No Typology Code available for payment source

## 2019-01-29 DIAGNOSIS — E7800 Pure hypercholesterolemia, unspecified: Secondary | ICD-10-CM

## 2019-01-29 DIAGNOSIS — G4700 Insomnia, unspecified: Secondary | ICD-10-CM

## 2019-01-29 DIAGNOSIS — G4733 Obstructive sleep apnea (adult) (pediatric): Secondary | ICD-10-CM

## 2019-01-29 DIAGNOSIS — F411 Generalized anxiety disorder: Secondary | ICD-10-CM

## 2019-01-29 DIAGNOSIS — E669 Obesity, unspecified: Secondary | ICD-10-CM

## 2019-01-29 DIAGNOSIS — F320 Major depressive disorder, single episode, mild: Secondary | ICD-10-CM

## 2019-01-29 DIAGNOSIS — I252 Old myocardial infarction: Secondary | ICD-10-CM

## 2019-01-29 DIAGNOSIS — I10 Essential (primary) hypertension: Secondary | ICD-10-CM

## 2019-01-29 DIAGNOSIS — Z9989 Dependence on other enabling machines and devices: Secondary | ICD-10-CM

## 2019-01-29 DIAGNOSIS — F3341 Major depressive disorder, recurrent, in partial remission: Secondary | ICD-10-CM

## 2019-01-29 LAB — CBC WITH DIFFERENTIAL/PLATELET
Abs Immature Granulocytes: 0.1 10*3/uL — ABNORMAL HIGH (ref 0.00–0.07)
Basophils Absolute: 0.1 10*3/uL (ref 0.0–0.1)
Basophils Relative: 0 %
Eosinophils Absolute: 0.1 10*3/uL (ref 0.0–0.5)
Eosinophils Relative: 1 %
HCT: 34.3 % — ABNORMAL LOW (ref 36.0–46.0)
Hemoglobin: 11.4 g/dL — ABNORMAL LOW (ref 12.0–15.0)
Immature Granulocytes: 1 %
Lymphocytes Relative: 7 %
Lymphs Abs: 1.3 10*3/uL (ref 0.7–4.0)
MCH: 28.5 pg (ref 26.0–34.0)
MCHC: 33.2 g/dL (ref 30.0–36.0)
MCV: 85.8 fL (ref 80.0–100.0)
Monocytes Absolute: 0.8 10*3/uL (ref 0.1–1.0)
Monocytes Relative: 4 %
Neutro Abs: 16.7 10*3/uL — ABNORMAL HIGH (ref 1.7–7.7)
Neutrophils Relative %: 87 %
Platelets: 240 10*3/uL (ref 150–400)
RBC: 4 MIL/uL (ref 3.87–5.11)
RDW: 13.5 % (ref 11.5–15.5)
WBC: 19.1 10*3/uL — ABNORMAL HIGH (ref 4.0–10.5)
nRBC: 0 % (ref 0.0–0.2)

## 2019-01-29 LAB — COMPREHENSIVE METABOLIC PANEL
ALT: 9 U/L (ref 0–44)
AST: 10 U/L — ABNORMAL LOW (ref 15–41)
Albumin: 2.2 g/dL — ABNORMAL LOW (ref 3.5–5.0)
Alkaline Phosphatase: 69 U/L (ref 38–126)
Anion gap: 7 (ref 5–15)
BUN: 6 mg/dL (ref 6–20)
CO2: 26 mmol/L (ref 22–32)
Calcium: 7.6 mg/dL — ABNORMAL LOW (ref 8.9–10.3)
Chloride: 99 mmol/L (ref 98–111)
Creatinine, Ser: 0.7 mg/dL (ref 0.44–1.00)
GFR calc Af Amer: >60 mL/min (ref >60)
GFR calc non Af Amer: >60 mL/min (ref >60)
Glucose, Bld: 110 mg/dL — ABNORMAL HIGH (ref 70–99)
Potassium: 4.3 mmol/L (ref 3.5–5.1)
Sodium: 132 mmol/L — ABNORMAL LOW (ref 135–145)
Total Bilirubin: 0.2 mg/dL — ABNORMAL LOW (ref 0.3–1.2)
Total Protein: 5.5 g/dL — ABNORMAL LOW (ref 6.5–8.1)

## 2019-01-29 LAB — MAGNESIUM: Magnesium: 1.9 mg/dL (ref 1.7–2.4)

## 2019-01-29 MED ORDER — MIDAZOLAM HCL 2 MG/2ML IJ SOLN
INTRAMUSCULAR | Status: AC | PRN
  Administered 2019-01-29 (×2): 1 mg via INTRAVENOUS

## 2019-01-29 MED ORDER — METOPROLOL TARTRATE 5 MG/5ML IV SOLN
5.0000 mg | Freq: Four times a day (QID) | INTRAVENOUS | Status: DC
  Administered 2019-01-29 – 2019-01-31 (×8): 5 mg via INTRAVENOUS
  Filled 2019-01-29 (×8): qty 5

## 2019-01-29 MED ORDER — LORAZEPAM 2 MG/ML IJ SOLN: 0.5000 mg | INTRAMUSCULAR | Status: DC | PRN

## 2019-01-29 MED ORDER — LIDOCAINE HCL 1 % IJ SOLN
INTRAMUSCULAR | Status: AC
  Filled 2019-01-29: qty 20

## 2019-01-29 MED ORDER — FENTANYL CITRATE (PF) 100 MCG/2ML IJ SOLN
INTRAMUSCULAR | Status: AC
  Filled 2019-01-29: qty 6

## 2019-01-29 MED ORDER — SODIUM CHLORIDE 0.9% FLUSH
5.0000 mL | Freq: Three times a day (TID) | INTRAVENOUS | Status: DC
  Administered 2019-01-29 – 2019-02-09 (×30): 5 mL

## 2019-01-29 MED ORDER — ASPIRIN EC 325 MG PO TBEC
325.0000 mg | DELAYED_RELEASE_TABLET | Freq: Every day | ORAL | Status: DC
  Administered 2019-01-30 – 2019-02-06 (×8): 325 mg via ORAL
  Filled 2019-01-29 (×8): qty 1

## 2019-01-29 MED ORDER — FENTANYL CITRATE (PF) 100 MCG/2ML IJ SOLN
INTRAMUSCULAR | Status: AC | PRN
  Administered 2019-01-29 (×2): 50 ug via INTRAVENOUS

## 2019-01-29 MED ORDER — MIDAZOLAM HCL 2 MG/2ML IJ SOLN
INTRAMUSCULAR | Status: AC
  Filled 2019-01-29: qty 6

## 2019-01-29 NOTE — Progress Notes (Signed)
Pt alert and oriented x4, no complaints of pain or discomfort.  Bed in low position, call bell within reach.  Bed alarms on and functioning.  Assessment done and charted.  Will continue to monitor and do hourly rounding throughout the shift 

## 2019-01-29 NOTE — Procedures (Signed)
Interventional Radiology Procedure Note  Procedure: Placement of a 65F drain into LLQ diverticular abscess.  Aspiration yields 25 mL thick purulent fluid.    Complications: None  Estimated Blood Loss: None  Recommendations: - Drain to JP - Flush once per shift - CUltures pending  Signed,  Criselda Peaches, MD

## 2019-01-29 NOTE — Progress Notes (Signed)
MEDICATION-RELATED CONSULT NOTE   IR Procedure Consult - Anticoagulant/Antiplatelet PTA/Inpatient Med List Review by Pharmacist    Procedure: Placement of a 70F drain into LLQ diverticular abscess. Aspiration of 25 mL thick purulent fluid.    Completed: 01/29/2019 at 12:13PM  Post-Procedural bleeding risk per IR MD assessment: low  Antithrombotic medications on inpatient or PTA profile prior to procedure:     EC Aspirin 325mg  daily-last dose given 01/28/2019 at 0908   Enoxaparin 40mg  SQ q24h was discontinued by surgery on 01/27/2019  Recommended restart time per IR Post-Procedure Guidelines:  Aspirin in doses > 81mg  daily: resume following morning LMWH (prophylactic dose): can resume day of procedure, at least 4 hours after procedure   Plan:      Resume EC Aspirin 325mg  PO daily 01/30/2019 AM  Discussed DVT prophylaxis with Dr. Johnny Bridge SCDs for now rather than resuming LMWH at this time   Lindell Spar, PharmD, BCPS Clinical Pharmacist  01/29/2019 2:58 PM

## 2019-01-29 NOTE — Consult Note (Signed)
[facility] Surgery Consult Note  Heather Lam April 23, 1961  [id number].    Requesting MD: Orlean Patten Chief Complaint/Reason for Consult: diverticulitis  HPI:  Heather Lam is a 58yo female PMH HTN, HLD, h/o MI s/p stent placement x2 2010 on aspirin 325mg , and kidney stones, who was transferred from [facility] to [facility] 6/27 with sigmoid diverticulitis. Patient states that she has had 4-5 episodes of diverticulitis over the last 5 years, but has never required hospitalization. This episodes began nearly 1 week ago with lower abdominal pain. She saw her PCP who clinically suspected diverticulitis and started her on Augmentin and Flagyl. Unfortunately her pain gradually worsened and was associated with nausea, bloating, fever up to 100, chills, suppressed appetite, and non-bloody diarrhea. She went to the ED where she had a CT scan that revealed severe sigmoid diverticulosis, sigmoid diverticulitis with a 3.8 cm abscess and small to moderate volume of fluid within the pelvis with questionable rim enhancement. Patient was admitted and started on cipro/flagyl. Initial WBC 11.5 and rose to 19.1 today. She was transferred to [facility] for drain placement in IR. This occurred today and yielded 92mL purulent fluid. General surgery asked to see.  - Abdominal surgical history: open appendectomy, tubal ligation - Last colonoscopy 2013 by Dr. Hilarie Fredrickson showed mild diverticulosis in the left colon - Anticoagulants: none - Quit smoking 10 years ago, then started back about 1 year ago and smokes 1/2 PPD - Employment: Therapist, sports for outpatient clinic  ROS: Review of Systems  Constitutional: Positive for chills and fever.  HENT: Negative.   Eyes: Negative.   Respiratory: Negative.   Cardiovascular: Negative.   Gastrointestinal: Positive for abdominal pain, diarrhea and nausea. Negative for blood in stool, constipation and vomiting.  Genitourinary: Negative.   Musculoskeletal: Negative.   Skin: Negative.    Neurological: Negative.    All systems reviewed and otherwise negative except for as above  Family History  Problem Relation Age of Onset  . Hyperlipidemia Mother   . Hypertension Mother   . Cancer Father        skin  . Hyperlipidemia Father   . Dementia Father   . Arthritis Father   . Hypertension Father   . Sleep apnea Father   . Other Sister        Myelofibrosis  . Arthritis Sister   . Hyperlipidemia Sister   . Arthritis Sister   . Hyperlipidemia Sister   . Diabetes Paternal Grandfather   . Colon cancer Neg Hx     Past Medical History:  Diagnosis Date  . Anxiety   . Back pain   . CAD (coronary artery disease)   . Chest pain   . Complication of anesthesia   . Constipation   . Diverticulitis   . Diverticulosis   . Heart attack ([place]) 2010  . History of kidney stones   . Hyperlipemia   . Hypertension   . Joint pain   . PONV (postoperative nausea and vomiting)   . Skin cancer    on nose  . Sleep apnea    wears CPAP nightly  . Vitamin D deficiency     Past Surgical History:  Procedure Laterality Date  . APPENDECTOMY    . BACK SURGERY    . CARPAL TUNNEL RELEASE Right   . CARPAL TUNNEL RELEASE Left 08/13/2016   Procedure: LEFT CARPAL TUNNEL RELEASE;  Surgeon: Roseanne Kaufman, MD;  Location: [place];  Service: Orthopedics;  Laterality: Left;  . CORONARY STENT PLACEMENT  2 stents in the same artery  . ENDOMETRIAL ABLATION  2005  . EXTRACORPOREAL SHOCK WAVE LITHOTRIPSY Right 06/27/2017   Procedure: RIGHT EXTRACORPOREAL SHOCK WAVE LITHOTRIPSY (ESWL);  Surgeon: Franchot Gallo, MD;  Location: [facility];  Service: Urology;  Laterality: Right;  . MOHS SURGERY    . TUBAL LIGATION  1991    Social History:  reports that she has quit smoking. She has never used smokeless tobacco. She reports current alcohol use. She reports that she does not use drugs.  Allergies: No Known Allergies  Medications Prior to Admission  Medication Sig Dispense  Refill  . ALPRAZolam (XANAX) 0.25 MG tablet Take 1 tablet (0.25 mg total) by mouth daily as needed for anxiety. 30 tablet 0  . amoxicillin-clavulanate (AUGMENTIN) 875-125 MG tablet Take 1 tablet by mouth 2 (two) times daily for 7 days. 14 tablet 0  . aspirin EC 325 MG tablet Take 325 mg by mouth daily.    . busPIRone (BUSPAR) 15 MG tablet Take 1 tablet (15 mg total) by mouth 3 (three) times daily. 60 tablet 5  . Cholecalciferol 2000 units CAPS Take 1 capsule (2,000 Units total) by mouth daily. 30 each   . Coenzyme Q10 (CO Q10) 100 MG CAPS Take 1 capsule by mouth daily.    . DULoxetine (CYMBALTA) 30 MG capsule Take 2 capsules (60 mg total) by mouth daily. 180 capsule 1  . losartan-hydrochlorothiazide (HYZAAR) 100-25 MG tablet Take 1 tablet by mouth daily. 90 tablet 2  . metoprolol tartrate (LOPRESSOR) 25 MG tablet TAKE 1 TABLET BY MOUTH 2 TIMES DAILY. (Patient taking differently: Take 25 mg by mouth 2 (two) times daily. ) 180 tablet 3  . metroNIDAZOLE (FLAGYL) 500 MG tablet Take 1 tablet (500 mg total) by mouth 3 (three) times daily. 21 tablet 0  . nitroGLYCERIN (NITROSTAT) 0.4 MG SL tablet 1 TABLET UNDER TONGUE EVERY 5 MINUTES UP TO 3 TIMES FOR CHEST PAIN THEN CALL DR IF NO RELIEF (Patient taking differently: Place 0.4 mg under the tongue every 5 (five) minutes as needed for chest pain. 1 TABLET UNDER TONGUE EVERY 5 MINUTES UP TO 3 TIMES FOR CHEST PAIN THEN CALL DR IF NO RELIEF) 25 tablet 3  . rosuvastatin (CRESTOR) 10 MG tablet Take 1 tablet (10 mg total) by mouth daily. 90 tablet 1  . terbinafine (LAMISIL) 250 MG tablet Take 1 tablet (250 mg total) by mouth daily. 30 tablet 5  . traZODone (DESYREL) 50 MG tablet Take 1-2 tablets (50-100 mg total) by mouth at bedtime as needed for sleep. 90 tablet 3  . VASCEPA 1 g CAPS TAKE 2 CAPULES BY MOUTH TWICE DAILY (Patient taking differently: Take 2 capsules by mouth 2 (two) times a day. ) 360 capsule 2  . Vitamin D, Ergocalciferol, (DRISDOL) 1.25 MG (50000  UT) CAPS capsule Take 1 capsule (50,000 Units total) by mouth every 7 (seven) days. (Patient taking differently: Take 50,000 Units by mouth every Sunday. ) 12 capsule 3  . econazole nitrate 1 % cream Apply topically 2 (two) times daily. 30 g 0    Prior to Admission medications   Medication Sig Start Date End Date Taking? Authorizing Provider  ALPRAZolam (XANAX) 0.25 MG tablet Take 1 tablet (0.25 mg total) by mouth daily as needed for anxiety. 08/07/18  Yes Terald Sleeper, PA-C  amoxicillin-clavulanate (AUGMENTIN) 875-125 MG tablet Take 1 tablet by mouth 2 (two) times daily for 7 days. 01/25/19 02/01/19 Yes Gottschalk, Leatrice Jewels M, DO  aspirin EC 325 MG tablet  Take 325 mg by mouth daily.   Yes [provider]  busPIRone (BUSPAR) 15 MG tablet Take 1 tablet (15 mg total) by mouth 3 (three) times daily. 08/07/18  Yes Terald Sleeper, PA-C  Cholecalciferol 2000 units CAPS Take 1 capsule (2,000 Units total) by mouth daily. 08/17/16  Yes Beasley, Caren D, MD  Coenzyme Q10 (CO Q10) 100 MG CAPS Take 1 capsule by mouth daily.   Yes [provider]  DULoxetine (CYMBALTA) 30 MG capsule Take 2 capsules (60 mg total) by mouth daily. 01/01/19  Yes Terald Sleeper, PA-C  losartan-hydrochlorothiazide (HYZAAR) 100-25 MG tablet Take 1 tablet by mouth daily. 07/24/18  Yes Terald Sleeper, PA-C  metoprolol tartrate (LOPRESSOR) 25 MG tablet TAKE 1 TABLET BY MOUTH 2 TIMES DAILY. Patient taking differently: Take 25 mg by mouth 2 (two) times daily.  10/24/18  Yes Terald Sleeper, PA-C  metroNIDAZOLE (FLAGYL) 500 MG tablet Take 1 tablet (500 mg total) by mouth 3 (three) times daily. 01/25/19  Yes Gottschalk, Ashly M, DO  nitroGLYCERIN (NITROSTAT) 0.4 MG SL tablet 1 TABLET UNDER TONGUE EVERY 5 MINUTES UP TO 3 TIMES FOR CHEST PAIN THEN CALL DR IF NO RELIEF Patient taking differently: Place 0.4 mg under the tongue every 5 (five) minutes as needed for chest pain. 1 TABLET UNDER TONGUE EVERY 5 MINUTES UP TO 3 TIMES FOR CHEST PAIN  THEN CALL DR IF NO RELIEF 02/23/18  Yes Terald Sleeper, PA-C  rosuvastatin (CRESTOR) 10 MG tablet Take 1 tablet (10 mg total) by mouth daily. 07/03/18  Yes Terald Sleeper, PA-C  terbinafine (LAMISIL) 250 MG tablet Take 1 tablet (250 mg total) by mouth daily. 12/19/18  Yes Terald Sleeper, PA-C  traZODone (DESYREL) 50 MG tablet Take 1-2 tablets (50-100 mg total) by mouth at bedtime as needed for sleep. 05/03/18  Yes Jones, Angel S, PA-C  VASCEPA 1 g CAPS TAKE 2 CAPULES BY MOUTH TWICE DAILY Patient taking differently: Take 2 capsules by mouth 2 (two) times a day.  07/11/18  Yes Terald Sleeper, PA-C  Vitamin D, Ergocalciferol, (DRISDOL) 1.25 MG (50000 UT) CAPS capsule Take 1 capsule (50,000 Units total) by mouth every 7 (seven) days. Patient taking differently: Take 50,000 Units by mouth every Sunday.  08/30/18  Yes Terald Sleeper, PA-C  econazole nitrate 1 % cream Apply topically 2 (two) times daily. 12/19/18   Terald Sleeper, PA-C    Blood pressure 133/78, pulse 90, temperature 98 F (36.7 C), temperature source Oral, resp. rate 20, height 5\' 3"  (1.6 m), weight 99.3 kg, last menstrual period 12/31/2013, SpO2 93 %. Physical Exam: General: pleasant, WD/WN white female who is laying in bed in NAD HEENT: head is normocephalic, atraumatic.  Sclera are noninjected.  Pupils equal and round.  Ears and nose without any masses or lesions.  Mouth is dry. Dentition fair Heart: regular, rate, and rhythm.  No obvious murmurs, gallops, or rubs noted.  Palpable pedal pulses bilaterally Lungs: CTAB, no wheezes, rhonchi, or rales noted.  Respiratory effort nonlabored Abd: obese, soft, distended, few BS heard, diffuse TTP mostly in the lower quadrants with voluntary guarding, no masses, hernias, or organomegaly MS: all 4 extremities are symmetrical with no cyanosis, clubbing, or edema. Skin: warm and dry with no masses, lesions, or rashes Psych: A&Ox3 with an appropriate affect. Neuro: cranial nerves grossly intact,  extremity CSM intact bilaterally, normal speech  Results for orders placed or performed during the hospital encounter of 01/26/19 (from the  past 48 hour(s))  CBC with Differential/Platelet     Status: Abnormal   Collection Time: 01/28/19  5:42 AM  Result Value Ref Range   WBC 17.0 (H) 4.0 - 10.5 K/uL   RBC 4.59 3.87 - 5.11 MIL/uL   Hemoglobin 12.7 12.0 - 15.0 g/dL   HCT 40.7 36.0 - 46.0 %   MCV 88.7 80.0 - 100.0 fL   MCH 27.7 26.0 - 34.0 pg   MCHC 31.2 30.0 - 36.0 g/dL   RDW 13.6 11.5 - 15.5 %   Platelets 249 150 - 400 K/uL   nRBC 0.0 0.0 - 0.2 %   Neutrophils Relative % 86 %   Neutro Abs 14.6 (H) 1.7 - 7.7 K/uL   Lymphocytes Relative 9 %   Lymphs Abs 1.6 0.7 - 4.0 K/uL   Monocytes Relative 3 %   Monocytes Absolute 0.5 0.1 - 1.0 K/uL   Eosinophils Relative 1 %   Eosinophils Absolute 0.2 0.0 - 0.5 K/uL   Basophils Relative 0 %   Basophils Absolute 0.1 0.0 - 0.1 K/uL   Immature Granulocytes 1 %   Abs Immature Granulocytes 0.12 (H) 0.00 - 0.07 K/uL    Comment: Performed at [facility], [address], [place], [place] [address]  Comprehensive metabolic panel     Status: Abnormal   Collection Time: 01/28/19  5:42 AM  Result Value Ref Range   Sodium 134 (L) 135 - 145 mmol/L   Potassium 3.1 (L) 3.5 - 5.1 mmol/L   Chloride 100 98 - 111 mmol/L   CO2 26 22 - 32 mmol/L   Glucose, Bld 95 70 - 99 mg/dL   BUN 10 6 - 20 mg/dL   Creatinine, Ser 0.65 0.44 - 1.00 mg/dL   Calcium 7.7 (L) 8.9 - 10.3 mg/dL   Total Protein 6.3 (L) 6.5 - 8.1 g/dL   Albumin 3.0 (L) 3.5 - 5.0 g/dL   AST 12 (L) 15 - 41 U/L   ALT 11 0 - 44 U/L   Alkaline Phosphatase 63 38 - 126 U/L   Total Bilirubin 0.4 0.3 - 1.2 mg/dL   GFR calc non Af Amer >60 >60 mL/min   GFR calc Af Amer >60 >60 mL/min   Anion gap 8 5 - 15    Comment: Performed at [facility], [address] [place], [place] [address]  Magnesium     Status: None   Collection Time: 01/28/19  5:42 AM  Result Value Ref Range   Magnesium 2.1 1.7 - 2.4  mg/dL    Comment: Performed at [facility], [address], [place], [place] [address]  Protime-INR     Status: Abnormal   Collection Time: 01/28/19  5:42 AM  Result Value Ref Range   Prothrombin Time 15.9 (H) 11.4 - 15.2 seconds   INR 1.3 (H) 0.8 - 1.2    Comment: (NOTE) INR goal varies based on device and disease states. Performed at [facility], [address], [place], [place] [address]   CBC with Differential/Platelet     Status: Abnormal   Collection Time: 01/29/19  5:57 AM  Result Value Ref Range   WBC 19.1 (H) 4.0 - 10.5 K/uL   RBC 4.00 3.87 - 5.11 MIL/uL   Hemoglobin 11.4 (L) 12.0 - 15.0 g/dL   HCT 34.3 (L) 36.0 - 46.0 %   MCV 85.8 80.0 - 100.0 fL   MCH 28.5 26.0 - 34.0 pg   MCHC 33.2 30.0 - 36.0 g/dL   RDW 13.5  11.5 - 15.5 %   Platelets 240 150 - 400 K/uL   nRBC 0.0 0.0 - 0.2 %   Neutrophils Relative % 87 %   Neutro Abs 16.7 (H) 1.7 - 7.7 K/uL   Lymphocytes Relative 7 %   Lymphs Abs 1.3 0.7 - 4.0 K/uL   Monocytes Relative 4 %   Monocytes Absolute 0.8 0.1 - 1.0 K/uL   Eosinophils Relative 1 %   Eosinophils Absolute 0.1 0.0 - 0.5 K/uL   Basophils Relative 0 %   Basophils Absolute 0.1 0.0 - 0.1 K/uL   Immature Granulocytes 1 %   Abs Immature Granulocytes 0.10 (H) 0.00 - 0.07 K/uL    Comment: Performed at [facility] [address], [place], [place] [address]  Comprehensive metabolic panel     Status: Abnormal   Collection Time: 01/29/19  5:57 AM  Result Value Ref Range   Sodium 132 (L) 135 - 145 mmol/L   Potassium 4.3 3.5 - 5.1 mmol/L   Chloride 99 98 - 111 mmol/L   CO2 26 22 - 32 mmol/L   Glucose, Bld 110 (H) 70 - 99 mg/dL   BUN 6 6 - 20 mg/dL   Creatinine, Ser 0.70 0.44 - 1.00 mg/dL   Calcium 7.6 (L) 8.9 - 10.3 mg/dL   Total Protein 5.5 (L) 6.5 - 8.1 g/dL   Albumin 2.2 (L) 3.5 - 5.0 g/dL   AST 10 (L) 15 - 41 U/L   ALT 9 0 - 44 U/L   Alkaline Phosphatase 69 38 - 126 U/L   Total Bilirubin 0.2 (L) 0.3 - 1.2 mg/dL   GFR calc non Af Amer >60 >60  mL/min   GFR calc Af Amer >60 >60 mL/min   Anion gap 7 5 - 15    Comment: Performed at [facility], [place] [address], [place], [place] [address]  Magnesium     Status: None   Collection Time: 01/29/19  5:57 AM  Result Value Ref Range   Magnesium 1.9 1.7 - 2.4 mg/dL    Comment: Performed at [facility] [address], [place], [place] [address]   Ct Image Guided Drainage By Percutaneous Catheter  Result Date: 01/29/2019 INDICATION: 58 year old female with acute sigmoid colonic diverticulitis complicated by intramural abscess formation. She presents for CT-guided drain placement. EXAM: CT-guided drain placement MEDICATIONS: The patient is currently admitted to the hospital and receiving intravenous antibiotics. The antibiotics were administered within an appropriate time frame prior to the initiation of the procedure. ANESTHESIA/SEDATION: Fentanyl 100 mcg IV; Versed 2 mg IV Moderate Sedation Time:  25 minutes The patient was continuously monitored during the procedure by the interventional radiology nurse under my direct supervision. COMPLICATIONS: None immediate. PROCEDURE: Informed written consent was obtained from the patient after a thorough discussion of the procedural risks, benefits and alternatives. All questions were addressed. Maximal Sterile Barrier Technique was utilized including caps, mask, sterile gowns, sterile gloves, sterile drape, hand hygiene and skin antiseptic. A timeout was performed prior to the initiation of the procedure. A planning axial CT scan was performed. The fluid and gas collection along the mesenteric border of the sigmoid colon was successfully identified. A suitable skin entry site was selected and marked. The overlying skin was sterilely prepped and draped in the standard fashion using chlorhexidine skin prep. Local anesthesia was attained by infiltration with 1% lidocaine. A small dermatotomy was made. Under intermittent CT guidance, an 18 gauge trocar  needle was carefully advanced into  the fluid and gas collection. An Amplatz wire was then coiled in the collection. The skin tract was dilated to 12 [place]. A Cook 12 [place] all-purpose drainage catheter was then advanced over the wire and formed. Aspiration yields approximately 20 mL thick purulent fluid. A sample was sent for Gram stain and culture. The drain was then secured to the skin with 0 Prolene suture and sterile bandages. IMPRESSION: Successful placement of a 12 French drainage catheter into the intramural abscess along the mesenteric border of the sigmoid wall. Aspiration yields approximately 20 mL purulent fluid. PLAN: 1. Maintain drain to JP bulb suction. 2. Flush at least once per shift. 3. Follow-up in drain clinic in 2 weeks with repeat CT scan of the pelvis with intravenous contrast and drain injection. Signed, Criselda Peaches, MD, [facility] Radiology Electronically Signed   By: Jacqulynn Cadet M.D.   On: 01/29/2019 15:10   Anti-infectives (From admission, onward)   Start     Dose/Rate Route Frequency Ordered Stop   01/27/19 1015  ciprofloxacin (CIPRO) IVPB 400 mg     400 mg 200 mL/hr over 60 Minutes Intravenous Every 12 hours 01/27/19 0117     01/27/19 0700  metroNIDAZOLE (FLAGYL) IVPB 500 mg     500 mg 100 mL/hr over 60 Minutes Intravenous Every 8 hours 01/27/19 0117     01/26/19 2245  ciprofloxacin (CIPRO) IVPB 400 mg     400 mg 200 mL/hr over 60 Minutes Intravenous  Once 01/26/19 2232 01/27/19 0026   01/26/19 2245  metroNIDAZOLE (FLAGYL) IVPB 500 mg     500 mg 100 mL/hr over 60 Minutes Intravenous  Once 01/26/19 2232 01/27/19 0010        Assessment/Plan HTN HLD H/o MI s/p stent placement x2 2010 on aspirin 325mg  Kidney stones Tobacco abuse  Sigmoid diverticulitis with 3.8 cm abscess - 4-5 prior episodes of diverticulitis, none that required hospitalization  - Last colonoscopy 2013 by Dr. Hilarie Fredrickson showed  mild diverticulosis in the left colon - Patient now with complicated diverticulitis. She underwent successful drain placement today for the abscess. Hopefully she will continue to improve and can plan for elective one-stage surgery later on once her acute flare up has resolved. If she does not improve and requires surgery this admission it will likely be colectomy/colostomy. Continue drain, IV antibiotics, and bowel rest. Will continue to follow. If Ms. Iovine does avoid a surgery this admission, she specifically named Dr. Marcello Moores as who she would like to follow up with to discuss surgery.  ID - cipro/flagyl 6/26>> VTE - SCDs, sq heparin FEN - IVF, NPO Foley - none Follow up - TBD  Wellington Hampshire, [facility] Surgery 01/29/2019, 3:47 PM Pager: [phone] Mon-Thurs 7:00 am-4:30 pm Fri 7:00 am -11:30 AM Sat-Sun 7:00 am-11:30 am

## 2019-01-29 NOTE — Progress Notes (Signed)
Daughter Carren Blakley [phone]

## 2019-01-29 NOTE — Progress Notes (Signed)
Pt states that she does use CPAP at home, but patient refuses to use one tonight. She states that "she wishes not to use it" No distress or complications noted.

## 2019-01-29 NOTE — Progress Notes (Signed)
Pt back from her IR procedure.  JP drain intact on the left lower quadrant covered with white ABD and surgical tape.  Dressing is clean and dry and intact.  Pt states pain 2/10, pt was repositioned in the bed and made comfortably as possible.

## 2019-01-29 NOTE — Progress Notes (Signed)
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  Patient says that she was seen by her PCP yesterday for ongoing diarrhea and abdominal pain for past 5 days.  Her PCP diagnosed clinically as diverticulitis and started on Augmentin and Flagyl.  Patient says that she did not feel better and in fact her bloating got worse with significant worsening of abdominal pain.  Patient came to the hospital for further evaluation.  CT scan of the abdomen pelvis showed acute complicated sigmoid diverticulitis.  There is 3.8 cm abscess adjacent to sigmoid colon.  Small to moderate volume of fluid within the pelvis with questionable rim enhancement.  General surgery was consulted by ED physician.  General surgery recommended IV antibiotics and fluids.  Patient will likely need IR evaluation for drainage of abscess.  General surgery to see patient in a.m. She complains of low-grade fever Also had nonbloody diarrhea. Denies chest pain or shortness of breath. Denies dysuria   Subjective: A/O x4, negative CP, negative S OB, positive abdominal pain (appropriate for just completing procedure).  States has had several episodes of diverticulitis which have required antibiotics at home but has not been hospitalized previously for this condition.   Assessment & Plan:   Active Problems:   Acute diverticulitis   OSA on CPAP   Hyperlipidemia   Generalized anxiety disorder   Essential hypertension   Insomnia   Obesity (BMI 30-39.9)   Recurrent major depressive disorder, in partial remission ([place])   Adjustment disorder   Depression   History of MI (myocardial infarction)   Diverticulitis   Hypokalemia   Hypomagnesemia    Intra-abdominal abscess (HCC)   Left lower quadrant abdominal pain  Sigmoid diverticulitis with abscess - S/p JP drain LL quadrant abdomen drain mostly serosanguineous fluid at this point. - Flush once per shift. - Cultures pending. - Continue current antibiotics. - Have consulted [facility], discussed case with Dr. Rolm Bookbinder.  They will see patient in give recommendations on sending patient home on JP drain +ABx  vs performing surgery prior to patient departing hospital. -Maintain patient n.p.o. except for ice chips to wet lips and mouth -While patient hospitalized will conduct with stratification for future surgery.  Anxiety/depression - Xanax 0.25 mg daily as needed (hold) - Buspirone 15 mg TID (hold) -Cymbalta 60 mg daily (hold) -Trazodone 50 - 100 mg nightly as needed (hold) -Ativan 0.5-1 IV  Mg PRN  Essential HTN/CAD -S/p stent placement -Metoprolol 25 mg p.o. (hold) - Metoprolol IV 5 mg  -Echocardiogram  Hx MI  -See HTN  HLD -Lipid panel pending - A1c pending  OSA -CPAP per respiratory    DVT prophylaxis: SCD -->Lovenox on 6/30 Code Status: Full Family Communication: 6/29 daughter was on phone for discussion of plan of care while myself and PA Brooke from [facility] was present. Disposition Plan: TBD   Consultants:  General surgery Dr. Arnoldo Morale [facility]    Procedures/Significant Events:  6/26 CT abdomen pelvis W contrast: acute complicated sigmoid diverticulitis. There is a 3.8 cm abscess adjacent to the sigmoid colon.  -small to moderate volume of fluid within the pelvis with questionable rim enhancement. -Severe sigmoid diverticulosis. -Hepatic steatosis. -Nonobstructing stones in the lower pole the right kidney. 6/29  IR abscess drainage placed 12 French drain LLQ diverticular abscess aspirated 82ml thick purulent fluid    I have personally reviewed and interpreted all radiology studies and my findings are as above.  VENTILATOR SETTINGS:     Cultures 6/29 LLQ diverticular abscess pending    Antimicrobials: Anti-infectives (From admission, onward)   Start     Stop   01/27/19 1015  ciprofloxacin (CIPRO) IVPB 400 mg         01/27/19 0700  metroNIDAZOLE (FLAGYL) IVPB 500 mg         01/26/19 2245  ciprofloxacin (CIPRO) IVPB 400 mg     01/27/19 0026   01/26/19 2245  metroNIDAZOLE (FLAGYL) IVPB 500 mg     01/27/19 0010       Devices    LINES / TUBES:  12 French LLQ diverticular JP drain 6/29>>>     Continuous Infusions: . sodium chloride 100 mL/hr at 01/29/19 0520  . ciprofloxacin 400 mg (01/29/19 1453)  . metronidazole 500 mg (01/29/19 1459)     Objective: Vitals:   01/29/19 1208 01/29/19 1400 01/29/19 1433 01/29/19 1520  BP: 134/71 134/76 (!) 142/71 133/78  Pulse: 95 98 94 90  Resp: 18  20   Temp:  97.6 F (36.4 C) 98 F (36.7 C)   TempSrc:  Oral Oral   SpO2: 96% 95% 95% 93%  Weight:      Height:        Intake/Output Summary (Last 24 hours) at 01/29/2019 1635 Last data filed at 01/29/2019 1500 Gross per 24 hour  Intake 1845.67 ml  Output 66 ml  Net 1779.67 ml   Filed Weights   01/27/19 0138 01/28/19 1413  Weight: 92.3 kg 99.3 kg    Examination:  General: A/O x4 no acute respiratory distress Eyes: negative scleral hemorrhage, negative anisocoria, negative icterus ENT: Negative Runny nose, negative gingival bleeding, Neck:  Negative scars, masses, torticollis, lymphadenopathy, JVD Lungs: Clear to auscultation bilaterally without wheezes or crackles Cardiovascular: Regular rate and rhythm without murmur gallop or rub normal S1 and S2 Abdomen: Obese, positive abdominal pain, LLQ JP drain in place draining mostly serosanguineous fluid, positive distended, hypoactive  bowel sounds, no rebound, no ascites, no appreciable mass Extremities: No significant cyanosis, clubbing, or edema bilateral lower extremities Skin: Negative rashes, lesions, ulcers Psychiatric:  Negative depression, negative  anxiety, negative fatigue, negative mania  Central nervous system:  Cranial nerves II through XII intact, tongue/uvula midline, all extremities muscle strength 5/5, sensation intact throughout, negative dysarthria, negative expressive aphasia, negative receptive aphasia.  .     Data Reviewed: Care during the described time interval was provided by me .  I have reviewed this patient's available data, including medical history, events of note, physical examination, and all test results as part of my evaluation.   CBC: Recent Labs  Lab 01/25/19 0924 01/26/19 2032 01/27/19 0718 01/28/19 0542 01/29/19 0557  WBC 17.3* 11.5* 15.4* 17.0* 19.1*  NEUTROABS  --  10.2*  --  14.6* 16.7*  HGB 14.3 14.1 12.4 12.7 11.4*  HCT 42.2 41.9 37.9 40.7 34.3*  MCV 83 84.8 86.9 88.7 85.8  PLT 228 214 217 249 176   Basic Metabolic Panel: Recent Labs  Lab 01/25/19 0924 01/26/19 2032 01/27/19 0718 01/28/19 0542 01/29/19 0557  NA 140 132* 134* 134* 132*  K 3.9 2.7* 3.5 3.1* 4.3  CL 96 97* 99 100 99  CO2 26 24 26 26 26   GLUCOSE 100* 134* 109* 95 110*  BUN 8 11  11 10 6   CREATININE 0.76 0.58 0.57 0.65 0.70  CALCIUM 9.6 8.5* 7.7* 7.7* 7.6*  MG  --  1.6*  --  2.1 1.9   GFR: Estimated Creatinine Clearance: 86.2 mL/min (by C-G formula based on SCr of 0.7 mg/dL). Liver Function Tests: Recent Labs  Lab 01/25/19 0924 01/26/19 2032 01/27/19 0718 01/28/19 0542 01/29/19 0557  AST 15 14* 10* 12* 10*  ALT 13 14 12 11 9   ALKPHOS 74 69 53 63 69  BILITOT 0.4 0.6 0.6 0.4 0.2*  PROT 6.8 7.4 5.9* 6.3* 5.5*  ALBUMIN 4.0 3.5 2.8* 3.0* 2.2*   Recent Labs  Lab 01/26/19 2032  LIPASE 26   No results for input(s): AMMONIA in the last 168 hours. Coagulation Profile: Recent Labs  Lab 01/26/19 2032 01/28/19 0542  INR 1.0 1.3*   Cardiac Enzymes: No results for input(s): CKTOTAL, CKMB, CKMBINDEX, TROPONINI in the last 168 hours. BNP (last 3 results) No results for input(s): PROBNP in the last 8760 hours.  HbA1C: No results for input(s): HGBA1C in the last 72 hours. CBG: No results for input(s): GLUCAP in the last 168 hours. Lipid Profile: No results for input(s): CHOL, HDL, LDLCALC, TRIG, CHOLHDL, LDLDIRECT in the last 72 hours. Thyroid Function Tests: No results for input(s): TSH, T4TOTAL, FREET4, T3FREE, THYROIDAB in the last 72 hours. Anemia Panel: No results for input(s): VITAMINB12, FOLATE, FERRITIN, TIBC, IRON, RETICCTPCT in the last 72 hours. Urine analysis:    Component Value Date/Time   COLORURINE YELLOW 01/27/2019 0322   APPEARANCEUR CLEAR 01/27/2019 0322   APPEARANCEUR Clear 02/25/2016 0937   LABSPEC >1.046 (H) 01/27/2019 0322   PHURINE 5.0 01/27/2019 0322   GLUCOSEU NEGATIVE 01/27/2019 0322   HGBUR MODERATE (A) 01/27/2019 0322   BILIRUBINUR NEGATIVE 01/27/2019 0322   BILIRUBINUR Negative 02/25/2016 0937   KETONESUR NEGATIVE 01/27/2019 0322   PROTEINUR 100 (A) 01/27/2019 0322   UROBILINOGEN negative 03/24/2015 0937   UROBILINOGEN 0.2 05/26/2012 1230   NITRITE NEGATIVE 01/27/2019 0322   LEUKOCYTESUR TRACE (A) 01/27/2019 0322   Sepsis Labs: @LABRCNTIP (procalcitonin:4,lacticidven:4)  ) Recent Results (from the past 240 hour(s))  SARS Coronavirus 2 (CEPHEID - Performed in [facility] hospital lab), Hosp Order     Status: None   Collection Time: 01/26/19 10:40 PM   Specimen: Nasopharyngeal Swab  Result Value Ref Range Status   SARS Coronavirus 2 NEGATIVE NEGATIVE Final    Comment: (NOTE) If result is NEGATIVE SARS-CoV-2 target nucleic acids are NOT DETECTED. The SARS-CoV-2 RNA is generally detectable in upper and lower  respiratory specimens during the acute phase of infection. The lowest  concentration of SARS-CoV-2 viral copies this assay can detect is 250  copies / mL. A negative result does not preclude SARS-CoV-2 infection  and should not be used as the sole basis for treatment or other  patient management decisions.  A negative result may occur with   improper specimen collection / handling, submission of specimen other  than nasopharyngeal swab, presence of viral mutation(s) within the  areas targeted by this assay, and inadequate number of viral copies  (<250 copies / mL). A negative result must be combined with clinical  observations, patient history, and epidemiological information. If result is POSITIVE SARS-CoV-2 target nucleic acids are DETECTED. The SARS-CoV-2 RNA is generally detectable in upper and lower  respiratory specimens dur ing the acute phase of infection.  Positive  results are indicative of active infection with SARS-CoV-2.  Clinical  correlation with patient history and other diagnostic information is  necessary to determine patient infection status.  Positive results do  not rule out bacterial infection or co-infection with other viruses. If result is PRESUMPTIVE POSTIVE SARS-CoV-2 nucleic acids MAY BE PRESENT.   A presumptive positive result was obtained on the submitted specimen  and confirmed on repeat testing.  While 2019 novel coronavirus  (SARS-CoV-2) nucleic acids may be present in the submitted sample  additional confirmatory testing may be necessary for epidemiological  and / or clinical management purposes  to differentiate between  SARS-CoV-2 and other Sarbecovirus currently known to infect humans.  If clinically indicated additional testing with an alternate test  methodology [phone]) is advised. The SARS-CoV-2 RNA is generally  detectable in upper and lower respiratory sp ecimens during the acute  phase of infection. The expected result is Negative. Fact Sheet for Patients:  StrictlyIdeas.no Fact Sheet for Healthcare Providers: BankingDealers.co.za This test is not yet approved or cleared by the [place] FDA and has been authorized for detection and/or diagnosis of SARS-CoV-2 by FDA under an Emergency Use Authorization (EUA).  This EUA will  remain in effect (meaning this test can be used) for the duration of the COVID-19 declaration under Section 564(b)(1) of the Act, 21 U.S.C. section 360bbb-3(b)(1), unless the authorization is terminated or revoked sooner. Performed at [facility], [address] [place], [place] [address]          Radiology Studies: Ct Image Guided Drainage By Percutaneous Catheter  Result Date: 01/29/2019 INDICATION: 58 year old female with acute sigmoid colonic diverticulitis complicated by intramural abscess formation. She presents for CT-guided drain placement. EXAM: CT-guided drain placement MEDICATIONS: The patient is currently admitted to the hospital and receiving intravenous antibiotics. The antibiotics were administered within an appropriate time frame prior to the initiation of the procedure. ANESTHESIA/SEDATION: Fentanyl 100 mcg IV; Versed 2 mg IV Moderate Sedation Time:  25 minutes The patient was continuously monitored during the procedure by the interventional radiology nurse under my direct supervision. COMPLICATIONS: None immediate. PROCEDURE: Informed written consent was obtained from the patient after a thorough discussion of the procedural risks, benefits and alternatives. All questions were addressed. Maximal Sterile Barrier Technique was utilized including caps, mask, sterile gowns, sterile gloves, sterile drape, hand hygiene and skin antiseptic. A timeout was performed prior to the initiation of the procedure. A planning axial CT scan was performed. The fluid and gas collection along the mesenteric border of the sigmoid colon was successfully identified. A suitable skin entry site was selected and marked. The overlying skin was sterilely prepped and draped in the standard fashion using chlorhexidine skin prep. Local anesthesia was attained by infiltration with 1% lidocaine. A small dermatotomy was made. Under intermittent CT guidance, an 18 gauge trocar needle was carefully advanced into the  fluid and gas collection. An Amplatz wire was then coiled in the collection. The skin tract was dilated to 12 [place]. A Cook 12 [place] all-purpose drainage catheter was then advanced over the wire and formed. Aspiration yields approximately 20 mL thick purulent fluid. A sample was sent for Gram stain and culture. The drain was then secured to the skin with 0 Prolene suture and sterile bandages. IMPRESSION: Successful placement of a 12 French drainage catheter into the intramural abscess along the mesenteric border of the sigmoid wall. Aspiration yields approximately 20 mL purulent fluid. PLAN: 1. Maintain drain to JP bulb suction. 2. Flush at least once per shift. 3. Follow-up in drain clinic in 2 weeks with repeat CT scan of the pelvis with intravenous contrast  and drain injection. Signed, Criselda Peaches, MD, [facility] Radiology Electronically Signed   By: Jacqulynn Cadet M.D.   On: 01/29/2019 15:10        Scheduled Meds: . [START ON 01/30/2019] aspirin EC  325 mg Oral Daily  . fentaNYL      . guaiFENesin  600 mg Oral BID  . lidocaine      . metoprolol tartrate  5 mg Intravenous Q6H  . midazolam      . rosuvastatin  10 mg Oral Daily  . sodium chloride flush  5 mL Intracatheter Q8H   Continuous Infusions: . sodium chloride 100 mL/hr at 01/29/19 0520  . ciprofloxacin 400 mg (01/29/19 1453)  . metronidazole 500 mg (01/29/19 1459)     LOS: 2 days   The patient is critically ill with multiple organ systems failure and requires high complexity decision making for assessment and support, frequent evaluation and titration of therapies, application of advanced monitoring technologies and extensive interpretation of multiple databases. Critical Care Time devoted to patient care services described in this note  Time spent: 40 minutes     Linard Daft, Geraldo Docker, MD [facility] Pager [phone]  If 7PM-7AM, please contact  night-coverage [email or web address] Password [facility] 01/29/2019, 4:35 PM

## 2019-01-29 NOTE — Progress Notes (Signed)
Pt resting at the moment.  Dr Sherral Hammers in the room with the pt.  SCD applied to the BLLE.  JP drain intact, dsg clean/dry and intact

## 2019-01-29 NOTE — Consult Note (Signed)
Chief Complaint: Patient was seen in consultation today for intra abdominal abscess drain placement Chief Complaint  Patient presents with   Abdominal Pain   at the request of Dr Mickeal Needy  Referring Physician(s): Dr Murvin Natal  Supervising Physician: Jacqulynn Cadet  Patient Status: [facility] - In-pt  History of Present Illness: Heather Lam is a 58 y.o. female   Transferred from [place] yesterday Worsening LLQ abd pain 4th episode of diverticulitis in last 5 yrs per pt.  CT 6/26: IMPRESSION: 1. Findings consistent with acute complicated sigmoid diverticulitis. There is a 3.8 cm abscess adjacent to the sigmoid colon. There is a small to moderate volume of fluid within the pelvis with questionable rim enhancement. 2. Severe sigmoid diverticulosis. 3. Hepatic steatosis. 4. Nonobstructing stones in the lower pole the right kidney. 5.  Aortic Atherosclerosis (ICD10-I70.0).  Request made for abscess rain placement Imaging reviewed with Dr Earleen Newport--- and approves procedure   Past Medical History:  Diagnosis Date   Anxiety    Back pain    CAD (coronary artery disease)    Chest pain    Complication of anesthesia    Constipation    Diverticulitis    Diverticulosis    Heart attack ([place]) 2010   History of kidney stones    Hyperlipemia    Hypertension    Joint pain    PONV (postoperative nausea and vomiting)    Skin cancer    on nose   Sleep apnea    wears CPAP nightly   Vitamin D deficiency     Past Surgical History:  Procedure Laterality Date   APPENDECTOMY     BACK SURGERY     CARPAL TUNNEL RELEASE Right    CARPAL TUNNEL RELEASE Left 08/13/2016   Procedure: LEFT CARPAL TUNNEL RELEASE;  Surgeon: Roseanne Kaufman, MD;  Location: [facility];  Service: Orthopedics;  Laterality: Left;   CORONARY STENT PLACEMENT     2 stents in the same artery   ENDOMETRIAL ABLATION  2005   EXTRACORPOREAL SHOCK WAVE LITHOTRIPSY Right  06/27/2017   Procedure: RIGHT EXTRACORPOREAL SHOCK WAVE LITHOTRIPSY (ESWL);  Surgeon: Franchot Gallo, MD;  Location: [facility];  Service: Urology;  Laterality: Right;   MOHS SURGERY     TUBAL LIGATION  1991    Allergies: Patient has no known allergies.  Medications: Prior to Admission medications   Medication Sig Start Date End Date Taking? Authorizing Provider  ALPRAZolam (XANAX) 0.25 MG tablet Take 1 tablet (0.25 mg total) by mouth daily as needed for anxiety. 08/07/18  Yes Terald Sleeper, PA-C  amoxicillin-clavulanate (AUGMENTIN) 875-125 MG tablet Take 1 tablet by mouth 2 (two) times daily for 7 days. 01/25/19 02/01/19 Yes Gottschalk, Leatrice Jewels M, DO  aspirin EC 325 MG tablet Take 325 mg by mouth daily.   Yes [provider]  busPIRone (BUSPAR) 15 MG tablet Take 1 tablet (15 mg total) by mouth 3 (three) times daily. 08/07/18  Yes Terald Sleeper, PA-C  Cholecalciferol 2000 units CAPS Take 1 capsule (2,000 Units total) by mouth daily. 08/17/16  Yes Beasley, Caren D, MD  Coenzyme Q10 (CO Q10) 100 MG CAPS Take 1 capsule by mouth daily.   Yes [provider]  DULoxetine (CYMBALTA) 30 MG capsule Take 2 capsules (60 mg total) by mouth daily. 01/01/19  Yes Terald Sleeper, PA-C  losartan-hydrochlorothiazide (HYZAAR) 100-25 MG tablet Take 1 tablet by mouth daily. 07/24/18  Yes Terald Sleeper, PA-C  metoprolol tartrate (LOPRESSOR) 25 MG tablet  TAKE 1 TABLET BY MOUTH 2 TIMES DAILY. Patient taking differently: Take 25 mg by mouth 2 (two) times daily.  10/24/18  Yes Terald Sleeper, PA-C  metroNIDAZOLE (FLAGYL) 500 MG tablet Take 1 tablet (500 mg total) by mouth 3 (three) times daily. 01/25/19  Yes Gottschalk, Ashly M, DO  nitroGLYCERIN (NITROSTAT) 0.4 MG SL tablet 1 TABLET UNDER TONGUE EVERY 5 MINUTES UP TO 3 TIMES FOR CHEST PAIN THEN CALL DR IF NO RELIEF Patient taking differently: Place 0.4 mg under the tongue every 5 (five) minutes as needed for chest pain. 1 TABLET UNDER TONGUE EVERY 5 MINUTES  UP TO 3 TIMES FOR CHEST PAIN THEN CALL DR IF NO RELIEF 02/23/18  Yes Terald Sleeper, PA-C  rosuvastatin (CRESTOR) 10 MG tablet Take 1 tablet (10 mg total) by mouth daily. 07/03/18  Yes Terald Sleeper, PA-C  terbinafine (LAMISIL) 250 MG tablet Take 1 tablet (250 mg total) by mouth daily. 12/19/18  Yes Terald Sleeper, PA-C  traZODone (DESYREL) 50 MG tablet Take 1-2 tablets (50-100 mg total) by mouth at bedtime as needed for sleep. 05/03/18  Yes Jones, Angel S, PA-C  VASCEPA 1 g CAPS TAKE 2 CAPULES BY MOUTH TWICE DAILY Patient taking differently: Take 2 capsules by mouth 2 (two) times a day.  07/11/18  Yes Terald Sleeper, PA-C  Vitamin D, Ergocalciferol, (DRISDOL) 1.25 MG (50000 UT) CAPS capsule Take 1 capsule (50,000 Units total) by mouth every 7 (seven) days. Patient taking differently: Take 50,000 Units by mouth every Sunday.  08/30/18  Yes Terald Sleeper, PA-C  econazole nitrate 1 % cream Apply topically 2 (two) times daily. 12/19/18   Terald Sleeper, PA-C     Family History  Problem Relation Age of Onset   Hyperlipidemia Mother    Hypertension Mother    Cancer Father        skin   Hyperlipidemia Father    Dementia Father    Arthritis Father    Hypertension Father    Sleep apnea Father    Other Sister        Myelofibrosis   Arthritis Sister    Hyperlipidemia Sister    Arthritis Sister    Hyperlipidemia Sister    Diabetes Paternal Grandfather    Colon cancer Neg Hx     Social History   Socioeconomic History   Marital status: Married    Spouse name: Not on file   Number of children: Not on file   Years of education: Not on file   Highest education level: Not on file  Occupational History   Occupation: Optician, dispensing: [employer]   Financial resource strain: Not on file   Food insecurity    Worry: Not on file    Inability: Not on file   Transportation needs    Medical: Not on file    Non-medical: Not on file  Tobacco Use   Smoking  status: Former Smoker   Smokeless tobacco: Never Used  Substance and Sexual Activity   Alcohol use: Yes    Comment: Rare    Drug use: No   Sexual activity: Yes  Lifestyle   Physical activity    Days per week: Not on file    Minutes per session: Not on file   Stress: Not on file  Relationships   Social connections    Talks on phone: Not on file    Gets together: Not on file    Attends  religious service: Not on file    Active member of club or organization: Not on file    Attends meetings of clubs or organizations: Not on file    Relationship status: Not on file  Other Topics Concern   Not on file  Social History Narrative   Caffeine daily     Review of Systems: A 12 point ROS discussed and pertinent positives are indicated in the HPI above.  All other systems are negative.  Review of Systems  Constitutional: Positive for activity change, appetite change and fatigue. Negative for fever.  Respiratory: Negative for cough and shortness of breath.   Cardiovascular: Negative for chest pain.  Gastrointestinal: Positive for abdominal distention, abdominal pain, diarrhea and nausea.  Musculoskeletal: Negative for gait problem.  Psychiatric/Behavioral: Negative for behavioral problems and confusion.    Vital Signs: BP 127/72 (BP Location: Left Arm)    Pulse 86    Temp 99.5 F (37.5 C) (Oral)    Resp 17    Ht 5\' 3"  (1.6 m)    Wt 218 lb 14.7 oz (99.3 kg)    LMP 12/31/2013    SpO2 92%    BMI 38.78 kg/m   Physical Exam Vitals signs reviewed.  Cardiovascular:     Rate and Rhythm: Normal rate and regular rhythm.  Pulmonary:     Breath sounds: Normal breath sounds.  Abdominal:     Palpations: Abdomen is soft.     Tenderness: There is abdominal tenderness in the left lower quadrant.  Skin:    General: Skin is warm and dry.  Neurological:     Mental Status: She is alert and oriented to person, place, and time.  Psychiatric:        Behavior: Behavior normal.      Imaging: Ct Abdomen Pelvis W Contrast  Result Date: 01/26/2019 CLINICAL DATA:  Abdominal pain with diverticulitis suspected. EXAM: CT ABDOMEN AND PELVIS WITH CONTRAST TECHNIQUE: Multidetector CT imaging of the abdomen and pelvis was performed using the standard protocol following bolus administration of intravenous contrast. CONTRAST:  121mL OMNIPAQUE IOHEXOL 300 MG/ML  SOLN COMPARISON:  CT of the pelvis dated 11/19/2016 FINDINGS: Lower chest: The heart size is mildly enlarged. Hepatobiliary: No focal liver abnormality is seen. No gallstones, gallbladder wall thickening, or biliary dilatation. There is likely underlying hepatic steatosis. Pancreas: Unremarkable. No pancreatic ductal dilatation or surrounding inflammatory changes. Spleen: Normal in size without focal abnormality. Adrenals/Urinary Tract: There is a stable peripherally calcified cyst involving the left kidney. There is no hydronephrosis. The adrenal glands are unremarkable. There is a relatively stable hyperdense cyst involving the anterior interpolar region of the right kidney. There are punctate nonobstructing stones in the lower pole of the right kidney. The bladder is unremarkable. Stomach/Bowel: There are findings of sigmoid diverticulitis complicated by a 3.8 x 3.3 cm abscess (axial series 2, image 71). There is some reactive free fluid in the pelvis with questionable rim enhancement. There is severe sigmoid diverticulosis. The appendix is not reliably identified. There is a stable fat containing intraluminal lesion within the proximal jejunum. The stomach is unremarkable. Vascular/Lymphatic: Aortic atherosclerosis. No enlarged abdominal or pelvic lymph nodes. Reproductive: There is a partially calcified fibroid within the uterus. There is no evidence for an ovarian mass, however evaluation is somewhat limited by free fluid in the patient's pelvis. Other: There is no abdominal wall hernia. Musculoskeletal: No acute or significant  osseous findings. IMPRESSION: 1. Findings consistent with acute complicated sigmoid diverticulitis. There is a 3.8 cm abscess  adjacent to the sigmoid colon. There is a small to moderate volume of fluid within the pelvis with questionable rim enhancement. 2. Severe sigmoid diverticulosis. 3. Hepatic steatosis. 4. Nonobstructing stones in the lower pole the right kidney. 5.  Aortic Atherosclerosis (ICD10-I70.0). Electronically Signed   By: Constance Holster M.D.   On: 01/26/2019 22:25    Labs:  CBC: Recent Labs    01/26/19 2032 01/27/19 0718 01/28/19 0542 01/29/19 0557  WBC 11.5* 15.4* 17.0* 19.1*  HGB 14.1 12.4 12.7 11.4*  HCT 41.9 37.9 40.7 34.3*  PLT 214 217 249 240    COAGS: Recent Labs    01/26/19 2032 01/28/19 0542  INR 1.0 1.3*    BMP: Recent Labs    01/26/19 2032 01/27/19 0718 01/28/19 0542 01/29/19 0557  NA 132* 134* 134* 132*  K 2.7* 3.5 3.1* 4.3  CL 97* 99 100 99  CO2 24 26 26 26   GLUCOSE 134* 109* 95 110*  BUN 11 11 10 6   CALCIUM 8.5* 7.7* 7.7* 7.6*  CREATININE 0.58 0.57 0.65 0.70  GFRNONAA >60 >60 >60 >60  GFRAA >60 >60 >60 >60    LIVER FUNCTION TESTS: Recent Labs    01/26/19 2032 01/27/19 0718 01/28/19 0542 01/29/19 0557  BILITOT 0.6 0.6 0.4 0.2*  AST 14* 10* 12* 10*  ALT 14 12 11 9   ALKPHOS 69 53 63 69  PROT 7.4 5.9* 6.3* 5.5*  ALBUMIN 3.5 2.8* 3.0* 2.2*    TUMOR MARKERS: No results for input(s): AFPTM, CEA, CA199, CHROMGRNA in the last 8760 hours.  Assessment and Plan:  Sigmoid diverticular abscess Now scheduled for abscess drain placement Risks and benefits discussed with the patient including bleeding, infection, damage to adjacent structures, bowel perforation/fistula connection, and sepsis.  All of the patient's questions were answered, patient is agreeable to proceed. Consent signed and in chart.   Thank you for this interesting consult.  I greatly enjoyed meeting Heather Lam and look forward to participating in their  care.  A copy of this report was sent to the requesting provider on this date.  Electronically Signed: Lavonia Drafts, PA-C 01/29/2019, 8:03 AM   I spent a total of 40 Minutes    in face to face in clinical consultation, greater than 50% of which was counseling/coordinating care for intra abdominal abscess drain

## 2019-01-29 NOTE — Progress Notes (Signed)
Chaplain rec'd request from patient's friend to check on her.  Patient welcomed short visit from chaplain, appreciating her friend's thoughtfulness. Patient just had procedure hours ago and was in pain, so chaplain made visit short. Providing words of support and a blessing. Rev. Tamsen Snider Pager [phone]

## 2019-01-29 NOTE — Plan of Care (Signed)
  Problem: Education: Goal: Knowledge of General Education information will improve Description Including pain rating scale, medication(s)/side effects and non-pharmacologic comfort measures Outcome: Progressing   

## 2019-01-29 NOTE — Progress Notes (Signed)
Pt left for IR procedure.  texted her daughter to make her aware that her mom has left the floor for her procedure

## 2019-01-30 ENCOUNTER — Inpatient Hospital Stay (HOSPITAL_COMMUNITY): Payer: No Typology Code available for payment source

## 2019-01-30 DIAGNOSIS — R7303 Prediabetes: Secondary | ICD-10-CM

## 2019-01-30 DIAGNOSIS — Z01810 Encounter for preprocedural cardiovascular examination: Secondary | ICD-10-CM

## 2019-01-30 LAB — CBC WITH DIFFERENTIAL/PLATELET
Abs Immature Granulocytes: 0.15 10*3/uL — ABNORMAL HIGH (ref 0.00–0.07)
Basophils Absolute: 0.1 10*3/uL (ref 0.0–0.1)
Basophils Relative: 0 %
Eosinophils Absolute: 0.2 10*3/uL (ref 0.0–0.5)
Eosinophils Relative: 1 %
HCT: 35.6 % — ABNORMAL LOW (ref 36.0–46.0)
Hemoglobin: 11.5 g/dL — ABNORMAL LOW (ref 12.0–15.0)
Immature Granulocytes: 1 %
Lymphocytes Relative: 9 %
Lymphs Abs: 1.5 10*3/uL (ref 0.7–4.0)
MCH: 27.9 pg (ref 26.0–34.0)
MCHC: 32.3 g/dL (ref 30.0–36.0)
MCV: 86.4 fL (ref 80.0–100.0)
Monocytes Absolute: 0.9 10*3/uL (ref 0.1–1.0)
Monocytes Relative: 5 %
Neutro Abs: 14.2 10*3/uL — ABNORMAL HIGH (ref 1.7–7.7)
Neutrophils Relative %: 84 %
Platelets: 271 10*3/uL (ref 150–400)
RBC: 4.12 MIL/uL (ref 3.87–5.11)
RDW: 13.5 % (ref 11.5–15.5)
WBC: 16.9 10*3/uL — ABNORMAL HIGH (ref 4.0–10.5)
nRBC: 0 % (ref 0.0–0.2)

## 2019-01-30 LAB — COMPREHENSIVE METABOLIC PANEL WITH GFR
ALT: 8 U/L (ref 0–44)
AST: 9 U/L — ABNORMAL LOW (ref 15–41)
Albumin: 2.1 g/dL — ABNORMAL LOW (ref 3.5–5.0)
Alkaline Phosphatase: 66 U/L (ref 38–126)
Anion gap: 7 (ref 5–15)
BUN: 6 mg/dL (ref 6–20)
CO2: 27 mmol/L (ref 22–32)
Calcium: 7.8 mg/dL — ABNORMAL LOW (ref 8.9–10.3)
Chloride: 101 mmol/L (ref 98–111)
Creatinine, Ser: 0.6 mg/dL (ref 0.44–1.00)
GFR calc Af Amer: >60 mL/min
GFR calc non Af Amer: >60 mL/min
Glucose, Bld: 113 mg/dL — ABNORMAL HIGH (ref 70–99)
Potassium: 4.4 mmol/L (ref 3.5–5.1)
Sodium: 135 mmol/L (ref 135–145)
Total Bilirubin: 0.6 mg/dL (ref 0.3–1.2)
Total Protein: 5.2 g/dL — ABNORMAL LOW (ref 6.5–8.1)

## 2019-01-30 LAB — HEMOGLOBIN A1C
Hgb A1c MFr Bld: 5.8 % — ABNORMAL HIGH (ref 4.8–5.6)
Mean Plasma Glucose: 119.76 mg/dL

## 2019-01-30 LAB — LIPID PANEL
Cholesterol: 57 mg/dL (ref 0–200)
HDL: 13 mg/dL — ABNORMAL LOW (ref >40)
LDL Cholesterol: 25 mg/dL (ref 0–99)
Total CHOL/HDL Ratio: 4.4 RATIO
Triglycerides: 95 mg/dL (ref <150)
VLDL: 19 mg/dL (ref 0–40)

## 2019-01-30 LAB — MAGNESIUM: Magnesium: 2 mg/dL (ref 1.7–2.4)

## 2019-01-30 LAB — ECHOCARDIOGRAM COMPLETE
Height: 63 in
Weight: 3502.67 oz

## 2019-01-30 MED ORDER — PIPERACILLIN-TAZOBACTAM 3.375 G IVPB
3.3750 g | Freq: Three times a day (TID) | INTRAVENOUS | Status: AC
  Administered 2019-01-30 – 2019-02-14 (×45): 3.375 g via INTRAVENOUS
  Filled 2019-01-30 (×37): qty 50

## 2019-01-30 NOTE — Progress Notes (Signed)
Daughter, Joellen Jersey, is going to call to schedule apt with Dr Marcello Moores around her work schedule.

## 2019-01-30 NOTE — Progress Notes (Signed)
[facility]/Trauma Progress Note      Assessment/Plan HTN HLD H/o MI s/p stent placement x2 2010 on aspirin 325mg  Kidney stones Tobacco abuse  Sigmoid diverticulitis with 3.8 cm abscess - 4-5 prior episodes of diverticulitis, none that required hospitalization  - Last colonoscopy 2013 by Dr. Hilarie Fredrickson showed mild diverticulosis in the left colon - S/P drain placement 06/29 for the abscess.  - Hopefully she will continue to improve and can plan for elective one-stage surgery later on once her acute flare up has resolved. If she does not improve and requires surgery this admission it will likely be colectomy/colostomy.  - Continue drain, IV antibiotics, and bowel rest. Will continue to follow.  ID - cipro/flagyl 6/26>> VTE - SCDs, sq heparin FEN - IVF, NPO, sips and chips Foley - none Follow up - Dr. Leighton Ruff   LOS: 3 days    Subjective: CC: abdominal pain  Pt states pain might be slightly better but she is still requiring the pain medicine. No issues overnight. No nausea, vomiting, fever or chills. No BM. + flatus.   Objective: Vital signs in last 24 hours: Temp:  [97.6 F (36.4 C)-99.6 F (37.6 C)] 98.5 F (36.9 C) (06/30 0441) Pulse Rate:  [69-98] 77 (06/30 0441) Resp:  [14-20] 18 (06/30 0441) BP: (102-142)/(61-80) 118/77 (06/30 0441) SpO2:  [92 %-97 %] 94 % (06/30 0441) Last BM Date: 01/29/19  Intake/Output from previous day: 06/29 0701 - 06/30 0700 In: 2856.2 [P.O.:360; I.V.:1745.2; IV Piggyback:751] Out: 78 [Urine:2; Drains:75; Stool:1] Intake/Output this shift: No intake/output data recorded.  PE: Gen:  Alert, NAD, pleasant, cooperative Pulm:  Rate and effort normal Abd: Soft, ND, +BS, moderate TTP in LLQ with guarding, no peritonitis  Skin: no rashes noted, warm and dry Neuro: alert and oriented.    Anti-infectives: Anti-infectives (From admission, onward)   Start     Dose/Rate Route Frequency Ordered Stop   01/27/19 1015   ciprofloxacin (CIPRO) IVPB 400 mg     400 mg 200 mL/hr over 60 Minutes Intravenous Every 12 hours 01/27/19 0117     01/27/19 0700  metroNIDAZOLE (FLAGYL) IVPB 500 mg     500 mg 100 mL/hr over 60 Minutes Intravenous Every 8 hours 01/27/19 0117     01/26/19 2245  ciprofloxacin (CIPRO) IVPB 400 mg     400 mg 200 mL/hr over 60 Minutes Intravenous  Once 01/26/19 2232 01/27/19 0026   01/26/19 2245  metroNIDAZOLE (FLAGYL) IVPB 500 mg     500 mg 100 mL/hr over 60 Minutes Intravenous  Once 01/26/19 2232 01/27/19 0010      Lab Results:  Recent Labs    01/29/19 0557 01/30/19 0224  WBC 19.1* 16.9*  HGB 11.4* 11.5*  HCT 34.3* 35.6*  PLT 240 271   BMET Recent Labs    01/29/19 0557 01/30/19 0224  NA 132* 135  K 4.3 4.4  CL 99 101  CO2 26 27  GLUCOSE 110* 113*  BUN 6 6  CREATININE 0.70 0.60  CALCIUM 7.6* 7.8*   PT/INR Recent Labs    01/28/19 0542  LABPROT 15.9*  INR 1.3*   CMP     Component Value Date/Time   NA 135 01/30/2019 0224   NA 140 01/25/2019 0924   K 4.4 01/30/2019 0224   CL 101 01/30/2019 0224   CO2 27 01/30/2019 0224   GLUCOSE 113 (H) 01/30/2019 0224   BUN 6 01/30/2019 0224   BUN 8 01/25/2019 0924   CREATININE 0.60 01/30/2019 0224  CALCIUM 7.8 (L) 01/30/2019 0224   PROT 5.2 (L) 01/30/2019 0224   PROT 6.8 01/25/2019 0924   ALBUMIN 2.1 (L) 01/30/2019 0224   ALBUMIN 4.0 01/25/2019 0924   AST 9 (L) 01/30/2019 0224   ALT 8 01/30/2019 0224   ALKPHOS 66 01/30/2019 0224   BILITOT 0.6 01/30/2019 0224   BILITOT 0.4 01/25/2019 0924   GFRNONAA >60 01/30/2019 0224   GFRAA >60 01/30/2019 0224   Lipase     Component Value Date/Time   LIPASE 26 01/26/2019 2032    Studies/Results: Ct Image Guided Drainage By Percutaneous Catheter  Result Date: 01/29/2019 INDICATION: 58 year old female with acute sigmoid colonic diverticulitis complicated by intramural abscess formation. She presents for CT-guided drain placement. EXAM: CT-guided drain placement MEDICATIONS:  The patient is currently admitted to the hospital and receiving intravenous antibiotics. The antibiotics were administered within an appropriate time frame prior to the initiation of the procedure. ANESTHESIA/SEDATION: Fentanyl 100 mcg IV; Versed 2 mg IV Moderate Sedation Time:  25 minutes The patient was continuously monitored during the procedure by the interventional radiology nurse under my direct supervision. COMPLICATIONS: None immediate. PROCEDURE: Informed written consent was obtained from the patient after a thorough discussion of the procedural risks, benefits and alternatives. All questions were addressed. Maximal Sterile Barrier Technique was utilized including caps, mask, sterile gowns, sterile gloves, sterile drape, hand hygiene and skin antiseptic. A timeout was performed prior to the initiation of the procedure. A planning axial CT scan was performed. The fluid and gas collection along the mesenteric border of the sigmoid colon was successfully identified. A suitable skin entry site was selected and marked. The overlying skin was sterilely prepped and draped in the standard fashion using chlorhexidine skin prep. Local anesthesia was attained by infiltration with 1% lidocaine. A small dermatotomy was made. Under intermittent CT guidance, an 18 gauge trocar needle was carefully advanced into the fluid and gas collection. An Amplatz wire was then coiled in the collection. The skin tract was dilated to 12 [place]. A Cook 12 [place] all-purpose drainage catheter was then advanced over the wire and formed. Aspiration yields approximately 20 mL thick purulent fluid. A sample was sent for Gram stain and culture. The drain was then secured to the skin with 0 Prolene suture and sterile bandages. IMPRESSION: Successful placement of a 12 French drainage catheter into the intramural abscess along the mesenteric border of the sigmoid wall. Aspiration yields approximately 20 mL purulent fluid. PLAN: 1. Maintain drain  to JP bulb suction. 2. Flush at least once per shift. 3. Follow-up in drain clinic in 2 weeks with repeat CT scan of the pelvis with intravenous contrast and drain injection. Signed, Criselda Peaches, MD, [facility] Radiology Electronically Signed   By: Jacqulynn Cadet M.D.   On: 01/29/2019 15:10      Kalman Drape , [facility] Surgery 01/30/2019, 8:28 AM  Pager: [phone] Mon-Wed, Friday 7:00am-4:30pm Thurs 7am-11:30am  Consults: [phone]

## 2019-01-30 NOTE — Progress Notes (Signed)
Referring Physician(s): Dr Jeanmarie Hubert  Supervising Physician: Corrie Mckusick  Patient Status:  [facility] - In-pt  Chief Complaint:  LLQ abscess drain placed in IR 6/29  Subjective:  Feeling better this am Less pain OP bloody/serous Ready to go walking with walker  Allergies: Patient has no known allergies.  Medications: Prior to Admission medications   Medication Sig Start Date End Date Taking? Authorizing Provider  ALPRAZolam (XANAX) 0.25 MG tablet Take 1 tablet (0.25 mg total) by mouth daily as needed for anxiety. 08/07/18  Yes Terald Sleeper, PA-C  amoxicillin-clavulanate (AUGMENTIN) 875-125 MG tablet Take 1 tablet by mouth 2 (two) times daily for 7 days. 01/25/19 02/01/19 Yes Gottschalk, Leatrice Jewels M, DO  aspirin EC 325 MG tablet Take 325 mg by mouth daily.   Yes [provider]  busPIRone (BUSPAR) 15 MG tablet Take 1 tablet (15 mg total) by mouth 3 (three) times daily. 08/07/18  Yes Terald Sleeper, PA-C  Cholecalciferol 2000 units CAPS Take 1 capsule (2,000 Units total) by mouth daily. 08/17/16  Yes Beasley, Caren D, MD  Coenzyme Q10 (CO Q10) 100 MG CAPS Take 1 capsule by mouth daily.   Yes [provider]  DULoxetine (CYMBALTA) 30 MG capsule Take 2 capsules (60 mg total) by mouth daily. 01/01/19  Yes Terald Sleeper, PA-C  losartan-hydrochlorothiazide (HYZAAR) 100-25 MG tablet Take 1 tablet by mouth daily. 07/24/18  Yes Terald Sleeper, PA-C  metoprolol tartrate (LOPRESSOR) 25 MG tablet TAKE 1 TABLET BY MOUTH 2 TIMES DAILY. Patient taking differently: Take 25 mg by mouth 2 (two) times daily.  10/24/18  Yes Terald Sleeper, PA-C  metroNIDAZOLE (FLAGYL) 500 MG tablet Take 1 tablet (500 mg total) by mouth 3 (three) times daily. 01/25/19  Yes Gottschalk, Ashly M, DO  nitroGLYCERIN (NITROSTAT) 0.4 MG SL tablet 1 TABLET UNDER TONGUE EVERY 5 MINUTES UP TO 3 TIMES FOR CHEST PAIN THEN CALL DR IF NO RELIEF Patient taking differently: Place 0.4 mg under the tongue every 5 (five) minutes  as needed for chest pain. 1 TABLET UNDER TONGUE EVERY 5 MINUTES UP TO 3 TIMES FOR CHEST PAIN THEN CALL DR IF NO RELIEF 02/23/18  Yes Terald Sleeper, PA-C  rosuvastatin (CRESTOR) 10 MG tablet Take 1 tablet (10 mg total) by mouth daily. 07/03/18  Yes Terald Sleeper, PA-C  terbinafine (LAMISIL) 250 MG tablet Take 1 tablet (250 mg total) by mouth daily. 12/19/18  Yes Terald Sleeper, PA-C  traZODone (DESYREL) 50 MG tablet Take 1-2 tablets (50-100 mg total) by mouth at bedtime as needed for sleep. 05/03/18  Yes Jones, Angel S, PA-C  VASCEPA 1 g CAPS TAKE 2 CAPULES BY MOUTH TWICE DAILY Patient taking differently: Take 2 capsules by mouth 2 (two) times a day.  07/11/18  Yes Terald Sleeper, PA-C  Vitamin D, Ergocalciferol, (DRISDOL) 1.25 MG (50000 UT) CAPS capsule Take 1 capsule (50,000 Units total) by mouth every 7 (seven) days. Patient taking differently: Take 50,000 Units by mouth every Sunday.  08/30/18  Yes Terald Sleeper, PA-C  econazole nitrate 1 % cream Apply topically 2 (two) times daily. 12/19/18   Terald Sleeper, PA-C     Vital Signs: BP 118/77 (BP Location: Left Arm)    Pulse 77    Temp 98.5 F (36.9 C) (Oral)    Resp 18    Ht 5\' 3"  (1.6 m)    Wt 218 lb 14.7 oz (99.3 kg)    LMP 12/31/2013  SpO2 94%    BMI 38.78 kg/m   Physical Exam Vitals signs reviewed.  Abdominal:     Palpations: Abdomen is soft.  Skin:    General: Skin is warm and dry.     Comments: Site is clean and dry Sl tender OP serous color 745 cc yesterday Gr +cocci   Neurological:     Mental Status: She is alert.     Imaging: Ct Abdomen Pelvis W Contrast  Result Date: 01/26/2019 CLINICAL DATA:  Abdominal pain with diverticulitis suspected. EXAM: CT ABDOMEN AND PELVIS WITH CONTRAST TECHNIQUE: Multidetector CT imaging of the abdomen and pelvis was performed using the standard protocol following bolus administration of intravenous contrast. CONTRAST:  132mL OMNIPAQUE IOHEXOL 300 MG/ML  SOLN COMPARISON:  CT of the pelvis  dated 11/19/2016 FINDINGS: Lower chest: The heart size is mildly enlarged. Hepatobiliary: No focal liver abnormality is seen. No gallstones, gallbladder wall thickening, or biliary dilatation. There is likely underlying hepatic steatosis. Pancreas: Unremarkable. No pancreatic ductal dilatation or surrounding inflammatory changes. Spleen: Normal in size without focal abnormality. Adrenals/Urinary Tract: There is a stable peripherally calcified cyst involving the left kidney. There is no hydronephrosis. The adrenal glands are unremarkable. There is a relatively stable hyperdense cyst involving the anterior interpolar region of the right kidney. There are punctate nonobstructing stones in the lower pole of the right kidney. The bladder is unremarkable. Stomach/Bowel: There are findings of sigmoid diverticulitis complicated by a 3.8 x 3.3 cm abscess (axial series 2, image 71). There is some reactive free fluid in the pelvis with questionable rim enhancement. There is severe sigmoid diverticulosis. The appendix is not reliably identified. There is a stable fat containing intraluminal lesion within the proximal jejunum. The stomach is unremarkable. Vascular/Lymphatic: Aortic atherosclerosis. No enlarged abdominal or pelvic lymph nodes. Reproductive: There is a partially calcified fibroid within the uterus. There is no evidence for an ovarian mass, however evaluation is somewhat limited by free fluid in the patient's pelvis. Other: There is no abdominal wall hernia. Musculoskeletal: No acute or significant osseous findings. IMPRESSION: 1. Findings consistent with acute complicated sigmoid diverticulitis. There is a 3.8 cm abscess adjacent to the sigmoid colon. There is a small to moderate volume of fluid within the pelvis with questionable rim enhancement. 2. Severe sigmoid diverticulosis. 3. Hepatic steatosis. 4. Nonobstructing stones in the lower pole the right kidney. 5.  Aortic Atherosclerosis (ICD10-I70.0).  Electronically Signed   By: Constance Holster M.D.   On: 01/26/2019 22:25   Ct Image Guided Drainage By Percutaneous Catheter  Result Date: 01/29/2019 INDICATION: 58 year old female with acute sigmoid colonic diverticulitis complicated by intramural abscess formation. She presents for CT-guided drain placement. EXAM: CT-guided drain placement MEDICATIONS: The patient is currently admitted to the hospital and receiving intravenous antibiotics. The antibiotics were administered within an appropriate time frame prior to the initiation of the procedure. ANESTHESIA/SEDATION: Fentanyl 100 mcg IV; Versed 2 mg IV Moderate Sedation Time:  25 minutes The patient was continuously monitored during the procedure by the interventional radiology nurse under my direct supervision. COMPLICATIONS: None immediate. PROCEDURE: Informed written consent was obtained from the patient after a thorough discussion of the procedural risks, benefits and alternatives. All questions were addressed. Maximal Sterile Barrier Technique was utilized including caps, mask, sterile gowns, sterile gloves, sterile drape, hand hygiene and skin antiseptic. A timeout was performed prior to the initiation of the procedure. A planning axial CT scan was performed. The fluid and gas collection along the mesenteric border of the sigmoid colon  was successfully identified. A suitable skin entry site was selected and marked. The overlying skin was sterilely prepped and draped in the standard fashion using chlorhexidine skin prep. Local anesthesia was attained by infiltration with 1% lidocaine. A small dermatotomy was made. Under intermittent CT guidance, an 18 gauge trocar needle was carefully advanced into the fluid and gas collection. An Amplatz wire was then coiled in the collection. The skin tract was dilated to 12 [place]. A Cook 12 [place] all-purpose drainage catheter was then advanced over the wire and formed. Aspiration yields approximately 20 mL thick  purulent fluid. A sample was sent for Gram stain and culture. The drain was then secured to the skin with 0 Prolene suture and sterile bandages. IMPRESSION: Successful placement of a 12 French drainage catheter into the intramural abscess along the mesenteric border of the sigmoid wall. Aspiration yields approximately 20 mL purulent fluid. PLAN: 1. Maintain drain to JP bulb suction. 2. Flush at least once per shift. 3. Follow-up in drain clinic in 2 weeks with repeat CT scan of the pelvis with intravenous contrast and drain injection. Signed, Criselda Peaches, MD, [facility] Electronically Signed   By: Jacqulynn Cadet M.D.   On: 01/29/2019 15:10    Labs:  CBC: Recent Labs    01/27/19 0718 01/28/19 0542 01/29/19 0557 01/30/19 0224  WBC 15.4* 17.0* 19.1* 16.9*  HGB 12.4 12.7 11.4* 11.5*  HCT 37.9 40.7 34.3* 35.6*  PLT 217 249 240 271    COAGS: Recent Labs    01/26/19 2032 01/28/19 0542  INR 1.0 1.3*    BMP: Recent Labs    01/27/19 0718 01/28/19 0542 01/29/19 0557 01/30/19 0224  NA 134* 134* 132* 135  K 3.5 3.1* 4.3 4.4  CL 99 100 99 101  CO2 26 26 26 27   GLUCOSE 109* 95 110* 113*  BUN 11 10 6 6   CALCIUM 7.7* 7.7* 7.6* 7.8*  CREATININE 0.57 0.65 0.70 0.60  GFRNONAA >60 >60 >60 >60  GFRAA >60 >60 >60 >60    LIVER FUNCTION TESTS: Recent Labs    01/27/19 0718 01/28/19 0542 01/29/19 0557 01/30/19 0224  BILITOT 0.6 0.4 0.2* 0.6  AST 10* 12* 10* 9*  ALT 12 11 9 8   ALKPHOS 53 63 69 66  PROT 5.9* 6.3* 5.5* 5.2*  ALBUMIN 2.8* 3.0* 2.2* 2.1*    Assessment and Plan:  LLQ abscess drain intact Will follow  Electronically Signed: Lavonia Drafts, PA-C 01/30/2019, 7:37 AM   I spent a total of 15 Minutes at the the patient's bedside AND on the patient's hospital floor or unit, greater than 50% of which was counseling/coordinating care for LLQ abscess drain

## 2019-01-30 NOTE — Progress Notes (Signed)
Pharmacy Antibiotic Note  Heather Lam is a 58 y.o. female admitted on 5/50/1586 with complicated sigmoid diverticulitis with abscess, s/p drain placement on 6/29. Patient initially placed on Cipro/Flagyl, now transitioned to Zosyn. Pharmacy has been consulted for Zosyn dosing.  Plan: Zosyn 3.375g IV q8h (each dose infused over 4 hours) Monitor renal function, cultures, clinical course  Height: 5\' 3"  (160 cm) Weight: 218 lb 14.7 oz (99.3 kg) IBW/kg (Calculated) : 52.4  Temp (24hrs), Avg:98.5 F (36.9 C), Min:97.6 F (36.4 C), Max:99.6 F (37.6 C)  Recent Labs  Lab 01/26/19 2032 01/26/19 2033 01/26/19 2240 01/27/19 0718 01/28/19 0542 01/29/19 0557 01/30/19 0224  WBC 11.5*  --   --  15.4* 17.0* 19.1* 16.9*  CREATININE 0.58  --   --  0.57 0.65 0.70 0.60  LATICACIDVEN  --  1.0 1.0  --   --   --   --     Estimated Creatinine Clearance: 86.2 mL/min (by C-G formula based on SCr of 0.6 mg/dL).    No Known Allergies   Thank you for allowing pharmacy to be a part of this patient's care.  Lindell Spar, PharmD, BCPS Clinical Pharmacist  01/30/2019 10:50 AM

## 2019-01-30 NOTE — Progress Notes (Signed)
Patient refuses CPAP 

## 2019-01-30 NOTE — Progress Notes (Signed)
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  Patient says that she was seen by her PCP yesterday for ongoing diarrhea and abdominal pain for past 5 days.  Her PCP diagnosed clinically as diverticulitis and started on Augmentin and Flagyl.  Patient says that she did not feel better and in fact her bloating got worse with significant worsening of abdominal pain.  Patient came to the hospital for further evaluation.  CT scan of the abdomen pelvis showed acute complicated sigmoid diverticulitis.  There is 3.8 cm abscess adjacent to sigmoid colon.  Small to moderate volume of fluid within the pelvis with questionable rim enhancement.  General surgery was consulted by ED physician.  General surgery recommended IV antibiotics and fluids.  Patient will likely need IR evaluation for drainage of abscess.  General surgery to see patient in a.m. She complains of low-grade fever Also had nonbloody diarrhea. Denies chest pain or shortness of breath. Denies dysuria   Subjective: 6/30 A/O x4, negative CP, negative S OB positive abdominal pain but significantly improved from 6/29.  Has been drinking clear liquids without adverse effects.   Assessment & Plan:   Active Problems:   Acute diverticulitis   OSA on CPAP   Hyperlipidemia   Generalized anxiety disorder   Essential hypertension   Insomnia   Obesity (BMI 30-39.9)   Recurrent major depressive disorder, in partial remission ([place])   Adjustment disorder   Depression   History of MI (myocardial infarction)   Diverticulitis   Hypokalemia   Hypomagnesemia   Intra-abdominal abscess (HCC)   Left lower quadrant abdominal pain  Sigmoid diverticulitis  with abscess(positive GPC) - S/p JP drain LL quadrant abdomen drain mostly serosanguineous fluid at this point. - Flush once per shift. - Cultures pending. - Continue current antibiotics. - Have consulted [facility], discussed case with Dr. Rolm Bookbinder.  They will see patient in give recommendations on sending patient home on JP drain +ABx  vs performing surgery prior to patient departing hospital. -Maintain patient n.p.o. except for ice chips to wet lips and mouth -While patient hospitalized will conduct Risk stratification for future surgery. - Patient feeling better today, if she continues to improve my thought is [facility] will recommend sending patient home for couple weeks of antibiotics.  -Schedule follow-up appointment in 2 weeks with Dr. Leighton Ruff for removal of sigmoid colon Drain and schedule outpatient future surgery.    Anxiety/depression - Xanax 0.25 mg daily as needed (hold) - Buspirone 15 mg TID (hold) -Cymbalta 60 mg daily (hold) -Trazodone 50 - 100 mg nightly as needed (hold) -Ativan 0.5-1 IV  Mg PRN  Chronic diastolic CHF - See essential hypertension  Essential HTN/CAD -S/p stent placement -Metoprolol 25 mg p.o. (hold) - Metoprolol IV 5 mg   Hx MI  -See HTN  Prediabetes - Meets guidelines for prediabetes hemoglobin A1c 5.7-6.4 - 6/30 hemoglobin A1c= 5.8 - When patient's got recover sufficiently to keep solids down would start her on metformin 500 mg BID  HLD -Patient does not have hyperlipidemia per NCEP guidelines.    OSA -CPAP per respiratory    DVT prophylaxis: SCD -->Lovenox on 6/30 Code Status: Full Family Communication: 6/29 daughter was on phone for discussion of plan  of care while myself and PA Brooke from [facility] was present. Disposition Plan: TBD   Consultants:  General surgery Dr. Arnoldo Morale IR [facility]    Procedures/Significant Events:  6/26 CT abdomen pelvis W contrast: acute complicated sigmoid diverticulitis. There is a 3.8 cm abscess  adjacent to the sigmoid colon.  -small to moderate volume of fluid within the pelvis with questionable rim enhancement. -Severe sigmoid diverticulosis. -Hepatic steatosis. -Nonobstructing stones in the lower pole the right kidney. 6/29 IR abscess drainage placed 12 French drain LLQ diverticular abscess aspirated 57ml thick purulent fluid 6/30 echocardiogram:Left Ventricle: LVEF = 55-60%.  -Doppler parameters are consistent  with impaired relaxation.  Left Atrium: mild-moderately dilated.   I have personally reviewed and interpreted all radiology studies and my findings are as above.  VENTILATOR SETTINGS:    Cultures 6/29 LLQ diverticular abscess positive GPC    Antimicrobials: Anti-infectives (From admission, onward)   Start     Stop   01/27/19 1015  ciprofloxacin (CIPRO) IVPB 400 mg         01/27/19 0700  metroNIDAZOLE (FLAGYL) IVPB 500 mg         01/26/19 2245  ciprofloxacin (CIPRO) IVPB 400 mg     01/27/19 0026   01/26/19 2245  metroNIDAZOLE (FLAGYL) IVPB 500 mg     01/27/19 0010       Devices    LINES / TUBES:  12 French LLQ diverticular JP drain 6/29>>>     Continuous Infusions: . sodium chloride Stopped (01/30/19 0612)  . ciprofloxacin Stopped (01/30/19 0307)  . metronidazole 100 mL/hr at 01/30/19 4196     Objective: Vitals:   01/29/19 1520 01/29/19 2053 01/30/19 0018 01/30/19 0441  BP: 133/78 124/64 102/61 118/77  Pulse: 90 85 69 77  Resp:  18 16 18   Temp:  99.6 F (37.6 C) 98.7 F (37.1 C) 98.5 F (36.9 C)  TempSrc:  Oral Oral Oral  SpO2: 93% 92% 93% 94%  Weight:      Height:        Intake/Output Summary (Last 24 hours) at 01/30/2019 0901 Last data filed at 01/30/2019 2229 Gross per 24 hour  Intake 2856.17 ml  Output 78 ml  Net 2778.17 ml   Filed Weights   01/27/19 0138 01/28/19 1413  Weight: 92.3 kg 99.3 kg   Physical Exam:  General:A/O x4, no acute respiratory distress Eyes: negative scleral hemorrhage, negative anisocoria,  negative icterus ENT: Negative Runny nose, negative gingival bleeding, Neck:  Negative scars, masses, torticollis, lymphadenopathy, JVD Lungs: Clear to auscultation bilaterally without wheezes or crackles Cardiovascular: Regular rate and rhythm without murmur gallop or rub normal S1 and S2 Abdomen: RUQ/LLQ abdominal pain to palpation, hypoactive bowel sounds, LLQ JP drain in place draining mostly serosanguineous fluid Extremities: No significant cyanosis, clubbing, or edema bilateral lower extremities Skin: Negative rashes, lesions, ulcers Psychiatric:  Negative depression, negative anxiety, negative fatigue, negative mania  Central nervous system:  Cranial nerves II through XII intact, tongue/uvula midline, all extremities muscle strength 5/5, sensation intact throughout,  negative dysarthria, negative expressive aphasia, negative receptive aphasia..     Data Reviewed: Care during the described time interval was provided by me .  I have reviewed this patient's available data, including medical history, events of note, physical examination, and all test results as part of my evaluation.   CBC: Recent Labs  Lab 01/26/19 2032 01/27/19 0718 01/28/19 0542 01/29/19 0557 01/30/19 0224  WBC 11.5* 15.4* 17.0* 19.1* 16.9*  NEUTROABS 10.2*  --  14.6* 16.7*  14.2*  HGB 14.1 12.4 12.7 11.4* 11.5*  HCT 41.9 37.9 40.7 34.3* 35.6*  MCV 84.8 86.9 88.7 85.8 86.4  PLT 214 217 249 240 622   Basic Metabolic Panel: Recent Labs  Lab 01/26/19 2032 01/27/19 0718 01/28/19 0542 01/29/19 0557 01/30/19 0224  NA 132* 134* 134* 132* 135  K 2.7* 3.5 3.1* 4.3 4.4  CL 97* 99 100 99 101  CO2 24 26 26 26 27   GLUCOSE 134* 109* 95 110* 113*  BUN 11 11 10 6 6   CREATININE 0.58 0.57 0.65 0.70 0.60  CALCIUM 8.5* 7.7* 7.7* 7.6* 7.8*  MG 1.6*  --  2.1 1.9 2.0   GFR: Estimated Creatinine Clearance: 86.2 mL/min (by C-G formula based on SCr of 0.6 mg/dL). Liver Function Tests: Recent Labs  Lab 01/26/19 2032  01/27/19 0718 01/28/19 0542 01/29/19 0557 01/30/19 0224  AST 14* 10* 12* 10* 9*  ALT 14 12 11 9 8   ALKPHOS 69 53 63 69 66  BILITOT 0.6 0.6 0.4 0.2* 0.6  PROT 7.4 5.9* 6.3* 5.5* 5.2*  ALBUMIN 3.5 2.8* 3.0* 2.2* 2.1*   Recent Labs  Lab 01/26/19 2032  LIPASE 26   No results for input(s): AMMONIA in the last 168 hours. Coagulation Profile: Recent Labs  Lab 01/26/19 2032 01/28/19 0542  INR 1.0 1.3*   Cardiac Enzymes: No results for input(s): CKTOTAL, CKMB, CKMBINDEX, TROPONINI in the last 168 hours. BNP (last 3 results) No results for input(s): PROBNP in the last 8760 hours. HbA1C: Recent Labs    01/30/19 0224  HGBA1C 5.8*   CBG: No results for input(s): GLUCAP in the last 168 hours. Lipid Profile: Recent Labs    01/30/19 0224  CHOL 57  HDL 13*  LDLCALC 25  TRIG 95  CHOLHDL 4.4   Thyroid Function Tests: No results for input(s): TSH, T4TOTAL, FREET4, T3FREE, THYROIDAB in the last 72 hours. Anemia Panel: No results for input(s): VITAMINB12, FOLATE, FERRITIN, TIBC, IRON, RETICCTPCT in the last 72 hours. Urine analysis:    Component Value Date/Time   COLORURINE YELLOW 01/27/2019 0322   APPEARANCEUR CLEAR 01/27/2019 0322   APPEARANCEUR Clear 02/25/2016 0937   LABSPEC >1.046 (H) 01/27/2019 0322   PHURINE 5.0 01/27/2019 0322   GLUCOSEU NEGATIVE 01/27/2019 0322   HGBUR MODERATE (A) 01/27/2019 0322   BILIRUBINUR NEGATIVE 01/27/2019 0322   BILIRUBINUR Negative 02/25/2016 0937   KETONESUR NEGATIVE 01/27/2019 0322   PROTEINUR 100 (A) 01/27/2019 0322   UROBILINOGEN negative 03/24/2015 0937   UROBILINOGEN 0.2 05/26/2012 1230   NITRITE NEGATIVE 01/27/2019 0322   LEUKOCYTESUR TRACE (A) 01/27/2019 0322   Sepsis Labs: @LABRCNTIP (procalcitonin:4,lacticidven:4)  ) Recent Results (from the past 240 hour(s))  SARS Coronavirus 2 (CEPHEID - Performed in [place] hospital lab), Hosp Order     Status: None   Collection Time: 01/26/19 10:40 PM   Specimen:  Nasopharyngeal Swab  Result Value Ref Range Status   SARS Coronavirus 2 NEGATIVE NEGATIVE Final    Comment: (NOTE) If result is NEGATIVE SARS-CoV-2 target nucleic acids are NOT DETECTED. The SARS-CoV-2 RNA is generally detectable in upper and lower  respiratory specimens during the acute phase of infection. The lowest  concentration of SARS-CoV-2 viral copies this assay can detect is 250  copies / mL. A negative result does not preclude SARS-CoV-2 infection  and should not be used as the sole basis for treatment or other  patient management decisions.  A negative result may occur with  improper specimen collection / handling, submission of specimen  other  than nasopharyngeal swab, presence of viral mutation(s) within the  areas targeted by this assay, and inadequate number of viral copies  (<250 copies / mL). A negative result must be combined with clinical  observations, patient history, and epidemiological information. If result is POSITIVE SARS-CoV-2 target nucleic acids are DETECTED. The SARS-CoV-2 RNA is generally detectable in upper and lower  respiratory specimens dur ing the acute phase of infection.  Positive  results are indicative of active infection with SARS-CoV-2.  Clinical  correlation with patient history and other diagnostic information is  necessary to determine patient infection status.  Positive results do  not rule out bacterial infection or co-infection with other viruses. If result is PRESUMPTIVE POSTIVE SARS-CoV-2 nucleic acids MAY BE PRESENT.   A presumptive positive result was obtained on the submitted specimen  and confirmed on repeat testing.  While 2019 novel coronavirus  (SARS-CoV-2) nucleic acids may be present in the submitted sample  additional confirmatory testing may be necessary for epidemiological  and / or clinical management purposes  to differentiate between  SARS-CoV-2 and other Sarbecovirus currently known to infect humans.  If clinically  indicated additional testing with an alternate test  methodology [phone]) is advised. The SARS-CoV-2 RNA is generally  detectable in upper and lower respiratory sp ecimens during the acute  phase of infection. The expected result is Negative. Fact Sheet for Patients:  StrictlyIdeas.no Fact Sheet for Healthcare Providers: BankingDealers.co.za This test is not yet approved or cleared by the [place] FDA and has been authorized for detection and/or diagnosis of SARS-CoV-2 by FDA under an Emergency Use Authorization (EUA).  This EUA will remain in effect (meaning this test can be used) for the duration of the COVID-19 declaration under Section 564(b)(1) of the Act, 21 U.S.C. section 360bbb-3(b)(1), unless the authorization is terminated or revoked sooner. Performed at [facility], [address] [place], [place] [address]   Aerobic/Anaerobic Culture (surgical/deep wound)     Status: None (Preliminary result)   Collection Time: 01/29/19  2:19 PM   Specimen: Abscess  Result Value Ref Range Status   Specimen Description ABSCESS JP DRAINAGE  Final   Special Requests NONE  Final   Gram Stain   Final    ABUNDANT WBC PRESENT, PREDOMINANTLY PMN ABUNDANT GRAM POSITIVE COCCI Performed at [facility] [address], [place], [place] [address]    Culture PENDING  Incomplete   Report Status PENDING  Incomplete         Radiology Studies: Ct Image Guided Drainage By Percutaneous Catheter  Result Date: 01/29/2019 INDICATION: 58 year old female with acute sigmoid colonic diverticulitis complicated by intramural abscess formation. She presents for CT-guided drain placement. EXAM: CT-guided drain placement MEDICATIONS: The patient is currently admitted to the hospital and receiving intravenous antibiotics. The antibiotics were administered within an appropriate time frame prior to the initiation of the procedure. ANESTHESIA/SEDATION:  Fentanyl 100 mcg IV; Versed 2 mg IV Moderate Sedation Time:  25 minutes The patient was continuously monitored during the procedure by the interventional radiology nurse under my direct supervision. COMPLICATIONS: None immediate. PROCEDURE: Informed written consent was obtained from the patient after a thorough discussion of the procedural risks, benefits and alternatives. All questions were addressed. Maximal Sterile Barrier Technique was utilized including caps, mask, sterile gowns, sterile gloves, sterile drape, hand hygiene and skin antiseptic. A timeout was performed prior to the initiation of the procedure. A planning axial CT scan was performed. The fluid and gas collection along the mesenteric  border of the sigmoid colon was successfully identified. A suitable skin entry site was selected and marked. The overlying skin was sterilely prepped and draped in the standard fashion using chlorhexidine skin prep. Local anesthesia was attained by infiltration with 1% lidocaine. A small dermatotomy was made. Under intermittent CT guidance, an 18 gauge trocar needle was carefully advanced into the fluid and gas collection. An Amplatz wire was then coiled in the collection. The skin tract was dilated to 12 [place]. A Cook 12 [place] all-purpose drainage catheter was then advanced over the wire and formed. Aspiration yields approximately 20 mL thick purulent fluid. A sample was sent for Gram stain and culture. The drain was then secured to the skin with 0 Prolene suture and sterile bandages. IMPRESSION: Successful placement of a 12 French drainage catheter into the intramural abscess along the mesenteric border of the sigmoid wall. Aspiration yields approximately 20 mL purulent fluid. PLAN: 1. Maintain drain to JP bulb suction. 2. Flush at least once per shift. 3. Follow-up in drain clinic in 2 weeks with repeat CT scan of the pelvis with intravenous contrast and drain injection. Signed, Criselda Peaches, MD, [facility] Electronically Signed   By: Jacqulynn Cadet M.D.   On: 01/29/2019 15:10        Scheduled Meds: . aspirin EC  325 mg Oral Daily  . guaiFENesin  600 mg Oral BID  . metoprolol tartrate  5 mg Intravenous Q6H  . rosuvastatin  10 mg Oral Daily  . sodium chloride flush  5 mL Intracatheter Q8H   Continuous Infusions: . sodium chloride Stopped (01/30/19 0612)  . ciprofloxacin Stopped (01/30/19 0307)  . metronidazole 100 mL/hr at 01/30/19 8115     LOS: 3 days   The patient is critically ill with multiple organ systems failure and requires high complexity decision making for assessment and support, frequent evaluation and titration of therapies, application of advanced monitoring technologies and extensive interpretation of multiple databases. Critical Care Time devoted to patient care services described in this note  Time spent: 40 minutes     Deshana Rominger, Geraldo Docker, MD [facility] Pager [phone]  If 7PM-7AM, please contact night-coverage [email or web address] Password [email or web address] 01/30/2019, 9:01 AM

## 2019-01-30 NOTE — Progress Notes (Signed)
  Echocardiogram 2D Echocardiogram has been performed.  Heather Lam 01/30/2019, 9:56 AM

## 2019-01-31 LAB — MAGNESIUM: Magnesium: 2.2 mg/dL (ref 1.7–2.4)

## 2019-01-31 LAB — CBC
HCT: 34.8 % — ABNORMAL LOW (ref 36.0–46.0)
Hemoglobin: 11.3 g/dL — ABNORMAL LOW (ref 12.0–15.0)
MCH: 27.8 pg (ref 26.0–34.0)
MCHC: 32.5 g/dL (ref 30.0–36.0)
MCV: 85.7 fL (ref 80.0–100.0)
Platelets: 269 10*3/uL (ref 150–400)
RBC: 4.06 MIL/uL (ref 3.87–5.11)
RDW: 13.7 % (ref 11.5–15.5)
WBC: 15.2 10*3/uL — ABNORMAL HIGH (ref 4.0–10.5)
nRBC: 0 % (ref 0.0–0.2)

## 2019-01-31 MED ORDER — OXYCODONE HCL 5 MG PO TABS
5.0000 mg | ORAL_TABLET | ORAL | Status: DC | PRN
  Administered 2019-01-31 – 2019-02-03 (×13): 10 mg via ORAL
  Administered 2019-02-03: 5 mg via ORAL
  Administered 2019-02-04 (×2): 10 mg via ORAL
  Administered 2019-02-05: 5 mg via ORAL
  Filled 2019-01-31 (×8): qty 2
  Filled 2019-01-31: qty 1
  Filled 2019-01-31 (×9): qty 2

## 2019-01-31 MED ORDER — LOSARTAN POTASSIUM 50 MG PO TABS
100.0000 mg | ORAL_TABLET | Freq: Every day | ORAL | Status: DC
  Administered 2019-01-31 – 2019-02-14 (×15): 100 mg via ORAL
  Filled 2019-01-31 (×15): qty 2

## 2019-01-31 MED ORDER — HYDROMORPHONE HCL 1 MG/ML IJ SOLN
1.0000 mg | INTRAMUSCULAR | Status: DC | PRN
  Administered 2019-02-01: 1 mg via INTRAVENOUS
  Filled 2019-01-31 (×2): qty 1

## 2019-01-31 MED ORDER — DULOXETINE HCL 60 MG PO CPEP
60.0000 mg | ORAL_CAPSULE | Freq: Every day | ORAL | Status: DC
  Administered 2019-01-31 – 2019-02-14 (×14): 60 mg via ORAL
  Filled 2019-01-31 (×14): qty 1

## 2019-01-31 MED ORDER — LOSARTAN POTASSIUM-HCTZ 100-25 MG PO TABS: 1.0000 | ORAL_TABLET | Freq: Every day | ORAL | Status: DC

## 2019-01-31 MED ORDER — ALPRAZOLAM 0.25 MG PO TABS
0.2500 mg | ORAL_TABLET | Freq: Every day | ORAL | Status: DC | PRN
  Administered 2019-01-31 – 2019-02-07 (×5): 0.25 mg via ORAL
  Filled 2019-01-31 (×6): qty 1

## 2019-01-31 MED ORDER — TRAZODONE HCL 50 MG PO TABS
50.0000 mg | ORAL_TABLET | Freq: Every evening | ORAL | Status: DC | PRN
  Administered 2019-02-03 – 2019-02-13 (×6): 50 mg via ORAL
  Filled 2019-01-31: qty 1
  Filled 2019-01-31: qty 2
  Filled 2019-01-31 (×2): qty 1
  Filled 2019-01-31: qty 2
  Filled 2019-01-31 (×2): qty 1

## 2019-01-31 MED ORDER — METOPROLOL TARTRATE 25 MG PO TABS
25.0000 mg | ORAL_TABLET | Freq: Two times a day (BID) | ORAL | Status: DC
  Administered 2019-01-31 – 2019-02-14 (×27): 25 mg via ORAL
  Filled 2019-01-31 (×28): qty 1

## 2019-01-31 MED ORDER — DOCUSATE SODIUM 100 MG PO CAPS
100.0000 mg | ORAL_CAPSULE | Freq: Every day | ORAL | Status: DC
  Administered 2019-01-31 – 2019-02-06 (×5): 100 mg via ORAL
  Filled 2019-01-31 (×9): qty 1

## 2019-01-31 MED ORDER — HYDROCHLOROTHIAZIDE 25 MG PO TABS
25.0000 mg | ORAL_TABLET | Freq: Every day | ORAL | Status: DC
  Administered 2019-01-31 – 2019-02-14 (×15): 25 mg via ORAL
  Filled 2019-01-31 (×15): qty 1

## 2019-01-31 MED ORDER — BUSPIRONE HCL 15 MG PO TABS
15.0000 mg | ORAL_TABLET | Freq: Three times a day (TID) | ORAL | Status: DC
  Administered 2019-01-31 – 2019-02-14 (×42): 15 mg via ORAL
  Filled 2019-01-31 (×43): qty 1

## 2019-01-31 NOTE — Progress Notes (Signed)
[facility]/Trauma Progress Note      Assessment/Plan HTN HLD H/o MI s/p stent placement x2 2010 on aspirin 325mg  Kidney stones Tobacco abuse  Sigmoid diverticulitis with3.8 cmabscess, admission 06/27 - 4-5 prior episodes of diverticulitis, none that required hospitalization  - Last colonoscopy 2013 by Dr. Hilarie Fredrickson showed mild diverticulosis in the left colon - S/P drain placement 06/29 for the abscess.  - Hopefully she will continue to improve and can plan for elective one-stage surgery later on once her acute flare up has resolved. If she does not improve and requires surgery this admission it will likely be colectomy/colostomy.  - Continue drain, IV antibiotics, and bowel rest. Will continue to follow.  ID -cipro/flagyl 6/26-06/30; Zosyn 06/30>> VTE -SCDs, sq heparin FEN -CLD but do not advance past this until pain is improved, oxy PO for pain Foley -none Follow up -Dr. Leighton Ruff  Plan: CLD, CBC pending, continue IV abx. May need a repeat CT scan this Friday if not greatly improved   LOS: 4 days    Subjective: CC: abdominal pain  No issues overnight. She is having small amount of flatus but no BM. She feels bloated and states that happens when she has diverticulitis. No nausea, vomiting, fever or chills overnight.   Objective: Vital signs in last 24 hours: Temp:  [98.5 F (36.9 C)-99.2 F (37.3 C)] 98.5 F (36.9 C) (07/01 0505) Pulse Rate:  [74-84] 74 (07/01 0505) Resp:  [17-18] 18 (07/01 0505) BP: (130-137)/(78) 130/78 (07/01 0505) SpO2:  [95 %-96 %] 95 % (07/01 0505) Last BM Date: 02/28/19  Intake/Output from previous day: 06/30 0701 - 07/01 0700 In: 932.4 [I.V.:885.9; IV Piggyback:41.5] Out: 5 [Drains:5] Intake/Output this shift: No intake/output data recorded.  PE: Gen:  Alert, NAD, pleasant, cooperative Pulm:  Rate and effort normal Abd: Soft, mild distention, +BS, moderate TTP in LLQ with guarding, no peritonitis  Skin: no  rashes noted, warm and dry Neuro: alert and oriented.   Anti-infectives: Anti-infectives (From admission, onward)   Start     Dose/Rate Route Frequency Ordered Stop   01/30/19 1200  piperacillin-tazobactam (ZOSYN) IVPB 3.375 g     3.375 g 12.5 mL/hr over 240 Minutes Intravenous Every 8 hours 01/30/19 1045     01/27/19 1015  ciprofloxacin (CIPRO) IVPB 400 mg  Status:  Discontinued     400 mg 200 mL/hr over 60 Minutes Intravenous Every 12 hours 01/27/19 0117 01/30/19 1039   01/27/19 0700  metroNIDAZOLE (FLAGYL) IVPB 500 mg  Status:  Discontinued     500 mg 100 mL/hr over 60 Minutes Intravenous Every 8 hours 01/27/19 0117 01/30/19 1039   01/26/19 2245  ciprofloxacin (CIPRO) IVPB 400 mg     400 mg 200 mL/hr over 60 Minutes Intravenous  Once 01/26/19 2232 01/27/19 0026   01/26/19 2245  metroNIDAZOLE (FLAGYL) IVPB 500 mg     500 mg 100 mL/hr over 60 Minutes Intravenous  Once 01/26/19 2232 01/27/19 0010      Lab Results:  Recent Labs    01/29/19 0557 01/30/19 0224  WBC 19.1* 16.9*  HGB 11.4* 11.5*  HCT 34.3* 35.6*  PLT 240 271   BMET Recent Labs    01/29/19 0557 01/30/19 0224  NA 132* 135  K 4.3 4.4  CL 99 101  CO2 26 27  GLUCOSE 110* 113*  BUN 6 6  CREATININE 0.70 0.60  CALCIUM 7.6* 7.8*   PT/INR No results for input(s): LABPROT, INR in the last 72 hours. CMP  Component Value Date/Time   NA 135 01/30/2019 0224   NA 140 01/25/2019 0924   K 4.4 01/30/2019 0224   CL 101 01/30/2019 0224   CO2 27 01/30/2019 0224   GLUCOSE 113 (H) 01/30/2019 0224   BUN 6 01/30/2019 0224   BUN 8 01/25/2019 0924   CREATININE 0.60 01/30/2019 0224   CALCIUM 7.8 (L) 01/30/2019 0224   PROT 5.2 (L) 01/30/2019 0224   PROT 6.8 01/25/2019 0924   ALBUMIN 2.1 (L) 01/30/2019 0224   ALBUMIN 4.0 01/25/2019 0924   AST 9 (L) 01/30/2019 0224   ALT 8 01/30/2019 0224   ALKPHOS 66 01/30/2019 0224   BILITOT 0.6 01/30/2019 0224   BILITOT 0.4 01/25/2019 0924   GFRNONAA >60 01/30/2019 0224    GFRAA >60 01/30/2019 0224   Lipase     Component Value Date/Time   LIPASE 26 01/26/2019 2032    Studies/Results: Ct Image Guided Drainage By Percutaneous Catheter  Result Date: 01/29/2019 INDICATION: 58 year old female with acute sigmoid colonic diverticulitis complicated by intramural abscess formation. She presents for CT-guided drain placement. EXAM: CT-guided drain placement MEDICATIONS: The patient is currently admitted to the hospital and receiving intravenous antibiotics. The antibiotics were administered within an appropriate time frame prior to the initiation of the procedure. ANESTHESIA/SEDATION: Fentanyl 100 mcg IV; Versed 2 mg IV Moderate Sedation Time:  25 minutes The patient was continuously monitored during the procedure by the interventional radiology nurse under my direct supervision. COMPLICATIONS: None immediate. PROCEDURE: Informed written consent was obtained from the patient after a thorough discussion of the procedural risks, benefits and alternatives. All questions were addressed. Maximal Sterile Barrier Technique was utilized including caps, mask, sterile gowns, sterile gloves, sterile drape, hand hygiene and skin antiseptic. A timeout was performed prior to the initiation of the procedure. A planning axial CT scan was performed. The fluid and gas collection along the mesenteric border of the sigmoid colon was successfully identified. A suitable skin entry site was selected and marked. The overlying skin was sterilely prepped and draped in the standard fashion using chlorhexidine skin prep. Local anesthesia was attained by infiltration with 1% lidocaine. A small dermatotomy was made. Under intermittent CT guidance, an 18 gauge trocar needle was carefully advanced into the fluid and gas collection. An Amplatz wire was then coiled in the collection. The skin tract was dilated to 12 [place]. A Cook 12 [place] all-purpose drainage catheter was then advanced over the wire and formed.  Aspiration yields approximately 20 mL thick purulent fluid. A sample was sent for Gram stain and culture. The drain was then secured to the skin with 0 Prolene suture and sterile bandages. IMPRESSION: Successful placement of a 12 French drainage catheter into the intramural abscess along the mesenteric border of the sigmoid wall. Aspiration yields approximately 20 mL purulent fluid. PLAN: 1. Maintain drain to JP bulb suction. 2. Flush at least once per shift. 3. Follow-up in drain clinic in 2 weeks with repeat CT scan of the pelvis with intravenous contrast and drain injection. Signed, Criselda Peaches, MD, [place] [facility] Radiology Electronically Signed   By: Jacqulynn Cadet M.D.   On: 01/29/2019 15:10      Kalman Drape , [facility] Surgery 01/31/2019, 8:55 AM  Pager: [phone] Mon-Wed, Friday 7:00am-4:30pm Thurs 7am-11:30am  Consults: [phone]

## 2019-01-31 NOTE — Progress Notes (Signed)
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 sae pcp 1 day PTA  For ongoing diarrhea and abdominal pain X5 DAYS-and was diagnosed clinically as diverticulitis and started on Augmentin and Flagyl. Patient says that she did not feel better and in fact her bloating got worse with significant worsening of abdominal pain.   In the ER CT scan of the abdomen pelvis showed acute complicated sigmoid diverticulitis- 3.8 cm abscess adjacent to sigmoid colon. Small to moderate volume of fluid within the pelvis with questionable rim enhancement.  General surgery  [facility] was consulted advised IV antibiotics and IR drainage of the abscess and was admitted.  Patient had low-grade fever and nonbloody diarrhea.  Subjective: Seen this am, used bathroom- had soft BM.having flatus, no fever/nausea/vomitting. Pt c/o swelling on rt arm and rt leg- for 2 days  Assessment & Plan:  Acute sigmoid diverticulitis with abscess: With 4-5 prior episodes of diverticulitis that did not require hospitalization.  Colonoscopy 1013 with diverticulosis.  Status post drain placement 6/29 for abscess. Followed by surgery, remains on IV Zosyn.  Drain culture growing gram-positive cocci.  Remains on clear liquid diet, still having abdominal discomfort, but no fever.  WBC remains elevated at 15.2 clear.  Discussed with the surgery.  Swelling of the right upper extremity/and right lower leg.  Check duplex.  IV is being changed from right to left.  Resume her HCTZ.    Anxiety/depression on home Xanax, buspirone, Cymbalta and trazodone.  Resume home meds.  Continue PRN Ativan/Xanax   Chronic diastolic CHF: Compensated. resume HCTZ losartan as patient endorses swelling.  Essential  hypertension continue metoprolol. Resume her HCTZ losartan  CAD with prior stent: Cont asa.  Prediabetes: A1c 5.8.  We will advise her to follow-up with PCP, for now continue on diet control.  HLD:  on statins  VQQ:VZDG per respiratory  DVT prophylaxis: SCD -->Lovenox on 6/30 Code Status: Full Family Communication: 6/29 daughter was on phone for discussion of plan of care while myself and PA Brooke from [facility] was present. Disposition Plan: remains inpatient pending clinical improvement.   Consultants:  [facility]  Procedures:  DRAIN PLACEMENT FOR ABSCESS  Antimicrobials: Anti-infectives (From admission, onward)   Start     Dose/Rate Route Frequency Ordered Stop   01/30/19 1200  piperacillin-tazobactam (ZOSYN) IVPB 3.375 g     3.375 g 12.5 mL/hr over 240 Minutes Intravenous Every 8 hours 01/30/19 1045     01/27/19 1015  ciprofloxacin (CIPRO) IVPB 400 mg  Status:  Discontinued     400 mg 200 mL/hr over 60 Minutes Intravenous Every 12 hours 01/27/19 0117 01/30/19 1039   01/27/19 0700  metroNIDAZOLE (FLAGYL) IVPB 500 mg  Status:  Discontinued     500 mg 100 mL/hr over 60 Minutes Intravenous Every 8 hours 01/27/19 0117 01/30/19 1039   01/26/19 2245  ciprofloxacin (CIPRO) IVPB 400 mg     400 mg 200 mL/hr over 60 Minutes Intravenous  Once 01/26/19 2232 01/27/19 0026   01/26/19 2245  metroNIDAZOLE (FLAGYL) IVPB 500 mg     500 mg 100 mL/hr over 60 Minutes Intravenous  Once 01/26/19 2232 01/27/19 0010       Objective: Vitals:   01/30/19 0018 01/30/19 0441 01/30/19 2011 01/31/19  0505  BP: 102/61 118/77 137/78 130/78  Pulse: 69 77 84 74  Resp: 16 18 17 18   Temp: 98.7 F (37.1 C) 98.5 F (36.9 C) 99.2 F (37.3 C) 98.5 F (36.9 C)  TempSrc: Oral Oral Oral Oral  SpO2: 93% 94% 96% 95%  Weight:      Height:        Intake/Output Summary (Last 24 hours) at 01/31/2019 1312 Last data filed at 01/31/2019 0923 Gross per 24 hour  Intake 1532.43 ml  Output 5 ml  Net 1527.43 ml   Filed  Weights   01/27/19 0138 01/28/19 1413  Weight: 92.3 kg 99.3 kg   Weight change:   Body mass index is 38.78 kg/m.  Intake/Output from previous day: 06/30 0701 - 07/01 0700 In: 932.4 [I.V.:885.9; IV Piggyback:41.5] Out: 5 [Drains:5] Intake/Output this shift: Total I/O In: 600 [P.O.:600] Out: -   Examination: General exam: Calm, comfortable, not in acute distress, average built.  HEENT:Oral mucosa moist, Ear/Nose WNL grossly, dentition normal. Respiratory system: Bilateral equal air entry, no crackles and wheezing, no use of accessory muscle, non tender on palpation. Cardiovascular system: regular rate and rhythm, S1 & S2 heard, No JVD/murmurs. Gastrointestinal system: Abdomen soft, moderately distended, tender on the left side with JP drain intact Nervous System:Alert, awake and oriented at baseline. Able to move UE and LE, sensation intact. Extremities: No edema, distal peripheral pulses palpable.  Skin: No rashes,no icterus. MSK: Normal muscle bulk,tone, power   Medications:  Scheduled Meds: . aspirin EC  325 mg Oral Daily  . busPIRone  15 mg Oral TID  . docusate sodium  100 mg Oral Daily  . DULoxetine  60 mg Oral Daily  . guaiFENesin  600 mg Oral BID  . losartan-hydrochlorothiazide  1 tablet Oral Daily  . metoprolol tartrate  5 mg Intravenous Q6H  . metoprolol tartrate  25 mg Oral BID  . rosuvastatin  10 mg Oral Daily  . sodium chloride flush  5 mL Intracatheter Q8H   Continuous Infusions: . sodium chloride 100 mL/hr at 01/31/19 0606  . piperacillin-tazobactam (ZOSYN)  IV 3.375 g (01/31/19 1150)    Data Reviewed: I have personally reviewed following labs and imaging studies  CBC: Recent Labs  Lab 01/26/19 2032 01/27/19 0718 01/28/19 0542 01/29/19 0557 01/30/19 0224 01/31/19 0630  WBC 11.5* 15.4* 17.0* 19.1* 16.9* 15.2*  NEUTROABS 10.2*  --  14.6* 16.7* 14.2*  --   HGB 14.1 12.4 12.7 11.4* 11.5* 11.3*  HCT 41.9 37.9 40.7 34.3* 35.6* 34.8*  MCV 84.8 86.9  88.7 85.8 86.4 85.7  PLT 214 217 249 240 271 163   Basic Metabolic Panel: Recent Labs  Lab 01/26/19 2032 01/27/19 0718 01/28/19 0542 01/29/19 0557 01/30/19 0224 01/31/19 0630  NA 132* 134* 134* 132* 135  --   K 2.7* 3.5 3.1* 4.3 4.4  --   CL 97* 99 100 99 101  --   CO2 24 26 26 26 27   --   GLUCOSE 134* 109* 95 110* 113*  --   BUN 11 11 10 6 6   --   CREATININE 0.58 0.57 0.65 0.70 0.60  --   CALCIUM 8.5* 7.7* 7.7* 7.6* 7.8*  --   MG 1.6*  --  2.1 1.9 2.0 2.2   GFR: Estimated Creatinine Clearance: 86.2 mL/min (by C-G formula based on SCr of 0.6 mg/dL). Liver Function Tests: Recent Labs  Lab 01/26/19 2032 01/27/19 0718 01/28/19 0542 01/29/19 0557 01/30/19 0224  AST 14* 10* 12* 10*  9*  ALT 14 12 11 9 8   ALKPHOS 69 53 63 69 66  BILITOT 0.6 0.6 0.4 0.2* 0.6  PROT 7.4 5.9* 6.3* 5.5* 5.2*  ALBUMIN 3.5 2.8* 3.0* 2.2* 2.1*   Recent Labs  Lab 01/26/19 2032  LIPASE 26   No results for input(s): AMMONIA in the last 168 hours. Coagulation Profile: Recent Labs  Lab 01/26/19 2032 01/28/19 0542  INR 1.0 1.3*   Cardiac Enzymes: No results for input(s): CKTOTAL, CKMB, CKMBINDEX, TROPONINI in the last 168 hours. BNP (last 3 results) No results for input(s): PROBNP in the last 8760 hours. HbA1C: Recent Labs    01/30/19 0224  HGBA1C 5.8*   CBG: No results for input(s): GLUCAP in the last 168 hours. Lipid Profile: Recent Labs    01/30/19 0224  CHOL 57  HDL 13*  LDLCALC 25  TRIG 95  CHOLHDL 4.4   Thyroid Function Tests: No results for input(s): TSH, T4TOTAL, FREET4, T3FREE, THYROIDAB in the last 72 hours. Anemia Panel: No results for input(s): VITAMINB12, FOLATE, FERRITIN, TIBC, IRON, RETICCTPCT in the last 72 hours. Sepsis Labs: Recent Labs  Lab 01/26/19 2033 01/26/19 2240  LATICACIDVEN 1.0 1.0    Recent Results (from the past 240 hour(s))  SARS Coronavirus 2 (CEPHEID - Performed in [facility] hospital lab), Hosp Order     Status: None   Collection  Time: 01/26/19 10:40 PM   Specimen: Nasopharyngeal Swab  Result Value Ref Range Status   SARS Coronavirus 2 NEGATIVE NEGATIVE Final    Comment: (NOTE) If result is NEGATIVE SARS-CoV-2 target nucleic acids are NOT DETECTED. The SARS-CoV-2 RNA is generally detectable in upper and lower  respiratory specimens during the acute phase of infection. The lowest  concentration of SARS-CoV-2 viral copies this assay can detect is 250  copies / mL. A negative result does not preclude SARS-CoV-2 infection  and should not be used as the sole basis for treatment or other  patient management decisions.  A negative result may occur with  improper specimen collection / handling, submission of specimen other  than nasopharyngeal swab, presence of viral mutation(s) within the  areas targeted by this assay, and inadequate number of viral copies  (<250 copies / mL). A negative result must be combined with clinical  observations, patient history, and epidemiological information. If result is POSITIVE SARS-CoV-2 target nucleic acids are DETECTED. The SARS-CoV-2 RNA is generally detectable in upper and lower  respiratory specimens dur ing the acute phase of infection.  Positive  results are indicative of active infection with SARS-CoV-2.  Clinical  correlation with patient history and other diagnostic information is  necessary to determine patient infection status.  Positive results do  not rule out bacterial infection or co-infection with other viruses. If result is PRESUMPTIVE POSTIVE SARS-CoV-2 nucleic acids MAY BE PRESENT.   A presumptive positive result was obtained on the submitted specimen  and confirmed on repeat testing.  While 2019 novel coronavirus  (SARS-CoV-2) nucleic acids may be present in the submitted sample  additional confirmatory testing may be necessary for epidemiological  and / or clinical management purposes  to differentiate between  SARS-CoV-2 and other Sarbecovirus currently known  to infect humans.  If clinically indicated additional testing with an alternate test  methodology [phone]) is advised. The SARS-CoV-2 RNA is generally  detectable in upper and lower respiratory sp ecimens during the acute  phase of infection. The expected result is Negative. Fact Sheet for Patients:  StrictlyIdeas.no Fact Sheet for Healthcare Providers:  BankingDealers.co.za This test is not yet approved or cleared by the [place] and has been authorized for detection and/or diagnosis of SARS-CoV-2 by FDA under an Emergency Use Authorization (EUA).  This EUA will remain in effect (meaning this test can be used) for the duration of the COVID-19 declaration under Section 564(b)(1) of the Act, 21 U.S.C. section 360bbb-3(b)(1), unless the authorization is terminated or revoked sooner. Performed at [facility], [address], [place], [place] [address]   Aerobic/Anaerobic Culture (surgical/deep wound)     Status: None (Preliminary result)   Collection Time: 01/29/19  2:19 PM   Specimen: Abscess  Result Value Ref Range Status   Specimen Description ABSCESS JP DRAINAGE  Final   Special Requests NONE  Final   Gram Stain   Final    ABUNDANT WBC PRESENT, PREDOMINANTLY PMN ABUNDANT GRAM POSITIVE COCCI Performed at [facility] [address] [place], [place] [address]    Culture   Final    FEW GRAM NEGATIVE RODS CULTURE REINCUBATED FOR BETTER GROWTH NO ANAEROBES ISOLATED; CULTURE IN PROGRESS FOR 5 DAYS    Report Status PENDING  Incomplete      Radiology Studies: No results found.    LOS: 4 days   Time spent: More than 50% of that time was spent in counseling and/or coordination of care.  Antonieta Pert, MD [facility]  01/31/2019, 1:12 PM

## 2019-01-31 NOTE — Progress Notes (Signed)
Patient refused CPAP for tonight. RT instructed patient to have RT called if she changes her mind. RT will monitor as needed. ?

## 2019-02-01 ENCOUNTER — Inpatient Hospital Stay (HOSPITAL_COMMUNITY): Payer: No Typology Code available for payment source

## 2019-02-01 DIAGNOSIS — M7989 Other specified soft tissue disorders: Secondary | ICD-10-CM

## 2019-02-01 LAB — BASIC METABOLIC PANEL
Anion gap: 11 (ref 5–15)
BUN: <5 mg/dL — ABNORMAL LOW (ref 6–20)
CO2: 26 mmol/L (ref 22–32)
Calcium: 7.7 mg/dL — ABNORMAL LOW (ref 8.9–10.3)
Chloride: 99 mmol/L (ref 98–111)
Creatinine, Ser: 0.42 mg/dL — ABNORMAL LOW (ref 0.44–1.00)
GFR calc Af Amer: >60 mL/min (ref >60)
GFR calc non Af Amer: >60 mL/min (ref >60)
Glucose, Bld: 92 mg/dL (ref 70–99)
Potassium: 3 mmol/L — ABNORMAL LOW (ref 3.5–5.1)
Sodium: 136 mmol/L (ref 135–145)

## 2019-02-01 LAB — MAGNESIUM: Magnesium: 1.9 mg/dL (ref 1.7–2.4)

## 2019-02-01 LAB — CBC
HCT: 33.1 % — ABNORMAL LOW (ref 36.0–46.0)
Hemoglobin: 11 g/dL — ABNORMAL LOW (ref 12.0–15.0)
MCH: 27.9 pg (ref 26.0–34.0)
MCHC: 33.2 g/dL (ref 30.0–36.0)
MCV: 84 fL (ref 80.0–100.0)
Platelets: 284 10*3/uL (ref 150–400)
RBC: 3.94 MIL/uL (ref 3.87–5.11)
RDW: 13.5 % (ref 11.5–15.5)
WBC: 15.9 10*3/uL — ABNORMAL HIGH (ref 4.0–10.5)
nRBC: 0 % (ref 0.0–0.2)

## 2019-02-01 MED ORDER — POTASSIUM CHLORIDE CRYS ER 20 MEQ PO TBCR
30.0000 meq | EXTENDED_RELEASE_TABLET | ORAL | Status: AC
  Administered 2019-02-01 (×2): 30 meq via ORAL
  Filled 2019-02-01 (×2): qty 1

## 2019-02-01 NOTE — Progress Notes (Signed)
Right upper extremity and lower extremity venous duplexes have been completed. Preliminary results can be found in CV Proc through chart review.   02/01/19 8:34 AM Heather Lam RVT

## 2019-02-01 NOTE — Progress Notes (Signed)
Referring Physician(s): Jeanmarie Hubert  Supervising Physician: Corrie Mckusick  Patient Status:  [facility] - In-pt  Chief Complaint:  Abdominal pain/abscess  Subjective: Pt still has some LLQ soreness at drain site but better than prior to drain placement; denies N/V; feels " bloated"   Allergies: Patient has no known allergies.  Medications: Prior to Admission medications   Medication Sig Start Date End Date Taking? Authorizing Provider  ALPRAZolam (XANAX) 0.25 MG tablet Take 1 tablet (0.25 mg total) by mouth daily as needed for anxiety. 08/07/18  Yes Terald Sleeper, PA-C  amoxicillin-clavulanate (AUGMENTIN) 875-125 MG tablet Take 1 tablet by mouth 2 (two) times daily for 7 days. 01/25/19 02/01/19 Yes Gottschalk, Leatrice Jewels M, DO  aspirin EC 325 MG tablet Take 325 mg by mouth daily.   Yes [provider]  busPIRone (BUSPAR) 15 MG tablet Take 1 tablet (15 mg total) by mouth 3 (three) times daily. 08/07/18  Yes Terald Sleeper, PA-C  Cholecalciferol 2000 units CAPS Take 1 capsule (2,000 Units total) by mouth daily. 08/17/16  Yes Beasley, Caren D, MD  Coenzyme Q10 (CO Q10) 100 MG CAPS Take 1 capsule by mouth daily.   Yes [provider]  DULoxetine (CYMBALTA) 30 MG capsule Take 2 capsules (60 mg total) by mouth daily. 01/01/19  Yes Terald Sleeper, PA-C  losartan-hydrochlorothiazide (HYZAAR) 100-25 MG tablet Take 1 tablet by mouth daily. 07/24/18  Yes Terald Sleeper, PA-C  metoprolol tartrate (LOPRESSOR) 25 MG tablet TAKE 1 TABLET BY MOUTH 2 TIMES DAILY. Patient taking differently: Take 25 mg by mouth 2 (two) times daily.  10/24/18  Yes Terald Sleeper, PA-C  metroNIDAZOLE (FLAGYL) 500 MG tablet Take 1 tablet (500 mg total) by mouth 3 (three) times daily. 01/25/19  Yes Gottschalk, Ashly M, DO  nitroGLYCERIN (NITROSTAT) 0.4 MG SL tablet 1 TABLET UNDER TONGUE EVERY 5 MINUTES UP TO 3 TIMES FOR CHEST PAIN THEN CALL DR IF NO RELIEF Patient taking differently: Place 0.4 mg under the tongue every 5  (five) minutes as needed for chest pain. 1 TABLET UNDER TONGUE EVERY 5 MINUTES UP TO 3 TIMES FOR CHEST PAIN THEN CALL DR IF NO RELIEF 02/23/18  Yes Terald Sleeper, PA-C  rosuvastatin (CRESTOR) 10 MG tablet Take 1 tablet (10 mg total) by mouth daily. 07/03/18  Yes Terald Sleeper, PA-C  terbinafine (LAMISIL) 250 MG tablet Take 1 tablet (250 mg total) by mouth daily. 12/19/18  Yes Terald Sleeper, PA-C  traZODone (DESYREL) 50 MG tablet Take 1-2 tablets (50-100 mg total) by mouth at bedtime as needed for sleep. 05/03/18  Yes Jones, Angel S, PA-C  VASCEPA 1 g CAPS TAKE 2 CAPULES BY MOUTH TWICE DAILY Patient taking differently: Take 2 capsules by mouth 2 (two) times a day.  07/11/18  Yes Terald Sleeper, PA-C  Vitamin D, Ergocalciferol, (DRISDOL) 1.25 MG (50000 UT) CAPS capsule Take 1 capsule (50,000 Units total) by mouth every 7 (seven) days. Patient taking differently: Take 50,000 Units by mouth every Sunday.  08/30/18  Yes Terald Sleeper, PA-C  econazole nitrate 1 % cream Apply topically 2 (two) times daily. 12/19/18   Terald Sleeper, PA-C     Vital Signs: BP 135/63 (BP Location: Right Arm)    Pulse 69    Temp 98.4 F (36.9 C) (Oral)    Resp 18    Ht 5' 3"  (1.6 m)    Wt 218 lb 14.7 oz (99.3 kg)    LMP 12/31/2013  SpO2 93%    BMI 38.78 kg/m   Physical Exam awake/alert; LLQ drain intact, insertion site ok, output minimal; drain flushed but resistance met with little return  Imaging: Ct Image Guided Drainage By Percutaneous Catheter  Result Date: 01/29/2019 INDICATION: 58 year old female with acute sigmoid colonic diverticulitis complicated by intramural abscess formation. She presents for CT-guided drain placement. EXAM: CT-guided drain placement MEDICATIONS: The patient is currently admitted to the hospital and receiving intravenous antibiotics. The antibiotics were administered within an appropriate time frame prior to the initiation of the procedure. ANESTHESIA/SEDATION: Fentanyl 100 mcg IV; Versed 2  mg IV Moderate Sedation Time:  25 minutes The patient was continuously monitored during the procedure by the interventional radiology nurse under my direct supervision. COMPLICATIONS: None immediate. PROCEDURE: Informed written consent was obtained from the patient after a thorough discussion of the procedural risks, benefits and alternatives. All questions were addressed. Maximal Sterile Barrier Technique was utilized including caps, mask, sterile gowns, sterile gloves, sterile drape, hand hygiene and skin antiseptic. A timeout was performed prior to the initiation of the procedure. A planning axial CT scan was performed. The fluid and gas collection along the mesenteric border of the sigmoid colon was successfully identified. A suitable skin entry site was selected and marked. The overlying skin was sterilely prepped and draped in the standard fashion using chlorhexidine skin prep. Local anesthesia was attained by infiltration with 1% lidocaine. A small dermatotomy was made. Under intermittent CT guidance, an 18 gauge trocar needle was carefully advanced into the fluid and gas collection. An Amplatz wire was then coiled in the collection. The skin tract was dilated to 12 [place]. A Cook 12 [place] all-purpose drainage catheter was then advanced over the wire and formed. Aspiration yields approximately 20 mL thick purulent fluid. A sample was sent for Gram stain and culture. The drain was then secured to the skin with 0 Prolene suture and sterile bandages. IMPRESSION: Successful placement of a 12 French drainage catheter into the intramural abscess along the mesenteric border of the sigmoid wall. Aspiration yields approximately 20 mL purulent fluid. PLAN: 1. Maintain drain to JP bulb suction. 2. Flush at least once per shift. 3. Follow-up in drain clinic in 2 weeks with repeat CT scan of the pelvis with intravenous contrast and drain injection. Signed, Criselda Peaches, MD, [facility] Electronically Signed   By: Jacqulynn Cadet M.D.   On: 01/29/2019 15:10   Vas [place] Lower Extremity Venous (dvt)  Result Date: 02/01/2019  Lower Venous Study Indications: Swelling.  Risk Factors: None identified. Comparison Study: No prior studies. Performing Technologist: Oliver Hum RVT  Examination Guidelines: A complete evaluation includes B-mode imaging, spectral Doppler, color Doppler, and power Doppler as needed of all accessible portions of each vessel. Bilateral testing is considered an integral part of a complete examination. Limited examinations for reoccurring indications may be performed as noted.  +---------+---------------+---------+-----------+----------+-------+  RIGHT     Compressibility Phasicity Spontaneity Properties Summary  +---------+---------------+---------+-----------+----------+-------+  CFV       Full            Yes       Yes                             +---------+---------------+---------+-----------+----------+-------+  SFJ       Full                                                      +---------+---------------+---------+-----------+----------+-------+  FV Prox   Full                                                      +---------+---------------+---------+-----------+----------+-------+  FV Mid    Full                                                      +---------+---------------+---------+-----------+----------+-------+  FV Distal Full                                                      +---------+---------------+---------+-----------+----------+-------+  PFV       Full                                                      +---------+---------------+---------+-----------+----------+-------+  POP       Full            Yes       Yes                             +---------+---------------+---------+-----------+----------+-------+  PTV       Full                                                       +---------+---------------+---------+-----------+----------+-------+  PERO      Full                                                      +---------+---------------+---------+-----------+----------+-------+   +----+---------------+---------+-----------+----------+-------+  LEFT Compressibility Phasicity Spontaneity Properties Summary  +----+---------------+---------+-----------+----------+-------+  CFV  Full            Yes       Yes                             +----+---------------+---------+-----------+----------+-------+     Summary: Right: There is no evidence of deep vein thrombosis in the lower extremity. No cystic structure found in the popliteal fossa. Left: No evidence of common femoral vein obstruction.  *See table(s) above for measurements and observations.    Preliminary    Vas [place] Upper Extremity Venous Duplex  Result Date: 02/01/2019 UPPER VENOUS STUDY  Indications: Swelling Risk Factors: None identified. Comparison Study: No prior studies. Performing Technologist: Oliver Hum RVT  Examination Guidelines: A complete evaluation includes B-mode imaging, spectral Doppler, color Doppler, and power Doppler as needed of all accessible portions of each vessel. Bilateral testing is considered an integral part of  a complete examination. Limited examinations for reoccurring indications may be performed as noted.  Right Findings: +----------+------------+---------+-----------+----------+-------+  RIGHT      Compressible Phasicity Spontaneous Properties Summary  +----------+------------+---------+-----------+----------+-------+  IJV            Full        Yes        Yes                         +----------+------------+---------+-----------+----------+-------+  Subclavian     Full        Yes        Yes                         +----------+------------+---------+-----------+----------+-------+  Axillary       Full        Yes        Yes                          +----------+------------+---------+-----------+----------+-------+  Brachial       Full        Yes        Yes                         +----------+------------+---------+-----------+----------+-------+  Radial         Full                                               +----------+------------+---------+-----------+----------+-------+  Ulnar          Full                                               +----------+------------+---------+-----------+----------+-------+  Cephalic       Full                                               +----------+------------+---------+-----------+----------+-------+  Basilic        Full                                               +----------+------------+---------+-----------+----------+-------+  Left Findings: +----------+------------+---------+-----------+----------+-------+  LEFT       Compressible Phasicity Spontaneous Properties Summary  +----------+------------+---------+-----------+----------+-------+  Subclavian     Full        Yes        Yes                         +----------+------------+---------+-----------+----------+-------+  Summary:  Right: No evidence of deep vein thrombosis in the upper extremity. No evidence of superficial vein thrombosis in the upper extremity.  Left: No evidence of thrombosis in the subclavian.  *See table(s) above for measurements and observations.    Preliminary     Labs:  CBC: Recent Labs    01/29/19 0557 01/30/19 0224 01/31/19 0630 02/01/19 0527  WBC 19.1*  16.9* 15.2* 15.9*  HGB 11.4* 11.5* 11.3* 11.0*  HCT 34.3* 35.6* 34.8* 33.1*  PLT 240 271 269 284    COAGS: Recent Labs    01/26/19 2032 01/28/19 0542  INR 1.0 1.3*    BMP: Recent Labs    01/28/19 0542 01/29/19 0557 01/30/19 0224 02/01/19 0527  NA 134* 132* 135 136  K 3.1* 4.3 4.4 3.0*  CL 100 99 101 99  CO2 26 26 27 26   GLUCOSE 95 110* 113* 92  BUN 10 6 6  <5*  CALCIUM 7.7* 7.6* 7.8* 7.7*  CREATININE 0.65 0.70 0.60 0.42*  GFRNONAA >60 >60 >60 >60    GFRAA >60 >60 >60 >60    LIVER FUNCTION TESTS: Recent Labs    01/27/19 0718 01/28/19 0542 01/29/19 0557 01/30/19 0224  BILITOT 0.6 0.4 0.2* 0.6  AST 10* 12* 10* 9*  ALT 12 11 9 8   ALKPHOS 53 63 69 66  PROT 5.9* 6.3* 5.5* 5.2*  ALBUMIN 2.8* 3.0* 2.2* 2.1*    Assessment and Plan: Pt with hx LLQ diverticular abscess, s/p drainage 6/29; afebrile; WBC 15.9(15.2), creat ok; K 3.0- replace; drain fluid cx- e coli/viridans strept; with no sig output from drain will recheck CT on 7/3   Electronically Signed: D. Rowe Robert, PA-C 02/01/2019, 11:49 AM   I spent a total of 15 minutes at the the patient's bedside AND on the patient's hospital floor or unit, greater than 50% of which was counseling/coordinating care for abdominal abscess drain    Patient ID: Heather Lam, female   DOB: May 28, 1961, 58 y.o.   MRN: [id number]

## 2019-02-01 NOTE — Progress Notes (Signed)
Referring Physician(s): Dr Jeanmarie Hubert  Supervising Physician: Arne Cleveland  Patient Status:  [facility] - In-pt  Chief Complaint:  Diverticular abscess  Subjective: Able to ambulate today.  Small amount of bloody drainage.  Some tenderness with flushing.  Allergies: Patient has no known allergies.  Medications: Prior to Admission medications   Medication Sig Start Date End Date Taking? Authorizing Provider  ALPRAZolam (XANAX) 0.25 MG tablet Take 1 tablet (0.25 mg total) by mouth daily as needed for anxiety. 08/07/18  Yes Terald Sleeper, PA-C  amoxicillin-clavulanate (AUGMENTIN) 875-125 MG tablet Take 1 tablet by mouth 2 (two) times daily for 7 days. 01/25/19 02/01/19 Yes Gottschalk, Leatrice Jewels M, DO  aspirin EC 325 MG tablet Take 325 mg by mouth daily.   Yes [provider]  busPIRone (BUSPAR) 15 MG tablet Take 1 tablet (15 mg total) by mouth 3 (three) times daily. 08/07/18  Yes Terald Sleeper, PA-C  Cholecalciferol 2000 units CAPS Take 1 capsule (2,000 Units total) by mouth daily. 08/17/16  Yes Beasley, Caren D, MD  Coenzyme Q10 (CO Q10) 100 MG CAPS Take 1 capsule by mouth daily.   Yes [provider]  DULoxetine (CYMBALTA) 30 MG capsule Take 2 capsules (60 mg total) by mouth daily. 01/01/19  Yes Terald Sleeper, PA-C  losartan-hydrochlorothiazide (HYZAAR) 100-25 MG tablet Take 1 tablet by mouth daily. 07/24/18  Yes Terald Sleeper, PA-C  metoprolol tartrate (LOPRESSOR) 25 MG tablet TAKE 1 TABLET BY MOUTH 2 TIMES DAILY. Patient taking differently: Take 25 mg by mouth 2 (two) times daily.  10/24/18  Yes Terald Sleeper, PA-C  metroNIDAZOLE (FLAGYL) 500 MG tablet Take 1 tablet (500 mg total) by mouth 3 (three) times daily. 01/25/19  Yes Gottschalk, Ashly M, DO  nitroGLYCERIN (NITROSTAT) 0.4 MG SL tablet 1 TABLET UNDER TONGUE EVERY 5 MINUTES UP TO 3 TIMES FOR CHEST PAIN THEN CALL DR IF NO RELIEF Patient taking differently: Place 0.4 mg under the tongue every 5 (five) minutes as needed  for chest pain. 1 TABLET UNDER TONGUE EVERY 5 MINUTES UP TO 3 TIMES FOR CHEST PAIN THEN CALL DR IF NO RELIEF 02/23/18  Yes Terald Sleeper, PA-C  rosuvastatin (CRESTOR) 10 MG tablet Take 1 tablet (10 mg total) by mouth daily. 07/03/18  Yes Terald Sleeper, PA-C  terbinafine (LAMISIL) 250 MG tablet Take 1 tablet (250 mg total) by mouth daily. 12/19/18  Yes Terald Sleeper, PA-C  traZODone (DESYREL) 50 MG tablet Take 1-2 tablets (50-100 mg total) by mouth at bedtime as needed for sleep. 05/03/18  Yes Jones, Angel S, PA-C  VASCEPA 1 g CAPS TAKE 2 CAPULES BY MOUTH TWICE DAILY Patient taking differently: Take 2 capsules by mouth 2 (two) times a day.  07/11/18  Yes Terald Sleeper, PA-C  Vitamin D, Ergocalciferol, (DRISDOL) 1.25 MG (50000 UT) CAPS capsule Take 1 capsule (50,000 Units total) by mouth every 7 (seven) days. Patient taking differently: Take 50,000 Units by mouth every Sunday.  08/30/18  Yes Terald Sleeper, PA-C  econazole nitrate 1 % cream Apply topically 2 (two) times daily. 12/19/18   Terald Sleeper, PA-C     Vital Signs: BP 140/65 (BP Location: Right Arm)    Pulse 76    Temp 99.3 F (37.4 C) (Oral)    Resp 17    Ht 5\' 3"  (1.6 m)    Wt 218 lb 14.7 oz (99.3 kg)    LMP 12/31/2013    SpO2 98%  BMI 38.78 kg/m   Physical Exam Vitals signs reviewed.   Abdomen: distended, moderately tender.  Drain in place in LLQ. Small amount of bloody output.  Resistance with flushes.   Imaging: Ct Image Guided Drainage By Percutaneous Catheter  Result Date: 01/29/2019 INDICATION: 58 year old female with acute sigmoid colonic diverticulitis complicated by intramural abscess formation. She presents for CT-guided drain placement. EXAM: CT-guided drain placement MEDICATIONS: The patient is currently admitted to the hospital and receiving intravenous antibiotics. The antibiotics were administered within an appropriate time frame prior to the initiation of the procedure. ANESTHESIA/SEDATION: Fentanyl 100 mcg IV;  Versed 2 mg IV Moderate Sedation Time:  25 minutes The patient was continuously monitored during the procedure by the interventional radiology nurse under my direct supervision. COMPLICATIONS: None immediate. PROCEDURE: Informed written consent was obtained from the patient after a thorough discussion of the procedural risks, benefits and alternatives. All questions were addressed. Maximal Sterile Barrier Technique was utilized including caps, mask, sterile gowns, sterile gloves, sterile drape, hand hygiene and skin antiseptic. A timeout was performed prior to the initiation of the procedure. A planning axial CT scan was performed. The fluid and gas collection along the mesenteric border of the sigmoid colon was successfully identified. A suitable skin entry site was selected and marked. The overlying skin was sterilely prepped and draped in the standard fashion using chlorhexidine skin prep. Local anesthesia was attained by infiltration with 1% lidocaine. A small dermatotomy was made. Under intermittent CT guidance, an 18 gauge trocar needle was carefully advanced into the fluid and gas collection. An Amplatz wire was then coiled in the collection. The skin tract was dilated to 12 [place]. A Cook 12 [place] all-purpose drainage catheter was then advanced over the wire and formed. Aspiration yields approximately 20 mL thick purulent fluid. A sample was sent for Gram stain and culture. The drain was then secured to the skin with 0 Prolene suture and sterile bandages. IMPRESSION: Successful placement of a 12 French drainage catheter into the intramural abscess along the mesenteric border of the sigmoid wall. Aspiration yields approximately 20 mL purulent fluid. PLAN: 1. Maintain drain to JP bulb suction. 2. Flush at least once per shift. 3. Follow-up in drain clinic in 2 weeks with repeat CT scan of the pelvis with intravenous contrast and drain injection. Signed, Criselda Peaches, MD, [place] [facility] Electronically Signed   By: Jacqulynn Cadet M.D.   On: 01/29/2019 15:10    Labs:  CBC: Recent Labs    01/28/19 0542 01/29/19 0557 01/30/19 0224 01/31/19 0630  WBC 17.0* 19.1* 16.9* 15.2*  HGB 12.7 11.4* 11.5* 11.3*  HCT 40.7 34.3* 35.6* 34.8*  PLT 249 240 271 269    COAGS: Recent Labs    01/26/19 2032 01/28/19 0542  INR 1.0 1.3*    BMP: Recent Labs    01/27/19 0718 01/28/19 0542 01/29/19 0557 01/30/19 0224  NA 134* 134* 132* 135  K 3.5 3.1* 4.3 4.4  CL 99 100 99 101  CO2 26 26 26 27   GLUCOSE 109* 95 110* 113*  BUN 11 10 6 6   CALCIUM 7.7* 7.7* 7.6* 7.8*  CREATININE 0.57 0.65 0.70 0.60  GFRNONAA >60 >60 >60 >60  GFRAA >60 >60 >60 >60    LIVER FUNCTION TESTS: Recent Labs    01/27/19 0718 01/28/19 0542 01/29/19 0557 01/30/19 0224  BILITOT 0.6 0.4 0.2* 0.6  AST 10* 12* 10* 9*  ALT 12 11 9 8   ALKPHOS  53 63 69 66  PROT 5.9* 6.3* 5.5* 5.2*  ALBUMIN 2.8* 3.0* 2.2* 2.1*    Assessment and Plan: Diverticular abscess s/p drain placement 6/29 Patient reports slow improvement.   SHe has been able to get OOB today and tolerate clear liquids.  Small amount of bloody drainage from LLQ drain.  Some difficulty with flushing/aspirating. PA was able to improve resistance with additional flushes.  Afebrile.  WBC 15.2 today. On Zosyn. IR following.  Continue current management.  Electronically Signed: Docia Barrier, PA 02/01/2019, 12:12 AM   I spent a total of 15 Minutes at the the patient's bedside AND on the patient's hospital floor or unit, greater than 50% of which was counseling/coordinating care for LLQ abscess drain

## 2019-02-01 NOTE — Progress Notes (Addendum)
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 sae pcp 1 day PTA  For ongoing diarrhea and abdominal pain X5 DAYS-and was diagnosed clinically as diverticulitis and started on Augmentin and Flagyl. Patient says that she did not feel better and in fact her bloating got worse with significant worsening of abdominal pain.   In the ER CT scan of the abdomen pelvis showed acute complicated sigmoid diverticulitis- 3.8 cm abscess adjacent to sigmoid colon. Small to moderate volume of fluid within the pelvis with questionable rim enhancement.  General surgery  CCS was consulted advised IV antibiotics and IR drainage of the abscess and was admitted.  Patient had low-grade fever and nonbloody diarrhea. 7/2: Patient is slowly improving, tolerating diet having bowel movements.  Subjective: Seen this am.  Denies nausea vomiting.  Abdominal discomfort present pain present but improving from before.  Having bowel movement tolerating diet.  No fever overnight. Rt Arm swelling and right leg swelling is improving.  Assessment & Plan:  Acute sigmoid diverticulitis with abscess: With 4-5 prior episodes of diverticulitis that did not require hospitalization.  Colonoscopy previously with diverticulosis.  Status post drain placement 6/29 for abscess. Followed by surgery, remains on IV Zosyn.  Drain culture growing E. coli, pansensitive, patient's Streptococcus and final report pending.  Drain output is decreasing.  Cont diet as per general surgery appreciate input.  Noted recommendations to repeat CT in next 48 hours.  We will continue on Zosyn, leukocytosis at 15,000 range, repeat labs in the morning.   Swelling of the right upper extremity/and right lower leg.   Likely from fluid, negative for DVT in duplex. I have resumed  HCTZ 7/1.    Anxiety/depression on home Xanax, buspirone, Cymbalta and trazodone.  Resumedome meds. Continue PRN Ativan/Xanax   Chronic diastolic CHF: Compensated. resumed HCTZ losartan as patient endorsed swelling.  Essential hypertension : Blood pressure well controlled, continue home  Metoprolol, HCTZ losartan  CAD with prior stent: Cont asa.  Prediabetes: A1c 5.8.  We will advise her to follow-up with PCP, for now continue on diet control.  HLD:  on statins  LKG:MWNU per respiratory  Hypokalemia repleting with oral potassium chloride.  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 Disposition Plan: remains inpatient pending clinical improvement.   Consultants:  CCS  Procedures:  DRAIN PLACEMENT FOR ABSCESS  Antimicrobials: Anti-infectives (From admission, onward)   Start     Dose/Rate Route Frequency Ordered Stop   01/30/19 1200  piperacillin-tazobactam (ZOSYN) IVPB 3.375 g     3.375 g 12.5 mL/hr over 240 Minutes Intravenous Every 8 hours 01/30/19 1045     01/27/19 1015  ciprofloxacin (CIPRO) IVPB 400 mg  Status:  Discontinued     400 mg 200 mL/hr over 60 Minutes Intravenous Every 12 hours 01/27/19 0117 01/30/19 1039   01/27/19 0700  metroNIDAZOLE (FLAGYL) IVPB 500 mg  Status:  Discontinued     500 mg 100 mL/hr over 60 Minutes Intravenous Every 8 hours 01/27/19 0117 01/30/19 1039   01/26/19 2245  ciprofloxacin (CIPRO) IVPB 400 mg     400 mg 200 mL/hr over 60 Minutes Intravenous  Once 01/26/19 2232 01/27/19 0026   01/26/19 2245  metroNIDAZOLE (FLAGYL) IVPB 500 mg     500 mg 100 mL/hr over 60 Minutes Intravenous  Once 01/26/19 2232 01/27/19 0010       Objective: Vitals:   01/31/19 0505 01/31/19 1537 01/31/19 2002 02/01/19 0439  BP: 130/78 131/73 140/65 135/63    Pulse: 74 71 76 69  Resp: 18 14 17 18   Temp: 98.5 F (36.9 C) 98.6 F (37 C) 99.3 F (37.4 C) 98.4 F (36.9 C)  TempSrc: Oral Oral Oral Oral  SpO2: 95% 97% 98% 93%  Weight:      Height:        Intake/Output Summary (Last 24 hours) at 02/01/2019 1151 Last data filed at 02/01/2019 0940 Gross per 24 hour  Intake 1352 ml  Output 6 ml  Net 1346 ml   Filed Weights   01/27/19 0138 01/28/19 1413  Weight: 92.3 kg 99.3 kg   Weight change:   Body mass index is 38.78 kg/m.  Intake/Output from previous day: 07/01 0701 - 07/02 0700 In: 1170 [P.O.:1160] Out: 6 [Drains:6] Intake/Output this shift: Total I/O In: 782 [P.O.:782] Out: -   Examination:  General exam: Calm, comfortable, not in acute distress, older for age, average built.  HEENT:Oral mucosa moist, Ear/Nose WNL grossly, dentition normal. Respiratory system: Bilateral equal air entry, no crackles and wheezing, no use of accessory muscle, non tender on palpation. Cardiovascular system: regular rate and rhythm, S1 & S2 heard, No JVD/murmurs. Gastrointestinal system: Abdomen soft, mildly tender lower abdomen on the left side, drain in place, BS +. No hepatosplenomegaly palpable. Nervous System:Alert, awake and oriented at baseline. Able to move UE and LE, sensation intact. Extremities: Right upper extremity edema present , distal peripheral pulses palpable.  Skin: No rashes,no icterus. MSK: Normal muscle bulk,tone, power  Medications:  Scheduled Meds:  aspirin EC  325 mg Oral Daily   busPIRone  15 mg Oral TID   docusate sodium  100 mg Oral Daily   DULoxetine  60 mg Oral Daily   guaiFENesin  600 mg Oral BID   losartan  100 mg Oral Daily   And   hydrochlorothiazide  25 mg Oral Daily   metoprolol tartrate  25 mg Oral BID   rosuvastatin  10 mg Oral Daily   sodium chloride flush  5 mL Intracatheter Q8H   Continuous Infusions:  sodium chloride 50 mL/hr at 01/31/19 1511   piperacillin-tazobactam (ZOSYN)  IV  3.375 g (02/01/19 0631)    Data Reviewed: I have personally reviewed following labs and imaging studies  CBC: Recent Labs  Lab 01/26/19 2032  01/28/19 0542 01/29/19 0557 01/30/19 0224 01/31/19 0630 02/01/19 0527  WBC 11.5*   < > 17.0* 19.1* 16.9* 15.2* 15.9*  NEUTROABS 10.2*  --  14.6* 16.7* 14.2*  --   --   HGB 14.1   < > 12.7 11.4* 11.5* 11.3* 11.0*  HCT 41.9   < > 40.7 34.3* 35.6* 34.8* 33.1*  MCV 84.8   < > 88.7 85.8 86.4 85.7 84.0  PLT 214   < > 249 240 271 269 284   < > = values in this interval not displayed.   Basic Metabolic Panel: Recent Labs  Lab 01/27/19 0718 01/28/19 0542 01/29/19 0557 01/30/19 0224 01/31/19 0630 02/01/19 0527  NA 134* 134* 132* 135  --  136  K 3.5 3.1* 4.3 4.4  --  3.0*  CL 99 100 99 101  --  99  CO2  26 26 26 27   --  26  GLUCOSE 109* 95 110* 113*  --  92  BUN 11 10 6 6   --  <5*  CREATININE 0.57 0.65 0.70 0.60  --  0.42*  CALCIUM 7.7* 7.7* 7.6* 7.8*  --  7.7*  MG  --  2.1 1.9 2.0 2.2 1.9   GFR: Estimated Creatinine Clearance: 86.2 mL/min (A) (by C-G formula based on SCr of 0.42 mg/dL (L)). Liver Function Tests: Recent Labs  Lab 01/26/19 2032 01/27/19 0718 01/28/19 0542 01/29/19 0557 01/30/19 0224  AST 14* 10* 12* 10* 9*  ALT 14 12 11 9 8   ALKPHOS 69 53 63 69 66  BILITOT 0.6 0.6 0.4 0.2* 0.6  PROT 7.4 5.9* 6.3* 5.5* 5.2*  ALBUMIN 3.5 2.8* 3.0* 2.2* 2.1*   Recent Labs  Lab 01/26/19 2032  LIPASE 26   No results for input(s): AMMONIA in the last 168 hours. Coagulation Profile: Recent Labs  Lab 01/26/19 2032 01/28/19 0542  INR 1.0 1.3*   Cardiac Enzymes: No results for input(s): CKTOTAL, CKMB, CKMBINDEX, TROPONINI in the last 168 hours. BNP (last 3 results) No results for input(s): PROBNP in the last 8760 hours. HbA1C: Recent Labs    01/30/19 0224  HGBA1C 5.8*   CBG: No results for input(s): GLUCAP in the last 168 hours. Lipid Profile: Recent Labs    01/30/19 0224  CHOL 57  HDL 13*  LDLCALC 25  TRIG 95    CHOLHDL 4.4   Thyroid Function Tests: No results for input(s): TSH, T4TOTAL, FREET4, T3FREE, THYROIDAB in the last 72 hours. Anemia Panel: No results for input(s): VITAMINB12, FOLATE, FERRITIN, TIBC, IRON, RETICCTPCT in the last 72 hours. Sepsis Labs: Recent Labs  Lab 01/26/19 2033 01/26/19 2240  LATICACIDVEN 1.0 1.0    Recent Results (from the past 240 hour(s))  SARS Coronavirus 2 (CEPHEID - Performed in [facility] hospital lab), Hosp Order     Status: None   Collection Time: 01/26/19 10:40 PM   Specimen: Nasopharyngeal Swab  Result Value Ref Range Status   SARS Coronavirus 2 NEGATIVE NEGATIVE Final    Comment: (NOTE) If result is NEGATIVE SARS-CoV-2 target nucleic acids are NOT DETECTED. The SARS-CoV-2 RNA is generally detectable in upper and lower  respiratory specimens during the acute phase of infection. The lowest  concentration of SARS-CoV-2 viral copies this assay can detect is 250  copies / mL. A negative result does not preclude SARS-CoV-2 infection  and should not be used as the sole basis for treatment or other  patient management decisions.  A negative result may occur with  improper specimen collection / handling, submission of specimen other  than nasopharyngeal swab, presence of viral mutation(s) within the  areas targeted by this assay, and inadequate number of viral copies  (<250 copies / mL). A negative result must be combined with clinical  observations, patient history, and epidemiological information. If result is POSITIVE SARS-CoV-2 target nucleic acids are DETECTED. The SARS-CoV-2 RNA is generally detectable in upper and lower  respiratory specimens dur ing the acute phase of infection.  Positive  results are indicative of active infection with SARS-CoV-2.  Clinical  correlation with patient history and other diagnostic information is  necessary to determine patient infection status.  Positive results do  not rule out bacterial infection or  co-infection with other viruses. If result is PRESUMPTIVE POSTIVE SARS-CoV-2 nucleic acids MAY BE PRESENT.   A presumptive positive result was obtained on the submitted specimen  and confirmed  on repeat testing.  While 2019 novel coronavirus  (SARS-CoV-2) nucleic acids may be present in the submitted sample  additional confirmatory testing may be necessary for epidemiological  and / or clinical management purposes  to differentiate between  SARS-CoV-2 and other Sarbecovirus currently known to infect humans.  If clinically indicated additional testing with an alternate test  methodology [phone]) is advised. The SARS-CoV-2 RNA is generally  detectable in upper and lower respiratory sp ecimens during the acute  phase of infection. The expected result is Negative. Fact Sheet for Patients:  StrictlyIdeas.no Fact Sheet for Healthcare Providers: BankingDealers.co.za This test is not yet approved or cleared by the [place] FDA and has been authorized for detection and/or diagnosis of SARS-CoV-2 by FDA under an Emergency Use Authorization (EUA).  This EUA will remain in effect (meaning this test can be used) for the duration of the COVID-19 declaration under Section 564(b)(1) of the Act, 21 U.S.C. section 360bbb-3(b)(1), unless the authorization is terminated or revoked sooner. Performed at [facility], [address], [place], [place] [address]   Aerobic/Anaerobic Culture (surgical/deep wound)     Status: None (Preliminary result)   Collection Time: 01/29/19  2:19 PM   Specimen: Abscess  Result Value Ref Range Status   Specimen Description ABSCESS JP DRAINAGE  Final   Special Requests NONE  Final   Gram Stain   Final    ABUNDANT WBC PRESENT, PREDOMINANTLY PMN ABUNDANT GRAM POSITIVE COCCI Performed at [facility] [address] [place], [place] [address]    Culture   Final    FEW ESCHERICHIA COLI MODERATE VIRIDANS  STREPTOCOCCUS CULTURE REINCUBATED FOR BETTER GROWTH NO ANAEROBES ISOLATED; CULTURE IN PROGRESS FOR 5 DAYS    Report Status PENDING  Incomplete   Organism ID, Bacteria ESCHERICHIA COLI  Final      Susceptibility   Escherichia coli - MIC*    AMPICILLIN <=2 SENSITIVE Sensitive     CEFAZOLIN <=4 SENSITIVE Sensitive     CEFEPIME <=1 SENSITIVE Sensitive     CEFTAZIDIME <=1 SENSITIVE Sensitive     CEFTRIAXONE <=1 SENSITIVE Sensitive     CIPROFLOXACIN <=0.25 SENSITIVE Sensitive     GENTAMICIN <=1 SENSITIVE Sensitive     IMIPENEM <=0.25 SENSITIVE Sensitive     TRIMETH/SULFA <=20 SENSITIVE Sensitive     AMPICILLIN/SULBACTAM <=2 SENSITIVE Sensitive     PIP/TAZO <=4 SENSITIVE Sensitive     Extended ESBL NEGATIVE Sensitive     * FEW ESCHERICHIA COLI      Radiology Studies: Vas [place] Lower Extremity Venous (dvt)  Result Date: 02/01/2019  Lower Venous Study Indications: Swelling.  Risk Factors: None identified. Comparison Study: No prior studies. Performing Technologist: Oliver Hum RVT  Examination Guidelines: A complete evaluation includes B-mode imaging, spectral Doppler, color Doppler, and power Doppler as needed of all accessible portions of each vessel. Bilateral testing is considered an integral part of a complete examination. Limited examinations for reoccurring indications may be performed as noted.  +---------+---------------+---------+-----------+----------+-------+  RIGHT     Compressibility Phasicity Spontaneity Properties Summary  +---------+---------------+---------+-----------+----------+-------+  CFV       Full            Yes       Yes                             +---------+---------------+---------+-----------+----------+-------+  SFJ       Full                                                      +---------+---------------+---------+-----------+----------+-------+  FV Prox   Full                                                       +---------+---------------+---------+-----------+----------+-------+  FV Mid    Full                                                      +---------+---------------+---------+-----------+----------+-------+  FV Distal Full                                                      +---------+---------------+---------+-----------+----------+-------+  PFV       Full                                                      +---------+---------------+---------+-----------+----------+-------+  POP       Full            Yes       Yes                             +---------+---------------+---------+-----------+----------+-------+  PTV       Full                                                      +---------+---------------+---------+-----------+----------+-------+  PERO      Full                                                      +---------+---------------+---------+-----------+----------+-------+   +----+---------------+---------+-----------+----------+-------+  LEFT Compressibility Phasicity Spontaneity Properties Summary  +----+---------------+---------+-----------+----------+-------+  CFV  Full            Yes       Yes                             +----+---------------+---------+-----------+----------+-------+     Summary: Right: There is no evidence of deep vein thrombosis in the lower extremity. No cystic structure found in the popliteal fossa. Left: No evidence of common femoral vein obstruction.  *See table(s) above for measurements and observations.    Preliminary    Vas [place] Upper Extremity Venous Duplex  Result Date: 02/01/2019 UPPER VENOUS STUDY  Indications: Swelling Risk Factors: None identified. Comparison Study: No prior studies. Performing Technologist: Oliver Hum RVT  Examination Guidelines: A complete evaluation includes B-mode imaging, spectral Doppler, color Doppler, and power Doppler as needed of all accessible portions of each vessel. Bilateral testing is considered an integral part of  a complete  examination. Limited examinations for reoccurring indications may be performed as noted.  Right Findings: +----------+------------+---------+-----------+----------+-------+  RIGHT      Compressible Phasicity Spontaneous Properties Summary  +----------+------------+---------+-----------+----------+-------+  IJV            Full        Yes        Yes                         +----------+------------+---------+-----------+----------+-------+  Subclavian     Full        Yes        Yes                         +----------+------------+---------+-----------+----------+-------+  Axillary       Full        Yes        Yes                         +----------+------------+---------+-----------+----------+-------+  Brachial       Full        Yes        Yes                         +----------+------------+---------+-----------+----------+-------+  Radial         Full                                               +----------+------------+---------+-----------+----------+-------+  Ulnar          Full                                               +----------+------------+---------+-----------+----------+-------+  Cephalic       Full                                               +----------+------------+---------+-----------+----------+-------+  Basilic        Full                                               +----------+------------+---------+-----------+----------+-------+  Left Findings: +----------+------------+---------+-----------+----------+-------+  LEFT       Compressible Phasicity Spontaneous Properties Summary  +----------+------------+---------+-----------+----------+-------+  Subclavian     Full        Yes        Yes                         +----------+------------+---------+-----------+----------+-------+  Summary:  Right: No evidence of deep vein thrombosis in the upper extremity. No evidence of superficial vein thrombosis in the upper extremity.  Left: No evidence of thrombosis in the subclavian.  *See table(s) above  for measurements and observations.    Preliminary       LOS: 5 days   Time spent: More than 50% of that time was spent in  counseling and/or coordination of care.  Antonieta Pert, MD [facility]  02/01/2019, 11:51 AM

## 2019-02-01 NOTE — Progress Notes (Signed)
[facility]/Trauma Progress Note      Assessment/Plan HTN HLD H/o MI s/p stent placement x2 2010 on aspirin 325mg  Kidney stones Tobacco abuse  Sigmoid diverticulitis with3.8 cmabscess, admission 06/27 - 4-5 prior episodes of diverticulitis, none that required hospitalization  - Last colonoscopy 2013 by Dr. Hilarie Fredrickson showed mild diverticulosis in the left colon -S/Pdrain placement06/25for the abscess.  -Hopefully she will continue to improve and can plan for elective one-stage surgery later on once her acute flare up has resolved. If she does not improve and requires surgery this admission it will likely be colectomy/colostomy. -Continue drain, IV antibiotics. Will continue to follow.  ID -cipro/flagyl 6/26-06/30; Zosyn 06/30>> VTE -SCDs, sq heparin FEN -FLD, oxy PO for pain Foley -none Follow up -Dr. Leighton Ruff  Plan: improving. FLD, continue IV abx. May need a repeat CT scan this Friday if not greatly improved    LOS: 5 days    Subjective: CC: abdominal pain  Pt is feeling better and pain is improved. She had a BM overnight and is having more flatus. No fever, chills, nausea or vomiting overnight. She wants to go home.   Objective: Vital signs in last 24 hours: Temp:  [98.4 F (36.9 C)-99.3 F (37.4 C)] 98.4 F (36.9 C) (07/02 0439) Pulse Rate:  [69-76] 69 (07/02 0439) Resp:  [14-18] 18 (07/02 0439) BP: (131-140)/(63-73) 135/63 (07/02 0439) SpO2:  [93 %-98 %] 93 % (07/02 0439) Last BM Date: 01/31/19  Intake/Output from previous day: 07/01 0701 - 07/02 0700 In: 1170 [P.O.:1160] Out: 6 [Drains:6] Intake/Output this shift: Total I/O In: 222 [P.O.:222] Out: -   PE: Gen: Alert, NAD, pleasant, cooperative Pulm:Rate andeffort normal Abd: Soft, mild distention, +BS,mild TTP in LLQ without guarding, no peritonitis Skin: no rashes noted, warm and dry Neuro: alert and oriented.  Anti-infectives: Anti-infectives (From  admission, onward)   Start     Dose/Rate Route Frequency Ordered Stop   01/30/19 1200  piperacillin-tazobactam (ZOSYN) IVPB 3.375 g     3.375 g 12.5 mL/hr over 240 Minutes Intravenous Every 8 hours 01/30/19 1045     01/27/19 1015  ciprofloxacin (CIPRO) IVPB 400 mg  Status:  Discontinued     400 mg 200 mL/hr over 60 Minutes Intravenous Every 12 hours 01/27/19 0117 01/30/19 1039   01/27/19 0700  metroNIDAZOLE (FLAGYL) IVPB 500 mg  Status:  Discontinued     500 mg 100 mL/hr over 60 Minutes Intravenous Every 8 hours 01/27/19 0117 01/30/19 1039   01/26/19 2245  ciprofloxacin (CIPRO) IVPB 400 mg     400 mg 200 mL/hr over 60 Minutes Intravenous  Once 01/26/19 2232 01/27/19 0026   01/26/19 2245  metroNIDAZOLE (FLAGYL) IVPB 500 mg     500 mg 100 mL/hr over 60 Minutes Intravenous  Once 01/26/19 2232 01/27/19 0010      Lab Results:  Recent Labs    01/31/19 0630 02/01/19 0527  WBC 15.2* 15.9*  HGB 11.3* 11.0*  HCT 34.8* 33.1*  PLT 269 284   BMET Recent Labs    01/30/19 0224 02/01/19 0527  NA 135 136  K 4.4 3.0*  CL 101 99  CO2 27 26  GLUCOSE 113* 92  BUN 6 <5*  CREATININE 0.60 0.42*  CALCIUM 7.8* 7.7*   PT/INR No results for input(s): LABPROT, INR in the last 72 hours. CMP     Component Value Date/Time   NA 136 02/01/2019 0527   NA 140 01/25/2019 0924   K 3.0 (L) 02/01/2019 6720  CL 99 02/01/2019 0527   CO2 26 02/01/2019 0527   GLUCOSE 92 02/01/2019 0527   BUN <5 (L) 02/01/2019 0527   BUN 8 01/25/2019 0924   CREATININE 0.42 (L) 02/01/2019 0527   CALCIUM 7.7 (L) 02/01/2019 0527   PROT 5.2 (L) 01/30/2019 0224   PROT 6.8 01/25/2019 0924   ALBUMIN 2.1 (L) 01/30/2019 0224   ALBUMIN 4.0 01/25/2019 0924   AST 9 (L) 01/30/2019 0224   ALT 8 01/30/2019 0224   ALKPHOS 66 01/30/2019 0224   BILITOT 0.6 01/30/2019 0224   BILITOT 0.4 01/25/2019 0924   GFRNONAA >60 02/01/2019 0527   GFRAA >60 02/01/2019 0527   Lipase     Component Value Date/Time   LIPASE 26 01/26/2019  2032    Studies/Results: No results found.    Kalman Drape , [facility] 02/01/2019, 8:15 AM  Pager: [phone] Mon-Wed, Friday 7:00am-4:30pm Thurs 7am-11:30am  Consults: [phone]

## 2019-02-01 NOTE — Progress Notes (Signed)
Patient refused CPAP for the night  

## 2019-02-02 ENCOUNTER — Inpatient Hospital Stay (HOSPITAL_COMMUNITY): Payer: No Typology Code available for payment source

## 2019-02-02 LAB — BASIC METABOLIC PANEL
Anion gap: 10 (ref 5–15)
BUN: <5 mg/dL — ABNORMAL LOW (ref 6–20)
CO2: 28 mmol/L (ref 22–32)
Calcium: 7.9 mg/dL — ABNORMAL LOW (ref 8.9–10.3)
Chloride: 97 mmol/L — ABNORMAL LOW (ref 98–111)
Creatinine, Ser: 0.5 mg/dL (ref 0.44–1.00)
GFR calc Af Amer: >60 mL/min (ref >60)
GFR calc non Af Amer: >60 mL/min (ref >60)
Glucose, Bld: 96 mg/dL (ref 70–99)
Potassium: 3.5 mmol/L (ref 3.5–5.1)
Sodium: 135 mmol/L (ref 135–145)

## 2019-02-02 LAB — CBC
HCT: 34.2 % — ABNORMAL LOW (ref 36.0–46.0)
Hemoglobin: 11.5 g/dL — ABNORMAL LOW (ref 12.0–15.0)
MCH: 28.3 pg (ref 26.0–34.0)
MCHC: 33.6 g/dL (ref 30.0–36.0)
MCV: 84.2 fL (ref 80.0–100.0)
Platelets: 296 10*3/uL (ref 150–400)
RBC: 4.06 MIL/uL (ref 3.87–5.11)
RDW: 13.6 % (ref 11.5–15.5)
WBC: 17.4 10*3/uL — ABNORMAL HIGH (ref 4.0–10.5)
nRBC: 0 % (ref 0.0–0.2)

## 2019-02-02 LAB — MAGNESIUM: Magnesium: 1.7 mg/dL (ref 1.7–2.4)

## 2019-02-02 MED ORDER — IOHEXOL 300 MG/ML  SOLN
100.0000 mL | Freq: Once | INTRAMUSCULAR | Status: AC | PRN
  Administered 2019-02-02: 100 mL via INTRAVENOUS

## 2019-02-02 MED ORDER — FLUCONAZOLE IN SODIUM CHLORIDE 400-0.9 MG/200ML-% IV SOLN
400.0000 mg | INTRAVENOUS | Status: AC
  Administered 2019-02-03 – 2019-02-13 (×11): 400 mg via INTRAVENOUS
  Filled 2019-02-02 (×13): qty 200

## 2019-02-02 NOTE — Progress Notes (Signed)
Patient refuses the use of CPAP 

## 2019-02-02 NOTE — Progress Notes (Signed)
Patient's drain has retracted and she is not adequately draining her abscess.  She also has interval progression of her pelvic fluid collection with concern for abscess formation.  She is planning for CT-guided aspiration and drainage of her fluid collections tomorrow (7/4) as schedule allows.  NPO p MN.  Hold thinners.  INR ordered.  RN aware.  Brynda Greathouse, MS RD PA-C

## 2019-02-02 NOTE — Progress Notes (Signed)
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 sae pcp 1 day PTA  For ongoing diarrhea and abdominal pain X5 DAYS-and was diagnosed clinically as diverticulitis and started on Augmentin and Flagyl. Patient says that she did not feel better and in fact her bloating got worse with significant worsening of abdominal pain.   In the ER CT scan of the abdomen pelvis showed acute complicated sigmoid diverticulitis- 3.8 cm abscess adjacent to sigmoid colon. Small to moderate volume of fluid within the pelvis with questionable rim enhancement.  General surgery  CCS was consulted advised IV antibiotics and IR drainage of the abscess and was admitted.  Patient had low-grade fever and nonbloody diarrhea. 7/2: Patient is slowly improving, tolerating diet having bowel movements. 7/3- feels bloated, wbc trended up. Ct planned  Subjective: At bedside chair, feels bloated in abdomen. Also has leg and arm swelling- has been on ivf. No fever, nausea or vomiting. Moved bowel.  Assessment & Plan:  Acute sigmoid diverticulitis with abscess: With 4-5 prior episodes of diverticulitis that did not require hospitalization.  Colonoscopy previously with diverticulosis.  Status post drain placement 6/29 for abscess.  Has worsening wbc counts today and still with abd distension and pain- obtain CT abdomen pelvis today. Followed by surgery, remains on IV Zosyn.  Drain culture growing E. coli, pansensitive, patient's Streptococcus and final report pending.  Drain output is decreasing.  Cont diet as per general surgery appreciate input.   Chronic diastolic CHF: Compensated. Pt retaining fluid but no shortness of breath. Back on HCTZ losartan. Check wt today. I/o shows  +13litre. Can try prn lasix if further wt gain or symptomatic.  Swelling of the right upper extremity/and right lower leg.  Likely from fluid, negative for DVT in duplex. I have resumed  HCTZ 7/1. Cut dnow ivf to 30 ml/hr- can try lasix tomorrow or so.    Anxiety/depression on home Xanax, buspirone, Cymbalta and trazodone.  Resumed home meds. Continue PRN Ativan/Xanax.   Essential hypertension : BP controlled on Metoprolol, HCTZ losartan  CAD with prior stent: Cont asa.  Prediabetes: A1c 5.8.  We will advise her to follow-up with PCP, for now continue on diet control.  HLD:  on statins  WJX:BJYN per respiratory- but refusing during night.  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 Disposition Plan: remains inpatient pending clinical improvement.   Consultants:  CCS  Procedures:  DRAIN PLACEMENT FOR ABSCESS  Antimicrobials: Anti-infectives (From admission, onward)   Start     Dose/Rate Route Frequency Ordered Stop   01/30/19 1200  piperacillin-tazobactam (ZOSYN) IVPB 3.375 g     3.375 g 12.5 mL/hr over 240 Minutes Intravenous Every 8 hours 01/30/19 1045     01/27/19 1015  ciprofloxacin (CIPRO) IVPB 400 mg  Status:  Discontinued     400 mg 200 mL/hr over 60 Minutes Intravenous Every 12 hours 01/27/19 0117 01/30/19 1039   01/27/19 0700  metroNIDAZOLE (FLAGYL) IVPB 500 mg  Status:  Discontinued     500 mg 100 mL/hr over 60 Minutes Intravenous Every 8 hours 01/27/19 0117 01/30/19 1039   01/26/19  2245  ciprofloxacin (CIPRO) IVPB 400 mg     400 mg 200 mL/hr over 60 Minutes Intravenous  Once 01/26/19 2232 01/27/19 0026   01/26/19 2245  metroNIDAZOLE (FLAGYL) IVPB 500 mg     500 mg 100 mL/hr over 60 Minutes Intravenous  Once 01/26/19 2232 01/27/19 0010       Objective: Vitals:   02/01/19 0439 02/01/19 1340  02/01/19 2018 02/02/19 0441  BP: 135/63 134/79 (!) 145/73 114/68  Pulse: 69 66 69 69  Resp: 18 13 16    Temp: 98.4 F (36.9 C) 98.7 F (37.1 C) 98.5 F (36.9 C) 98.6 F (37 C)  TempSrc: Oral Oral Oral Oral  SpO2: 93% 95% 97% 96%  Weight:      Height:        Intake/Output Summary (Last 24 hours) at 02/02/2019 0832 Last data filed at 02/02/2019 7408 Gross per 24 hour  Intake 3037.83 ml  Output 10 ml  Net 3027.83 ml   Filed Weights   01/27/19 0138 01/28/19 1413  Weight: 92.3 kg 99.3 kg   Weight change:   Body mass index is 38.78 kg/m.  Intake/Output from previous day: 07/02 0701 - 07/03 0700 In: 2959.8 [P.O.:1632; I.V.:1317.8] Out: 10 [Drains:10] Intake/Output this shift: Total I/O In: 300 [P.O.:300] Out: -   Examination: General exam: Calm, comfortable, not in acute distress, older for age, obese. HEENT:Oral mucosa moist, Ear/Nose WNL grossly, dentition normal. Respiratory system: Bilateral equal air entry, no crackles and wheezing, no use of accessory muscle, non tender on palpation. Cardiovascular system: regular rate and rhythm, S1 & S2 heard, No JVD/murmurs. Gastrointestinal system: Abdomen soft, distended, tender. BS + Nervous System:Alert, awake and oriented at baseline. Able to move UE and LE, sensation intact. Extremities:  UE/LE edema, distal peripheral pulses palpable.  Skin: No rashes,no icterus. MSK: Normal muscle bulk,tone, power  Medications:  Scheduled Meds:  aspirin EC  325 mg Oral Daily   busPIRone  15 mg Oral TID   docusate sodium  100 mg Oral Daily   DULoxetine  60 mg Oral Daily   guaiFENesin  600 mg Oral BID   losartan  100 mg Oral Daily   And   hydrochlorothiazide  25 mg Oral Daily   metoprolol tartrate  25 mg Oral BID   rosuvastatin  10 mg Oral Daily   sodium chloride flush  5 mL Intracatheter Q8H   Continuous Infusions:  sodium chloride 50 mL/hr at 02/02/19 0639   piperacillin-tazobactam (ZOSYN)  IV 3.375 g (02/02/19 0419)     Data Reviewed: I have personally reviewed following labs and imaging studies  CBC: Recent Labs  Lab 01/26/19 2032  01/28/19 0542 01/29/19 0557 01/30/19 0224 01/31/19 0630 02/01/19 0527 02/02/19 0445  WBC 11.5*   < > 17.0* 19.1* 16.9* 15.2* 15.9* 17.4*  NEUTROABS 10.2*  --  14.6* 16.7* 14.2*  --   --   --   HGB 14.1   < > 12.7 11.4* 11.5* 11.3* 11.0* 11.5*  HCT 41.9   < > 40.7 34.3* 35.6* 34.8* 33.1* 34.2*  MCV 84.8   < > 88.7 85.8 86.4 85.7 84.0 84.2  PLT 214   < > 249 240 271 269 284 296   < > = values in this interval not displayed.   Basic Metabolic Panel: Recent Labs  Lab 01/28/19 0542 01/29/19 0557 01/30/19 0224 01/31/19 0630 02/01/19 0527 02/02/19 0445  NA 134* 132* 135  --  136 135  K 3.1* 4.3 4.4  --  3.0*  3.5  CL 100 99 101  --  99 97*  CO2 26 26 27   --  26 28  GLUCOSE 95 110* 113*  --  92 96  BUN 10 6 6   --  <5* <5*  CREATININE 0.65 0.70 0.60  --  0.42* 0.50  CALCIUM 7.7* 7.6* 7.8*  --  7.7* 7.9*  MG 2.1 1.9 2.0 2.2 1.9 1.7   GFR: Estimated Creatinine Clearance: 86.2 mL/min (by C-G formula based on SCr of 0.5 mg/dL). Liver Function Tests: Recent Labs  Lab 01/26/19 2032 01/27/19 0718 01/28/19 0542 01/29/19 0557 01/30/19 0224  AST 14* 10* 12* 10* 9*  ALT 14 12 11 9 8   ALKPHOS 69 53 63 69 66  BILITOT 0.6 0.6 0.4 0.2* 0.6  PROT 7.4 5.9* 6.3* 5.5* 5.2*  ALBUMIN 3.5 2.8* 3.0* 2.2* 2.1*   Recent Labs  Lab 01/26/19 2032  LIPASE 26   No results for input(s): AMMONIA in the last 168 hours. Coagulation Profile: Recent Labs  Lab 01/26/19 2032 01/28/19 0542  INR 1.0 1.3*   Cardiac Enzymes: No results for input(s): CKTOTAL, CKMB, CKMBINDEX, TROPONINI in the last 168 hours. BNP (last 3 results) No results for input(s): PROBNP in the last 8760 hours. HbA1C: No results for input(s): HGBA1C in the last 72 hours. CBG: No results for input(s): GLUCAP in the last 168 hours. Lipid Profile: No results for input(s): CHOL, HDL, LDLCALC, TRIG,  CHOLHDL, LDLDIRECT in the last 72 hours. Thyroid Function Tests: No results for input(s): TSH, T4TOTAL, FREET4, T3FREE, THYROIDAB in the last 72 hours. Anemia Panel: No results for input(s): VITAMINB12, FOLATE, FERRITIN, TIBC, IRON, RETICCTPCT in the last 72 hours. Sepsis Labs: Recent Labs  Lab 01/26/19 2033 01/26/19 2240  LATICACIDVEN 1.0 1.0    Recent Results (from the past 240 hour(s))  SARS Coronavirus 2 (CEPHEID - Performed in [facility] hospital lab), Hosp Order     Status: None   Collection Time: 01/26/19 10:40 PM   Specimen: Nasopharyngeal Swab  Result Value Ref Range Status   SARS Coronavirus 2 NEGATIVE NEGATIVE Final    Comment: (NOTE) If result is NEGATIVE SARS-CoV-2 target nucleic acids are NOT DETECTED. The SARS-CoV-2 RNA is generally detectable in upper and lower  respiratory specimens during the acute phase of infection. The lowest  concentration of SARS-CoV-2 viral copies this assay can detect is 250  copies / mL. A negative result does not preclude SARS-CoV-2 infection  and should not be used as the sole basis for treatment or other  patient management decisions.  A negative result may occur with  improper specimen collection / handling, submission of specimen other  than nasopharyngeal swab, presence of viral mutation(s) within the  areas targeted by this assay, and inadequate number of viral copies  (<250 copies / mL). A negative result must be combined with clinical  observations, patient history, and epidemiological information. If result is POSITIVE SARS-CoV-2 target nucleic acids are DETECTED. The SARS-CoV-2 RNA is generally detectable in upper and lower  respiratory specimens dur ing the acute phase of infection.  Positive  results are indicative of active infection with SARS-CoV-2.  Clinical  correlation with patient history and other diagnostic information is  necessary to determine patient infection status.  Positive results do  not rule out  bacterial infection or co-infection with other viruses. If result is PRESUMPTIVE POSTIVE SARS-CoV-2 nucleic acids MAY BE PRESENT.   A presumptive positive result was obtained on the submitted specimen  and confirmed on repeat testing.  While 2019 novel coronavirus  (SARS-CoV-2) nucleic acids may be present in the submitted sample  additional confirmatory testing may be necessary for epidemiological  and / or clinical management purposes  to differentiate between  SARS-CoV-2 and other Sarbecovirus currently known to infect humans.  If clinically indicated additional testing with an alternate test  methodology [phone]) is advised. The SARS-CoV-2 RNA is generally  detectable in upper and lower respiratory sp ecimens during the acute  phase of infection. The expected result is Negative. Fact Sheet for Patients:  StrictlyIdeas.no Fact Sheet for Healthcare Providers: BankingDealers.co.za This test is not yet approved or cleared by the [place] FDA and has been authorized for detection and/or diagnosis of SARS-CoV-2 by FDA under an Emergency Use Authorization (EUA).  This EUA will remain in effect (meaning this test can be used) for the duration of the COVID-19 declaration under Section 564(b)(1) of the Act, 21 U.S.C. section 360bbb-3(b)(1), unless the authorization is terminated or revoked sooner. Performed at [facility], [address], [place], [place] [address]   Aerobic/Anaerobic Culture (surgical/deep wound)     Status: None (Preliminary result)   Collection Time: 01/29/19  2:19 PM   Specimen: Abscess  Result Value Ref Range Status   Specimen Description ABSCESS JP DRAINAGE  Final   Special Requests NONE  Final   Gram Stain   Final    ABUNDANT WBC PRESENT, PREDOMINANTLY PMN ABUNDANT GRAM POSITIVE COCCI Performed at [facility] [address] [place], [place] [address]    Culture   Final    FEW ESCHERICHIA  COLI MODERATE VIRIDANS STREPTOCOCCUS FEW CANDIDA ALBICANS NO ANAEROBES ISOLATED; CULTURE IN PROGRESS FOR 5 DAYS    Report Status PENDING  Incomplete   Organism ID, Bacteria ESCHERICHIA COLI  Final      Susceptibility   Escherichia coli - MIC*    AMPICILLIN <=2 SENSITIVE Sensitive     CEFAZOLIN <=4 SENSITIVE Sensitive     CEFEPIME <=1 SENSITIVE Sensitive     CEFTAZIDIME <=1 SENSITIVE Sensitive     CEFTRIAXONE <=1 SENSITIVE Sensitive     CIPROFLOXACIN <=0.25 SENSITIVE Sensitive     GENTAMICIN <=1 SENSITIVE Sensitive     IMIPENEM <=0.25 SENSITIVE Sensitive     TRIMETH/SULFA <=20 SENSITIVE Sensitive     AMPICILLIN/SULBACTAM <=2 SENSITIVE Sensitive     PIP/TAZO <=4 SENSITIVE Sensitive     Extended ESBL NEGATIVE Sensitive     * FEW ESCHERICHIA COLI      Radiology Studies: Vas [place] Lower Extremity Venous (dvt)  Result Date: 02/01/2019  Lower Venous Study Indications: Swelling.  Risk Factors: None identified. Comparison Study: No prior studies. Performing Technologist: Oliver Hum RVT  Examination Guidelines: A complete evaluation includes B-mode imaging, spectral Doppler, color Doppler, and power Doppler as needed of all accessible portions of each vessel. Bilateral testing is considered an integral part of a complete examination. Limited examinations for reoccurring indications may be performed as noted.  +---------+---------------+---------+-----------+----------+-------+  RIGHT     Compressibility Phasicity Spontaneity Properties Summary  +---------+---------------+---------+-----------+----------+-------+  CFV       Full            Yes       Yes                             +---------+---------------+---------+-----------+----------+-------+  SFJ       Full                                                      +---------+---------------+---------+-----------+----------+-------+  FV Prox   Full                                                       +---------+---------------+---------+-----------+----------+-------+  FV Mid    Full                                                      +---------+---------------+---------+-----------+----------+-------+  FV Distal Full                                                      +---------+---------------+---------+-----------+----------+-------+  PFV       Full                                                      +---------+---------------+---------+-----------+----------+-------+  POP       Full            Yes       Yes                             +---------+---------------+---------+-----------+----------+-------+  PTV       Full                                                      +---------+---------------+---------+-----------+----------+-------+  PERO      Full                                                      +---------+---------------+---------+-----------+----------+-------+   +----+---------------+---------+-----------+----------+-------+  LEFT Compressibility Phasicity Spontaneity Properties Summary  +----+---------------+---------+-----------+----------+-------+  CFV  Full            Yes       Yes                             +----+---------------+---------+-----------+----------+-------+     Summary: Right: There is no evidence of deep vein thrombosis in the lower extremity. No cystic structure found in the popliteal fossa. Left: No evidence of common femoral vein obstruction.  *See table(s) above for measurements and observations. Electronically signed by Monica Martinez MD on 02/01/2019 at 2:20:18 PM.    Final    Vas [place] Upper Extremity Venous Duplex  Result Date: 02/01/2019 UPPER VENOUS STUDY  Indications: Swelling Risk Factors: None identified. Comparison Study: No prior studies. Performing Technologist: Oliver Hum RVT  Examination Guidelines: A complete evaluation includes B-mode imaging, spectral Doppler, color Doppler, and power Doppler as needed of all accessible portions  of each vessel.  Bilateral testing is considered an integral part of a complete examination. Limited examinations for reoccurring indications may be performed as noted.  Right Findings: +----------+------------+---------+-----------+----------+-------+  RIGHT      Compressible Phasicity Spontaneous Properties Summary  +----------+------------+---------+-----------+----------+-------+  IJV            Full        Yes        Yes                         +----------+------------+---------+-----------+----------+-------+  Subclavian     Full        Yes        Yes                         +----------+------------+---------+-----------+----------+-------+  Axillary       Full        Yes        Yes                         +----------+------------+---------+-----------+----------+-------+  Brachial       Full        Yes        Yes                         +----------+------------+---------+-----------+----------+-------+  Radial         Full                                               +----------+------------+---------+-----------+----------+-------+  Ulnar          Full                                               +----------+------------+---------+-----------+----------+-------+  Cephalic       Full                                               +----------+------------+---------+-----------+----------+-------+  Basilic        Full                                               +----------+------------+---------+-----------+----------+-------+  Left Findings: +----------+------------+---------+-----------+----------+-------+  LEFT       Compressible Phasicity Spontaneous Properties Summary  +----------+------------+---------+-----------+----------+-------+  Subclavian     Full        Yes        Yes                         +----------+------------+---------+-----------+----------+-------+  Summary:  Right: No evidence of deep vein thrombosis in the upper extremity. No evidence of superficial vein thrombosis in the upper extremity.  Left: No  evidence of thrombosis in the subclavian.  *See table(s) above for measurements and observations.  Diagnosing physician: Monica Martinez MD Electronically signed by Monica Martinez MD on 02/01/2019 at  2:19:58 PM.    Final       LOS: 6 days   Time spent: More than 50% of that time was spent in counseling and/or coordination of care.  Antonieta Pert, MD [facility]  02/02/2019, 8:32 AM

## 2019-02-02 NOTE — Progress Notes (Signed)
Pharmacy Antibiotic Note  Heather Lam is a 58 y.o. female admitted on 01/26/2019 with sigmoid diverticulitis with abscess . Abscess is growing pan-S e.coli, strep viridans, and candida albicans. D/w Dr. Lupita Leash, will continue zosyn (Day 7) for now due to lack of improvement and add fluconazole to cover candida.   Plan: Continue Zosyn 3.375gm IV q8h Start fluconazole 400mg  IV q24h   Height: 5\' 3"  (160 cm) Weight: 218 lb 14.7 oz (99.3 kg) IBW/kg (Calculated) : 52.4  Temp (24hrs), Avg:98.6 F (37 C), Min:98.5 F (36.9 C), Max:98.6 F (37 C)  Recent Labs  Lab 01/26/19 2033 01/26/19 2240  01/28/19 0542 01/29/19 0557 01/30/19 0224 01/31/19 0630 02/01/19 0527 02/02/19 0445  WBC  --   --    < > 17.0* 19.1* 16.9* 15.2* 15.9* 17.4*  CREATININE  --   --    < > 0.65 0.70 0.60  --  0.42* 0.50  LATICACIDVEN 1.0 1.0  --   --   --   --   --   --   --    < > = values in this interval not displayed.    Estimated Creatinine Clearance: 86.2 mL/min (by C-G formula based on SCr of 0.5 mg/dL).    No Known Allergies  Antimicrobials this admission:  6/26 Cipro >> 6/30 6/26 Flagyl >> 6/30 6/30 Zosyn >>   Dose adjustments this admission:  --  Microbiology results:  6/26 COVID: negative  6/29 abscess, JP drainage: Pan-S e.coli, strep viridans, and candida albicans   Heather Lam A. Levada Dy, PharmD, [place] Please utilize Amion for appropriate phone number to reach the unit pharmacist ([place])   02/02/2019 2:02 PM

## 2019-02-03 ENCOUNTER — Inpatient Hospital Stay (HOSPITAL_COMMUNITY): Payer: No Typology Code available for payment source

## 2019-02-03 DIAGNOSIS — K5720 Diverticulitis of large intestine with perforation and abscess without bleeding: Principal | ICD-10-CM

## 2019-02-03 DIAGNOSIS — E785 Hyperlipidemia, unspecified: Secondary | ICD-10-CM

## 2019-02-03 LAB — PROTIME-INR
INR: 1.2 (ref 0.8–1.2)
Prothrombin Time: 15 seconds (ref 11.4–15.2)

## 2019-02-03 LAB — AEROBIC/ANAEROBIC CULTURE W GRAM STAIN (SURGICAL/DEEP WOUND)

## 2019-02-03 LAB — BASIC METABOLIC PANEL
Anion gap: 10 (ref 5–15)
BUN: <5 mg/dL — ABNORMAL LOW (ref 6–20)
CO2: 27 mmol/L (ref 22–32)
Calcium: 7.9 mg/dL — ABNORMAL LOW (ref 8.9–10.3)
Chloride: 94 mmol/L — ABNORMAL LOW (ref 98–111)
Creatinine, Ser: 0.5 mg/dL (ref 0.44–1.00)
GFR calc Af Amer: >60 mL/min (ref >60)
GFR calc non Af Amer: >60 mL/min (ref >60)
Glucose, Bld: 83 mg/dL (ref 70–99)
Potassium: 3.1 mmol/L — ABNORMAL LOW (ref 3.5–5.1)
Sodium: 131 mmol/L — ABNORMAL LOW (ref 135–145)

## 2019-02-03 LAB — CBC
HCT: 33.1 % — ABNORMAL LOW (ref 36.0–46.0)
Hemoglobin: 11 g/dL — ABNORMAL LOW (ref 12.0–15.0)
MCH: 27.9 pg (ref 26.0–34.0)
MCHC: 33.2 g/dL (ref 30.0–36.0)
MCV: 84 fL (ref 80.0–100.0)
Platelets: 290 10*3/uL (ref 150–400)
RBC: 3.94 MIL/uL (ref 3.87–5.11)
RDW: 13.5 % (ref 11.5–15.5)
WBC: 18 10*3/uL — ABNORMAL HIGH (ref 4.0–10.5)
nRBC: 0 % (ref 0.0–0.2)

## 2019-02-03 LAB — MAGNESIUM: Magnesium: 1.7 mg/dL (ref 1.7–2.4)

## 2019-02-03 MED ORDER — FENTANYL CITRATE (PF) 100 MCG/2ML IJ SOLN
INTRAMUSCULAR | Status: AC | PRN
  Administered 2019-02-03 (×2): 50 ug via INTRAVENOUS

## 2019-02-03 MED ORDER — SODIUM CHLORIDE 0.9 % IV SOLN
INTRAVENOUS | Status: AC | PRN
  Administered 2019-02-03: 10 mL/h via INTRAVENOUS

## 2019-02-03 MED ORDER — MIDAZOLAM HCL 2 MG/2ML IJ SOLN
INTRAMUSCULAR | Status: AC | PRN
  Administered 2019-02-03 (×2): 1 mg via INTRAVENOUS

## 2019-02-03 MED ORDER — POTASSIUM CHLORIDE CRYS ER 20 MEQ PO TBCR
40.0000 meq | EXTENDED_RELEASE_TABLET | Freq: Once | ORAL | Status: AC
  Administered 2019-02-03: 40 meq via ORAL
  Filled 2019-02-03: qty 2

## 2019-02-03 MED ORDER — FENTANYL CITRATE (PF) 100 MCG/2ML IJ SOLN
INTRAMUSCULAR | Status: AC
  Filled 2019-02-03: qty 4

## 2019-02-03 MED ORDER — MIDAZOLAM HCL 2 MG/2ML IJ SOLN
INTRAMUSCULAR | Status: AC
  Filled 2019-02-03: qty 4

## 2019-02-03 MED ORDER — SODIUM CHLORIDE 0.9% FLUSH
5.0000 mL | Freq: Three times a day (TID) | INTRAVENOUS | Status: DC
  Administered 2019-02-03 – 2019-02-09 (×17): 5 mL

## 2019-02-03 MED ORDER — LIDOCAINE HCL 1 % IJ SOLN
INTRAMUSCULAR | Status: AC
  Filled 2019-02-03: qty 20

## 2019-02-03 NOTE — Sedation Documentation (Signed)
This was 2 procedures per MD there were 2 Tos altough writer cannot see charting past first departure. Writer has never left pt and continued to monitor closely. VS have been assessed q 52min and pt has been stable and calm

## 2019-02-03 NOTE — Plan of Care (Signed)

## 2019-02-03 NOTE — Progress Notes (Signed)
Spoke with patient's daughter Joellen Jersey and provided an update.

## 2019-02-03 NOTE — Plan of Care (Signed)
  Problem: Clinical Measurements: Goal: Ability to maintain clinical measurements within normal limits will improve Outcome: Progressing   Problem: Coping: Goal: Level of anxiety will decrease Outcome: Progressing   Problem: Elimination: Goal: Will not experience complications related to bowel motility Outcome: Progressing Goal: Will not experience complications related to urinary retention Outcome: Progressing   Problem: Pain Managment: Goal: General experience of comfort will improve Outcome: Progressing

## 2019-02-03 NOTE — Progress Notes (Signed)
Patient refused CPAP for the night  

## 2019-02-03 NOTE — Procedures (Signed)
Diverticular abscesses  S/p CT drains (pelvic and LLQ)  No comp Stable ebl min cxs sent Full report in pacs

## 2019-02-03 NOTE — Progress Notes (Signed)
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 sae pcp 1 day PTA  For ongoing diarrhea and abdominal pain X5 DAYS-and was diagnosed clinically as diverticulitis and started on Augmentin and Flagyl. Patient says that she did not feel better and in fact her bloating got worse with significant worsening of abdominal pain.   In the ER CT scan of the abdomen pelvis showed acute complicated sigmoid diverticulitis- 3.8 cm abscess adjacent to sigmoid colon. Small to moderate volume of fluid within the pelvis with questionable rim enhancement.  General surgery  CCS was consulted advised IV antibiotics and IR drainage of the abscess and was admitted.  Patient had low-grade fever and nonbloody diarrhea. 7/2: Patient is slowly improving, tolerating diet having bowel movements. 7/3- feels bloated, wbc trended up. Ct planned 7/4: Standing leukocytosis, IR guided CT drain placement again  Subjective: Seen postprocedure, complains of worsening pain on the left lower quadrant.  Drain was replaced this morning.  No fever but WBC went up.  Passing flatus.  No nausea vomiting.  Assessment & Plan:  Acute sigmoid diverticulitis with abscess: With 4-5 prior episodes of diverticulitis that did not require hospitalization.  Colonoscopy previously with diverticulosis.  Status post drain placement 6/29 for abscess.  WBC was trending up, repeat CT 7/3 persistent acute left lower quadrant sigmoid diverticulitis with 2 pelvic abscesses, left lower quadrant abscess drain was retracted into the pericolonic fat.  Underwent replacement of her left lower quadrant drain 7/4.  Continue on IV Zosyn for now, fluconazole added 7/3.Drain culture growing pansensitive E. Coli/  viridan strep and candida. Cont diet as per general surgery appreciate input.   Chronic diastolic CHF: Compensated. Pt retaining fluid but no shortness of breath. Back on HCTZ losartan.  Monitor weight. I/o shows +13litre. Can try prn lasix if further wt gain or symptomatic.  Swelling of the right upper extremity/and right lower leg.  Likely from fluid, negative for DVT in duplex. I have resumed  HCTZ 7/1. Cut dnow ivf to 30 ml/hr-monitor.    Anxiety/depression on home Xanax, buspirone, Cymbalta and trazodone.  Resumed home meds. Continue PRN Ativan/Xanax.   Essential hypertension : BP well controlled on Metoprolol, HCTZ losartan  CAD with prior stent: Cont asa.  Prediabetes: A1c 5.8.  We will advise her to follow-up with PCP, for now continue on diet control.  HLD:  on statins  RJJ:OACZ per respiratory- but refusing during night.  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 Disposition Plan: remains inpatient pending clinical improvement.   Consultants:  CCS  Procedures:  DRAIN PLACEMENT FOR ABSCESS  Antimicrobials: Anti-infectives (From admission, onward)   Start     Dose/Rate Route Frequency Ordered Stop   02/02/19 1415  fluconazole (DIFLUCAN) IVPB 400 mg     400 mg 100 mL/hr over 120 Minutes Intravenous Every 24 hours 02/02/19 1408     01/30/19 1200  piperacillin-tazobactam (ZOSYN) IVPB 3.375 g     3.375 g 12.5 mL/hr over 240 Minutes Intravenous Every 8 hours 01/30/19 1045     01/27/19 1015  ciprofloxacin (CIPRO) IVPB 400  mg  Status:  Discontinued     400 mg 200 mL/hr over 60 Minutes Intravenous Every 12 hours 01/27/19 0117 01/30/19 1039   01/27/19 0700  metroNIDAZOLE (FLAGYL) IVPB 500 mg  Status:  Discontinued     500 mg 100 mL/hr over 60 Minutes Intravenous Every 8 hours 01/27/19  0117 01/30/19 1039   01/26/19 2245  ciprofloxacin (CIPRO) IVPB 400 mg     400 mg 200 mL/hr over 60 Minutes Intravenous  Once 01/26/19 2232 01/27/19 0026   01/26/19 2245  metroNIDAZOLE (FLAGYL) IVPB 500 mg     500 mg 100 mL/hr over 60 Minutes Intravenous  Once 01/26/19 2232 01/27/19 0010       Objective: Vitals:   02/03/19 0955 02/03/19 1000 02/03/19 1013 02/03/19 1046  BP: 134/67  129/66 135/69  Pulse: 67 69 66 67  Resp:    19  Temp:    98.4 F (36.9 C)  TempSrc:    Oral  SpO2: 100% 99% 98% 93%  Weight:      Height:        Intake/Output Summary (Last 24 hours) at 02/03/2019 1111 Last data filed at 02/02/2019 2035 Gross per 24 hour  Intake 1074.91 ml  Output 0 ml  Net 1074.91 ml   Filed Weights   01/27/19 0138 01/28/19 1413  Weight: 92.3 kg 99.3 kg   Weight change:   Body mass index is 38.78 kg/m.  Intake/Output from previous day: 07/03 0701 - 07/04 0700 In: 1374.9 [P.O.:600; I.V.:324.9; IV Piggyback:450] Out: 0  Intake/Output this shift: No intake/output data recorded.  Examination: General exam: Alert awake oriented, in mild pain.  Obese. HEENT:Oral mucosa moist, Ear/Nose WNL grossly, dentition normal. Respiratory system: Bilateral clear air entry no wheezing or crackles.   Cardiovascular system: regular rate and rhythm, S1 & S2 heard, No JVD/murmurs. Gastrointestinal system: Abdomen soft, distended/obese, tender in the left lower quadrant. Nervous System:Alert, awake and oriented at baseline. Able to move UE and LE, sensation intact. Extremities:  UE/LE edema, distal peripheral pulses palpable.  Skin: No rashes,no icterus. MSK: Normal muscle bulk,tone, power  Medications:  Scheduled Meds:  aspirin EC  325 mg Oral Daily   busPIRone  15 mg Oral TID   docusate sodium  100 mg Oral Daily   DULoxetine  60 mg Oral Daily   fentaNYL       guaiFENesin  600 mg Oral BID   losartan  100 mg Oral Daily   And   hydrochlorothiazide  25 mg Oral Daily    lidocaine       metoprolol tartrate  25 mg Oral BID   midazolam       rosuvastatin  10 mg Oral Daily   sodium chloride flush  5 mL Intracatheter Q8H   sodium chloride flush  5 mL Intracatheter Q8H   Continuous Infusions:  sodium chloride 30 mL/hr at 02/02/19 1936   fluconazole (DIFLUCAN) IV     piperacillin-tazobactam (ZOSYN)  IV 3.375 g (02/03/19 0454)    Data Reviewed: I have personally reviewed following labs and imaging studies  CBC: Recent Labs  Lab 01/28/19 0542 01/29/19 0557 01/30/19 0224 01/31/19 0630 02/01/19 0527 02/02/19 0445 02/03/19 0117  WBC 17.0* 19.1* 16.9* 15.2* 15.9* 17.4* 18.0*  NEUTROABS 14.6* 16.7* 14.2*  --   --   --   --   HGB 12.7 11.4* 11.5* 11.3* 11.0* 11.5* 11.0*  HCT 40.7 34.3* 35.6* 34.8* 33.1* 34.2* 33.1*  MCV 88.7 85.8 86.4 85.7 84.0 84.2 84.0  PLT  249 240 271 269 284 296 749   Basic Metabolic Panel: Recent Labs  Lab 01/29/19 0557 01/30/19 0224 01/31/19 0630 02/01/19 0527 02/02/19 0445 02/03/19 0117  NA 132* 135  --  136 135 131*  K 4.3 4.4  --  3.0* 3.5 3.1*  CL 99 101  --  99 97* 94*  CO2 26 27  --  26 28 27   GLUCOSE 110* 113*  --  92 96 83  BUN 6 6  --  <5* <5* <5*  CREATININE 0.70 0.60  --  0.42* 0.50 0.50  CALCIUM 7.6* 7.8*  --  7.7* 7.9* 7.9*  MG 1.9 2.0 2.2 1.9 1.7 1.7   GFR: Estimated Creatinine Clearance: 86.2 mL/min (by C-G formula based on SCr of 0.5 mg/dL). Liver Function Tests: Recent Labs  Lab 01/28/19 0542 01/29/19 0557 01/30/19 0224  AST 12* 10* 9*  ALT 11 9 8   ALKPHOS 63 69 66  BILITOT 0.4 0.2* 0.6  PROT 6.3* 5.5* 5.2*  ALBUMIN 3.0* 2.2* 2.1*   No results for input(s): LIPASE, AMYLASE in the last 168 hours. No results for input(s): AMMONIA in the last 168 hours. Coagulation Profile: Recent Labs  Lab 01/28/19 0542 02/03/19 0117  INR 1.3* 1.2   Cardiac Enzymes: No results for input(s): CKTOTAL, CKMB, CKMBINDEX, TROPONINI in the last 168 hours. BNP (last 3 results) No results for  input(s): PROBNP in the last 8760 hours. HbA1C: No results for input(s): HGBA1C in the last 72 hours. CBG: No results for input(s): GLUCAP in the last 168 hours. Lipid Profile: No results for input(s): CHOL, HDL, LDLCALC, TRIG, CHOLHDL, LDLDIRECT in the last 72 hours. Thyroid Function Tests: No results for input(s): TSH, T4TOTAL, FREET4, T3FREE, THYROIDAB in the last 72 hours. Anemia Panel: No results for input(s): VITAMINB12, FOLATE, FERRITIN, TIBC, IRON, RETICCTPCT in the last 72 hours. Sepsis Labs: No results for input(s): PROCALCITON, LATICACIDVEN in the last 168 hours.  Recent Results (from the past 240 hour(s))  SARS Coronavirus 2 (CEPHEID - Performed in [place] hospital lab), Hosp Order     Status: None   Collection Time: 01/26/19 10:40 PM   Specimen: Nasopharyngeal Swab  Result Value Ref Range Status   SARS Coronavirus 2 NEGATIVE NEGATIVE Final    Comment: (NOTE) If result is NEGATIVE SARS-CoV-2 target nucleic acids are NOT DETECTED. The SARS-CoV-2 RNA is generally detectable in upper and lower  respiratory specimens during the acute phase of infection. The lowest  concentration of SARS-CoV-2 viral copies this assay can detect is 250  copies / mL. A negative result does not preclude SARS-CoV-2 infection  and should not be used as the sole basis for treatment or other  patient management decisions.  A negative result may occur with  improper specimen collection / handling, submission of specimen other  than nasopharyngeal swab, presence of viral mutation(s) within the  areas targeted by this assay, and inadequate number of viral copies  (<250 copies / mL). A negative result must be combined with clinical  observations, patient history, and epidemiological information. If result is POSITIVE SARS-CoV-2 target nucleic acids are DETECTED. The SARS-CoV-2 RNA is generally detectable in upper and lower  respiratory specimens dur ing the acute phase of infection.  Positive    results are indicative of active infection with SARS-CoV-2.  Clinical  correlation with patient history and other diagnostic information is  necessary to determine patient infection status.  Positive results do  not rule out bacterial infection or co-infection with other viruses.  If result is PRESUMPTIVE POSTIVE SARS-CoV-2 nucleic acids MAY BE PRESENT.   A presumptive positive result was obtained on the submitted specimen  and confirmed on repeat testing.  While 2019 novel coronavirus  (SARS-CoV-2) nucleic acids may be present in the submitted sample  additional confirmatory testing may be necessary for epidemiological  and / or clinical management purposes  to differentiate between  SARS-CoV-2 and other Sarbecovirus currently known to infect humans.  If clinically indicated additional testing with an alternate test  methodology [phone]) is advised. The SARS-CoV-2 RNA is generally  detectable in upper and lower respiratory sp ecimens during the acute  phase of infection. The expected result is Negative. Fact Sheet for Patients:  StrictlyIdeas.no Fact Sheet for Healthcare Providers: BankingDealers.co.za This test is not yet approved or cleared by the [place] FDA and has been authorized for detection and/or diagnosis of SARS-CoV-2 by FDA under an Emergency Use Authorization (EUA).  This EUA will remain in effect (meaning this test can be used) for the duration of the COVID-19 declaration under Section 564(b)(1) of the Act, 21 U.S.C. section 360bbb-3(b)(1), unless the authorization is terminated or revoked sooner. Performed at [facility], [address], [place], [place] [address]   Aerobic/Anaerobic Culture (surgical/deep wound)     Status: None (Preliminary result)   Collection Time: 01/29/19  2:19 PM   Specimen: Abscess  Result Value Ref Range Status   Specimen Description ABSCESS JP DRAINAGE  Final   Special Requests NONE   Final   Gram Stain   Final    ABUNDANT WBC PRESENT, PREDOMINANTLY PMN ABUNDANT GRAM POSITIVE COCCI Performed at [facility] [address], [place], [place] [address]    Culture   Final    FEW ESCHERICHIA COLI MODERATE VIRIDANS STREPTOCOCCUS FEW CANDIDA ALBICANS NO ANAEROBES ISOLATED; CULTURE IN PROGRESS FOR 5 DAYS    Report Status PENDING  Incomplete   Organism ID, Bacteria ESCHERICHIA COLI  Final      Susceptibility   Escherichia coli - MIC*    AMPICILLIN <=2 SENSITIVE Sensitive     CEFAZOLIN <=4 SENSITIVE Sensitive     CEFEPIME <=1 SENSITIVE Sensitive     CEFTAZIDIME <=1 SENSITIVE Sensitive     CEFTRIAXONE <=1 SENSITIVE Sensitive     CIPROFLOXACIN <=0.25 SENSITIVE Sensitive     GENTAMICIN <=1 SENSITIVE Sensitive     IMIPENEM <=0.25 SENSITIVE Sensitive     TRIMETH/SULFA <=20 SENSITIVE Sensitive     AMPICILLIN/SULBACTAM <=2 SENSITIVE Sensitive     PIP/TAZO <=4 SENSITIVE Sensitive     Extended ESBL NEGATIVE Sensitive     * FEW ESCHERICHIA COLI      Radiology Studies: Ct Abdomen Pelvis W Contrast  Result Date: 02/02/2019 CLINICAL DATA:  Diverticulitis with abscess status post drain 01/29/2019. Increasing white count. Persistent abdominal pain EXAM: CT ABDOMEN AND PELVIS WITH CONTRAST TECHNIQUE: Multidetector CT imaging of the abdomen and pelvis was performed using the standard protocol following bolus administration of intravenous contrast. CONTRAST:  112mL OMNIPAQUE IOHEXOL 300 MG/ML  SOLN COMPARISON:  None. FINDINGS: Lower chest: Increased small non loculated right effusion with right base compressive atelectasis. Normal heart size. Aorta atherosclerotic. Degenerative changes of lower thoracic spine. Hepatobiliary: No focal liver abnormality is seen. No gallstones, gallbladder wall thickening, or biliary dilatation. Pancreas: Unremarkable. No pancreatic ductal dilatation or surrounding inflammatory changes. Spleen: Normal in size without focal abnormality. Adrenals/Urinary  Tract: Normal adrenal glands. Kidneys demonstrate no acute hydronephrosis, obstruction pattern, hydroureter, ureteral calculus or definite bladder abnormality. Bladder nondistended.  Stable bilateral renal cysts, 1 with peripheral calcification in the left hilar region. Similar punctate nonobstructing right lower pole nephrolithiasis. Stomach/Bowel: Negative for bowel obstruction, significant dilatation, ileus, or free air. In the pelvis, the left lower quadrant drain has retracted into the pericolonic fat. There remains a pericolonic sigmoid diverticular abscess measuring 3.5 x 4.8 cm with air-fluid level. There is also dependent pelvic fluid with peripheral enhancement draped around the uterus measuring up to 9.5 x 12.2 cm, also suspicious for abscess. Similar acute diverticulitis in the left lower quadrant sigmoid colon with surrounding inflammation and wall thickening. Vascular/Lymphatic: Extensive aortic atherosclerosis. Negative for aneurysm. No retroperitoneal hemorrhage or hematoma. Mesenteric and renal vasculature appear patent. No veno-occlusive process. No bulky adenopathy. Reproductive: Calcified uterine fibroids noted. Other: No inguinal or abdominal wall hernia. Diffuse body wall edema compatible with anasarca. Musculoskeletal: Degenerative changes of the spine. No acute osseous finding. IMPRESSION: Persistent acute left lower quadrant sigmoid diverticulitis with 2 pelvic abscesses as above. Left lower quadrant abscess drain has retracted into the pericolonic fat. No associated obstruction or ileus Developing small right pleural effusion with mild right lower lobe compressive atelectasis. Electronically Signed   By: Jerilynn Mages.  Shick M.D.   On: 02/02/2019 12:29   Ct Image Guided Fluid Drain By Catheter  Result Date: 02/03/2019 INDICATION: Diverticular left lower quadrant pelvic abscesses EXAM: CT DRAIN PELVIC ABSCESS (TRANS GLUTEAL APPROACH) CT DRAIN LEFT LOWER QUADRANT ABSCESS MEDICATIONS: The patient is  currently admitted to the hospital and receiving intravenous antibiotics. The antibiotics were administered within an appropriate time frame prior to the initiation of the procedure. ANESTHESIA/SEDATION: Fentanyl 4 mcg IV; Versed 200 mg IV Moderate Sedation Time:  73 MINUTES The patient was continuously monitored during the procedure by the interventional radiology nurse under my direct supervision. COMPLICATIONS: None immediate. PROCEDURE: Informed written consent was obtained from the patient after a thorough discussion of the procedural risks, benefits and alternatives. All questions were addressed. Maximal Sterile Barrier Technique was utilized including caps, mask, sterile gowns, sterile gloves, sterile drape, hand hygiene and skin antiseptic. A timeout was performed prior to the initiation of the procedure. CT PELVIC ABSCESS DRAIN: Previous imaging reviewed. Patient position prone. Noncontrast localization CT performed. The pelvic fluid collection surrounding the uterus was localized. Overlying skin marked for a left trans gluteal approach. Under sterile conditions and local anesthesia, an 18 gauge access needle was advanced from a left trans gluteal approach into the fluid collection. Needle position confirmed with CT. Syringe aspiration yielded purulent fluid. Guidewire advanced followed by tract dilatation to insert a 10 [place] drain. Drain catheter position confirmed with CT. Syringe aspiration yielded 30 cc exudative fluid. Sample sent for culture. Catheter secured with Prolene suture and connected to external suction bulb. Sterile dressing applied. No immediate complication. Patient tolerated the procedure well. CT LEFT LOWER QUADRANT ABSCESS DRAIN: Patient was repositioned supine. Noncontrast localization CT performed. The left lower quadrant abscess adjacent to the sigmoid colon was localized and marked for a left anterior oblique approach. Under sterile conditions and local anesthesia, an 18 gauge 15  cm access needle was advanced from a left anterior oblique approach into the left lower quadrant abscess. Syringe aspiration yielded fecal contaminated fluid. Sample sent for culture. Guidewire inserted followed by tract dilatation to insert a 10 [place] drain. Drain catheter position confirmed with CT. Catheter secured with a Prolene suture and connected to external suction bulb. Sterile dressing applied. No immediate complication. Patient tolerated the procedure well. IMPRESSION: Successful CT abscess drains within the pelvic  abscess and within the left lower quadrant abscess as above. Electronically Signed   By: Jerilynn Mages.  Shick M.D.   On: 02/03/2019 10:58   Ct Image Guided Fluid Drain By Catheter  Result Date: 02/03/2019 INDICATION: Diverticular left lower quadrant pelvic abscesses EXAM: CT DRAIN PELVIC ABSCESS (TRANS GLUTEAL APPROACH) CT DRAIN LEFT LOWER QUADRANT ABSCESS MEDICATIONS: The patient is currently admitted to the hospital and receiving intravenous antibiotics. The antibiotics were administered within an appropriate time frame prior to the initiation of the procedure. ANESTHESIA/SEDATION: Fentanyl 4 mcg IV; Versed 200 mg IV Moderate Sedation Time:  28 MINUTES The patient was continuously monitored during the procedure by the interventional radiology nurse under my direct supervision. COMPLICATIONS: None immediate. PROCEDURE: Informed written consent was obtained from the patient after a thorough discussion of the procedural risks, benefits and alternatives. All questions were addressed. Maximal Sterile Barrier Technique was utilized including caps, mask, sterile gowns, sterile gloves, sterile drape, hand hygiene and skin antiseptic. A timeout was performed prior to the initiation of the procedure. CT PELVIC ABSCESS DRAIN: Previous imaging reviewed. Patient position prone. Noncontrast localization CT performed. The pelvic fluid collection surrounding the uterus was localized. Overlying skin marked for a  left trans gluteal approach. Under sterile conditions and local anesthesia, an 18 gauge access needle was advanced from a left trans gluteal approach into the fluid collection. Needle position confirmed with CT. Syringe aspiration yielded purulent fluid. Guidewire advanced followed by tract dilatation to insert a 10 [place] drain. Drain catheter position confirmed with CT. Syringe aspiration yielded 30 cc exudative fluid. Sample sent for culture. Catheter secured with Prolene suture and connected to external suction bulb. Sterile dressing applied. No immediate complication. Patient tolerated the procedure well. CT LEFT LOWER QUADRANT ABSCESS DRAIN: Patient was repositioned supine. Noncontrast localization CT performed. The left lower quadrant abscess adjacent to the sigmoid colon was localized and marked for a left anterior oblique approach. Under sterile conditions and local anesthesia, an 18 gauge 15 cm access needle was advanced from a left anterior oblique approach into the left lower quadrant abscess. Syringe aspiration yielded fecal contaminated fluid. Sample sent for culture. Guidewire inserted followed by tract dilatation to insert a 10 [place] drain. Drain catheter position confirmed with CT. Catheter secured with a Prolene suture and connected to external suction bulb. Sterile dressing applied. No immediate complication. Patient tolerated the procedure well. IMPRESSION: Successful CT abscess drains within the pelvic abscess and within the left lower quadrant abscess as above. Electronically Signed   By: Jerilynn Mages.  Shick M.D.   On: 02/03/2019 10:58      LOS: 7 days   Time spent: More than 50% of that time was spent in counseling and/or coordination of care.  Antonieta Pert, MD [facility]  02/03/2019, 11:11 AM

## 2019-02-03 NOTE — Progress Notes (Signed)
Subjective/Chief Complaint: Wbc up yesterday, ct showed drain retracted, just returned from IR, having bowel function feels better   Objective: Vital signs in last 24 hours: Temp:  [98 F (36.7 C)-98.6 F (37 C)] 98.6 F (37 C) (07/04 1300) Pulse Rate:  [60-71] 60 (07/04 1300) Resp:  [16-19] 16 (07/04 1300) BP: (129-167)/(63-122) 143/70 (07/04 1300) SpO2:  [82 %-100 %] 95 % (07/04 1300) Last BM Date: 02/02/19  Intake/Output from previous day: 07/03 0701 - 07/04 0700 In: 1374.9 [P.O.:600; I.V.:324.9; IV Piggyback:450] Out: 0  Intake/Output this shift: Total I/O In: -  Out: 75 [Drains:75]  abd obese some bs present, drain in place, less tender suprapubic and llq than my last exam, no peritonitis   Lab Results:  Recent Labs    02/02/19 0445 02/03/19 0117  WBC 17.4* 18.0*  HGB 11.5* 11.0*  HCT 34.2* 33.1*  PLT 296 290   BMET Recent Labs    02/02/19 0445 02/03/19 0117  NA 135 131*  K 3.5 3.1*  CL 97* 94*  CO2 28 27  GLUCOSE 96 83  BUN <5* <5*  CREATININE 0.50 0.50  CALCIUM 7.9* 7.9*   PT/INR Recent Labs    02/03/19 0117  LABPROT 15.0  INR 1.2   ABG No results for input(s): PHART, HCO3 in the last 72 hours.  Invalid input(s): PCO2, PO2  Studies/Results: Ct Abdomen Pelvis W Contrast  Result Date: 02/02/2019 CLINICAL DATA:  Diverticulitis with abscess status post drain 01/29/2019. Increasing white count. Persistent abdominal pain EXAM: CT ABDOMEN AND PELVIS WITH CONTRAST TECHNIQUE: Multidetector CT imaging of the abdomen and pelvis was performed using the standard protocol following bolus administration of intravenous contrast. CONTRAST:  122mL OMNIPAQUE IOHEXOL 300 MG/ML  SOLN COMPARISON:  None. FINDINGS: Lower chest: Increased small non loculated right effusion with right base compressive atelectasis. Normal heart size. Aorta atherosclerotic. Degenerative changes of lower thoracic spine. Hepatobiliary: No focal liver abnormality is seen. No  gallstones, gallbladder wall thickening, or biliary dilatation. Pancreas: Unremarkable. No pancreatic ductal dilatation or surrounding inflammatory changes. Spleen: Normal in size without focal abnormality. Adrenals/Urinary Tract: Normal adrenal glands. Kidneys demonstrate no acute hydronephrosis, obstruction pattern, hydroureter, ureteral calculus or definite bladder abnormality. Bladder nondistended. Stable bilateral renal cysts, 1 with peripheral calcification in the left hilar region. Similar punctate nonobstructing right lower pole nephrolithiasis. Stomach/Bowel: Negative for bowel obstruction, significant dilatation, ileus, or free air. In the pelvis, the left lower quadrant drain has retracted into the pericolonic fat. There remains a pericolonic sigmoid diverticular abscess measuring 3.5 x 4.8 cm with air-fluid level. There is also dependent pelvic fluid with peripheral enhancement draped around the uterus measuring up to 9.5 x 12.2 cm, also suspicious for abscess. Similar acute diverticulitis in the left lower quadrant sigmoid colon with surrounding inflammation and wall thickening. Vascular/Lymphatic: Extensive aortic atherosclerosis. Negative for aneurysm. No retroperitoneal hemorrhage or hematoma. Mesenteric and renal vasculature appear patent. No veno-occlusive process. No bulky adenopathy. Reproductive: Calcified uterine fibroids noted. Other: No inguinal or abdominal wall hernia. Diffuse body wall edema compatible with anasarca. Musculoskeletal: Degenerative changes of the spine. No acute osseous finding. IMPRESSION: Persistent acute left lower quadrant sigmoid diverticulitis with 2 pelvic abscesses as above. Left lower quadrant abscess drain has retracted into the pericolonic fat. No associated obstruction or ileus Developing small right pleural effusion with mild right lower lobe compressive atelectasis. Electronically Signed   By: Jerilynn Mages.  Shick M.D.   On: 02/02/2019 12:29   Ct Image Guided Fluid  Drain By Catheter  Result  Date: 02/03/2019 INDICATION: Diverticular left lower quadrant pelvic abscesses EXAM: CT DRAIN PELVIC ABSCESS (TRANS GLUTEAL APPROACH) CT DRAIN LEFT LOWER QUADRANT ABSCESS MEDICATIONS: The patient is currently admitted to the hospital and receiving intravenous antibiotics. The antibiotics were administered within an appropriate time frame prior to the initiation of the procedure. ANESTHESIA/SEDATION: Fentanyl 4 mcg IV; Versed 200 mg IV Moderate Sedation Time:  11 MINUTES The patient was continuously monitored during the procedure by the interventional radiology nurse under my direct supervision. COMPLICATIONS: None immediate. PROCEDURE: Informed written consent was obtained from the patient after a thorough discussion of the procedural risks, benefits and alternatives. All questions were addressed. Maximal Sterile Barrier Technique was utilized including caps, mask, sterile gowns, sterile gloves, sterile drape, hand hygiene and skin antiseptic. A timeout was performed prior to the initiation of the procedure. CT PELVIC ABSCESS DRAIN: Previous imaging reviewed. Patient position prone. Noncontrast localization CT performed. The pelvic fluid collection surrounding the uterus was localized. Overlying skin marked for a left trans gluteal approach. Under sterile conditions and local anesthesia, an 18 gauge access needle was advanced from a left trans gluteal approach into the fluid collection. Needle position confirmed with CT. Syringe aspiration yielded purulent fluid. Guidewire advanced followed by tract dilatation to insert a 10 [place] drain. Drain catheter position confirmed with CT. Syringe aspiration yielded 30 cc exudative fluid. Sample sent for culture. Catheter secured with Prolene suture and connected to external suction bulb. Sterile dressing applied. No immediate complication. Patient tolerated the procedure well. CT LEFT LOWER QUADRANT ABSCESS DRAIN: Patient was repositioned supine.  Noncontrast localization CT performed. The left lower quadrant abscess adjacent to the sigmoid colon was localized and marked for a left anterior oblique approach. Under sterile conditions and local anesthesia, an 18 gauge 15 cm access needle was advanced from a left anterior oblique approach into the left lower quadrant abscess. Syringe aspiration yielded fecal contaminated fluid. Sample sent for culture. Guidewire inserted followed by tract dilatation to insert a 10 [place] drain. Drain catheter position confirmed with CT. Catheter secured with a Prolene suture and connected to external suction bulb. Sterile dressing applied. No immediate complication. Patient tolerated the procedure well. IMPRESSION: Successful CT abscess drains within the pelvic abscess and within the left lower quadrant abscess as above. Electronically Signed   By: Jerilynn Mages.  Shick M.D.   On: 02/03/2019 10:58   Ct Image Guided Fluid Drain By Catheter  Result Date: 02/03/2019 INDICATION: Diverticular left lower quadrant pelvic abscesses EXAM: CT DRAIN PELVIC ABSCESS (TRANS GLUTEAL APPROACH) CT DRAIN LEFT LOWER QUADRANT ABSCESS MEDICATIONS: The patient is currently admitted to the hospital and receiving intravenous antibiotics. The antibiotics were administered within an appropriate time frame prior to the initiation of the procedure. ANESTHESIA/SEDATION: Fentanyl 4 mcg IV; Versed 200 mg IV Moderate Sedation Time:  10 MINUTES The patient was continuously monitored during the procedure by the interventional radiology nurse under my direct supervision. COMPLICATIONS: None immediate. PROCEDURE: Informed written consent was obtained from the patient after a thorough discussion of the procedural risks, benefits and alternatives. All questions were addressed. Maximal Sterile Barrier Technique was utilized including caps, mask, sterile gowns, sterile gloves, sterile drape, hand hygiene and skin antiseptic. A timeout was performed prior to the initiation of  the procedure. CT PELVIC ABSCESS DRAIN: Previous imaging reviewed. Patient position prone. Noncontrast localization CT performed. The pelvic fluid collection surrounding the uterus was localized. Overlying skin marked for a left trans gluteal approach. Under sterile conditions and local anesthesia, an 18 gauge access needle  was advanced from a left trans gluteal approach into the fluid collection. Needle position confirmed with CT. Syringe aspiration yielded purulent fluid. Guidewire advanced followed by tract dilatation to insert a 10 [place] drain. Drain catheter position confirmed with CT. Syringe aspiration yielded 30 cc exudative fluid. Sample sent for culture. Catheter secured with Prolene suture and connected to external suction bulb. Sterile dressing applied. No immediate complication. Patient tolerated the procedure well. CT LEFT LOWER QUADRANT ABSCESS DRAIN: Patient was repositioned supine. Noncontrast localization CT performed. The left lower quadrant abscess adjacent to the sigmoid colon was localized and marked for a left anterior oblique approach. Under sterile conditions and local anesthesia, an 18 gauge 15 cm access needle was advanced from a left anterior oblique approach into the left lower quadrant abscess. Syringe aspiration yielded fecal contaminated fluid. Sample sent for culture. Guidewire inserted followed by tract dilatation to insert a 10 [place] drain. Drain catheter position confirmed with CT. Catheter secured with a Prolene suture and connected to external suction bulb. Sterile dressing applied. No immediate complication. Patient tolerated the procedure well. IMPRESSION: Successful CT abscess drains within the pelvic abscess and within the left lower quadrant abscess as above. Electronically Signed   By: Jerilynn Mages.  Shick M.D.   On: 02/03/2019 10:58    Anti-infectives: Anti-infectives (From admission, onward)   Start     Dose/Rate Route Frequency Ordered Stop   02/02/19 1415  fluconazole  (DIFLUCAN) IVPB 400 mg     400 mg 100 mL/hr over 120 Minutes Intravenous Every 24 hours 02/02/19 1408     01/30/19 1200  piperacillin-tazobactam (ZOSYN) IVPB 3.375 g     3.375 g 12.5 mL/hr over 240 Minutes Intravenous Every 8 hours 01/30/19 1045     01/27/19 1015  ciprofloxacin (CIPRO) IVPB 400 mg  Status:  Discontinued     400 mg 200 mL/hr over 60 Minutes Intravenous Every 12 hours 01/27/19 0117 01/30/19 1039   01/27/19 0700  metroNIDAZOLE (FLAGYL) IVPB 500 mg  Status:  Discontinued     500 mg 100 mL/hr over 60 Minutes Intravenous Every 8 hours 01/27/19 0117 01/30/19 1039   01/26/19 2245  ciprofloxacin (CIPRO) IVPB 400 mg     400 mg 200 mL/hr over 60 Minutes Intravenous  Once 01/26/19 2232 01/27/19 0026   01/26/19 2245  metroNIDAZOLE (FLAGYL) IVPB 500 mg     500 mg 100 mL/hr over 60 Minutes Intravenous  Once 01/26/19 2232 01/27/19 0010      Assessment/Plan: Sigmoid diverticulitis with3.8 cmabscess, admission 06/27 - 4-5 prior episodes of diverticulitis, none that required hospitalization  - Last colonoscopy 2013 by Dr. Hilarie Fredrickson showed mild diverticulosis in the left colon -S/Pdrain placement06/61for the abscess, now s/p redrainage due to drain migration -Hopefully she will continue to improve and can plan for elective one-stage surgery later on once her acute flare up has resolved. If she does not improve and requires surgery this admission it will likely be colectomy/colostomy. -Continue drain, IV antibiotics. Will continue to follow. ID -cipro/flagyl 6/26-06/30; Zosyn 06/30>> VTE -SCDs, sq heparin FEN -FLD, oxy PO for pain Foley -none Follow up -Dr. Leighton Ruff   Heather Lam 02/03/2019

## 2019-02-03 NOTE — Progress Notes (Signed)
Referring Physician(s): Dr Jeanmarie Hubert  Supervising Physician: Daryll Brod  Patient Status:  [facility] - In-pt  Chief Complaint:  Diverticular abscess  Subjective: "I need another drain."  Allergies: Patient has no known allergies.  Medications: Prior to Admission medications   Medication Sig Start Date End Date Taking? Authorizing Provider  ALPRAZolam (XANAX) 0.25 MG tablet Take 1 tablet (0.25 mg total) by mouth daily as needed for anxiety. 08/07/18  Yes Terald Sleeper, PA-C  amoxicillin-clavulanate (AUGMENTIN) 875-125 MG tablet Take 1 tablet by mouth 2 (two) times daily for 7 days. 01/25/19 02/01/19 Yes Gottschalk, Leatrice Jewels M, DO  aspirin EC 325 MG tablet Take 325 mg by mouth daily.   Yes [provider]  busPIRone (BUSPAR) 15 MG tablet Take 1 tablet (15 mg total) by mouth 3 (three) times daily. 08/07/18  Yes Terald Sleeper, PA-C  Cholecalciferol 2000 units CAPS Take 1 capsule (2,000 Units total) by mouth daily. 08/17/16  Yes Beasley, Caren D, MD  Coenzyme Q10 (CO Q10) 100 MG CAPS Take 1 capsule by mouth daily.   Yes [provider]  DULoxetine (CYMBALTA) 30 MG capsule Take 2 capsules (60 mg total) by mouth daily. 01/01/19  Yes Terald Sleeper, PA-C  losartan-hydrochlorothiazide (HYZAAR) 100-25 MG tablet Take 1 tablet by mouth daily. 07/24/18  Yes Terald Sleeper, PA-C  metoprolol tartrate (LOPRESSOR) 25 MG tablet TAKE 1 TABLET BY MOUTH 2 TIMES DAILY. Patient taking differently: Take 25 mg by mouth 2 (two) times daily.  10/24/18  Yes Terald Sleeper, PA-C  metroNIDAZOLE (FLAGYL) 500 MG tablet Take 1 tablet (500 mg total) by mouth 3 (three) times daily. 01/25/19  Yes Gottschalk, Ashly M, DO  nitroGLYCERIN (NITROSTAT) 0.4 MG SL tablet 1 TABLET UNDER TONGUE EVERY 5 MINUTES UP TO 3 TIMES FOR CHEST PAIN THEN CALL DR IF NO RELIEF Patient taking differently: Place 0.4 mg under the tongue every 5 (five) minutes as needed for chest pain. 1 TABLET UNDER TONGUE EVERY 5 MINUTES UP TO 3 TIMES  FOR CHEST PAIN THEN CALL DR IF NO RELIEF 02/23/18  Yes Terald Sleeper, PA-C  rosuvastatin (CRESTOR) 10 MG tablet Take 1 tablet (10 mg total) by mouth daily. 07/03/18  Yes Terald Sleeper, PA-C  terbinafine (LAMISIL) 250 MG tablet Take 1 tablet (250 mg total) by mouth daily. 12/19/18  Yes Terald Sleeper, PA-C  traZODone (DESYREL) 50 MG tablet Take 1-2 tablets (50-100 mg total) by mouth at bedtime as needed for sleep. 05/03/18  Yes Jones, Angel S, PA-C  VASCEPA 1 g CAPS TAKE 2 CAPULES BY MOUTH TWICE DAILY Patient taking differently: Take 2 capsules by mouth 2 (two) times a day.  07/11/18  Yes Terald Sleeper, PA-C  Vitamin D, Ergocalciferol, (DRISDOL) 1.25 MG (50000 UT) CAPS capsule Take 1 capsule (50,000 Units total) by mouth every 7 (seven) days. Patient taking differently: Take 50,000 Units by mouth every Sunday.  08/30/18  Yes Terald Sleeper, PA-C  econazole nitrate 1 % cream Apply topically 2 (two) times daily. 12/19/18   Terald Sleeper, PA-C     Vital Signs: BP 134/66 (BP Location: Right Arm)    Pulse 63    Temp 98 F (36.7 C) (Oral)    Resp 16    Ht 5\' 3"  (1.6 m)    Wt 218 lb 14.7 oz (99.3 kg)    LMP 12/31/2013    SpO2 97%    BMI 38.78 kg/m   Physical Exam Vitals signs reviewed.  Mouth: clear, moist, airway intact Cardio: RRR, no murmurs or rubs Chest:  CTAB, no increased WOB, no distress Abdomen: distended, moderately tender.  Drain in place in LLQ.  Skin suture intact. Small amount of bloody output.   Imaging: Ct Abdomen Pelvis W Contrast  Result Date: 02/02/2019 CLINICAL DATA:  Diverticulitis with abscess status post drain 01/29/2019. Increasing white count. Persistent abdominal pain EXAM: CT ABDOMEN AND PELVIS WITH CONTRAST TECHNIQUE: Multidetector CT imaging of the abdomen and pelvis was performed using the standard protocol following bolus administration of intravenous contrast. CONTRAST:  169mL OMNIPAQUE IOHEXOL 300 MG/ML  SOLN COMPARISON:  None. FINDINGS: Lower chest: Increased small  non loculated right effusion with right base compressive atelectasis. Normal heart size. Aorta atherosclerotic. Degenerative changes of lower thoracic spine. Hepatobiliary: No focal liver abnormality is seen. No gallstones, gallbladder wall thickening, or biliary dilatation. Pancreas: Unremarkable. No pancreatic ductal dilatation or surrounding inflammatory changes. Spleen: Normal in size without focal abnormality. Adrenals/Urinary Tract: Normal adrenal glands. Kidneys demonstrate no acute hydronephrosis, obstruction pattern, hydroureter, ureteral calculus or definite bladder abnormality. Bladder nondistended. Stable bilateral renal cysts, 1 with peripheral calcification in the left hilar region. Similar punctate nonobstructing right lower pole nephrolithiasis. Stomach/Bowel: Negative for bowel obstruction, significant dilatation, ileus, or free air. In the pelvis, the left lower quadrant drain has retracted into the pericolonic fat. There remains a pericolonic sigmoid diverticular abscess measuring 3.5 x 4.8 cm with air-fluid level. There is also dependent pelvic fluid with peripheral enhancement draped around the uterus measuring up to 9.5 x 12.2 cm, also suspicious for abscess. Similar acute diverticulitis in the left lower quadrant sigmoid colon with surrounding inflammation and wall thickening. Vascular/Lymphatic: Extensive aortic atherosclerosis. Negative for aneurysm. No retroperitoneal hemorrhage or hematoma. Mesenteric and renal vasculature appear patent. No veno-occlusive process. No bulky adenopathy. Reproductive: Calcified uterine fibroids noted. Other: No inguinal or abdominal wall hernia. Diffuse body wall edema compatible with anasarca. Musculoskeletal: Degenerative changes of the spine. No acute osseous finding. IMPRESSION: Persistent acute left lower quadrant sigmoid diverticulitis with 2 pelvic abscesses as above. Left lower quadrant abscess drain has retracted into the pericolonic fat. No  associated obstruction or ileus Developing small right pleural effusion with mild right lower lobe compressive atelectasis. Electronically Signed   By: Jerilynn Mages.  Shick M.D.   On: 02/02/2019 12:29   Vas [place] Lower Extremity Venous (dvt)  Result Date: 02/01/2019  Lower Venous Study Indications: Swelling.  Risk Factors: None identified. Comparison Study: No prior studies. Performing Technologist: Oliver Hum RVT  Examination Guidelines: A complete evaluation includes B-mode imaging, spectral Doppler, color Doppler, and power Doppler as needed of all accessible portions of each vessel. Bilateral testing is considered an integral part of a complete examination. Limited examinations for reoccurring indications may be performed as noted.  +---------+---------------+---------+-----------+----------+-------+  RIGHT     Compressibility Phasicity Spontaneity Properties Summary  +---------+---------------+---------+-----------+----------+-------+  CFV       Full            Yes       Yes                             +---------+---------------+---------+-----------+----------+-------+  SFJ       Full                                                      +---------+---------------+---------+-----------+----------+-------+  FV Prox   Full                                                      +---------+---------------+---------+-----------+----------+-------+  FV Mid    Full                                                      +---------+---------------+---------+-----------+----------+-------+  FV Distal Full                                                      +---------+---------------+---------+-----------+----------+-------+  PFV       Full                                                      +---------+---------------+---------+-----------+----------+-------+  POP       Full            Yes       Yes                             +---------+---------------+---------+-----------+----------+-------+  PTV       Full                                                       +---------+---------------+---------+-----------+----------+-------+  PERO      Full                                                      +---------+---------------+---------+-----------+----------+-------+   +----+---------------+---------+-----------+----------+-------+  LEFT Compressibility Phasicity Spontaneity Properties Summary  +----+---------------+---------+-----------+----------+-------+  CFV  Full            Yes       Yes                             +----+---------------+---------+-----------+----------+-------+     Summary: Right: There is no evidence of deep vein thrombosis in the lower extremity. No cystic structure found in the popliteal fossa. Left: No evidence of common femoral vein obstruction.  *See table(s) above for measurements and observations. Electronically signed by Monica Martinez MD on 02/01/2019 at 2:20:18 PM.    Final    Vas [place] Upper Extremity Venous Duplex  Result Date: 02/01/2019 UPPER VENOUS STUDY  Indications: Swelling Risk Factors: None identified. Comparison Study: No prior studies. Performing Technologist: Oliver Hum RVT  Examination Guidelines: A complete evaluation includes B-mode imaging, spectral Doppler, color Doppler, and power Doppler as needed of all accessible portions  of each vessel. Bilateral testing is considered an integral part of a complete examination. Limited examinations for reoccurring indications may be performed as noted.  Right Findings: +----------+------------+---------+-----------+----------+-------+  RIGHT      Compressible Phasicity Spontaneous Properties Summary  +----------+------------+---------+-----------+----------+-------+  IJV            Full        Yes        Yes                         +----------+------------+---------+-----------+----------+-------+  Subclavian     Full        Yes        Yes                         +----------+------------+---------+-----------+----------+-------+  Axillary       Full         Yes        Yes                         +----------+------------+---------+-----------+----------+-------+  Brachial       Full        Yes        Yes                         +----------+------------+---------+-----------+----------+-------+  Radial         Full                                               +----------+------------+---------+-----------+----------+-------+  Ulnar          Full                                               +----------+------------+---------+-----------+----------+-------+  Cephalic       Full                                               +----------+------------+---------+-----------+----------+-------+  Basilic        Full                                               +----------+------------+---------+-----------+----------+-------+  Left Findings: +----------+------------+---------+-----------+----------+-------+  LEFT       Compressible Phasicity Spontaneous Properties Summary  +----------+------------+---------+-----------+----------+-------+  Subclavian     Full        Yes        Yes                         +----------+------------+---------+-----------+----------+-------+  Summary:  Right: No evidence of deep vein thrombosis in the upper extremity. No evidence of superficial vein thrombosis in the upper extremity.  Left: No evidence of thrombosis in the subclavian.  *See table(s) above for measurements and observations.  Diagnosing physician: Monica Martinez MD Electronically signed by Monica Martinez MD on 02/01/2019 at 2:19:58  PM.    Final     Labs:  CBC: Recent Labs    01/31/19 0630 02/01/19 0527 02/02/19 0445 02/03/19 0117  WBC 15.2* 15.9* 17.4* 18.0*  HGB 11.3* 11.0* 11.5* 11.0*  HCT 34.8* 33.1* 34.2* 33.1*  PLT 269 284 296 290    COAGS: Recent Labs    01/26/19 2032 01/28/19 0542 02/03/19 0117  INR 1.0 1.3* 1.2    BMP: Recent Labs    01/30/19 0224 02/01/19 0527 02/02/19 0445 02/03/19 0117  NA 135 136 135 131*  K 4.4 3.0* 3.5 3.1*    CL 101 99 97* 94*  CO2 27 26 28 27   GLUCOSE 113* 92 96 83  BUN 6 <5* <5* <5*  CALCIUM 7.8* 7.7* 7.9* 7.9*  CREATININE 0.60 0.42* 0.50 0.50  GFRNONAA >60 >60 >60 >60  GFRAA >60 >60 >60 >60    LIVER FUNCTION TESTS: Recent Labs    01/27/19 0718 01/28/19 0542 01/29/19 0557 01/30/19 0224  BILITOT 0.6 0.4 0.2* 0.6  AST 10* 12* 10* 9*  ALT 12 11 9 8   ALKPHOS 53 63 69 66  PROT 5.9* 6.3* 5.5* 5.2*  ALBUMIN 2.8* 3.0* 2.2* 2.1*    Assessment and Plan: Diverticular abscess s/p drain placement 6/29. LLQ drain remains in place.  WBC trending up. Now 18K. Repeat CT showed: Persistent acute left lower quadrant sigmoid diverticulitis with 2 pelvic abscesses as above. Left lower quadrant abscess drain has retracted into the pericolonic fat. Reviewed by Dr. Annamaria Boots.  Patient will need replacement of her LLQ drain and likely new placement for pelvic collection suspicious for abscess.  Plan to proceed in CT today.   Electronically Signed: Docia Barrier, PA 02/03/2019, 8:48 AM   I spent a total of 15 Minutes at the the patient's bedside AND on the patient's hospital floor or unit, greater than 50% of which was counseling/coordinating care for LLQ abscess drain

## 2019-02-04 LAB — BASIC METABOLIC PANEL
Anion gap: 12 (ref 5–15)
BUN: <5 mg/dL — ABNORMAL LOW (ref 6–20)
CO2: 26 mmol/L (ref 22–32)
Calcium: 8 mg/dL — ABNORMAL LOW (ref 8.9–10.3)
Chloride: 97 mmol/L — ABNORMAL LOW (ref 98–111)
Creatinine, Ser: 0.53 mg/dL (ref 0.44–1.00)
GFR calc Af Amer: >60 mL/min (ref >60)
GFR calc non Af Amer: >60 mL/min (ref >60)
Glucose, Bld: 78 mg/dL (ref 70–99)
Potassium: 3.4 mmol/L — ABNORMAL LOW (ref 3.5–5.1)
Sodium: 135 mmol/L (ref 135–145)

## 2019-02-04 LAB — CBC
HCT: 32.4 % — ABNORMAL LOW (ref 36.0–46.0)
Hemoglobin: 10.7 g/dL — ABNORMAL LOW (ref 12.0–15.0)
MCH: 27.6 pg (ref 26.0–34.0)
MCHC: 33 g/dL (ref 30.0–36.0)
MCV: 83.7 fL (ref 80.0–100.0)
Platelets: 295 10*3/uL (ref 150–400)
RBC: 3.87 MIL/uL (ref 3.87–5.11)
RDW: 13.5 % (ref 11.5–15.5)
WBC: 14.5 10*3/uL — ABNORMAL HIGH (ref 4.0–10.5)
nRBC: 0 % (ref 0.0–0.2)

## 2019-02-04 MED ORDER — POTASSIUM CHLORIDE IN NACL 20-0.9 MEQ/L-% IV SOLN
INTRAVENOUS | Status: DC
  Administered 2019-02-04 – 2019-02-07 (×3): via INTRAVENOUS
  Filled 2019-02-04 (×4): qty 1000

## 2019-02-04 MED ORDER — ENOXAPARIN SODIUM 40 MG/0.4ML ~~LOC~~ SOLN
40.0000 mg | SUBCUTANEOUS | Status: DC
  Administered 2019-02-04 – 2019-02-14 (×10): 40 mg via SUBCUTANEOUS
  Filled 2019-02-04 (×10): qty 0.4

## 2019-02-04 NOTE — Plan of Care (Signed)
  Problem: Education: Goal: Knowledge of General Education information will improve Description: Including pain rating scale, medication(s)/side effects and non-pharmacologic comfort measures Outcome: Progressing   Problem: Clinical Measurements: Goal: Ability to maintain clinical measurements within normal limits will improve Outcome: Progressing   Problem: Coping: Goal: Level of anxiety will decrease Outcome: Progressing   Problem: Elimination: Goal: Will not experience complications related to bowel motility Outcome: Progressing Goal: Will not experience complications related to urinary retention Outcome: Progressing   Problem: Pain Managment: Goal: General experience of comfort will improve Outcome: Progressing

## 2019-02-04 NOTE — Progress Notes (Signed)
Patient ID: LIBRA Heather Lam, female   DOB: 1961-03-15, 58 y.o.   MRN: [id number] [facility] Progress Note:   * No surgery found *  Subjective: Mental status is clear Objective: Vital signs in last 24 hours: Temp:  [98.4 F (36.9 C)-99.1 F (37.3 C)] 98.4 F (36.9 C) (07/05 0558) Pulse Rate:  [56-67] 61 (07/05 0558) Resp:  [16-19] 18 (07/05 0558) BP: (129-153)/(66-73) 153/73 (07/05 0558) SpO2:  [93 %-98 %] 97 % (07/05 0558) Weight:  [97.8 kg] 97.8 kg (07/05 0500)  Intake/Output from previous day: 07/04 0701 - 07/05 0700 In: 1304.1 [P.O.:400; I.V.:680.1; IV Piggyback:204.1] Out: 463 [Urine:300; Drains:163] Intake/Output this shift: No intake/output data recorded.  Physical Exam: Work of breathing is normal;  Feeling better after second drain inserted;  Anterior drain is brown whereas posterior drain is more serous  Lab Results:  Results for orders placed or performed during the hospital encounter of 01/26/19 (from the past 48 hour(s))  Protime-INR     Status: None   Collection Time: 02/03/19  1:17 AM  Result Value Ref Range   Prothrombin Time 15.0 11.4 - 15.2 seconds   INR 1.2 0.8 - 1.2    Comment: (NOTE) INR goal varies based on device and disease states. Performed at [facility], [place] [address], [place], [place] [address]   Magnesium     Status: None   Collection Time: 02/03/19  1:17 AM  Result Value Ref Range   Magnesium 1.7 1.7 - 2.4 mg/dL    Comment: Performed at [facility] [address] [place], [place] [address]  Basic metabolic panel     Status: Abnormal   Collection Time: 02/03/19  1:17 AM  Result Value Ref Range   Sodium 131 (L) 135 - 145 mmol/L   Potassium 3.1 (L) 3.5 - 5.1 mmol/L   Chloride 94 (L) 98 - 111 mmol/L   CO2 27 22 - 32 mmol/L   Glucose, Bld 83 70 - 99 mg/dL   BUN <5 (L) 6 - 20 mg/dL   Creatinine, Ser 0.50 0.44 - 1.00 mg/dL   Calcium 7.9 (L) 8.9 - 10.3 mg/dL   GFR calc non Af Amer >60 >60 mL/min   GFR calc Af Amer  >60 >60 mL/min   Anion gap 10 5 - 15    Comment: Performed at [facility] [address], [place], [place] [address]  CBC     Status: Abnormal   Collection Time: 02/03/19  1:17 AM  Result Value Ref Range   WBC 18.0 (H) 4.0 - 10.5 K/uL   RBC 3.94 3.87 - 5.11 MIL/uL   Hemoglobin 11.0 (L) 12.0 - 15.0 g/dL   HCT 33.1 (L) 36.0 - 46.0 %   MCV 84.0 80.0 - 100.0 fL   MCH 27.9 26.0 - 34.0 pg   MCHC 33.2 30.0 - 36.0 g/dL   RDW 13.5 11.5 - 15.5 %   Platelets 290 150 - 400 K/uL   nRBC 0.0 0.0 - 0.2 %    Comment: Performed at [facility], [place] [address] [place], [place] [address]  Aerobic/Anaerobic Culture (surgical/deep wound)     Status: None (Preliminary result)   Collection Time: 02/03/19 10:21 AM   Specimen: Pelvis; Abscess  Result Value Ref Range   Specimen Description PELVIS    Special Requests Immunocompromised    Gram Stain      ABUNDANT WBC PRESENT, PREDOMINANTLY PMN RARE GRAM POSITIVE COCCI IN PAIRS IN CHAINS  Performed at [facility], [place] [address] [place], [place] [address]    Culture PENDING    Report Status PENDING   Aerobic/Anaerobic Culture (surgical/deep wound)     Status: None (Preliminary result)   Collection Time: 02/03/19 10:22 AM   Specimen: Abscess  Result Value Ref Range   Specimen Description ABSCESS    Special Requests Immunocompromised    Gram Stain      ABUNDANT WBC PRESENT, PREDOMINANTLY PMN ABUNDANT GRAM NEGATIVE RODS FEW GRAM POSITIVE COCCI IN CLUSTERS Performed at [facility], [place] [address] [place], [place] [address]    Culture PENDING    Report Status PENDING   Basic metabolic panel     Status: Abnormal   Collection Time: 02/04/19  8:34 AM  Result Value Ref Range   Sodium 135 135 - 145 mmol/L   Potassium 3.4 (L) 3.5 - 5.1 mmol/L   Chloride 97 (L) 98 - 111 mmol/L   CO2 26 22 - 32 mmol/L   Glucose, Bld 78 70 - 99 mg/dL   BUN <5 (L) 6 - 20 mg/dL   Creatinine, Ser 0.53 0.44 - 1.00 mg/dL   Calcium 8.0 (L) 8.9 -  10.3 mg/dL   GFR calc non Af Amer >60 >60 mL/min   GFR calc Af Amer >60 >60 mL/min   Anion gap 12 5 - 15    Comment: Performed at [facility], [place] [address], [place], [place] [address]  CBC     Status: Abnormal   Collection Time: 02/04/19  8:34 AM  Result Value Ref Range   WBC 14.5 (H) 4.0 - 10.5 K/uL   RBC 3.87 3.87 - 5.11 MIL/uL   Hemoglobin 10.7 (L) 12.0 - 15.0 g/dL   HCT 32.4 (L) 36.0 - 46.0 %   MCV 83.7 80.0 - 100.0 fL   MCH 27.6 26.0 - 34.0 pg   MCHC 33.0 30.0 - 36.0 g/dL   RDW 13.5 11.5 - 15.5 %   Platelets 295 150 - 400 K/uL   nRBC 0.0 0.0 - 0.2 %    Comment: Performed at [facility], [place] [address] [place], [place] [address]    Radiology/Results: Ct Image Guided Fluid Drain By Catheter  Result Date: 02/03/2019 INDICATION: Diverticular left lower quadrant pelvic abscesses EXAM: CT DRAIN PELVIC ABSCESS (TRANS GLUTEAL APPROACH) CT DRAIN LEFT LOWER QUADRANT ABSCESS MEDICATIONS: The patient is currently admitted to the hospital and receiving intravenous antibiotics. The antibiotics were administered within an appropriate time frame prior to the initiation of the procedure. ANESTHESIA/SEDATION: Fentanyl 4 mcg IV; Versed 200 mg IV Moderate Sedation Time:  73 MINUTES The patient was continuously monitored during the procedure by the interventional radiology nurse under my direct supervision. COMPLICATIONS: None immediate. PROCEDURE: Informed written consent was obtained from the patient after a thorough discussion of the procedural risks, benefits and alternatives. All questions were addressed. Maximal Sterile Barrier Technique was utilized including caps, mask, sterile gowns, sterile gloves, sterile drape, hand hygiene and skin antiseptic. A timeout was performed prior to the initiation of the procedure. CT PELVIC ABSCESS DRAIN: Previous imaging reviewed. Patient position prone. Noncontrast localization CT performed. The pelvic fluid collection surrounding the uterus was  localized. Overlying skin marked for a left trans gluteal approach. Under sterile conditions and local anesthesia, an 18 gauge access needle was advanced from a left trans gluteal approach into the fluid collection. Needle position confirmed with CT. Syringe aspiration yielded purulent fluid. Guidewire advanced followed by tract  dilatation to insert a 10 [place] drain. Drain catheter position confirmed with CT. Syringe aspiration yielded 30 cc exudative fluid. Sample sent for culture. Catheter secured with Prolene suture and connected to external suction bulb. Sterile dressing applied. No immediate complication. Patient tolerated the procedure well. CT LEFT LOWER QUADRANT ABSCESS DRAIN: Patient was repositioned supine. Noncontrast localization CT performed. The left lower quadrant abscess adjacent to the sigmoid colon was localized and marked for a left anterior oblique approach. Under sterile conditions and local anesthesia, an 18 gauge 15 cm access needle was advanced from a left anterior oblique approach into the left lower quadrant abscess. Syringe aspiration yielded fecal contaminated fluid. Sample sent for culture. Guidewire inserted followed by tract dilatation to insert a 10 [place] drain. Drain catheter position confirmed with CT. Catheter secured with a Prolene suture and connected to external suction bulb. Sterile dressing applied. No immediate complication. Patient tolerated the procedure well. IMPRESSION: Successful CT abscess drains within the pelvic abscess and within the left lower quadrant abscess as above. Electronically Signed   By: Jerilynn Mages.  Shick M.D.   On: 02/03/2019 10:58   Ct Image Guided Fluid Drain By Catheter  Result Date: 02/03/2019 INDICATION: Diverticular left lower quadrant pelvic abscesses EXAM: CT DRAIN PELVIC ABSCESS (TRANS GLUTEAL APPROACH) CT DRAIN LEFT LOWER QUADRANT ABSCESS MEDICATIONS: The patient is currently admitted to the hospital and receiving intravenous antibiotics. The  antibiotics were administered within an appropriate time frame prior to the initiation of the procedure. ANESTHESIA/SEDATION: Fentanyl 4 mcg IV; Versed 200 mg IV Moderate Sedation Time:  25 MINUTES The patient was continuously monitored during the procedure by the interventional radiology nurse under my direct supervision. COMPLICATIONS: None immediate. PROCEDURE: Informed written consent was obtained from the patient after a thorough discussion of the procedural risks, benefits and alternatives. All questions were addressed. Maximal Sterile Barrier Technique was utilized including caps, mask, sterile gowns, sterile gloves, sterile drape, hand hygiene and skin antiseptic. A timeout was performed prior to the initiation of the procedure. CT PELVIC ABSCESS DRAIN: Previous imaging reviewed. Patient position prone. Noncontrast localization CT performed. The pelvic fluid collection surrounding the uterus was localized. Overlying skin marked for a left trans gluteal approach. Under sterile conditions and local anesthesia, an 18 gauge access needle was advanced from a left trans gluteal approach into the fluid collection. Needle position confirmed with CT. Syringe aspiration yielded purulent fluid. Guidewire advanced followed by tract dilatation to insert a 10 [place] drain. Drain catheter position confirmed with CT. Syringe aspiration yielded 30 cc exudative fluid. Sample sent for culture. Catheter secured with Prolene suture and connected to external suction bulb. Sterile dressing applied. No immediate complication. Patient tolerated the procedure well. CT LEFT LOWER QUADRANT ABSCESS DRAIN: Patient was repositioned supine. Noncontrast localization CT performed. The left lower quadrant abscess adjacent to the sigmoid colon was localized and marked for a left anterior oblique approach. Under sterile conditions and local anesthesia, an 18 gauge 15 cm access needle was advanced from a left anterior oblique approach into the  left lower quadrant abscess. Syringe aspiration yielded fecal contaminated fluid. Sample sent for culture. Guidewire inserted followed by tract dilatation to insert a 10 [place] drain. Drain catheter position confirmed with CT. Catheter secured with a Prolene suture and connected to external suction bulb. Sterile dressing applied. No immediate complication. Patient tolerated the procedure well. IMPRESSION: Successful CT abscess drains within the pelvic abscess and within the left lower quadrant abscess as above. Electronically Signed   By: Jerilynn Mages.  Shick M.D.  On: 02/03/2019 10:58    Anti-infectives: Anti-infectives (From admission, onward)   Start     Dose/Rate Route Frequency Ordered Stop   02/02/19 1415  fluconazole (DIFLUCAN) IVPB 400 mg     400 mg 100 mL/hr over 120 Minutes Intravenous Every 24 hours 02/02/19 1408     01/30/19 1200  piperacillin-tazobactam (ZOSYN) IVPB 3.375 g     3.375 g 12.5 mL/hr over 240 Minutes Intravenous Every 8 hours 01/30/19 1045     01/27/19 1015  ciprofloxacin (CIPRO) IVPB 400 mg  Status:  Discontinued     400 mg 200 mL/hr over 60 Minutes Intravenous Every 12 hours 01/27/19 0117 01/30/19 1039   01/27/19 0700  metroNIDAZOLE (FLAGYL) IVPB 500 mg  Status:  Discontinued     500 mg 100 mL/hr over 60 Minutes Intravenous Every 8 hours 01/27/19 0117 01/30/19 1039   01/26/19 2245  ciprofloxacin (CIPRO) IVPB 400 mg     400 mg 200 mL/hr over 60 Minutes Intravenous  Once 01/26/19 2232 01/27/19 0026   01/26/19 2245  metroNIDAZOLE (FLAGYL) IVPB 500 mg     500 mg 100 mL/hr over 60 Minutes Intravenous  Once 01/26/19 2232 01/27/19 0010      Assessment/Plan: Problem List: Patient Active Problem List   Diagnosis Date Noted  . Diverticulitis 01/27/2019  . Hypokalemia   . Hypomagnesemia   . Intra-abdominal abscess ([place])   . Left lower quadrant abdominal pain   . Actinic keratoses 05/04/2017  . History of MI (myocardial infarction) 04/01/2017  . Class 1 obesity without  serious comorbidity with body mass index (BMI) of 34.0 to 34.9 in adult 11/03/2016  . Depression 09/28/2016  . Prediabetes 09/28/2016  . Vitamin D deficiency 08/16/2016  . Recurrent major depressive disorder, in partial remission ([place]) 05/25/2016  . Adjustment disorder 05/25/2016  . Myalgia 05/25/2016  . Carpal tunnel syndrome 02/26/2016  . Obesity (BMI 30-39.9) 02/25/2016  . Insomnia 07/01/2015  . Essential hypertension 05/21/2015  . Generalized anxiety disorder 06/06/2013  . CAD S/P percutaneous coronary angioplasty 09/13/2011  . Antiplatelet or antithrombotic long-term use 09/13/2011  . Acute diverticulitis 09/13/2011  . OSA on CPAP 09/13/2011  . Hyperlipidemia 09/13/2011    Had repositioning of abscess drains yesterday with better drainage and clinical improvement.   * No surgery found *    LOS: 8 days   Matt B. Hassell Done, MD, [facility] [phone] beeper [phone]  02/04/2019 10:07 AM

## 2019-02-04 NOTE — Progress Notes (Signed)
Referring Physician(s): Dr Jeanmarie Hubert  Supervising Physician: Daryll Brod  Patient Status:  [facility] - In-pt  Chief Complaint: Diverticular abscess  Subjective: Feeling much better today.  Less pain to LLQ drain site.  Sitting up in chair. More mobile.  Allergies: Patient has no known allergies.  Medications: Prior to Admission medications   Medication Sig Start Date End Date Taking? Authorizing Provider  ALPRAZolam (XANAX) 0.25 MG tablet Take 1 tablet (0.25 mg total) by mouth daily as needed for anxiety. 08/07/18  Yes Terald Sleeper, PA-C  amoxicillin-clavulanate (AUGMENTIN) 875-125 MG tablet Take 1 tablet by mouth 2 (two) times daily for 7 days. 01/25/19 02/01/19 Yes Gottschalk, Leatrice Jewels M, DO  aspirin EC 325 MG tablet Take 325 mg by mouth daily.   Yes [provider]  busPIRone (BUSPAR) 15 MG tablet Take 1 tablet (15 mg total) by mouth 3 (three) times daily. 08/07/18  Yes Terald Sleeper, PA-C  Cholecalciferol 2000 units CAPS Take 1 capsule (2,000 Units total) by mouth daily. 08/17/16  Yes Beasley, Caren D, MD  Coenzyme Q10 (CO Q10) 100 MG CAPS Take 1 capsule by mouth daily.   Yes [provider]  DULoxetine (CYMBALTA) 30 MG capsule Take 2 capsules (60 mg total) by mouth daily. 01/01/19  Yes Terald Sleeper, PA-C  losartan-hydrochlorothiazide (HYZAAR) 100-25 MG tablet Take 1 tablet by mouth daily. 07/24/18  Yes Terald Sleeper, PA-C  metoprolol tartrate (LOPRESSOR) 25 MG tablet TAKE 1 TABLET BY MOUTH 2 TIMES DAILY. Patient taking differently: Take 25 mg by mouth 2 (two) times daily.  10/24/18  Yes Terald Sleeper, PA-C  metroNIDAZOLE (FLAGYL) 500 MG tablet Take 1 tablet (500 mg total) by mouth 3 (three) times daily. 01/25/19  Yes Gottschalk, Ashly M, DO  nitroGLYCERIN (NITROSTAT) 0.4 MG SL tablet 1 TABLET UNDER TONGUE EVERY 5 MINUTES UP TO 3 TIMES FOR CHEST PAIN THEN CALL DR IF NO RELIEF Patient taking differently: Place 0.4 mg under the tongue every 5 (five) minutes as needed  for chest pain. 1 TABLET UNDER TONGUE EVERY 5 MINUTES UP TO 3 TIMES FOR CHEST PAIN THEN CALL DR IF NO RELIEF 02/23/18  Yes Terald Sleeper, PA-C  rosuvastatin (CRESTOR) 10 MG tablet Take 1 tablet (10 mg total) by mouth daily. 07/03/18  Yes Terald Sleeper, PA-C  terbinafine (LAMISIL) 250 MG tablet Take 1 tablet (250 mg total) by mouth daily. 12/19/18  Yes Terald Sleeper, PA-C  traZODone (DESYREL) 50 MG tablet Take 1-2 tablets (50-100 mg total) by mouth at bedtime as needed for sleep. 05/03/18  Yes Jones, Angel S, PA-C  VASCEPA 1 g CAPS TAKE 2 CAPULES BY MOUTH TWICE DAILY Patient taking differently: Take 2 capsules by mouth 2 (two) times a day.  07/11/18  Yes Terald Sleeper, PA-C  Vitamin D, Ergocalciferol, (DRISDOL) 1.25 MG (50000 UT) CAPS capsule Take 1 capsule (50,000 Units total) by mouth every 7 (seven) days. Patient taking differently: Take 50,000 Units by mouth every Sunday.  08/30/18  Yes Terald Sleeper, PA-C  econazole nitrate 1 % cream Apply topically 2 (two) times daily. 12/19/18   Terald Sleeper, PA-C     Vital Signs: BP (!) 153/73 (BP Location: Left Arm)    Pulse 61    Temp 98.4 F (36.9 C) (Oral)    Resp 18    Ht 5\' 3"  (1.6 m)    Wt 215 lb 9.8 oz (97.8 kg)    LMP 12/31/2013    SpO2  97%    BMI 38.19 kg/m   Physical Exam Vitals signs reviewed.   NAD, alert Abdomen: distended, moderately tender over drain insertion sites.  Drain in place in LLQ.  Skin suture intact. Dark bloody output in bulb. TG drain in place.  Insertion site intact.  Cloudy yellow output in bulb. Flushes easily.   Imaging: Ct Abdomen Pelvis W Contrast  Result Date: 02/02/2019 CLINICAL DATA:  Diverticulitis with abscess status post drain 01/29/2019. Increasing white count. Persistent abdominal pain EXAM: CT ABDOMEN AND PELVIS WITH CONTRAST TECHNIQUE: Multidetector CT imaging of the abdomen and pelvis was performed using the standard protocol following bolus administration of intravenous contrast. CONTRAST:  169mL  OMNIPAQUE IOHEXOL 300 MG/ML  SOLN COMPARISON:  None. FINDINGS: Lower chest: Increased small non loculated right effusion with right base compressive atelectasis. Normal heart size. Aorta atherosclerotic. Degenerative changes of lower thoracic spine. Hepatobiliary: No focal liver abnormality is seen. No gallstones, gallbladder wall thickening, or biliary dilatation. Pancreas: Unremarkable. No pancreatic ductal dilatation or surrounding inflammatory changes. Spleen: Normal in size without focal abnormality. Adrenals/Urinary Tract: Normal adrenal glands. Kidneys demonstrate no acute hydronephrosis, obstruction pattern, hydroureter, ureteral calculus or definite bladder abnormality. Bladder nondistended. Stable bilateral renal cysts, 1 with peripheral calcification in the left hilar region. Similar punctate nonobstructing right lower pole nephrolithiasis. Stomach/Bowel: Negative for bowel obstruction, significant dilatation, ileus, or free air. In the pelvis, the left lower quadrant drain has retracted into the pericolonic fat. There remains a pericolonic sigmoid diverticular abscess measuring 3.5 x 4.8 cm with air-fluid level. There is also dependent pelvic fluid with peripheral enhancement draped around the uterus measuring up to 9.5 x 12.2 cm, also suspicious for abscess. Similar acute diverticulitis in the left lower quadrant sigmoid colon with surrounding inflammation and wall thickening. Vascular/Lymphatic: Extensive aortic atherosclerosis. Negative for aneurysm. No retroperitoneal hemorrhage or hematoma. Mesenteric and renal vasculature appear patent. No veno-occlusive process. No bulky adenopathy. Reproductive: Calcified uterine fibroids noted. Other: No inguinal or abdominal wall hernia. Diffuse body wall edema compatible with anasarca. Musculoskeletal: Degenerative changes of the spine. No acute osseous finding. IMPRESSION: Persistent acute left lower quadrant sigmoid diverticulitis with 2 pelvic abscesses as  above. Left lower quadrant abscess drain has retracted into the pericolonic fat. No associated obstruction or ileus Developing small right pleural effusion with mild right lower lobe compressive atelectasis. Electronically Signed   By: Jerilynn Mages.  Shick M.D.   On: 02/02/2019 12:29   Ct Image Guided Fluid Drain By Catheter  Result Date: 02/03/2019 INDICATION: Diverticular left lower quadrant pelvic abscesses EXAM: CT DRAIN PELVIC ABSCESS (TRANS GLUTEAL APPROACH) CT DRAIN LEFT LOWER QUADRANT ABSCESS MEDICATIONS: The patient is currently admitted to the hospital and receiving intravenous antibiotics. The antibiotics were administered within an appropriate time frame prior to the initiation of the procedure. ANESTHESIA/SEDATION: Fentanyl 4 mcg IV; Versed 200 mg IV Moderate Sedation Time:  79 MINUTES The patient was continuously monitored during the procedure by the interventional radiology nurse under my direct supervision. COMPLICATIONS: None immediate. PROCEDURE: Informed written consent was obtained from the patient after a thorough discussion of the procedural risks, benefits and alternatives. All questions were addressed. Maximal Sterile Barrier Technique was utilized including caps, mask, sterile gowns, sterile gloves, sterile drape, hand hygiene and skin antiseptic. A timeout was performed prior to the initiation of the procedure. CT PELVIC ABSCESS DRAIN: Previous imaging reviewed. Patient position prone. Noncontrast localization CT performed. The pelvic fluid collection surrounding the uterus was localized. Overlying skin marked for a left trans gluteal approach. Under  sterile conditions and local anesthesia, an 18 gauge access needle was advanced from a left trans gluteal approach into the fluid collection. Needle position confirmed with CT. Syringe aspiration yielded purulent fluid. Guidewire advanced followed by tract dilatation to insert a 10 [place] drain. Drain catheter position confirmed with CT. Syringe  aspiration yielded 30 cc exudative fluid. Sample sent for culture. Catheter secured with Prolene suture and connected to external suction bulb. Sterile dressing applied. No immediate complication. Patient tolerated the procedure well. CT LEFT LOWER QUADRANT ABSCESS DRAIN: Patient was repositioned supine. Noncontrast localization CT performed. The left lower quadrant abscess adjacent to the sigmoid colon was localized and marked for a left anterior oblique approach. Under sterile conditions and local anesthesia, an 18 gauge 15 cm access needle was advanced from a left anterior oblique approach into the left lower quadrant abscess. Syringe aspiration yielded fecal contaminated fluid. Sample sent for culture. Guidewire inserted followed by tract dilatation to insert a 10 [place] drain. Drain catheter position confirmed with CT. Catheter secured with a Prolene suture and connected to external suction bulb. Sterile dressing applied. No immediate complication. Patient tolerated the procedure well. IMPRESSION: Successful CT abscess drains within the pelvic abscess and within the left lower quadrant abscess as above. Electronically Signed   By: Jerilynn Mages.  Shick M.D.   On: 02/03/2019 10:58   Ct Image Guided Fluid Drain By Catheter  Result Date: 02/03/2019 INDICATION: Diverticular left lower quadrant pelvic abscesses EXAM: CT DRAIN PELVIC ABSCESS (TRANS GLUTEAL APPROACH) CT DRAIN LEFT LOWER QUADRANT ABSCESS MEDICATIONS: The patient is currently admitted to the hospital and receiving intravenous antibiotics. The antibiotics were administered within an appropriate time frame prior to the initiation of the procedure. ANESTHESIA/SEDATION: Fentanyl 4 mcg IV; Versed 200 mg IV Moderate Sedation Time:  63 MINUTES The patient was continuously monitored during the procedure by the interventional radiology nurse under my direct supervision. COMPLICATIONS: None immediate. PROCEDURE: Informed written consent was obtained from the patient after  a thorough discussion of the procedural risks, benefits and alternatives. All questions were addressed. Maximal Sterile Barrier Technique was utilized including caps, mask, sterile gowns, sterile gloves, sterile drape, hand hygiene and skin antiseptic. A timeout was performed prior to the initiation of the procedure. CT PELVIC ABSCESS DRAIN: Previous imaging reviewed. Patient position prone. Noncontrast localization CT performed. The pelvic fluid collection surrounding the uterus was localized. Overlying skin marked for a left trans gluteal approach. Under sterile conditions and local anesthesia, an 18 gauge access needle was advanced from a left trans gluteal approach into the fluid collection. Needle position confirmed with CT. Syringe aspiration yielded purulent fluid. Guidewire advanced followed by tract dilatation to insert a 10 [place] drain. Drain catheter position confirmed with CT. Syringe aspiration yielded 30 cc exudative fluid. Sample sent for culture. Catheter secured with Prolene suture and connected to external suction bulb. Sterile dressing applied. No immediate complication. Patient tolerated the procedure well. CT LEFT LOWER QUADRANT ABSCESS DRAIN: Patient was repositioned supine. Noncontrast localization CT performed. The left lower quadrant abscess adjacent to the sigmoid colon was localized and marked for a left anterior oblique approach. Under sterile conditions and local anesthesia, an 18 gauge 15 cm access needle was advanced from a left anterior oblique approach into the left lower quadrant abscess. Syringe aspiration yielded fecal contaminated fluid. Sample sent for culture. Guidewire inserted followed by tract dilatation to insert a 10 [place] drain. Drain catheter position confirmed with CT. Catheter secured with a Prolene suture and connected to external suction bulb. Sterile  dressing applied. No immediate complication. Patient tolerated the procedure well. IMPRESSION: Successful CT  abscess drains within the pelvic abscess and within the left lower quadrant abscess as above. Electronically Signed   By: Jerilynn Mages.  Shick M.D.   On: 02/03/2019 10:58   Vas [place] Lower Extremity Venous (dvt)  Result Date: 02/01/2019  Lower Venous Study Indications: Swelling.  Risk Factors: None identified. Comparison Study: No prior studies. Performing Technologist: Oliver Hum RVT  Examination Guidelines: A complete evaluation includes B-mode imaging, spectral Doppler, color Doppler, and power Doppler as needed of all accessible portions of each vessel. Bilateral testing is considered an integral part of a complete examination. Limited examinations for reoccurring indications may be performed as noted.  +---------+---------------+---------+-----------+----------+-------+  RIGHT     Compressibility Phasicity Spontaneity Properties Summary  +---------+---------------+---------+-----------+----------+-------+  CFV       Full            Yes       Yes                             +---------+---------------+---------+-----------+----------+-------+  SFJ       Full                                                      +---------+---------------+---------+-----------+----------+-------+  FV Prox   Full                                                      +---------+---------------+---------+-----------+----------+-------+  FV Mid    Full                                                      +---------+---------------+---------+-----------+----------+-------+  FV Distal Full                                                      +---------+---------------+---------+-----------+----------+-------+  PFV       Full                                                      +---------+---------------+---------+-----------+----------+-------+  POP       Full            Yes       Yes                             +---------+---------------+---------+-----------+----------+-------+  PTV       Full                                                       +---------+---------------+---------+-----------+----------+-------+  PERO      Full                                                      +---------+---------------+---------+-----------+----------+-------+   +----+---------------+---------+-----------+----------+-------+  LEFT Compressibility Phasicity Spontaneity Properties Summary  +----+---------------+---------+-----------+----------+-------+  CFV  Full            Yes       Yes                             +----+---------------+---------+-----------+----------+-------+     Summary: Right: There is no evidence of deep vein thrombosis in the lower extremity. No cystic structure found in the popliteal fossa. Left: No evidence of common femoral vein obstruction.  *See table(s) above for measurements and observations. Electronically signed by Monica Martinez MD on 02/01/2019 at 2:20:18 PM.    Final    Vas [place] Upper Extremity Venous Duplex  Result Date: 02/01/2019 UPPER VENOUS STUDY  Indications: Swelling Risk Factors: None identified. Comparison Study: No prior studies. Performing Technologist: Oliver Hum RVT  Examination Guidelines: A complete evaluation includes B-mode imaging, spectral Doppler, color Doppler, and power Doppler as needed of all accessible portions of each vessel. Bilateral testing is considered an integral part of a complete examination. Limited examinations for reoccurring indications may be performed as noted.  Right Findings: +----------+------------+---------+-----------+----------+-------+  RIGHT      Compressible Phasicity Spontaneous Properties Summary  +----------+------------+---------+-----------+----------+-------+  IJV            Full        Yes        Yes                         +----------+------------+---------+-----------+----------+-------+  Subclavian     Full        Yes        Yes                         +----------+------------+---------+-----------+----------+-------+  Axillary       Full        Yes        Yes                          +----------+------------+---------+-----------+----------+-------+  Brachial       Full        Yes        Yes                         +----------+------------+---------+-----------+----------+-------+  Radial         Full                                               +----------+------------+---------+-----------+----------+-------+  Ulnar          Full                                               +----------+------------+---------+-----------+----------+-------+  Cephalic       Full                                               +----------+------------+---------+-----------+----------+-------+  Basilic        Full                                               +----------+------------+---------+-----------+----------+-------+  Left Findings: +----------+------------+---------+-----------+----------+-------+  LEFT       Compressible Phasicity Spontaneous Properties Summary  +----------+------------+---------+-----------+----------+-------+  Subclavian     Full        Yes        Yes                         +----------+------------+---------+-----------+----------+-------+  Summary:  Right: No evidence of deep vein thrombosis in the upper extremity. No evidence of superficial vein thrombosis in the upper extremity.  Left: No evidence of thrombosis in the subclavian.  *See table(s) above for measurements and observations.  Diagnosing physician: Monica Martinez MD Electronically signed by Monica Martinez MD on 02/01/2019 at 2:19:58 PM.    Final     Labs:  CBC: Recent Labs    02/01/19 0527 02/02/19 0445 02/03/19 0117 02/04/19 0834  WBC 15.9* 17.4* 18.0* 14.5*  HGB 11.0* 11.5* 11.0* 10.7*  HCT 33.1* 34.2* 33.1* 32.4*  PLT 284 296 290 295    COAGS: Recent Labs    01/26/19 2032 01/28/19 0542 02/03/19 0117  INR 1.0 1.3* 1.2    BMP: Recent Labs    02/01/19 0527 02/02/19 0445 02/03/19 0117 02/04/19 0834  NA 136 135 131* 135  K 3.0* 3.5 3.1* 3.4*  CL 99 97* 94* 97*  CO2 26  28 27 26   GLUCOSE 92 96 83 78  BUN <5* <5* <5* <5*  CALCIUM 7.7* 7.9* 7.9* 8.0*  CREATININE 0.42* 0.50 0.50 0.53  GFRNONAA >60 >60 >60 >60  GFRAA >60 >60 >60 >60    LIVER FUNCTION TESTS: Recent Labs    01/27/19 0718 01/28/19 0542 01/29/19 0557 01/30/19 0224  BILITOT 0.6 0.4 0.2* 0.6  AST 10* 12* 10* 9*  ALT 12 11 9 8   ALKPHOS 53 63 69 66  PROT 5.9* 6.3* 5.5* 5.2*  ALBUMIN 2.8* 3.0* 2.2* 2.1*    Assessment and Plan: Diverticular abscess s/p drain placement 6/29, for replacement of LLQ drain and new placement of L TG drain 7/4. LLQ and TG drains in place.  WBC improved, 14.5 today. Afebrile. Subjectively feeling better. Cultures pending.  Continue routine drain care.  IR following.   Electronically Signed: Docia Barrier, PA 02/04/2019, 2:26 PM   I spent a total of 15 Minutes at the the patient's bedside AND on the patient's hospital floor or unit, greater than 50% of which was counseling/coordinating care for LLQ abscess drain

## 2019-02-04 NOTE — Progress Notes (Signed)
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 sae pcp 1 day PTA  For ongoing diarrhea and abdominal pain X5 DAYS-and was diagnosed clinically as diverticulitis and started on Augmentin and Flagyl. Patient says that she did not feel better and in fact her bloating got worse with significant worsening of abdominal pain.   In the ER CT scan of the abdomen pelvis showed acute complicated sigmoid diverticulitis- 3.8 cm abscess adjacent to sigmoid colon. Small to moderate volume of fluid within the pelvis with questionable rim enhancement.  General surgery  [facility]S was consulted advised IV antibiotics and IR drainage of the abscess and was admitted.  Patient had low-grade fever and nonbloody diarrhea. 7/2: Patient is slowly improving, tolerating diet having bowel movements. 7/3- feels bloated, wbc trended up. Ct planned 7/4: Standing leukocytosis, IR guided CT drain placement again on left gluteal, had LLQ drain too.  Subjective: Feels abdominal discomfort is improving, having bowel movement.  No nausea vomiting or fever.  Drains having good output  Assessment & Plan:  Acute sigmoid diverticulitis with abscess: With 4-5 prior episodes of diverticulitis that did not require hospitalization.  Colonoscopy previously with diverticulosis.  Status post drain placement 6/29 for abscess.  WBC was trending up, repeat CT 7/3 persistent acute left lower quadrant sigmoid diverticulitis with 2 pelvic abscesses, left lower quadrant abscess drain was retracted into the pericolonic fat.  S/p replacement of  LLQ drain and new pelvic drain 7/4, drain output:163 ml total.  Leukocytosis downtrending.  Continue on current Zosyn and fluconazoleDrain culture growing  pansensitive E. Coli/ viridan strep and candida. Cont diet as per general surgery appreciate input.   Chronic diastolic CHF: Compensated. Pt retaining fluid but no shortness of breath. Back on HCTZ losartan.  Monitor weight. I/o shows +13litre. Can try prn lasix if further wt gain or symptomatic.  Swelling of the right upper extremity/and right lower leg.  Swelling is improving. negative for DVT in duplex. I have resumed  HCTZ 7/1. Cut dnow ivf to 30 ml/hr-monitor.    Anxiety/depression on home Xanax, buspirone, Cymbalta and trazodone.  Resumed home meds. Continue PRN Ativan/Xanax.   Essential hypertension : BP fairly controlled on Metoprolol, HCTZ losartan  CAD with prior stent: Cont asa.  Prediabetes: A1c 5.8.  We will advise her to follow-up with PCP, for now continue on diet control.  HLD:  on statins  OSA: On Nocturnal CPAP but patient has been refusing.    Hypokalemia : We will better.  Add kcl in ivf.  DVT prophylaxis:  High risk for DVT. 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 Disposition Plan: Remains inpatient pending clinical improvement.   Consultants:  CCS  Procedures:  DRAIN PLACEMENT FOR ABSCESS Drain replacement and new drain ln left gluteal 7/4  Antimicrobials: Anti-infectives (From admission, onward)   Start     Dose/Rate Route Frequency Ordered Stop   02/02/19 1415  fluconazole (DIFLUCAN) IVPB 400 mg     400 mg 100 mL/hr over 120 Minutes Intravenous Every 24 hours 02/02/19 1408     01/30/19 1200  piperacillin-tazobactam (ZOSYN) IVPB  3.375 g     3.375 g 12.5 mL/hr over 240 Minutes Intravenous Every 8 hours 01/30/19 1045     01/27/19 1015  ciprofloxacin (CIPRO) IVPB 400 mg  Status:  Discontinued     400 mg 200 mL/hr over 60 Minutes Intravenous Every 12 hours 01/27/19 0117 01/30/19 1039     01/27/19 0700  metroNIDAZOLE (FLAGYL) IVPB 500 mg  Status:  Discontinued     500 mg 100 mL/hr over 60 Minutes Intravenous Every 8 hours 01/27/19 0117 01/30/19 1039   01/26/19 2245  ciprofloxacin (CIPRO) IVPB 400 mg     400 mg 200 mL/hr over 60 Minutes Intravenous  Once 01/26/19 2232 01/27/19 0026   01/26/19 2245  metroNIDAZOLE (FLAGYL) IVPB 500 mg     500 mg 100 mL/hr over 60 Minutes Intravenous  Once 01/26/19 2232 01/27/19 0010       Objective: Vitals:   02/03/19 1300 02/03/19 2027 02/04/19 0500 02/04/19 0558  BP: (!) 143/70 136/67  (!) 153/73  Pulse: 60 (!) 56  61  Resp: 16 18  18   Temp: 98.6 F (37 C) 99.1 F (37.3 C)  98.4 F (36.9 C)  TempSrc: Oral Oral  Oral  SpO2: 95% 96%  97%  Weight:   97.8 kg   Height:        Intake/Output Summary (Last 24 hours) at 02/04/2019 1003 Last data filed at 02/04/2019 0553 Gross per 24 hour  Intake 1304.14 ml  Output 463 ml  Net 841.14 ml   Filed Weights   01/27/19 0138 01/28/19 1413 02/04/19 0500  Weight: 92.3 kg 99.3 kg 97.8 kg   Weight change:   Body mass index is 38.19 kg/m.  Intake/Output from previous day: 07/04 0701 - 07/05 0700 In: 1304.1 [P.O.:400; I.V.:680.1; IV Piggyback:204.1] Out: 463 [Urine:300; Drains:163] Intake/Output this shift: No intake/output data recorded.  Examination: General exam: Alert awake oriented, not in acute distress, comfortable.  Obese.  HEENT:Oral mucosa moist, Ear/Nose WNL grossly, dentition normal. Respiratory system: Bilateral clear air entry no wheezing or crackles.   Cardiovascular system: regular rate and rhythm, S1 & S2 heard, No JVD/murmurs. Gastrointestinal system: Abdomen soft, mildly distended/obese, tenderness in the left lower quadrant.  She has a drain coming out x2  Nervous System:Alert, awake and oriented at baseline. Able to move UE and LE, sensation intact. Extremities:  UE/LE edema, distal peripheral pulses palpable.  Skin: No rashes,no icterus. MSK: Normal muscle  bulk,tone, power  Medications:  Scheduled Meds:  aspirin EC  325 mg Oral Daily   busPIRone  15 mg Oral TID   docusate sodium  100 mg Oral Daily   DULoxetine  60 mg Oral Daily   guaiFENesin  600 mg Oral BID   losartan  100 mg Oral Daily   And   hydrochlorothiazide  25 mg Oral Daily   metoprolol tartrate  25 mg Oral BID   rosuvastatin  10 mg Oral Daily   sodium chloride flush  5 mL Intracatheter Q8H   sodium chloride flush  5 mL Intracatheter Q8H   Continuous Infusions:  sodium chloride 30 mL/hr at 02/04/19 0300   fluconazole (DIFLUCAN) IV 400 mg (02/04/19 1001)   piperacillin-tazobactam (ZOSYN)  IV 3.375 g (02/04/19 0430)    Data Reviewed: I have personally reviewed following labs and imaging studies  CBC: Recent Labs  Lab 01/29/19 0557 01/30/19 0224 01/31/19 0630 02/01/19 0527 02/02/19 0445 02/03/19 0117 02/04/19 0834  WBC 19.1* 16.9* 15.2* 15.9* 17.4* 18.0* 14.5*  NEUTROABS 16.7* 14.2*  --   --   --   --   --  HGB 11.4* 11.5* 11.3* 11.0* 11.5* 11.0* 10.7*  HCT 34.3* 35.6* 34.8* 33.1* 34.2* 33.1* 32.4*  MCV 85.8 86.4 85.7 84.0 84.2 84.0 83.7  PLT 240 271 269 284 296 290 947   Basic Metabolic Panel: Recent Labs  Lab 01/30/19 0224 01/31/19 0630 02/01/19 0527 02/02/19 0445 02/03/19 0117 02/04/19 0834  NA 135  --  136 135 131* 135  K 4.4  --  3.0* 3.5 3.1* 3.4*  CL 101  --  99 97* 94* 97*  CO2 27  --  26 28 27 26   GLUCOSE 113*  --  92 96 83 78  BUN 6  --  <5* <5* <5* <5*  CREATININE 0.60  --  0.42* 0.50 0.50 0.53  CALCIUM 7.8*  --  7.7* 7.9* 7.9* 8.0*  MG 2.0 2.2 1.9 1.7 1.7  --    GFR: Estimated Creatinine Clearance: 85.4 mL/min (by C-G formula based on SCr of 0.53 mg/dL). Liver Function Tests: Recent Labs  Lab 01/29/19 0557 01/30/19 0224  AST 10* 9*  ALT 9 8  ALKPHOS 69 66  BILITOT 0.2* 0.6  PROT 5.5* 5.2*  ALBUMIN 2.2* 2.1*   No results for input(s): LIPASE, AMYLASE in the last 168 hours. No results for input(s): AMMONIA in  the last 168 hours. Coagulation Profile: Recent Labs  Lab 02/03/19 0117  INR 1.2   Cardiac Enzymes: No results for input(s): CKTOTAL, CKMB, CKMBINDEX, TROPONINI in the last 168 hours. BNP (last 3 results) No results for input(s): PROBNP in the last 8760 hours. HbA1C: No results for input(s): HGBA1C in the last 72 hours. CBG: No results for input(s): GLUCAP in the last 168 hours. Lipid Profile: No results for input(s): CHOL, HDL, LDLCALC, TRIG, CHOLHDL, LDLDIRECT in the last 72 hours. Thyroid Function Tests: No results for input(s): TSH, T4TOTAL, FREET4, T3FREE, THYROIDAB in the last 72 hours. Anemia Panel: No results for input(s): VITAMINB12, FOLATE, FERRITIN, TIBC, IRON, RETICCTPCT in the last 72 hours. Sepsis Labs: No results for input(s): PROCALCITON, LATICACIDVEN in the last 168 hours.  Recent Results (from the past 240 hour(s))  SARS Coronavirus 2 (CEPHEID - Performed in [facility] lab), Hosp Order     Status: None   Collection Time: 01/26/19 10:40 PM   Specimen: Nasopharyngeal Swab  Result Value Ref Range Status   SARS Coronavirus 2 NEGATIVE NEGATIVE Final    Comment: (NOTE) If result is NEGATIVE SARS-CoV-2 target nucleic acids are NOT DETECTED. The SARS-CoV-2 RNA is generally detectable in upper and lower  respiratory specimens during the acute phase of infection. The lowest  concentration of SARS-CoV-2 viral copies this assay can detect is 250  copies / mL. A negative result does not preclude SARS-CoV-2 infection  and should not be used as the sole basis for treatment or other  patient management decisions.  A negative result may occur with  improper specimen collection / handling, submission of specimen other  than nasopharyngeal swab, presence of viral mutation(s) within the  areas targeted by this assay, and inadequate number of viral copies  (<250 copies / mL). A negative result must be combined with clinical  observations, patient history, and  epidemiological information. If result is POSITIVE SARS-CoV-2 target nucleic acids are DETECTED. The SARS-CoV-2 RNA is generally detectable in upper and lower  respiratory specimens dur ing the acute phase of infection.  Positive  results are indicative of active infection with SARS-CoV-2.  Clinical  correlation with patient history and other diagnostic information is  necessary to determine  patient infection status.  Positive results do  not rule out bacterial infection or co-infection with other viruses. If result is PRESUMPTIVE POSTIVE SARS-CoV-2 nucleic acids MAY BE PRESENT.   A presumptive positive result was obtained on the submitted specimen  and confirmed on repeat testing.  While 2019 novel coronavirus  (SARS-CoV-2) nucleic acids may be present in the submitted sample  additional confirmatory testing may be necessary for epidemiological  and / or clinical management purposes  to differentiate between  SARS-CoV-2 and other Sarbecovirus currently known to infect humans.  If clinically indicated additional testing with an alternate test  methodology [phone]) is advised. The SARS-CoV-2 RNA is generally  detectable in upper and lower respiratory sp ecimens during the acute  phase of infection. The expected result is Negative. Fact Sheet for Patients:  StrictlyIdeas.no Fact Sheet for Healthcare Providers: BankingDealers.co.za This test is not yet approved or cleared by the [place] FDA and has been authorized for detection and/or diagnosis of SARS-CoV-2 by FDA under an Emergency Use Authorization (EUA).  This EUA will remain in effect (meaning this test can be used) for the duration of the COVID-19 declaration under Section 564(b)(1) of the Act, 21 U.S.C. section 360bbb-3(b)(1), unless the authorization is terminated or revoked sooner. Performed at [facility], [address], [place], [place] [address]   Aerobic/Anaerobic  Culture (surgical/deep wound)     Status: None   Collection Time: 01/29/19  2:19 PM   Specimen: Abscess  Result Value Ref Range Status   Specimen Description ABSCESS JP DRAINAGE  Final   Special Requests NONE  Final   Gram Stain   Final    ABUNDANT WBC PRESENT, PREDOMINANTLY PMN ABUNDANT GRAM POSITIVE COCCI    Culture   Final    FEW ESCHERICHIA COLI MODERATE VIRIDANS STREPTOCOCCUS FEW CANDIDA ALBICANS NO ANAEROBES ISOLATED Performed at [facility], [address] [place], [place] [address]    Report Status 02/03/2019 FINAL  Final   Organism ID, Bacteria ESCHERICHIA COLI  Final      Susceptibility   Escherichia coli - MIC*    AMPICILLIN <=2 SENSITIVE Sensitive     CEFAZOLIN <=4 SENSITIVE Sensitive     CEFEPIME <=1 SENSITIVE Sensitive     CEFTAZIDIME <=1 SENSITIVE Sensitive     CEFTRIAXONE <=1 SENSITIVE Sensitive     CIPROFLOXACIN <=0.25 SENSITIVE Sensitive     GENTAMICIN <=1 SENSITIVE Sensitive     IMIPENEM <=0.25 SENSITIVE Sensitive     TRIMETH/SULFA <=20 SENSITIVE Sensitive     AMPICILLIN/SULBACTAM <=2 SENSITIVE Sensitive     PIP/TAZO <=4 SENSITIVE Sensitive     Extended ESBL NEGATIVE Sensitive     * FEW ESCHERICHIA COLI  Aerobic/Anaerobic Culture (surgical/deep wound)     Status: None (Preliminary result)   Collection Time: 02/03/19 10:21 AM   Specimen: Pelvis; Abscess  Result Value Ref Range Status   Specimen Description PELVIS  Final   Special Requests Immunocompromised  Final   Gram Stain   Final    ABUNDANT WBC PRESENT, PREDOMINANTLY PMN RARE GRAM POSITIVE COCCI IN PAIRS IN CHAINS Performed at [facility], [address], [place], [place] [address]    Culture PENDING  Incomplete   Report Status PENDING  Incomplete  Aerobic/Anaerobic Culture (surgical/deep wound)     Status: None (Preliminary result)   Collection Time: 02/03/19 10:22 AM   Specimen: Abscess  Result Value Ref Range Status   Specimen Description ABSCESS  Final   Special Requests  Immunocompromised  Final  Gram Stain   Final    ABUNDANT WBC PRESENT, PREDOMINANTLY PMN ABUNDANT GRAM NEGATIVE RODS FEW GRAM POSITIVE COCCI IN CLUSTERS Performed at [facility], [place] [address] [place], [place] [address]    Culture PENDING  Incomplete   Report Status PENDING  Incomplete      Radiology Studies: Ct Image Guided Fluid Drain By Catheter  Result Date: 02/03/2019 INDICATION: Diverticular left lower quadrant pelvic abscesses EXAM: CT DRAIN PELVIC ABSCESS (TRANS GLUTEAL APPROACH) CT DRAIN LEFT LOWER QUADRANT ABSCESS MEDICATIONS: The patient is currently admitted to the hospital and receiving intravenous antibiotics. The antibiotics were administered within an appropriate time frame prior to the initiation of the procedure. ANESTHESIA/SEDATION: Fentanyl 4 mcg IV; Versed 200 mg IV Moderate Sedation Time:  13 MINUTES The patient was continuously monitored during the procedure by the interventional radiology nurse under my direct supervision. COMPLICATIONS: None immediate. PROCEDURE: Informed written consent was obtained from the patient after a thorough discussion of the procedural risks, benefits and alternatives. All questions were addressed. Maximal Sterile Barrier Technique was utilized including caps, mask, sterile gowns, sterile gloves, sterile drape, hand hygiene and skin antiseptic. A timeout was performed prior to the initiation of the procedure. CT PELVIC ABSCESS DRAIN: Previous imaging reviewed. Patient position prone. Noncontrast localization CT performed. The pelvic fluid collection surrounding the uterus was localized. Overlying skin marked for a left trans gluteal approach. Under sterile conditions and local anesthesia, an 18 gauge access needle was advanced from a left trans gluteal approach into the fluid collection. Needle position confirmed with CT. Syringe aspiration yielded purulent fluid. Guidewire advanced followed by tract dilatation to insert a 10 [place] drain.  Drain catheter position confirmed with CT. Syringe aspiration yielded 30 cc exudative fluid. Sample sent for culture. Catheter secured with Prolene suture and connected to external suction bulb. Sterile dressing applied. No immediate complication. Patient tolerated the procedure well. CT LEFT LOWER QUADRANT ABSCESS DRAIN: Patient was repositioned supine. Noncontrast localization CT performed. The left lower quadrant abscess adjacent to the sigmoid colon was localized and marked for a left anterior oblique approach. Under sterile conditions and local anesthesia, an 18 gauge 15 cm access needle was advanced from a left anterior oblique approach into the left lower quadrant abscess. Syringe aspiration yielded fecal contaminated fluid. Sample sent for culture. Guidewire inserted followed by tract dilatation to insert a 10 [place] drain. Drain catheter position confirmed with CT. Catheter secured with a Prolene suture and connected to external suction bulb. Sterile dressing applied. No immediate complication. Patient tolerated the procedure well. IMPRESSION: Successful CT abscess drains within the pelvic abscess and within the left lower quadrant abscess as above. Electronically Signed   By: Jerilynn Mages.  Shick M.D.   On: 02/03/2019 10:58   Ct Image Guided Fluid Drain By Catheter  Result Date: 02/03/2019 INDICATION: Diverticular left lower quadrant pelvic abscesses EXAM: CT DRAIN PELVIC ABSCESS (TRANS GLUTEAL APPROACH) CT DRAIN LEFT LOWER QUADRANT ABSCESS MEDICATIONS: The patient is currently admitted to the hospital and receiving intravenous antibiotics. The antibiotics were administered within an appropriate time frame prior to the initiation of the procedure. ANESTHESIA/SEDATION: Fentanyl 4 mcg IV; Versed 200 mg IV Moderate Sedation Time:  43 MINUTES The patient was continuously monitored during the procedure by the interventional radiology nurse under my direct supervision. COMPLICATIONS: None immediate. PROCEDURE: Informed  written consent was obtained from the patient after a thorough discussion of the procedural risks, benefits and alternatives. All questions were addressed. Maximal Sterile Barrier Technique was utilized including caps,  mask, sterile gowns, sterile gloves, sterile drape, hand hygiene and skin antiseptic. A timeout was performed prior to the initiation of the procedure. CT PELVIC ABSCESS DRAIN: Previous imaging reviewed. Patient position prone. Noncontrast localization CT performed. The pelvic fluid collection surrounding the uterus was localized. Overlying skin marked for a left trans gluteal approach. Under sterile conditions and local anesthesia, an 18 gauge access needle was advanced from a left trans gluteal approach into the fluid collection. Needle position confirmed with CT. Syringe aspiration yielded purulent fluid. Guidewire advanced followed by tract dilatation to insert a 10 [place] drain. Drain catheter position confirmed with CT. Syringe aspiration yielded 30 cc exudative fluid. Sample sent for culture. Catheter secured with Prolene suture and connected to external suction bulb. Sterile dressing applied. No immediate complication. Patient tolerated the procedure well. CT LEFT LOWER QUADRANT ABSCESS DRAIN: Patient was repositioned supine. Noncontrast localization CT performed. The left lower quadrant abscess adjacent to the sigmoid colon was localized and marked for a left anterior oblique approach. Under sterile conditions and local anesthesia, an 18 gauge 15 cm access needle was advanced from a left anterior oblique approach into the left lower quadrant abscess. Syringe aspiration yielded fecal contaminated fluid. Sample sent for culture. Guidewire inserted followed by tract dilatation to insert a 10 [place] drain. Drain catheter position confirmed with CT. Catheter secured with a Prolene suture and connected to external suction bulb. Sterile dressing applied. No immediate complication. Patient tolerated  the procedure well. IMPRESSION: Successful CT abscess drains within the pelvic abscess and within the left lower quadrant abscess as above. Electronically Signed   By: Jerilynn Mages.  Shick M.D.   On: 02/03/2019 10:58      LOS: 8 days   Time spent: More than 50% of that time was spent in counseling and/or coordination of care.  Antonieta Pert, MD [facility]  02/04/2019, 10:03 AM

## 2019-02-04 NOTE — Progress Notes (Signed)
Patient spoke with daughter Joellen [place] and provided an update on status.

## 2019-02-05 LAB — BASIC METABOLIC PANEL
Anion gap: 10 (ref 5–15)
BUN: <5 mg/dL — ABNORMAL LOW (ref 6–20)
CO2: 29 mmol/L (ref 22–32)
Calcium: 8.3 mg/dL — ABNORMAL LOW (ref 8.9–10.3)
Chloride: 96 mmol/L — ABNORMAL LOW (ref 98–111)
Creatinine, Ser: 0.58 mg/dL (ref 0.44–1.00)
GFR calc Af Amer: >60 mL/min (ref >60)
GFR calc non Af Amer: >60 mL/min (ref >60)
Glucose, Bld: 83 mg/dL (ref 70–99)
Potassium: 3.5 mmol/L (ref 3.5–5.1)
Sodium: 135 mmol/L (ref 135–145)

## 2019-02-05 LAB — CBC
HCT: 34.4 % — ABNORMAL LOW (ref 36.0–46.0)
Hemoglobin: 11.3 g/dL — ABNORMAL LOW (ref 12.0–15.0)
MCH: 27.8 pg (ref 26.0–34.0)
MCHC: 32.8 g/dL (ref 30.0–36.0)
MCV: 84.5 fL (ref 80.0–100.0)
Platelets: 295 10*3/uL (ref 150–400)
RBC: 4.07 MIL/uL (ref 3.87–5.11)
RDW: 13.5 % (ref 11.5–15.5)
WBC: 12.4 10*3/uL — ABNORMAL HIGH (ref 4.0–10.5)
nRBC: 0 % (ref 0.0–0.2)

## 2019-02-05 NOTE — Progress Notes (Signed)
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 sae pcp 1 day PTA  For ongoing diarrhea and abdominal pain X5 DAYS-and was diagnosed clinically as diverticulitis and started on Augmentin and Flagyl. Patient says that she did not feel better and in fact her bloating got worse with significant worsening of abdominal pain.   In the ER, CT scan of the abdomen pelvis showed acute complicated sigmoid diverticulitis- 3.8 cm abscess adjacent to sigmoid colon. Small to moderate volume of fluid within the pelvis with questionable rim enhancement.  General surgery  [facility]S was consulted advised IV antibiotics and IR drainage of the abscess and was admitted.  Patient had low-grade fever and nonbloody diarrhea. 7/2: Patient is slowly improving, tolerating diet having bowel movements. 7/3- feels bloated, wbc trended up. Ct planned 7/4: Standing leukocytosis, IR guided CT drain placement again on left gluteal, had LLQ drain too.  Subjective: Patient feeling much better.  No nausea vomiting or fever.  Pain is controlled on oral opiates. having bowel movements and tolerating diet.  Assessment & Plan:  Acute sigmoid diverticulitis with abscess: With 4-5 prior episodes of diverticulitis that did not require hospitalization.  Colonoscopy previously with diverticulosis. S/p dain placement 6/29 for abscess-WBC was trending up, repeat CT 7/3 persistent acute left lower quadrant sigmoid diverticulitis with 2 pelvic abscesses, left lower quadrant abscess drain was retracted into the pericolonic fat.  S/p replacement of  LLQ drain and new pelvic drain 7/4.  Drain output 63 mL in past 24 hours.  Patient IR and surgery input.  Once drain output is less than 10 mL in 24  hours plan to repeat CT scan abdomen.  Her leukocytosis resolved.  Clinically improving but slowly. continue on current Zosyn and fluconazole- first drain culture grew pansensitive E. Coli/ viridan strep and candida.  Drain culture 7/4 reintubated, Gram stain showed  GNR and GPC. Cont diet as per general surgery appreciate input.   Chronic diastolic CHF: Compensated. Pt retaining fluid but no shortness of breath. Continue her HCTZ.    Swelling of the right upper extremity/and right lower leg.  Swelling is improving-cont HCTZ.  negative for DVT in duplex. cut dnow ivf to 30 ml/hr-monitor. She has had ct w contrast and will need another.    Anxiety/depression on home Xanax, buspirone, Cymbalta and trazodone.  Resumed home meds. Continue PRN Ativan/Xanax.   Essential hypertension : BP fairly controlled on Metoprolol, HCTZ losartan  CAD with prior stent: Cont asa.  Prediabetes: A1c 5.8.  We will advise her to follow-up with PCP, for now continue on diet control.  HLD:  on statins  OSA: On Nocturnal CPAP but patient has been refusing.    Hypokalemia : We will better.  Add kcl in ivf.  DVT prophylaxis:  High risk for DVT. 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 Disposition Plan: Remains inpatient pending clinical improvement.   Consultants:  CCS  Procedures:  DRAIN PLACEMENT FOR ABSCESS Drain replacement and new drain ln left gluteal 7/4  Antimicrobials: Anti-infectives (From admission, onward)   Start     Dose/Rate Route Frequency  Ordered Stop   02/02/19 1415  fluconazole (DIFLUCAN) IVPB 400 mg     400 mg 100 mL/hr over 120 Minutes Intravenous Every 24 hours 02/02/19 1408     01/30/19 1200  piperacillin-tazobactam (ZOSYN) IVPB 3.375 g     3.375 g 12.5 mL/hr over 240 Minutes Intravenous Every 8 hours 01/30/19  1045     01/27/19 1015  ciprofloxacin (CIPRO) IVPB 400 mg  Status:  Discontinued     400 mg 200 mL/hr over 60 Minutes Intravenous Every 12 hours 01/27/19 0117 01/30/19 1039   01/27/19 0700  metroNIDAZOLE (FLAGYL) IVPB 500 mg  Status:  Discontinued     500 mg 100 mL/hr over 60 Minutes Intravenous Every 8 hours 01/27/19 0117 01/30/19 1039   01/26/19 2245  ciprofloxacin (CIPRO) IVPB 400 mg     400 mg 200 mL/hr over 60 Minutes Intravenous  Once 01/26/19 2232 01/27/19 0026   01/26/19 2245  metroNIDAZOLE (FLAGYL) IVPB 500 mg     500 mg 100 mL/hr over 60 Minutes Intravenous  Once 01/26/19 2232 01/27/19 0010       Objective: Vitals:   02/04/19 2019 02/05/19 0500 02/05/19 0531 02/05/19 0959  BP: (!) 169/58  (!) 141/74 (!) 156/68  Pulse: (!) 59  (!) 52 61  Resp: 18  18   Temp: 98.8 F (37.1 C)  97.8 F (36.6 C)   TempSrc: Oral  Oral   SpO2: 97%  94%   Weight:  97.8 kg    Height:        Intake/Output Summary (Last 24 hours) at 02/05/2019 1156 Last data filed at 02/05/2019 0900 Gross per 24 hour  Intake 863.48 ml  Output 368 ml  Net 495.48 ml   Filed Weights   01/28/19 1413 02/04/19 0500 02/05/19 0500  Weight: 99.3 kg 97.8 kg 97.8 kg   Weight change: 0 kg  Body mass index is 38.19 kg/m.  Intake/Output from previous day: 07/05 0701 - 07/06 0700 In: 383.5 [I.V.:101.4; IV Piggyback:262.1] Out: 368 [Urine:300; Drains:68] Intake/Output this shift: Total I/O In: 480 [P.O.:480] Out: -   Examination: General exam: Alert awake oriented, not in acute distress.  Obese build.   HEENT:Oral mucosa moist, Ear/Nose WNL grossly, dentition normal. Respiratory system: Bilateral clear air entry no wheezing or crackles.   Cardiovascular system: regular rate and rhythm, S1 & S2 heard, No JVD/murmurs. Gastrointestinal system: Abdomen soft, mildly distended/obese, tenderness in the left lower quadrant.  She has a drain coming out on left lower quadrant and wound on left gluteal region.    Nervous System:Alert, awake and oriented at baseline. Able to move UE and LE, sensation intact. Extremities:  UE/LE edema- mild, distal peripheral pulses palpable.  Skin: No rashes,no icterus. MSK: Normal muscle bulk,tone, power  Medications:  Scheduled Meds: . aspirin EC  325 mg Oral Daily  . busPIRone  15 mg Oral TID  . docusate sodium  100 mg Oral Daily  . DULoxetine  60 mg Oral Daily  . enoxaparin (LOVENOX) injection  40 mg Subcutaneous Q24H  . guaiFENesin  600 mg Oral BID  . losartan  100 mg Oral Daily   And  . hydrochlorothiazide  25 mg Oral Daily  . metoprolol tartrate  25 mg Oral BID  . rosuvastatin  10 mg Oral Daily  . sodium chloride flush  5 mL Intracatheter Q8H  . sodium chloride flush  5 mL Intracatheter Q8H   Continuous Infusions: . 0.9 % NaCl with KCl 20 mEq / L 30 mL/hr at  02/04/19 1121  . fluconazole (DIFLUCAN) IV 400 mg (02/05/19 1006)  . piperacillin-tazobactam (ZOSYN)  IV 3.375 g (02/05/19 1142)    Data Reviewed: I have personally reviewed following labs and imaging studies  CBC: Recent Labs  Lab 01/30/19 0224  02/01/19 0527 02/02/19 0445 02/03/19 0117 02/04/19 0834 02/05/19 0335  WBC 16.9*   < > 15.9* 17.4* 18.0* 14.5* 12.4*  NEUTROABS 14.2*  --   --   --   --   --   --   HGB 11.5*   < > 11.0* 11.5* 11.0* 10.7* 11.3*  HCT 35.6*   < > 33.1* 34.2* 33.1* 32.4* 34.4*  MCV 86.4   < > 84.0 84.2 84.0 83.7 84.5  PLT 271   < > 284 296 290 295 295   < > = values in this interval not displayed.   Basic Metabolic Panel: Recent Labs  Lab 01/30/19 0224 01/31/19 0630 02/01/19 0527 02/02/19 0445 02/03/19 0117 02/04/19 0834 02/05/19 0335  NA 135  --  136 135 131* 135 135  K 4.4  --  3.0* 3.5 3.1* 3.4* 3.5  CL 101  --  99 97* 94* 97* 96*  CO2 27  --  26 28 27 26 29   GLUCOSE 113*  --  92 96 83 78 83  BUN 6  --  <5* <5* <5* <5* <5*  CREATININE 0.60  --  0.42* 0.50 0.50 0.53 0.58  CALCIUM 7.8*  --  7.7* 7.9* 7.9* 8.0* 8.3*  MG 2.0 2.2 1.9 1.7 1.7  --    --    GFR: Estimated Creatinine Clearance: 85.4 mL/min (by C-G formula based on SCr of 0.58 mg/dL). Liver Function Tests: Recent Labs  Lab 01/30/19 0224  AST 9*  ALT 8  ALKPHOS 66  BILITOT 0.6  PROT 5.2*  ALBUMIN 2.1*   No results for input(s): LIPASE, AMYLASE in the last 168 hours. No results for input(s): AMMONIA in the last 168 hours. Coagulation Profile: Recent Labs  Lab 02/03/19 0117  INR 1.2   Cardiac Enzymes: No results for input(s): CKTOTAL, CKMB, CKMBINDEX, TROPONINI in the last 168 hours. BNP (last 3 results) No results for input(s): PROBNP in the last 8760 hours. HbA1C: No results for input(s): HGBA1C in the last 72 hours. CBG: No results for input(s): GLUCAP in the last 168 hours. Lipid Profile: No results for input(s): CHOL, HDL, LDLCALC, TRIG, CHOLHDL, LDLDIRECT in the last 72 hours. Thyroid Function Tests: No results for input(s): TSH, T4TOTAL, FREET4, T3FREE, THYROIDAB in the last 72 hours. Anemia Panel: No results for input(s): VITAMINB12, FOLATE, FERRITIN, TIBC, IRON, RETICCTPCT in the last 72 hours. Sepsis Labs: No results for input(s): PROCALCITON, LATICACIDVEN in the last 168 hours.  Recent Results (from the past 240 hour(s))  SARS Coronavirus 2 (CEPHEID - Performed in [place] hospital lab), Hosp Order     Status: None   Collection Time: 01/26/19 10:40 PM   Specimen: Nasopharyngeal Swab  Result Value Ref Range Status   SARS Coronavirus 2 NEGATIVE NEGATIVE Final    Comment: (NOTE) If result is NEGATIVE SARS-CoV-2 target nucleic acids are NOT DETECTED. The SARS-CoV-2 RNA is generally detectable in upper and lower  respiratory specimens during the acute phase of infection. The lowest  concentration of SARS-CoV-2 viral copies this assay can detect is 250  copies / mL. A negative result does not preclude SARS-CoV-2 infection  and should not be used as the sole basis for treatment or other  patient management decisions.  A negative result may  occur with  improper specimen collection / handling, submission of specimen other  than nasopharyngeal swab, presence of viral mutation(s) within the  areas targeted by this assay, and inadequate number of viral copies  (<250 copies / mL). A negative result must be combined with clinical  observations, patient history, and epidemiological information. If result is POSITIVE SARS-CoV-2 target nucleic acids are DETECTED. The SARS-CoV-2 RNA is generally detectable in upper and lower  respiratory specimens dur ing the acute phase of infection.  Positive  results are indicative of active infection with SARS-CoV-2.  Clinical  correlation with patient history and other diagnostic information is  necessary to determine patient infection status.  Positive results do  not rule out bacterial infection or co-infection with other viruses. If result is PRESUMPTIVE POSTIVE SARS-CoV-2 nucleic acids MAY BE PRESENT.   A presumptive positive result was obtained on the submitted specimen  and confirmed on repeat testing.  While 2019 novel coronavirus  (SARS-CoV-2) nucleic acids may be present in the submitted sample  additional confirmatory testing may be necessary for epidemiological  and / or clinical management purposes  to differentiate between  SARS-CoV-2 and other Sarbecovirus currently known to infect humans.  If clinically indicated additional testing with an alternate test  methodology [phone]) is advised. The SARS-CoV-2 RNA is generally  detectable in upper and lower respiratory sp ecimens during the acute  phase of infection. The expected result is Negative. Fact Sheet for Patients:  StrictlyIdeas.no Fact Sheet for Healthcare Providers: BankingDealers.co.za This test is not yet approved or cleared by the [place] FDA and has been authorized for detection and/or diagnosis of SARS-CoV-2 by FDA under an Emergency Use Authorization (EUA).  This  EUA will remain in effect (meaning this test can be used) for the duration of the COVID-19 declaration under Section 564(b)(1) of the Act, 21 U.S.C. section 360bbb-3(b)(1), unless the authorization is terminated or revoked sooner. Performed at [facility], [address], [place], [place] [address]   Aerobic/Anaerobic Culture (surgical/deep wound)     Status: None   Collection Time: 01/29/19  2:19 PM   Specimen: Abscess  Result Value Ref Range Status   Specimen Description ABSCESS JP DRAINAGE  Final   Special Requests NONE  Final   Gram Stain   Final    ABUNDANT WBC PRESENT, PREDOMINANTLY PMN ABUNDANT GRAM POSITIVE COCCI    Culture   Final    FEW ESCHERICHIA COLI MODERATE VIRIDANS STREPTOCOCCUS FEW CANDIDA ALBICANS NO ANAEROBES ISOLATED Performed at [facility], [address], [place], [place] [address]    Report Status 02/03/2019 FINAL  Final   Organism ID, Bacteria ESCHERICHIA COLI  Final      Susceptibility   Escherichia coli - MIC*    AMPICILLIN <=2 SENSITIVE Sensitive     CEFAZOLIN <=4 SENSITIVE Sensitive     CEFEPIME <=1 SENSITIVE Sensitive     CEFTAZIDIME <=1 SENSITIVE Sensitive     CEFTRIAXONE <=1 SENSITIVE Sensitive     CIPROFLOXACIN <=0.25 SENSITIVE Sensitive     GENTAMICIN <=1 SENSITIVE Sensitive     IMIPENEM <=0.25 SENSITIVE Sensitive     TRIMETH/SULFA <=20 SENSITIVE Sensitive     AMPICILLIN/SULBACTAM <=2 SENSITIVE Sensitive     PIP/TAZO <=4 SENSITIVE Sensitive     Extended ESBL NEGATIVE Sensitive     * FEW ESCHERICHIA COLI  Aerobic/Anaerobic Culture (surgical/deep wound)     Status: None (Preliminary result)   Collection Time: 02/03/19 10:21 AM   Specimen: Pelvis;  Abscess  Result Value Ref Range Status   Specimen Description PELVIS  Final   Special Requests Immunocompromised  Final   Gram Stain   Final    ABUNDANT WBC PRESENT, PREDOMINANTLY PMN RARE GRAM POSITIVE COCCI IN PAIRS IN CHAINS    Culture   Final    NO GROWTH < 24 HOURS Performed at  [facility], [address], [place], [place] [address]    Report Status PENDING  Incomplete  Aerobic/Anaerobic Culture (surgical/deep wound)     Status: None (Preliminary result)   Collection Time: 02/03/19 10:22 AM   Specimen: Abscess  Result Value Ref Range Status   Specimen Description ABSCESS  Final   Special Requests Immunocompromised  Final   Gram Stain   Final    ABUNDANT WBC PRESENT, PREDOMINANTLY PMN ABUNDANT GRAM NEGATIVE RODS FEW GRAM POSITIVE COCCI IN CLUSTERS    Culture   Final    CULTURE REINCUBATED FOR BETTER GROWTH Performed at [facility], [place] [address], [place], [place] [address]    Report Status PENDING  Incomplete      Radiology Studies: No results found.    LOS: 9 days   Time spent: More than 50% of that time was spent in counseling and/or coordination of care.  Antonieta Pert, MD [facility]  02/05/2019, 11:56 AM

## 2019-02-05 NOTE — Progress Notes (Addendum)
[facility]/Trauma Progress Note      Assessment/Plan Sigmoid diverticulitis with3.8 cmabscess, admission 06/27 - 4-5 prior episodes of diverticulitis, none that required hospitalization  - Last colonoscopy 2013 by Dr. Hilarie Fredrickson showed mild diverticulosis in the left colon -S/Pdrain placement06/44for the abscess, now s/p redrainage due to drain migration -Hopefully she will continue to improve and can plan for elective one-stage surgery later on once her acute flare up has resolved. If she does not improve and requires surgery this admission it will likely be colectomy/colostomy. -Continue drain, IV antibiotics. Will continue to follow. - am labs  ID -cipro/flagyl 6/26-06/30; Zosyn 06/30>>   WBC down to 12.4 VTE -SCDs, sq heparin FEN -FLD, oxy PO for pain Foley -none Follow up -Dr. Leighton Ruff   LOS: 9 days    Subjective: CC: abdominal pain  Pain overall improved. She is not drinking much water. She is not hungry. She is having BM's and flatus. She feels less distended. No fever or chills overnight. She gets nauseated with food. No vomiting   Objective: Vital signs in last 24 hours: Temp:  [97.8 F (36.6 C)-98.8 F (37.1 C)] 97.8 F (36.6 C) (07/06 0531) Pulse Rate:  [51-59] 52 (07/06 0531) Resp:  [16-18] 18 (07/06 0531) BP: (141-169)/(58-76) 141/74 (07/06 0531) SpO2:  [94 %-97 %] 94 % (07/06 0531) Weight:  [97.8 kg] 97.8 kg (07/06 0500) Last BM Date: 02/04/19  Intake/Output from previous day: 07/05 0701 - 07/06 0700 In: 383.5 [I.V.:101.4; IV Piggyback:262.1] Out: 368 [Urine:300; Drains:68] Intake/Output this shift: No intake/output data recorded.  PE: Gen: Alert, NAD, pleasant, cooperative Pulm:Rate andeffort normal Abd: Soft,mild distention, +BS,mild TTP in LLQ with guarding, no peritonitis, drain in LLQ with cloudy serosanguinous drainage, posterior drain with cloudy serous drainage Skin: no rashes noted, warm and dry Neuro:  alert and oriented.   Anti-infectives: Anti-infectives (From admission, onward)   Start     Dose/Rate Route Frequency Ordered Stop   02/02/19 1415  fluconazole (DIFLUCAN) IVPB 400 mg     400 mg 100 mL/hr over 120 Minutes Intravenous Every 24 hours 02/02/19 1408     01/30/19 1200  piperacillin-tazobactam (ZOSYN) IVPB 3.375 g     3.375 g 12.5 mL/hr over 240 Minutes Intravenous Every 8 hours 01/30/19 1045     01/27/19 1015  ciprofloxacin (CIPRO) IVPB 400 mg  Status:  Discontinued     400 mg 200 mL/hr over 60 Minutes Intravenous Every 12 hours 01/27/19 0117 01/30/19 1039   01/27/19 0700  metroNIDAZOLE (FLAGYL) IVPB 500 mg  Status:  Discontinued     500 mg 100 mL/hr over 60 Minutes Intravenous Every 8 hours 01/27/19 0117 01/30/19 1039   01/26/19 2245  ciprofloxacin (CIPRO) IVPB 400 mg     400 mg 200 mL/hr over 60 Minutes Intravenous  Once 01/26/19 2232 01/27/19 0026   01/26/19 2245  metroNIDAZOLE (FLAGYL) IVPB 500 mg     500 mg 100 mL/hr over 60 Minutes Intravenous  Once 01/26/19 2232 01/27/19 0010      Lab Results:  Recent Labs    02/04/19 0834 02/05/19 0335  WBC 14.5* 12.4*  HGB 10.7* 11.3*  HCT 32.4* 34.4*  PLT 295 295   BMET Recent Labs    02/04/19 0834 02/05/19 0335  NA 135 135  K 3.4* 3.5  CL 97* 96*  CO2 26 29  GLUCOSE 78 83  BUN <5* <5*  CREATININE 0.53 0.58  CALCIUM 8.0* 8.3*   PT/INR Recent Labs    02/03/19 0117  LABPROT 15.0  INR 1.2   CMP     Component Value Date/Time   NA 135 02/05/2019 0335   NA 140 01/25/2019 0924   K 3.5 02/05/2019 0335   CL 96 (L) 02/05/2019 0335   CO2 29 02/05/2019 0335   GLUCOSE 83 02/05/2019 0335   BUN <5 (L) 02/05/2019 0335   BUN 8 01/25/2019 0924   CREATININE 0.58 02/05/2019 0335   CALCIUM 8.3 (L) 02/05/2019 0335   PROT 5.2 (L) 01/30/2019 0224   PROT 6.8 01/25/2019 0924   ALBUMIN 2.1 (L) 01/30/2019 0224   ALBUMIN 4.0 01/25/2019 0924   AST 9 (L) 01/30/2019 0224   ALT 8 01/30/2019 0224   ALKPHOS 66 01/30/2019  0224   BILITOT 0.6 01/30/2019 0224   BILITOT 0.4 01/25/2019 0924   GFRNONAA >60 02/05/2019 0335   GFRAA >60 02/05/2019 0335   Lipase     Component Value Date/Time   LIPASE 26 01/26/2019 2032    Studies/Results: Ct Image Guided Fluid Drain By Catheter  Result Date: 02/03/2019 INDICATION: Diverticular left lower quadrant pelvic abscesses EXAM: CT DRAIN PELVIC ABSCESS (TRANS GLUTEAL APPROACH) CT DRAIN LEFT LOWER QUADRANT ABSCESS MEDICATIONS: The patient is currently admitted to the hospital and receiving intravenous antibiotics. The antibiotics were administered within an appropriate time frame prior to the initiation of the procedure. ANESTHESIA/SEDATION: Fentanyl 4 mcg IV; Versed 200 mg IV Moderate Sedation Time:  38 MINUTES The patient was continuously monitored during the procedure by the interventional radiology nurse under my direct supervision. COMPLICATIONS: None immediate. PROCEDURE: Informed written consent was obtained from the patient after a thorough discussion of the procedural risks, benefits and alternatives. All questions were addressed. Maximal Sterile Barrier Technique was utilized including caps, mask, sterile gowns, sterile gloves, sterile drape, hand hygiene and skin antiseptic. A timeout was performed prior to the initiation of the procedure. CT PELVIC ABSCESS DRAIN: Previous imaging reviewed. Patient position prone. Noncontrast localization CT performed. The pelvic fluid collection surrounding the uterus was localized. Overlying skin marked for a left trans gluteal approach. Under sterile conditions and local anesthesia, an 18 gauge access needle was advanced from a left trans gluteal approach into the fluid collection. Needle position confirmed with CT. Syringe aspiration yielded purulent fluid. Guidewire advanced followed by tract dilatation to insert a 10 [place] drain. Drain catheter position confirmed with CT. Syringe aspiration yielded 30 cc exudative fluid. Sample sent for  culture. Catheter secured with Prolene suture and connected to external suction bulb. Sterile dressing applied. No immediate complication. Patient tolerated the procedure well. CT LEFT LOWER QUADRANT ABSCESS DRAIN: Patient was repositioned supine. Noncontrast localization CT performed. The left lower quadrant abscess adjacent to the sigmoid colon was localized and marked for a left anterior oblique approach. Under sterile conditions and local anesthesia, an 18 gauge 15 cm access needle was advanced from a left anterior oblique approach into the left lower quadrant abscess. Syringe aspiration yielded fecal contaminated fluid. Sample sent for culture. Guidewire inserted followed by tract dilatation to insert a 10 [place] drain. Drain catheter position confirmed with CT. Catheter secured with a Prolene suture and connected to external suction bulb. Sterile dressing applied. No immediate complication. Patient tolerated the procedure well. IMPRESSION: Successful CT abscess drains within the pelvic abscess and within the left lower quadrant abscess as above. Electronically Signed   By: Jerilynn Mages.  Shick M.D.   On: 02/03/2019 10:58   Ct Image Guided Fluid Drain By Catheter  Result Date: 02/03/2019 INDICATION: Diverticular left lower quadrant pelvic abscesses  EXAM: CT DRAIN PELVIC ABSCESS (TRANS GLUTEAL APPROACH) CT DRAIN LEFT LOWER QUADRANT ABSCESS MEDICATIONS: The patient is currently admitted to the hospital and receiving intravenous antibiotics. The antibiotics were administered within an appropriate time frame prior to the initiation of the procedure. ANESTHESIA/SEDATION: Fentanyl 4 mcg IV; Versed 200 mg IV Moderate Sedation Time:  29 MINUTES The patient was continuously monitored during the procedure by the interventional radiology nurse under my direct supervision. COMPLICATIONS: None immediate. PROCEDURE: Informed written consent was obtained from the patient after a thorough discussion of the procedural risks, benefits  and alternatives. All questions were addressed. Maximal Sterile Barrier Technique was utilized including caps, mask, sterile gowns, sterile gloves, sterile drape, hand hygiene and skin antiseptic. A timeout was performed prior to the initiation of the procedure. CT PELVIC ABSCESS DRAIN: Previous imaging reviewed. Patient position prone. Noncontrast localization CT performed. The pelvic fluid collection surrounding the uterus was localized. Overlying skin marked for a left trans gluteal approach. Under sterile conditions and local anesthesia, an 18 gauge access needle was advanced from a left trans gluteal approach into the fluid collection. Needle position confirmed with CT. Syringe aspiration yielded purulent fluid. Guidewire advanced followed by tract dilatation to insert a 10 [place] drain. Drain catheter position confirmed with CT. Syringe aspiration yielded 30 cc exudative fluid. Sample sent for culture. Catheter secured with Prolene suture and connected to external suction bulb. Sterile dressing applied. No immediate complication. Patient tolerated the procedure well. CT LEFT LOWER QUADRANT ABSCESS DRAIN: Patient was repositioned supine. Noncontrast localization CT performed. The left lower quadrant abscess adjacent to the sigmoid colon was localized and marked for a left anterior oblique approach. Under sterile conditions and local anesthesia, an 18 gauge 15 cm access needle was advanced from a left anterior oblique approach into the left lower quadrant abscess. Syringe aspiration yielded fecal contaminated fluid. Sample sent for culture. Guidewire inserted followed by tract dilatation to insert a 10 [place] drain. Drain catheter position confirmed with CT. Catheter secured with a Prolene suture and connected to external suction bulb. Sterile dressing applied. No immediate complication. Patient tolerated the procedure well. IMPRESSION: Successful CT abscess drains within the pelvic abscess and within the left  lower quadrant abscess as above. Electronically Signed   By: Jerilynn Mages.  Shick M.D.   On: 02/03/2019 10:58      Kalman Drape , [facility] 02/05/2019, 8:09 AM  Pager: [phone] Mon-Wed, Friday 7:00am-4:30pm Thurs 7am-11:30am  Consults: [phone]

## 2019-02-05 NOTE — Progress Notes (Signed)
Referring Physician(s): Rolm Bookbinder  Supervising Physician: Markus Daft  Patient Status:  Heather Lam - In-pt  Chief Complaint: None  Subjective:  Diverticular abscesses x2 s/p LLQ drain placement 01/29/2019 by Dr. Laurence Ferrari (exchanged 02/03/2019 by Dr. Annamaria Boots) and left TG drain placement 02/03/2019 by Dr. Annamaria Boots. Patient awake and alert laying in bed with no complaints at this time. LLQ and left TG drain sites c/d/i.   Allergies: Patient has no known allergies.  Medications: Prior to Admission medications   Medication Sig Start Date End Date Taking? Authorizing Provider  ALPRAZolam (XANAX) 0.25 MG tablet Take 1 tablet (0.25 mg total) by mouth daily as needed for anxiety. 08/07/18  Yes Terald Sleeper, PA-C  aspirin EC 325 MG tablet Take 325 mg by mouth daily.   Yes [provider]  busPIRone (BUSPAR) 15 MG tablet Take 1 tablet (15 mg total) by mouth 3 (three) times daily. 08/07/18  Yes Terald Sleeper, PA-C  Cholecalciferol 2000 units CAPS Take 1 capsule (2,000 Units total) by mouth daily. 08/17/16  Yes Beasley, Caren D, MD  Coenzyme Q10 (CO Q10) 100 MG CAPS Take 1 capsule by mouth daily.   Yes [provider]  DULoxetine (CYMBALTA) 30 MG capsule Take 2 capsules (60 mg total) by mouth daily. 01/01/19  Yes Terald Sleeper, PA-C  losartan-hydrochlorothiazide (HYZAAR) 100-25 MG tablet Take 1 tablet by mouth daily. 07/24/18  Yes Terald Sleeper, PA-C  metoprolol tartrate (LOPRESSOR) 25 MG tablet TAKE 1 TABLET BY MOUTH 2 TIMES DAILY. Patient taking differently: Take 25 mg by mouth 2 (two) times daily.  10/24/18  Yes Terald Sleeper, PA-C  metroNIDAZOLE (FLAGYL) 500 MG tablet Take 1 tablet (500 mg total) by mouth 3 (three) times daily. 01/25/19  Yes Gottschalk, Ashly M, DO  nitroGLYCERIN (NITROSTAT) 0.4 MG SL tablet 1 TABLET UNDER TONGUE EVERY 5 MINUTES UP TO 3 TIMES FOR CHEST PAIN THEN CALL DR IF NO RELIEF Patient taking differently: Place 0.4 mg under the tongue every 5 (five) minutes  as needed for chest pain. 1 TABLET UNDER TONGUE EVERY 5 MINUTES UP TO 3 TIMES FOR CHEST PAIN THEN CALL DR IF NO RELIEF 02/23/18  Yes Terald Sleeper, PA-C  rosuvastatin (CRESTOR) 10 MG tablet Take 1 tablet (10 mg total) by mouth daily. 07/03/18  Yes Terald Sleeper, PA-C  terbinafine (LAMISIL) 250 MG tablet Take 1 tablet (250 mg total) by mouth daily. 12/19/18  Yes Terald Sleeper, PA-C  traZODone (DESYREL) 50 MG tablet Take 1-2 tablets (50-100 mg total) by mouth at bedtime as needed for sleep. 05/03/18  Yes Jones, Angel S, PA-C  VASCEPA 1 g CAPS TAKE 2 CAPULES BY MOUTH TWICE DAILY Patient taking differently: Take 2 capsules by mouth 2 (two) times a day.  07/11/18  Yes Terald Sleeper, PA-C  Vitamin D, Ergocalciferol, (DRISDOL) 1.25 MG (50000 UT) CAPS capsule Take 1 capsule (50,000 Units total) by mouth every 7 (seven) days. Patient taking differently: Take 50,000 Units by mouth every Sunday.  08/30/18  Yes Terald Sleeper, PA-C  econazole nitrate 1 % cream Apply topically 2 (two) times daily. 12/19/18   Terald Sleeper, PA-C     Vital Signs: BP (!) 156/68    Pulse 61    Temp 97.8 F (36.6 C) (Oral)    Resp 18    Ht 5\' 3"  (1.6 m)    Wt 215 lb 9.8 oz (97.8 kg)    LMP 12/31/2013    SpO2 94%  BMI 38.19 kg/m   Physical Exam Vitals signs and nursing note reviewed.  Constitutional:      General: She is not in acute distress.    Appearance: Normal appearance.  Pulmonary:     Effort: Pulmonary effort is normal. No respiratory distress.  Abdominal:     Comments: LLQ and left TG drain sites without tenderness, erythema, drainage, or active bleeding; minimal output of thick brown drainage in each JP drain (of note, patient states that the drains were emptied this AM prior to my arrival, chart reveals 68 cc output in last 24 hours); both drains flush/aspirate without resistance.  Skin:    General: Skin is warm and dry.  Neurological:     Mental Status: She is alert and oriented to person, place, and time.    Psychiatric:        Mood and Affect: Mood normal.        Behavior: Behavior normal.        Thought Content: Thought content normal.        Judgment: Judgment normal.     Imaging: Ct Abdomen Pelvis W Contrast  Result Date: 02/02/2019 CLINICAL DATA:  Diverticulitis with abscess status post drain 01/29/2019. Increasing white count. Persistent abdominal pain EXAM: CT ABDOMEN AND PELVIS WITH CONTRAST TECHNIQUE: Multidetector CT imaging of the abdomen and pelvis was performed using the standard protocol following bolus administration of intravenous contrast. CONTRAST:  122mL OMNIPAQUE IOHEXOL 300 MG/ML  SOLN COMPARISON:  None. FINDINGS: Lower chest: Increased small non loculated right effusion with right base compressive atelectasis. Normal heart size. Aorta atherosclerotic. Degenerative changes of lower thoracic spine. Hepatobiliary: No focal liver abnormality is seen. No gallstones, gallbladder wall thickening, or biliary dilatation. Pancreas: Unremarkable. No pancreatic ductal dilatation or surrounding inflammatory changes. Spleen: Normal in size without focal abnormality. Adrenals/Urinary Tract: Normal adrenal glands. Kidneys demonstrate no acute hydronephrosis, obstruction pattern, hydroureter, ureteral calculus or definite bladder abnormality. Bladder nondistended. Stable bilateral renal cysts, 1 with peripheral calcification in the left hilar region. Similar punctate nonobstructing right lower pole nephrolithiasis. Stomach/Bowel: Negative for bowel obstruction, significant dilatation, ileus, or free air. In the pelvis, the left lower quadrant drain has retracted into the pericolonic fat. There remains a pericolonic sigmoid diverticular abscess measuring 3.5 x 4.8 cm with air-fluid level. There is also dependent pelvic fluid with peripheral enhancement draped around the uterus measuring up to 9.5 x 12.2 cm, also suspicious for abscess. Similar acute diverticulitis in the left lower quadrant sigmoid colon  with surrounding inflammation and wall thickening. Vascular/Lymphatic: Extensive aortic atherosclerosis. Negative for aneurysm. No retroperitoneal hemorrhage or hematoma. Mesenteric and renal vasculature appear patent. No veno-occlusive process. No bulky adenopathy. Reproductive: Calcified uterine fibroids noted. Other: No inguinal or abdominal wall hernia. Diffuse body wall edema compatible with anasarca. Musculoskeletal: Degenerative changes of the spine. No acute osseous finding. IMPRESSION: Persistent acute left lower quadrant sigmoid diverticulitis with 2 pelvic abscesses as above. Left lower quadrant abscess drain has retracted into the pericolonic fat. No associated obstruction or ileus Developing small right pleural effusion with mild right lower lobe compressive atelectasis. Electronically Signed   By: Jerilynn Mages.  Shick M.D.   On: 02/02/2019 12:29   Ct Image Guided Fluid Drain By Catheter  Result Date: 02/03/2019 INDICATION: Diverticular left lower quadrant pelvic abscesses EXAM: CT DRAIN PELVIC ABSCESS (TRANS GLUTEAL APPROACH) CT DRAIN LEFT LOWER QUADRANT ABSCESS MEDICATIONS: The patient is currently admitted to the hospital and receiving intravenous antibiotics. The antibiotics were administered within an appropriate time frame prior to the  initiation of the procedure. ANESTHESIA/SEDATION: Fentanyl 4 mcg IV; Versed 200 mg IV Moderate Sedation Time:  11 MINUTES The patient was continuously monitored during the procedure by the interventional radiology nurse under my direct supervision. COMPLICATIONS: None immediate. PROCEDURE: Informed written consent was obtained from the patient after a thorough discussion of the procedural risks, benefits and alternatives. All questions were addressed. Maximal Sterile Barrier Technique was utilized including caps, mask, sterile gowns, sterile gloves, sterile drape, hand hygiene and skin antiseptic. A timeout was performed prior to the initiation of the procedure. CT PELVIC  ABSCESS DRAIN: Previous imaging reviewed. Patient position prone. Noncontrast localization CT performed. The pelvic fluid collection surrounding the uterus was localized. Overlying skin marked for a left trans gluteal approach. Under sterile conditions and local anesthesia, an 18 gauge access needle was advanced from a left trans gluteal approach into the fluid collection. Needle position confirmed with CT. Syringe aspiration yielded purulent fluid. Guidewire advanced followed by tract dilatation to insert a 10 [place] drain. Drain catheter position confirmed with CT. Syringe aspiration yielded 30 cc exudative fluid. Sample sent for culture. Catheter secured with Prolene suture and connected to external suction bulb. Sterile dressing applied. No immediate complication. Patient tolerated the procedure well. CT LEFT LOWER QUADRANT ABSCESS DRAIN: Patient was repositioned supine. Noncontrast localization CT performed. The left lower quadrant abscess adjacent to the sigmoid colon was localized and marked for a left anterior oblique approach. Under sterile conditions and local anesthesia, an 18 gauge 15 cm access needle was advanced from a left anterior oblique approach into the left lower quadrant abscess. Syringe aspiration yielded fecal contaminated fluid. Sample sent for culture. Guidewire inserted followed by tract dilatation to insert a 10 [place] drain. Drain catheter position confirmed with CT. Catheter secured with a Prolene suture and connected to external suction bulb. Sterile dressing applied. No immediate complication. Patient tolerated the procedure well. IMPRESSION: Successful CT abscess drains within the pelvic abscess and within the left lower quadrant abscess as above. Electronically Signed   By: Jerilynn Mages.  Shick M.D.   On: 02/03/2019 10:58   Ct Image Guided Fluid Drain By Catheter  Result Date: 02/03/2019 INDICATION: Diverticular left lower quadrant pelvic abscesses EXAM: CT DRAIN PELVIC ABSCESS (TRANS  GLUTEAL APPROACH) CT DRAIN LEFT LOWER QUADRANT ABSCESS MEDICATIONS: The patient is currently admitted to the hospital and receiving intravenous antibiotics. The antibiotics were administered within an appropriate time frame prior to the initiation of the procedure. ANESTHESIA/SEDATION: Fentanyl 4 mcg IV; Versed 200 mg IV Moderate Sedation Time:  4 MINUTES The patient was continuously monitored during the procedure by the interventional radiology nurse under my direct supervision. COMPLICATIONS: None immediate. PROCEDURE: Informed written consent was obtained from the patient after a thorough discussion of the procedural risks, benefits and alternatives. All questions were addressed. Maximal Sterile Barrier Technique was utilized including caps, mask, sterile gowns, sterile gloves, sterile drape, hand hygiene and skin antiseptic. A timeout was performed prior to the initiation of the procedure. CT PELVIC ABSCESS DRAIN: Previous imaging reviewed. Patient position prone. Noncontrast localization CT performed. The pelvic fluid collection surrounding the uterus was localized. Overlying skin marked for a left trans gluteal approach. Under sterile conditions and local anesthesia, an 18 gauge access needle was advanced from a left trans gluteal approach into the fluid collection. Needle position confirmed with CT. Syringe aspiration yielded purulent fluid. Guidewire advanced followed by tract dilatation to insert a 10 [place] drain. Drain catheter position confirmed with CT. Syringe aspiration yielded 30 cc exudative fluid. Sample  sent for culture. Catheter secured with Prolene suture and connected to external suction bulb. Sterile dressing applied. No immediate complication. Patient tolerated the procedure well. CT LEFT LOWER QUADRANT ABSCESS DRAIN: Patient was repositioned supine. Noncontrast localization CT performed. The left lower quadrant abscess adjacent to the sigmoid colon was localized and marked for a left  anterior oblique approach. Under sterile conditions and local anesthesia, an 18 gauge 15 cm access needle was advanced from a left anterior oblique approach into the left lower quadrant abscess. Syringe aspiration yielded fecal contaminated fluid. Sample sent for culture. Guidewire inserted followed by tract dilatation to insert a 10 [place] drain. Drain catheter position confirmed with CT. Catheter secured with a Prolene suture and connected to external suction bulb. Sterile dressing applied. No immediate complication. Patient tolerated the procedure well. IMPRESSION: Successful CT abscess drains within the pelvic abscess and within the left lower quadrant abscess as above. Electronically Signed   By: Jerilynn Mages.  Shick M.D.   On: 02/03/2019 10:58    Labs:  CBC: Recent Labs    02/02/19 0445 02/03/19 0117 02/04/19 0834 02/05/19 0335  WBC 17.4* 18.0* 14.5* 12.4*  HGB 11.5* 11.0* 10.7* 11.3*  HCT 34.2* 33.1* 32.4* 34.4*  PLT 296 290 295 295    COAGS: Recent Labs    01/26/19 2032 01/28/19 0542 02/03/19 0117  INR 1.0 1.3* 1.2    BMP: Recent Labs    02/02/19 0445 02/03/19 0117 02/04/19 0834 02/05/19 0335  NA 135 131* 135 135  K 3.5 3.1* 3.4* 3.5  CL 97* 94* 97* 96*  CO2 28 27 26 29   GLUCOSE 96 83 78 83  BUN <5* <5* <5* <5*  CALCIUM 7.9* 7.9* 8.0* 8.3*  CREATININE 0.50 0.50 0.53 0.58  GFRNONAA >60 >60 >60 >60  GFRAA >60 >60 >60 >60    LIVER FUNCTION TESTS: Recent Labs    01/27/19 0718 01/28/19 0542 01/29/19 0557 01/30/19 0224  BILITOT 0.6 0.4 0.2* 0.6  AST 10* 12* 10* 9*  ALT 12 11 9 8   ALKPHOS 53 63 69 66  PROT 5.9* 6.3* 5.5* 5.2*  ALBUMIN 2.8* 3.0* 2.2* 2.1*    Assessment and Plan:  Diverticular abscesses x2 s/p LLQ drain placement 01/29/2019 by Dr. Laurence Ferrari (exchanged 02/03/2019 by Dr. Annamaria Boots) and left TG drain placement 02/03/2019 by Dr. Annamaria Boots. LLQ and left TG drains stable with minimal output of thick brown drainage in each JP drain (of note, patient states that the  drains were emptied this AM prior to my arrival, chart reveals 68 cc output in last 24 hours). Continue with Qshift flushes/monitor of output. Plan for repeat CT/possible drain injection when output <10 cc/24 hours (per drain) to assess for possible removal. Further plans per [facility]- appreciate and agree with management. IR to follow.   Electronically Signed: Earley Abide, PA-C 02/05/2019, 11:24 AM   I spent a total of 25 Minutes at the the patient's bedside AND on the patient's hospital floor or unit, greater than 50% of which was counseling/coordinating care for diverticular abscesses x2 s/p drain placement x2.

## 2019-02-05 NOTE — Progress Notes (Signed)
Spoke with patient's daughter Joellen Jersey on phone and updated her on patient condition and answered her questions regarding dropping off items for patient.

## 2019-02-05 NOTE — Plan of Care (Signed)
  Problem: Pain Managment: Goal: General experience of comfort will improve Outcome: Progressing   Problem: Safety: Goal: Ability to remain free from injury will improve Outcome: Progressing   Problem: Skin Integrity: Goal: Risk for impaired skin integrity will decrease Outcome: Progressing   

## 2019-02-05 NOTE — Progress Notes (Signed)
Pharmacy Antibiotic Note  Heather Lam is a 58 y.o. female admitted on 01/26/2019 with sigmoid diverticulitis with abscess . Abscess is growing pan-S e.coli, strep viridans, and candida albicans. Cultures from abscess on 7/5 still growing GNR and Strep. Patient is day 11 of antibiotics, 7 of zosyn and day 3 of fluconazole.  Afebrile and WBC 12.4 (which is decreased). D/W MD, due to persistently positive cultures will continue current abx for now.   Plan: Continue zosyn 3.375gm IV q8h Continue fluconazole 400mg  IV q24h   Height: 5\' 3"  (160 cm) Weight: 215 lb 9.8 oz (97.8 kg) IBW/kg (Calculated) : 52.4  Temp (24hrs), Avg:98.3 F (36.8 C), Min:97.8 F (36.6 C), Max:98.8 F (37.1 C)  Recent Labs  Lab 02/01/19 0527 02/02/19 0445 02/03/19 0117 02/04/19 0834 02/05/19 0335  WBC 15.9* 17.4* 18.0* 14.5* 12.4*  CREATININE 0.42* 0.50 0.50 0.53 0.58    Estimated Creatinine Clearance: 85.4 mL/min (by C-G formula based on SCr of 0.58 mg/dL).    No Known Allergies  Antimicrobials this admission:  6/26 Cipro >> 6/30 6/26 Flagyl >> 6/30 6/30 Zosyn >>  7/4 Fluconazole >>  Dose adjustments this admission:  --  Microbiology results:  6/26 COVID: negative  6/29 abscess, JP drainage: Pan-S e.coli, strep viridans, and candida albicans 7/5: Abscess culture: GNR and GPC in chains   Heather Lam A. Levada Dy, PharmD, [place] Please utilize Amion for appropriate phone number to reach the unit pharmacist ([place])   02/05/2019 8:39 AM

## 2019-02-06 LAB — CBC
HCT: 35.1 % — ABNORMAL LOW (ref 36.0–46.0)
Hemoglobin: 11.5 g/dL — ABNORMAL LOW (ref 12.0–15.0)
MCH: 27.9 pg (ref 26.0–34.0)
MCHC: 32.8 g/dL (ref 30.0–36.0)
MCV: 85.2 fL (ref 80.0–100.0)
Platelets: 317 10*3/uL (ref 150–400)
RBC: 4.12 MIL/uL (ref 3.87–5.11)
RDW: 13.5 % (ref 11.5–15.5)
WBC: 13.1 10*3/uL — ABNORMAL HIGH (ref 4.0–10.5)
nRBC: 0 % (ref 0.0–0.2)

## 2019-02-06 LAB — BASIC METABOLIC PANEL
Anion gap: 9 (ref 5–15)
BUN: <5 mg/dL — ABNORMAL LOW (ref 6–20)
CO2: 30 mmol/L (ref 22–32)
Calcium: 8.8 mg/dL — ABNORMAL LOW (ref 8.9–10.3)
Chloride: 97 mmol/L — ABNORMAL LOW (ref 98–111)
Creatinine, Ser: 0.59 mg/dL (ref 0.44–1.00)
GFR calc Af Amer: >60 mL/min (ref >60)
GFR calc non Af Amer: >60 mL/min (ref >60)
Glucose, Bld: 82 mg/dL (ref 70–99)
Potassium: 4.4 mmol/L (ref 3.5–5.1)
Sodium: 136 mmol/L (ref 135–145)

## 2019-02-06 NOTE — Progress Notes (Signed)
Referring Physician(s): CCS  Supervising Physician: Markus Daft  Patient Status:  [facility] - In-pt  Chief Complaint:  Sigmoid diverticulitis Abscess LLW drain placed 6/29 Then replaced 7/4 and new TG drain placed 7/4  Subjective:  Feeling some better Will need surgery per CCS note  Allergies: Patient has no known allergies.  Medications: Prior to Admission medications   Medication Sig Start Date End Date Taking? Authorizing Provider  ALPRAZolam (XANAX) 0.25 MG tablet Take 1 tablet (0.25 mg total) by mouth daily as needed for anxiety. 08/07/18  Yes Terald Sleeper, PA-C  aspirin EC 325 MG tablet Take 325 mg by mouth daily.   Yes [provider]  busPIRone (BUSPAR) 15 MG tablet Take 1 tablet (15 mg total) by mouth 3 (three) times daily. 08/07/18  Yes Terald Sleeper, PA-C  Cholecalciferol 2000 units CAPS Take 1 capsule (2,000 Units total) by mouth daily. 08/17/16  Yes Beasley, Caren D, MD  Coenzyme Q10 (CO Q10) 100 MG CAPS Take 1 capsule by mouth daily.   Yes [provider]  DULoxetine (CYMBALTA) 30 MG capsule Take 2 capsules (60 mg total) by mouth daily. 01/01/19  Yes Terald Sleeper, PA-C  losartan-hydrochlorothiazide (HYZAAR) 100-25 MG tablet Take 1 tablet by mouth daily. 07/24/18  Yes Terald Sleeper, PA-C  metoprolol tartrate (LOPRESSOR) 25 MG tablet TAKE 1 TABLET BY MOUTH 2 TIMES DAILY. Patient taking differently: Take 25 mg by mouth 2 (two) times daily.  10/24/18  Yes Terald Sleeper, PA-C  metroNIDAZOLE (FLAGYL) 500 MG tablet Take 1 tablet (500 mg total) by mouth 3 (three) times daily. 01/25/19  Yes Gottschalk, Ashly M, DO  nitroGLYCERIN (NITROSTAT) 0.4 MG SL tablet 1 TABLET UNDER TONGUE EVERY 5 MINUTES UP TO 3 TIMES FOR CHEST PAIN THEN CALL DR IF NO RELIEF Patient taking differently: Place 0.4 mg under the tongue every 5 (five) minutes as needed for chest pain. 1 TABLET UNDER TONGUE EVERY 5 MINUTES UP TO 3 TIMES FOR CHEST PAIN THEN CALL DR IF NO RELIEF 02/23/18  Yes  Terald Sleeper, PA-C  rosuvastatin (CRESTOR) 10 MG tablet Take 1 tablet (10 mg total) by mouth daily. 07/03/18  Yes Terald Sleeper, PA-C  terbinafine (LAMISIL) 250 MG tablet Take 1 tablet (250 mg total) by mouth daily. 12/19/18  Yes Terald Sleeper, PA-C  traZODone (DESYREL) 50 MG tablet Take 1-2 tablets (50-100 mg total) by mouth at bedtime as needed for sleep. 05/03/18  Yes Jones, Angel S, PA-C  VASCEPA 1 g CAPS TAKE 2 CAPULES BY MOUTH TWICE DAILY Patient taking differently: Take 2 capsules by mouth 2 (two) times a day.  07/11/18  Yes Terald Sleeper, PA-C  Vitamin D, Ergocalciferol, (DRISDOL) 1.25 MG (50000 UT) CAPS capsule Take 1 capsule (50,000 Units total) by mouth every 7 (seven) days. Patient taking differently: Take 50,000 Units by mouth every Sunday.  08/30/18  Yes Terald Sleeper, PA-C  econazole nitrate 1 % cream Apply topically 2 (two) times daily. 12/19/18   Terald Sleeper, PA-C     Vital Signs: BP (!) 172/83 (BP Location: Right Arm)    Pulse 61    Temp 98 F (36.7 C) (Oral)    Resp 16    Ht 5\' 3"  (1.6 m)    Wt 205 lb 11 oz (93.3 kg)    LMP 12/31/2013    SpO2 98%    BMI 36.44 kg/m   Physical Exam Vitals signs reviewed.  Skin:    General:  Skin is warm and dry.     Comments: Sites are clean and dry NYT  flushing well Anterior drain -- bloody OP~30 cc TG drain cloudy yellow OP~30 cc NGTD-- re incubated  Neurological:     Mental Status: She is alert.     Imaging: Ct Abdomen Pelvis W Contrast  Result Date: 02/02/2019 CLINICAL DATA:  Diverticulitis with abscess status post drain 01/29/2019. Increasing white count. Persistent abdominal pain EXAM: CT ABDOMEN AND PELVIS WITH CONTRAST TECHNIQUE: Multidetector CT imaging of the abdomen and pelvis was performed using the standard protocol following bolus administration of intravenous contrast. CONTRAST:  116mL OMNIPAQUE IOHEXOL 300 MG/ML  SOLN COMPARISON:  None. FINDINGS: Lower chest: Increased small non loculated right effusion with  right base compressive atelectasis. Normal heart size. Aorta atherosclerotic. Degenerative changes of lower thoracic spine. Hepatobiliary: No focal liver abnormality is seen. No gallstones, gallbladder wall thickening, or biliary dilatation. Pancreas: Unremarkable. No pancreatic ductal dilatation or surrounding inflammatory changes. Spleen: Normal in size without focal abnormality. Adrenals/Urinary Tract: Normal adrenal glands. Kidneys demonstrate no acute hydronephrosis, obstruction pattern, hydroureter, ureteral calculus or definite bladder abnormality. Bladder nondistended. Stable bilateral renal cysts, 1 with peripheral calcification in the left hilar region. Similar punctate nonobstructing right lower pole nephrolithiasis. Stomach/Bowel: Negative for bowel obstruction, significant dilatation, ileus, or free air. In the pelvis, the left lower quadrant drain has retracted into the pericolonic fat. There remains a pericolonic sigmoid diverticular abscess measuring 3.5 x 4.8 cm with air-fluid level. There is also dependent pelvic fluid with peripheral enhancement draped around the uterus measuring up to 9.5 x 12.2 cm, also suspicious for abscess. Similar acute diverticulitis in the left lower quadrant sigmoid colon with surrounding inflammation and wall thickening. Vascular/Lymphatic: Extensive aortic atherosclerosis. Negative for aneurysm. No retroperitoneal hemorrhage or hematoma. Mesenteric and renal vasculature appear patent. No veno-occlusive process. No bulky adenopathy. Reproductive: Calcified uterine fibroids noted. Other: No inguinal or abdominal wall hernia. Diffuse body wall edema compatible with anasarca. Musculoskeletal: Degenerative changes of the spine. No acute osseous finding. IMPRESSION: Persistent acute left lower quadrant sigmoid diverticulitis with 2 pelvic abscesses as above. Left lower quadrant abscess drain has retracted into the pericolonic fat. No associated obstruction or ileus Developing  small right pleural effusion with mild right lower lobe compressive atelectasis. Electronically Signed   By: Jerilynn Mages.  Shick M.D.   On: 02/02/2019 12:29   Ct Image Guided Fluid Drain By Catheter  Result Date: 02/03/2019 INDICATION: Diverticular left lower quadrant pelvic abscesses EXAM: CT DRAIN PELVIC ABSCESS (TRANS GLUTEAL APPROACH) CT DRAIN LEFT LOWER QUADRANT ABSCESS MEDICATIONS: The patient is currently admitted to the hospital and receiving intravenous antibiotics. The antibiotics were administered within an appropriate time frame prior to the initiation of the procedure. ANESTHESIA/SEDATION: Fentanyl 4 mcg IV; Versed 200 mg IV Moderate Sedation Time:  43 MINUTES The patient was continuously monitored during the procedure by the interventional radiology nurse under my direct supervision. COMPLICATIONS: None immediate. PROCEDURE: Informed written consent was obtained from the patient after a thorough discussion of the procedural risks, benefits and alternatives. All questions were addressed. Maximal Sterile Barrier Technique was utilized including caps, mask, sterile gowns, sterile gloves, sterile drape, hand hygiene and skin antiseptic. A timeout was performed prior to the initiation of the procedure. CT PELVIC ABSCESS DRAIN: Previous imaging reviewed. Patient position prone. Noncontrast localization CT performed. The pelvic fluid collection surrounding the uterus was localized. Overlying skin marked for a left trans gluteal approach. Under sterile conditions and local anesthesia, an 18 gauge access needle  was advanced from a left trans gluteal approach into the fluid collection. Needle position confirmed with CT. Syringe aspiration yielded purulent fluid. Guidewire advanced followed by tract dilatation to insert a 10 [place] drain. Drain catheter position confirmed with CT. Syringe aspiration yielded 30 cc exudative fluid. Sample sent for culture. Catheter secured with Prolene suture and connected to external  suction bulb. Sterile dressing applied. No immediate complication. Patient tolerated the procedure well. CT LEFT LOWER QUADRANT ABSCESS DRAIN: Patient was repositioned supine. Noncontrast localization CT performed. The left lower quadrant abscess adjacent to the sigmoid colon was localized and marked for a left anterior oblique approach. Under sterile conditions and local anesthesia, an 18 gauge 15 cm access needle was advanced from a left anterior oblique approach into the left lower quadrant abscess. Syringe aspiration yielded fecal contaminated fluid. Sample sent for culture. Guidewire inserted followed by tract dilatation to insert a 10 [place] drain. Drain catheter position confirmed with CT. Catheter secured with a Prolene suture and connected to external suction bulb. Sterile dressing applied. No immediate complication. Patient tolerated the procedure well. IMPRESSION: Successful CT abscess drains within the pelvic abscess and within the left lower quadrant abscess as above. Electronically Signed   By: Jerilynn Mages.  Shick M.D.   On: 02/03/2019 10:58   Ct Image Guided Fluid Drain By Catheter  Result Date: 02/03/2019 INDICATION: Diverticular left lower quadrant pelvic abscesses EXAM: CT DRAIN PELVIC ABSCESS (TRANS GLUTEAL APPROACH) CT DRAIN LEFT LOWER QUADRANT ABSCESS MEDICATIONS: The patient is currently admitted to the hospital and receiving intravenous antibiotics. The antibiotics were administered within an appropriate time frame prior to the initiation of the procedure. ANESTHESIA/SEDATION: Fentanyl 4 mcg IV; Versed 200 mg IV Moderate Sedation Time:  33 MINUTES The patient was continuously monitored during the procedure by the interventional radiology nurse under my direct supervision. COMPLICATIONS: None immediate. PROCEDURE: Informed written consent was obtained from the patient after a thorough discussion of the procedural risks, benefits and alternatives. All questions were addressed. Maximal Sterile Barrier  Technique was utilized including caps, mask, sterile gowns, sterile gloves, sterile drape, hand hygiene and skin antiseptic. A timeout was performed prior to the initiation of the procedure. CT PELVIC ABSCESS DRAIN: Previous imaging reviewed. Patient position prone. Noncontrast localization CT performed. The pelvic fluid collection surrounding the uterus was localized. Overlying skin marked for a left trans gluteal approach. Under sterile conditions and local anesthesia, an 18 gauge access needle was advanced from a left trans gluteal approach into the fluid collection. Needle position confirmed with CT. Syringe aspiration yielded purulent fluid. Guidewire advanced followed by tract dilatation to insert a 10 [place] drain. Drain catheter position confirmed with CT. Syringe aspiration yielded 30 cc exudative fluid. Sample sent for culture. Catheter secured with Prolene suture and connected to external suction bulb. Sterile dressing applied. No immediate complication. Patient tolerated the procedure well. CT LEFT LOWER QUADRANT ABSCESS DRAIN: Patient was repositioned supine. Noncontrast localization CT performed. The left lower quadrant abscess adjacent to the sigmoid colon was localized and marked for a left anterior oblique approach. Under sterile conditions and local anesthesia, an 18 gauge 15 cm access needle was advanced from a left anterior oblique approach into the left lower quadrant abscess. Syringe aspiration yielded fecal contaminated fluid. Sample sent for culture. Guidewire inserted followed by tract dilatation to insert a 10 [place] drain. Drain catheter position confirmed with CT. Catheter secured with a Prolene suture and connected to external suction bulb. Sterile dressing applied. No immediate complication. Patient tolerated the procedure well.  IMPRESSION: Successful CT abscess drains within the pelvic abscess and within the left lower quadrant abscess as above. Electronically Signed   By: Jerilynn Mages.  Shick  M.D.   On: 02/03/2019 10:58    Labs:  CBC: Recent Labs    02/03/19 0117 02/04/19 0834 02/05/19 0335 02/06/19 0220  WBC 18.0* 14.5* 12.4* 13.1*  HGB 11.0* 10.7* 11.3* 11.5*  HCT 33.1* 32.4* 34.4* 35.1*  PLT 290 295 295 317    COAGS: Recent Labs    01/26/19 2032 01/28/19 0542 02/03/19 0117  INR 1.0 1.3* 1.2    BMP: Recent Labs    02/03/19 0117 02/04/19 0834 02/05/19 0335 02/06/19 0220  NA 131* 135 135 136  K 3.1* 3.4* 3.5 4.4  CL 94* 97* 96* 97*  CO2 27 26 29 30   GLUCOSE 83 78 83 82  BUN <5* <5* <5* <5*  CALCIUM 7.9* 8.0* 8.3* 8.8*  CREATININE 0.50 0.53 0.58 0.59  GFRNONAA >60 >60 >60 >60  GFRAA >60 >60 >60 >60    LIVER FUNCTION TESTS: Recent Labs    01/27/19 0718 01/28/19 0542 01/29/19 0557 01/30/19 0224  BILITOT 0.6 0.4 0.2* 0.6  AST 10* 12* 10* 9*  ALT 12 11 9 8   ALKPHOS 53 63 69 66  PROT 5.9* 6.3* 5.5* 5.2*  ALBUMIN 2.8* 3.0* 2.2* 2.1*    Assessment and Plan:  Sigmoid diverticulitis Abscess Drains x 2: LLQ and Left TG Will follow For surgery per notes  Electronically Signed: Lavonia Drafts, PA-C 02/06/2019, 9:06 AM   I spent a total of 25 Minutes at the the patient's bedside AND on the patient's hospital floor or unit, greater than 50% of which was counseling/coordinating care for diverticular abscess drains

## 2019-02-06 NOTE — Progress Notes (Signed)
[facility]/Trauma Progress Note      Assessment/Plan Sigmoid diverticulitis with3.8 cmabscess, admission 06/27 - 3-4 prior episodes of diverticulitis, none that required hospitalization  - Last colonoscopy 2013 by Dr. Hilarie Fredrickson showed mild diverticulosis in the left colon -S/Pdrain placement06/5for the abscess, now s/p redrainage due to drain migration -Pt is not progressing as well as we would like and will likely require surgery this admission. -Continue drain, IV antibiotics. Will continue to follow. - am labs  ID -cipro/flagyl 6/26-06/30; Zosyn 06/30>>   WBC up to 13.1 VTE -SCDs, sq heparin FEN -FLD, oxy PO for pain Foley -none Follow up -Dr. Leighton Ruff   LOS: 10 days    Subjective: CC: abdominal pain  Pt states pain is a little better today. No nausea, vomiting, fever or chills overnight. She is still requiring pain medicine although less then before. I talked at length with her and her daughter about likely surgery.   Objective: Vital signs in last 24 hours: Temp:  [98 F (36.7 C)-99 F (37.2 C)] 98 F (36.7 C) (07/07 0438) Pulse Rate:  [54-67] 61 (07/07 0438) Resp:  [16-18] 16 (07/07 0438) BP: (156-175)/(68-83) 172/83 (07/07 0438) SpO2:  [95 %-98 %] 98 % (07/07 0438) Weight:  [93.3 kg] 93.3 kg (07/07 0438) Last BM Date: 02/05/19  Intake/Output from previous day: 07/06 0701 - 07/07 0700 In: 2230.7 [P.O.:958; I.V.:1152.7; IV Piggyback:100] Out: 65 [Drains:65] Intake/Output this shift: No intake/output data recorded.  PE: Gen: Alert, NAD, pleasant, cooperative Pulm:Rate andeffort normal Abd: Soft,mild distention, +BS,TTP in LLQ withguarding, no peritonitis, drain in LLQ with cloudy serosanguinous drainage, posterior drain with cloudy serous drainage Skin: no rashes noted, warm and dry Neuro: alert and oriented.   Anti-infectives: Anti-infectives (From admission, onward)   Start     Dose/Rate Route Frequency Ordered Stop    02/02/19 1415  fluconazole (DIFLUCAN) IVPB 400 mg     400 mg 100 mL/hr over 120 Minutes Intravenous Every 24 hours 02/02/19 1408     01/30/19 1200  piperacillin-tazobactam (ZOSYN) IVPB 3.375 g     3.375 g 12.5 mL/hr over 240 Minutes Intravenous Every 8 hours 01/30/19 1045     01/27/19 1015  ciprofloxacin (CIPRO) IVPB 400 mg  Status:  Discontinued     400 mg 200 mL/hr over 60 Minutes Intravenous Every 12 hours 01/27/19 0117 01/30/19 1039   01/27/19 0700  metroNIDAZOLE (FLAGYL) IVPB 500 mg  Status:  Discontinued     500 mg 100 mL/hr over 60 Minutes Intravenous Every 8 hours 01/27/19 0117 01/30/19 1039   01/26/19 2245  ciprofloxacin (CIPRO) IVPB 400 mg     400 mg 200 mL/hr over 60 Minutes Intravenous  Once 01/26/19 2232 01/27/19 0026   01/26/19 2245  metroNIDAZOLE (FLAGYL) IVPB 500 mg     500 mg 100 mL/hr over 60 Minutes Intravenous  Once 01/26/19 2232 01/27/19 0010      Lab Results:  Recent Labs    02/05/19 0335 02/06/19 0220  WBC 12.4* 13.1*  HGB 11.3* 11.5*  HCT 34.4* 35.1*  PLT 295 317   BMET Recent Labs    02/05/19 0335 02/06/19 0220  NA 135 136  K 3.5 4.4  CL 96* 97*  CO2 29 30  GLUCOSE 83 82  BUN <5* <5*  CREATININE 0.58 0.59  CALCIUM 8.3* 8.8*   PT/INR No results for input(s): LABPROT, INR in the last 72 hours. CMP     Component Value Date/Time   NA 136 02/06/2019 0220   NA  140 01/25/2019 0924   K 4.4 02/06/2019 0220   CL 97 (L) 02/06/2019 0220   CO2 30 02/06/2019 0220   GLUCOSE 82 02/06/2019 0220   BUN <5 (L) 02/06/2019 0220   BUN 8 01/25/2019 0924   CREATININE 0.59 02/06/2019 0220   CALCIUM 8.8 (L) 02/06/2019 0220   PROT 5.2 (L) 01/30/2019 0224   PROT 6.8 01/25/2019 0924   ALBUMIN 2.1 (L) 01/30/2019 0224   ALBUMIN 4.0 01/25/2019 0924   AST 9 (L) 01/30/2019 0224   ALT 8 01/30/2019 0224   ALKPHOS 66 01/30/2019 0224   BILITOT 0.6 01/30/2019 0224   BILITOT 0.4 01/25/2019 0924   GFRNONAA >60 02/06/2019 0220   GFRAA >60 02/06/2019 0220    Lipase     Component Value Date/Time   LIPASE 26 01/26/2019 2032    Studies/Results: No results found.    Kalman Drape , [facility] 02/06/2019, 8:48 AM  Pager: [phone] Mon-Wed, Friday 7:00am-4:30pm Thurs 7am-11:30am  Consults: [phone]

## 2019-02-06 NOTE — Progress Notes (Signed)
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 sae pcp 1 day PTA  For ongoing diarrhea and abdominal pain X5 DAYS-and was diagnosed clinically as diverticulitis and started on Augmentin and Flagyl. Patient says that she did not feel better and in fact her bloating got worse with significant worsening of abdominal pain.   In the ER, CT scan of the abdomen pelvis showed acute complicated sigmoid diverticulitis- 3.8 cm abscess adjacent to sigmoid colon. Small to moderate volume of fluid within the pelvis with questionable rim enhancement.  General surgery  CCS was consulted advised IV antibiotics and IR drainage of the abscess and was admitted.  Patient had low-grade fever and nonbloody diarrhea. 7/2: Patient is slowly improving, tolerating diet having bowel movements. 7/3- feels bloated, wbc trended up. Ct planned 7/4: Standing leukocytosis, IR guided CT drain placement again on left gluteal, had LLQ drain too.  Subjective: Seen/examined No nausea/vomitting. Had BM last night, tolerating po No fever Still having left lower quadrant abdominal pain despite being on antibiotics for 10 days Had 65 ml drain output/24 hr Wbc slight up 13.1k from 12.4k  Assessment & Plan:  Acute sigmoid diverticulitis with abscess: With 4-5 prior episodes of diverticulitis that did not require hospitalization.  Colonoscopy abt 5 yrs diverticulosis.S/P dain placement 6/29 for abscess-WBC was trending up, repeat CT 7/3 persistent acute left lower quadrant sigmoid diverticulitis with 2 pelvic abscesses, left lower quadrant abscess drain was retracted into the pericolonic fat.  S/p replacement of  LLQ drain and new pelvic drain 7/4.  Drain having serosanguineous  output.  Patient still having pain, leukocytosis uptrending today.  Slow to improve. Discussed with surgery team, if patient does not improve in the next day or 2, surgery team exploring option for Hartman's procedure Cont Zosyn and fluconazole- first drain culture grew pansensitive E. Coli/ viridan strep and candida.  Drain culture 7/4 reintubated, Gram stain showed  GNR and GPC. Cont diet as per general surgery appreciate input.   Chronic diastolic CHF: Compensated. Pt retaining fluid but no shortness of breath. Continue her HCTZ.   Swelling of the right upper extremity/and right lower leg.  Swelling is improving-cont HCTZ.  negative for DVT in duplex. On gentle ivf at 30 ml/hr.She has had ct w contrast and willed another.    Anxiety/depression on home Xanax, buspirone, Cymbalta and trazodone.  Resumed home meds. Continue PRN Ativan/Xanax.   Essential hypertension : BP fairly controlled on Metoprolol, HCTZ losartan  CAD with prior stent: Cont asa.  Prediabetes: A1c 5.8.  We will advise her to follow-up with PCP, for now continue on diet control.  HLD:  on statins  OSA: On Nocturnal CPAP but patient has been refusing.    Hypokalemia :Cont kcl in ivf.  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  His surgery team. Disposition Plan: Remains inpatient pending clinical improvement.  Possible surgical intervention if does not improve soon   Consultants:  CCS  Procedures:  DRAIN PLACEMENT FOR ABSCESS Drain replacement and new drain ln left gluteal 7/4  Antimicrobials: Anti-infectives (From admission, onward)   Start  Dose/Rate Route Frequency Ordered Stop   02/02/19 1415  fluconazole (DIFLUCAN) IVPB 400 mg     400 mg 100 mL/hr over 120 Minutes Intravenous Every 24 hours 02/02/19 1408     01/30/19 1200  piperacillin-tazobactam (ZOSYN)  IVPB 3.375 g     3.375 g 12.5 mL/hr over 240 Minutes Intravenous Every 8 hours 01/30/19 1045     01/27/19 1015  ciprofloxacin (CIPRO) IVPB 400 mg  Status:  Discontinued     400 mg 200 mL/hr over 60 Minutes Intravenous Every 12 hours 01/27/19 0117 01/30/19 1039   01/27/19 0700  metroNIDAZOLE (FLAGYL) IVPB 500 mg  Status:  Discontinued     500 mg 100 mL/hr over 60 Minutes Intravenous Every 8 hours 01/27/19 0117 01/30/19 1039   01/26/19 2245  ciprofloxacin (CIPRO) IVPB 400 mg     400 mg 200 mL/hr over 60 Minutes Intravenous  Once 01/26/19 2232 01/27/19 0026   01/26/19 2245  metroNIDAZOLE (FLAGYL) IVPB 500 mg     500 mg 100 mL/hr over 60 Minutes Intravenous  Once 01/26/19 2232 01/27/19 0010       Objective: Vitals:   02/05/19 0959 02/05/19 1544 02/05/19 2108 02/06/19 0438  BP: (!) 156/68 (!) 162/77 (!) 175/76 (!) 172/83  Pulse: 61 (!) 54 67 61  Resp:  18 18 16   Temp:  98 F (36.7 C) 99 F (37.2 C) 98 F (36.7 C)  TempSrc:  Oral Oral Oral  SpO2:  97% 95% 98%  Weight:    93.3 kg  Height:        Intake/Output Summary (Last 24 hours) at 02/06/2019 0742 Last data filed at 02/06/2019 0525 Gross per 24 hour  Intake 2230.7 ml  Output 65 ml  Net 2165.7 ml   Filed Weights   02/04/19 0500 02/05/19 0500 02/06/19 0438  Weight: 97.8 kg 97.8 kg 93.3 kg   Weight change: -4.5 kg  Body mass index is 36.44 kg/m.  Intake/Output from previous day: 07/06 0701 - 07/07 0700 In: 2230.7 [P.O.:958; I.V.:1152.7; IV Piggyback:100] Out: 65 [Drains:65] Intake/Output this shift: No intake/output data recorded.  Examination:  General exam: Calm, comfortable, not in acute distress, anxious since hearing abt surgery\ HEENT:Oral mucosa moist, Ear/Nose WNL grossly, dentition normal. Respiratory system: Bilateral equal air entry, no crackles and wheezing, no use of accessory muscle, non tender on palpation. Cardiovascular system: regular rate and rhythm, S1 & S2 heard, No  JVD/murmurs. Gastrointestinal system: Abdomen soft, tender in left lower quadrant, obese and bowel sounds present. Drains present left lower quadrant and left gluteal area  Nervous System:Alert, awake and oriented at baseline. Non focal Extremities: No edema, distal peripheral pulses palpable.  Skin: No rashes,no icterus. MSK: Normal muscle bulk,tone, power  Medications:  Scheduled Meds:  aspirin EC  325 mg Oral Daily   busPIRone  15 mg Oral TID   docusate sodium  100 mg Oral Daily   DULoxetine  60 mg Oral Daily   enoxaparin (LOVENOX) injection  40 mg Subcutaneous Q24H   guaiFENesin  600 mg Oral BID   losartan  100 mg Oral Daily   And   hydrochlorothiazide  25 mg Oral Daily   metoprolol tartrate  25 mg Oral BID   rosuvastatin  10 mg Oral Daily   sodium chloride flush  5 mL Intracatheter Q8H   sodium chloride flush  5 mL Intracatheter Q8H   Continuous Infusions:  0.9 % NaCl with KCl 20 mEq / L 30 mL/hr at 02/05/19 2111   fluconazole (DIFLUCAN)  IV 400 mg (02/05/19 1006)   piperacillin-tazobactam (ZOSYN)  IV 3.375 g (02/06/19 0332)    Data Reviewed: I have personally reviewed following labs and imaging studies  CBC: Recent Labs  Lab 02/02/19 0445 02/03/19 0117 02/04/19 0834 02/05/19 0335 02/06/19 0220  WBC 17.4* 18.0* 14.5* 12.4* 13.1*  HGB 11.5* 11.0* 10.7* 11.3* 11.5*  HCT 34.2* 33.1* 32.4* 34.4* 35.1*  MCV 84.2 84.0 83.7 84.5 85.2  PLT 296 290 295 295 704   Basic Metabolic Panel: Recent Labs  Lab 01/31/19 0630  02/01/19 0527 02/02/19 0445 02/03/19 0117 02/04/19 0834 02/05/19 0335 02/06/19 0220  NA  --    < > 136 135 131* 135 135 136  K  --    < > 3.0* 3.5 3.1* 3.4* 3.5 4.4  CL  --    < > 99 97* 94* 97* 96* 97*  CO2  --    < > 26 28 27 26 29 30   GLUCOSE  --    < > 92 96 83 78 83 82  BUN  --    < > <5* <5* <5* <5* <5* <5*  CREATININE  --    < > 0.42* 0.50 0.50 0.53 0.58 0.59  CALCIUM  --    < > 7.7* 7.9* 7.9* 8.0* 8.3* 8.8*  MG 2.2  --  1.9  1.7 1.7  --   --   --    < > = values in this interval not displayed.   GFR: Estimated Creatinine Clearance: 83.3 mL/min (by C-G formula based on SCr of 0.59 mg/dL). Liver Function Tests: No results for input(s): AST, ALT, ALKPHOS, BILITOT, PROT, ALBUMIN in the last 168 hours. No results for input(s): LIPASE, AMYLASE in the last 168 hours. No results for input(s): AMMONIA in the last 168 hours. Coagulation Profile: Recent Labs  Lab 02/03/19 0117  INR 1.2   Cardiac Enzymes: No results for input(s): CKTOTAL, CKMB, CKMBINDEX, TROPONINI in the last 168 hours. BNP (last 3 results) No results for input(s): PROBNP in the last 8760 hours. HbA1C: No results for input(s): HGBA1C in the last 72 hours. CBG: No results for input(s): GLUCAP in the last 168 hours. Lipid Profile: No results for input(s): CHOL, HDL, LDLCALC, TRIG, CHOLHDL, LDLDIRECT in the last 72 hours. Thyroid Function Tests: No results for input(s): TSH, T4TOTAL, FREET4, T3FREE, THYROIDAB in the last 72 hours. Anemia Panel: No results for input(s): VITAMINB12, FOLATE, FERRITIN, TIBC, IRON, RETICCTPCT in the last 72 hours. Sepsis Labs: No results for input(s): PROCALCITON, LATICACIDVEN in the last 168 hours.  Recent Results (from the past 240 hour(s))  Aerobic/Anaerobic Culture (surgical/deep wound)     Status: None   Collection Time: 01/29/19  2:19 PM   Specimen: Abscess  Result Value Ref Range Status   Specimen Description ABSCESS JP DRAINAGE  Final   Special Requests NONE  Final   Gram Stain   Final    ABUNDANT WBC PRESENT, PREDOMINANTLY PMN ABUNDANT GRAM POSITIVE COCCI    Culture   Final    FEW ESCHERICHIA COLI MODERATE VIRIDANS STREPTOCOCCUS FEW CANDIDA ALBICANS NO ANAEROBES ISOLATED Performed at [facility], [address], [place], [place] [address]    Report Status 02/03/2019 FINAL  Final   Organism ID, Bacteria ESCHERICHIA COLI  Final      Susceptibility   Escherichia coli - MIC*    AMPICILLIN  <=2 SENSITIVE Sensitive     CEFAZOLIN <=4 SENSITIVE Sensitive     CEFEPIME <=1 SENSITIVE Sensitive  CEFTAZIDIME <=1 SENSITIVE Sensitive     CEFTRIAXONE <=1 SENSITIVE Sensitive     CIPROFLOXACIN <=0.25 SENSITIVE Sensitive     GENTAMICIN <=1 SENSITIVE Sensitive     IMIPENEM <=0.25 SENSITIVE Sensitive     TRIMETH/SULFA <=20 SENSITIVE Sensitive     AMPICILLIN/SULBACTAM <=2 SENSITIVE Sensitive     PIP/TAZO <=4 SENSITIVE Sensitive     Extended ESBL NEGATIVE Sensitive     * FEW ESCHERICHIA COLI  Aerobic/Anaerobic Culture (surgical/deep wound)     Status: None (Preliminary result)   Collection Time: 02/03/19 10:21 AM   Specimen: Pelvis; Abscess  Result Value Ref Range Status   Specimen Description PELVIS  Final   Special Requests Immunocompromised  Final   Gram Stain   Final    ABUNDANT WBC PRESENT, PREDOMINANTLY PMN RARE GRAM POSITIVE COCCI IN PAIRS IN CHAINS    Culture   Final    NO GROWTH 2 DAYS NO ANAEROBES ISOLATED; CULTURE IN PROGRESS FOR 5 DAYS Performed at [facility], [place] [address], [place], [place] [address]    Report Status PENDING  Incomplete  Aerobic/Anaerobic Culture (surgical/deep wound)     Status: None (Preliminary result)   Collection Time: 02/03/19 10:22 AM   Specimen: Abscess  Result Value Ref Range Status   Specimen Description ABSCESS  Final   Special Requests Immunocompromised  Final   Gram Stain   Final    ABUNDANT WBC PRESENT, PREDOMINANTLY PMN ABUNDANT GRAM NEGATIVE RODS FEW GRAM POSITIVE COCCI IN CLUSTERS    Culture   Final    CULTURE REINCUBATED FOR BETTER GROWTH Performed at [facility], [place] [address] [place], [place] [address]    Report Status PENDING  Incomplete      Radiology Studies: No results found.    LOS: 10 days   Time spent: More than 50% of that time was spent in counseling and/or coordination of care.  Antonieta Pert, MD [facility]  02/06/2019, 7:42 AM

## 2019-02-06 NOTE — Progress Notes (Signed)
   02/06/19 1300  Clinical Encounter Type  Visited With Patient;Health care provider  Visit Type Initial;Spiritual support;Psychological support  Referral From Nurse  Spiritual Encounters  Spiritual Needs Emotional  Stress Factors  Patient Stress Factors Loss of control;Major life changes;Family relationships;Health changes   Referral from RN.  Pt had received unexpected news about possible or likely surgery.  Empathetic listening.  Pt has significant other life change:  Husband separated about a year ago after over 74 yrs of marriage.  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  Has support of mom, adult kids, friends, and her church.  She spoke w/ her pastor today via telephone.  Myra Gianotti resident, [phone]

## 2019-02-06 NOTE — Progress Notes (Signed)
Pt noted BP 172/83 K. Kirby,NP notfied via text page with no new order.

## 2019-02-07 LAB — BASIC METABOLIC PANEL
Anion gap: 12 (ref 5–15)
BUN: <5 mg/dL — ABNORMAL LOW (ref 6–20)
CO2: 26 mmol/L (ref 22–32)
Calcium: 8.9 mg/dL (ref 8.9–10.3)
Chloride: 98 mmol/L (ref 98–111)
Creatinine, Ser: 0.62 mg/dL (ref 0.44–1.00)
GFR calc Af Amer: >60 mL/min (ref >60)
GFR calc non Af Amer: >60 mL/min (ref >60)
Glucose, Bld: 92 mg/dL (ref 70–99)
Potassium: 4.6 mmol/L (ref 3.5–5.1)
Sodium: 136 mmol/L (ref 135–145)

## 2019-02-07 LAB — CBC
HCT: 38.1 % (ref 36.0–46.0)
Hemoglobin: 12.6 g/dL (ref 12.0–15.0)
MCH: 27.8 pg (ref 26.0–34.0)
MCHC: 33.1 g/dL (ref 30.0–36.0)
MCV: 83.9 fL (ref 80.0–100.0)
Platelets: 366 10*3/uL (ref 150–400)
RBC: 4.54 MIL/uL (ref 3.87–5.11)
RDW: 13.2 % (ref 11.5–15.5)
WBC: 14.1 10*3/uL — ABNORMAL HIGH (ref 4.0–10.5)
nRBC: 0 % (ref 0.0–0.2)

## 2019-02-07 LAB — SURGICAL PCR SCREEN
MRSA, PCR: NEGATIVE
Staphylococcus aureus: NEGATIVE

## 2019-02-07 MED ORDER — ASPIRIN EC 81 MG PO TBEC
81.0000 mg | DELAYED_RELEASE_TABLET | Freq: Every day | ORAL | Status: DC
  Administered 2019-02-07 – 2019-02-14 (×7): 81 mg via ORAL
  Filled 2019-02-07 (×7): qty 1

## 2019-02-07 MED ORDER — SODIUM CHLORIDE 0.9% FLUSH: 10.0000 mL | Freq: Two times a day (BID) | INTRAVENOUS | Status: DC

## 2019-02-07 MED ORDER — SODIUM CHLORIDE 0.9% FLUSH: 10.0000 mL | INTRAVENOUS | Status: DC | PRN

## 2019-02-07 MED ORDER — BOOST / RESOURCE BREEZE PO LIQD CUSTOM
1.0000 | Freq: Three times a day (TID) | ORAL | Status: DC
  Administered 2019-02-07 – 2019-02-12 (×10): 1 via ORAL

## 2019-02-07 NOTE — Progress Notes (Signed)
[facility]/Trauma Progress Note      Assessment/Plan Sigmoid diverticulitis with3.8 cmabscess, admission 06/27 - 3-4 prior episodes of diverticulitis, none that required hospitalization  - Last colonoscopy 2013 by Dr. Hilarie Fredrickson showed mild diverticulosis in the left colon -S/Pdrain placement06/41for the abscess, 07/04 s/p redrainage due to drain migration -Pain improved this am and very minimally tender, will try to avoid surgery and if WBC increases tomorrow will get CT scan.  -Continue drain, IV antibiotics. Will continue to follow. - am labs  ID -cipro/flagyl 6/26-06/30; Zosyn 06/30>>WBC up to 14.1 from 13.1 yesterday VTE -SCDs, sq heparin FEN -soft diet, NPO at midnight Foley -none Follow up -Dr. Leighton Ruff   LOS: 11 days    Subjective: CC: multiple bowel movements overnight  No real abdominal pain. Pt states no fever or chills overnight. No nausea or vomiting. Spoke with daughter on the phone during visit. She had multiple bowel movements overnight without blood. No increase pain with BM's. She has not needed oxy for about a day and a half.   Objective: Vital signs in last 24 hours: Temp:  [98 F (36.7 C)-98.4 F (36.9 C)] 98.4 F (36.9 C) (07/08 0418) Pulse Rate:  [54-64] 63 (07/08 0418) Resp:  [18] 18 (07/08 0418) BP: (160-173)/(74-77) 166/76 (07/08 0418) SpO2:  [96 %-97 %] 96 % (07/08 0418) Weight:  [94.2 kg] 94.2 kg (07/08 0500) Last BM Date: 02/06/19  Intake/Output from previous day: 07/07 0701 - 07/08 0700 In: 1716.6 [P.O.:480; I.V.:734.5; IV Piggyback:482.1] Out: 60 [Drains:60] Intake/Output this shift: No intake/output data recorded.  PE: Gen: Alert, NAD, pleasant, cooperative Pulm:Rate andeffort normal Abd: Soft,mild distention, +BS,very mild TTP in LLQ withoutguarding, no peritonitis, drain in LLQ with cloudy serosanguinous drainage, posterior drain with cloudy serous drainage Skin: no rashes noted, warm and  dry Neuro: alert and oriented.  Anti-infectives: Anti-infectives (From admission, onward)   Start     Dose/Rate Route Frequency Ordered Stop   02/02/19 1415  fluconazole (DIFLUCAN) IVPB 400 mg     400 mg 100 mL/hr over 120 Minutes Intravenous Every 24 hours 02/02/19 1408     01/30/19 1200  piperacillin-tazobactam (ZOSYN) IVPB 3.375 g     3.375 g 12.5 mL/hr over 240 Minutes Intravenous Every 8 hours 01/30/19 1045     01/27/19 1015  ciprofloxacin (CIPRO) IVPB 400 mg  Status:  Discontinued     400 mg 200 mL/hr over 60 Minutes Intravenous Every 12 hours 01/27/19 0117 01/30/19 1039   01/27/19 0700  metroNIDAZOLE (FLAGYL) IVPB 500 mg  Status:  Discontinued     500 mg 100 mL/hr over 60 Minutes Intravenous Every 8 hours 01/27/19 0117 01/30/19 1039   01/26/19 2245  ciprofloxacin (CIPRO) IVPB 400 mg     400 mg 200 mL/hr over 60 Minutes Intravenous  Once 01/26/19 2232 01/27/19 0026   01/26/19 2245  metroNIDAZOLE (FLAGYL) IVPB 500 mg     500 mg 100 mL/hr over 60 Minutes Intravenous  Once 01/26/19 2232 01/27/19 0010      Lab Results:  Recent Labs    02/06/19 0220 02/07/19 0346  WBC 13.1* 14.1*  HGB 11.5* 12.6  HCT 35.1* 38.1  PLT 317 366   BMET Recent Labs    02/06/19 0220 02/07/19 0346  NA 136 136  K 4.4 4.6  CL 97* 98  CO2 30 26  GLUCOSE 82 92  BUN <5* <5*  CREATININE 0.59 0.62  CALCIUM 8.8* 8.9   PT/INR No results for input(s): LABPROT, INR in the  last 72 hours. CMP     Component Value Date/Time   NA 136 02/07/2019 0346   NA 140 01/25/2019 0924   K 4.6 02/07/2019 0346   CL 98 02/07/2019 0346   CO2 26 02/07/2019 0346   GLUCOSE 92 02/07/2019 0346   BUN <5 (L) 02/07/2019 0346   BUN 8 01/25/2019 0924   CREATININE 0.62 02/07/2019 0346   CALCIUM 8.9 02/07/2019 0346   PROT 5.2 (L) 01/30/2019 0224   PROT 6.8 01/25/2019 0924   ALBUMIN 2.1 (L) 01/30/2019 0224   ALBUMIN 4.0 01/25/2019 0924   AST 9 (L) 01/30/2019 0224   ALT 8 01/30/2019 0224   ALKPHOS 66 01/30/2019  0224   BILITOT 0.6 01/30/2019 0224   BILITOT 0.4 01/25/2019 0924   GFRNONAA >60 02/07/2019 0346   GFRAA >60 02/07/2019 0346   Lipase     Component Value Date/Time   LIPASE 26 01/26/2019 2032    Studies/Results: No results found.    Kalman Drape , [facility] 02/07/2019, 8:05 AM  Pager: [phone] Mon-Wed, Friday 7:00am-4:30pm Thurs 7am-11:30am  Consults: [phone]

## 2019-02-07 NOTE — Progress Notes (Signed)
Referring Physician(s): Rolm Bookbinder  Supervising Physician: Corrie Mckusick  Patient Status:  [facility] - In-pt  Chief Complaint: None  Subjective:  Diverticular abscesses x2 s/p LLQ drain placement 01/29/2019 by Dr. Laurence Ferrari (exchanged 02/03/2019 by Dr. Annamaria Boots) and left TG drain placement 02/03/2019 by Dr. Annamaria Boots. Patient awake and alert laying in bed with no complaints at this time. LLQ and left TG drain sites c/d/i.   Allergies: Patient has no known allergies.  Medications: Prior to Admission medications   Medication Sig Start Date End Date Taking? Authorizing Provider  ALPRAZolam (XANAX) 0.25 MG tablet Take 1 tablet (0.25 mg total) by mouth daily as needed for anxiety. 08/07/18  Yes Terald Sleeper, PA-C  aspirin EC 325 MG tablet Take 325 mg by mouth daily.   Yes [provider]  busPIRone (BUSPAR) 15 MG tablet Take 1 tablet (15 mg total) by mouth 3 (three) times daily. 08/07/18  Yes Terald Sleeper, PA-C  Cholecalciferol 2000 units CAPS Take 1 capsule (2,000 Units total) by mouth daily. 08/17/16  Yes Beasley, Caren D, MD  Coenzyme Q10 (CO Q10) 100 MG CAPS Take 1 capsule by mouth daily.   Yes [provider]  DULoxetine (CYMBALTA) 30 MG capsule Take 2 capsules (60 mg total) by mouth daily. 01/01/19  Yes Terald Sleeper, PA-C  losartan-hydrochlorothiazide (HYZAAR) 100-25 MG tablet Take 1 tablet by mouth daily. 07/24/18  Yes Terald Sleeper, PA-C  metoprolol tartrate (LOPRESSOR) 25 MG tablet TAKE 1 TABLET BY MOUTH 2 TIMES DAILY. Patient taking differently: Take 25 mg by mouth 2 (two) times daily.  10/24/18  Yes Terald Sleeper, PA-C  metroNIDAZOLE (FLAGYL) 500 MG tablet Take 1 tablet (500 mg total) by mouth 3 (three) times daily. 01/25/19  Yes Gottschalk, Ashly M, DO  nitroGLYCERIN (NITROSTAT) 0.4 MG SL tablet 1 TABLET UNDER TONGUE EVERY 5 MINUTES UP TO 3 TIMES FOR CHEST PAIN THEN CALL DR IF NO RELIEF Patient taking differently: Place 0.4 mg under the tongue every 5 (five)  minutes as needed for chest pain. 1 TABLET UNDER TONGUE EVERY 5 MINUTES UP TO 3 TIMES FOR CHEST PAIN THEN CALL DR IF NO RELIEF 02/23/18  Yes Terald Sleeper, PA-C  rosuvastatin (CRESTOR) 10 MG tablet Take 1 tablet (10 mg total) by mouth daily. 07/03/18  Yes Terald Sleeper, PA-C  terbinafine (LAMISIL) 250 MG tablet Take 1 tablet (250 mg total) by mouth daily. 12/19/18  Yes Terald Sleeper, PA-C  traZODone (DESYREL) 50 MG tablet Take 1-2 tablets (50-100 mg total) by mouth at bedtime as needed for sleep. 05/03/18  Yes Jones, Angel S, PA-C  VASCEPA 1 g CAPS TAKE 2 CAPULES BY MOUTH TWICE DAILY Patient taking differently: Take 2 capsules by mouth 2 (two) times a day.  07/11/18  Yes Terald Sleeper, PA-C  Vitamin D, Ergocalciferol, (DRISDOL) 1.25 MG (50000 UT) CAPS capsule Take 1 capsule (50,000 Units total) by mouth every 7 (seven) days. Patient taking differently: Take 50,000 Units by mouth every Sunday.  08/30/18  Yes Terald Sleeper, PA-C  econazole nitrate 1 % cream Apply topically 2 (two) times daily. 12/19/18   Terald Sleeper, PA-C     Vital Signs: BP (!) 166/76 (BP Location: Right Arm)    Pulse 63    Temp 98.4 F (36.9 C) (Oral)    Resp 18    Ht 5\' 3"  (1.6 m)    Wt 207 lb 10.8 oz (94.2 kg)    LMP 12/31/2013  SpO2 96%    BMI 36.79 kg/m   Physical Exam Vitals signs and nursing note reviewed.  Constitutional:      General: She is not in acute distress.    Appearance: Normal appearance.  Pulmonary:     Effort: Pulmonary effort is normal. No respiratory distress.  Abdominal:     Palpations: Abdomen is soft.     Tenderness: There is no abdominal tenderness.     Comments: LLQ and left TG drain sites without tenderness, erythema, drainage, or active bleeding; LLQ drain with minimal output of blood-tinged fluid while left TG drain with minimal output of clear yellow fluid (of note, patient states that the drains were emptied this AM prior to my arrival, chart reveals 60 cc output in last 24 hours).    Skin:    General: Skin is warm and dry.  Neurological:     Mental Status: She is alert and oriented to person, place, and time.  Psychiatric:        Mood and Affect: Mood normal.        Behavior: Behavior normal.        Thought Content: Thought content normal.        Judgment: Judgment normal.     Imaging: Ct Image Guided Fluid Drain By Catheter  Result Date: 02/03/2019 INDICATION: Diverticular left lower quadrant pelvic abscesses EXAM: CT DRAIN PELVIC ABSCESS (TRANS GLUTEAL APPROACH) CT DRAIN LEFT LOWER QUADRANT ABSCESS MEDICATIONS: The patient is currently admitted to the hospital and receiving intravenous antibiotics. The antibiotics were administered within an appropriate time frame prior to the initiation of the procedure. ANESTHESIA/SEDATION: Fentanyl 4 mcg IV; Versed 200 mg IV Moderate Sedation Time:  66 MINUTES The patient was continuously monitored during the procedure by the interventional radiology nurse under my direct supervision. COMPLICATIONS: None immediate. PROCEDURE: Informed written consent was obtained from the patient after a thorough discussion of the procedural risks, benefits and alternatives. All questions were addressed. Maximal Sterile Barrier Technique was utilized including caps, mask, sterile gowns, sterile gloves, sterile drape, hand hygiene and skin antiseptic. A timeout was performed prior to the initiation of the procedure. CT PELVIC ABSCESS DRAIN: Previous imaging reviewed. Patient position prone. Noncontrast localization CT performed. The pelvic fluid collection surrounding the uterus was localized. Overlying skin marked for a left trans gluteal approach. Under sterile conditions and local anesthesia, an 18 gauge access needle was advanced from a left trans gluteal approach into the fluid collection. Needle position confirmed with CT. Syringe aspiration yielded purulent fluid. Guidewire advanced followed by tract dilatation to insert a 10 [place] drain. Drain  catheter position confirmed with CT. Syringe aspiration yielded 30 cc exudative fluid. Sample sent for culture. Catheter secured with Prolene suture and connected to external suction bulb. Sterile dressing applied. No immediate complication. Patient tolerated the procedure well. CT LEFT LOWER QUADRANT ABSCESS DRAIN: Patient was repositioned supine. Noncontrast localization CT performed. The left lower quadrant abscess adjacent to the sigmoid colon was localized and marked for a left anterior oblique approach. Under sterile conditions and local anesthesia, an 18 gauge 15 cm access needle was advanced from a left anterior oblique approach into the left lower quadrant abscess. Syringe aspiration yielded fecal contaminated fluid. Sample sent for culture. Guidewire inserted followed by tract dilatation to insert a 10 [place] drain. Drain catheter position confirmed with CT. Catheter secured with a Prolene suture and connected to external suction bulb. Sterile dressing applied. No immediate complication. Patient tolerated the procedure well. IMPRESSION: Successful CT abscess drains  within the pelvic abscess and within the left lower quadrant abscess as above. Electronically Signed   By: Jerilynn Mages.  Shick M.D.   On: 02/03/2019 10:58   Ct Image Guided Fluid Drain By Catheter  Result Date: 02/03/2019 INDICATION: Diverticular left lower quadrant pelvic abscesses EXAM: CT DRAIN PELVIC ABSCESS (TRANS GLUTEAL APPROACH) CT DRAIN LEFT LOWER QUADRANT ABSCESS MEDICATIONS: The patient is currently admitted to the hospital and receiving intravenous antibiotics. The antibiotics were administered within an appropriate time frame prior to the initiation of the procedure. ANESTHESIA/SEDATION: Fentanyl 4 mcg IV; Versed 200 mg IV Moderate Sedation Time:  42 MINUTES The patient was continuously monitored during the procedure by the interventional radiology nurse under my direct supervision. COMPLICATIONS: None immediate. PROCEDURE: Informed  written consent was obtained from the patient after a thorough discussion of the procedural risks, benefits and alternatives. All questions were addressed. Maximal Sterile Barrier Technique was utilized including caps, mask, sterile gowns, sterile gloves, sterile drape, hand hygiene and skin antiseptic. A timeout was performed prior to the initiation of the procedure. CT PELVIC ABSCESS DRAIN: Previous imaging reviewed. Patient position prone. Noncontrast localization CT performed. The pelvic fluid collection surrounding the uterus was localized. Overlying skin marked for a left trans gluteal approach. Under sterile conditions and local anesthesia, an 18 gauge access needle was advanced from a left trans gluteal approach into the fluid collection. Needle position confirmed with CT. Syringe aspiration yielded purulent fluid. Guidewire advanced followed by tract dilatation to insert a 10 [place] drain. Drain catheter position confirmed with CT. Syringe aspiration yielded 30 cc exudative fluid. Sample sent for culture. Catheter secured with Prolene suture and connected to external suction bulb. Sterile dressing applied. No immediate complication. Patient tolerated the procedure well. CT LEFT LOWER QUADRANT ABSCESS DRAIN: Patient was repositioned supine. Noncontrast localization CT performed. The left lower quadrant abscess adjacent to the sigmoid colon was localized and marked for a left anterior oblique approach. Under sterile conditions and local anesthesia, an 18 gauge 15 cm access needle was advanced from a left anterior oblique approach into the left lower quadrant abscess. Syringe aspiration yielded fecal contaminated fluid. Sample sent for culture. Guidewire inserted followed by tract dilatation to insert a 10 [place] drain. Drain catheter position confirmed with CT. Catheter secured with a Prolene suture and connected to external suction bulb. Sterile dressing applied. No immediate complication. Patient tolerated  the procedure well. IMPRESSION: Successful CT abscess drains within the pelvic abscess and within the left lower quadrant abscess as above. Electronically Signed   By: Jerilynn Mages.  Shick M.D.   On: 02/03/2019 10:58    Labs:  CBC: Recent Labs    02/04/19 0834 02/05/19 0335 02/06/19 0220 02/07/19 0346  WBC 14.5* 12.4* 13.1* 14.1*  HGB 10.7* 11.3* 11.5* 12.6  HCT 32.4* 34.4* 35.1* 38.1  PLT 295 295 317 366    COAGS: Recent Labs    01/26/19 2032 01/28/19 0542 02/03/19 0117  INR 1.0 1.3* 1.2    BMP: Recent Labs    02/04/19 0834 02/05/19 0335 02/06/19 0220 02/07/19 0346  NA 135 135 136 136  K 3.4* 3.5 4.4 4.6  CL 97* 96* 97* 98  CO2 26 29 30 26   GLUCOSE 78 83 82 92  BUN <5* <5* <5* <5*  CALCIUM 8.0* 8.3* 8.8* 8.9  CREATININE 0.53 0.58 0.59 0.62  GFRNONAA >60 >60 >60 >60  GFRAA >60 >60 >60 >60    LIVER FUNCTION TESTS: Recent Labs    01/27/19 0718 01/28/19 0542 01/29/19 0557 01/30/19  0224  BILITOT 0.6 0.4 0.2* 0.6  AST 10* 12* 10* 9*  ALT 12 11 9 8   ALKPHOS 53 63 69 66  PROT 5.9* 6.3* 5.5* 5.2*  ALBUMIN 2.8* 3.0* 2.2* 2.1*    Assessment and Plan:  Diverticular abscesses x2 s/p LLQ drain placement 01/29/2019 by Dr. Laurence Ferrari (exchanged 02/03/2019 by Dr. Annamaria Boots) and left TG drain placement 02/03/2019 by Dr. Annamaria Boots. LLQ and left TG drains stable, LLQ drain with minimal output of blood-tinged fluid while left TG drain with minimal output of clear yellow fluid (of note, patient states that the drains were emptied this AM prior to my arrival, chart reveals 60 cc output in last 24 hours). Continue with Qshift flushes/monitor of output. Plan for repeat CT/possible drain injection when output <10 cc/24 hours (per drain) to assess for possible removal- if patient up for discharge, will see in drain clinic for CT/possible drain injection in 10-14 days after discharge. Further plans per [facility]/CCS- appreciate and agree with management. IR to follow.   Electronically  Signed: Earley Abide, PA-C 02/07/2019, 10:35 AM   I spent a total of 25 Minutes at the the patient's bedside AND on the patient's hospital floor or unit, greater than 50% of which was counseling/coordinating care for diverticular abscesses x2 s/p drain placement x2.

## 2019-02-07 NOTE — Progress Notes (Signed)
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 sae pcp 1 day PTA  For ongoing diarrhea and abdominal pain X5 DAYS-and was diagnosed clinically as diverticulitis and started on Augmentin and Flagyl. Patient says that she did not feel better and in fact her bloating got worse with significant worsening of abdominal pain.   In the ER, CT scan of the abdomen pelvis showed acute complicated sigmoid diverticulitis- 3.8 cm abscess adjacent to sigmoid colon. Small to moderate volume of fluid within the pelvis with questionable rim enhancement.  General surgery  CCS was consulted advised IV antibiotics and IR drainage of the abscess and was admitted.  Patient had low-grade fever and nonbloody diarrhea. 7/2: Patient is slowly improving, tolerating diet having bowel movements. 7/3- feels bloated, wbc trended up. Ct planned 7/4: Standing leukocytosis, IR guided CT drain placement again on left gluteal, had LLQ drain too.  Subjective: Seen/examined No nausea/vomitting. Had BM last night, tolerating po No fever Still having left lower quadrant abdominal pain despite being on antibiotics for 10 days Had 65 ml drain output/24 hr Wbc slight up 13.1k from 12.4k  Assessment & Plan:  Acute sigmoid diverticulitis with abscess:  Pt with 4-5 prior episodes of diverticulitis that did not require hospitalization.  Colonoscopy abt 5 yrs diverticulosis. S/P dain placement 6/29 for abscess. WBC was trending up, repeat CT 7/3 persistent acute left lower quadrant sigmoid diverticulitis with 2 pelvic abscesses, left lower quadrant abscess drain was retracted into the pericolonic fat.  S/p replacement of  LLQ drain and new pelvic drain 7/4.  Drain having  serosanguineous output.   --General surgery following, appreciate assistance --Patient's abdominal pain improved, WBC count slightly increased from 13.1-14.1 today --Left abdominal drain with 33 mL's past 24 hours, left buttock drain 27 mL's past 24 hours --Surgery advance diet from clear liquid to soft today --Continues on Zosyn for empiric antimicrobial coverage --If WBC count continues to increase and abdominal pain returns, general surgery planning repeat CT abdomen/pelvis versus Hartman's procedure  Chronic diastolic CHF:  Compensated. Pt retaining fluid but no shortness of breath. Continue   HCTZ.    Swelling of the right upper extremity/and right lower leg.   Swelling is improving-cont HCTZ.  U/S duplex negative for DVT.     Anxiety/depression  on home Xanax, buspirone, Cymbalta and trazodone.  Resumed home meds.   Essential hypertension: BP fairly controlled on Metoprolol, HCTZ losartan  CAD with prior stent: Cont asa.  Prediabetes: A1c 5.8.  We will advise her to follow-up with PCP, for now continue on diet control.  HLD:  on statins  OSA: On Nocturnal CPAP but patient has been refusing.    Hypokalemia: Cont kcl in ivf.  DVT prophylaxis:  High risk for DVT on Lovenox  Code Status: Full Code Family Communication:  none Disposition Plan: Remains inpatient pending clinical improvement.  Possible surgical intervention if does not improve soon   Consultants:  CCS  Procedures:  DRAIN PLACEMENT FOR ABSCESS Left Abd Drain replacement and new drain ln left gluteal 7/4  Antimicrobials: Anti-infectives (From admission, onward)   Start     Dose/Rate Route Frequency Ordered Stop   02/02/19 1415  fluconazole (DIFLUCAN) IVPB 400 mg     400 mg 100  mL/hr over 120 Minutes Intravenous Every 24 hours 02/02/19 1408     01/30/19 1200  piperacillin-tazobactam (ZOSYN) IVPB 3.375 g     3.375 g 12.5 mL/hr over 240 Minutes Intravenous Every 8 hours 01/30/19 1045     01/27/19 1015   ciprofloxacin (CIPRO) IVPB 400 mg  Status:  Discontinued     400 mg 200 mL/hr over 60 Minutes Intravenous Every 12 hours 01/27/19 0117 01/30/19 1039   01/27/19 0700  metroNIDAZOLE (FLAGYL) IVPB 500 mg  Status:  Discontinued     500 mg 100 mL/hr over 60 Minutes Intravenous Every 8 hours 01/27/19 0117 01/30/19 1039   01/26/19 2245  ciprofloxacin (CIPRO) IVPB 400 mg     400 mg 200 mL/hr over 60 Minutes Intravenous  Once 01/26/19 2232 01/27/19 0026   01/26/19 2245  metroNIDAZOLE (FLAGYL) IVPB 500 mg     500 mg 100 mL/hr over 60 Minutes Intravenous  Once 01/26/19 2232 01/27/19 0010       Objective: Vitals:   02/07/19 0418 02/07/19 0500 02/07/19 1438 02/07/19 1558  BP: (!) 166/76  (!) 160/104 (!) 152/60  Pulse: 63  66 60  Resp: 18  17   Temp: 98.4 F (36.9 C)  98 F (36.7 C)   TempSrc: Oral  Oral   SpO2: 96%  99%   Weight:  94.2 kg    Height:        Intake/Output Summary (Last 24 hours) at 02/07/2019 1645 Last data filed at 02/07/2019 1500 Gross per 24 hour  Intake 2167.3 ml  Output 35 ml  Net 2132.3 ml   Filed Weights   02/05/19 0500 02/06/19 0438 02/07/19 0500  Weight: 97.8 kg 93.3 kg 94.2 kg   Weight change: 0.9 kg  Body mass index is 36.79 kg/m.  Intake/Output from previous day: 07/07 0701 - 07/08 0700 In: 1716.6 [P.O.:480; I.V.:734.5; IV Piggyback:482.1] Out: 60 [Drains:60] Intake/Output this shift: Total I/O In: 1366.8 [P.O.:480; I.V.:273; IV Piggyback:613.8] Out: -   Examination:  General exam: Calm, comfortable, not in acute distress, anxious since hearing abt surgery\ HEENT:Oral mucosa moist, Ear/Nose WNL grossly, dentition normal. Respiratory system: Bilateral equal air entry, no crackles and wheezing, no use of accessory muscle, non tender on palpation. Cardiovascular system: regular rate and rhythm, S1 & S2 heard, No JVD/murmurs. Gastrointestinal system: Abdomen soft, mild tenderness in left lower quadrant, obese and bowel sounds present. Drains present  left lower quadrant and left gluteal area  Nervous System:Alert, awake and oriented at baseline. Non focal Extremities: No edema, distal peripheral pulses palpable.  Skin: No rashes,no icterus. MSK: Normal muscle bulk,tone, power  Medications:  Scheduled Meds: . aspirin EC  81 mg Oral Daily  . busPIRone  15 mg Oral TID  . docusate sodium  100 mg Oral Daily  . DULoxetine  60 mg Oral Daily  . enoxaparin (LOVENOX) injection  40 mg Subcutaneous Q24H  . feeding supplement  1 Container Oral TID BM  . guaiFENesin  600 mg Oral BID  . losartan  100 mg Oral Daily   And  . hydrochlorothiazide  25 mg Oral Daily  . metoprolol tartrate  25 mg Oral BID  . rosuvastatin  10 mg Oral Daily  . sodium chloride flush  5 mL Intracatheter Q8H  . sodium chloride flush  5 mL Intracatheter Q8H   Continuous Infusions: . 0.9 % NaCl with KCl 20 mEq / L 30 mL/hr at 02/07/19 0405  . fluconazole (DIFLUCAN) IV 400 mg (02/07/19 1021)  . piperacillin-tazobactam (ZOSYN)  IV 3.375 g (02/07/19 1314)    Data Reviewed: I have personally reviewed following labs and imaging studies  CBC: Recent Labs  Lab 02/03/19 0117 02/04/19 0834 02/05/19 0335 02/06/19 0220 02/07/19 0346  WBC 18.0* 14.5* 12.4* 13.1* 14.1*  HGB 11.0* 10.7* 11.3* 11.5* 12.6  HCT 33.1* 32.4* 34.4* 35.1* 38.1  MCV 84.0 83.7 84.5 85.2 83.9  PLT 290 295 295 317 973   Basic Metabolic Panel: Recent Labs  Lab 02/01/19 0527 02/02/19 0445 02/03/19 0117 02/04/19 0834 02/05/19 0335 02/06/19 0220 02/07/19 0346  NA 136 135 131* 135 135 136 136  K 3.0* 3.5 3.1* 3.4* 3.5 4.4 4.6  CL 99 97* 94* 97* 96* 97* 98  CO2 26 28 27 26 29 30 26   GLUCOSE 92 96 83 78 83 82 92  BUN <5* <5* <5* <5* <5* <5* <5*  CREATININE 0.42* 0.50 0.50 0.53 0.58 0.59 0.62  CALCIUM 7.7* 7.9* 7.9* 8.0* 8.3* 8.8* 8.9  MG 1.9 1.7 1.7  --   --   --   --    GFR: Estimated Creatinine Clearance: 83.6 mL/min (by C-G formula based on SCr of 0.62 mg/dL). Liver Function Tests: No  results for input(s): AST, ALT, ALKPHOS, BILITOT, PROT, ALBUMIN in the last 168 hours. No results for input(s): LIPASE, AMYLASE in the last 168 hours. No results for input(s): AMMONIA in the last 168 hours. Coagulation Profile: Recent Labs  Lab 02/03/19 0117  INR 1.2   Cardiac Enzymes: No results for input(s): CKTOTAL, CKMB, CKMBINDEX, TROPONINI in the last 168 hours. BNP (last 3 results) No results for input(s): PROBNP in the last 8760 hours. HbA1C: No results for input(s): HGBA1C in the last 72 hours. CBG: No results for input(s): GLUCAP in the last 168 hours. Lipid Profile: No results for input(s): CHOL, HDL, LDLCALC, TRIG, CHOLHDL, LDLDIRECT in the last 72 hours. Thyroid Function Tests: No results for input(s): TSH, T4TOTAL, FREET4, T3FREE, THYROIDAB in the last 72 hours. Anemia Panel: No results for input(s): VITAMINB12, FOLATE, FERRITIN, TIBC, IRON, RETICCTPCT in the last 72 hours. Sepsis Labs: No results for input(s): PROCALCITON, LATICACIDVEN in the last 168 hours.  Recent Results (from the past 240 hour(s))  Aerobic/Anaerobic Culture (surgical/deep wound)     Status: None   Collection Time: 01/29/19  2:19 PM   Specimen: Abscess  Result Value Ref Range Status   Specimen Description ABSCESS JP DRAINAGE  Final   Special Requests NONE  Final   Gram Stain   Final    ABUNDANT WBC PRESENT, PREDOMINANTLY PMN ABUNDANT GRAM POSITIVE COCCI    Culture   Final    FEW ESCHERICHIA COLI MODERATE VIRIDANS STREPTOCOCCUS FEW CANDIDA ALBICANS NO ANAEROBES ISOLATED Performed at [facility], [address] [place], [place] [address]    Report Status 02/03/2019 FINAL  Final   Organism ID, Bacteria ESCHERICHIA COLI  Final      Susceptibility   Escherichia coli - MIC*    AMPICILLIN <=2 SENSITIVE Sensitive     CEFAZOLIN <=4 SENSITIVE Sensitive     CEFEPIME <=1 SENSITIVE Sensitive     CEFTAZIDIME <=1 SENSITIVE Sensitive     CEFTRIAXONE <=1 SENSITIVE Sensitive      CIPROFLOXACIN <=0.25 SENSITIVE Sensitive     GENTAMICIN <=1 SENSITIVE Sensitive     IMIPENEM <=0.25 SENSITIVE Sensitive     TRIMETH/SULFA <=20 SENSITIVE Sensitive     AMPICILLIN/SULBACTAM <=2 SENSITIVE Sensitive     PIP/TAZO <=4 SENSITIVE Sensitive     Extended ESBL NEGATIVE Sensitive     *  FEW ESCHERICHIA COLI  Aerobic/Anaerobic Culture (surgical/deep wound)     Status: None (Preliminary result)   Collection Time: 02/03/19 10:21 AM   Specimen: Pelvis; Abscess  Result Value Ref Range Status   Specimen Description PELVIS  Final   Special Requests Immunocompromised  Final   Gram Stain   Final    ABUNDANT WBC PRESENT, PREDOMINANTLY PMN RARE GRAM POSITIVE COCCI IN PAIRS IN CHAINS    Culture   Final    NO GROWTH 4 DAYS NO ANAEROBES ISOLATED; CULTURE IN PROGRESS FOR 5 DAYS Performed at [facility], [place] [address] [place], [place] [address]    Report Status PENDING  Incomplete  Aerobic/Anaerobic Culture (surgical/deep wound)     Status: Abnormal (Preliminary result)   Collection Time: 02/03/19 10:22 AM   Specimen: Abscess  Result Value Ref Range Status   Specimen Description ABSCESS  Final   Special Requests Immunocompromised  Final   Gram Stain   Final    ABUNDANT WBC PRESENT, PREDOMINANTLY PMN ABUNDANT GRAM NEGATIVE RODS FEW GRAM POSITIVE COCCI IN CLUSTERS Performed at [facility], [address] [place], [place] [address]    Culture (A)  Final    MULTIPLE ORGANISMS PRESENT, NONE PREDOMINANT NO ANAEROBES ISOLATED; CULTURE IN PROGRESS FOR 5 DAYS    Report Status PENDING  Incomplete  Surgical pcr screen     Status: None   Collection Time: 02/07/19  6:43 AM   Specimen: Nasal Mucosa; Nasal Swab  Result Value Ref Range Status   MRSA, PCR NEGATIVE NEGATIVE Final   Staphylococcus aureus NEGATIVE NEGATIVE Final    Comment: (NOTE) The Xpert SA Assay (FDA approved for NASAL specimens in patients 55 years of age and older), is one component of a comprehensive  surveillance program. It is not intended to diagnose infection nor to guide or monitor treatment. Performed at [facility], [place] [address] [place], [place] [address]       Radiology Studies: No results found.    LOS: 11 days   Time spent: 32 minutes. More than 50% of that time was spent in counseling and/or coordination of care.  Demoni Gergen J [place] DO [facility]  02/07/2019, 4:45 PM

## 2019-02-08 ENCOUNTER — Inpatient Hospital Stay (HOSPITAL_COMMUNITY): Payer: No Typology Code available for payment source

## 2019-02-08 DIAGNOSIS — F4320 Adjustment disorder, unspecified: Secondary | ICD-10-CM

## 2019-02-08 LAB — CBC
HCT: 37.9 % (ref 36.0–46.0)
Hemoglobin: 12.6 g/dL (ref 12.0–15.0)
MCH: 27.9 pg (ref 26.0–34.0)
MCHC: 33.2 g/dL (ref 30.0–36.0)
MCV: 83.8 fL (ref 80.0–100.0)
Platelets: 358 10*3/uL (ref 150–400)
RBC: 4.52 MIL/uL (ref 3.87–5.11)
RDW: 13.5 % (ref 11.5–15.5)
WBC: 19.4 10*3/uL — ABNORMAL HIGH (ref 4.0–10.5)
nRBC: 0 % (ref 0.0–0.2)

## 2019-02-08 LAB — AEROBIC/ANAEROBIC CULTURE W GRAM STAIN (SURGICAL/DEEP WOUND): Culture: NO GROWTH

## 2019-02-08 LAB — BASIC METABOLIC PANEL
Anion gap: 10 (ref 5–15)
BUN: <5 mg/dL — ABNORMAL LOW (ref 6–20)
CO2: 26 mmol/L (ref 22–32)
Calcium: 8.5 mg/dL — ABNORMAL LOW (ref 8.9–10.3)
Chloride: 96 mmol/L — ABNORMAL LOW (ref 98–111)
Creatinine, Ser: 0.6 mg/dL (ref 0.44–1.00)
GFR calc Af Amer: >60 mL/min (ref >60)
GFR calc non Af Amer: >60 mL/min (ref >60)
Glucose, Bld: 139 mg/dL — ABNORMAL HIGH (ref 70–99)
Potassium: 2.8 mmol/L — ABNORMAL LOW (ref 3.5–5.1)
Sodium: 132 mmol/L — ABNORMAL LOW (ref 135–145)

## 2019-02-08 MED ORDER — POTASSIUM CHLORIDE CRYS ER 20 MEQ PO TBCR
40.0000 meq | EXTENDED_RELEASE_TABLET | ORAL | Status: AC
  Administered 2019-02-08 (×2): 40 meq via ORAL
  Filled 2019-02-08 (×2): qty 2

## 2019-02-08 MED ORDER — ALPRAZOLAM 0.25 MG PO TABS
0.2500 mg | ORAL_TABLET | Freq: Three times a day (TID) | ORAL | Status: DC | PRN
  Administered 2019-02-08: 0.25 mg via ORAL
  Filled 2019-02-08 (×2): qty 1

## 2019-02-08 MED ORDER — POTASSIUM CHLORIDE CRYS ER 20 MEQ PO TBCR
40.0000 meq | EXTENDED_RELEASE_TABLET | Freq: Two times a day (BID) | ORAL | Status: DC
  Administered 2019-02-08 – 2019-02-10 (×3): 40 meq via ORAL
  Filled 2019-02-08 (×3): qty 2

## 2019-02-08 MED ORDER — IOHEXOL 300 MG/ML  SOLN
100.0000 mL | Freq: Once | INTRAMUSCULAR | Status: AC | PRN
  Administered 2019-02-08: 100 mL via INTRAVENOUS

## 2019-02-08 NOTE — Progress Notes (Signed)
[facility]/Trauma Progress Note      Assessment/Plan Sigmoid diverticulitis with3.8 cmabscess, admission 06/27 -3-4prior episodes of diverticulitis, none that required hospitalization  - Last colonoscopy 2013 by Dr. Hilarie Fredrickson showed mild diverticulosis in the left colon -S/Pdrain placement06/25for the abscess, 07/04 s/p redrainage due to drain migration -trying to avoid surgery but pt is not improving enough with medical management. Will determine tomorrow am based on CT scan, CBC and pain if pt needs OR tomorrow.   -Continue drain, IV antibiotics. Will continue to follow. - am labs  ID -cipro/flagyl 6/26-06/30; Zosyn 06/30>>WBCup to 19.4 from 14.1 yesterday VTE -SCDs, sq heparin FEN -soft diet, NPO at midnight Foley -none Follow up -TBD  Plan: CT scan today, CBC in am. Will decide if pt needs to go to the OR tomorrow morning. NPO at midnight.    LOS: 12 days    Subjective: CC: mild abdominal pain  Pt states pain at LLQ drain site since dressing change and flush. She states the nurse saw some drainage at the drain site this am. She denies nausea, vomiting fever or chills. She is having very loose stools. No increase pain with stools. No increase pain with soft diet. Spoke with daughter Joellen Jersey, on the phone this am.   Objective: Vital signs in last 24 hours: Temp:  [98 F (36.7 C)-98.5 F (36.9 C)] 98.5 F (36.9 C) (07/09 0409) Pulse Rate:  [60-68] 68 (07/09 0409) Resp:  [17-20] 20 (07/09 0409) BP: (152-168)/(60-104) 168/78 (07/09 0409) SpO2:  [93 %-99 %] 93 % (07/09 0409) Weight:  [91.8 kg] 91.8 kg (07/09 0236) Last BM Date: 02/08/19  Intake/Output from previous day: 07/08 0701 - 07/09 0700 In: 1394.8 [P.O.:480; I.V.:283; IV Piggyback:613.8] Out: 16 [Drains:16] Intake/Output this shift: No intake/output data recorded.  PE: Gen: Alert, NAD, pleasant, cooperative Pulm:Rate andeffort normal Abd: Soft,no distention, +BS,TTP in LLQ  and suprapubic region withguarding, no peritonitis, drain in LLQ with serosanguinous drainage, posterior drain with cloudy serous drainage, purulent drainage from site of LLQ drain with increased TTP Skin: no rashes noted, warm and dry Neuro: alert and oriented.   Anti-infectives: Anti-infectives (From admission, onward)   Start     Dose/Rate Route Frequency Ordered Stop   02/02/19 1415  fluconazole (DIFLUCAN) IVPB 400 mg     400 mg 100 mL/hr over 120 Minutes Intravenous Every 24 hours 02/02/19 1408     01/30/19 1200  piperacillin-tazobactam (ZOSYN) IVPB 3.375 g     3.375 g 12.5 mL/hr over 240 Minutes Intravenous Every 8 hours 01/30/19 1045     01/27/19 1015  ciprofloxacin (CIPRO) IVPB 400 mg  Status:  Discontinued     400 mg 200 mL/hr over 60 Minutes Intravenous Every 12 hours 01/27/19 0117 01/30/19 1039   01/27/19 0700  metroNIDAZOLE (FLAGYL) IVPB 500 mg  Status:  Discontinued     500 mg 100 mL/hr over 60 Minutes Intravenous Every 8 hours 01/27/19 0117 01/30/19 1039   01/26/19 2245  ciprofloxacin (CIPRO) IVPB 400 mg     400 mg 200 mL/hr over 60 Minutes Intravenous  Once 01/26/19 2232 01/27/19 0026   01/26/19 2245  metroNIDAZOLE (FLAGYL) IVPB 500 mg     500 mg 100 mL/hr over 60 Minutes Intravenous  Once 01/26/19 2232 01/27/19 0010      Lab Results:  Recent Labs    02/07/19 0346 02/08/19 0339  WBC 14.1* 19.4*  HGB 12.6 12.6  HCT 38.1 37.9  PLT 366 358   BMET Recent Labs  02/07/19 0346 02/08/19 0339  NA 136 132*  K 4.6 2.8*  CL 98 96*  CO2 26 26  GLUCOSE 92 139*  BUN <5* <5*  CREATININE 0.62 0.60  CALCIUM 8.9 8.5*   PT/INR No results for input(s): LABPROT, INR in the last 72 hours. CMP     Component Value Date/Time   NA 132 (L) 02/08/2019 0339   NA 140 01/25/2019 0924   K 2.8 (L) 02/08/2019 0339   CL 96 (L) 02/08/2019 0339   CO2 26 02/08/2019 0339   GLUCOSE 139 (H) 02/08/2019 0339   BUN <5 (L) 02/08/2019 0339   BUN 8 01/25/2019 0924   CREATININE  0.60 02/08/2019 0339   CALCIUM 8.5 (L) 02/08/2019 0339   PROT 5.2 (L) 01/30/2019 0224   PROT 6.8 01/25/2019 0924   ALBUMIN 2.1 (L) 01/30/2019 0224   ALBUMIN 4.0 01/25/2019 0924   AST 9 (L) 01/30/2019 0224   ALT 8 01/30/2019 0224   ALKPHOS 66 01/30/2019 0224   BILITOT 0.6 01/30/2019 0224   BILITOT 0.4 01/25/2019 0924   GFRNONAA >60 02/08/2019 0339   GFRAA >60 02/08/2019 0339   Lipase     Component Value Date/Time   LIPASE 26 01/26/2019 2032    Studies/Results: No results found.    Kalman Drape , [facility] 02/08/2019, 8:27 AM  Pager: [phone] Mon-Wed, Friday 7:00am-4:30pm Thurs 7am-11:30am  Consults: [phone]

## 2019-02-08 NOTE — Plan of Care (Signed)
  Problem: Coping: Goal: Level of anxiety will decrease Outcome: Progressing   Problem: Pain Managment: Goal: General experience of comfort will improve Outcome: Progressing   

## 2019-02-08 NOTE — Progress Notes (Signed)
Pharmacy Antibiotic Note  Heather Lam is a 58 y.o. female admitted on 01/26/2019 with sigmoid diverticulitis with abscess . Abscess is growing pan-S e.coli, strep viridans, and candida albicans. Cultures from abscess on 7/5 still growing GNR and Strep. D/W MD 7/6, due to persistently positive cultures will continue current abx for now.   Pt not improving, WBC 12>19.4, and CT scan today to decide for surgery tomorrow.  Renal function remains stable, SCr 0.60 CrCl ~ 82 ml/min  Plan: Continue zosyn 3.375g IV every 8 hours Continue fluconazole 400mg  IV every 24 hours F/u CT results and surgery plan for LOT  Height: 5\' 3"  (160 cm) Weight: 202 lb 6.1 oz (91.8 kg) IBW/kg (Calculated) : 52.4  Temp (24hrs), Avg:98.2 F (36.8 C), Min:98 F (36.7 C), Max:98.5 F (36.9 C)  Recent Labs  Lab 02/04/19 0834 02/05/19 0335 02/06/19 0220 02/07/19 0346 02/08/19 0339  WBC 14.5* 12.4* 13.1* 14.1* 19.4*  CREATININE 0.53 0.58 0.59 0.62 0.60    Estimated Creatinine Clearance: 82.5 mL/min (by C-G formula based on SCr of 0.6 mg/dL).    No Known Allergies  Antimicrobials this admission:  6/26 Cipro >> 6/30 6/26 Flagyl >> 6/30 6/30 Zosyn >>  7/4 Fluconazole >>  Dose adjustments this admission:  --  Microbiology results:  6/26 COVID: negative  6/29 abscess, JP drainage: Pan-S e.coli, strep viridans, and candida albicans 7/5: Abscess culture: Multiple organisms, no anaerobes   Bertis Ruddy, PharmD Clinical Pharmacist Please check AMION for all [place] numbers 02/08/2019 9:14 AM

## 2019-02-08 NOTE — Progress Notes (Signed)
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 sae pcp 1 day PTA  For ongoing diarrhea and abdominal pain X5 DAYS-and was diagnosed clinically as diverticulitis and started on Augmentin and Flagyl. Patient says that she did not feel better and in fact her bloating got worse with significant worsening of abdominal pain.   In the ER, CT scan of the abdomen pelvis showed acute complicated sigmoid diverticulitis- 3.8 cm abscess adjacent to sigmoid colon. Small to moderate volume of fluid within the pelvis with questionable rim enhancement.  General surgery  CCS was consulted advised IV antibiotics and IR drainage of the abscess and was admitted.  Patient had low-grade fever and nonbloody diarrhea.   Subjective: Patient seen and examined at bedside, resting comfortably.  States tolerating soft diet and abdominal discomfort has continued to improve.  Less output from her 2 abdominal drains.  WBC count continues to trend up from 14.1 to 19.4 today.  No other complaints at this time.  Denies headache, no fever/chills/night sweats, no nausea/vomiting/diarrhea, no chest pain, no palpitations, no shortness of breath, no abdominal discomfort, no cough/congestion, no issues with bowel/bladder function, no paresthesias.  No acute events overnight per nursing staff.  Assessment & Plan:  Acute sigmoid diverticulitis with abscess:  Pt with 4-5 prior episodes of diverticulitis that did not require hospitalization.  Colonoscopy abt 5 yrs diverticulosis. S/P dain placement 6/29 for abscess. WBC was trending up, repeat CT 7/3 persistent acute left lower quadrant sigmoid diverticulitis with 2 pelvic abscesses, left lower quadrant abscess drain was retracted into  the pericolonic fat.  S/p replacement of  LLQ drain and new pelvic drain 7/4.  Drain having serosanguineous output.   --General surgery following, appreciate assistance --Patient's abdominal pain improved, WBC count trending up 13.1-->14.1-->19.4 --Left abdominal drain with 8 mL's past 24 hours, left buttock drain 8 mL's past 24 hours --Tolerating soft diet --Continues on Zosyn for empiric antimicrobial coverage --General surgery ordered CT abdomen/pelvis with contrast for evaluation given elevated WBC count3 --N.p.o. after midnight for possible surgical procedure tomorrow  Chronic diastolic CHF:  Compensated. Pt retaining fluid but no shortness of breath.  --Continue HCTZ.    Swelling of the right upper extremity/and right lower leg.   Swelling is improving-cont HCTZ.  U/S duplex negative for DVT.     Anxiety/depression  Continue home Xanax, buspirone, Cymbalta and trazodone.    Essential hypertension: BP fairly controlled on Metoprolol, HCTZ losartan  CAD with prior stent: Cont asa.  Prediabetes: A1c 5.8.  We will advise her to follow-up with PCP, for now continue on diet control.  HLD:  on statins  OSA: On Nocturnal CPAP but patient has been refusing.    Hypokalemia:  Potassium 2.8 this morning.  Repleted. --Repeat potassium in the a.m. to include magnesium  DVT prophylaxis:  High risk for DVT on Lovenox  Code Status: Full Code Family Communication:  none Disposition Plan: Remains inpatient pending clinical improvement.  CT abdomen/pelvis pending.  Possible surgical intervention tomorrow.  N.p.o. after midnight   Consultants:  CCS  Procedures:  DRAIN PLACEMENT FOR ABSCESS Left Abd Drain replacement and new drain ln left gluteal 7/4  Antimicrobials: Anti-infectives (From admission, onward)   Start     Dose/Rate Route Frequency  Ordered Stop   02/02/19 1415  fluconazole (DIFLUCAN) IVPB 400 mg     400 mg 100 mL/hr over 120 Minutes Intravenous Every 24 hours  02/02/19 1408     01/30/19 1200  piperacillin-tazobactam (ZOSYN) IVPB 3.375 g     3.375 g 12.5 mL/hr over 240 Minutes Intravenous Every 8 hours 01/30/19 1045     01/27/19 1015  ciprofloxacin (CIPRO) IVPB 400 mg  Status:  Discontinued     400 mg 200 mL/hr over 60 Minutes Intravenous Every 12 hours 01/27/19 0117 01/30/19 1039   01/27/19 0700  metroNIDAZOLE (FLAGYL) IVPB 500 mg  Status:  Discontinued     500 mg 100 mL/hr over 60 Minutes Intravenous Every 8 hours 01/27/19 0117 01/30/19 1039   01/26/19 2245  ciprofloxacin (CIPRO) IVPB 400 mg     400 mg 200 mL/hr over 60 Minutes Intravenous  Once 01/26/19 2232 01/27/19 0026   01/26/19 2245  metroNIDAZOLE (FLAGYL) IVPB 500 mg     500 mg 100 mL/hr over 60 Minutes Intravenous  Once 01/26/19 2232 01/27/19 0010       Objective: Vitals:   02/07/19 1558 02/07/19 2052 02/08/19 0236 02/08/19 0409  BP: (!) 152/60 (!) 168/68  (!) 168/78  Pulse: 60 67  68  Resp:  20  20  Temp:  98 F (36.7 C)  98.5 F (36.9 C)  TempSrc:  Oral  Oral  SpO2:  94%  93%  Weight:   91.8 kg   Height:        Intake/Output Summary (Last 24 hours) at 02/08/2019 1250 Last data filed at 02/08/2019 0528 Gross per 24 hour  Intake 1154.75 ml  Output 16 ml  Net 1138.75 ml   Filed Weights   02/06/19 0438 02/07/19 0500 02/08/19 0236  Weight: 93.3 kg 94.2 kg 91.8 kg   Weight change: -2.4 kg  Body mass index is 35.85 kg/m.  Intake/Output from previous day: 07/08 0701 - 07/09 0700 In: 1394.8 [P.O.:480; I.V.:283; IV Piggyback:613.8] Out: 16 [Drains:16] Intake/Output this shift: No intake/output data recorded.  Examination:  General exam: Calm, comfortable, not in acute distress, anxious since hearing abt surgery\ HEENT:Oral mucosa moist, Ear/Nose WNL grossly, dentition normal. Respiratory system: Bilateral equal air entry, no crackles and wheezing, no use of accessory muscle, non tender on palpation. Cardiovascular system: regular rate and rhythm, S1 & S2 heard, No  JVD/murmurs. Gastrointestinal system: Abdomen soft, mild tenderness in left lower quadrant, obese and bowel sounds present. Drains present left lower quadrant and left gluteal area  Nervous System:Alert, awake and oriented at baseline. Non focal Extremities: No edema, distal peripheral pulses palpable.  Skin: No rashes,no icterus. MSK: Normal muscle bulk,tone, power  Medications:  Scheduled Meds: . aspirin EC  81 mg Oral Daily  . busPIRone  15 mg Oral TID  . docusate sodium  100 mg Oral Daily  . DULoxetine  60 mg Oral Daily  . enoxaparin (LOVENOX) injection  40 mg Subcutaneous Q24H  . feeding supplement  1 Container Oral TID BM  . guaiFENesin  600 mg Oral BID  . losartan  100 mg Oral Daily   And  . hydrochlorothiazide  25 mg Oral Daily  . metoprolol tartrate  25 mg Oral BID  . potassium chloride  40 mEq Oral BID  . rosuvastatin  10 mg Oral Daily  . sodium chloride flush  10-40 mL Intracatheter Q12H  . sodium chloride flush  5 mL Intracatheter Q8H  . sodium chloride flush  5 mL Intracatheter Q8H  Continuous Infusions: . fluconazole (DIFLUCAN) IV 400 mg (02/08/19 1031)  . piperacillin-tazobactam (ZOSYN)  IV 3.375 g (02/08/19 0347)    Data Reviewed: I have personally reviewed following labs and imaging studies  CBC: Recent Labs  Lab 02/04/19 0834 02/05/19 0335 02/06/19 0220 02/07/19 0346 02/08/19 0339  WBC 14.5* 12.4* 13.1* 14.1* 19.4*  HGB 10.7* 11.3* 11.5* 12.6 12.6  HCT 32.4* 34.4* 35.1* 38.1 37.9  MCV 83.7 84.5 85.2 83.9 83.8  PLT 295 295 317 366 858   Basic Metabolic Panel: Recent Labs  Lab 02/02/19 0445 02/03/19 0117 02/04/19 0834 02/05/19 0335 02/06/19 0220 02/07/19 0346 02/08/19 0339  NA 135 131* 135 135 136 136 132*  K 3.5 3.1* 3.4* 3.5 4.4 4.6 2.8*  CL 97* 94* 97* 96* 97* 98 96*  CO2 28 27 26 29 30 26 26   GLUCOSE 96 83 78 83 82 92 139*  BUN <5* <5* <5* <5* <5* <5* <5*  CREATININE 0.50 0.50 0.53 0.58 0.59 0.62 0.60  CALCIUM 7.9* 7.9* 8.0* 8.3*  8.8* 8.9 8.5*  MG 1.7 1.7  --   --   --   --   --    GFR: Estimated Creatinine Clearance: 82.5 mL/min (by C-G formula based on SCr of 0.6 mg/dL). Liver Function Tests: No results for input(s): AST, ALT, ALKPHOS, BILITOT, PROT, ALBUMIN in the last 168 hours. No results for input(s): LIPASE, AMYLASE in the last 168 hours. No results for input(s): AMMONIA in the last 168 hours. Coagulation Profile: Recent Labs  Lab 02/03/19 0117  INR 1.2   Cardiac Enzymes: No results for input(s): CKTOTAL, CKMB, CKMBINDEX, TROPONINI in the last 168 hours. BNP (last 3 results) No results for input(s): PROBNP in the last 8760 hours. HbA1C: No results for input(s): HGBA1C in the last 72 hours. CBG: No results for input(s): GLUCAP in the last 168 hours. Lipid Profile: No results for input(s): CHOL, HDL, LDLCALC, TRIG, CHOLHDL, LDLDIRECT in the last 72 hours. Thyroid Function Tests: No results for input(s): TSH, T4TOTAL, FREET4, T3FREE, THYROIDAB in the last 72 hours. Anemia Panel: No results for input(s): VITAMINB12, FOLATE, FERRITIN, TIBC, IRON, RETICCTPCT in the last 72 hours. Sepsis Labs: No results for input(s): PROCALCITON, LATICACIDVEN in the last 168 hours.  Recent Results (from the past 240 hour(s))  Aerobic/Anaerobic Culture (surgical/deep wound)     Status: None   Collection Time: 01/29/19  2:19 PM   Specimen: Abscess  Result Value Ref Range Status   Specimen Description ABSCESS JP DRAINAGE  Final   Special Requests NONE  Final   Gram Stain   Final    ABUNDANT WBC PRESENT, PREDOMINANTLY PMN ABUNDANT GRAM POSITIVE COCCI    Culture   Final    FEW ESCHERICHIA COLI MODERATE VIRIDANS STREPTOCOCCUS FEW CANDIDA ALBICANS NO ANAEROBES ISOLATED Performed at [facility], [address] [place], [place] [address]    Report Status 02/03/2019 FINAL  Final   Organism ID, Bacteria ESCHERICHIA COLI  Final      Susceptibility   Escherichia coli - MIC*    AMPICILLIN <=2 SENSITIVE  Sensitive     CEFAZOLIN <=4 SENSITIVE Sensitive     CEFEPIME <=1 SENSITIVE Sensitive     CEFTAZIDIME <=1 SENSITIVE Sensitive     CEFTRIAXONE <=1 SENSITIVE Sensitive     CIPROFLOXACIN <=0.25 SENSITIVE Sensitive     GENTAMICIN <=1 SENSITIVE Sensitive     IMIPENEM <=0.25 SENSITIVE Sensitive     TRIMETH/SULFA <=20 SENSITIVE Sensitive     AMPICILLIN/SULBACTAM <=2 SENSITIVE  Sensitive     PIP/TAZO <=4 SENSITIVE Sensitive     Extended ESBL NEGATIVE Sensitive     * FEW ESCHERICHIA COLI  Aerobic/Anaerobic Culture (surgical/deep wound)     Status: None (Preliminary result)   Collection Time: 02/03/19 10:21 AM   Specimen: Pelvis; Abscess  Result Value Ref Range Status   Specimen Description PELVIS  Final   Special Requests Immunocompromised  Final   Gram Stain   Final    ABUNDANT WBC PRESENT, PREDOMINANTLY PMN RARE GRAM POSITIVE COCCI IN PAIRS IN CHAINS    Culture   Final    NO GROWTH 4 DAYS NO ANAEROBES ISOLATED; CULTURE IN PROGRESS FOR 5 DAYS Performed at [facility], [place] [address] [place], [place] [address]    Report Status PENDING  Incomplete  Aerobic/Anaerobic Culture (surgical/deep wound)     Status: Abnormal (Preliminary result)   Collection Time: 02/03/19 10:22 AM   Specimen: Abscess  Result Value Ref Range Status   Specimen Description ABSCESS  Final   Special Requests Immunocompromised  Final   Gram Stain   Final    ABUNDANT WBC PRESENT, PREDOMINANTLY PMN ABUNDANT GRAM NEGATIVE RODS FEW GRAM POSITIVE COCCI IN CLUSTERS Performed at [facility], [address] [place], [place] [address]    Culture (A)  Final    MULTIPLE ORGANISMS PRESENT, NONE PREDOMINANT NO ANAEROBES ISOLATED; CULTURE IN PROGRESS FOR 5 DAYS    Report Status PENDING  Incomplete  Surgical pcr screen     Status: None   Collection Time: 02/07/19  6:43 AM   Specimen: Nasal Mucosa; Nasal Swab  Result Value Ref Range Status   MRSA, PCR NEGATIVE NEGATIVE Final   Staphylococcus aureus NEGATIVE  NEGATIVE Final    Comment: (NOTE) The Xpert SA Assay (FDA approved for NASAL specimens in patients 64 years of age and older), is one component of a comprehensive surveillance program. It is not intended to diagnose infection nor to guide or monitor treatment. Performed at [facility], [place] [address] [place], [place] [address]       Radiology Studies: No results found.    LOS: 12 days   Time spent: 32 minutes. More than 50% of that time was spent in counseling and/or coordination of care.  Eric J [place] DO [facility]  02/08/2019, 12:50 PM

## 2019-02-08 NOTE — Progress Notes (Signed)
Referring Physician(s): Dr Jeanmarie Hubert  Supervising Physician: Markus Daft  Patient Status:  [facility] - In-pt  Chief Complaint:  Sigmoid diverticulitis Abscess LLW drain placed 6/29 Then replaced 7/4 and new TG drain placed 7/4  Subjective:  Better today Less OP from Both drains Less abd pain-- but still tender  Allergies: Patient has no known allergies.  Medications: Prior to Admission medications   Medication Sig Start Date End Date Taking? Authorizing Provider  ALPRAZolam (XANAX) 0.25 MG tablet Take 1 tablet (0.25 mg total) by mouth daily as needed for anxiety. 08/07/18  Yes Terald Sleeper, PA-C  aspirin EC 325 MG tablet Take 325 mg by mouth daily.   Yes [provider]  busPIRone (BUSPAR) 15 MG tablet Take 1 tablet (15 mg total) by mouth 3 (three) times daily. 08/07/18  Yes Terald Sleeper, PA-C  Cholecalciferol 2000 units CAPS Take 1 capsule (2,000 Units total) by mouth daily. 08/17/16  Yes Beasley, Caren D, MD  Coenzyme Q10 (CO Q10) 100 MG CAPS Take 1 capsule by mouth daily.   Yes [provider]  DULoxetine (CYMBALTA) 30 MG capsule Take 2 capsules (60 mg total) by mouth daily. 01/01/19  Yes Terald Sleeper, PA-C  losartan-hydrochlorothiazide (HYZAAR) 100-25 MG tablet Take 1 tablet by mouth daily. 07/24/18  Yes Terald Sleeper, PA-C  metoprolol tartrate (LOPRESSOR) 25 MG tablet TAKE 1 TABLET BY MOUTH 2 TIMES DAILY. Patient taking differently: Take 25 mg by mouth 2 (two) times daily.  10/24/18  Yes Terald Sleeper, PA-C  metroNIDAZOLE (FLAGYL) 500 MG tablet Take 1 tablet (500 mg total) by mouth 3 (three) times daily. 01/25/19  Yes Gottschalk, Ashly M, DO  nitroGLYCERIN (NITROSTAT) 0.4 MG SL tablet 1 TABLET UNDER TONGUE EVERY 5 MINUTES UP TO 3 TIMES FOR CHEST PAIN THEN CALL DR IF NO RELIEF Patient taking differently: Place 0.4 mg under the tongue every 5 (five) minutes as needed for chest pain. 1 TABLET UNDER TONGUE EVERY 5 MINUTES UP TO 3 TIMES FOR CHEST PAIN THEN CALL  DR IF NO RELIEF 02/23/18  Yes Terald Sleeper, PA-C  rosuvastatin (CRESTOR) 10 MG tablet Take 1 tablet (10 mg total) by mouth daily. 07/03/18  Yes Terald Sleeper, PA-C  terbinafine (LAMISIL) 250 MG tablet Take 1 tablet (250 mg total) by mouth daily. 12/19/18  Yes Terald Sleeper, PA-C  traZODone (DESYREL) 50 MG tablet Take 1-2 tablets (50-100 mg total) by mouth at bedtime as needed for sleep. 05/03/18  Yes Jones, Angel S, PA-C  VASCEPA 1 g CAPS TAKE 2 CAPULES BY MOUTH TWICE DAILY Patient taking differently: Take 2 capsules by mouth 2 (two) times a day.  07/11/18  Yes Terald Sleeper, PA-C  Vitamin D, Ergocalciferol, (DRISDOL) 1.25 MG (50000 UT) CAPS capsule Take 1 capsule (50,000 Units total) by mouth every 7 (seven) days. Patient taking differently: Take 50,000 Units by mouth every Sunday.  08/30/18  Yes Terald Sleeper, PA-C  econazole nitrate 1 % cream Apply topically 2 (two) times daily. 12/19/18   Terald Sleeper, PA-C     Vital Signs: BP (!) 168/78 (BP Location: Left Arm)   Pulse 68   Temp 98.5 F (36.9 C) (Oral)   Resp 20   Ht 5\' 3"  (1.6 m)   Wt 202 lb 6.1 oz (91.8 kg)   LMP 12/31/2013   SpO2 93%   BMI 35.85 kg/m   Physical Exam Vitals signs reviewed.  Skin:    General: Skin is  warm and dry.     Comments: Sites are clean and dry Tender OP from anterior drain-- brownish fluid OP from TG drain more blood tinged Both 10 cc overnight  NGTD-- re incubated  Neurological:     Mental Status: She is alert and oriented to person, place, and time.     Imaging: No results found.  Labs:  CBC: Recent Labs    02/05/19 0335 02/06/19 0220 02/07/19 0346 02/08/19 0339  WBC 12.4* 13.1* 14.1* 19.4*  HGB 11.3* 11.5* 12.6 12.6  HCT 34.4* 35.1* 38.1 37.9  PLT 295 317 366 358    COAGS: Recent Labs    01/26/19 2032 01/28/19 0542 02/03/19 0117  INR 1.0 1.3* 1.2    BMP: Recent Labs    02/05/19 0335 02/06/19 0220 02/07/19 0346 02/08/19 0339  NA 135 136 136 132*  K 3.5 4.4  4.6 2.8*  CL 96* 97* 98 96*  CO2 29 30 26 26   GLUCOSE 83 82 92 139*  BUN <5* <5* <5* <5*  CALCIUM 8.3* 8.8* 8.9 8.5*  CREATININE 0.58 0.59 0.62 0.60  GFRNONAA >60 >60 >60 >60  GFRAA >60 >60 >60 >60    LIVER FUNCTION TESTS: Recent Labs    01/27/19 0718 01/28/19 0542 01/29/19 0557 01/30/19 0224  BILITOT 0.6 0.4 0.2* 0.6  AST 10* 12* 10* 9*  ALT 12 11 9 8   ALKPHOS 53 63 69 66  PROT 5.9* 6.3* 5.5* 5.2*  ALBUMIN 2.8* 3.0* 2.2* 2.1*    Assessment and Plan:  Diverticular abscess Drains x 2 For CT today-- plan per [facility] - ?OR? If home with drain we will follow and see pt as OP Let us know  Electronically Signed: Lavonia Drafts, PA-C 02/08/2019, 11:10 AM   I spent a total of 15 Minutes at the the patient's bedside AND on the patient's hospital floor or unit, greater than 50% of which was counseling/coordinating care for diverticular abscess

## 2019-02-09 ENCOUNTER — Encounter (HOSPITAL_COMMUNITY): Payer: Self-pay | Admitting: Orthopedic Surgery

## 2019-02-09 ENCOUNTER — Inpatient Hospital Stay (HOSPITAL_COMMUNITY): Payer: No Typology Code available for payment source | Admitting: Certified Registered Nurse Anesthetist

## 2019-02-09 ENCOUNTER — Encounter (HOSPITAL_COMMUNITY)
Admission: EM | Disposition: A | Discharge status: Home Health | Payer: Self-pay | Source: Home / Self Care | Attending: Internal Medicine

## 2019-02-09 HISTORY — PX: COLECTOMY WITH COLOSTOMY CREATION/HARTMANN PROCEDURE: SHX6598

## 2019-02-09 HISTORY — PX: APPLICATION OF WOUND VAC: SHX5189

## 2019-02-09 HISTORY — PX: COLOSTOMY: SHX63

## 2019-02-09 LAB — TYPE AND SCREEN
ABO/RH(D): O POS
Antibody Screen: NEGATIVE

## 2019-02-09 LAB — BASIC METABOLIC PANEL
Anion gap: 11 (ref 5–15)
BUN: 6 mg/dL (ref 6–20)
CO2: 24 mmol/L (ref 22–32)
Calcium: 8.4 mg/dL — ABNORMAL LOW (ref 8.9–10.3)
Chloride: 100 mmol/L (ref 98–111)
Creatinine, Ser: 0.57 mg/dL (ref 0.44–1.00)
GFR calc Af Amer: >60 mL/min (ref >60)
GFR calc non Af Amer: >60 mL/min (ref >60)
Glucose, Bld: 107 mg/dL — ABNORMAL HIGH (ref 70–99)
Potassium: 3.5 mmol/L (ref 3.5–5.1)
Sodium: 135 mmol/L (ref 135–145)

## 2019-02-09 LAB — CBC
HCT: 37.2 % (ref 36.0–46.0)
Hemoglobin: 12.1 g/dL (ref 12.0–15.0)
MCH: 27.5 pg (ref 26.0–34.0)
MCHC: 32.5 g/dL (ref 30.0–36.0)
MCV: 84.5 fL (ref 80.0–100.0)
Platelets: 329 10*3/uL (ref 150–400)
RBC: 4.4 MIL/uL (ref 3.87–5.11)
RDW: 13.7 % (ref 11.5–15.5)
WBC: 17.2 10*3/uL — ABNORMAL HIGH (ref 4.0–10.5)
nRBC: 0 % (ref 0.0–0.2)

## 2019-02-09 LAB — ABO/RH: ABO/RH(D): O POS

## 2019-02-09 LAB — MAGNESIUM: Magnesium: 1.5 mg/dL — ABNORMAL LOW (ref 1.7–2.4)

## 2019-02-09 SURGERY — COLECTOMY, WITH COLOSTOMY CREATION
Anesthesia: General | Site: Abdomen

## 2019-02-09 MED ORDER — OXYCODONE HCL 5 MG PO TABS
5.0000 mg | ORAL_TABLET | ORAL | Status: DC | PRN
  Administered 2019-02-09: 10 mg via ORAL
  Filled 2019-02-09: qty 2

## 2019-02-09 MED ORDER — KETAMINE HCL 50 MG/5ML IJ SOSY
PREFILLED_SYRINGE | INTRAMUSCULAR | Status: AC
  Filled 2019-02-09: qty 5

## 2019-02-09 MED ORDER — SUCCINYLCHOLINE CHLORIDE 200 MG/10ML IV SOSY
PREFILLED_SYRINGE | INTRAVENOUS | Status: DC | PRN
  Administered 2019-02-09: 120 mg via INTRAVENOUS

## 2019-02-09 MED ORDER — ONDANSETRON HCL 4 MG/2ML IJ SOLN
INTRAMUSCULAR | Status: AC
  Filled 2019-02-09: qty 2

## 2019-02-09 MED ORDER — MAGNESIUM SULFATE 2 GM/50ML IV SOLN
2.0000 g | INTRAVENOUS | Status: AC
  Administered 2019-02-09: 2 g via INTRAVENOUS
  Filled 2019-02-09 (×2): qty 50

## 2019-02-09 MED ORDER — SODIUM CHLORIDE 0.9% FLUSH: 9.0000 mL | INTRAVENOUS | Status: DC | PRN

## 2019-02-09 MED ORDER — PROPOFOL 500 MG/50ML IV EMUL
INTRAVENOUS | Status: AC
  Filled 2019-02-09: qty 50

## 2019-02-09 MED ORDER — PHENYLEPHRINE 40 MCG/ML (10ML) SYRINGE FOR IV PUSH (FOR BLOOD PRESSURE SUPPORT)
PREFILLED_SYRINGE | INTRAVENOUS | Status: DC | PRN
  Administered 2019-02-09: 40 ug via INTRAVENOUS
  Administered 2019-02-09: 80 ug via INTRAVENOUS

## 2019-02-09 MED ORDER — ONDANSETRON HCL 4 MG/2ML IJ SOLN
INTRAMUSCULAR | Status: DC | PRN
  Administered 2019-02-09: 4 mg via INTRAVENOUS

## 2019-02-09 MED ORDER — MIDAZOLAM HCL 5 MG/5ML IJ SOLN
INTRAMUSCULAR | Status: DC | PRN
  Administered 2019-02-09: 2 mg via INTRAVENOUS

## 2019-02-09 MED ORDER — LACTATED RINGERS IV SOLN
INTRAVENOUS | Status: DC | PRN
  Administered 2019-02-09: 11:00:00 via INTRAVENOUS

## 2019-02-09 MED ORDER — DIPHENHYDRAMINE HCL 50 MG/ML IJ SOLN: 12.5000 mg | Freq: Four times a day (QID) | INTRAMUSCULAR | Status: DC | PRN

## 2019-02-09 MED ORDER — HYDROMORPHONE 1 MG/ML IV SOLN
INTRAVENOUS | Status: DC
  Administered 2019-02-09: 30 mg via INTRAVENOUS
  Administered 2019-02-10: 1.2 mg via INTRAVENOUS
  Administered 2019-02-10: 1.8 mg via INTRAVENOUS
  Administered 2019-02-10: 2.4 mg via INTRAVENOUS
  Administered 2019-02-10: 1.5 mg via INTRAVENOUS
  Administered 2019-02-10: 1.8 mg via INTRAVENOUS
  Administered 2019-02-10: 3.72 mg via INTRAVENOUS
  Administered 2019-02-11: 1.2 mg via INTRAVENOUS
  Administered 2019-02-11: 0.9 mg via INTRAVENOUS
  Administered 2019-02-11: 1.2 mg via INTRAVENOUS
  Administered 2019-02-11: 0.9 mg via INTRAVENOUS
  Administered 2019-02-11: 1.2 mg via INTRAVENOUS
  Administered 2019-02-12: 0.9 mg via INTRAVENOUS
  Filled 2019-02-09 (×2): qty 30

## 2019-02-09 MED ORDER — ACETAMINOPHEN 10 MG/ML IV SOLN
INTRAVENOUS | Status: AC
  Filled 2019-02-09: qty 100

## 2019-02-09 MED ORDER — FENTANYL CITRATE (PF) 250 MCG/5ML IJ SOLN
INTRAMUSCULAR | Status: DC | PRN
  Administered 2019-02-09 (×2): 50 ug via INTRAVENOUS
  Administered 2019-02-09: 100 ug via INTRAVENOUS
  Administered 2019-02-09: 50 ug via INTRAVENOUS

## 2019-02-09 MED ORDER — POTASSIUM CHLORIDE 10 MEQ/100ML IV SOLN
10.0000 meq | INTRAVENOUS | Status: AC
  Filled 2019-02-09: qty 100

## 2019-02-09 MED ORDER — PROMETHAZINE HCL 25 MG/ML IJ SOLN: 6.2500 mg | INTRAMUSCULAR | Status: DC | PRN

## 2019-02-09 MED ORDER — LACTATED RINGERS IV SOLN
INTRAVENOUS | Status: DC
  Administered 2019-02-09: 11:00:00 via INTRAVENOUS

## 2019-02-09 MED ORDER — DEXAMETHASONE SODIUM PHOSPHATE 10 MG/ML IJ SOLN
INTRAMUSCULAR | Status: AC
  Filled 2019-02-09: qty 1

## 2019-02-09 MED ORDER — FENTANYL CITRATE (PF) 250 MCG/5ML IJ SOLN
INTRAMUSCULAR | Status: AC
  Filled 2019-02-09: qty 5

## 2019-02-09 MED ORDER — DEXMEDETOMIDINE HCL IN NACL 80 MCG/20ML IV SOLN
INTRAVENOUS | Status: AC
  Filled 2019-02-09: qty 20

## 2019-02-09 MED ORDER — MIDAZOLAM HCL 2 MG/2ML IJ SOLN
INTRAMUSCULAR | Status: AC
  Filled 2019-02-09: qty 2

## 2019-02-09 MED ORDER — FLUCONAZOLE IN SODIUM CHLORIDE 400-0.9 MG/200ML-% IV SOLN: INTRAVENOUS | Status: DC | PRN

## 2019-02-09 MED ORDER — ACETAMINOPHEN 10 MG/ML IV SOLN
INTRAVENOUS | Status: DC | PRN
  Administered 2019-02-09: 1000 mg via INTRAVENOUS

## 2019-02-09 MED ORDER — 0.9 % SODIUM CHLORIDE (POUR BTL) OPTIME
TOPICAL | Status: DC | PRN
  Administered 2019-02-09 (×4): 1000 mL

## 2019-02-09 MED ORDER — HYDROMORPHONE HCL 1 MG/ML IJ SOLN
1.0000 mg | INTRAMUSCULAR | Status: DC | PRN
  Administered 2019-02-09 (×2): 1 mg via INTRAVENOUS
  Filled 2019-02-09 (×2): qty 1

## 2019-02-09 MED ORDER — SCOPOLAMINE 1 MG/3DAYS TD PT72
MEDICATED_PATCH | TRANSDERMAL | Status: DC | PRN
  Administered 2019-02-09: 1 via TRANSDERMAL

## 2019-02-09 MED ORDER — SODIUM CHLORIDE 0.45 % IV SOLN
INTRAVENOUS | Status: DC
  Administered 2019-02-09: 16:00:00 via INTRAVENOUS
  Filled 2019-02-09 (×4): qty 1000

## 2019-02-09 MED ORDER — LIDOCAINE 2% (20 MG/ML) 5 ML SYRINGE
INTRAMUSCULAR | Status: DC | PRN
  Administered 2019-02-09: 100 mg via INTRAVENOUS

## 2019-02-09 MED ORDER — METHOCARBAMOL 1000 MG/10ML IJ SOLN
500.0000 mg | Freq: Three times a day (TID) | INTRAVENOUS | Status: DC | PRN
  Filled 2019-02-09 (×2): qty 5

## 2019-02-09 MED ORDER — OXYCODONE HCL 5 MG PO TABS: 5.0000 mg | ORAL_TABLET | Freq: Once | ORAL | Status: DC | PRN

## 2019-02-09 MED ORDER — SODIUM CHLORIDE 0.9 % IV SOLN
INTRAVENOUS | Status: DC | PRN
  Administered 2019-02-09: 25 ug/min via INTRAVENOUS

## 2019-02-09 MED ORDER — NALOXONE HCL 0.4 MG/ML IJ SOLN: 0.4000 mg | INTRAMUSCULAR | Status: DC | PRN

## 2019-02-09 MED ORDER — DIPHENHYDRAMINE HCL 12.5 MG/5ML PO ELIX: 12.5000 mg | ORAL_SOLUTION | Freq: Four times a day (QID) | ORAL | Status: DC | PRN

## 2019-02-09 MED ORDER — HYDROMORPHONE HCL 1 MG/ML IJ SOLN
INTRAMUSCULAR | Status: AC
  Filled 2019-02-09: qty 1

## 2019-02-09 MED ORDER — ROCURONIUM BROMIDE 10 MG/ML (PF) SYRINGE
PREFILLED_SYRINGE | INTRAVENOUS | Status: AC
  Filled 2019-02-09: qty 10

## 2019-02-09 MED ORDER — DEXAMETHASONE SODIUM PHOSPHATE 10 MG/ML IJ SOLN
INTRAMUSCULAR | Status: DC | PRN
  Administered 2019-02-09: 4 mg via INTRAVENOUS

## 2019-02-09 MED ORDER — PROPOFOL 10 MG/ML IV BOLUS
INTRAVENOUS | Status: AC
  Filled 2019-02-09: qty 20

## 2019-02-09 MED ORDER — SUGAMMADEX SODIUM 200 MG/2ML IV SOLN
INTRAVENOUS | Status: DC | PRN
  Administered 2019-02-09: 200 mg via INTRAVENOUS

## 2019-02-09 MED ORDER — HYDROMORPHONE HCL 1 MG/ML IJ SOLN
0.2500 mg | INTRAMUSCULAR | Status: DC | PRN
  Administered 2019-02-09 (×2): 0.5 mg via INTRAVENOUS

## 2019-02-09 MED ORDER — PROPOFOL 10 MG/ML IV BOLUS
INTRAVENOUS | Status: DC | PRN
  Administered 2019-02-09: 150 mg via INTRAVENOUS

## 2019-02-09 MED ORDER — ONDANSETRON HCL 4 MG/2ML IJ SOLN: 4.0000 mg | Freq: Four times a day (QID) | INTRAMUSCULAR | Status: DC | PRN

## 2019-02-09 MED ORDER — OXYCODONE HCL 5 MG/5ML PO SOLN: 5.0000 mg | Freq: Once | ORAL | Status: DC | PRN

## 2019-02-09 MED ORDER — LIDOCAINE 2% (20 MG/ML) 5 ML SYRINGE
INTRAMUSCULAR | Status: AC
  Filled 2019-02-09: qty 5

## 2019-02-09 MED ORDER — ROCURONIUM BROMIDE 10 MG/ML (PF) SYRINGE
PREFILLED_SYRINGE | INTRAVENOUS | Status: DC | PRN
  Administered 2019-02-09: 20 mg via INTRAVENOUS
  Administered 2019-02-09: 50 mg via INTRAVENOUS

## 2019-02-09 MED ORDER — PROPOFOL 500 MG/50ML IV EMUL
INTRAVENOUS | Status: DC | PRN
  Administered 2019-02-09: 20 ug/kg/min via INTRAVENOUS

## 2019-02-09 MED ORDER — MEPERIDINE HCL 25 MG/ML IJ SOLN: 6.2500 mg | INTRAMUSCULAR | Status: DC | PRN

## 2019-02-09 MED ORDER — KETAMINE HCL 10 MG/ML IJ SOLN
INTRAMUSCULAR | Status: DC | PRN
  Administered 2019-02-09: 10 mg via INTRAVENOUS
  Administered 2019-02-09: 20 mg via INTRAVENOUS

## 2019-02-09 SURGICAL SUPPLY — 49 items
APL PRP STRL LF DISP 70% ISPRP (MISCELLANEOUS) ×1
CANISTER SUCT 3000ML PPV (MISCELLANEOUS) ×2 IMPLANT
CANISTER WOUNDNEG PRESSURE 500 (CANNISTER) ×1 IMPLANT
CHLORAPREP W/TINT 26 (MISCELLANEOUS) ×2 IMPLANT
COVER SURGICAL LIGHT HANDLE (MISCELLANEOUS) ×2 IMPLANT
DRAIN CHANNEL 19F RND (DRAIN) ×1 IMPLANT
DRAPE LAPAROSCOPIC ABDOMINAL (DRAPES) ×2 IMPLANT
DRAPE WARM FLUID 44X44 (DRAPES) ×2 IMPLANT
DRSG VAC ATS MED SENSATRAC (GAUZE/BANDAGES/DRESSINGS) ×1 IMPLANT
ELECT BLADE 6.5 EXT (BLADE) ×1 IMPLANT
ELECT CAUTERY BLADE 6.4 (BLADE) ×2 IMPLANT
ELECT REM PT RETURN 9FT ADLT (ELECTROSURGICAL) ×2
ELECTRODE REM PT RTRN 9FT ADLT (ELECTROSURGICAL) ×1 IMPLANT
EVACUATOR SILICONE 100CC (DRAIN) ×1 IMPLANT
GLOVE BIO SURGEON STRL SZ 6 (GLOVE) ×2 IMPLANT
GLOVE BIO SURGEON STRL SZ 6.5 (GLOVE) ×1 IMPLANT
GLOVE BIOGEL PI IND STRL 6.5 (GLOVE) IMPLANT
GLOVE BIOGEL PI IND STRL 7.0 (GLOVE) IMPLANT
GLOVE BIOGEL PI INDICATOR 6.5 (GLOVE) ×1
GLOVE BIOGEL PI INDICATOR 7.0 (GLOVE) ×1
GLOVE INDICATOR 6.5 STRL GRN (GLOVE) ×2 IMPLANT
GLOVE SURG SS PI 6.5 STRL IVOR (GLOVE) ×1 IMPLANT
GOWN STRL REUS W/ TWL LRG LVL3 (GOWN DISPOSABLE) ×2 IMPLANT
GOWN STRL REUS W/TWL LRG LVL3 (GOWN DISPOSABLE) ×8
HANDLE SUCTION POOLE (INSTRUMENTS) ×1 IMPLANT
KIT BASIN OR (CUSTOM PROCEDURE TRAY) ×2 IMPLANT
KIT OSTOMY DRAINABLE 2.75 STR (WOUND CARE) ×1 IMPLANT
KIT TURNOVER KIT B (KITS) ×2 IMPLANT
LIGASURE IMPACT 36 18CM CVD LR (INSTRUMENTS) ×1 IMPLANT
NS IRRIG 1000ML POUR BTL (IV SOLUTION) ×4 IMPLANT
PACK GENERAL/GYN (CUSTOM PROCEDURE TRAY) ×2 IMPLANT
PAD ARMBOARD 7.5X6 YLW CONV (MISCELLANEOUS) ×2 IMPLANT
PENCIL SMOKE EVACUATOR (MISCELLANEOUS) ×2 IMPLANT
SLEEVE SUCTION CATH 165 (SLEEVE) ×1 IMPLANT
SPONGE LAP 18X18 RF (DISPOSABLE) ×1 IMPLANT
STAPLER CUT CVD 40MM GREEN (STAPLE) ×1 IMPLANT
STAPLER CUT RELOAD GREEN (STAPLE) ×1 IMPLANT
STAPLER VISISTAT 35W (STAPLE) ×2 IMPLANT
SUCTION POOLE HANDLE (INSTRUMENTS) ×2
SUT ETHILON 2 0 FS 18 (SUTURE) ×1 IMPLANT
SUT PDS AB 1 TP1 96 (SUTURE) ×4 IMPLANT
SUT PROLENE 2 0 CT2 30 (SUTURE) ×1 IMPLANT
SUT SILK 2 0 SH CR/8 (SUTURE) ×2 IMPLANT
SUT SILK 2 0 TIES 10X30 (SUTURE) ×2 IMPLANT
SUT SILK 3 0 SH CR/8 (SUTURE) ×2 IMPLANT
SUT SILK 3 0 TIES 10X30 (SUTURE) ×2 IMPLANT
SUT VIC AB 3-0 SH 18 (SUTURE) ×1 IMPLANT
TOWEL GREEN STERILE (TOWEL DISPOSABLE) ×2 IMPLANT
TRAY FOLEY MTR SLVR 16FR STAT (SET/KITS/TRAYS/PACK) ×1 IMPLANT

## 2019-02-09 NOTE — Progress Notes (Signed)
[facility]/Trauma Progress Note  Day of Surgery   Assessment/Plan Sigmoid diverticulitis with3.8 cmabscess, admission 06/27 -3-4prior episodes of diverticulitis, none that required hospitalization  - Last colonoscopy 2013 by Dr. Hilarie Fredrickson showed mild diverticulosis in the left colon -S/Pdrain placement06/77for the abscess, 07/04 s/p redrainage due to drain migration - Persistent pain and leukocytosis despite maximal medical tx. OR today.   ID -cipro/flagyl 6/26-06/30; Zosyn 06/30>>WBC 17 VTE -SCDs, sq heparin FEN - NPO for OR Foley -none Follow up -TBD  Plan: OR today for open sigmoid colectomy with end colostomy. We have extensively discussed the surgery, risks, benefits, and alternatives. She is ready to proceed.    LOS: 13 days    Subjective: CC: mild abdominal pain  Feels ok this morning. Denies pain. Ready for surgery.  Objective: Vital signs in last 24 hours: Temp:  [98.2 F (36.8 C)-99.9 F (37.7 C)] 98.9 F (37.2 C) (07/10 0422) Pulse Rate:  [72-78] 78 (07/10 0931) Resp:  [16-19] 16 (07/10 0422) BP: (144-151)/(66-76) 151/76 (07/10 0931) SpO2:  [96 %-97 %] 97 % (07/10 0422) Weight:  [88 kg-89.9 kg] 89.9 kg (07/10 1021) Last BM Date: 02/08/19  Intake/Output from previous day: 07/09 0701 - 07/10 0700 In: 755 [P.O.:480; I.V.:15; IV Piggyback:250] Out: 61 [Urine:2; Drains:57; Stool:2] Intake/Output this shift: No intake/output data recorded.  PE: Gen: Alert, NAD, pleasant, cooperative Pulm:Rate andeffort normal Abd: Soft,no distention, +BS,TTP in LLQ and suprapubic region withguarding, no peritonitis, drain in LLQ with serosanguinous drainage, posterior drain with cloudy serous drainage, purulent drainage from site of LLQ drain with stable TTP Skin: no rashes noted, warm and dry Neuro: alert and oriented.   Anti-infectives: Anti-infectives (From admission, onward)   Start     Dose/Rate Route Frequency Ordered Stop    02/02/19 1415  [MAR Hold]  fluconazole (DIFLUCAN) IVPB 400 mg     (MAR Hold since Fri 02/09/2019 at 1007.Hold Reason: Transfer to a Procedural area.)   400 mg 100 mL/hr over 120 Minutes Intravenous Every 24 hours 02/02/19 1408     01/30/19 1200  [MAR Hold]  piperacillin-tazobactam (ZOSYN) IVPB 3.375 g     (MAR Hold since Fri 02/09/2019 at 1007.Hold Reason: Transfer to a Procedural area.)   3.375 g 12.5 mL/hr over 240 Minutes Intravenous Every 8 hours 01/30/19 1045     01/27/19 1015  ciprofloxacin (CIPRO) IVPB 400 mg  Status:  Discontinued     400 mg 200 mL/hr over 60 Minutes Intravenous Every 12 hours 01/27/19 0117 01/30/19 1039   01/27/19 0700  metroNIDAZOLE (FLAGYL) IVPB 500 mg  Status:  Discontinued     500 mg 100 mL/hr over 60 Minutes Intravenous Every 8 hours 01/27/19 0117 01/30/19 1039   01/26/19 2245  ciprofloxacin (CIPRO) IVPB 400 mg     400 mg 200 mL/hr over 60 Minutes Intravenous  Once 01/26/19 2232 01/27/19 0026   01/26/19 2245  metroNIDAZOLE (FLAGYL) IVPB 500 mg     500 mg 100 mL/hr over 60 Minutes Intravenous  Once 01/26/19 2232 01/27/19 0010      Lab Results:  Recent Labs    02/08/19 0339 02/09/19 0503  WBC 19.4* 17.2*  HGB 12.6 12.1  HCT 37.9 37.2  PLT 358 329   BMET Recent Labs    02/08/19 0339 02/09/19 0503  NA 132* 135  K 2.8* 3.5  CL 96* 100  CO2 26 24  GLUCOSE 139* 107*  BUN <5* 6  CREATININE 0.60 0.57  CALCIUM 8.5* 8.4*   PT/INR No results for  input(s): LABPROT, INR in the last 72 hours. CMP     Component Value Date/Time   NA 135 02/09/2019 0503   NA 140 01/25/2019 0924   K 3.5 02/09/2019 0503   CL 100 02/09/2019 0503   CO2 24 02/09/2019 0503   GLUCOSE 107 (H) 02/09/2019 0503   BUN 6 02/09/2019 0503   BUN 8 01/25/2019 0924   CREATININE 0.57 02/09/2019 0503   CALCIUM 8.4 (L) 02/09/2019 0503   PROT 5.2 (L) 01/30/2019 0224   PROT 6.8 01/25/2019 0924   ALBUMIN 2.1 (L) 01/30/2019 0224   ALBUMIN 4.0 01/25/2019 0924   AST 9 (L) 01/30/2019  0224   ALT 8 01/30/2019 0224   ALKPHOS 66 01/30/2019 0224   BILITOT 0.6 01/30/2019 0224   BILITOT 0.4 01/25/2019 0924   GFRNONAA >60 02/09/2019 0503   GFRAA >60 02/09/2019 0503   Lipase     Component Value Date/Time   LIPASE 26 01/26/2019 2032    Studies/Results: Ct Abdomen Pelvis W Contrast  Result Date: 02/08/2019 CLINICAL DATA:  Left lower quadrant pain, drain site, loose stools EXAM: CT ABDOMEN AND PELVIS WITH CONTRAST TECHNIQUE: Multidetector CT imaging of the abdomen and pelvis was performed using the standard protocol following bolus administration of intravenous contrast. CONTRAST:  174mL OMNIPAQUE IOHEXOL 300 MG/ML SOLN, additional oral enteric contrast COMPARISON:  02/02/2019, 02/03/2019 FINDINGS: Lower chest: Small right pleural effusion, decreased compared to prior examinations. Coronary artery calcifications and/or stents. Hepatobiliary: No solid liver abnormality is seen. No gallstones, gallbladder wall thickening, or biliary dilatation. Pancreas: Unremarkable. No pancreatic ductal dilatation or surrounding inflammatory changes. Spleen: Normal in size without significant abnormality. Adrenals/Urinary Tract: Adrenal glands are unremarkable. Kidneys are normal, without renal calculi, solid lesion, or hydronephrosis. Bladder is unremarkable. Stomach/Bowel: Stomach is within normal limits. There is redemonstrated extensive thickening and fat stranding about the sigmoid colon and adjacent mid small bowel loops. There has been interval repositioning of a left lower quadrant approach pigtail drainage catheter, which now appears to be positioned within an air and fluid collection anterior to the mid sigmoid (series 3, image 71). This collection is not significantly changed in size, measuring approximately 4.5 cm. There are small locules of air about the catheter tract and within the left rectus musculature (series 3, image 69). Vascular/Lymphatic: Aortic atherosclerosis. No enlarged abdominal  or pelvic lymph nodes. Reproductive: Uterine fibroids. Other: Small fat containing umbilical hernia. There has been interval placement of a left gluteal approach pigtail drainage catheter in a loculated appearing fluid collection about the superior aspect of the uterus and urinary bladder, which is substantially decreased in size and nearly resolved (series 3, image 75). Musculoskeletal: No acute or significant osseous findings. IMPRESSION: 1. There is redemonstrated extensive thickening and fat stranding about the sigmoid colon and adjacent mid small bowel loops. Findings are consistent with acute diverticulitis complicated by perforation and abscess. 2. There has been interval repositioning of a left lower quadrant approach pigtail drainage catheter, which now appears to be positioned within an air and fluid collection anterior to the mid sigmoid (series 3, image 71). This collection is not significantly changed in size, measuring approximately 4.5 cm. There are small locules of air about the catheter tract and within the left rectus musculature (series 3, image 69) without a discrete fluid collection appreciated. 3. There has been interval placement of a left gluteal approach pigtail drainage catheter in a loculated appearing fluid collection about the superior aspect of the uterus and urinary bladder, which is substantially decreased in  size and nearly resolved (series 3, image 75). 4.  Other chronic and incidental findings as detailed above. Electronically Signed   By: Eddie Candle M.D.   On: 02/08/2019 12:49      Clovis Riley , [facility] 02/09/2019, 10:28 AM

## 2019-02-09 NOTE — Progress Notes (Signed)
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 sae pcp 1 day PTA  For ongoing diarrhea and abdominal pain X5 DAYS-and was diagnosed clinically as diverticulitis and started on Augmentin and Flagyl. Patient says that she did not feel better and in fact her bloating got worse with significant worsening of abdominal pain.   In the ER, CT scan of the abdomen pelvis showed acute complicated sigmoid diverticulitis- 3.8 cm abscess adjacent to sigmoid colon. Small to moderate volume of fluid within the pelvis with questionable rim enhancement.  General surgery  CCS was consulted advised IV antibiotics and IR drainage of the abscess and was admitted.  Patient had low-grade fever and nonbloody diarrhea.   Subjective: Patient seen and examined at bedside, resting comfortably.  White count continues to be elevated despite long course of IV antibiotics and drain placement.  Repeat CT abdomen/pelvis yesterday notable for acute diverticulitis complicated by perforation/abscess.  General surgery plans operative management today with open sigmoid colectomy with end colostomy.  No other complaints at this time.  Denies headache, no fever/chills/night sweats, no nausea/vomiting/diarrhea, no chest pain, no palpitations, no shortness of breath, no abdominal discomfort, no cough/congestion, no issues with bowel/bladder function, no paresthesias.  No acute events overnight per nursing staff.  Assessment & Plan:  Acute sigmoid diverticulitis with abscess:  Pt with 4-5 prior episodes of diverticulitis that did not require hospitalization.  Colonoscopy abt 5 yrs diverticulosis. S/P dain placement 6/29 for abscess. WBC was trending up, repeat CT 7/3 persistent acute  left lower quadrant sigmoid diverticulitis with 2 pelvic abscesses, left lower quadrant abscess drain was retracted into the pericolonic fat.  S/p replacement of  LLQ drain and new pelvic drain 7/4.  Drain having serosanguineous output.   --General surgery following, appreciate assistance --Patient's abdominal pain improved, WBC count trending up 13.1-->14.1-->19.4-->17.2 --Left abdominal drain with 35 mL's past 24 hours, left buttock drain 22 mL's past 24 hours --Continues on Zosyn for empiric antimicrobial coverage --CT abdomen/pelvis 02/08/2019 with thickening/fat stranding sigmoid colon consistent with acute diverticulitis complicated by perforation/abscess. --General surgery plans operative management today with open sigmoid colectomy with end colostomy.   Chronic diastolic CHF:  Compensated. Pt retaining fluid but no shortness of breath.  --Continue HCTZ.    Swelling of the right upper extremity/and right lower leg.   Swelling is improving-cont HCTZ.  U/S duplex negative for DVT.     Anxiety/depression  Continue home Xanax, buspirone, Cymbalta and trazodone.    Essential hypertension: BP fairly controlled on Metoprolol, HCTZ losartan  CAD with prior stent: Cont asa.  Prediabetes: A1c 5.8.  We will advise her to follow-up with PCP, for now continue on diet control.  HLD:  on statins  OSA: On Nocturnal CPAP but patient has been refusing.    Hypokalemia:  Potassium 3.5 this morning. Mag 1.5 Repleted. --Repeat electrolytes in the a.m. to include magnesium  DVT prophylaxis:   Lovenox  Code Status: Full Code Family Communication:  none Disposition Plan: Remains inpatient pending clinical improvement.  OR today for sigmoid colectomy with end colostomy.   Consultants:  CCS  Procedures:  DRAIN PLACEMENT FOR ABSCESS Left Abd Drain replacement and new drain ln left gluteal 7/4  Antimicrobials: Anti-infectives (From admission, onward)  Start     Dose/Rate Route Frequency  Ordered Stop   02/02/19 1415  [MAR Hold]  fluconazole (DIFLUCAN) IVPB 400 mg     (MAR Hold since Fri 02/09/2019 at 1007.Hold Reason: Transfer to a Procedural area.)   400 mg 100 mL/hr over 120 Minutes Intravenous Every 24 hours 02/02/19 1408     01/30/19 1200  [MAR Hold]  piperacillin-tazobactam (ZOSYN) IVPB 3.375 g     (MAR Hold since Fri 02/09/2019 at 1007.Hold Reason: Transfer to a Procedural area.)   3.375 g 12.5 mL/hr over 240 Minutes Intravenous Every 8 hours 01/30/19 1045     01/27/19 1015  ciprofloxacin (CIPRO) IVPB 400 mg  Status:  Discontinued     400 mg 200 mL/hr over 60 Minutes Intravenous Every 12 hours 01/27/19 0117 01/30/19 1039   01/27/19 0700  metroNIDAZOLE (FLAGYL) IVPB 500 mg  Status:  Discontinued     500 mg 100 mL/hr over 60 Minutes Intravenous Every 8 hours 01/27/19 0117 01/30/19 1039   01/26/19 2245  ciprofloxacin (CIPRO) IVPB 400 mg     400 mg 200 mL/hr over 60 Minutes Intravenous  Once 01/26/19 2232 01/27/19 0026   01/26/19 2245  metroNIDAZOLE (FLAGYL) IVPB 500 mg     500 mg 100 mL/hr over 60 Minutes Intravenous  Once 01/26/19 2232 01/27/19 0010       Objective: Vitals:   02/09/19 0641 02/09/19 0930 02/09/19 0931 02/09/19 1021  BP:  (!) 151/76 (!) 151/76   Pulse:  77 78   Resp:      Temp:      TempSrc:      SpO2:      Weight: 89.9 kg   89.9 kg  Height:    5' 2.99" (1.6 m)    Intake/Output Summary (Last 24 hours) at 02/09/2019 1052 Last data filed at 02/09/2019 0801 Gross per 24 hour  Intake 555 ml  Output 59 ml  Net 496 ml   Filed Weights   02/09/19 0500 02/09/19 0641 02/09/19 1021  Weight: 88 kg 89.9 kg 89.9 kg   Weight change: -3.8 kg  Body mass index is 35.12 kg/m.  Intake/Output from previous day: 07/09 0701 - 07/10 0700 In: 755 [P.O.:480; I.V.:15; IV Piggyback:250] Out: 61 [Urine:2; Drains:57; Stool:2] Intake/Output this shift: No intake/output data recorded.  Examination:  General exam: Calm, comfortable, not in acute distress,  anxious since hearing abt surgery\ HEENT:Oral mucosa moist, Ear/Nose WNL grossly, dentition normal. Respiratory system: Bilateral equal air entry, no crackles and wheezing, no use of accessory muscle, non tender on palpation. Cardiovascular system: regular rate and rhythm, S1 & S2 heard, No JVD/murmurs. Gastrointestinal system: Abdomen soft, mild tenderness in left lower quadrant, obese and bowel sounds present. Drains present left lower quadrant and left gluteal area  Nervous System:Alert, awake and oriented at baseline. Non focal Extremities: No edema, distal peripheral pulses palpable.  Skin: No rashes,no icterus. MSK: Normal muscle bulk,tone, power  Medications:  Scheduled Meds:  [MAR Hold] aspirin EC  81 mg Oral Daily   [MAR Hold] busPIRone  15 mg Oral TID   [MAR Hold] docusate sodium  100 mg Oral Daily   [MAR Hold] DULoxetine  60 mg Oral Daily   [MAR Hold] enoxaparin (LOVENOX) injection  40 mg Subcutaneous Q24H   [MAR Hold] feeding supplement  1 Container Oral TID BM   [MAR Hold] guaiFENesin  600 mg Oral BID   [MAR Hold] losartan  100 mg Oral Daily   And   [MAR Hold] hydrochlorothiazide  25 mg Oral Daily   [MAR Hold] metoprolol tartrate  25 mg Oral BID   [MAR Hold] potassium chloride  40 mEq Oral BID   [MAR Hold] rosuvastatin  10 mg Oral Daily   [MAR Hold] sodium chloride flush  10-40 mL Intracatheter Q12H   [MAR Hold] sodium chloride flush  5 mL Intracatheter Q8H   [MAR Hold] sodium chloride flush  5 mL Intracatheter Q8H   Continuous Infusions:  [MAR Hold] fluconazole (DIFLUCAN) IV 400 mg (02/08/19 1031)   lactated ringers 10 mL/hr at 02/09/19 1032   [MAR Hold] magnesium sulfate bolus IVPB     [MAR Hold] piperacillin-tazobactam (ZOSYN)  IV 3.375 g (02/09/19 0500)   [MAR Hold] potassium chloride      Data Reviewed: I have personally reviewed following labs and imaging studies  CBC: Recent Labs  Lab 02/05/19 0335 02/06/19 0220 02/07/19 0346  02/08/19 0339 02/09/19 0503  WBC 12.4* 13.1* 14.1* 19.4* 17.2*  HGB 11.3* 11.5* 12.6 12.6 12.1  HCT 34.4* 35.1* 38.1 37.9 37.2  MCV 84.5 85.2 83.9 83.8 84.5  PLT 295 317 366 358 939   Basic Metabolic Panel: Recent Labs  Lab 02/03/19 0117  02/05/19 0335 02/06/19 0220 02/07/19 0346 02/08/19 0339 02/09/19 0503  NA 131*   < > 135 136 136 132* 135  K 3.1*   < > 3.5 4.4 4.6 2.8* 3.5  CL 94*   < > 96* 97* 98 96* 100  CO2 27   < > 29 30 26 26 24   GLUCOSE 83   < > 83 82 92 139* 107*  BUN <5*   < > <5* <5* <5* <5* 6  CREATININE 0.50   < > 0.58 0.59 0.62 0.60 0.57  CALCIUM 7.9*   < > 8.3* 8.8* 8.9 8.5* 8.4*  MG 1.7  --   --   --   --   --  1.5*   < > = values in this interval not displayed.   GFR: Estimated Creatinine Clearance: 81.6 mL/min (by C-G formula based on SCr of 0.57 mg/dL). Liver Function Tests: No results for input(s): AST, ALT, ALKPHOS, BILITOT, PROT, ALBUMIN in the last 168 hours. No results for input(s): LIPASE, AMYLASE in the last 168 hours. No results for input(s): AMMONIA in the last 168 hours. Coagulation Profile: Recent Labs  Lab 02/03/19 0117  INR 1.2   Cardiac Enzymes: No results for input(s): CKTOTAL, CKMB, CKMBINDEX, TROPONINI in the last 168 hours. BNP (last 3 results) No results for input(s): PROBNP in the last 8760 hours. HbA1C: No results for input(s): HGBA1C in the last 72 hours. CBG: No results for input(s): GLUCAP in the last 168 hours. Lipid Profile: No results for input(s): CHOL, HDL, LDLCALC, TRIG, CHOLHDL, LDLDIRECT in the last 72 hours. Thyroid Function Tests: No results for input(s): TSH, T4TOTAL, FREET4, T3FREE, THYROIDAB in the last 72 hours. Anemia Panel: No results for input(s): VITAMINB12, FOLATE, FERRITIN, TIBC, IRON, RETICCTPCT in the last 72 hours. Sepsis Labs: No results for input(s): PROCALCITON, LATICACIDVEN in the last 168 hours.  Recent Results (from the past 240 hour(s))  Aerobic/Anaerobic Culture (surgical/deep wound)      Status: None   Collection Time: 02/03/19 10:21 AM   Specimen: Pelvis; Abscess  Result Value Ref Range Status   Specimen Description PELVIS  Final   Special Requests Immunocompromised  Final   Gram Stain   Final    ABUNDANT WBC PRESENT, PREDOMINANTLY PMN RARE GRAM POSITIVE COCCI IN PAIRS IN CHAINS  Culture   Final    No growth aerobically or anaerobically. Performed at [facility], [place] [address], [place], [place] [address]    Report Status 02/08/2019 FINAL  Final  Aerobic/Anaerobic Culture (surgical/deep wound)     Status: Abnormal   Collection Time: 02/03/19 10:22 AM   Specimen: Abscess  Result Value Ref Range Status   Specimen Description ABSCESS  Final   Special Requests Immunocompromised  Final   Gram Stain   Final    ABUNDANT WBC PRESENT, PREDOMINANTLY PMN ABUNDANT GRAM NEGATIVE RODS FEW GRAM POSITIVE COCCI IN CLUSTERS    Culture (A)  Final    MULTIPLE ORGANISMS PRESENT, NONE PREDOMINANT NO ANAEROBES ISOLATED Performed at [facility], [address], [place], [place] [address]    Report Status 02/08/2019 FINAL  Final  Surgical pcr screen     Status: None   Collection Time: 02/07/19  6:43 AM   Specimen: Nasal Mucosa; Nasal Swab  Result Value Ref Range Status   MRSA, PCR NEGATIVE NEGATIVE Final   Staphylococcus aureus NEGATIVE NEGATIVE Final    Comment: (NOTE) The Xpert SA Assay (FDA approved for NASAL specimens in patients 43 years of age and older), is one component of a comprehensive surveillance program. It is not intended to diagnose infection nor to guide or monitor treatment. Performed at [facility], [place] [address] [place],  [address]       Radiology Studies: Ct Abdomen Pelvis W Contrast  Result Date: 02/08/2019 CLINICAL DATA:  Left lower quadrant pain, drain site, loose stools EXAM: CT ABDOMEN AND PELVIS WITH CONTRAST TECHNIQUE: Multidetector CT imaging of the abdomen and pelvis was performed using the standard protocol  following bolus administration of intravenous contrast. CONTRAST:  152mL OMNIPAQUE IOHEXOL 300 MG/ML SOLN, additional oral enteric contrast COMPARISON:  02/02/2019, 02/03/2019 FINDINGS: Lower chest: Small right pleural effusion, decreased compared to prior examinations. Coronary artery calcifications and/or stents. Hepatobiliary: No solid liver abnormality is seen. No gallstones, gallbladder wall thickening, or biliary dilatation. Pancreas: Unremarkable. No pancreatic ductal dilatation or surrounding inflammatory changes. Spleen: Normal in size without significant abnormality. Adrenals/Urinary Tract: Adrenal glands are unremarkable. Kidneys are normal, without renal calculi, solid lesion, or hydronephrosis. Bladder is unremarkable. Stomach/Bowel: Stomach is within normal limits. There is redemonstrated extensive thickening and fat stranding about the sigmoid colon and adjacent mid small bowel loops. There has been interval repositioning of a left lower quadrant approach pigtail drainage catheter, which now appears to be positioned within an air and fluid collection anterior to the mid sigmoid (series 3, image 71). This collection is not significantly changed in size, measuring approximately 4.5 cm. There are small locules of air about the catheter tract and within the left rectus musculature (series 3, image 69). Vascular/Lymphatic: Aortic atherosclerosis. No enlarged abdominal or pelvic lymph nodes. Reproductive: Uterine fibroids. Other: Small fat containing umbilical hernia. There has been interval placement of a left gluteal approach pigtail drainage catheter in a loculated appearing fluid collection about the superior aspect of the uterus and urinary bladder, which is substantially decreased in size and nearly resolved (series 3, image 75). Musculoskeletal: No acute or significant osseous findings. IMPRESSION: 1. There is redemonstrated extensive thickening and fat stranding about the sigmoid colon and adjacent  mid small bowel loops. Findings are consistent with acute diverticulitis complicated by perforation and abscess. 2. There has been interval repositioning of a left lower quadrant approach pigtail drainage catheter, which now appears to be positioned within an air and fluid  collection anterior to the mid sigmoid (series 3, image 71). This collection is not significantly changed in size, measuring approximately 4.5 cm. There are small locules of air about the catheter tract and within the left rectus musculature (series 3, image 69) without a discrete fluid collection appreciated. 3. There has been interval placement of a left gluteal approach pigtail drainage catheter in a loculated appearing fluid collection about the superior aspect of the uterus and urinary bladder, which is substantially decreased in size and nearly resolved (series 3, image 75). 4.  Other chronic and incidental findings as detailed above. Electronically Signed   By: Eddie Candle M.D.   On: 02/08/2019 12:49      LOS: 13 days   Time spent: 32 minutes. More than 50% of that time was spent in counseling and/or coordination of care.  Melisia Leming J [place] DO [facility]  02/09/2019, 10:52 AM

## 2019-02-09 NOTE — Anesthesia Procedure Notes (Signed)
Procedure Name: Intubation Performed by: Milford Cage, CRNA Pre-anesthesia Checklist: Patient identified, Emergency Drugs available, Suction available and Patient being monitored Patient Re-evaluated:Patient Re-evaluated prior to induction Oxygen Delivery Method: Circle System Utilized Preoxygenation: Pre-oxygenation with 100% oxygen Induction Type: IV induction and Rapid sequence Laryngoscope Size: Mac and 3 Grade View: Grade I Tube type: Oral Tube size: 7.0 mm Number of attempts: 1 Airway Equipment and Method: Stylet and Oral airway Placement Confirmation: ETT inserted through vocal cords under direct vision,  positive ETCO2 and breath sounds checked- equal and bilateral Secured at: 21 cm Tube secured with: Tape Dental Injury: Teeth and Oropharynx as per pre-operative assessment

## 2019-02-09 NOTE — Op Note (Signed)
Operative Note  LARAH KUNTZMAN  [id number]  [id number]  02/09/2019   Surgeon: Victorino Sparrow ConnorMD  Assistant: Georganna Skeans MD, Jackson Latino PA-C  Procedure performed: Exploratory laparotomy, lysis of inflammatory adhesions, sigmoid colectomy, end colostomy; placement of incisional wound VAC  Procedure classification: Urgent  Infection present at the time of surgery: yes, purulent peritonitis  Preop diagnosis: Perforated sigmoid diverticulitis with intra-abdominal and interloop abscess formation, ongoing refractory inflammation despite 2 weeks of antibiotics and multiple drains Post-op diagnosis/intraop findings: Same  Specimens: Sigmoid colon Retained items: 62 French round Blake drain in the pelvis EBL: 94BS Complications: none  Description of procedure: After obtaining informed consent the patient was taken to the operating room and placed supine on operating room table wheregeneral endotracheal anesthesia was initiated, preoperative antibiotics were administered, SCDs applied, and a formal timeout was performed.  Foley catheter was inserted.  The abdomen was then prepped and draped in usual sterile fashion.  A midline incision was made and cautery used to dissect through the soft tissues until linea alba was identified and divided.  The peritoneal cavity was carefully entered and the incision completed.  While creating the incision, she was noted to have inflammatory adhesions between the small bowel and the anterior abdominal wall in the lower abdomen and these were bluntly taken down.  Incarcerated omentum within an umbilical hernia was reduced and dense omental adhesions to the inflamed sigmoid colon were bluntly divided so that the omentum could be lifted and tucked into the upper abdomen.  There were inflammatory adhesions of small bowel to the sigmoid colon and abscess cavity as well as in the pelvis.  These were very gently bluntly fractured in order to mobilize the small  bowel out of the pelvis and away from the sigmoid colon.  While doing this, we entered the 2 abscess cavities containing percutaneous drains previously placed.  Purulent fluid was released and evacuated from the abdomen.  The 2 previously placed pigtail drains were removed.  Once we had the small bowel free, this was closely inspected and run from the ligament of Treitz to the cecal valve.  There were 2 areas of dense inflammatory changes/bowel wall thickening and abscess rind adherent to the mesentery and serosa, however the serosa of the bowel appeared to be intact there was no evidence of bowel injury or obstruction.   At this point I was able to visualize the sigmoid colon.  Again gentle manual blunt dissection was used to free of inflammatory adhesions to the anterior abdominal wall and pelvic wall.  I retracted the sigmoid colon medially and cautery was used to divide the lateral peritoneal reflection and enter the retro-mesenteric plane.  This was followed cephalad and inferiorly to mobilize the sigmoid colon medially.  We did identify the ureter and it was remote from the area of dissection.  Identified a soft area on the rectum, a window was made in the mesentery and a contour green load stapler was used to transect the sigmoid from the rectum.  We then moved proximally to a soft area on the descending colon proximal to the area of inflammation and again a mesenteric window was made and the contour green load was used to transect the colon.  The LigaSure was then used to divide the intervening mesentery, staying adjacent to the colon wall.  The sigmoid colon was then handed off to be sent for pathology.  Hemostasis was ensured.  We did ligate one small bleeding vein in the mesentery with a figure-of-eight  3-0 silk.  Rectal stump was marked with 2-0 Prolene's on either corner of the staple line.  Proceeded to mobilize the colon sufficiently that it would reach the abdominal wall.  We divided some of the  mesentery, preserving the marginal artery but the splenic flexure was not mobilized.   The small bowel was then once again run and confirmed to be free of any injury.  The abdomen was irrigated with 3 L of warm sterile saline.  A colostomy site was prepared in the left upper abdomen and the stapled colon was brought out through this ensuring no twisting or obstruction.  The abdomen was once again inspected for hemostasis.  A 19 French round Blake drain was introduced through the right lower abdomen and placed in the pelvis and left lower quadrant.  This is secured the skin with a 3-0 nylon.  The omentum is brought back down to cover the viscera and the midline incision was closed with running looped #1 PDS starting at either end and tying centrally.  The wound was irrigated and a wound VAC applied.  The colostomy was then matured using interrupted 3-0 Vicryls. The stoma was digitized and confirmed patent through the fascia. An ostomy appliance was then placed.  The drain was placed to bulb suction and the wound VAC was placed to continuous suction.  Dressings were placed on the prior drain sites.  The patient was then awakened, extubated and taken to PACU in stable condition.   All counts were correct at the completion of the case.   I updated her daughter Joellen Jersey by phone at the end of the case.

## 2019-02-09 NOTE — Anesthesia Preprocedure Evaluation (Signed)
Anesthesia Evaluation  Patient identified by MRN, date of birth, ID band Patient awake    Reviewed: Allergy & Precautions, NPO status , Patient's Chart, lab work & pertinent test results  History of Anesthesia Complications (+) PONV and history of anesthetic complications  Airway Mallampati: II  TM Distance: >3 FB Neck ROM: Full    Dental no notable dental hx.    Pulmonary sleep apnea , former smoker,    Pulmonary exam normal breath sounds clear to auscultation       Cardiovascular hypertension, + CAD and + Past MI  Normal cardiovascular exam Rhythm:Regular Rate:Normal     Neuro/Psych PSYCHIATRIC DISORDERS Anxiety Depression  Neuromuscular disease    GI/Hepatic negative GI ROS, Neg liver ROS,   Endo/Other  negative endocrine ROS  Renal/GU negative Renal ROS  negative genitourinary   Musculoskeletal negative musculoskeletal ROS (+)   Abdominal (+) + obese,   Peds negative pediatric ROS (+)  Hematology negative hematology ROS (+)   Anesthesia Other Findings   Reproductive/Obstetrics negative OB ROS                             Anesthesia Physical  Anesthesia Plan  ASA: III  Anesthesia Plan: General   Post-op Pain Management:    Induction: Intravenous, Rapid sequence and Cricoid pressure planned  PONV Risk Score and Plan: 4 or greater and Ondansetron, Dexamethasone, Midazolam, Scopolamine patch - Pre-op and Treatment may vary due to age or medical condition  Airway Management Planned: Oral ETT  Additional Equipment:   Intra-op Plan:   Post-operative Plan:   Informed Consent: I have reviewed the patients History and Physical, chart, labs and discussed the procedure including the risks, benefits and alternatives for the proposed anesthesia with the patient or authorized representative who has indicated his/her understanding and acceptance.     Dental advisory given  Plan  Discussed with: CRNA  Anesthesia Plan Comments:         Anesthesia Quick Evaluation

## 2019-02-09 NOTE — Transfer of Care (Signed)
Immediate Anesthesia Transfer of Care Note  Patient: Heather Lam  Procedure(s) Performed: Jeanette Caprice PROCEDURE (N/A Abdomen) COLOSTOMY (N/A Abdomen) APPLICATION OF WOUND VAC (N/A Abdomen)  Patient Location: PACU  Anesthesia Type:General  Level of Consciousness: drowsy  Airway & Oxygen Therapy: Patient Spontanous Breathing and Patient connected to face mask oxygen  Post-op Assessment: Report given to RN and Post -op Vital signs reviewed and stable  Post vital signs: Reviewed and stable  Last Vitals:  Vitals Value Taken Time  BP 150/89 02/09/19 1351  Temp 36.1 C 02/09/19 1351  Pulse 66 02/09/19 1353  Resp 24 02/09/19 1353  SpO2 100 % 02/09/19 1353  Vitals shown include unvalidated device data.  Last Pain:  Vitals:   02/09/19 1351  TempSrc:   PainSc: Asleep      Patients Stated Pain Goal: 1 (16/83/72 9021)  Complications: No apparent anesthesia complications

## 2019-02-09 NOTE — Consult Note (Addendum)
Lead Nurse requested for preoperative stoma site marking  Discussed surgical procedure and stoma creation with patient.  Explained role of the [place] nurse team.  Provided the patient with educational booklet and provided samples of pouching options.  Answered patient's questions.   Examined patient lying, sitting, and standing in order to place the marking in the patient's visual field, away from any creases or abdominal contour issues and within the rectus muscle.  Attempted to mark below the patient's belt line. There is a crease which occurs lower when the patient is sitting up that should be avoided if possible.  Marked for colostomy in the LLQ  _5___ cm to the left of the umbilicus and __7__XA above/below the umbilicus.  Marked for ileostomy in the RLQ  __5__cm to the right of the umbilicus and  __1__ cm above/below the umbilicus.  Patient's abdomen cleansed with CHG wipes at site markings, allowed to air dry prior to marking. Plans for surgery today. [facility] Nurse team will follow up with patient after surgery for continued ostomy care and teaching on Monday. Julien Girt MSN, RN, [place], [place], [place]

## 2019-02-10 LAB — CBC
HCT: 38.4 % (ref 36.0–46.0)
Hemoglobin: 12.6 g/dL (ref 12.0–15.0)
MCH: 27.9 pg (ref 26.0–34.0)
MCHC: 32.8 g/dL (ref 30.0–36.0)
MCV: 85.1 fL (ref 80.0–100.0)
Platelets: 387 10*3/uL (ref 150–400)
RBC: 4.51 MIL/uL (ref 3.87–5.11)
RDW: 13.9 % (ref 11.5–15.5)
WBC: 21.1 10*3/uL — ABNORMAL HIGH (ref 4.0–10.5)
nRBC: 0 % (ref 0.0–0.2)

## 2019-02-10 LAB — BASIC METABOLIC PANEL
Anion gap: 10 (ref 5–15)
BUN: 8 mg/dL (ref 6–20)
CO2: 24 mmol/L (ref 22–32)
Calcium: 8.2 mg/dL — ABNORMAL LOW (ref 8.9–10.3)
Chloride: 97 mmol/L — ABNORMAL LOW (ref 98–111)
Creatinine, Ser: 0.56 mg/dL (ref 0.44–1.00)
GFR calc Af Amer: >60 mL/min (ref >60)
GFR calc non Af Amer: >60 mL/min (ref >60)
Glucose, Bld: 128 mg/dL — ABNORMAL HIGH (ref 70–99)
Potassium: 4.7 mmol/L (ref 3.5–5.1)
Sodium: 131 mmol/L — ABNORMAL LOW (ref 135–145)

## 2019-02-10 LAB — MAGNESIUM: Magnesium: 2.3 mg/dL (ref 1.7–2.4)

## 2019-02-10 MED ORDER — HYDROCORTISONE 1 % EX CREA
1.0000 "application " | TOPICAL_CREAM | Freq: Three times a day (TID) | CUTANEOUS | Status: DC | PRN
  Filled 2019-02-10: qty 28

## 2019-02-10 MED ORDER — POTASSIUM CHLORIDE IN NACL 20-0.45 MEQ/L-% IV SOLN
INTRAVENOUS | Status: DC
  Administered 2019-02-10: 12:00:00 via INTRAVENOUS
  Filled 2019-02-10 (×2): qty 1000

## 2019-02-10 MED ORDER — LACTATED RINGERS IV BOLUS: 1000.0000 mL | Freq: Three times a day (TID) | INTRAVENOUS | Status: AC | PRN

## 2019-02-10 MED ORDER — POTASSIUM CHLORIDE IN NACL 20-0.45 MEQ/L-% IV SOLN
INTRAVENOUS | Status: DC
  Administered 2019-02-10 – 2019-02-12 (×4): via INTRAVENOUS
  Filled 2019-02-10 (×4): qty 1000

## 2019-02-10 MED ORDER — GUAIFENESIN-DM 100-10 MG/5ML PO SYRP: 10.0000 mL | ORAL_SOLUTION | ORAL | Status: DC | PRN

## 2019-02-10 MED ORDER — ALUM & MAG HYDROXIDE-SIMETH 200-200-20 MG/5ML PO SUSP
30.0000 mL | Freq: Four times a day (QID) | ORAL | Status: DC | PRN
  Administered 2019-02-10: 30 mL via ORAL
  Filled 2019-02-10: qty 30

## 2019-02-10 MED ORDER — ACETAMINOPHEN 500 MG PO TABS
1000.0000 mg | ORAL_TABLET | Freq: Three times a day (TID) | ORAL | Status: DC
  Administered 2019-02-10 – 2019-02-14 (×14): 1000 mg via ORAL
  Filled 2019-02-10 (×14): qty 2

## 2019-02-10 MED ORDER — HYDROMORPHONE HCL 1 MG/ML IJ SOLN
1.0000 mg | Freq: Once | INTRAMUSCULAR | Status: AC
  Administered 2019-02-10: 1 mg via INTRAVENOUS
  Filled 2019-02-10: qty 1

## 2019-02-10 MED ORDER — ENALAPRILAT 1.25 MG/ML IV SOLN
0.6250 mg | Freq: Four times a day (QID) | INTRAVENOUS | Status: DC | PRN
  Filled 2019-02-10: qty 1

## 2019-02-10 MED ORDER — METOCLOPRAMIDE HCL 5 MG/ML IJ SOLN: 5.0000 mg | Freq: Three times a day (TID) | INTRAMUSCULAR | Status: DC | PRN

## 2019-02-10 MED ORDER — PHENOL 1.4 % MT LIQD: 1.0000 | OROMUCOSAL | Status: DC | PRN

## 2019-02-10 MED ORDER — MENTHOL 3 MG MT LOZG: 1.0000 | LOZENGE | OROMUCOSAL | Status: DC | PRN

## 2019-02-10 MED ORDER — MAGIC MOUTHWASH
15.0000 mL | Freq: Four times a day (QID) | ORAL | Status: DC | PRN
  Filled 2019-02-10: qty 15

## 2019-02-10 MED ORDER — HYDROCORTISONE (PERIANAL) 2.5 % EX CREA
1.0000 "application " | TOPICAL_CREAM | Freq: Four times a day (QID) | CUTANEOUS | Status: DC | PRN
  Filled 2019-02-10: qty 28.35

## 2019-02-10 MED ORDER — LIP MEDEX EX OINT
1.0000 "application " | TOPICAL_OINTMENT | Freq: Two times a day (BID) | CUTANEOUS | Status: DC
  Administered 2019-02-10 – 2019-02-14 (×6): 1 via TOPICAL
  Filled 2019-02-10 (×2): qty 7

## 2019-02-10 MED ORDER — PROCHLORPERAZINE EDISYLATE 10 MG/2ML IJ SOLN: 5.0000 mg | INTRAMUSCULAR | Status: DC | PRN

## 2019-02-10 MED ORDER — METHOCARBAMOL 1000 MG/10ML IJ SOLN
1000.0000 mg | Freq: Four times a day (QID) | INTRAVENOUS | Status: DC | PRN
  Filled 2019-02-10: qty 10

## 2019-02-10 MED ORDER — GABAPENTIN 300 MG PO CAPS
300.0000 mg | ORAL_CAPSULE | Freq: Every day | ORAL | Status: AC
  Administered 2019-02-10 – 2019-02-12 (×3): 300 mg via ORAL
  Filled 2019-02-10 (×3): qty 1

## 2019-02-10 MED ORDER — HYDROMORPHONE HCL 1 MG/ML IJ SOLN: 1.0000 mg | INTRAMUSCULAR | Status: DC | PRN

## 2019-02-10 NOTE — Progress Notes (Signed)
KORENA NASS [id number] 1961-05-24  CARE TEAM:  PCP: Terald Sleeper, PA-C  Outpatient Care Team: Patient Care Team: Theodoro Clock as PCP - General (Physician Assistant) Belva Crome, MD as PCP - Cardiology (Cardiology) Barrington Ellison, RN as [place] Management  Inpatient Treatment Team: Treatment Team: Attending Provider: [place] Eric J, DO; Consulting Physician: Edison Pace Md, MD; Registered Nurse: Donzetta Matters, RN; Registered Nurse: Kennith Center, RN; Registered Nurse: Corinna Lines, RN; Registered Nurse: Roselind Rily, RN; Chaplain: Myra Gianotti; Technician: Georgann Housekeeper, [place]; Rounding Team: Fanny Dance, MD; WOC Nurse: Tenna Child, RN; Technician: Merton Border, NT   Problem List:   Active Problems:   Acute diverticulitis   OSA on CPAP   Hyperlipidemia   Generalized anxiety disorder   Essential hypertension   Insomnia   Obesity (BMI 30-39.9)   Recurrent major depressive disorder, in partial remission ([place])   Adjustment disorder   Depression   History of MI (myocardial infarction)   Diverticulitis   Hypokalemia   Hypomagnesemia   Intra-abdominal abscess (HCC)   Left lower quadrant abdominal pain   1 Day Post-Op  02/09/2019  Surgeon: Victorino Sparrow ConnorMD  Assistant: Georganna Skeans MD, Jackson Latino PA-C  Procedure performed: Exploratory laparotomy, lysis of inflammatory adhesions, sigmoid colectomy, end colostomy; placement of incisional wound VAC  Procedure classification: Urgent  Infection present at the time of surgery: yes, purulent peritonitis  Preop diagnosis: Perforated sigmoid diverticulitis with intra-abdominal and interloop abscess formation, ongoing refractory inflammation despite 2 weeks of antibiotics and multiple drains  Post-op diagnosis/intraop findings: Same   Assessment  OK  [facility] Stay = 14 days)  Plan:  -IV ABx x 5 d postop -drain -f/u pathology  -colostomy care -wound care -pain control - PCA.  Standing tylenol/robaxin.  PRN Ativan, etc -anxiolysis -VTE prophylaxis- SCDs, etc -mobilize as tolerated to help recovery  30 minutes spent in review, evaluation, examination, counseling, and coordination of care.  More than 50% of that time was spent in counseling.  02/10/2019    Subjective: (Chief complaint)  Sore - PCS helping a little Wants daughter to talk w US  Objective:  Vital signs:  Vitals:   02/10/19 0443 02/10/19 0444 02/10/19 0500 02/10/19 0744  BP:  134/74    Pulse:  65    Resp: 15 18  18   Temp:  97.8 F (36.6 C)    TempSrc:  Oral    SpO2: 94% 95%  93%  Weight:   89.9 kg   Height:        Last BM Date: 02/08/19  Intake/Output   Yesterday:  07/10 0701 - 07/11 0700 In: 2067.9 [I.V.:1764.1; IV Piggyback:303.9] Out: 790 [Urine:560; Drains:130; Blood:100] This shift:  No intake/output data recorded.  Bowel function:  Flatus: No  BM:  No  Drain: Serosanguinous   Physical Exam:  General: Pt awake/alert/oriented x4 in mild acute distress Eyes: PERRL, normal EOM.  Sclera clear.  No icterus Neuro: CN II-XII intact w/o focal sensory/motor deficits. Lymph: No head/neck/groin lymphadenopathy Psych:  No delerium/psychosis/paranoia HENT: Normocephalic, Mucus membranes moist.  No thrush Neck: Supple, No tracheal deviation Chest: No chest wall pain w good excursion CV:  Pulses intact.  Regular rhythm MS: Normal AROM mjr joints.  No obvious deformity  Abdomen: Soft.  Mildy distended.  Mildly tender at incisions only.  Ostomy pink.  Wound vac at oincision clean.  No evidence of peritonitis.  No incarcerated hernias.  Ext:  No deformity.  No mjr edema.  No cyanosis Skin: No petechiae / purpura  Results:   Cultures: Recent Results (from the past 720 hour(s))  SARS Coronavirus 2 (CEPHEID - Performed in [place] hospital lab), Hosp Order     Status: None   Collection Time: 01/26/19 10:40 PM    Specimen: Nasopharyngeal Swab  Result Value Ref Range Status   SARS Coronavirus 2 NEGATIVE NEGATIVE Final    Comment: (NOTE) If result is NEGATIVE SARS-CoV-2 target nucleic acids are NOT DETECTED. The SARS-CoV-2 RNA is generally detectable in upper and lower  respiratory specimens during the acute phase of infection. The lowest  concentration of SARS-CoV-2 viral copies this assay can detect is 250  copies / mL. A negative result does not preclude SARS-CoV-2 infection  and should not be used as the sole basis for treatment or other  patient management decisions.  A negative result may occur with  improper specimen collection / handling, submission of specimen other  than nasopharyngeal swab, presence of viral mutation(s) within the  areas targeted by this assay, and inadequate number of viral copies  (<250 copies / mL). A negative result must be combined with clinical  observations, patient history, and epidemiological information. If result is POSITIVE SARS-CoV-2 target nucleic acids are DETECTED. The SARS-CoV-2 RNA is generally detectable in upper and lower  respiratory specimens dur ing the acute phase of infection.  Positive  results are indicative of active infection with SARS-CoV-2.  Clinical  correlation with patient history and other diagnostic information is  necessary to determine patient infection status.  Positive results do  not rule out bacterial infection or co-infection with other viruses. If result is PRESUMPTIVE POSTIVE SARS-CoV-2 nucleic acids MAY BE PRESENT.   A presumptive positive result was obtained on the submitted specimen  and confirmed on repeat testing.  While 2019 novel coronavirus  (SARS-CoV-2) nucleic acids may be present in the submitted sample  additional confirmatory testing may be necessary for epidemiological  and / or clinical management purposes  to differentiate between  SARS-CoV-2 and other Sarbecovirus currently known to infect humans.  If  clinically indicated additional testing with an alternate test  methodology [phone]) is advised. The SARS-CoV-2 RNA is generally  detectable in upper and lower respiratory sp ecimens during the acute  phase of infection. The expected result is Negative. Fact Sheet for Patients:  StrictlyIdeas.no Fact Sheet for Healthcare Providers: BankingDealers.co.za This test is not yet approved or cleared by the [place] FDA and has been authorized for detection and/or diagnosis of SARS-CoV-2 by FDA under an Emergency Use Authorization (EUA).  This EUA will remain in effect (meaning this test can be used) for the duration of the COVID-19 declaration under Section 564(b)(1) of the Act, 21 U.S.C. section 360bbb-3(b)(1), unless the authorization is terminated or revoked sooner. Performed at [facility], [address], [place], [place] [address]   Aerobic/Anaerobic Culture (surgical/deep wound)     Status: None   Collection Time: 01/29/19  2:19 PM   Specimen: Abscess  Result Value Ref Range Status   Specimen Description ABSCESS JP DRAINAGE  Final   Special Requests NONE  Final   Gram Stain   Final    ABUNDANT WBC PRESENT, PREDOMINANTLY PMN ABUNDANT GRAM POSITIVE COCCI    Culture   Final    FEW ESCHERICHIA COLI MODERATE VIRIDANS STREPTOCOCCUS FEW CANDIDA ALBICANS NO ANAEROBES ISOLATED Performed at [facility], [address] [place], [place] [address]  Report Status 02/03/2019 FINAL  Final   Organism ID, Bacteria ESCHERICHIA COLI  Final      Susceptibility   Escherichia coli - MIC*    AMPICILLIN <=2 SENSITIVE Sensitive     CEFAZOLIN <=4 SENSITIVE Sensitive     CEFEPIME <=1 SENSITIVE Sensitive     CEFTAZIDIME <=1 SENSITIVE Sensitive     CEFTRIAXONE <=1 SENSITIVE Sensitive     CIPROFLOXACIN <=0.25 SENSITIVE Sensitive     GENTAMICIN <=1 SENSITIVE Sensitive     IMIPENEM <=0.25 SENSITIVE Sensitive     TRIMETH/SULFA <=20 SENSITIVE  Sensitive     AMPICILLIN/SULBACTAM <=2 SENSITIVE Sensitive     PIP/TAZO <=4 SENSITIVE Sensitive     Extended ESBL NEGATIVE Sensitive     * FEW ESCHERICHIA COLI  Aerobic/Anaerobic Culture (surgical/deep wound)     Status: None   Collection Time: 02/03/19 10:21 AM   Specimen: Pelvis; Abscess  Result Value Ref Range Status   Specimen Description PELVIS  Final   Special Requests Immunocompromised  Final   Gram Stain   Final    ABUNDANT WBC PRESENT, PREDOMINANTLY PMN RARE GRAM POSITIVE COCCI IN PAIRS IN CHAINS    Culture   Final    No growth aerobically or anaerobically. Performed at [facility], [place] [address], [place], [place] [address]    Report Status 02/08/2019 FINAL  Final  Aerobic/Anaerobic Culture (surgical/deep wound)     Status: Abnormal   Collection Time: 02/03/19 10:22 AM   Specimen: Abscess  Result Value Ref Range Status   Specimen Description ABSCESS  Final   Special Requests Immunocompromised  Final   Gram Stain   Final    ABUNDANT WBC PRESENT, PREDOMINANTLY PMN ABUNDANT GRAM NEGATIVE RODS FEW GRAM POSITIVE COCCI IN CLUSTERS    Culture (A)  Final    MULTIPLE ORGANISMS PRESENT, NONE PREDOMINANT NO ANAEROBES ISOLATED Performed at [facility], [address] [place], [place] [address]    Report Status 02/08/2019 FINAL  Final  Surgical pcr screen     Status: None   Collection Time: 02/07/19  6:43 AM   Specimen: Nasal Mucosa; Nasal Swab  Result Value Ref Range Status   MRSA, PCR NEGATIVE NEGATIVE Final   Staphylococcus aureus NEGATIVE NEGATIVE Final    Comment: (NOTE) The Xpert SA Assay (FDA approved for NASAL specimens in patients 25 years of age and older), is one component of a comprehensive surveillance program. It is not intended to diagnose infection nor to guide or monitor treatment. Performed at [facility], [place] [address] [place], [place] [address]     Labs: Results for orders placed or performed during the hospital encounter  of 01/26/19 (from the past 48 hour(s))  CBC     Status: Abnormal   Collection Time: 02/09/19  5:03 AM  Result Value Ref Range   WBC 17.2 (H) 4.0 - 10.5 K/uL   RBC 4.40 3.87 - 5.11 MIL/uL   Hemoglobin 12.1 12.0 - 15.0 g/dL   HCT 37.2 36.0 - 46.0 %   MCV 84.5 80.0 - 100.0 fL   MCH 27.5 26.0 - 34.0 pg   MCHC 32.5 30.0 - 36.0 g/dL   RDW 13.7 11.5 - 15.5 %   Platelets 329 150 - 400 K/uL   nRBC 0.0 0.0 - 0.2 %    Comment: Performed at [facility], [place] [address] [place], [place] [address]  Basic metabolic panel     Status: Abnormal   Collection Time: 02/09/19  5:03 AM  Result Value Ref Range   Sodium 135 135 - 145 mmol/L   Potassium 3.5 3.5 - 5.1 mmol/L    Comment: DELTA CHECK NOTED   Chloride 100 98 - 111 mmol/L   CO2 24 22 - 32 mmol/L   Glucose, Bld 107 (H) 70 - 99 mg/dL   BUN 6 6 - 20 mg/dL   Creatinine, Ser 0.57 0.44 - 1.00 mg/dL   Calcium 8.4 (L) 8.9 - 10.3 mg/dL   GFR calc non Af Amer >60 >60 mL/min   GFR calc Af Amer >60 >60 mL/min   Anion gap 11 5 - 15    Comment: Performed at [facility] [address], [place], [place] [address]  Magnesium     Status: Abnormal   Collection Time: 02/09/19  5:03 AM  Result Value Ref Range   Magnesium 1.5 (L) 1.7 - 2.4 mg/dL    Comment: Performed at [facility] [address] [place], [place] [address]  Type and screen [place]     Status: None   Collection Time: 02/09/19 10:29 AM  Result Value Ref Range   ABO/RH(D) O POS    Antibody Screen NEG    Sample Expiration      02/12/2019,2359 Performed at [facility], [place] [address] [place], [place] [address]   ABO/Rh     Status: None   Collection Time: 02/09/19 10:29 AM  Result Value Ref Range   ABO/RH(D)      O POS Performed at [facility] [address] [place], [place] [address]   CBC     Status: Abnormal   Collection Time: 02/10/19  4:45 AM  Result Value Ref Range   WBC 21.1 (H) 4.0 - 10.5 K/uL   RBC 4.51 3.87 - 5.11  MIL/uL   Hemoglobin 12.6 12.0 - 15.0 g/dL   HCT 38.4 36.0 - 46.0 %   MCV 85.1 80.0 - 100.0 fL   MCH 27.9 26.0 - 34.0 pg   MCHC 32.8 30.0 - 36.0 g/dL   RDW 13.9 11.5 - 15.5 %   Platelets 387 150 - 400 K/uL   nRBC 0.0 0.0 - 0.2 %    Comment: Performed at [facility], [place] [address] [place], [place] [address]  Basic metabolic panel     Status: Abnormal   Collection Time: 02/10/19  4:45 AM  Result Value Ref Range   Sodium 131 (L) 135 - 145 mmol/L   Potassium 4.7 3.5 - 5.1 mmol/L    Comment: DELTA CHECK NOTED   Chloride 97 (L) 98 - 111 mmol/L   CO2 24 22 - 32 mmol/L   Glucose, Bld 128 (H) 70 - 99 mg/dL   BUN 8 6 - 20 mg/dL   Creatinine, Ser 0.56 0.44 - 1.00 mg/dL   Calcium 8.2 (L) 8.9 - 10.3 mg/dL   GFR calc non Af Amer >60 >60 mL/min   GFR calc Af Amer >60 >60 mL/min   Anion gap 10 5 - 15    Comment: Performed at [facility], [place] [address], [place], [place] [address]  Magnesium     Status: None   Collection Time: 02/10/19  4:45 AM  Result Value Ref Range   Magnesium 2.3 1.7 - 2.4 mg/dL    Comment: Performed at [facility] [address] [place], [place] [address]    Imaging / Studies: Ct Abdomen Pelvis W Contrast  Result Date: 02/08/2019 CLINICAL DATA:  Left lower quadrant pain, drain site, loose stools EXAM: CT ABDOMEN AND PELVIS WITH CONTRAST TECHNIQUE: Multidetector CT imaging of the abdomen and pelvis was performed using the standard protocol following bolus administration of intravenous contrast. CONTRAST:  113mL OMNIPAQUE IOHEXOL 300 MG/ML SOLN, additional oral enteric contrast COMPARISON:  02/02/2019, 02/03/2019 FINDINGS: Lower chest: Small right pleural effusion, decreased compared to prior examinations. Coronary artery calcifications and/or stents. Hepatobiliary: No solid liver abnormality is seen. No gallstones, gallbladder wall thickening, or biliary dilatation. Pancreas: Unremarkable. No pancreatic ductal dilatation or surrounding inflammatory  changes. Spleen: Normal in size without significant abnormality. Adrenals/Urinary Tract: Adrenal glands are unremarkable. Kidneys are normal, without renal calculi, solid lesion, or hydronephrosis. Bladder is unremarkable. Stomach/Bowel: Stomach is within normal limits. There is redemonstrated extensive thickening and fat stranding about the sigmoid colon and adjacent mid small bowel loops. There has been interval repositioning of a left lower quadrant approach pigtail drainage catheter, which now appears to be positioned within an air and fluid collection anterior to the mid sigmoid (series 3, image 71). This collection is not significantly changed in size, measuring approximately 4.5 cm. There are small locules of air about the catheter tract and within the left rectus musculature (series 3, image 69). Vascular/Lymphatic: Aortic atherosclerosis. No enlarged abdominal or pelvic lymph nodes. Reproductive: Uterine fibroids. Other: Small fat containing umbilical hernia. There has been interval placement of a left gluteal approach pigtail drainage catheter in a loculated appearing fluid collection about the superior aspect of the uterus and urinary bladder, which is substantially decreased in size and nearly resolved (series 3, image 75). Musculoskeletal: No acute or significant osseous findings. IMPRESSION: 1. There is redemonstrated extensive thickening and fat stranding about the sigmoid colon and adjacent mid small bowel loops. Findings are consistent with acute diverticulitis complicated by perforation and abscess. 2. There has been interval repositioning of a left lower quadrant approach pigtail drainage catheter, which now appears to be positioned within an air and fluid collection anterior to the mid sigmoid (series 3, image 71). This collection is not significantly changed in size, measuring approximately 4.5 cm. There are small locules of air about the catheter tract and within the left rectus musculature  (series 3, image 69) without a discrete fluid collection appreciated. 3. There has been interval placement of a left gluteal approach pigtail drainage catheter in a loculated appearing fluid collection about the superior aspect of the uterus and urinary bladder, which is substantially decreased in size and nearly resolved (series 3, image 75). 4.  Other chronic and incidental findings as detailed above. Electronically Signed   By: Eddie Candle M.D.   On: 02/08/2019 12:49    Medications / Allergies: per chart  Antibiotics: Anti-infectives (From admission, onward)   Start     Dose/Rate Route Frequency Ordered Stop   02/02/19 1415  fluconazole (DIFLUCAN) IVPB 400 mg     400 mg 100 mL/hr over 120 Minutes Intravenous Every 24 hours 02/02/19 1408     01/30/19 1200  piperacillin-tazobactam (ZOSYN) IVPB 3.375 g     3.375 g 12.5 mL/hr over 240 Minutes Intravenous Every 8 hours 01/30/19 1045     01/27/19 1015  ciprofloxacin (CIPRO) IVPB 400 mg  Status:  Discontinued     400 mg 200 mL/hr over 60 Minutes Intravenous Every 12 hours 01/27/19 0117 01/30/19 1039   01/27/19 0700  metroNIDAZOLE (FLAGYL) IVPB 500 mg  Status:  Discontinued     500 mg 100 mL/hr over 60  Minutes Intravenous Every 8 hours 01/27/19 0117 01/30/19 1039   01/26/19 2245  ciprofloxacin (CIPRO) IVPB 400 mg     400 mg 200 mL/hr over 60 Minutes Intravenous  Once 01/26/19 2232 01/27/19 0026   01/26/19 2245  metroNIDAZOLE (FLAGYL) IVPB 500 mg     500 mg 100 mL/hr over 60 Minutes Intravenous  Once 01/26/19 2232 01/27/19 0010        Note: Portions of this report may have been transcribed using voice recognition software. Every effort was made to ensure accuracy; however, inadvertent computerized transcription errors may be present.   Any transcriptional errors that result from this process are unintentional.     Adin Hector, MD, FACS, MASCRS Gastrointestinal and Minimally Invasive Surgery    [address], [place]  [place], [place] [address] [phone] Main / Paging [phone] Fax

## 2019-02-10 NOTE — Progress Notes (Signed)
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 sae pcp 1 day PTA  For ongoing diarrhea and abdominal pain X5 DAYS-and was diagnosed clinically as diverticulitis and started on Augmentin and Flagyl. Patient says that she did not feel better and in fact her bloating got worse with significant worsening of abdominal pain.   In the ER, CT scan of the abdomen pelvis showed acute complicated sigmoid diverticulitis- 3.8 cm abscess adjacent to sigmoid colon. Small to moderate volume of fluid within the pelvis with questionable rim enhancement.  General surgery  CCS was consulted advised IV antibiotics and IR drainage of the abscess and was admitted.  Patient had low-grade fever and nonbloody diarrhea.   Subjective: Patient seen and examined at bedside, resting comfortably.  Underwent exploratory laparotomy with sigmoid colectomy, end colostomy with lysis of adhesions secondary to bowel perforation with abscess yesterday.  Continues on IV antibiotics.  On clear liquid diet.  Ostomy without output as of yet.  Pain controlled on PCA.  Feels tired this morning.  No other complaints at this time.  Denies headache, no fever/chills/night sweats, no nausea/vomiting/diarrhea, no chest pain, no palpitations, no shortness of breath, no abdominal discomfort, no cough/congestion, no issues with bowel/bladder function, no paresthesias.  No acute events overnight per nursing staff.  Assessment & Plan:  Acute perforated sigmoid diverticulitis with intra-abdominal abscess:  Pt with 4-5 prior episodes of diverticulitis that did not require hospitalization.  Colonoscopy abt 5 yrs diverticulosis. S/P dain placement 6/29 for abscess. WBC was trending up, repeat CT 7/3  persistent acute left lower quadrant sigmoid diverticulitis with 2 pelvic abscesses, left lower quadrant abscess drain was retracted into the pericolonic fat.  S/p replacement of  LLQ drain and new pelvic drain 7/4.  Drain having serosanguineous output.   --General surgery following, appreciate assistance -- WBC count trending up 13.1-->14.1-->19.4-->17.2-->21.1 --CT abdomen/pelvis 02/08/2019 with thickening/fat stranding sigmoid colon consistent with acute diverticulitis complicated by perforation/abscess. --s/p Exploratory laparotomy, lysis of inflammatory adhesions, sigmoid colectomy,end colostomy;placement of incisional wound VAC on 7/10 --Continues on Zosyn for empiric antimicrobial coverage; surgery plans 5-day postop course --Continue wound/ostomy care  Chronic diastolic CHF:  Compensated. Pt retaining fluid but no shortness of breath.  --Continue HCTZ.    Swelling of the right upper extremity/and right lower leg.   Swelling is improving-cont HCTZ.  U/S duplex negative for DVT.     Anxiety/depression  Continue home Xanax, buspirone, Cymbalta and trazodone.    Essential hypertension: BP fairly controlled on Metoprolol, HCTZ losartan  CAD with prior stent: Cont asa.  Prediabetes: A1c 5.8.  We will advise her to follow-up with PCP, for now continue on diet control.  HLD:  on statins  OSA: On Nocturnal CPAP but patient has been refusing.    Hypokalemia:  Potassium 4.7 this morning --Will discontinue scheduled potassium 40 mEq twice daily today, will continue to monitor electrolytes to include magnesium daily and replace as needed   DVT prophylaxis:   Lovenox  Code Status: Full Code Family Communication:  none Disposition Plan: Remains inpatient pending clinical improvement.     Consultants:  CCS  Procedures:  DRAIN PLACEMENT FOR ABSCESS  Left Abd Drain replacement and new drain ln left gluteal 7/4  Exploratory laparotomy, lysis of  inflammatory adhesions, sigmoid  colectomy,end colostomy;placement of incisional wound VAC 02/09/2019  Antimicrobials: Anti-infectives (From admission, onward)   Start     Dose/Rate Route Frequency Ordered Stop   02/02/19 1415  fluconazole (DIFLUCAN) IVPB 400 mg     400 mg 100 mL/hr over 120 Minutes Intravenous Every 24 hours 02/02/19 1408 02/14/19 0822   01/30/19 1200  piperacillin-tazobactam (ZOSYN) IVPB 3.375 g     3.375 g 12.5 mL/hr over 240 Minutes Intravenous Every 8 hours 01/30/19 1045 02/14/19 0807   01/27/19 1015  ciprofloxacin (CIPRO) IVPB 400 mg  Status:  Discontinued     400 mg 200 mL/hr over 60 Minutes Intravenous Every 12 hours 01/27/19 0117 01/30/19 1039   01/27/19 0700  metroNIDAZOLE (FLAGYL) IVPB 500 mg  Status:  Discontinued     500 mg 100 mL/hr over 60 Minutes Intravenous Every 8 hours 01/27/19 0117 01/30/19 1039   01/26/19 2245  ciprofloxacin (CIPRO) IVPB 400 mg     400 mg 200 mL/hr over 60 Minutes Intravenous  Once 01/26/19 2232 01/27/19 0026   01/26/19 2245  metroNIDAZOLE (FLAGYL) IVPB 500 mg     500 mg 100 mL/hr over 60 Minutes Intravenous  Once 01/26/19 2232 01/27/19 0010       Objective: Vitals:   02/10/19 0500 02/10/19 0744 02/10/19 1034 02/10/19 1255  BP:   104/65   Pulse:   (!) 57   Resp:  18 18 18   Temp:   97.7 F (36.5 C)   TempSrc:   Oral   SpO2:  93% 95% 95%  Weight: 89.9 kg     Height:        Intake/Output Summary (Last 24 hours) at 02/10/2019 1419 Last data filed at 02/10/2019 1034 Gross per 24 hour  Intake 1387.94 ml  Output 440 ml  Net 947.94 ml   Filed Weights   02/09/19 0641 02/09/19 1021 02/10/19 0500  Weight: 89.9 kg 89.9 kg 89.9 kg   Weight change: 1.9 kg  Body mass index is 35.12 kg/m.  Intake/Output from previous day: 07/10 0701 - 07/11 0700 In: 2067.9 [I.V.:1764.1; IV Piggyback:303.9] Out: 790 [Urine:560; Drains:130; Blood:100] Intake/Output this shift: Total I/O In: 120 [P.O.:120] Out: -   Examination:  General exam: Calm, comfortable, not  in acute distress, fatigued HEENT:Oral mucosa moist, Ear/Nose WNL grossly, dentition normal. Respiratory system: Bilateral equal air entry, no crackles and wheezing, no use of accessory muscle, non tender on palpation. Cardiovascular system: regular rate and rhythm, S1 & S2 heard, No JVD/murmurs. Gastrointestinal system: Abdomen soft, mild generalized tenderness, ostomy site noted no stool present, wound VAC site noted Nervous System: Alert, awake and oriented at baseline. Non focal Extremities: No edema, distal peripheral pulses palpable.  Skin: No rashes,no icterus. MSK: Normal muscle bulk,tone, power  Medications:  Scheduled Meds: . acetaminophen  1,000 mg Oral TID  . aspirin EC  81 mg Oral Daily  . busPIRone  15 mg Oral TID  . DULoxetine  60 mg Oral Daily  . enoxaparin (LOVENOX) injection  40 mg Subcutaneous Q24H  . feeding supplement  1 Container Oral TID BM  . gabapentin  300 mg Oral QHS  . guaiFENesin  600 mg Oral BID  . losartan  100 mg Oral Daily   And  . hydrochlorothiazide  25 mg Oral Daily  . HYDROmorphone   Intravenous Q4H  . lip balm  1 application Topical BID  . metoprolol tartrate  25 mg Oral BID  . potassium chloride  40 mEq Oral BID  .  rosuvastatin  10 mg Oral Daily   Continuous Infusions: . 0.45 % NaCl with KCl 20 mEq / L 100 mL/hr at 02/10/19 1135  . fluconazole (DIFLUCAN) IV 400 mg (02/10/19 0944)  . lactated ringers    . lactated ringers 50 mL/hr at 02/10/19 0944  . methocarbamol (ROBAXIN) IV    . piperacillin-tazobactam (ZOSYN)  IV 3.375 g (02/10/19 1300)    Data Reviewed: I have personally reviewed following labs and imaging studies  CBC: Recent Labs  Lab 02/06/19 0220 02/07/19 0346 02/08/19 0339 02/09/19 0503 02/10/19 0445  WBC 13.1* 14.1* 19.4* 17.2* 21.1*  HGB 11.5* 12.6 12.6 12.1 12.6  HCT 35.1* 38.1 37.9 37.2 38.4  MCV 85.2 83.9 83.8 84.5 85.1  PLT 317 366 358 329 502   Basic Metabolic Panel: Recent Labs  Lab 02/06/19 0220  02/07/19 0346 02/08/19 0339 02/09/19 0503 02/10/19 0445  NA 136 136 132* 135 131*  K 4.4 4.6 2.8* 3.5 4.7  CL 97* 98 96* 100 97*  CO2 30 26 26 24 24   GLUCOSE 82 92 139* 107* 128*  BUN <5* <5* <5* 6 8  CREATININE 0.59 0.62 0.60 0.57 0.56  CALCIUM 8.8* 8.9 8.5* 8.4* 8.2*  MG  --   --   --  1.5* 2.3   GFR: Estimated Creatinine Clearance: 81.6 mL/min (by C-G formula based on SCr of 0.56 mg/dL). Liver Function Tests: No results for input(s): AST, ALT, ALKPHOS, BILITOT, PROT, ALBUMIN in the last 168 hours. No results for input(s): LIPASE, AMYLASE in the last 168 hours. No results for input(s): AMMONIA in the last 168 hours. Coagulation Profile: No results for input(s): INR, PROTIME in the last 168 hours. Cardiac Enzymes: No results for input(s): CKTOTAL, CKMB, CKMBINDEX, TROPONINI in the last 168 hours. BNP (last 3 results) No results for input(s): PROBNP in the last 8760 hours. HbA1C: No results for input(s): HGBA1C in the last 72 hours. CBG: No results for input(s): GLUCAP in the last 168 hours. Lipid Profile: No results for input(s): CHOL, HDL, LDLCALC, TRIG, CHOLHDL, LDLDIRECT in the last 72 hours. Thyroid Function Tests: No results for input(s): TSH, T4TOTAL, FREET4, T3FREE, THYROIDAB in the last 72 hours. Anemia Panel: No results for input(s): VITAMINB12, FOLATE, FERRITIN, TIBC, IRON, RETICCTPCT in the last 72 hours. Sepsis Labs: No results for input(s): PROCALCITON, LATICACIDVEN in the last 168 hours.  Recent Results (from the past 240 hour(s))  Aerobic/Anaerobic Culture (surgical/deep wound)     Status: None   Collection Time: 02/03/19 10:21 AM   Specimen: Pelvis; Abscess  Result Value Ref Range Status   Specimen Description PELVIS  Final   Special Requests Immunocompromised  Final   Gram Stain   Final    ABUNDANT WBC PRESENT, PREDOMINANTLY PMN RARE GRAM POSITIVE COCCI IN PAIRS IN CHAINS    Culture   Final    No growth aerobically or anaerobically. Performed at  [facility], [place] [address], [place], [place] [address]    Report Status 02/08/2019 FINAL  Final  Aerobic/Anaerobic Culture (surgical/deep wound)     Status: Abnormal   Collection Time: 02/03/19 10:22 AM   Specimen: Abscess  Result Value Ref Range Status   Specimen Description ABSCESS  Final   Special Requests Immunocompromised  Final   Gram Stain   Final    ABUNDANT WBC PRESENT, PREDOMINANTLY PMN ABUNDANT GRAM NEGATIVE RODS FEW GRAM POSITIVE COCCI IN CLUSTERS    Culture (A)  Final    MULTIPLE ORGANISMS PRESENT, NONE PREDOMINANT NO ANAEROBES  ISOLATED Performed at [facility], [place] [address] [place], [place] [address]    Report Status 02/08/2019 FINAL  Final  Surgical pcr screen     Status: None   Collection Time: 02/07/19  6:43 AM   Specimen: Nasal Mucosa; Nasal Swab  Result Value Ref Range Status   MRSA, PCR NEGATIVE NEGATIVE Final   Staphylococcus aureus NEGATIVE NEGATIVE Final    Comment: (NOTE) The Xpert SA Assay (FDA approved for NASAL specimens in patients 64 years of age and older), is one component of a comprehensive surveillance program. It is not intended to diagnose infection nor to guide or monitor treatment. Performed at [facility], [place] [address] [place], [place] [address]       Radiology Studies: No results found.    LOS: 14 days   Time spent: 32 minutes. More than 50% of that time was spent in counseling and/or coordination of care.  Sherise Geerdes J [place] DO [facility]  02/10/2019, 2:19 PM

## 2019-02-10 NOTE — Plan of Care (Signed)

## 2019-02-11 LAB — BASIC METABOLIC PANEL
Anion gap: 7 (ref 5–15)
BUN: 9 mg/dL (ref 6–20)
CO2: 27 mmol/L (ref 22–32)
Calcium: 8.2 mg/dL — ABNORMAL LOW (ref 8.9–10.3)
Chloride: 99 mmol/L (ref 98–111)
Creatinine, Ser: 0.52 mg/dL (ref 0.44–1.00)
GFR calc Af Amer: >60 mL/min (ref >60)
GFR calc non Af Amer: >60 mL/min (ref >60)
Glucose, Bld: 104 mg/dL — ABNORMAL HIGH (ref 70–99)
Potassium: 4.9 mmol/L (ref 3.5–5.1)
Sodium: 133 mmol/L — ABNORMAL LOW (ref 135–145)

## 2019-02-11 LAB — CBC
HCT: 31 % — ABNORMAL LOW (ref 36.0–46.0)
Hemoglobin: 10 g/dL — ABNORMAL LOW (ref 12.0–15.0)
MCH: 28 pg (ref 26.0–34.0)
MCHC: 32.3 g/dL (ref 30.0–36.0)
MCV: 86.8 fL (ref 80.0–100.0)
Platelets: 346 10*3/uL (ref 150–400)
RBC: 3.57 MIL/uL — ABNORMAL LOW (ref 3.87–5.11)
RDW: 14.1 % (ref 11.5–15.5)
WBC: 19.3 10*3/uL — ABNORMAL HIGH (ref 4.0–10.5)
nRBC: 0 % (ref 0.0–0.2)

## 2019-02-11 LAB — MAGNESIUM: Magnesium: 2.1 mg/dL (ref 1.7–2.4)

## 2019-02-11 NOTE — Progress Notes (Signed)
Heather Lam [id number] 05/23/1961  CARE TEAM:  PCP: Terald Sleeper, PA-C  Outpatient Care Team: Patient Care Team: Theodoro Clock as PCP - General (Physician Assistant) Belva Crome, MD as PCP - Cardiology (Cardiology) Barrington Ellison, RN as Alexis Management  Inpatient Treatment Team: Treatment Team: Attending Provider: [place] Eric J, DO; Consulting Physician: Edison Pace Md, MD; Registered Nurse: Donzetta Matters, RN; Registered Nurse: Kennith Center, RN; Registered Nurse: Corinna Lines, RN; Registered Nurse: Roselind Rily, RN; Chaplain: Myra Gianotti; Technician: Georgann Housekeeper, [place]; Rounding Team: Fanny Dance, MD; [place] Nurse: Tenna Child, RN; Technician: Merton Border, NT; Registered Nurse: Fransisco Hertz, RN; Physical Therapist: Philippa Sicks, PT; Registered Nurse: Renato Shin, RN; Case Manager: Bartholomew Crews, RN; Social Worker: Gabrielle Dare   Problem List:   Active Problems:   Acute diverticulitis   OSA on CPAP   Hyperlipidemia   Generalized anxiety disorder   Essential hypertension   Insomnia   Obesity (BMI 30-39.9)   Recurrent major depressive disorder, in partial remission ([place])   Adjustment disorder   Depression   History of MI (myocardial infarction)   Diverticulitis   Hypokalemia   Hypomagnesemia   Intra-abdominal abscess (HCC)   Left lower quadrant abdominal pain   2 Days Post-Op  02/09/2019  Surgeon: Victorino Sparrow ConnorMD  Assistant: Georganna Skeans MD, Jackson Latino PA-C  Procedure performed:  Exploratory laparotomy lysis of inflammatory adhesions sigmoid colectomy end colostomy placement of incisional wound VAC  Procedure classification: Urgent  Infection present at the time of surgery: yes, purulent peritonitis  Post-op diagnosis/intraop findings: Perforated sigmoid diverticulitis intra-abdominal and interloop abscess formation ongoing refractory  inflammation despite 2 weeks of antibiotics and multiple drains  Assessment  Improved  [facility] Stay = 15 days)  Plan:  -Expected postop ileus with diverticulitis and abscess and peritonitis.  Not nauseated.  Keep on clears for now.  Once has flatus, advance diet. -IV ABx x 5 d postop.  Wbc POD#1 spike expected - falling -drain -CT scan in 5-10 days if not better or worse to r/o abscess - hold off for now -f/u pathology -colostomy care -wound care.  Anticipate wound VAC changes starting tomorrow, Monday.  Wound ostomy consultation starting tomorrow -pain control - PCA.  Standing tylenol/robaxin.  PRN Ativan, etc -anxiolysis -VTE prophylaxis- SCDs, etc -mobilize as tolerated to help recovery  30 minutes spent in review, evaluation, examination, counseling, and coordination of care.  More than 50% of that time was spent in counseling.  02/11/2019    Subjective: (Chief complaint)  Feeling less sore.  Tolerating a few bites of Jell-O and sips of clears.  Got up and walked in hallways.  Wants to try and get up more.  Objective:  Vital signs:  Vitals:   02/11/19 0114 02/11/19 0505 02/11/19 0531 02/11/19 0814  BP: 126/69  127/70   Pulse: 69  65   Resp: 11 14 13 10   Temp: 97.8 F (36.6 C)  97.9 F (36.6 C)   TempSrc: Oral  Oral   SpO2: 97% 96% 98% 96%  Weight:      Height:        Last BM Date: 02/09/19  Intake/Output   Yesterday:  07/11 0701 - 07/12 0700 In: 2308.2 [P.O.:360; I.V.:1656.7; IV Piggyback:291.5] Out: 2060 [Urine:1950; Drains:110] This shift:  No intake/output data recorded.  Bowel function:  Flatus: No  BM:  No  Drain:  Serosanguinous   Physical Exam:  General: Pt awake/alert/oriented x4 in no acute distress.  Calm.  Smiling. Eyes: PERRL, normal EOM.  Sclera clear.  No icterus Neuro: CN II-XII intact w/o focal sensory/motor deficits. Lymph: No head/neck/groin lymphadenopathy Psych:  No delerium/psychosis/paranoia HENT: Normocephalic,  Mucus membranes moist.  No thrush Neck: Supple, No tracheal deviation Chest: No chest wall pain w good excursion CV:  Pulses intact.  Regular rhythm MS: Normal AROM mjr joints.  No obvious deformity  Abdomen: Soft.  Mildy distended.  Mildly tender at incisions only.  Ostomy pink.  No gas nor succus wound vac at incision clean.  No evidence of peritonitis.  No incarcerated hernias.  Ext:  No deformity.  No mjr edema.  No cyanosis Skin: No petechiae / purpura  Results:   Cultures: Recent Results (from the past 720 hour(s))  SARS Coronavirus 2 (CEPHEID - Performed in [place] hospital lab), Hosp Order     Status: None   Collection Time: 01/26/19 10:40 PM   Specimen: Nasopharyngeal Swab  Result Value Ref Range Status   SARS Coronavirus 2 NEGATIVE NEGATIVE Final    Comment: (NOTE) If result is NEGATIVE SARS-CoV-2 target nucleic acids are NOT DETECTED. The SARS-CoV-2 RNA is generally detectable in upper and lower  respiratory specimens during the acute phase of infection. The lowest  concentration of SARS-CoV-2 viral copies this assay can detect is 250  copies / mL. A negative result does not preclude SARS-CoV-2 infection  and should not be used as the sole basis for treatment or other  patient management decisions.  A negative result may occur with  improper specimen collection / handling, submission of specimen other  than nasopharyngeal swab, presence of viral mutation(s) within the  areas targeted by this assay, and inadequate number of viral copies  (<250 copies / mL). A negative result must be combined with clinical  observations, patient history, and epidemiological information. If result is POSITIVE SARS-CoV-2 target nucleic acids are DETECTED. The SARS-CoV-2 RNA is generally detectable in upper and lower  respiratory specimens dur ing the acute phase of infection.  Positive  results are indicative of active infection with SARS-CoV-2.  Clinical  correlation with patient  history and other diagnostic information is  necessary to determine patient infection status.  Positive results do  not rule out bacterial infection or co-infection with other viruses. If result is PRESUMPTIVE POSTIVE SARS-CoV-2 nucleic acids MAY BE PRESENT.   A presumptive positive result was obtained on the submitted specimen  and confirmed on repeat testing.  While 2019 novel coronavirus  (SARS-CoV-2) nucleic acids may be present in the submitted sample  additional confirmatory testing may be necessary for epidemiological  and / or clinical management purposes  to differentiate between  SARS-CoV-2 and other Sarbecovirus currently known to infect humans.  If clinically indicated additional testing with an alternate test  methodology [phone]) is advised. The SARS-CoV-2 RNA is generally  detectable in upper and lower respiratory sp ecimens during the acute  phase of infection. The expected result is Negative. Fact Sheet for Patients:  StrictlyIdeas.no Fact Sheet for Healthcare Providers: BankingDealers.co.za This test is not yet approved or cleared by the [place] FDA and has been authorized for detection and/or diagnosis of SARS-CoV-2 by FDA under an Emergency Use Authorization (EUA).  This EUA will remain in effect (meaning this test can be used) for the duration of the COVID-19 declaration under Section 564(b)(1) of the Act, 21 U.S.C. section 360bbb-3(b)(1), unless the authorization is terminated or  revoked sooner. Performed at [facility], [address] [place], [place] [address]   Aerobic/Anaerobic Culture (surgical/deep wound)     Status: None   Collection Time: 01/29/19  2:19 PM   Specimen: Abscess  Result Value Ref Range Status   Specimen Description ABSCESS JP DRAINAGE  Final   Special Requests NONE  Final   Gram Stain   Final    ABUNDANT WBC PRESENT, PREDOMINANTLY PMN ABUNDANT GRAM POSITIVE COCCI    Culture   Final     FEW ESCHERICHIA COLI MODERATE VIRIDANS STREPTOCOCCUS FEW CANDIDA ALBICANS NO ANAEROBES ISOLATED Performed at [facility], [address] [place], [place] [address]    Report Status 02/03/2019 FINAL  Final   Organism ID, Bacteria ESCHERICHIA COLI  Final      Susceptibility   Escherichia coli - MIC*    AMPICILLIN <=2 SENSITIVE Sensitive     CEFAZOLIN <=4 SENSITIVE Sensitive     CEFEPIME <=1 SENSITIVE Sensitive     CEFTAZIDIME <=1 SENSITIVE Sensitive     CEFTRIAXONE <=1 SENSITIVE Sensitive     CIPROFLOXACIN <=0.25 SENSITIVE Sensitive     GENTAMICIN <=1 SENSITIVE Sensitive     IMIPENEM <=0.25 SENSITIVE Sensitive     TRIMETH/SULFA <=20 SENSITIVE Sensitive     AMPICILLIN/SULBACTAM <=2 SENSITIVE Sensitive     PIP/TAZO <=4 SENSITIVE Sensitive     Extended ESBL NEGATIVE Sensitive     * FEW ESCHERICHIA COLI  Aerobic/Anaerobic Culture (surgical/deep wound)     Status: None   Collection Time: 02/03/19 10:21 AM   Specimen: Pelvis; Abscess  Result Value Ref Range Status   Specimen Description PELVIS  Final   Special Requests Immunocompromised  Final   Gram Stain   Final    ABUNDANT WBC PRESENT, PREDOMINANTLY PMN RARE GRAM POSITIVE COCCI IN PAIRS IN CHAINS    Culture   Final    No growth aerobically or anaerobically. Performed at [facility], [place] [address], [place], [place] [address]    Report Status 02/08/2019 FINAL  Final  Aerobic/Anaerobic Culture (surgical/deep wound)     Status: Abnormal   Collection Time: 02/03/19 10:22 AM   Specimen: Abscess  Result Value Ref Range Status   Specimen Description ABSCESS  Final   Special Requests Immunocompromised  Final   Gram Stain   Final    ABUNDANT WBC PRESENT, PREDOMINANTLY PMN ABUNDANT GRAM NEGATIVE RODS FEW GRAM POSITIVE COCCI IN CLUSTERS    Culture (A)  Final    MULTIPLE ORGANISMS PRESENT, NONE PREDOMINANT NO ANAEROBES ISOLATED Performed at [facility], [address], [place], [place] [address]     Report Status 02/08/2019 FINAL  Final  Surgical pcr screen     Status: None   Collection Time: 02/07/19  6:43 AM   Specimen: Nasal Mucosa; Nasal Swab  Result Value Ref Range Status   MRSA, PCR NEGATIVE NEGATIVE Final   Staphylococcus aureus NEGATIVE NEGATIVE Final    Comment: (NOTE) The Xpert SA Assay (FDA approved for NASAL specimens in patients 29 years of age and older), is one component of a comprehensive surveillance program. It is not intended to diagnose infection nor to guide or monitor treatment. Performed at [facility], [place] [address], [place], [place] [address]     Labs: Results for orders placed or performed during the hospital encounter of 01/26/19 (from the past 48 hour(s))  Type and screen [place]     Status: None   Collection Time: 02/09/19  10:29 AM  Result Value Ref Range   ABO/RH(D) O POS    Antibody Screen NEG    Sample Expiration      02/12/2019,2359 Performed at [facility], [place] [address] [place], [place] [address]   ABO/Rh     Status: None   Collection Time: 02/09/19 10:29 AM  Result Value Ref Range   ABO/RH(D)      O POS Performed at [facility] [address], [place], [place] [address]   CBC     Status: Abnormal   Collection Time: 02/10/19  4:45 AM  Result Value Ref Range   WBC 21.1 (H) 4.0 - 10.5 K/uL   RBC 4.51 3.87 - 5.11 MIL/uL   Hemoglobin 12.6 12.0 - 15.0 g/dL   HCT 38.4 36.0 - 46.0 %   MCV 85.1 80.0 - 100.0 fL   MCH 27.9 26.0 - 34.0 pg   MCHC 32.8 30.0 - 36.0 g/dL   RDW 13.9 11.5 - 15.5 %   Platelets 387 150 - 400 K/uL   nRBC 0.0 0.0 - 0.2 %    Comment: Performed at [facility], [place] [address], [place], [place] [address]  Basic metabolic panel     Status: Abnormal   Collection Time: 02/10/19  4:45 AM  Result Value Ref Range   Sodium 131 (L) 135 - 145 mmol/L   Potassium 4.7 3.5 - 5.1 mmol/L    Comment: DELTA CHECK NOTED   Chloride 97 (L) 98 - 111 mmol/L   CO2 24 22 - 32 mmol/L    Glucose, Bld 128 (H) 70 - 99 mg/dL   BUN 8 6 - 20 mg/dL   Creatinine, Ser 0.56 0.44 - 1.00 mg/dL   Calcium 8.2 (L) 8.9 - 10.3 mg/dL   GFR calc non Af Amer >60 >60 mL/min   GFR calc Af Amer >60 >60 mL/min   Anion gap 10 5 - 15    Comment: Performed at [facility], [place] [address], [place], [place] [address]  Magnesium     Status: None   Collection Time: 02/10/19  4:45 AM  Result Value Ref Range   Magnesium 2.3 1.7 - 2.4 mg/dL    Comment: Performed at [facility] [address], [place], [place] [address]  CBC     Status: Abnormal   Collection Time: 02/11/19  3:39 AM  Result Value Ref Range   WBC 19.3 (H) 4.0 - 10.5 K/uL   RBC 3.57 (L) 3.87 - 5.11 MIL/uL   Hemoglobin 10.0 (L) 12.0 - 15.0 g/dL    Comment: REPEATED TO VERIFY DELTA CHECK NOTED    HCT 31.0 (L) 36.0 - 46.0 %   MCV 86.8 80.0 - 100.0 fL   MCH 28.0 26.0 - 34.0 pg   MCHC 32.3 30.0 - 36.0 g/dL   RDW 14.1 11.5 - 15.5 %   Platelets 346 150 - 400 K/uL   nRBC 0.0 0.0 - 0.2 %    Comment: Performed at [facility], [place] [address] [place], [place] [address]  Basic metabolic panel     Status: Abnormal   Collection Time: 02/11/19  3:39 AM  Result Value Ref Range   Sodium 133 (L) 135 - 145 mmol/L   Potassium 4.9 3.5 - 5.1 mmol/L   Chloride 99 98 - 111 mmol/L   CO2 27 22 - 32 mmol/L   Glucose, Bld 104 (H) 70 - 99 mg/dL   BUN 9  6 - 20 mg/dL   Creatinine, Ser 0.52 0.44 - 1.00 mg/dL   Calcium 8.2 (L) 8.9 - 10.3 mg/dL   GFR calc non Af Amer >60 >60 mL/min   GFR calc Af Amer >60 >60 mL/min   Anion gap 7 5 - 15    Comment: Performed at [facility] [address] [place], [place] [address]  Magnesium     Status: None   Collection Time: 02/11/19  3:39 AM  Result Value Ref Range   Magnesium 2.1 1.7 - 2.4 mg/dL    Comment: Performed at [facility] [address] [place], [place] [address]    Imaging / Studies: No results found.  Medications / Allergies: per chart  Antibiotics:  Anti-infectives (From admission, onward)   Start     Dose/Rate Route Frequency Ordered Stop   02/02/19 1415  fluconazole (DIFLUCAN) IVPB 400 mg     400 mg 100 mL/hr over 120 Minutes Intravenous Every 24 hours 02/02/19 1408 02/14/19 0822   01/30/19 1200  piperacillin-tazobactam (ZOSYN) IVPB 3.375 g     3.375 g 12.5 mL/hr over 240 Minutes Intravenous Every 8 hours 01/30/19 1045 02/14/19 0807   01/27/19 1015  ciprofloxacin (CIPRO) IVPB 400 mg  Status:  Discontinued     400 mg 200 mL/hr over 60 Minutes Intravenous Every 12 hours 01/27/19 0117 01/30/19 1039   01/27/19 0700  metroNIDAZOLE (FLAGYL) IVPB 500 mg  Status:  Discontinued     500 mg 100 mL/hr over 60 Minutes Intravenous Every 8 hours 01/27/19 0117 01/30/19 1039   01/26/19 2245  ciprofloxacin (CIPRO) IVPB 400 mg     400 mg 200 mL/hr over 60 Minutes Intravenous  Once 01/26/19 2232 01/27/19 0026   01/26/19 2245  metroNIDAZOLE (FLAGYL) IVPB 500 mg     500 mg 100 mL/hr over 60 Minutes Intravenous  Once 01/26/19 2232 01/27/19 0010        Note: Portions of this report may have been transcribed using voice recognition software. Every effort was made to ensure accuracy; however, inadvertent computerized transcription errors may be present.   Any transcriptional errors that result from this process are unintentional.     Adin Hector, MD, FACS, MASCRS Gastrointestinal and Minimally Invasive Surgery    [address], [place], [place] [address] [phone] Main / Paging [phone] Fax

## 2019-02-11 NOTE — Progress Notes (Signed)
Pharmacy Antibiotic Note  Heather Lam is a 58 y.o. female admitted on 01/26/2019 with sigmoid diverticulitis with abscess . Abscess is growing pan-S e.coli, strep viridans, and candida albicans. Cultures from abscess on 7/5 still growing GNR and Strep. D/W MD 7/6, due to persistently positive cultures will continue current abx for now.   7/10 [facility] Procedure with Colostomy. WBC 17.2 > 21.1 > 19.3, drains remain, wound VAC  Plan: D17 abx No change Zosyn 3.375g IV every 8 hours No change Fluconazole 400mg  IV every 24 hours  Height: 5' 2.99" (160 cm) Weight: 198 lb 3.1 oz (89.9 kg) IBW/kg (Calculated) : 52.38  Temp (24hrs), Avg:97.9 F (36.6 C), Min:97.6 F (36.4 C), Max:98.2 F (36.8 C)  Recent Labs  Lab 02/07/19 0346 02/08/19 0339 02/09/19 0503 02/10/19 0445 02/11/19 0339  WBC 14.1* 19.4* 17.2* 21.1* 19.3*  CREATININE 0.62 0.60 0.57 0.56 0.52    Estimated Creatinine Clearance: 81.6 mL/min (by C-G formula based on SCr of 0.52 mg/dL).    No Known Allergies  Antimicrobials this admission:  6/26 Cipro >> 6/30 6/26 Flagyl >> 6/30 6/30 Zosyn >>  7/4 Fluconazole >>  Dose adjustments this admission:  --  Microbiology results:  6/26 COVID: negative  6/29 abscess, JP drainage: E.coli (pan-sensitive), Strep viridans, Candida albicans, no anaerobes isolated   7/4 pelvic abscess: rare GPC, no growth-final  7/5: abscess cultures: multiple organisms, no anaerobes  Heather Lam PharmD [phone] Please check AMION for all [place] numbers 02/11/2019 11:59 AM

## 2019-02-11 NOTE — Progress Notes (Signed)
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 sae pcp 1 day PTA  For ongoing diarrhea and abdominal pain X5 DAYS-and was diagnosed clinically as diverticulitis and started on Augmentin and Flagyl. Patient says that she did not feel better and in fact her bloating got worse with significant worsening of abdominal pain.   In the ER, CT scan of the abdomen pelvis showed acute complicated sigmoid diverticulitis- 3.8 cm abscess adjacent to sigmoid colon. Small to moderate volume of fluid within the pelvis with questionable rim enhancement.  General surgery  CCS was consulted advised IV antibiotics and IR drainage of the abscess and was admitted.  Patient had low-grade fever and nonbloody diarrhea.   Subjective: Patient seen and examined at bedside, resting comfortably.  Underwent exploratory laparotomy with sigmoid colectomy, end colostomy with lysis of adhesions secondary to bowel perforation with abscess yesterday.  Continues on IV antibiotics.  On clear liquid diet.  Ostomy without output as of yet.  Pain controlled on PCA.  Feels tired this morning.  No other complaints at this time.  Denies headache, no fever/chills/night sweats, no nausea/vomiting/diarrhea, no chest pain, no palpitations, no shortness of breath, no abdominal discomfort, no cough/congestion, no issues with bowel/bladder function, no paresthesias.  No acute events overnight per nursing staff.  Assessment & Plan:  Acute perforated sigmoid diverticulitis with intra-abdominal abscess:  Pt with 4-5 prior episodes of diverticulitis that did not require hospitalization.  Colonoscopy abt 5 yrs diverticulosis. S/P dain placement 6/29 for abscess. WBC was trending up, repeat CT 7/3  persistent acute left lower quadrant sigmoid diverticulitis with 2 pelvic abscesses, left lower quadrant abscess drain was retracted into the pericolonic fat.  S/p replacement of  LLQ drain and new pelvic drain 7/4.  Drain having serosanguineous output.   --General surgery following, appreciate assistance -- WBC count trending up 13.1-->14.1-->19.4-->17.2-->21.1-->19.3 --CT abdomen/pelvis 02/08/2019 with thickening/fat stranding sigmoid colon consistent with acute diverticulitis complicated by perforation/abscess. --s/p Exploratory laparotomy, lysis of inflammatory adhesions, sigmoid colectomy,end colostomy;placement of incisional wound VAC on 7/10 --Continues on Zosyn for empiric antimicrobial coverage; surgery plans 5-day postop course --Continue wound/ostomy care --Clear liquid diet --further per general surgery  Chronic diastolic CHF:  Compensated. Pt retaining fluid but no shortness of breath.  --Continue HCTZ   Swelling of the right upper extremity/and right lower leg.   Swelling is improving-cont HCTZ.  U/S duplex negative for DVT.     Anxiety/depression  Continue home Xanax, buspirone, Cymbalta and trazodone.    Essential hypertension: BP fairly controlled on Metoprolol, HCTZ losartan  CAD with prior stent: Cont asa.  Prediabetes: A1c 5.8.  We will advise her to follow-up with PCP, for now continue on diet control.  HLD:  on statins  OSA: On Nocturnal CPAP but patient has been refusing.    Hypokalemia:  Potassium 4.9 this morning --Discontinued scheduled potassium on 02/10/2019   DVT prophylaxis:   Lovenox  Code Status: Full Code Family Communication:  none Disposition Plan: Remains inpatient pending clinical improvement.  PT recommends home health PT on discharge   Consultants:  CCS  Procedures:  [place]  Left Abd Drain replacement and new drain ln left gluteal 7/4  Exploratory laparotomy, lysis of inflammatory adhesions, sigmoid  colectomy,end  colostomy;placement of incisional wound VAC 02/09/2019  Antimicrobials: Anti-infectives (From admission, onward)   Start     Dose/Rate Route Frequency Ordered Stop   02/02/19 1415  fluconazole (DIFLUCAN) IVPB 400 mg     400 mg 100 mL/hr over 120 Minutes Intravenous Every 24 hours 02/02/19 1408 02/14/19 0822   01/30/19 1200  piperacillin-tazobactam (ZOSYN) IVPB 3.375 g     3.375 g 12.5 mL/hr over 240 Minutes Intravenous Every 8 hours 01/30/19 1045 02/14/19 0807   01/27/19 1015  ciprofloxacin (CIPRO) IVPB 400 mg  Status:  Discontinued     400 mg 200 mL/hr over 60 Minutes Intravenous Every 12 hours 01/27/19 0117 01/30/19 1039   01/27/19 0700  metroNIDAZOLE (FLAGYL) IVPB 500 mg  Status:  Discontinued     500 mg 100 mL/hr over 60 Minutes Intravenous Every 8 hours 01/27/19 0117 01/30/19 1039   01/26/19 2245  ciprofloxacin (CIPRO) IVPB 400 mg     400 mg 200 mL/hr over 60 Minutes Intravenous  Once 01/26/19 2232 01/27/19 0026   01/26/19 2245  metroNIDAZOLE (FLAGYL) IVPB 500 mg     500 mg 100 mL/hr over 60 Minutes Intravenous  Once 01/26/19 2232 01/27/19 0010       Objective: Vitals:   02/11/19 0531 02/11/19 0814 02/11/19 1038 02/11/19 1214  BP: 127/70  124/62   Pulse: 65  62   Resp: 13 10 14 15   Temp: 97.9 F (36.6 C)  97.6 F (36.4 C)   TempSrc: Oral  Oral   SpO2: 98% 96% 98% 95%  Weight:      Height:        Intake/Output Summary (Last 24 hours) at 02/11/2019 1330 Last data filed at 02/11/2019 1038 Gross per 24 hour  Intake 2098.23 ml  Output 2060 ml  Net 38.23 ml   Filed Weights   02/09/19 0641 02/09/19 1021 02/10/19 0500  Weight: 89.9 kg 89.9 kg 89.9 kg   Weight change:   Body mass index is 35.12 kg/m.  Intake/Output from previous day: 07/11 0701 - 07/12 0700 In: 2308.2 [P.O.:360; I.V.:1656.7; IV Piggyback:291.5] Out: 2060 [Urine:1950; Drains:110] Intake/Output this shift: Total I/O In: 150 [P.O.:150] Out: -   Examination:  General exam:  Calm, comfortable, not in acute distress, fatigued HEENT:Oral mucosa moist, Ear/Nose WNL grossly, dentition normal. Respiratory system: Bilateral equal air entry, no crackles and wheezing, no use of accessory muscle, non tender on palpation. Cardiovascular system: regular rate and rhythm, S1 & S2 heard, No JVD/murmurs. Gastrointestinal system: Abdomen soft, mild generalized tenderness, ostomy site noted no stool present, wound VAC site noted, bowel sounds present Nervous System: Alert, awake and oriented at baseline. Non focal Extremities: No edema, distal peripheral pulses palpable.  Skin: No rashes,no icterus. MSK: Normal muscle bulk,tone, power  Medications:  Scheduled Meds: . acetaminophen  1,000 mg Oral TID  . aspirin EC  81 mg Oral Daily  . busPIRone  15 mg Oral TID  . DULoxetine  60 mg Oral Daily  . enoxaparin (LOVENOX) injection  40 mg Subcutaneous Q24H  . feeding supplement  1 Container Oral TID BM  . gabapentin  300 mg Oral QHS  . guaiFENesin  600 mg Oral BID  . losartan  100 mg Oral Daily   And  . hydrochlorothiazide  25 mg Oral Daily  . HYDROmorphone   Intravenous Q4H  . lip balm  1 application Topical BID  . metoprolol tartrate  25 mg Oral BID  . rosuvastatin  10 mg Oral Daily   Continuous Infusions: .  0.45 % NaCl with KCl 20 mEq / L 100 mL/hr at 02/11/19 0955  . fluconazole (DIFLUCAN) IV 400 mg (02/11/19 0957)  . lactated ringers    . lactated ringers 50 mL/hr at 02/10/19 0944  . methocarbamol (ROBAXIN) IV    . piperacillin-tazobactam (ZOSYN)  IV 3.375 g (02/11/19 1211)    Data Reviewed: I have personally reviewed following labs and imaging studies  CBC: Recent Labs  Lab 02/07/19 0346 02/08/19 0339 02/09/19 0503 02/10/19 0445 02/11/19 0339  WBC 14.1* 19.4* 17.2* 21.1* 19.3*  HGB 12.6 12.6 12.1 12.6 10.0*  HCT 38.1 37.9 37.2 38.4 31.0*  MCV 83.9 83.8 84.5 85.1 86.8  PLT 366 358 329 387 294   Basic Metabolic Panel: Recent Labs  Lab 02/07/19 0346  02/08/19 0339 02/09/19 0503 02/10/19 0445 02/11/19 0339  NA 136 132* 135 131* 133*  K 4.6 2.8* 3.5 4.7 4.9  CL 98 96* 100 97* 99  CO2 26 26 24 24 27   GLUCOSE 92 139* 107* 128* 104*  BUN <5* <5* 6 8 9   CREATININE 0.62 0.60 0.57 0.56 0.52  CALCIUM 8.9 8.5* 8.4* 8.2* 8.2*  MG  --   --  1.5* 2.3 2.1   GFR: Estimated Creatinine Clearance: 81.6 mL/min (by C-G formula based on SCr of 0.52 mg/dL). Liver Function Tests: No results for input(s): AST, ALT, ALKPHOS, BILITOT, PROT, ALBUMIN in the last 168 hours. No results for input(s): LIPASE, AMYLASE in the last 168 hours. No results for input(s): AMMONIA in the last 168 hours. Coagulation Profile: No results for input(s): INR, PROTIME in the last 168 hours. Cardiac Enzymes: No results for input(s): CKTOTAL, CKMB, CKMBINDEX, TROPONINI in the last 168 hours. BNP (last 3 results) No results for input(s): PROBNP in the last 8760 hours. HbA1C: No results for input(s): HGBA1C in the last 72 hours. CBG: No results for input(s): GLUCAP in the last 168 hours. Lipid Profile: No results for input(s): CHOL, HDL, LDLCALC, TRIG, CHOLHDL, LDLDIRECT in the last 72 hours. Thyroid Function Tests: No results for input(s): TSH, T4TOTAL, FREET4, T3FREE, THYROIDAB in the last 72 hours. Anemia Panel: No results for input(s): VITAMINB12, FOLATE, FERRITIN, TIBC, IRON, RETICCTPCT in the last 72 hours. Sepsis Labs: No results for input(s): PROCALCITON, LATICACIDVEN in the last 168 hours.  Recent Results (from the past 240 hour(s))  Aerobic/Anaerobic Culture (surgical/deep wound)     Status: None   Collection Time: 02/03/19 10:21 AM   Specimen: Pelvis; Abscess  Result Value Ref Range Status   Specimen Description PELVIS  Final   Special Requests Immunocompromised  Final   Gram Stain   Final    ABUNDANT WBC PRESENT, PREDOMINANTLY PMN RARE GRAM POSITIVE COCCI IN PAIRS IN CHAINS    Culture   Final    No growth aerobically or anaerobically. Performed at  [facility], [place] [address], [place], [place] [address]    Report Status 02/08/2019 FINAL  Final  Aerobic/Anaerobic Culture (surgical/deep wound)     Status: Abnormal   Collection Time: 02/03/19 10:22 AM   Specimen: Abscess  Result Value Ref Range Status   Specimen Description ABSCESS  Final   Special Requests Immunocompromised  Final   Gram Stain   Final    ABUNDANT WBC PRESENT, PREDOMINANTLY PMN ABUNDANT GRAM NEGATIVE RODS FEW GRAM POSITIVE COCCI IN CLUSTERS    Culture (A)  Final    MULTIPLE ORGANISMS PRESENT, NONE PREDOMINANT NO ANAEROBES ISOLATED Performed at [facility], [address], [place], [place]  [address]    Report Status 02/08/2019 FINAL  Final  Surgical pcr screen     Status: None   Collection Time: 02/07/19  6:43 AM   Specimen: Nasal Mucosa; Nasal Swab  Result Value Ref Range Status   MRSA, PCR NEGATIVE NEGATIVE Final   Staphylococcus aureus NEGATIVE NEGATIVE Final    Comment: (NOTE) The Xpert SA Assay (FDA approved for NASAL specimens in patients 90 years of age and older), is one component of a comprehensive surveillance program. It is not intended to diagnose infection nor to guide or monitor treatment. Performed at [facility], [place] [address], [place], [place] [address]       Radiology Studies: No results found.    LOS: 15 days   Time spent: 32 minutes. More than 50% of that time was spent in counseling and/or coordination of care.  Katniss Weedman J [place] DO [facility]  02/11/2019, 1:30 PM

## 2019-02-11 NOTE — Evaluation (Signed)
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 She presented to the ER with lower abdominal pain. She was admitted 01/27/19 with dx of sigmoid diverticulitis with 3.8 cm abscess. She underwent open sigmoid colectomy with end colostomy 02/09/19.    Clinical Impression  Pt admitted with above diagnosis. Pt currently with functional limitations due to the deficits listed below (see PT Problem List). PTA pt lived alone, independent and was working as an Therapist, sports. On eval, she required min assist bed mobility, min assist transfers, and min guard assist ambulation 100 feet with RW. Pt will benefit from skilled PT to increase their independence and safety with mobility to allow discharge to the venue listed below.       Follow Up Recommendations Home health PT;Supervision for mobility/OOB    Equipment Recommendations  None recommended by PT    Recommendations for Other Services       Precautions / Restrictions Precautions Precautions: Other (comment) Precaution Comments: wound vac, drain, colostomy      Mobility  Bed Mobility Overal bed mobility: Needs Assistance Bed Mobility: Supine to Sit     Supine to sit: Min assist;HOB elevated     General bed mobility comments: +rail, increased time and effort  Transfers Overall transfer level: Needs assistance Equipment used: Rolling walker (2 wheeled) Transfers: Sit to/from Stand Sit to Stand: Min assist         General transfer comment: assist to power up  Ambulation/Gait Ambulation/Gait assistance: Min guard Gait Distance (Feet): 125 Feet Assistive device: Rolling walker (2 wheeled) Gait Pattern/deviations: Step-through pattern;Decreased stride length Gait velocity: decreased Gait velocity  interpretation: 1.31 - 2.62 ft/sec, indicative of limited community ambulator General Gait Details: steady gait. Min guard for safety and line management. Pt ambulated on RA with SpO2 > 90%.  Stairs            Wheelchair Mobility    Modified Rankin (Stroke Patients Only)       Balance Overall balance assessment: Needs assistance Sitting-balance support: No upper extremity supported;Feet supported Sitting balance-Leahy Scale: Good     Standing balance support: Bilateral upper extremity supported;During functional activity Standing balance-Leahy Scale: Fair Standing balance comment: RW needed for amb                             Pertinent Vitals/Pain Pain Assessment: Faces Faces Pain Scale: Hurts whole lot Pain Location: abdomen during transfers Pain Descriptors / Indicators: Guarding;Grimacing;Moaning Pain Intervention(s): PCA encouraged;Repositioned    Home Living Family/patient expects to be discharged to:: Private residence Living Arrangements: Children;Parent Available Help at Discharge: Family;Available 24 hours/day Type of Home: House Home Access: Ramped entrance     Home Layout: One level Home Equipment: Walker - 2 wheels;Shower seat Additional Comments: Pt plans to d/c to her mother's house.    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     Hand Dominance        Extremity/Trunk Assessment   Upper Extremity Assessment Upper Extremity Assessment: Overall WFL for tasks assessed    Lower Extremity Assessment Lower Extremity Assessment: Generalized weakness    Cervical / Trunk  Assessment Cervical / Trunk Assessment: Normal  Communication   Communication: No difficulties  Cognition Arousal/Alertness: Awake/alert Behavior During Therapy: WFL for tasks assessed/performed Overall Cognitive Status: Within Functional Limits for tasks assessed                                         General Comments      Exercises     Assessment/Plan    PT Assessment Patient needs continued PT services  PT Problem List Decreased strength;Decreased mobility;Decreased activity tolerance;Pain;Decreased balance;Decreased knowledge of use of DME       PT Treatment Interventions DME instruction;Therapeutic activities;Gait training;Therapeutic exercise;Patient/family education;Stair training;Balance training;Functional mobility training    PT Goals (Current goals can be found in the Care Plan section)  Acute Rehab PT Goals Patient Stated Goal: feel better PT Goal Formulation: With patient Time For Goal Achievement: 02/25/19 Potential to Achieve Goals: Good    Frequency Min 3X/week   Barriers to discharge        Co-evaluation               AM-PAC PT "6 Clicks" Mobility  Outcome Measure Help needed turning from your back to your side while in a flat bed without using bedrails?: A Little Help needed moving from lying on your back to sitting on the side of a flat bed without using bedrails?: A Little Help needed moving to and from a bed to a chair (including a wheelchair)?: A Little Help needed standing up from a chair using your arms (e.g., wheelchair or bedside chair)?: A Little Help needed to walk in hospital room?: A Little Help needed climbing 3-5 steps with a railing? : A Little 6 Click Score: 18    End of Session Equipment Utilized During Treatment: Gait belt Activity Tolerance: Patient tolerated treatment well Patient left: in chair;with call bell/phone within reach Nurse Communication: Mobility status PT Visit Diagnosis: Other abnormalities of gait and mobility (R26.89);Pain;Muscle weakness (generalized) (M62.81)    Time: 1610-9604 PT Time Calculation (min) (ACUTE ONLY): 25 min   Charges:   PT Evaluation $PT Eval Moderate Complexity: 1 Mod PT Treatments $Gait Training: 8-22 mins        Lorrin Goodell, PT  Office # [phone] Pager [phone]   Lorriane Shire 02/11/2019, 11:58 AM

## 2019-02-12 ENCOUNTER — Encounter (HOSPITAL_COMMUNITY): Payer: Self-pay | Admitting: Surgery

## 2019-02-12 LAB — BASIC METABOLIC PANEL
Anion gap: 7 (ref 5–15)
BUN: <5 mg/dL — ABNORMAL LOW (ref 6–20)
CO2: 29 mmol/L (ref 22–32)
Calcium: 8.5 mg/dL — ABNORMAL LOW (ref 8.9–10.3)
Chloride: 101 mmol/L (ref 98–111)
Creatinine, Ser: 0.49 mg/dL (ref 0.44–1.00)
GFR calc Af Amer: >60 mL/min (ref >60)
GFR calc non Af Amer: >60 mL/min (ref >60)
Glucose, Bld: 91 mg/dL (ref 70–99)
Potassium: 4.5 mmol/L (ref 3.5–5.1)
Sodium: 137 mmol/L (ref 135–145)

## 2019-02-12 LAB — CBC
HCT: 30.1 % — ABNORMAL LOW (ref 36.0–46.0)
Hemoglobin: 9.3 g/dL — ABNORMAL LOW (ref 12.0–15.0)
MCH: 27.4 pg (ref 26.0–34.0)
MCHC: 30.9 g/dL (ref 30.0–36.0)
MCV: 88.8 fL (ref 80.0–100.0)
Platelets: 354 10*3/uL (ref 150–400)
RBC: 3.39 MIL/uL — ABNORMAL LOW (ref 3.87–5.11)
RDW: 14.3 % (ref 11.5–15.5)
WBC: 11 10*3/uL — ABNORMAL HIGH (ref 4.0–10.5)
nRBC: 0 % (ref 0.0–0.2)

## 2019-02-12 MED ORDER — OXYCODONE HCL 5 MG PO TABS
5.0000 mg | ORAL_TABLET | ORAL | Status: DC | PRN
  Administered 2019-02-12: 10 mg via ORAL
  Administered 2019-02-14: 5 mg via ORAL
  Filled 2019-02-12: qty 1
  Filled 2019-02-12: qty 2
  Filled 2019-02-12: qty 1

## 2019-02-12 MED ORDER — PRO-STAT SUGAR FREE PO LIQD
30.0000 mL | Freq: Two times a day (BID) | ORAL | Status: DC
  Administered 2019-02-12 – 2019-02-13 (×3): 30 mL via ORAL
  Filled 2019-02-12 (×5): qty 30

## 2019-02-12 MED ORDER — HYDROMORPHONE HCL 1 MG/ML IJ SOLN
1.0000 mg | INTRAMUSCULAR | Status: DC | PRN
  Administered 2019-02-12: 1 mg via INTRAVENOUS
  Filled 2019-02-12: qty 1

## 2019-02-12 MED ORDER — ENSURE ENLIVE PO LIQD
237.0000 mL | Freq: Two times a day (BID) | ORAL | Status: DC
  Administered 2019-02-12 – 2019-02-13 (×3): 237 mL via ORAL

## 2019-02-12 NOTE — Progress Notes (Signed)
Wasted remaining Dilaudid from [place] with Baxter Flattery, RN in [employer].

## 2019-02-12 NOTE — Consult Note (Signed)
[place] Nurse wound consult note Reason for Consult:Midline surgical wound with NPWT (VAC) dressing in place. Change M/W/F Wound type:Surgical Pressure Injury POA: NA Measurement:17 cm x 5 cmx 4 cm  Wound KDT:OIZTIWP blue sutures in wound bed. Bleeds with cleansing.  Drainage (amount, consistency, odor) minimal bleeding Periwound:LLQ colostomy RLQ drain Dressing procedure/placement/frequency: Cleanse midline abdominal wound with NS.  Fill with 1 piece black foam.  Cover with drape. Seal immediately achieved. Change M/W/F WOC Nurse ostomy consult note Stoma type/location: LLQ colostomy.  NO output only blood tinged liquid at this time.  Stomal assessment/size: 1 1/4", slightly oval  Peristomal assessment: Creasing at 3 and 9 o'clock.   Treatment options for stomal/peristomal skin: barrier ring and two piece. May switch to 1 piece convex  Will assess at next pouch change Output blood tinged liquid only Ostomy pouching: 2pc. 2 1/4" pouch with barrier ring  Education provided: Pouch change performed.  Daughter to meet [place] Wednesday at 10 AM for next session.  Discussed twice weekly changes, emptying when 1/3 full. Showering and activity level  Enrolled patient in [place] program: No WOc team will follow.  Domenic Moras MSN, RN, FNP-BC CWON Wound, Ostomy, Continence Nurse Pager [phone]

## 2019-02-12 NOTE — Progress Notes (Signed)
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 sae pcp 1 day PTA  For ongoing diarrhea and abdominal pain X5 DAYS-and was diagnosed clinically as diverticulitis and started on Augmentin and Flagyl. Patient says that she did not feel better and in fact her bloating got worse with significant worsening of abdominal pain.   In the ER, CT scan of the abdomen pelvis showed acute complicated sigmoid diverticulitis- 3.8 cm abscess adjacent to sigmoid colon. Small to moderate volume of fluid within the pelvis with questionable rim enhancement.  General surgery  CCS was consulted advised IV antibiotics and IR drainage of the abscess and was admitted.  Patient had low-grade fever and nonbloody diarrhea.   Subjective: Patient seen and examined at bedside, resting comfortably.  Continues with some abdominal pain, but tolerable.  No significant ostomy output, but now with gas in bag. No other complaints at this time.  Surgery advancing to a soft diet today.  Denies headache, no fever/chills/night sweats, no nausea/vomiting/diarrhea, no chest pain, no palpitations, no shortness of breath, no abdominal discomfort, no cough/congestion, no issues with bowel/bladder function, no paresthesias.  No acute events overnight per nursing staff.  Assessment & Plan:  Acute perforated sigmoid diverticulitis with intra-abdominal abscess:  Pt with 4-5 prior episodes of diverticulitis that did not require hospitalization.  Colonoscopy abt 5 yrs diverticulosis. S/P dain placement 6/29 for abscess. WBC was trending up, repeat CT 7/3 persistent acute left lower quadrant sigmoid diverticulitis with 2 pelvic abscesses, left lower quadrant abscess drain was retracted into the  pericolonic fat.  S/p replacement of  LLQ drain and new pelvic drain 7/4.  Drain having serosanguineous output.   --General surgery following, appreciate assistance -- WBC count trending up 13.1-->14.1-->19.4-->17.2-->21.1-->19.3-->11.0 --CT abdomen/pelvis 02/08/2019 with thickening/fat stranding sigmoid colon consistent with acute diverticulitis complicated by perforation/abscess. --s/p Exploratory laparotomy, lysis of inflammatory adhesions, sigmoid colectomy,end colostomy;placement of incisional wound VAC on 7/10 --Continues on Zosyn for empiric antimicrobial coverage; surgery plans 5-day postop course --Continue wound/ostomy care --Advance to soft diet today by surgery --further per general surgery  Chronic diastolic CHF:  Compensated.  --Continue HCTZ   Swelling of the right upper extremity/and right lower leg.   U/S duplex negative for DVT.  ---cont HCTZ.    Anxiety/depression  Continue home Xanax, buspirone, Cymbalta and trazodone.    Essential hypertension: BP fairly controlled on Metoprolol, HCTZ losartan  CAD with prior stent: Cont asa.  Prediabetes: A1c 5.8.  We will advise her to follow-up with PCP, for now continue on diet control.  HLD:  on statins  OSA: On Nocturnal CPAP but patient has been refusing.    Hypokalemia:  Potassium 4.5 this morning --Discontinued scheduled potassium on 02/10/2019   DVT prophylaxis:   Lovenox  Code Status: Full Code Family Communication:  none Disposition Plan: Remains inpatient pending clinical improvement.  PT recommends home health PT on discharge   Consultants:  CCS  Procedures:  [place]  Left Abd Drain replacement and new drain ln left gluteal 7/4  Exploratory laparotomy, lysis of inflammatory adhesions, sigmoid colectomy,end colostomy;placement of incisional wound VAC 02/09/2019  Antimicrobials: Anti-infectives (From admission, onward)   Start     Dose/Rate Route Frequency Ordered Stop    02/02/19 1415  fluconazole (DIFLUCAN) IVPB 400 mg     400 mg 100 mL/hr over 120 Minutes Intravenous Every 24 hours 02/02/19 1408 02/14/19 0822   01/30/19 1200  piperacillin-tazobactam (ZOSYN) IVPB 3.375 g     3.375 g 12.5 mL/hr over 240 Minutes Intravenous Every 8 hours 01/30/19 1045 02/14/19 0807   01/27/19 1015  ciprofloxacin (CIPRO) IVPB 400 mg  Status:  Discontinued     400 mg 200 mL/hr over 60 Minutes Intravenous Every 12 hours 01/27/19 0117 01/30/19 1039   01/27/19 0700  metroNIDAZOLE (FLAGYL) IVPB 500 mg  Status:  Discontinued     500 mg 100 mL/hr over 60 Minutes Intravenous Every 8 hours 01/27/19 0117 01/30/19 1039   01/26/19 2245  ciprofloxacin (CIPRO) IVPB 400 mg     400 mg 200 mL/hr over 60 Minutes Intravenous  Once 01/26/19 2232 01/27/19 0026   01/26/19 2245  metroNIDAZOLE (FLAGYL) IVPB 500 mg     500 mg 100 mL/hr over 60 Minutes Intravenous  Once 01/26/19 2232 01/27/19 0010       Objective: Vitals:   02/12/19 0142 02/12/19 0650 02/12/19 0928 02/12/19 1445  BP: (!) 116/54 (!) 142/63  (!) 126/57  Pulse: (!) 56 63  66  Resp: 16 16 17    Temp: 97.7 F (36.5 C) 98.4 F (36.9 C)  99 F (37.2 C)  TempSrc: Oral Oral  Oral  SpO2: 99% 99% 100% 93%  Weight:      Height:        Intake/Output Summary (Last 24 hours) at 02/12/2019 1450 Last data filed at 02/12/2019 0904 Gross per 24 hour  Intake 2604.42 ml  Output 1600 ml  Net 1004.42 ml   Filed Weights   02/09/19 0641 02/09/19 1021 02/10/19 0500  Weight: 89.9 kg 89.9 kg 89.9 kg   Weight change:   Body mass index is 35.12 kg/m.  Intake/Output from previous day: 07/12 0701 - 07/13 0700 In: 2931.4 [P.O.:327; I.V.:2378.3; IV Piggyback:226.1] Out: 830 [Urine:800; Drains:30] Intake/Output this shift: Total I/O In: -  Out: 770 [Urine:750; Drains:20]  Examination:  General exam: Calm, comfortable, not in acute distress, fatigued HEENT:Oral mucosa moist, Ear/Nose WNL grossly, dentition normal. Respiratory system:  Bilateral equal air entry, no crackles and wheezing, no use of accessory muscle, non tender on palpation. Cardiovascular system: regular rate and rhythm, S1 & S2 heard, No JVD/murmurs. Gastrointestinal system: Abdomen soft, mild generalized tenderness, ostomy site noted no stool present but gas present, wound VAC site noted, bowel sounds present Nervous System: Alert, awake and oriented at baseline. Non focal Extremities: No edema, distal peripheral pulses palpable.  Skin: No rashes,no icterus. MSK: Normal muscle bulk,tone, power  Medications:  Scheduled Meds: . acetaminophen  1,000 mg Oral TID  . aspirin EC  81 mg Oral Daily  . busPIRone  15 mg Oral TID  . DULoxetine  60 mg Oral Daily  . enoxaparin (LOVENOX) injection  40 mg Subcutaneous Q24H  . feeding supplement (ENSURE ENLIVE)  237 mL Oral BID BM  . feeding supplement (PRO-STAT SUGAR FREE 64)  30 mL Oral BID  . gabapentin  300 mg Oral QHS  . guaiFENesin  600 mg Oral BID  . losartan  100 mg Oral Daily   And  . hydrochlorothiazide  25 mg Oral Daily  . lip balm  1 application Topical BID  . metoprolol tartrate  25 mg Oral BID  . rosuvastatin  10 mg Oral Daily   Continuous Infusions: . fluconazole (DIFLUCAN) IV 400 mg (02/12/19 1049)  .  methocarbamol (ROBAXIN) IV    . piperacillin-tazobactam (ZOSYN)  IV 3.375 g (02/12/19 1132)    Data Reviewed: I have personally reviewed following labs and imaging studies  CBC: Recent Labs  Lab 02/08/19 0339 02/09/19 0503 02/10/19 0445 02/11/19 0339 02/12/19 0406  WBC 19.4* 17.2* 21.1* 19.3* 11.0*  HGB 12.6 12.1 12.6 10.0* 9.3*  HCT 37.9 37.2 38.4 31.0* 30.1*  MCV 83.8 84.5 85.1 86.8 88.8  PLT 358 329 387 346 809   Basic Metabolic Panel: Recent Labs  Lab 02/08/19 0339 02/09/19 0503 02/10/19 0445 02/11/19 0339 02/12/19 0406  NA 132* 135 131* 133* 137  K 2.8* 3.5 4.7 4.9 4.5  CL 96* 100 97* 99 101  CO2 26 24 24 27 29   GLUCOSE 983* 382* 128* 104* 91  BUN <5* 6 8 9  <5*   CREATININE 0.60 0.57 0.56 0.52 0.49  CALCIUM 8.5* 8.4* 8.2* 8.2* 8.5*  MG  --  1.5* 2.3 2.1  --    GFR: Estimated Creatinine Clearance: 81.6 mL/min (by C-G formula based on SCr of 0.49 mg/dL). Liver Function Tests: No results for input(s): AST, ALT, ALKPHOS, BILITOT, PROT, ALBUMIN in the last 168 hours. No results for input(s): LIPASE, AMYLASE in the last 168 hours. No results for input(s): AMMONIA in the last 168 hours. Coagulation Profile: No results for input(s): INR, PROTIME in the last 168 hours. Cardiac Enzymes: No results for input(s): CKTOTAL, CKMB, CKMBINDEX, TROPONINI in the last 168 hours. BNP (last 3 results) No results for input(s): PROBNP in the last 8760 hours. HbA1C: No results for input(s): HGBA1C in the last 72 hours. CBG: No results for input(s): GLUCAP in the last 168 hours. Lipid Profile: No results for input(s): CHOL, HDL, LDLCALC, TRIG, CHOLHDL, LDLDIRECT in the last 72 hours. Thyroid Function Tests: No results for input(s): TSH, T4TOTAL, FREET4, T3FREE, THYROIDAB in the last 72 hours. Anemia Panel: No results for input(s): VITAMINB12, FOLATE, FERRITIN, TIBC, IRON, RETICCTPCT in the last 72 hours. Sepsis Labs: No results for input(s): PROCALCITON, LATICACIDVEN in the last 168 hours.  Recent Results (from the past 240 hour(s))  Aerobic/Anaerobic Culture (surgical/deep wound)     Status: None   Collection Time: 02/03/19 10:21 AM   Specimen: Pelvis; Abscess  Result Value Ref Range Status   Specimen Description PELVIS  Final   Special Requests Immunocompromised  Final   Gram Stain   Final    ABUNDANT WBC PRESENT, PREDOMINANTLY PMN RARE GRAM POSITIVE COCCI IN PAIRS IN CHAINS    Culture   Final    No growth aerobically or anaerobically. Performed at [facility], [place] [address], [place], [place] [address]    Report Status 02/08/2019 FINAL  Final  Aerobic/Anaerobic Culture (surgical/deep wound)     Status: Abnormal   Collection Time: 02/03/19 10:22  AM   Specimen: Abscess  Result Value Ref Range Status   Specimen Description ABSCESS  Final   Special Requests Immunocompromised  Final   Gram Stain   Final    ABUNDANT WBC PRESENT, PREDOMINANTLY PMN ABUNDANT GRAM NEGATIVE RODS FEW GRAM POSITIVE COCCI IN CLUSTERS    Culture (A)  Final    MULTIPLE ORGANISMS PRESENT, NONE PREDOMINANT NO ANAEROBES ISOLATED Performed at [facility], [address], [place], [place] [address]    Report Status 02/08/2019 FINAL  Final  Surgical pcr screen     Status: None   Collection Time: 02/07/19  6:43 AM   Specimen: Nasal Mucosa; Nasal Swab  Result Value Ref Range  Status   MRSA, PCR NEGATIVE NEGATIVE Final   Staphylococcus aureus NEGATIVE NEGATIVE Final    Comment: (NOTE) The Xpert SA Assay (FDA approved for NASAL specimens in patients 64 years of age and older), is one component of a comprehensive surveillance program. It is not intended to diagnose infection nor to guide or monitor treatment. Performed at [facility], [place] [address] [place], [place] [address]       Radiology Studies: No results found.    LOS: 16 days   Time spent: 32 minutes. More than 50% of that time was spent in counseling and/or coordination of care.  Satomi Buda J [place] DO [facility]  02/12/2019, 2:50 PM

## 2019-02-12 NOTE — Progress Notes (Signed)
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 She presented to the ER with lower abdominal pain. She was admitted 01/27/19 with dx of sigmoid diverticulitis with 3.8 cm abscess. She underwent open sigmoid colectomy with end colostomy 02/09/19.    PT Comments    Pt performed gt training and functional mobility with min to min guard assistance.  She remains slow and guarded due to abdominal pain.  Plan for HHPT remains appropriate.  Will continue to progress mobility to tolerance during hospitalization.    Follow Up Recommendations  Home health PT;Supervision for mobility/OOB     Equipment Recommendations  None recommended by PT    Recommendations for Other Services       Precautions / Restrictions Precautions Precautions: Other (comment) Precaution Comments: wound vac, drain, colostomy Restrictions Weight Bearing Restrictions: No    Mobility  Bed Mobility Overal bed mobility: Needs Assistance Bed Mobility: Rolling;Sit to Sidelying;Sidelying to Sit Rolling: Supervision Sidelying to sit: Min assist     Sit to sidelying: Min assist General bed mobility comments: +rail, increased time and effort, assistance for trunk elevation and lift LEs back to bed.  Transfers Overall transfer level: Needs assistance Equipment used: Rolling walker (2 wheeled) Transfers: Sit to/from Stand Sit to Stand: Min guard         General transfer comment: Cues for hand placement to and from seated surface.  Ambulation/Gait Ambulation/Gait assistance: Min guard Gait Distance (Feet): 350 Feet Assistive device: Rolling walker (2 wheeled) Gait Pattern/deviations: Step-through pattern;Decreased stride length;Trunk flexed Gait velocity: decreased   General Gait  Details: Cues for posture and upper trunk control.  pt slow and guarded.   Stairs             Wheelchair Mobility    Modified Rankin (Stroke Patients Only)       Balance Overall balance assessment: Needs assistance Sitting-balance support: No upper extremity supported;Feet supported Sitting balance-Leahy Scale: Good       Standing balance-Leahy Scale: Fair Standing balance comment: RW needed for amb                            Cognition Arousal/Alertness: Awake/alert Behavior During Therapy: WFL for tasks assessed/performed Overall Cognitive Status: Within Functional Limits for tasks assessed                                        Exercises      General Comments        Pertinent Vitals/Pain Pain Assessment: 0-10 Pain Score: 7  Pain Location: abdomen during transfers Pain Descriptors / Indicators: Tightness;Sore;Discomfort;Grimacing Pain Intervention(s): Monitored during session;Repositioned;Patient requesting pain meds-RN notified    Home Living                      Prior Function            PT Goals (current goals can now be found in the care plan section) Acute Rehab PT Goals Patient Stated Goal: feel better Potential to Achieve Goals: Good Progress towards PT goals: Progressing toward goals    Frequency    Min 3X/week      PT  Plan Current plan remains appropriate    Co-evaluation              AM-PAC PT "6 Clicks" Mobility   Outcome Measure  Help needed turning from your back to your side while in a flat bed without using bedrails?: A Little Help needed moving from lying on your back to sitting on the side of a flat bed without using bedrails?: A Little Help needed moving to and from a bed to a chair (including a wheelchair)?: A Little Help needed standing up from a chair using your arms (e.g., wheelchair or bedside chair)?: A Little Help needed to walk in hospital room?: A Little Help needed  climbing 3-5 steps with a railing? : A Little 6 Click Score: 18    End of Session Equipment Utilized During Treatment: Gait belt Activity Tolerance: Patient tolerated treatment well Patient left: in chair;with call bell/phone within reach Nurse Communication: Mobility status PT Visit Diagnosis: Other abnormalities of gait and mobility (R26.89);Pain;Muscle weakness (generalized) (M62.81)     Time: 1224-8250 PT Time Calculation (min) (ACUTE ONLY): 20 min  Charges:  $Gait Training: 8-22 mins                     Governor Rooks, PTA Acute Rehabilitation Services Pager [phone] Office [phone]     Chrisma Hurlock Eli Hose 02/12/2019, 5:55 PM

## 2019-02-12 NOTE — Progress Notes (Addendum)
[facility]/Trauma Progress Note  3 Days Post-Op   Assessment/Plan Sigmoid diverticulitis with3.8 cmabscess, admission 06/27 -3-4prior episodes of diverticulitis, none that required hospitalization  - Last colonoscopy 2013 by Dr. Hilarie Fredrickson showed mild diverticulosis in the left colon -S/Pdrain placement06/66for the abscess,07/04s/p redrainage due to drain migration -S/P ex lap, LOA, sigmoid colectomy, end colostomy, drain and wound vac, Dr. Kae Heller, 07/10, POD3 - gas in bag, advance diet and dc PCA  ID -cipro/flagyl 6/26-06/30; Zosyn 06/30-07/15WBCdown to 11, afebrile VTE -SCDs, lovenox FEN -FLD Foley -none Follow up -TBD  Plan: FLD, ambulate, dc PCA, PO pain control    LOS: 16 days    Subjective: CC: incisional pain  No issues overnight. Pt is having gas in her bag. She denies nausea, vomiting, fever or chills.   Objective: Vital signs in last 24 hours: Temp:  [97.6 F (36.4 C)-98.4 F (36.9 C)] 98.4 F (36.9 C) (07/13 0650) Pulse Rate:  [56-63] 63 (07/13 0650) Resp:  [14-20] 16 (07/13 0650) BP: (104-142)/(54-78) 142/63 (07/13 0650) SpO2:  [95 %-100 %] 99 % (07/13 0650) Last BM Date: 02/09/19  Intake/Output from previous day: 07/12 0701 - 07/13 0700 In: 2931.4 [P.O.:327; I.V.:2378.3; IV Piggyback:226.1] Out: 7 [Urine:800; Drains:30] Intake/Output this shift: Total I/O In: -  Out: 770 [Urine:750; Drains:20]  PE: Gen:  Alert, NAD, pleasant, cooperative Pulm:  Rate and effort normal Abd: Soft, ND, +BS, midline with vac in place, stoma pink, gas in bag. TTP around incision and of RLQ. No peritonitis  Skin: no rashes noted, warm and dry   Anti-infectives: Anti-infectives (From admission, onward)   Start     Dose/Rate Route Frequency Ordered Stop   02/02/19 1415  fluconazole (DIFLUCAN) IVPB 400 mg     400 mg 100 mL/hr over 120 Minutes Intravenous Every 24 hours 02/02/19 1408 02/14/19 0822   01/30/19 1200   piperacillin-tazobactam (ZOSYN) IVPB 3.375 g     3.375 g 12.5 mL/hr over 240 Minutes Intravenous Every 8 hours 01/30/19 1045 02/14/19 0807   01/27/19 1015  ciprofloxacin (CIPRO) IVPB 400 mg  Status:  Discontinued     400 mg 200 mL/hr over 60 Minutes Intravenous Every 12 hours 01/27/19 0117 01/30/19 1039   01/27/19 0700  metroNIDAZOLE (FLAGYL) IVPB 500 mg  Status:  Discontinued     500 mg 100 mL/hr over 60 Minutes Intravenous Every 8 hours 01/27/19 0117 01/30/19 1039   01/26/19 2245  ciprofloxacin (CIPRO) IVPB 400 mg     400 mg 200 mL/hr over 60 Minutes Intravenous  Once 01/26/19 2232 01/27/19 0026   01/26/19 2245  metroNIDAZOLE (FLAGYL) IVPB 500 mg     500 mg 100 mL/hr over 60 Minutes Intravenous  Once 01/26/19 2232 01/27/19 0010      Lab Results:  Recent Labs    02/11/19 0339 02/12/19 0406  WBC 19.3* 11.0*  HGB 10.0* 9.3*  HCT 31.0* 30.1*  PLT 346 354   BMET Recent Labs    02/11/19 0339 02/12/19 0406  NA 133* 137  K 4.9 4.5  CL 99 101  CO2 27 29  GLUCOSE 104* 91  BUN 9 <5*  CREATININE 0.52 0.49  CALCIUM 8.2* 8.5*   PT/INR No results for input(s): LABPROT, INR in the last 72 hours. CMP     Component Value Date/Time   NA 137 02/12/2019 0406   NA 140 01/25/2019 0924   K 4.5 02/12/2019 0406   CL 101 02/12/2019 0406   CO2 29 02/12/2019 0406   GLUCOSE 91 02/12/2019 0406  BUN <5 (L) 02/12/2019 0406   BUN 8 01/25/2019 0924   CREATININE 0.49 02/12/2019 0406   CALCIUM 8.5 (L) 02/12/2019 0406   PROT 5.2 (L) 01/30/2019 0224   PROT 6.8 01/25/2019 0924   ALBUMIN 2.1 (L) 01/30/2019 0224   ALBUMIN 4.0 01/25/2019 0924   AST 9 (L) 01/30/2019 0224   ALT 8 01/30/2019 0224   ALKPHOS 66 01/30/2019 0224   BILITOT 0.6 01/30/2019 0224   BILITOT 0.4 01/25/2019 0924   GFRNONAA >60 02/12/2019 0406   GFRAA >60 02/12/2019 0406   Lipase     Component Value Date/Time   LIPASE 26 01/26/2019 2032    Studies/Results: No results found.    Kalman Drape , [facility] 02/12/2019, 9:13 AM  Pager: [phone] Mon-Wed, Friday 7:00am-4:30pm Thurs 7am-11:30am  Consults: [phone]

## 2019-02-12 NOTE — Anesthesia Postprocedure Evaluation (Signed)
Anesthesia Post Note  Patient: DANILLE OPPEDISANO  Procedure(s) Performed: HARTMANN PROCEDURE (N/A Abdomen) COLOSTOMY (N/A Abdomen) APPLICATION OF WOUND VAC (N/A Abdomen)     Patient location during evaluation: PACU Anesthesia Type: General Level of consciousness: awake and alert Pain management: pain level controlled Vital Signs Assessment: post-procedure vital signs reviewed and stable Respiratory status: spontaneous breathing, nonlabored ventilation and respiratory function stable Cardiovascular status: blood pressure returned to baseline and stable Postop Assessment: no apparent nausea or vomiting Anesthetic complications: no    Last Vitals:  Vitals:   02/12/19 0142 02/12/19 0650  BP: (!) 116/54 (!) 142/63  Pulse: (!) 56 63  Resp: 16 16  Temp: 36.5 C 36.9 C  SpO2: 99% 99%    Last Pain:  Vitals:   02/12/19 0650  TempSrc: Oral  PainSc:                  Lynda Rainwater

## 2019-02-13 LAB — CBC
HCT: 32.6 % — ABNORMAL LOW (ref 36.0–46.0)
Hemoglobin: 10.1 g/dL — ABNORMAL LOW (ref 12.0–15.0)
MCH: 27.4 pg (ref 26.0–34.0)
MCHC: 31 g/dL (ref 30.0–36.0)
MCV: 88.6 fL (ref 80.0–100.0)
Platelets: 387 10*3/uL (ref 150–400)
RBC: 3.68 MIL/uL — ABNORMAL LOW (ref 3.87–5.11)
RDW: 14.4 % (ref 11.5–15.5)
WBC: 9.1 10*3/uL (ref 4.0–10.5)
nRBC: 0 % (ref 0.0–0.2)

## 2019-02-13 LAB — BASIC METABOLIC PANEL
Anion gap: 9 (ref 5–15)
BUN: 7 mg/dL (ref 6–20)
CO2: 31 mmol/L (ref 22–32)
Calcium: 9 mg/dL (ref 8.9–10.3)
Chloride: 99 mmol/L (ref 98–111)
Creatinine, Ser: 0.5 mg/dL (ref 0.44–1.00)
GFR calc Af Amer: >60 mL/min (ref >60)
GFR calc non Af Amer: >60 mL/min (ref >60)
Glucose, Bld: 96 mg/dL (ref 70–99)
Potassium: 4.4 mmol/L (ref 3.5–5.1)
Sodium: 139 mmol/L (ref 135–145)

## 2019-02-13 MED ORDER — DOCUSATE SODIUM 100 MG PO CAPS
100.0000 mg | ORAL_CAPSULE | Freq: Every day | ORAL | Status: DC
  Administered 2019-02-13 – 2019-02-14 (×2): 100 mg via ORAL
  Filled 2019-02-13 (×2): qty 1

## 2019-02-13 MED ORDER — POLYETHYLENE GLYCOL 3350 17 G PO PACK
17.0000 g | PACK | Freq: Every day | ORAL | Status: DC
  Administered 2019-02-14: 17 g via ORAL
  Filled 2019-02-13 (×2): qty 1

## 2019-02-13 NOTE — Progress Notes (Signed)
[facility]/Trauma Progress Note  4 Days Post-Op   Assessment/Plan Sigmoid diverticulitis with3.8 cmabscess, admission 06/27 -3-4prior episodes of diverticulitis, none that required hospitalization  - Last colonoscopy 2013 by Dr. Hilarie Fredrickson showed mild diverticulosis in the left colon -S/Pdrain placement06/67for the abscess,07/04s/p redrainage due to drain migration -S/P ex lap, LOA, sigmoid colectomy, end colostomy, drain and wound vac, Dr. Kae Heller, 07/10, POD4 - gas in bag, no stool yet  ID -cipro/flagyl 6/26-06/30; Zosyn 06/30-07/15WBCdown to 9.1, afebrile VTE -SCDs, lovenox FEN -soft diet Foley -none Follow up -TBD  Plan: pt daughter is coming tomorrow for ostomy care training. Pt may be ready for discharge tomorrow.    LOS: 17 days    Subjective: CC: incisional pain  No issues overnight. She denies nausea, vomiting, fever or chills. Spoke with Daughter Joellen Jersey on the phone. Pt tolerated soft diet last night. Pain is controlled.    Objective: Vital signs in last 24 hours: Temp:  [98.3 F (36.8 C)-99 F (37.2 C)] 98.6 F (37 C) (07/14 0546) Pulse Rate:  [66-67] 67 (07/14 0546) Resp:  [17-18] 17 (07/14 0546) BP: (126-155)/(57-69) 155/69 (07/14 0546) SpO2:  [93 %-100 %] 94 % (07/14 0546) Last BM Date: 02/09/19  Intake/Output from previous day: 07/13 0701 - 07/14 0700 In: 531.5 [P.O.:60; IV Piggyback:471.5] Out: 790 [Urine:750; Drains:40] Intake/Output this shift: No intake/output data recorded.  PE: Gen:  Alert, NAD, pleasant, cooperative Pulm:  Rate and effort normal Abd: Soft, ND, +BS, midline with vac in place, stoma pink, gas in bag. TTP around incision and of RLQ. No peritonitis  Skin: no rashes noted, warm and dry   Anti-infectives: Anti-infectives (From admission, onward)   Start     Dose/Rate Route Frequency Ordered Stop   02/02/19 1415  fluconazole (DIFLUCAN) IVPB 400 mg     400 mg 100 mL/hr over 120 Minutes  Intravenous Every 24 hours 02/02/19 1408 02/14/19 0822   01/30/19 1200  piperacillin-tazobactam (ZOSYN) IVPB 3.375 g     3.375 g 12.5 mL/hr over 240 Minutes Intravenous Every 8 hours 01/30/19 1045 02/14/19 0807   01/27/19 1015  ciprofloxacin (CIPRO) IVPB 400 mg  Status:  Discontinued     400 mg 200 mL/hr over 60 Minutes Intravenous Every 12 hours 01/27/19 0117 01/30/19 1039   01/27/19 0700  metroNIDAZOLE (FLAGYL) IVPB 500 mg  Status:  Discontinued     500 mg 100 mL/hr over 60 Minutes Intravenous Every 8 hours 01/27/19 0117 01/30/19 1039   01/26/19 2245  ciprofloxacin (CIPRO) IVPB 400 mg     400 mg 200 mL/hr over 60 Minutes Intravenous  Once 01/26/19 2232 01/27/19 0026   01/26/19 2245  metroNIDAZOLE (FLAGYL) IVPB 500 mg     500 mg 100 mL/hr over 60 Minutes Intravenous  Once 01/26/19 2232 01/27/19 0010      Lab Results:  Recent Labs    02/12/19 0406 02/13/19 0349  WBC 11.0* 9.1  HGB 9.3* 10.1*  HCT 30.1* 32.6*  PLT 354 387   BMET Recent Labs    02/12/19 0406 02/13/19 0349  NA 137 139  K 4.5 4.4  CL 101 99  CO2 29 31  GLUCOSE 91 96  BUN <5* 7  CREATININE 0.49 0.50  CALCIUM 8.5* 9.0   PT/INR No results for input(s): LABPROT, INR in the last 72 hours. CMP     Component Value Date/Time   NA 139 02/13/2019 0349   NA 140 01/25/2019 0924   K 4.4 02/13/2019 0349   CL 99 02/13/2019 0349  CO2 31 02/13/2019 0349   GLUCOSE 96 02/13/2019 0349   BUN 7 02/13/2019 0349   BUN 8 01/25/2019 0924   CREATININE 0.50 02/13/2019 0349   CALCIUM 9.0 02/13/2019 0349   PROT 5.2 (L) 01/30/2019 0224   PROT 6.8 01/25/2019 0924   ALBUMIN 2.1 (L) 01/30/2019 0224   ALBUMIN 4.0 01/25/2019 0924   AST 9 (L) 01/30/2019 0224   ALT 8 01/30/2019 0224   ALKPHOS 66 01/30/2019 0224   BILITOT 0.6 01/30/2019 0224   BILITOT 0.4 01/25/2019 0924   GFRNONAA >60 02/13/2019 0349   GFRAA >60 02/13/2019 0349   Lipase     Component Value Date/Time   LIPASE 26 01/26/2019 2032     Studies/Results: No results found.    Kalman Drape , [facility] 02/13/2019, 8:35 AM  Pager: [phone] Mon-Wed, Friday 7:00am-4:30pm Thurs 7am-11:30am  Consults: [phone]

## 2019-02-13 NOTE — Progress Notes (Signed)
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 She presented to the ER with lower abdominal pain. She was admitted 01/27/19 with dx of sigmoid diverticulitis with 3.8 cm abscess. She underwent open sigmoid colectomy with end colostomy 02/09/19.    PT Comments    Pt up for the 2nd time today to ambulate.  She is requiring decreased assistance and able to progress distance.  She did attempt short trial with out device and mildly unsteady.  Will continue to recommend use of the RW for home until balance improves.  Pt resting supine in bed post session.      Follow Up Recommendations  Home health PT;Supervision for mobility/OOB     Equipment Recommendations  None recommended by PT    Recommendations for Other Services       Precautions / Restrictions Precautions Precautions: Other (comment) Precaution Comments: wound vac, drain, colostomy Restrictions Weight Bearing Restrictions: No    Mobility  Bed Mobility Overal bed mobility: Needs Assistance Bed Mobility: Rolling;Sit to Sidelying;Sidelying to Sit Rolling: Supervision Sidelying to sit: Supervision     Sit to sidelying: Supervision General bed mobility comments: Pt able to perform with supervision this session.  She remains guarded due to pain but progressing.  Pt continues to rely heavily on railing.  Transfers Overall transfer level: Needs assistance Equipment used: Rolling walker (2 wheeled) Transfers: Sit to/from Stand Sit to Stand: Supervision         General transfer comment: Mildly unsteady coming to standing with out device.  Once in standing patient reached for PTAs hand for support.  Ambulation/Gait Ambulation/Gait assistance: Min guard;Supervision Gait Distance (Feet): 550  Feet(shorter trial of 10 feet performed witH R HHA and mildly unsteady.) Assistive device: Rolling walker (2 wheeled) Gait Pattern/deviations: Step-through pattern;Decreased stride length;Trunk flexed Gait velocity: decreased   General Gait Details: Cues for posture and upper trunk control.  pt slow and guarded.  Pt lifted hand from RW and presents with minor LOB but able to self correct.  Pt educated to keep hands on RW until she comes to complete stop.   Stairs             Wheelchair Mobility    Modified Rankin (Stroke Patients Only)       Balance Overall balance assessment: Needs assistance Sitting-balance support: No upper extremity supported Sitting balance-Leahy Scale: Good     Standing balance support: Bilateral upper extremity supported;During functional activity Standing balance-Leahy Scale: Fair                              Cognition Arousal/Alertness: Awake/alert Behavior During Therapy: WFL for tasks assessed/performed Overall Cognitive Status: Within Functional Limits for tasks assessed                                        Exercises      General Comments        Pertinent Vitals/Pain Pain Assessment: 0-10 Pain Score: 4  Pain Location: abdomen during transfers Pain Descriptors / Indicators: Tightness;Sore;Discomfort;Grimacing Pain Intervention(s): Monitored during session;Repositioned    Home Living  Prior Function            PT Goals (current goals can now be found in the care plan section) Acute Rehab PT Goals Patient Stated Goal: feel better Potential to Achieve Goals: Good Progress towards PT goals: Progressing toward goals    Frequency    Min 3X/week      PT Plan Current plan remains appropriate    Co-evaluation              AM-PAC PT "6 Clicks" Mobility   Outcome Measure  Help needed turning from your back to your side while in a flat bed without using  bedrails?: A Little Help needed moving from lying on your back to sitting on the side of a flat bed without using bedrails?: A Little Help needed moving to and from a bed to a chair (including a wheelchair)?: A Little Help needed standing up from a chair using your arms (e.g., wheelchair or bedside chair)?: A Little Help needed to walk in hospital room?: A Little Help needed climbing 3-5 steps with a railing? : A Little 6 Click Score: 18    End of Session Equipment Utilized During Treatment: Gait belt Activity Tolerance: Patient tolerated treatment well Patient left: in chair;with call bell/phone within reach Nurse Communication: Mobility status PT Visit Diagnosis: Other abnormalities of gait and mobility (R26.89);Pain;Muscle weakness (generalized) (M62.81)     Time: 6213-0865 PT Time Calculation (min) (ACUTE ONLY): 19 min  Charges:  $Gait Training: 8-22 mins                     Governor Rooks, PTA Acute Rehabilitation Services Pager [phone] Office [phone]     Jena Tegeler Eli Hose 02/13/2019, 5:31 PM

## 2019-02-13 NOTE — Plan of Care (Signed)
  Problem: Clinical Measurements: Goal: Ability to maintain clinical measurements within normal limits will improve Outcome: Progressing   Problem: Coping: Goal: Level of anxiety will decrease Outcome: Progressing   Problem: Elimination: Goal: Will not experience complications related to urinary retention Outcome: Progressing   Problem: Pain Managment: Goal: General experience of comfort will improve Outcome: Progressing   

## 2019-02-13 NOTE — Progress Notes (Signed)
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 sae pcp 1 day PTA  For ongoing diarrhea and abdominal pain X5 DAYS-and was diagnosed clinically as diverticulitis and started on Augmentin and Flagyl. Patient says that she did not feel better and in fact her bloating got worse with significant worsening of abdominal pain.   In the ER, CT scan of the abdomen pelvis showed acute complicated sigmoid diverticulitis- 3.8 cm abscess adjacent to sigmoid colon. Small to moderate volume of fluid within the pelvis with questionable rim enhancement.  General surgery  CCS was consulted advised IV antibiotics and IR drainage of the abscess and was admitted.  Patient had low-grade fever and nonbloody diarrhea.   Subjective: Patient seen and examined at bedside, resting comfortably.  Abdominal pain improved.  No significant ostomy output, but now with gas in bag. No other complaints at this time.  Leukocytosis has resolved, tolerating diet.  Denies headache, no fever/chills/night sweats, no nausea/vomiting/diarrhea, no chest pain, no palpitations, no shortness of breath, no abdominal discomfort, no cough/congestion, no issues with bowel/bladder function, no paresthesias.  No acute events overnight per nursing staff.  Assessment & Plan:  Acute perforated sigmoid diverticulitis with intra-abdominal abscess:  Pt with 4-5 prior episodes of diverticulitis that did not require hospitalization.  Colonoscopy abt 5 yrs diverticulosis. S/P dain placement 6/29 for abscess. WBC was trending up, repeat CT 7/3 persistent acute left lower quadrant sigmoid diverticulitis with 2 pelvic abscesses, left lower quadrant abscess drain was retracted into the pericolonic fat.  S/p  replacement of  LLQ drain and new pelvic drain 7/4.  Despite conservative measures, patient continued with abdominal pain with worsening leukocytosis.  Repeat CT abdomen/pelvis on 02/08/2019 with thickening/fat stranding surrounding the sigmoid colon consistent with acute diverticulitis complicated by perforation and abscess.  Patient underwent exploratory laparotomy, lysis of inflammatory adhesions, sigmoid colectomy with end colostomy and drain and incisional wound VAC placement on 02/09/2019 by Dr. Kae Heller --General surgery following, appreciate assistance -- WBC count13.1-->14.1-->19.4-->17.2-->21.1-->19.3-->11.0-->9.1 --Continues on Zosyn for empiric antimicrobial coverage; surgery plans 5-day postop course End 7/15 --Continue wound/ostomy care --Tolerating soft diet --further per general surgery  Chronic diastolic CHF:  Compensated.  --Continue HCTZ   Swelling of the right upper extremity/and right lower leg.   U/S duplex negative for DVT.  ---cont HCTZ.    Anxiety/depression  Continue home Xanax, buspirone, Cymbalta and trazodone.    Essential hypertension: BP fairly controlled on Metoprolol, HCTZ losartan  CAD with prior stent: Cont asa.  Prediabetes: A1c 5.8.  We will advise her to follow-up with PCP, for now continue on diet control.  HLD:  on statins  OSA: On Nocturnal CPAP but patient has been refusing.    Hypokalemia:  Potassium 4.5 this morning --Discontinued scheduled potassium on 02/10/2019   DVT prophylaxis:   Lovenox  Code Status: Full Code Family Communication:  none Disposition Plan:  Continue inpatient, to complete antibiotics on 02/14/2019, likely discharge home tomorrow per general surgery   Consultants:  CCS  Procedures:  [place]  Left Abd Drain replacement and new drain ln left gluteal 7/4  Exploratory laparotomy, lysis of inflammatory adhesions, sigmoid colectomy,end colostomy;placement of incisional wound VAC 02/09/2019 by  Dr. Kae Heller  Antimicrobials: Anti-infectives (From admission, onward)  Start     Dose/Rate Route Frequency Ordered Stop   02/02/19 1415  fluconazole (DIFLUCAN) IVPB 400 mg     400 mg 100 mL/hr over 120 Minutes Intravenous Every 24 hours 02/02/19 1408 02/14/19 0822   01/30/19 1200  piperacillin-tazobactam (ZOSYN) IVPB 3.375 g     3.375 g 12.5 mL/hr over 240 Minutes Intravenous Every 8 hours 01/30/19 1045 02/14/19 0807   01/27/19 1015  ciprofloxacin (CIPRO) IVPB 400 mg  Status:  Discontinued     400 mg 200 mL/hr over 60 Minutes Intravenous Every 12 hours 01/27/19 0117 01/30/19 1039   01/27/19 0700  metroNIDAZOLE (FLAGYL) IVPB 500 mg  Status:  Discontinued     500 mg 100 mL/hr over 60 Minutes Intravenous Every 8 hours 01/27/19 0117 01/30/19 1039   01/26/19 2245  ciprofloxacin (CIPRO) IVPB 400 mg     400 mg 200 mL/hr over 60 Minutes Intravenous  Once 01/26/19 2232 01/27/19 0026   01/26/19 2245  metroNIDAZOLE (FLAGYL) IVPB 500 mg     500 mg 100 mL/hr over 60 Minutes Intravenous  Once 01/26/19 2232 01/27/19 0010       Objective: Vitals:   02/12/19 1445 02/12/19 2000 02/13/19 0546 02/13/19 1330  BP: (!) 126/57 131/64 (!) 155/69 131/66  Pulse: 66 66 67 (!) 58  Resp:  18 17   Temp: 99 F (37.2 C) 98.3 F (36.8 C) 98.6 F (37 C) 98.6 F (37 C)  TempSrc: Oral Oral Oral Oral  SpO2: 93%  94% 95%  Weight:      Height:        Intake/Output Summary (Last 24 hours) at 02/13/2019 1443 Last data filed at 02/13/2019 1114 Gross per 24 hour  Intake 651.48 ml  Output 420 ml  Net 231.48 ml   Filed Weights   02/09/19 0641 02/09/19 1021 02/10/19 0500  Weight: 89.9 kg 89.9 kg 89.9 kg   Weight change:   Body mass index is 35.12 kg/m.  Intake/Output from previous day: 07/13 0701 - 07/14 0700 In: 531.5 [P.O.:60; IV Piggyback:471.5] Out: 790 [Urine:750; Drains:40] Intake/Output this shift: Total I/O In: 120 [P.O.:120] Out: 400 [Urine:400]  Examination:  General exam: Calm,  comfortable, not in acute distress, fatigued HEENT:Oral mucosa moist, Ear/Nose WNL grossly, dentition normal. Respiratory system: Bilateral equal air entry, no crackles and wheezing, no use of accessory muscle, non tender on palpation. Cardiovascular system: regular rate and rhythm, S1 & S2 heard, No JVD/murmurs. Gastrointestinal system: Abdomen soft, mild generalized tenderness, ostomy site noted no stool present but gas present, drain and wound VAC site noted, bowel sounds present Nervous System: Alert, awake and oriented at baseline. Non focal Extremities: No edema, distal peripheral pulses palpable.  Skin: No rashes,no icterus. MSK: Normal muscle bulk,tone, power  Medications:  Scheduled Meds: . acetaminophen  1,000 mg Oral TID  . aspirin EC  81 mg Oral Daily  . busPIRone  15 mg Oral TID  . docusate sodium  100 mg Oral Daily  . DULoxetine  60 mg Oral Daily  . enoxaparin (LOVENOX) injection  40 mg Subcutaneous Q24H  . feeding supplement (ENSURE ENLIVE)  237 mL Oral BID BM  . feeding supplement (PRO-STAT SUGAR FREE 64)  30 mL Oral BID  . guaiFENesin  600 mg Oral BID  . losartan  100 mg Oral Daily   And  . hydrochlorothiazide  25 mg Oral Daily  . lip balm  1 application Topical BID  . metoprolol tartrate  25 mg Oral BID  . polyethylene glycol  17 g Oral Daily  . rosuvastatin  10 mg Oral Daily   Continuous Infusions: . fluconazole (DIFLUCAN) IV 400 mg (02/13/19 1211)  . methocarbamol (ROBAXIN) IV    . piperacillin-tazobactam (ZOSYN)  IV 3.375 g (02/13/19 1138)    Data Reviewed: I have personally reviewed following labs and imaging studies  CBC: Recent Labs  Lab 02/09/19 0503 02/10/19 0445 02/11/19 0339 02/12/19 0406 02/13/19 0349  WBC 17.2* 21.1* 19.3* 11.0* 9.1  HGB 12.1 12.6 10.0* 9.3* 10.1*  HCT 37.2 38.4 31.0* 30.1* 32.6*  MCV 84.5 85.1 86.8 88.8 88.6  PLT 329 387 346 354 488   Basic Metabolic Panel: Recent Labs  Lab 02/09/19 0503 02/10/19 0445 02/11/19  0339 02/12/19 0406 02/13/19 0349  NA 135 131* 133* 137 139  K 3.5 4.7 4.9 4.5 4.4  CL 100 97* 99 101 99  CO2 24 24 27 29 31   GLUCOSE 107* 128* 104* 91 96  BUN 6 8 9  <5* 7  CREATININE 0.57 0.56 0.52 0.49 0.50  CALCIUM 8.4* 8.2* 8.2* 8.5* 9.0  MG 1.5* 2.3 2.1  --   --    GFR: Estimated Creatinine Clearance: 81.6 mL/min (by C-G formula based on SCr of 0.5 mg/dL). Liver Function Tests: No results for input(s): AST, ALT, ALKPHOS, BILITOT, PROT, ALBUMIN in the last 168 hours. No results for input(s): LIPASE, AMYLASE in the last 168 hours. No results for input(s): AMMONIA in the last 168 hours. Coagulation Profile: No results for input(s): INR, PROTIME in the last 168 hours. Cardiac Enzymes: No results for input(s): CKTOTAL, CKMB, CKMBINDEX, TROPONINI in the last 168 hours. BNP (last 3 results) No results for input(s): PROBNP in the last 8760 hours. HbA1C: No results for input(s): HGBA1C in the last 72 hours. CBG: No results for input(s): GLUCAP in the last 168 hours. Lipid Profile: No results for input(s): CHOL, HDL, LDLCALC, TRIG, CHOLHDL, LDLDIRECT in the last 72 hours. Thyroid Function Tests: No results for input(s): TSH, T4TOTAL, FREET4, T3FREE, THYROIDAB in the last 72 hours. Anemia Panel: No results for input(s): VITAMINB12, FOLATE, FERRITIN, TIBC, IRON, RETICCTPCT in the last 72 hours. Sepsis Labs: No results for input(s): PROCALCITON, LATICACIDVEN in the last 168 hours.  Recent Results (from the past 240 hour(s))  Surgical pcr screen     Status: None   Collection Time: 02/07/19  6:43 AM   Specimen: Nasal Mucosa; Nasal Swab  Result Value Ref Range Status   MRSA, PCR NEGATIVE NEGATIVE Final   Staphylococcus aureus NEGATIVE NEGATIVE Final    Comment: (NOTE) The Xpert SA Assay (FDA approved for NASAL specimens in patients 36 years of age and older), is one component of a comprehensive surveillance program. It is not intended to diagnose infection nor to guide or  monitor treatment. Performed at [facility], [place] [address], [place], [place] [address]       Radiology Studies: No results found.    LOS: 17 days   Time spent: 32 minutes. More than 50% of that time was spent in counseling and/or coordination of care.  Eric J [place] DO [facility]  02/13/2019, 2:43 PM

## 2019-02-13 NOTE — TOC Initial Note (Addendum)
Transition of Care [facility]) - Initial/Assessment Note    Patient Details  Name: Heather Lam MRN: [id number] Date of Birth: July 26, 1961  Transition of Care [facility]) CM/SW Contact:    Marilu Favre, RN Phone Number: 02/13/2019, 10:52 AM  Clinical Narrative:                  Patient from home alone, plans to stay with her mother at discharge at : [address] , [place], [place] [address]   Provided Medicare.gov home health lists for both zip codes. Patient would like an agency that covers both addresses.   Patient choice 1, [place], 2, [facility] , 3 [facility]. Called Butch Penny with [facility] awaiting call back.   Home [facility] explained to patient. Paperwork completed and fax to [facility].   Butch Penny returned call and is unable to accept referral.   1130 spoke with Malachy Mood with [facility] and she is unable to accept.  Called Cory with [facility] he is checking and will return call. Tommi Rumps with [facility] accepted referral.  Patient aware. Expected Discharge Plan: [place] Barriers to Discharge: Continued Medical Work up   Patient Goals and CMS Choice Patient states their goals for this hospitalization and ongoing recovery are:: to go home CMS Medicare.gov Compare Post Acute Care list provided to:: Patient Choice offered to / list presented to : Patient  Expected Discharge Plan and Services Expected Discharge Plan: [place] In-house Referral: Nutrition Discharge Planning Services: CM Consult Post Acute Care Choice: Home Health, Durable Medical Equipment Living arrangements for the past 2 months: Single Family Home                 DME Arranged: Vac DME Agency: [facility] Date DME Agency Contacted: 02/13/19 Time DME Agency Contacted: 3244 Representative spoke with at DME Agency: Leontine Locket [place] Arranged: RN, PT [facility] Agency: [place] ([place]) Date [place]: 02/13/19 Time [place]: 1050 Representative spoke with at [place]:  Butch Penny waiting to hear back if [place] can accept referral  Prior Living Arrangements/Services Living arrangements for the past 2 months: [place] with:: Self Patient language and need for interpreter reviewed:: Yes Do you feel safe going back to the place where you live?: Yes      Need for Family Participation in Patient Care: Yes (Comment) Care giver support system in place?: Yes (comment)   Criminal Activity/Legal Involvement Pertinent to Current Situation/Hospitalization: No - Comment as needed  Activities of Daily Living Home Assistive Devices/Equipment: None ADL Screening (condition at time of admission) Patient's cognitive ability adequate to safely complete daily activities?: Yes Is the patient deaf or have difficulty hearing?: No Does the patient have difficulty seeing, even when wearing glasses/contacts?: No Does the patient have difficulty concentrating, remembering, or making decisions?: No Patient able to express need for assistance with ADLs?: Yes Does the patient have difficulty dressing or bathing?: No Independently performs ADLs?: Yes (appropriate for developmental age) Does the patient have difficulty walking or climbing stairs?: No Weakness of Legs: None Weakness of Arms/Hands: None  Permission Sought/Granted   Permission granted to share information with : Yes, Verbal Permission Granted  Share Information with NAME: Aymee Fomby daughter  Permission granted to share info w AGENCY: [facility], [facility] , [facility]        Emotional Assessment Appearance:: Appears stated age Attitude/Demeanor/Rapport: Engaged   Orientation: : Oriented to Self, Oriented to Place, Oriented to  Time, Oriented to Situation Alcohol / Substance Use: Not Applicable Psych Involvement: No (comment)  Admission diagnosis:  Hypokalemia [E87.6] Hypomagnesemia [E83.42] Intra-abdominal abscess (HCC) [K65.1] Acute diverticulitis [K57.92] Left  lower quadrant abdominal pain [R10.32] Patient Active Problem List   Diagnosis Date Noted  . Diverticulitis 01/27/2019  . Hypokalemia   . Hypomagnesemia   . Intra-abdominal abscess ([place])   . Left lower quadrant abdominal pain   . Actinic keratoses 05/04/2017  . History of MI (myocardial infarction) 04/01/2017  . Class 1 obesity without serious comorbidity with body mass index (BMI) of 34.0 to 34.9 in adult 11/03/2016  . Depression 09/28/2016  . Prediabetes 09/28/2016  . Vitamin D deficiency 08/16/2016  . Recurrent major depressive disorder, in partial remission ([place]) 05/25/2016  . Adjustment disorder 05/25/2016  . Myalgia 05/25/2016  . Carpal tunnel syndrome 02/26/2016  . Obesity (BMI 30-39.9) 02/25/2016  . Insomnia 07/01/2015  . Essential hypertension 05/21/2015  . Generalized anxiety disorder 06/06/2013  . CAD S/P percutaneous coronary angioplasty 09/13/2011  . Antiplatelet or antithrombotic long-term use 09/13/2011  . Acute diverticulitis 09/13/2011  . OSA on CPAP 09/13/2011  . Hyperlipidemia 09/13/2011   PCP:  Terald Sleeper, PA-C Pharmacy:   [place], [place] [address] Phone: [phone] Fax: [phone]  [place], [place] [address] Phone: [phone] Fax: [phone]     Social Determinants of Health (SDOH) Interventions    Readmission Risk Interventions No flowsheet data found.

## 2019-02-14 LAB — BASIC METABOLIC PANEL
Anion gap: 11 (ref 5–15)
BUN: 6 mg/dL (ref 6–20)
CO2: 28 mmol/L (ref 22–32)
Calcium: 9.2 mg/dL (ref 8.9–10.3)
Chloride: 98 mmol/L (ref 98–111)
Creatinine, Ser: 0.51 mg/dL (ref 0.44–1.00)
GFR calc Af Amer: >60 mL/min (ref >60)
GFR calc non Af Amer: >60 mL/min (ref >60)
Glucose, Bld: 102 mg/dL — ABNORMAL HIGH (ref 70–99)
Potassium: 3.7 mmol/L (ref 3.5–5.1)
Sodium: 137 mmol/L (ref 135–145)

## 2019-02-14 LAB — CBC
HCT: 34.1 % — ABNORMAL LOW (ref 36.0–46.0)
Hemoglobin: 10.8 g/dL — ABNORMAL LOW (ref 12.0–15.0)
MCH: 27.6 pg (ref 26.0–34.0)
MCHC: 31.7 g/dL (ref 30.0–36.0)
MCV: 87 fL (ref 80.0–100.0)
Platelets: 398 10*3/uL (ref 150–400)
RBC: 3.92 MIL/uL (ref 3.87–5.11)
RDW: 14.1 % (ref 11.5–15.5)
WBC: 9 10*3/uL (ref 4.0–10.5)
nRBC: 0 % (ref 0.0–0.2)

## 2019-02-14 MED ORDER — AMOXICILLIN-POT CLAVULANATE 875-125 MG PO TABS: 1.0000 | ORAL_TABLET | Freq: Two times a day (BID) | ORAL | 0 refills | Status: AC

## 2019-02-14 MED ORDER — OXYCODONE HCL 5 MG PO TABS: 5.0000 mg | ORAL_TABLET | Freq: Four times a day (QID) | ORAL | 0 refills | Status: AC | PRN

## 2019-02-14 NOTE — Progress Notes (Signed)
Chaplain visited patient at request of pt's neighbor, who is her friend.  Chaplain offered ministry of presence and prayer. [place] [facility]

## 2019-02-14 NOTE — TOC Progression Note (Addendum)
Transition of Care [facility]) - Progression Note    Patient Details  Name: Heather Lam MRN: [id number] Date of Birth: 1961-02-03  Transition of Care [facility]) CM/SW Contact  Jacalyn Lefevre Edson Snowball, RN Phone Number: 02/14/2019, 1:35 PM  Clinical Narrative:     Insurance denied [facility] negative pressure system.   Cory with [facility] called back and is unable to accept patient due to [place] focus plan.  Place benefits check per Linus Orn with [facility] is the only home health agency in network with patient's insurance, however patient needs per authorization and only DME agency in network is [employer] ( [place]) [phone].   Number for per authorization is [phone]. Called spoke to [place] with [facility]. Was given per authorization number for home health with [facility] and negative wound pressure system through [place]: [id number].   Mary requesting H and P , op note be faxed to [facility] at [phone] , same done.   Number given to [facility] with [facility], he is checking with his office to be sure they can accept referral.   Negative Wound System for [facility] paperwork completed and call Zack with [facility].    [facility] has accepted the referral.  Spoke with Zack , patient has a $231 / month co pay for negative pressure system. Patient aware and will pay. Edwyna Ready will bring negative pressure system to room. Bedside nurse Suezanne Jacquet will connect dressing to to home system.  Expected Discharge Plan: [place] Barriers to Discharge: Continued Medical Work up  Expected Discharge Plan and Services Expected Discharge Plan: [place] In-house Referral: Nutrition Discharge Planning Services: CM Consult Post Acute Care Choice: Home Health, Durable Medical Equipment Living arrangements for the past 2 months: Single Family Home Expected Discharge Date: 02/14/19               DME Arranged: Harlin Heys DME Agency: [facility] Date DME Agency Contacted: 02/13/19 Time  DME Agency Contacted: 16 Representative spoke with at DME Agency: Leontine Locket [place]: RN, PT [facility] Agency: [place] ([place]) Date [place]: 02/13/19 Time [place]: 1050 Representative spoke with at [place]: Butch Penny waiting to hear back if [place] can accept referral   Social Determinants of Health (SDOH) Interventions    Readmission Risk Interventions No flowsheet data found.

## 2019-02-14 NOTE — Consult Note (Addendum)
[place] Nurse wound follow up Wound type:Midline abdominal wound with NPWT  Jessica, PA at bedside and observes wound. Patient daughter at bedside for ostomy teaching.  Measurement: See assessment 02/12/19   Wound OVF:IEPP stitches Drainage (amount, consistency, odor) Minimal serosanguinous in canister.  Bleeds with cleansing.  Periwound:LLQ Colostomy Dressing procedure/placement/frequency:1 piece black foam. Covered with drape.  Seal achieved.  [place] Nurse ostomy follow up Daughter present at bedside.  Teaching and pouch change performed.  Stoma type/location: LLQ colostomy   Stomal assessment/size:  Flush, slightly oval Peristomal assessment: intact Treatment options for stomal/peristomal skin: barrier ring to promote seal Output liquid brown stool Ostomy pouching:2pc. 2 3/4" pouch with barrier ring  Education provided: Pouch change performed.  Discussed twice weekly pouch changes.  Showering with or without pouch.  Cleared to shower starting 02/16/19.  Daughter will coordinate that with  [facility] and shown how to disconnect from unit to shower prior to NPWT dressing change.  Ordered 5 pouch sets for discharge supplies Secure start enrolled but discharging now to her mothers home.  The address is:  [place] [address].  Will notiify secure start.  Will not follow at this time.  Please re-consult if needed.  Domenic Moras MSN, RN, FNP-BC CWON Wound, Ostomy, Continence Nurse Pager [phone]   Enrolled patient in [facility] Discharge program: Yes/No

## 2019-02-14 NOTE — Progress Notes (Addendum)
[facility]/Trauma Progress Note  5 Days Post-Op   Assessment/Plan Sigmoid diverticulitis with3.8 cmabscess, admission 06/27 -3-4prior episodes of diverticulitis, none that required hospitalization  - Last colonoscopy 2013 by Dr. Hilarie Fredrickson showed mild diverticulosis in the left colon -S/Pdrain placement06/57for the abscess,07/04s/p redrainage due to drain migration -S/P ex lap, LOA, sigmoid colectomy, end colostomy, drain and wound vac, Dr. Kae Heller, 07/10, POD4 - + gas and stool in bag  ID -cipro/flagyl 6/26-06/30; Zosyn 06/30-07/15day 19 of abx.WBCdown to 9.0, afebrile VTE -SCDs,lovenox FEN -soft diet Foley -none Follow up -TBD  Plan: okay for discharge from our standpoint. Removed JP drain. 5 more days of abx   LOS: 18 days    Subjective: CC: mild abdominal pain  No issues overnight. Tolerating diet. No nausea, vomiting, fever or chills. Ready to go home.   Objective: Vital signs in last 24 hours: Temp:  [98.4 F (36.9 C)-98.7 F (37.1 C)] 98.4 F (36.9 C) (07/15 0550) Pulse Rate:  [58-71] 71 (07/15 0550) Resp:  [17-18] 17 (07/15 0550) BP: (131-166)/(66-90) 166/90 (07/15 0550) SpO2:  [95 %-96 %] 95 % (07/15 0550) Weight:  [90 kg] 90 kg (07/15 0350) Last BM Date: 02/13/19  Intake/Output from previous day: 07/14 0701 - 07/15 0700 In: 2016.3 [P.O.:180; IV Piggyback:1836.3] Out: 1185 [Urine:1100; Drains:35; Stool:50] Intake/Output this shift: No intake/output data recorded.  PE: Gen: Alert, NAD, pleasant, cooperative Pulm:Rate andeffort normal Abd: Soft, ND, +BS,midline wound see below, gas and brown stool in bag. TTP around incision and of RLQ. No peritonitis. Drain with scant serosanguinous output. LLQ wound is well healing with no signs of infection (previous IR drain site) Skin: no rashes noted, warm and dry      Anti-infectives: Anti-infectives (From admission, onward)   Start     Dose/Rate Route Frequency Ordered  Stop   02/02/19 1415  fluconazole (DIFLUCAN) IVPB 400 mg     400 mg 100 mL/hr over 120 Minutes Intravenous Every 24 hours 02/02/19 1408 02/14/19 0822   01/30/19 1200  piperacillin-tazobactam (ZOSYN) IVPB 3.375 g     3.375 g 12.5 mL/hr over 240 Minutes Intravenous Every 8 hours 01/30/19 1045 02/14/19 0746   01/27/19 1015  ciprofloxacin (CIPRO) IVPB 400 mg  Status:  Discontinued     400 mg 200 mL/hr over 60 Minutes Intravenous Every 12 hours 01/27/19 0117 01/30/19 1039   01/27/19 0700  metroNIDAZOLE (FLAGYL) IVPB 500 mg  Status:  Discontinued     500 mg 100 mL/hr over 60 Minutes Intravenous Every 8 hours 01/27/19 0117 01/30/19 1039   01/26/19 2245  ciprofloxacin (CIPRO) IVPB 400 mg     400 mg 200 mL/hr over 60 Minutes Intravenous  Once 01/26/19 2232 01/27/19 0026   01/26/19 2245  metroNIDAZOLE (FLAGYL) IVPB 500 mg     500 mg 100 mL/hr over 60 Minutes Intravenous  Once 01/26/19 2232 01/27/19 0010      Lab Results:  Recent Labs    02/13/19 0349 02/14/19 0418  WBC 9.1 9.0  HGB 10.1* 10.8*  HCT 32.6* 34.1*  PLT 387 398   BMET Recent Labs    02/13/19 0349 02/14/19 0418  NA 139 137  K 4.4 3.7  CL 99 98  CO2 31 28  GLUCOSE 96 102*  BUN 7 6  CREATININE 0.50 0.51  CALCIUM 9.0 9.2   PT/INR No results for input(s): LABPROT, INR in the last 72 hours. CMP     Component Value Date/Time   NA 137 02/14/2019 0418   NA 140 01/25/2019  0924   K 3.7 02/14/2019 0418   CL 98 02/14/2019 0418   CO2 28 02/14/2019 0418   GLUCOSE 102 (H) 02/14/2019 0418   BUN 6 02/14/2019 0418   BUN 8 01/25/2019 0924   CREATININE 0.51 02/14/2019 0418   CALCIUM 9.2 02/14/2019 0418   PROT 5.2 (L) 01/30/2019 0224   PROT 6.8 01/25/2019 0924   ALBUMIN 2.1 (L) 01/30/2019 0224   ALBUMIN 4.0 01/25/2019 0924   AST 9 (L) 01/30/2019 0224   ALT 8 01/30/2019 0224   ALKPHOS 66 01/30/2019 0224   BILITOT 0.6 01/30/2019 0224   BILITOT 0.4 01/25/2019 0924   GFRNONAA >60 02/14/2019 0418   GFRAA >60 02/14/2019  0418   Lipase     Component Value Date/Time   LIPASE 26 01/26/2019 2032    Studies/Results: No results found.    Kalman Drape , [facility] 02/14/2019, 8:37 AM  Pager: [phone] Mon-Wed, Friday 7:00am-4:30pm Thurs 7am-11:30am  Consults: [phone]

## 2019-02-14 NOTE — Discharge Instructions (Signed)
Colostomy Home Guide, Adult  Colostomy surgery is done to create an opening in the front of the abdomen for stool (feces) to leave the body through an ostomy (stoma). Part of the large intestine is attached to the stoma. A bag, also called a pouch, is fitted over the stoma. Stool and gas will collect in the bag. After surgery, you will need to empty and change your colostomy bag as needed. You will also need to care for your stoma. How to care for the stoma Your stoma should look pink, red, and moist, like the inside of your cheek. Soon after surgery, the stoma may be swollen, but this swelling will go away within 6 weeks. To care for the stoma:  Keep the skin around the stoma clean and dry.  Use a clean, soft washcloth to gently wash the stoma and the skin around it. Clean using a circular motion, and wipe away from the stoma opening, not toward it. ? Use warm water and only use cleansers recommended by your health care provider. ? Rinse the stoma area with plain water. ? Dry the area around the stoma well.  Use stoma powder or ointment on your skin only as told by your health care provider. Do not use any other powders, gels, wipes, or creams on the skin around the stoma.  Check the stoma area every day for signs of infection. Check for: ? New or worsening redness, swelling, or pain. ? New or increased fluid or blood. ? Pus or warmth.  Measure the stoma opening regularly and record the size. Watch for changes. (It is normal for the stoma to get smaller as swelling goes away.) Share this information with your health care provider. How to empty the colostomy bag  Empty your bag at bedtime and whenever it is one-third to one-half full. Do not let the bag get more than half-full with stool or gas. The bag could leak if it gets too full. Some colostomy bags have a built-in gas release valve that releases gas often throughout the day. Follow these basic steps: 1. Wash your hands with soap and  water. 2. Sit far back on the toilet seat. 3. Put several pieces of toilet paper into the toilet water. This will prevent splashing as you empty stool into the toilet. 4. Remove the clip or the hook-and-loop fastener from the tail end of the bag. 5. Unroll the tail, then empty the stool into the toilet. 6. Clean the tail with toilet paper or a moist towelette. 7. Reroll the tail, and close it with the clip or the hook-and-loop fastener. 8. Wash your hands again. How to change the colostomy bag Change your bag every 3-4 days or as often as told by your health care provider. Also change the bag if it is leaking or separating from the skin, or if your skin around the stoma looks or feels irritated. Irritated skin may be a sign that the bag is leaking. Always have colostomy supplies with you, and follow these basic steps: 1. Wash your hands with soap and water. Have paper towels or tissues nearby to clean any discharge. 2. Remove the old bag and skin barrier. Use your fingers or a warm cloth to gently push the skin away from the barrier. 3. Clean the stoma area with water or with mild soap and water, as directed. Use water to rinse away any soap. 4. Dry the skin. You may use the cool setting on a hair dryer to do this.  5. Use a tracing pattern (template) to cut the skin barrier to the size needed. 6. If you are using a two-piece bag, attach the bag and the skin barrier to each other. Add the barrier ring, if you use one. 7. If directed, apply stoma powder or skin barrier gel to the skin. 8. Warm the skin barrier with your hands, or blow with a hair dryer for 5-10 seconds. 9. Remove the paper from the adhesive strip of the skin barrier. 10. Press the adhesive strip onto the skin around the stoma. 11. Gently rub the skin barrier onto the skin. This creates heat that helps the barrier to stick. 12. Apply stoma tape to the edges of the skin barrier, if desired. 4. Wash your hands again. General  recommendations  Avoid wearing tight clothes or having anything press directly on your stoma or bag. Change your clothing whenever it is soiled or damp.  You may shower or bathe with the bag on or off. Do not use harsh or oily soaps or lotions. Dry the skin and bag after bathing.  Store all supplies in a cool, dry place. Do not leave supplies in extreme heat because some parts can melt or not stick as well.  Whenever you leave home, take extra clothing and an extra skin barrier and bag with you.  If your bag gets wet, you can dry it with a hair dryer on the cool setting.  To prevent odor, you may put drops of ostomy deodorizer in the bag.  If recommended by your health care provider, put ostomy lubricant inside the bag. This helps stool to slide out of the bag more easily and completely. Contact a health care provider if:  You have new or worsening redness, swelling, or pain around your stoma.  You have new or increased fluid or blood coming from your stoma.  Your stoma feels warm to the touch.  You have pus coming from your stoma.  Your stoma extends in or out farther than normal.  You need to change your bag every day.  You have a fever. Get help right away if:  Your stool is bloody.  You have nausea or you vomit.  You have trouble breathing. Summary  Measure your stoma opening regularly and record the size. Watch for changes.  Empty your bag at bedtime and whenever it is one-third to one-half full. Do not let the bag get more than half-full with stool or gas.  Change your bag every 3-4 days or as often as told by your health care provider.  Whenever you leave home, take extra clothing and an extra skin barrier and bag with you. This information is not intended to replace advice given to you by your health care provider. Make sure you discuss any questions you have with your health care provider. Document Released: 07/22/2003 Document Revised: 11/08/2018 Document  Reviewed: 01/12/2017 Elsevier Patient Education  2020 [facility], [place] [phone]  OPEN ABDOMINAL SURGERY: POST OP INSTRUCTIONS  Always review your discharge instruction sheet given to you by the facility where your surgery was performed.  IF YOU HAVE DISABILITY OR FAMILY LEAVE FORMS, YOU MUST BRING THEM TO THE OFFICE FOR PROCESSING.  PLEASE DO NOT GIVE THEM TO YOUR DOCTOR.  1. A prescription for pain medication may be given to you upon discharge.  Take your pain medication as prescribed, if needed.  If narcotic pain medicine is not needed, then you  may take acetaminophen (Tylenol) or ibuprofen (Advil) as needed. 2. Take your usually prescribed medications unless otherwise directed. 3. If you need a refill on your pain medication, please contact your pharmacy. They will contact our office to request authorization.  Prescriptions will not be filled after 5pm or on week-ends. 4. You should follow a light diet the first few days after arrival home, such as soup and crackers, pudding, etc.unless your doctor has advised otherwise. A high-fiber, low fat diet can be resumed as tolerated.   Be sure to include lots of fluids daily. Most patients will experience some swelling and bruising on the chest and neck area.  Ice packs will help.  Swelling and bruising can take several days to resolve 5. Most patients will experience some swelling and bruising in the area of the incision. Ice pack will help. Swelling and bruising can take several days to resolve..  6. It is common to experience some constipation if taking pain medication after surgery.  Increasing fluid intake and taking a stool softener will usually help or prevent this problem from occurring.  A mild laxative (Milk of Magnesia or Miralax) should be taken according to package directions if there are no bowel movements after 48 hours. 7.  You may have steri-strips (small skin tapes) in place directly over the  incision.  These strips should be left on the skin for 7-10 days.  If your surgeon used skin glue on the incision, you may shower in 24 hours.  The glue will flake off over the next 2-3 weeks.  Any sutures or staples will be removed at the office during your follow-up visit. You may find that a light gauze bandage over your incision may keep your staples from being rubbed or pulled. You may shower and replace the bandage daily. 8. ACTIVITIES:  You may resume regular (light) daily activities beginning the next day--such as daily self-care, walking, climbing stairs--gradually increasing activities as tolerated.  You may have sexual intercourse when it is comfortable.  Refrain from any heavy lifting or straining until approved by your doctor. a. You may drive when you no longer are taking prescription pain medication, you can comfortably wear a seatbelt, and you can safely maneuver your car and apply brakes b. Return to Work: when approved by Engineer, production 9. You should see your doctor in the office for a follow-up appointment approximately two weeks after your surgery.  Make sure that you call for this appointment within a day or two after you arrive home to insure a convenient appointment time. OTHER INSTRUCTIONS:  _____________________________________________________________ _____________________________________________________________  WHEN TO CALL YOUR DOCTOR: 1. Fever over 101.0 2. Inability to urinate 3. Nausea and/or vomiting 4. Extreme swelling or bruising 5. Continued bleeding from incision. 6. Increased pain, redness, or drainage from the incision. 7. Difficulty swallowing or breathing 8. Muscle cramping or spasms. 9. Numbness or tingling in hands or feet or around lips.  The clinic staff is available to answer your questions during regular business hours.  Please dont hesitate to call and ask to speak to one of the nurses if you have concerns.  For further questions, please visit  [email or web address]

## 2019-02-14 NOTE — Progress Notes (Signed)
Pt discharged home with daughter in stable condition after going over discharge teaching with no concerns voiced. Wound vac dressing changed as tubing for new vac not compatible with hospital wound vac. Colostomy also changed due to having changed the wound vac dressing

## 2019-02-14 NOTE — Discharge Summary (Signed)
Physician Discharge Summary  Heather Lam LKT:[id number] DOB: 10-31-1960 DOA: 01/26/2019  PCP: Terald Sleeper, PA-C  Admit date: 01/26/2019 Discharge date: 02/14/2019  Admitted From: Home  Disposition: Home  Recommendations for Outpatient Follow-up:  1. Follow up with PCP in 1 week 2. Follow-up with general surgery as scheduled following discharge 3. Complete 5 additional days of Augmentin BID; complete previously prescribed medication  Home Health: Yes Equipment/Devices: Wound VAC  Discharge Condition: Stable CODE STATUS: Full code Diet recommendation: Heart Healthy   History of present illness:  58 y.o.WFWF with hx of Anxiety, CAD s/p stent placement in 2010, MI, HTN, diverticulitis, nephrolithiasis, HLD, OSA on CPAP presented to ER with worsening lower abdominal pain. sae pcp 1 day PTA  For ongoing diarrhea and abdominal pain X5 DAYS-and was diagnosed clinically as diverticulitis and started on Augmentin and Flagyl. Patient says that she did not feel better and in fact her bloating got worse with significant worsening of abdominal pain.   In the ER, CT scan of the abdomen pelvis showed acute complicated sigmoid diverticulitis- 3.8 cm abscess adjacent to sigmoid colon. Small to moderate volume of fluid within the pelvis with questionable rim enhancement.  General surgery  [facility] was consulted advised IV antibiotics and IR drainage of the abscess and was admitted.  Patient had low-grade fever and nonbloody diarrhea.  Hospital course:  Acute perforated sigmoid diverticulitis with intra-abdominal abscess:  Pt with 4-5 prior episodes of diverticulitis that did not require hospitalization.  Colonoscopy abt 5 yrs diverticulosis. S/P dain placement 6/29 for abscess. WBC was trending up, repeat CT 7/3 persistent acute left lower quadrant sigmoid diverticulitis with 2 pelvic abscesses, left lower quadrant abscess drain was retracted into the pericolonic fat.  S/p replacement of  LLQ drain and new  pelvic drain 7/4.  Despite conservative measures, patient continued with abdominal pain with worsening leukocytosis.  Repeat CT abdomen/pelvis on 02/08/2019 with thickening/fat stranding surrounding the sigmoid colon consistent with acute diverticulitis complicated by perforation and abscess.  Patient underwent exploratory laparotomy, lysis of inflammatory adhesions, sigmoid colectomy with end colostomy and drain and incisional wound VAC placement on 02/09/2019 by Dr. Kae Heller.  Patient continued on Zosyn 5 days postoperatively with resolution of leukocytosis.  Her diet was transitioned slowly with toleration and good ostomy output.  Patient will discharge home with home health services to include ostomy care and wound VAC.  Complete 5 additional days of antibiotics with Augmentin, which was previously prescribed outpatient.  Patient to follow-up with general surgery as scheduled.  Chronic diastolic CHF:  Compensated. Continue HCTZ   Swelling of the right upper extremity/and right lower leg.   U/S duplex negative for DVT.  Continue hydrochlorothiazide   Anxiety/depression  Continue home Xanax, buspirone, Cymbalta and trazodone.    Essential hypertension: Continue metoprolol, HCTZ losartan  CAD with prior stent: Cont asa.  Prediabetes: A1c 5.8.  Advise her to follow-up with PCP, for now continue on diet control.  HLD:  on statins  OSA: On Nocturnal CPAP but patient has been refusing.    Hypokalemia:  Repleted during hospitalization.  Was 3.7 at time of discharge.  Discharge Diagnoses:  Active Problems:   Acute diverticulitis   OSA on CPAP   Hyperlipidemia   Generalized anxiety disorder   Essential hypertension   Insomnia   Obesity (BMI 30-39.9)   Recurrent major depressive disorder, in partial remission ([place])   Adjustment disorder   Depression   History of MI (myocardial infarction)   Diverticulitis   Hypokalemia  Hypomagnesemia   Intra-abdominal abscess ([facility])   Left  lower quadrant abdominal pain    Discharge Instructions  Discharge Instructions    Call MD for:  extreme fatigue   Complete by: As directed    Call MD for:  persistant dizziness or light-headedness   Complete by: As directed    Call MD for:  persistant nausea and vomiting   Complete by: As directed    Call MD for:  redness, tenderness, or signs of infection (pain, swelling, redness, odor or green/yellow discharge around incision site)   Complete by: As directed    Call MD for:  severe uncontrolled pain   Complete by: As directed    Call MD for:  temperature >100.4   Complete by: As directed    Diet - low sodium heart healthy   Complete by: As directed    Increase activity slowly   Complete by: As directed      Allergies as of 02/14/2019   No Known Allergies     Medication List    STOP taking these medications   metroNIDAZOLE 500 MG tablet Commonly known as: FLAGYL     TAKE these medications   ALPRAZolam 0.25 MG tablet Commonly known as: XANAX Take 1 tablet (0.25 mg total) by mouth daily as needed for anxiety.   amoxicillin-clavulanate 875-125 MG tablet Commonly known as: AUGMENTIN Take 1 tablet by mouth 2 (two) times daily for 5 days.   aspirin EC 325 MG tablet Take 325 mg by mouth daily.   busPIRone 15 MG tablet Commonly known as: BUSPAR Take 1 tablet (15 mg total) by mouth 3 (three) times daily.   Cholecalciferol 50 MCG (2000 UT) Caps Take 1 capsule (2,000 Units total) by mouth daily.   Co Q10 100 MG Caps Take 1 capsule by mouth daily.   DULoxetine 30 MG capsule Commonly known as: CYMBALTA Take 2 capsules (60 mg total) by mouth daily.   econazole nitrate 1 % cream Apply topically 2 (two) times daily.   losartan-hydrochlorothiazide 100-25 MG tablet Commonly known as: HYZAAR Take 1 tablet by mouth daily.   metoprolol tartrate 25 MG tablet Commonly known as: LOPRESSOR TAKE 1 TABLET BY MOUTH 2 TIMES DAILY.   nitroGLYCERIN 0.4 MG SL  tablet Commonly known as: Nitrostat 1 TABLET UNDER TONGUE EVERY 5 MINUTES UP TO 3 TIMES FOR CHEST PAIN THEN CALL DR IF NO RELIEF What changed:   how much to take  how to take this  when to take this  reasons to take this   oxyCODONE 5 MG immediate release tablet Commonly known as: Roxicodone Take 1 tablet (5 mg total) by mouth every 6 (six) hours as needed for up to 7 days for severe pain.   rosuvastatin 10 MG tablet Commonly known as: CRESTOR Take 1 tablet (10 mg total) by mouth daily.   terbinafine 250 MG tablet Commonly known as: LAMISIL Take 1 tablet (250 mg total) by mouth daily.   traZODone 50 MG tablet Commonly known as: DESYREL Take 1-2 tablets (50-100 mg total) by mouth at bedtime as needed for sleep.   Vascepa 1 g Caps Generic drug: Icosapent Ethyl TAKE 2 CAPULES BY MOUTH TWICE DAILY What changed: See the new instructions.   Vitamin D (Ergocalciferol) 1.25 MG (50000 UT) Caps capsule Commonly known as: DRISDOL Take 1 capsule (50,000 Units total) by mouth every 7 (seven) days. What changed: when to take this            Durable Medical Equipment  (  From admission, onward)         Start     Ordered   02/13/19 0936  For home use only DME Negative pressure wound device  Once    Comments: Will require wound vac until wound is almost closed, could be up to or more than 3 months  Question Answer Comment  Frequency of dressing change 3 times per week   Length of need Other see comments   Dressing type Foam   Amount of suction 125 mm/Hg   Pressure application Continuous pressure   Supplies 10 canisters and 15 dressings per month for duration of therapy      02/13/19 0936         Follow-up Information    [facility] Follow up.   Specialty: Home Health Services Contact information: [place] [address] [phone]        Terald Sleeper, PA-C. Schedule an appointment as soon as possible for a visit in 1  week(s).   Specialties: Physician Assistant, Family Medicine Contact information: [place] [address] [phone]        Belva Crome, MD .   Specialty: Cardiology Contact information: [id number] [address] [place] [address] [phone]        Clovis Riley, MD. Go on 03/02/2019.   Specialty: General Surgery Why: at 9:50am. Please arrive 30 min prior to complete paperwork. Please bring photo ID and insurance card Contact information: [address] [place] [address] [phone]          No Known Allergies  Consultations:  [place] [facility]   Procedures/Studies: Ct Abdomen Pelvis W Contrast  Result Date: 02/08/2019 CLINICAL DATA:  Left lower quadrant pain, drain site, loose stools EXAM: CT ABDOMEN AND PELVIS WITH CONTRAST TECHNIQUE: Multidetector CT imaging of the abdomen and pelvis was performed using the standard protocol following bolus administration of intravenous contrast. CONTRAST:  162mL OMNIPAQUE IOHEXOL 300 MG/ML SOLN, additional oral enteric contrast COMPARISON:  02/02/2019, 02/03/2019 FINDINGS: Lower chest: Small right pleural effusion, decreased compared to prior examinations. Coronary artery calcifications and/or stents. Hepatobiliary: No solid liver abnormality is seen. No gallstones, gallbladder wall thickening, or biliary dilatation. Pancreas: Unremarkable. No pancreatic ductal dilatation or surrounding inflammatory changes. Spleen: Normal in size without significant abnormality. Adrenals/Urinary Tract: Adrenal glands are unremarkable. Kidneys are normal, without renal calculi, solid lesion, or hydronephrosis. Bladder is unremarkable. Stomach/Bowel: Stomach is within normal limits. There is redemonstrated extensive thickening and fat stranding about the sigmoid colon and adjacent mid small bowel loops. There has been interval repositioning of a left lower quadrant approach pigtail drainage  catheter, which now appears to be positioned within an air and fluid collection anterior to the mid sigmoid (series 3, image 71). This collection is not significantly changed in size, measuring approximately 4.5 cm. There are small locules of air about the catheter tract and within the left rectus musculature (series 3, image 69). Vascular/Lymphatic: Aortic atherosclerosis. No enlarged abdominal or pelvic lymph nodes. Reproductive: Uterine fibroids. Other: Small fat containing umbilical hernia. There has been interval placement of a left gluteal approach pigtail drainage catheter in a loculated appearing fluid collection about the superior aspect of the uterus and urinary bladder, which is substantially decreased in size and nearly resolved (series 3, image 75). Musculoskeletal: No acute or significant osseous findings. IMPRESSION: 1. There is redemonstrated extensive thickening and fat stranding about the sigmoid colon and adjacent mid  small bowel loops. Findings are consistent with acute diverticulitis complicated by perforation and abscess. 2. There has been interval repositioning of a left lower quadrant approach pigtail drainage catheter, which now appears to be positioned within an air and fluid collection anterior to the mid sigmoid (series 3, image 71). This collection is not significantly changed in size, measuring approximately 4.5 cm. There are small locules of air about the catheter tract and within the left rectus musculature (series 3, image 69) without a discrete fluid collection appreciated. 3. There has been interval placement of a left gluteal approach pigtail drainage catheter in a loculated appearing fluid collection about the superior aspect of the uterus and urinary bladder, which is substantially decreased in size and nearly resolved (series 3, image 75). 4.  Other chronic and incidental findings as detailed above. Electronically Signed   By: Eddie Candle M.D.   On: 02/08/2019 12:49   Ct  Abdomen Pelvis W Contrast  Result Date: 02/02/2019 CLINICAL DATA:  Diverticulitis with abscess status post drain 01/29/2019. Increasing white count. Persistent abdominal pain EXAM: CT ABDOMEN AND PELVIS WITH CONTRAST TECHNIQUE: Multidetector CT imaging of the abdomen and pelvis was performed using the standard protocol following bolus administration of intravenous contrast. CONTRAST:  152mL OMNIPAQUE IOHEXOL 300 MG/ML  SOLN COMPARISON:  None. FINDINGS: Lower chest: Increased small non loculated right effusion with right base compressive atelectasis. Normal heart size. Aorta atherosclerotic. Degenerative changes of lower thoracic spine. Hepatobiliary: No focal liver abnormality is seen. No gallstones, gallbladder wall thickening, or biliary dilatation. Pancreas: Unremarkable. No pancreatic ductal dilatation or surrounding inflammatory changes. Spleen: Normal in size without focal abnormality. Adrenals/Urinary Tract: Normal adrenal glands. Kidneys demonstrate no acute hydronephrosis, obstruction pattern, hydroureter, ureteral calculus or definite bladder abnormality. Bladder nondistended. Stable bilateral renal cysts, 1 with peripheral calcification in the left hilar region. Similar punctate nonobstructing right lower pole nephrolithiasis. Stomach/Bowel: Negative for bowel obstruction, significant dilatation, ileus, or free air. In the pelvis, the left lower quadrant drain has retracted into the pericolonic fat. There remains a pericolonic sigmoid diverticular abscess measuring 3.5 x 4.8 cm with air-fluid level. There is also dependent pelvic fluid with peripheral enhancement draped around the uterus measuring up to 9.5 x 12.2 cm, also suspicious for abscess. Similar acute diverticulitis in the left lower quadrant sigmoid colon with surrounding inflammation and wall thickening. Vascular/Lymphatic: Extensive aortic atherosclerosis. Negative for aneurysm. No retroperitoneal hemorrhage or hematoma. Mesenteric and renal  vasculature appear patent. No veno-occlusive process. No bulky adenopathy. Reproductive: Calcified uterine fibroids noted. Other: No inguinal or abdominal wall hernia. Diffuse body wall edema compatible with anasarca. Musculoskeletal: Degenerative changes of the spine. No acute osseous finding. IMPRESSION: Persistent acute left lower quadrant sigmoid diverticulitis with 2 pelvic abscesses as above. Left lower quadrant abscess drain has retracted into the pericolonic fat. No associated obstruction or ileus Developing small right pleural effusion with mild right lower lobe compressive atelectasis. Electronically Signed   By: Jerilynn Mages.  Shick M.D.   On: 02/02/2019 12:29   Ct Abdomen Pelvis W Contrast  Result Date: 01/26/2019 CLINICAL DATA:  Abdominal pain with diverticulitis suspected. EXAM: CT ABDOMEN AND PELVIS WITH CONTRAST TECHNIQUE: Multidetector CT imaging of the abdomen and pelvis was performed using the standard protocol following bolus administration of intravenous contrast. CONTRAST:  194mL OMNIPAQUE IOHEXOL 300 MG/ML  SOLN COMPARISON:  CT of the pelvis dated 11/19/2016 FINDINGS: Lower chest: The heart size is mildly enlarged. Hepatobiliary: No focal liver abnormality is seen. No gallstones, gallbladder wall thickening, or biliary dilatation. There  is likely underlying hepatic steatosis. Pancreas: Unremarkable. No pancreatic ductal dilatation or surrounding inflammatory changes. Spleen: Normal in size without focal abnormality. Adrenals/Urinary Tract: There is a stable peripherally calcified cyst involving the left kidney. There is no hydronephrosis. The adrenal glands are unremarkable. There is a relatively stable hyperdense cyst involving the anterior interpolar region of the right kidney. There are punctate nonobstructing stones in the lower pole of the right kidney. The bladder is unremarkable. Stomach/Bowel: There are findings of sigmoid diverticulitis complicated by a 3.8 x 3.3 cm abscess (axial series 2,  image 71). There is some reactive free fluid in the pelvis with questionable rim enhancement. There is severe sigmoid diverticulosis. The appendix is not reliably identified. There is a stable fat containing intraluminal lesion within the proximal jejunum. The stomach is unremarkable. Vascular/Lymphatic: Aortic atherosclerosis. No enlarged abdominal or pelvic lymph nodes. Reproductive: There is a partially calcified fibroid within the uterus. There is no evidence for an ovarian mass, however evaluation is somewhat limited by free fluid in the patient's pelvis. Other: There is no abdominal wall hernia. Musculoskeletal: No acute or significant osseous findings. IMPRESSION: 1. Findings consistent with acute complicated sigmoid diverticulitis. There is a 3.8 cm abscess adjacent to the sigmoid colon. There is a small to moderate volume of fluid within the pelvis with questionable rim enhancement. 2. Severe sigmoid diverticulosis. 3. Hepatic steatosis. 4. Nonobstructing stones in the lower pole the right kidney. 5.  Aortic Atherosclerosis (ICD10-I70.0). Electronically Signed   By: Constance Holster M.D.   On: 01/26/2019 22:25   Ct Image Guided Fluid Drain By Catheter  Result Date: 02/03/2019 INDICATION: Diverticular left lower quadrant pelvic abscesses EXAM: CT DRAIN PELVIC ABSCESS (TRANS GLUTEAL APPROACH) CT DRAIN LEFT LOWER QUADRANT ABSCESS MEDICATIONS: The patient is currently admitted to the hospital and receiving intravenous antibiotics. The antibiotics were administered within an appropriate time frame prior to the initiation of the procedure. ANESTHESIA/SEDATION: Fentanyl 4 mcg IV; Versed 200 mg IV Moderate Sedation Time:  50 MINUTES The patient was continuously monitored during the procedure by the interventional radiology nurse under my direct supervision. COMPLICATIONS: None immediate. PROCEDURE: Informed written consent was obtained from the patient after a thorough discussion of the procedural risks,  benefits and alternatives. All questions were addressed. Maximal Sterile Barrier Technique was utilized including caps, mask, sterile gowns, sterile gloves, sterile drape, hand hygiene and skin antiseptic. A timeout was performed prior to the initiation of the procedure. CT PELVIC ABSCESS DRAIN: Previous imaging reviewed. Patient position prone. Noncontrast localization CT performed. The pelvic fluid collection surrounding the uterus was localized. Overlying skin marked for a left trans gluteal approach. Under sterile conditions and local anesthesia, an 18 gauge access needle was advanced from a left trans gluteal approach into the fluid collection. Needle position confirmed with CT. Syringe aspiration yielded purulent fluid. Guidewire advanced followed by tract dilatation to insert a 10 [place] drain. Drain catheter position confirmed with CT. Syringe aspiration yielded 30 cc exudative fluid. Sample sent for culture. Catheter secured with Prolene suture and connected to external suction bulb. Sterile dressing applied. No immediate complication. Patient tolerated the procedure well. CT LEFT LOWER QUADRANT ABSCESS DRAIN: Patient was repositioned supine. Noncontrast localization CT performed. The left lower quadrant abscess adjacent to the sigmoid colon was localized and marked for a left anterior oblique approach. Under sterile conditions and local anesthesia, an 18 gauge 15 cm access needle was advanced from a left anterior oblique approach into the left lower quadrant abscess. Syringe aspiration yielded fecal contaminated  fluid. Sample sent for culture. Guidewire inserted followed by tract dilatation to insert a 10 [place] drain. Drain catheter position confirmed with CT. Catheter secured with a Prolene suture and connected to external suction bulb. Sterile dressing applied. No immediate complication. Patient tolerated the procedure well. IMPRESSION: Successful CT abscess drains within the pelvic abscess and within  the left lower quadrant abscess as above. Electronically Signed   By: Jerilynn Mages.  Shick M.D.   On: 02/03/2019 10:58   Ct Image Guided Fluid Drain By Catheter  Result Date: 02/03/2019 INDICATION: Diverticular left lower quadrant pelvic abscesses EXAM: CT DRAIN PELVIC ABSCESS (TRANS GLUTEAL APPROACH) CT DRAIN LEFT LOWER QUADRANT ABSCESS MEDICATIONS: The patient is currently admitted to the hospital and receiving intravenous antibiotics. The antibiotics were administered within an appropriate time frame prior to the initiation of the procedure. ANESTHESIA/SEDATION: Fentanyl 4 mcg IV; Versed 200 mg IV Moderate Sedation Time:  79 MINUTES The patient was continuously monitored during the procedure by the interventional radiology nurse under my direct supervision. COMPLICATIONS: None immediate. PROCEDURE: Informed written consent was obtained from the patient after a thorough discussion of the procedural risks, benefits and alternatives. All questions were addressed. Maximal Sterile Barrier Technique was utilized including caps, mask, sterile gowns, sterile gloves, sterile drape, hand hygiene and skin antiseptic. A timeout was performed prior to the initiation of the procedure. CT PELVIC ABSCESS DRAIN: Previous imaging reviewed. Patient position prone. Noncontrast localization CT performed. The pelvic fluid collection surrounding the uterus was localized. Overlying skin marked for a left trans gluteal approach. Under sterile conditions and local anesthesia, an 18 gauge access needle was advanced from a left trans gluteal approach into the fluid collection. Needle position confirmed with CT. Syringe aspiration yielded purulent fluid. Guidewire advanced followed by tract dilatation to insert a 10 [place] drain. Drain catheter position confirmed with CT. Syringe aspiration yielded 30 cc exudative fluid. Sample sent for culture. Catheter secured with Prolene suture and connected to external suction bulb. Sterile dressing applied. No  immediate complication. Patient tolerated the procedure well. CT LEFT LOWER QUADRANT ABSCESS DRAIN: Patient was repositioned supine. Noncontrast localization CT performed. The left lower quadrant abscess adjacent to the sigmoid colon was localized and marked for a left anterior oblique approach. Under sterile conditions and local anesthesia, an 18 gauge 15 cm access needle was advanced from a left anterior oblique approach into the left lower quadrant abscess. Syringe aspiration yielded fecal contaminated fluid. Sample sent for culture. Guidewire inserted followed by tract dilatation to insert a 10 [place] drain. Drain catheter position confirmed with CT. Catheter secured with a Prolene suture and connected to external suction bulb. Sterile dressing applied. No immediate complication. Patient tolerated the procedure well. IMPRESSION: Successful CT abscess drains within the pelvic abscess and within the left lower quadrant abscess as above. Electronically Signed   By: Jerilynn Mages.  Shick M.D.   On: 02/03/2019 10:58   Ct Image Guided Drainage By Percutaneous Catheter  Result Date: 01/29/2019 INDICATION: 58 year old female with acute sigmoid colonic diverticulitis complicated by intramural abscess formation. She presents for CT-guided drain placement. EXAM: CT-guided drain placement MEDICATIONS: The patient is currently admitted to the hospital and receiving intravenous antibiotics. The antibiotics were administered within an appropriate time frame prior to the initiation of the procedure. ANESTHESIA/SEDATION: Fentanyl 100 mcg IV; Versed 2 mg IV Moderate Sedation Time:  25 minutes The patient was continuously monitored during the procedure by the interventional radiology nurse under my direct supervision. COMPLICATIONS: None immediate. PROCEDURE: Informed written consent was obtained from  the patient after a thorough discussion of the procedural risks, benefits and alternatives. All questions were addressed. Maximal Sterile  Barrier Technique was utilized including caps, mask, sterile gowns, sterile gloves, sterile drape, hand hygiene and skin antiseptic. A timeout was performed prior to the initiation of the procedure. A planning axial CT scan was performed. The fluid and gas collection along the mesenteric border of the sigmoid colon was successfully identified. A suitable skin entry site was selected and marked. The overlying skin was sterilely prepped and draped in the standard fashion using chlorhexidine skin prep. Local anesthesia was attained by infiltration with 1% lidocaine. A small dermatotomy was made. Under intermittent CT guidance, an 18 gauge trocar needle was carefully advanced into the fluid and gas collection. An Amplatz wire was then coiled in the collection. The skin tract was dilated to 12 [place]. A Cook 12 [place] all-purpose drainage catheter was then advanced over the wire and formed. Aspiration yields approximately 20 mL thick purulent fluid. A sample was sent for Gram stain and culture. The drain was then secured to the skin with 0 Prolene suture and sterile bandages. IMPRESSION: Successful placement of a 12 French drainage catheter into the intramural abscess along the mesenteric border of the sigmoid wall. Aspiration yields approximately 20 mL purulent fluid. PLAN: 1. Maintain drain to JP bulb suction. 2. Flush at least once per shift. 3. Follow-up in drain clinic in 2 weeks with repeat CT scan of the pelvis with intravenous contrast and drain injection. Signed, Criselda Peaches, MD, [facility] Radiology Electronically Signed   By: Jacqulynn Cadet M.D.   On: 01/29/2019 15:10   Vas [place] Lower Extremity Venous (dvt)  Result Date: 02/01/2019  Lower Venous Study Indications: Swelling.  Risk Factors: None identified. Comparison Study: No prior studies. Performing Technologist: Oliver Hum RVT  Examination Guidelines: A complete evaluation includes  B-mode imaging, spectral Doppler, color Doppler, and power Doppler as needed of all accessible portions of each vessel. Bilateral testing is considered an integral part of a complete examination. Limited examinations for reoccurring indications may be performed as noted.  +---------+---------------+---------+-----------+----------+-------+ RIGHT    CompressibilityPhasicitySpontaneityPropertiesSummary +---------+---------------+---------+-----------+----------+-------+ CFV      Full           Yes      Yes                          +---------+---------------+---------+-----------+----------+-------+ SFJ      Full                                                 +---------+---------------+---------+-----------+----------+-------+ FV Prox  Full                                                 +---------+---------------+---------+-----------+----------+-------+ FV Mid   Full                                                 +---------+---------------+---------+-----------+----------+-------+ FV DistalFull                                                 +---------+---------------+---------+-----------+----------+-------+  PFV      Full                                                 +---------+---------------+---------+-----------+----------+-------+ POP      Full           Yes      Yes                          +---------+---------------+---------+-----------+----------+-------+ PTV      Full                                                 +---------+---------------+---------+-----------+----------+-------+ PERO     Full                                                 +---------+---------------+---------+-----------+----------+-------+   +----+---------------+---------+-----------+----------+-------+ LEFTCompressibilityPhasicitySpontaneityPropertiesSummary +----+---------------+---------+-----------+----------+-------+ CFV Full           Yes       Yes                          +----+---------------+---------+-----------+----------+-------+     Summary: Right: There is no evidence of deep vein thrombosis in the lower extremity. No cystic structure found in the popliteal fossa. Left: No evidence of common femoral vein obstruction.  *See table(s) above for measurements and observations. Electronically signed by Monica Martinez MD on 02/01/2019 at 2:20:18 PM.    Final    Vas [place] Upper Extremity Venous Duplex  Result Date: 02/01/2019 UPPER VENOUS STUDY  Indications: Swelling Risk Factors: None identified. Comparison Study: No prior studies. Performing Technologist: Oliver Hum RVT  Examination Guidelines: A complete evaluation includes B-mode imaging, spectral Doppler, color Doppler, and power Doppler as needed of all accessible portions of each vessel. Bilateral testing is considered an integral part of a complete examination. Limited examinations for reoccurring indications may be performed as noted.  Right Findings: +----------+------------+---------+-----------+----------+-------+ RIGHT     CompressiblePhasicitySpontaneousPropertiesSummary +----------+------------+---------+-----------+----------+-------+ IJV           Full       Yes       Yes                      +----------+------------+---------+-----------+----------+-------+ Subclavian    Full       Yes       Yes                      +----------+------------+---------+-----------+----------+-------+ Axillary      Full       Yes       Yes                      +----------+------------+---------+-----------+----------+-------+ Brachial      Full       Yes       Yes                      +----------+------------+---------+-----------+----------+-------+ Radial        Full                                          +----------+------------+---------+-----------+----------+-------+  Ulnar         Full                                           +----------+------------+---------+-----------+----------+-------+ Cephalic      Full                                          +----------+------------+---------+-----------+----------+-------+ Basilic       Full                                          +----------+------------+---------+-----------+----------+-------+  Left Findings: +----------+------------+---------+-----------+----------+-------+ LEFT      CompressiblePhasicitySpontaneousPropertiesSummary +----------+------------+---------+-----------+----------+-------+ Subclavian    Full       Yes       Yes                      +----------+------------+---------+-----------+----------+-------+  Summary:  Right: No evidence of deep vein thrombosis in the upper extremity. No evidence of superficial vein thrombosis in the upper extremity.  Left: No evidence of thrombosis in the subclavian.  *See table(s) above for measurements and observations.  Diagnosing physician: Monica Martinez MD Electronically signed by Monica Martinez MD on 02/01/2019 at 2:19:58 PM.    Final       Subjective: Patient seen and examined at bedside, resting comfortably.  Pain controlled.  Now having ostomy output with gas in bag.  Leukocytosis has resolved.  General surgery removed drain today.  Continues with wound VAC in place.  No other complaints this morning.  Ready for discharge home.  Denies headache, fever/chills/night sweats, no nausea vomiting/diarrhea, no chest pain, no palpitations, no abdominal pain, no cough/congestion.  No acute events overnight per nursing.   Discharge Exam: Vitals:   02/13/19 2049 02/14/19 0550  BP: (!) 148/70 (!) 166/90  Pulse: 66 71  Resp: 18 17  Temp: 98.7 F (37.1 C) 98.4 F (36.9 C)  SpO2: 96% 95%   Vitals:   02/13/19 1330 02/13/19 2049 02/14/19 0350 02/14/19 0550  BP: 131/66 (!) 148/70  (!) 166/90  Pulse: (!) 58 66  71  Resp:  18  17  Temp: 98.6 F (37 C) 98.7 F (37.1 C)  98.4 F (36.9 C)   TempSrc: Oral Oral  Oral  SpO2: 95% 96%  95%  Weight:   90 kg   Height:        General: Pt is alert, awake, not in acute distress Cardiovascular: RRR, S1/S2 +, no rubs, no gallops Respiratory: CTA bilaterally, no wheezing, no rhonchi Abdominal: Soft, NT, ND, bowel sounds +, ostomy site noted with stool present in gas, wound VAC site noted Extremities: no edema, no cyanosis    The results of significant diagnostics from this hospitalization (including imaging, microbiology, ancillary and laboratory) are listed below for reference.     Microbiology: Recent Results (from the past 240 hour(s))  Surgical pcr screen     Status: None   Collection Time: 02/07/19  6:43 AM   Specimen: Nasal Mucosa; Nasal Swab  Result Value Ref Range Status   MRSA, PCR NEGATIVE NEGATIVE Final   Staphylococcus aureus NEGATIVE NEGATIVE Final    Comment: (NOTE) The Xpert SA Assay (  FDA approved for NASAL specimens in patients 63 years of age and older), is one component of a comprehensive surveillance program. It is not intended to diagnose infection nor to guide or monitor treatment. Performed at [facility], [place] [address], [place], [place] [address]      Labs: BNP (last 3 results) No results for input(s): BNP in the last 8760 hours. Basic Metabolic Panel: Recent Labs  Lab 02/09/19 0503 02/10/19 0445 02/11/19 0339 02/12/19 0406 02/13/19 0349 02/14/19 0418  NA 135 131* 133* 137 139 137  K 3.5 4.7 4.9 4.5 4.4 3.7  CL 100 97* 99 101 99 98  CO2 24 24 27 29 31 28   GLUCOSE 107* 128* 104* 91 96 102*  BUN 6 8 9  <5* 7 6  CREATININE 0.57 0.56 0.52 0.49 0.50 0.51  CALCIUM 8.4* 8.2* 8.2* 8.5* 9.0 9.2  MG 1.5* 2.3 2.1  --   --   --    Liver Function Tests: No results for input(s): AST, ALT, ALKPHOS, BILITOT, PROT, ALBUMIN in the last 168 hours. No results for input(s): LIPASE, AMYLASE in the last 168 hours. No results for input(s): AMMONIA in the last 168 hours. CBC: Recent Labs  Lab  02/10/19 0445 02/11/19 0339 02/12/19 0406 02/13/19 0349 02/14/19 0418  WBC 21.1* 19.3* 11.0* 9.1 9.0  HGB 12.6 10.0* 9.3* 10.1* 10.8*  HCT 38.4 31.0* 30.1* 32.6* 34.1*  MCV 85.1 86.8 88.8 88.6 87.0  PLT 387 346 354 387 398   Cardiac Enzymes: No results for input(s): CKTOTAL, CKMB, CKMBINDEX, TROPONINI in the last 168 hours. BNP: Invalid input(s): POCBNP CBG: No results for input(s): GLUCAP in the last 168 hours. D-Dimer No results for input(s): DDIMER in the last 72 hours. Hgb A1c No results for input(s): HGBA1C in the last 72 hours. Lipid Profile No results for input(s): CHOL, HDL, LDLCALC, TRIG, CHOLHDL, LDLDIRECT in the last 72 hours. Thyroid function studies No results for input(s): TSH, T4TOTAL, T3FREE, THYROIDAB in the last 72 hours.  Invalid input(s): FREET3 Anemia work up No results for input(s): VITAMINB12, FOLATE, FERRITIN, TIBC, IRON, RETICCTPCT in the last 72 hours. Urinalysis    Component Value Date/Time   COLORURINE YELLOW 01/27/2019 0322   APPEARANCEUR CLEAR 01/27/2019 0322   APPEARANCEUR Clear 02/25/2016 0937   LABSPEC >1.046 (H) 01/27/2019 0322   PHURINE 5.0 01/27/2019 0322   GLUCOSEU NEGATIVE 01/27/2019 0322   HGBUR MODERATE (A) 01/27/2019 0322   BILIRUBINUR NEGATIVE 01/27/2019 0322   BILIRUBINUR Negative 02/25/2016 0937   KETONESUR NEGATIVE 01/27/2019 0322   PROTEINUR 100 (A) 01/27/2019 0322   UROBILINOGEN negative 03/24/2015 0937   UROBILINOGEN 0.2 05/26/2012 1230   NITRITE NEGATIVE 01/27/2019 0322   LEUKOCYTESUR TRACE (A) 01/27/2019 0322   Sepsis Labs Invalid input(s): PROCALCITONIN,  WBC,  LACTICIDVEN Microbiology Recent Results (from the past 240 hour(s))  Surgical pcr screen     Status: None   Collection Time: 02/07/19  6:43 AM   Specimen: Nasal Mucosa; Nasal Swab  Result Value Ref Range Status   MRSA, PCR NEGATIVE NEGATIVE Final   Staphylococcus aureus NEGATIVE NEGATIVE Final    Comment: (NOTE) The Xpert SA Assay (FDA approved for  NASAL specimens in patients 55 years of age and older), is one component of a comprehensive surveillance program. It is not intended to diagnose infection nor to guide or monitor treatment. Performed at [facility], [place] [address] [place], [place] [address]      Time coordinating discharge: Over 30 minutes  SIGNED:   Donnamarie Poag [place] DO  [facility] 02/14/2019, 11:16 AM

## 2019-02-16 ENCOUNTER — Other Ambulatory Visit: Payer: Self-pay | Admitting: *Deleted

## 2019-02-16 NOTE — Patient Outreach (Signed)
[facility]) Care Management  02/16/2019  DAMON BAISCH 1961/08/01 [id number]  Transition of care telephone call  Referral received: 01/29/19 Initial outreach: 02/16/19 Insurance: [employer]  Initial unsuccessful telephone call to patient's preferred (mobile)  number in order to complete transition of care assessment; no answer, left HIPAA compliant voicemail message requesting return call.   Objective: Per the electronic medical record, Ambry Dix was hospitalized at [facility] from 6/26-7/15/20 after presenting to the emergency department with abdominal pain related to a ruptured sigmoid diverticulum.  On 02/09/19 she underwent an urgent exploratory laparotomy, lysis of inflammatory adhesions, sigmoid colectomy, end colostomy; placement of incisional wound VAC.  Comorbidities include: CAD, s/p angioplasty, HTN, myocardial infarction, OSA and on CPAP, hyperlipidemia, insomnia, carpel tunnel syndrome, obesity, prediabetes, depression, Vit D deficiency She was discharged to home on 02/14/19 with order for home health RN for wound VAC and ostomy care and home health physical therapy to be provided by [facility].    Plan: This RNCM will route unsuccessful outreach letter with [place] Management pamphlet and 24 hour Nurse Advice Line [place] to [place] Management clinical pool to be mailed to patient's home address. This RNCM will attempt another outreach within 4 business days.  Barrington Ellison RN,CCM,CDE [place] Management Coordinator Office Phone [phone] Office Fax [phone]

## 2019-02-17 MED FILL — busPIRone HCL 15 MG TABS: 15 | 20 days supply | Qty: 60 | Fill #4

## 2019-02-20 ENCOUNTER — Other Ambulatory Visit: Payer: Self-pay | Admitting: *Deleted

## 2019-02-20 ENCOUNTER — Encounter: Payer: Self-pay | Admitting: *Deleted

## 2019-02-20 DIAGNOSIS — G43909 Migraine, unspecified, not intractable, without status migrainosus: Secondary | ICD-10-CM | POA: Insufficient documentation

## 2019-02-20 DIAGNOSIS — N760 Acute vaginitis: Secondary | ICD-10-CM | POA: Insufficient documentation

## 2019-02-20 DIAGNOSIS — B9689 Other specified bacterial agents as the cause of diseases classified elsewhere: Secondary | ICD-10-CM | POA: Insufficient documentation

## 2019-02-20 DIAGNOSIS — N200 Calculus of kidney: Secondary | ICD-10-CM | POA: Insufficient documentation

## 2019-02-20 DIAGNOSIS — O99019 Anemia complicating pregnancy, unspecified trimester: Secondary | ICD-10-CM | POA: Insufficient documentation

## 2019-02-20 DIAGNOSIS — I10 Essential (primary) hypertension: Secondary | ICD-10-CM | POA: Insufficient documentation

## 2019-02-20 DIAGNOSIS — N83209 Unspecified ovarian cyst, unspecified side: Secondary | ICD-10-CM | POA: Insufficient documentation

## 2019-02-20 NOTE — Patient Outreach (Signed)
[facility]) Care Management  02/20/2019  Heather Lam Heather Lam [id number]  Transition of care call/case closure   Referral received: 01/29/19 Initial outreach: 02/16/19 Insurance: [place] [employer]   Subjective: Successful telephone call ( on second outreach attempt) to patient's preferred (mobile)  number in order to complete transition of care assessment; 2 HIPAA identifiers verified. Explained purpose of call and completed transition of care assessment.  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 She says she is using a rolling walker to ambulate and decease the risk of falls.  She denies post-operative problems, says stoma is unremarkable and wound vac is in place, states surgical pain well managed with over the counter Tylenol, tolerating diet but doesn't have much of an appetite so she is drinking protein shakes twice daily, she denies bowel or bladder problems.  She is staying with her Mom as Heather Lam lives alone and her Mom and daughter are assisting with her recovery. She says her Mom's home is a one story home so she does not have to negotiate starrs in the home. She says [facility] intiated home health services on 7/18 and she will receive home health nursing 3 times weekly for ostomy and wound vac care and physical therapy twice weekly.  She states she is active in the [place] Management chronic disease management program.  She says she does not know if she purchased the hospital indemnity voluntary insurance and would appreciate the link to [place] to determine if she does.  She says she uses a [facility]- [facility]. .  She denies educational needs related to staying safe during the COVID 19 pandemic.    Objective: Heather Lam was hospitalized at [facility] from 6/26-7/15/20 after presenting to the  emergency department with abdominal pain related to a ruptured sigmoid diverticulum.  On 02/09/19 she underwent an urgent exploratory laparotomy, lysis of inflammatory adhesions, sigmoid colectomy,end colostomy;placement of incisional wound VAC.  Comorbidities include: CAD, s/p angioplasty, HTN, myocardial infarction, OSA and on CPAP, hyperlipidemia, insomnia, carpel tunnel syndrome, obesity, prediabetes, depression, Vit D deficiency She was discharged to home on 02/14/19 with order for home health RN for wound VAC and ostomy care and home health physical therapy to be provided by [facility].    Assessment:  Patient voices good understanding of all discharge instructions.  See transition of care flowsheet for assessment details.   Plan:  At [place] request, will e-mail the contact numbers for [place], the hyperlink to the [facility], [place] and [place] to her personal e-mail address. Reviewed hospital discharge diagnosis of ruptured sigmoid diverticulum and treatment plan using hospital discharge instructions, assessing medication adherence, reviewing problems requiring provider notification, and discussing the importance of follow up with surgeon, primary care provider and/or specialists as directed. Reviewed [place] 2020 Wellness Requirements of: Completing the computerized Health Assessment and the Health Action Step with Active Health Management [facility]) by April 03 2019 AND have an annual physical between August 02, 2017 and April 03, 2019 in order to qualify for the 2021 Healthy Lifestyle Premium. No ongoing care management needs identified so will close case to [place] Management services.

## 2019-02-21 ENCOUNTER — Ambulatory Visit (INDEPENDENT_AMBULATORY_CARE_PROVIDER_SITE_OTHER): Payer: No Typology Code available for payment source | Admitting: Family Medicine

## 2019-02-21 ENCOUNTER — Other Ambulatory Visit: Payer: Self-pay

## 2019-02-21 VITALS — BP 110/70 | HR 78 | Temp 97.3°F | Ht 62.0 in | Wt 182.0 lb

## 2019-02-21 DIAGNOSIS — Z9049 Acquired absence of other specified parts of digestive tract: Secondary | ICD-10-CM

## 2019-02-21 DIAGNOSIS — Z09 Encounter for follow-up examination after completed treatment for conditions other than malignant neoplasm: Secondary | ICD-10-CM | POA: Diagnosis not present

## 2019-02-21 DIAGNOSIS — Z933 Colostomy status: Secondary | ICD-10-CM | POA: Diagnosis not present

## 2019-02-21 NOTE — Patient Instructions (Signed)

## 2019-02-21 NOTE — Progress Notes (Signed)
Subjective: CC: Hospital follow up PCP: Terald Sleeper, PA-C Heather Lam is a 58 y.o. female presenting to clinic today for:  1. Hospital follow up for complicated diverticulitis Patient was admitted to the hospital on 7/65/4650 with complicated sigmoid diverticulitis.  She was discharged home on 02/14/2019 with home health RN and PT. Her CT scan demonstrated abscess within the pelvis.  On presentation, her course was significant for refractory infection despite 2 drains and IV antibiotics.  She subsequently had a sigmoid colectomy and an end colostomy with incisional wound VAC placement on 02/09/2019.  She was discharged home on 5 additional days of Augmentin.  She follows up today and notes that overall she is doing okay.  She notes that her liquid stools have gotten slightly thickened.  She does occasionally feel the sensation that she needs to defecate.  She denies any substantial abdominal pain that is not controlled by Tylenol.  In fact she has discontinued use of the opioid because she does not want to be on sedating, addictive medication.  She does report generalized weakness and fatigue.  She notes that she only got up a couple of times out of bed while hospitalized for 3 weeks.  She does have physical therapy coming to her home and working with her to strengthen.  She is tolerating p.o. intake normally.  No fevers.  No nausea or vomiting.  She has follow-up with general surgery soon.  There are plans for repeat colonoscopy once she has her colostomy removed and her bowel repaired.   ROS: Per HPI  No Known Allergies Past Medical History:  Diagnosis Date  . Anxiety   . Back pain   . CAD (coronary artery disease)   . Chest pain   . Complication of anesthesia   . Constipation   . Diverticulitis   . Diverticulosis   . Heart attack ([place]) 2010  . History of kidney stones   . Hyperlipemia   . Hypertension   . Joint pain   . PONV (postoperative nausea and vomiting)   . Skin  cancer    on nose  . Sleep apnea    wears CPAP nightly  . Vitamin D deficiency     Current Outpatient Medications:  .  acetaminophen (TYLENOL) 500 MG tablet, Take 500-1,000 mg by mouth every 6 (six) hours as needed., Disp: , Rfl:  .  ALPRAZolam (XANAX) 0.25 MG tablet, Take 1 tablet (0.25 mg total) by mouth daily as needed for anxiety., Disp: 30 tablet, Rfl: 0 .  aspirin EC 325 MG tablet, Take 325 mg by mouth daily., Disp: , Rfl:  .  busPIRone (BUSPAR) 15 MG tablet, Take 1 tablet (15 mg total) by mouth 3 (three) times daily., Disp: 60 tablet, Rfl: 5 .  Coenzyme Q10 (CO Q10) 100 MG CAPS, Take 1 capsule by mouth daily., Disp: , Rfl:  .  DULoxetine (CYMBALTA) 30 MG capsule, Take 2 capsules (60 mg total) by mouth daily., Disp: 180 capsule, Rfl: 1 .  losartan-hydrochlorothiazide (HYZAAR) 100-25 MG tablet, Take 1 tablet by mouth daily., Disp: 90 tablet, Rfl: 2 .  metoprolol tartrate (LOPRESSOR) 25 MG tablet, TAKE 1 TABLET BY MOUTH 2 TIMES DAILY. (Patient taking differently: Take 25 mg by mouth 2 (two) times daily. ), Disp: 180 tablet, Rfl: 3 .  rosuvastatin (CRESTOR) 10 MG tablet, Take 1 tablet (10 mg total) by mouth daily., Disp: 90 tablet, Rfl: 1 .  terbinafine (LAMISIL) 250 MG tablet, Take 1 tablet (250 mg total) by  mouth daily., Disp: 30 tablet, Rfl: 5 .  traZODone (DESYREL) 50 MG tablet, Take 1-2 tablets (50-100 mg total) by mouth at bedtime as needed for sleep., Disp: 90 tablet, Rfl: 3 .  VASCEPA 1 g CAPS, TAKE 2 CAPULES BY MOUTH TWICE DAILY (Patient taking differently: Take 2 capsules by mouth 2 (two) times a day. ), Disp: 360 capsule, Rfl: 2 .  Vitamin D, Ergocalciferol, (DRISDOL) 1.25 MG (50000 UT) CAPS capsule, Take 1 capsule (50,000 Units total) by mouth every 7 (seven) days. (Patient taking differently: Take 50,000 Units by mouth every Sunday. ), Disp: 12 capsule, Rfl: 3 .  nitroGLYCERIN (NITROSTAT) 0.4 MG SL tablet, 1 TABLET UNDER TONGUE EVERY 5 MINUTES UP TO 3 TIMES FOR CHEST PAIN THEN  CALL DR IF NO RELIEF (Patient taking differently: Place 0.4 mg under the tongue every 5 (five) minutes as needed for chest pain. 1 TABLET UNDER TONGUE EVERY 5 MINUTES UP TO 3 TIMES FOR CHEST PAIN THEN CALL DR IF NO RELIEF), Disp: 25 tablet, Rfl: 3 .  oxyCODONE (ROXICODONE) 5 MG immediate release tablet, Take 1 tablet (5 mg total) by mouth every 6 (six) hours as needed for up to 7 days for severe pain. (Patient not taking: Reported on 02/20/2019), Disp: 28 tablet, Rfl: 0 Social History   Socioeconomic History  . Marital status: Single    Spouse name: Not on file  . Number of children: Not on file  . Years of education: Not on file  . Highest education level: Not on file  Occupational History  . 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 Financial resource strain: Not on file  . Food insecurity    Worry: Not on file    Inability: Not on file  . Transportation needs    Medical: Not on file    Non-medical: Not on file  Tobacco Use  . Smoking status: Former Research scientist (life sciences)  . Smokeless tobacco: Never Used  Substance and Sexual Activity  . Alcohol use: Yes    Comment: Rare   . Drug use: No  . Sexual activity: Yes  Lifestyle  . Physical activity    Days per week: Not on file    Minutes per session: Not on file  . Stress: Not on file  Relationships  . Social Herbalist on phone: Not on file    Gets together: Not on file    Attends religious service: Not on file    Active member of club or organization: Not on file    Attends meetings of clubs or organizations: Not on file    Relationship status: Not on file  . Intimate partner violence    Fear of current or ex partner: Not on file    Emotionally abused: Not on file    Physically abused: Not on file    Forced sexual activity: Not on file  Other Topics Concern  . Not on file  Social History Narrative   Caffeine daily    Family History  Problem  Relation Age of Onset  . Hyperlipidemia Mother   . Hypertension Mother   . Cancer Father        skin  . Hyperlipidemia Father   . Dementia Father   . Arthritis Father   . Hypertension Father   . Sleep apnea Father   . Other Sister        Myelofibrosis  .  Arthritis Sister   . Hyperlipidemia Sister   . Arthritis Sister   . Hyperlipidemia Sister   . Diabetes Paternal Grandfather   . Colon cancer Neg Hx     Objective: Office vital signs reviewed. BP 110/70   Pulse 78   Temp (!) 97.3 F (36.3 C) (Oral)   Ht 5\' 2"  (1.575 m)   Wt 182 lb (82.6 kg)   LMP 12/31/2013   BMI 33.29 kg/m   Physical Examination:  General: Awake, alert, pale appearing, No acute distress HEENT: Normal,  Sclera white, MMM Cardio: regular rate and rhythm, S1S2 heard, no murmurs appreciated Pulm: clear to auscultation bilaterally, no wheezes, rhonchi or rales; normal work of breathing on room air GI: Ostomy in place with green/ brown liquid stool. Wound vac in place. Granulation tissue noted but no bleeding or evidence of infection Extremities: decrease muscle tone in LEs bilaterally. Ambulates independently.  Assessment/ Plan: 58 y.o. female   1. S/P partial colectomy I have reviewed her hospital course, discharge summary and recommendations.  We will recheck CBC and BMP.  These were normal at discharge.  She is status post completion of antibiotics.  She has home health physical therapy, RN in place.  She has follow-up with her surgeon.  We did discuss that her strength will likely take some time to come back given how long she was bedridden.  I have encouraged her to contact us should any needs arise.   - CBC - Basic Metabolic Panel  2. Hospital discharge follow-up  3. Colostomy status ([place]) - CBC - Basic Metabolic Panel   Orders Placed This Encounter  Procedures  . CBC  . Basic Metabolic Panel   No orders of the defined types were placed in this encounter.    [name], DO  [place] [phone]

## 2019-02-22 ENCOUNTER — Encounter: Payer: Self-pay | Admitting: Family Medicine

## 2019-02-22 LAB — BASIC METABOLIC PANEL
BUN/Creatinine Ratio: 24 — ABNORMAL HIGH (ref 9–23)
BUN: 14 mg/dL (ref 6–24)
CO2: 23 mmol/L (ref 20–29)
Calcium: 10.1 mg/dL (ref 8.7–10.2)
Chloride: 97 mmol/L (ref 96–106)
Creatinine, Ser: 0.58 mg/dL (ref 0.57–1.00)
GFR calc Af Amer: 117 mL/min/{1.73_m2} (ref >59)
GFR calc non Af Amer: 102 mL/min/{1.73_m2} (ref >59)
Glucose: 87 mg/dL (ref 65–99)
Potassium: 4.7 mmol/L (ref 3.5–5.2)
Sodium: 139 mmol/L (ref 134–144)

## 2019-02-22 LAB — CBC
Hematocrit: 39.6 % (ref 34.0–46.6)
Hemoglobin: 12.6 g/dL (ref 11.1–15.9)
MCH: 26.9 pg (ref 26.6–33.0)
MCHC: 31.8 g/dL (ref 31.5–35.7)
MCV: 85 fL (ref 79–97)
Platelets: 370 10*3/uL (ref 150–450)
RBC: 4.68 x10E6/uL (ref 3.77–5.28)
RDW: 14.9 % (ref 11.7–15.4)
WBC: 16.5 10*3/uL — ABNORMAL HIGH (ref 3.4–10.8)

## 2019-02-26 MED FILL — VASCEPA 1 GM CAPSULE: 1 | 90 days supply | Qty: 360 | Fill #2

## 2019-02-27 ENCOUNTER — Other Ambulatory Visit: Payer: Self-pay | Admitting: Physician Assistant

## 2019-02-27 DIAGNOSIS — Z9049 Acquired absence of other specified parts of digestive tract: Secondary | ICD-10-CM

## 2019-03-05 ENCOUNTER — Other Ambulatory Visit: Payer: Self-pay

## 2019-03-05 ENCOUNTER — Ambulatory Visit (INDEPENDENT_AMBULATORY_CARE_PROVIDER_SITE_OTHER): Payer: No Typology Code available for payment source

## 2019-03-05 DIAGNOSIS — Z4801 Encounter for change or removal of surgical wound dressing: Secondary | ICD-10-CM

## 2019-03-05 DIAGNOSIS — Z48815 Encounter for surgical aftercare following surgery on the digestive system: Secondary | ICD-10-CM

## 2019-03-05 DIAGNOSIS — Z955 Presence of coronary angioplasty implant and graft: Secondary | ICD-10-CM

## 2019-03-05 DIAGNOSIS — E669 Obesity, unspecified: Secondary | ICD-10-CM

## 2019-03-05 DIAGNOSIS — I2510 Atherosclerotic heart disease of native coronary artery without angina pectoris: Secondary | ICD-10-CM

## 2019-03-05 DIAGNOSIS — Z87891 Personal history of nicotine dependence: Secondary | ICD-10-CM

## 2019-03-05 DIAGNOSIS — Q6102 Congenital multiple renal cysts: Secondary | ICD-10-CM

## 2019-03-05 DIAGNOSIS — Z9181 History of falling: Secondary | ICD-10-CM

## 2019-03-05 DIAGNOSIS — G4700 Insomnia, unspecified: Secondary | ICD-10-CM

## 2019-03-05 DIAGNOSIS — Z433 Encounter for attention to colostomy: Secondary | ICD-10-CM

## 2019-03-05 DIAGNOSIS — Z85828 Personal history of other malignant neoplasm of skin: Secondary | ICD-10-CM

## 2019-03-05 DIAGNOSIS — Z6832 Body mass index (BMI) 32.0-32.9, adult: Secondary | ICD-10-CM

## 2019-03-05 DIAGNOSIS — I252 Old myocardial infarction: Secondary | ICD-10-CM

## 2019-03-05 DIAGNOSIS — K651 Peritoneal abscess: Secondary | ICD-10-CM

## 2019-03-05 DIAGNOSIS — K5732 Diverticulitis of large intestine without perforation or abscess without bleeding: Secondary | ICD-10-CM

## 2019-03-05 DIAGNOSIS — I119 Hypertensive heart disease without heart failure: Secondary | ICD-10-CM

## 2019-03-05 DIAGNOSIS — L570 Actinic keratosis: Secondary | ICD-10-CM

## 2019-03-05 DIAGNOSIS — K760 Fatty (change of) liver, not elsewhere classified: Secondary | ICD-10-CM

## 2019-03-05 DIAGNOSIS — G4733 Obstructive sleep apnea (adult) (pediatric): Secondary | ICD-10-CM

## 2019-03-05 DIAGNOSIS — Z7982 Long term (current) use of aspirin: Secondary | ICD-10-CM

## 2019-03-05 DIAGNOSIS — R7303 Prediabetes: Secondary | ICD-10-CM

## 2019-03-05 DIAGNOSIS — N200 Calculus of kidney: Secondary | ICD-10-CM

## 2019-03-05 DIAGNOSIS — F411 Generalized anxiety disorder: Secondary | ICD-10-CM

## 2019-03-05 DIAGNOSIS — E876 Hypokalemia: Secondary | ICD-10-CM

## 2019-03-05 DIAGNOSIS — F3341 Major depressive disorder, recurrent, in partial remission: Secondary | ICD-10-CM

## 2019-03-05 DIAGNOSIS — E559 Vitamin D deficiency, unspecified: Secondary | ICD-10-CM

## 2019-03-05 DIAGNOSIS — E785 Hyperlipidemia, unspecified: Secondary | ICD-10-CM

## 2019-03-05 DIAGNOSIS — F4320 Adjustment disorder, unspecified: Secondary | ICD-10-CM

## 2019-03-05 DIAGNOSIS — M549 Dorsalgia, unspecified: Secondary | ICD-10-CM

## 2019-03-05 DIAGNOSIS — I700 Atherosclerosis of aorta: Secondary | ICD-10-CM

## 2019-03-05 MED FILL — METOPROLOL TARTRATE 25 MG T: 25 | 90 days supply | Qty: 180 | Fill #1

## 2019-03-15 ENCOUNTER — Telehealth: Payer: Self-pay | Admitting: *Deleted

## 2019-03-15 NOTE — Telephone Encounter (Signed)
Pt came in for dressing change Wound has discolored drainage (green) Please advise

## 2019-03-16 ENCOUNTER — Other Ambulatory Visit: Payer: Self-pay | Admitting: Nurse Practitioner

## 2019-03-16 MED ORDER — CEPHALEXIN 500 MG PO CAPS: 500.0000 mg | ORAL_CAPSULE | Freq: Four times a day (QID) | ORAL | 0 refills | Status: DC

## 2019-03-18 MED FILL — VIT D2 1.25 MG (50,000 UNIT: 1.25 MG | 84 days supply | Qty: 12 | Fill #2

## 2019-03-18 MED FILL — busPIRone HCL 15 MG TABS: 15 | 20 days supply | Qty: 60 | Fill #5

## 2019-03-19 MED FILL — LOSARTAN-HCTZ 100-25 MG TAB: 100-25 | 30 days supply | Qty: 30 | Fill #7

## 2019-04-02 MED FILL — DULoxetine HCL 30 MG CPEP: 30 | 90 days supply | Qty: 180 | Fill #1

## 2019-04-02 MED FILL — traZODone HCL 50 MG TABS: 50 | 45 days supply | Qty: 90 | Fill #2

## 2019-04-14 ENCOUNTER — Other Ambulatory Visit: Payer: Self-pay | Admitting: Physician Assistant

## 2019-04-14 DIAGNOSIS — F3289 Other specified depressive episodes: Secondary | ICD-10-CM

## 2019-04-14 DIAGNOSIS — F411 Generalized anxiety disorder: Secondary | ICD-10-CM

## 2019-04-16 MED FILL — busPIRone HCL 15 MG TABS: 15 | 20 days supply | Qty: 60 | Fill #0

## 2019-04-21 ENCOUNTER — Other Ambulatory Visit: Payer: Self-pay | Admitting: Physician Assistant

## 2019-04-21 MED FILL — LOSARTAN-HCTZ 100-25 MG TAB: 100-25 | 30 days supply | Qty: 30 | Fill #8

## 2019-04-23 MED FILL — risperiDONE 0.5 MG TABS: 0.5 | 90 days supply | Qty: 90 | Fill #0

## 2019-04-27 ENCOUNTER — Other Ambulatory Visit: Payer: Self-pay

## 2019-04-27 ENCOUNTER — Encounter: Payer: Self-pay | Admitting: Physician Assistant

## 2019-04-27 ENCOUNTER — Other Ambulatory Visit: Payer: Self-pay | Admitting: Physician Assistant

## 2019-04-27 ENCOUNTER — Ambulatory Visit (INDEPENDENT_AMBULATORY_CARE_PROVIDER_SITE_OTHER): Payer: No Typology Code available for payment source | Admitting: Physician Assistant

## 2019-04-27 VITALS — BP 116/62 | HR 69 | Temp 97.5°F | Ht 62.0 in | Wt 185.0 lb

## 2019-04-27 DIAGNOSIS — M791 Myalgia, unspecified site: Secondary | ICD-10-CM | POA: Diagnosis not present

## 2019-04-27 DIAGNOSIS — L659 Nonscarring hair loss, unspecified: Secondary | ICD-10-CM

## 2019-04-27 DIAGNOSIS — Z0000 Encounter for general adult medical examination without abnormal findings: Secondary | ICD-10-CM

## 2019-04-27 DIAGNOSIS — Z933 Colostomy status: Secondary | ICD-10-CM | POA: Diagnosis not present

## 2019-04-27 DIAGNOSIS — Z9049 Acquired absence of other specified parts of digestive tract: Secondary | ICD-10-CM

## 2019-04-28 LAB — CMP14+EGFR
ALT: 8 IU/L (ref 0–32)
AST: 11 IU/L (ref 0–40)
Albumin/Globulin Ratio: 1.5 (ref 1.2–2.2)
Albumin: 4 g/dL (ref 3.8–4.9)
Alkaline Phosphatase: 73 IU/L (ref 39–117)
BUN/Creatinine Ratio: 26 — ABNORMAL HIGH (ref 9–23)
BUN: 15 mg/dL (ref 6–24)
Bilirubin Total: <0.2 mg/dL (ref 0.0–1.2)
CO2: 28 mmol/L (ref 20–29)
Calcium: 9.7 mg/dL (ref 8.7–10.2)
Chloride: 99 mmol/L (ref 96–106)
Creatinine, Ser: 0.57 mg/dL (ref 0.57–1.00)
GFR calc Af Amer: 118 mL/min/{1.73_m2} (ref >59)
GFR calc non Af Amer: 103 mL/min/{1.73_m2} (ref >59)
Globulin, Total: 2.7 g/dL (ref 1.5–4.5)
Glucose: 105 mg/dL — ABNORMAL HIGH (ref 65–99)
Potassium: 3.6 mmol/L (ref 3.5–5.2)
Sodium: 141 mmol/L (ref 134–144)
Total Protein: 6.7 g/dL (ref 6.0–8.5)

## 2019-04-28 LAB — THYROID PANEL WITH TSH
Free Thyroxine Index: 2.4 (ref 1.2–4.9)
T3 Uptake Ratio: 34 % (ref 24–39)
T4, Total: 7.2 ug/dL (ref 4.5–12.0)
TSH: 1.53 u[IU]/mL (ref 0.450–4.500)

## 2019-04-28 LAB — CBC WITH DIFFERENTIAL/PLATELET
Basophils Absolute: 0.1 10*3/uL (ref 0.0–0.2)
Basos: 1 %
EOS (ABSOLUTE): 0.3 10*3/uL (ref 0.0–0.4)
Eos: 3 %
Hematocrit: 42.8 % (ref 34.0–46.6)
Hemoglobin: 13.6 g/dL (ref 11.1–15.9)
Immature Grans (Abs): 0.1 10*3/uL (ref 0.0–0.1)
Immature Granulocytes: 1 %
Lymphocytes Absolute: 3.1 10*3/uL (ref 0.7–3.1)
Lymphs: 30 %
MCH: 26.9 pg (ref 26.6–33.0)
MCHC: 31.8 g/dL (ref 31.5–35.7)
MCV: 85 fL (ref 79–97)
Monocytes Absolute: 0.6 10*3/uL (ref 0.1–0.9)
Monocytes: 6 %
Neutrophils Absolute: 6.2 10*3/uL (ref 1.4–7.0)
Neutrophils: 59 %
Platelets: 235 10*3/uL (ref 150–450)
RBC: 5.06 x10E6/uL (ref 3.77–5.28)
RDW: 14.6 % (ref 11.7–15.4)
WBC: 10.3 10*3/uL (ref 3.4–10.8)

## 2019-04-28 LAB — MAGNESIUM: Magnesium: 1.9 mg/dL (ref 1.6–2.3)

## 2019-04-28 LAB — VITAMIN D 25 HYDROXY (VIT D DEFICIENCY, FRACTURES): Vit D, 25-Hydroxy: 61.8 ng/mL (ref 30.0–100.0)

## 2019-04-28 LAB — IRON: Iron: 42 ug/dL (ref 27–159)

## 2019-04-28 LAB — VITAMIN B12: Vitamin B-12: 311 pg/mL (ref 232–1245)

## 2019-04-29 ENCOUNTER — Encounter: Payer: Self-pay | Admitting: Physician Assistant

## 2019-04-29 NOTE — Progress Notes (Signed)
BP 116/62   Pulse 69   Temp (!) 97.5 F (36.4 C) (Temporal)   Ht _0  (1.575 m)   Wt 185 lb (83.9 kg)   LMP 12/31/2013   SpO2 97%   BMI 33.84 kg/m    Subjective:    Patient ID: Heather Lam, female    DOB: May 20, 1961, 58 y.o.   MRN: [id number]  HPI: Heather Lam is a 58 y.o. female presenting on 04/27/2019 for Alopecia  This patient is having a office visit today for concerned about the patient.  The patient did have a colectomy a few months ago.  She does have a colostomy bag in place.  She states she really has not seen any change in her bowel movements.  She states she has been trying to eat much better.  She denies any areas of complete baldness but just generalized hair loss.  She can tell a great difference in the past month.  We have not had any labs performed in a while.  We will have those done today and look for some of the nutrients in case she is losing those with her nutrition.  Past Medical History:  Diagnosis Date  . Anxiety   . Back pain   . CAD (coronary artery disease)   . Chest pain   . Complication of anesthesia   . Constipation   . Diverticulitis   . Diverticulosis   . Heart attack ([place]) 2010  . History of kidney stones   . Hyperlipemia   . Hypertension   . Joint pain   . PONV (postoperative nausea and vomiting)   . Skin cancer    on nose  . Sleep apnea    wears CPAP nightly  . Vitamin D deficiency    Relevant past medical, surgical, family and social history reviewed and updated as indicated. Interim medical history since our last visit reviewed. Allergies and medications reviewed and updated. DATA REVIEWED: CHART IN EPIC  Family History reviewed for pertinent findings.  Review of Systems  Constitutional: Positive for fatigue. Negative for activity change, fever and unexpected weight change.  HENT: Negative.   Eyes: Negative.   Respiratory: Negative.  Negative for cough, shortness of breath and wheezing.   Cardiovascular: Negative.   Negative for chest pain.  Gastrointestinal: Negative.  Negative for abdominal distention, abdominal pain and constipation.  Endocrine: Negative.  Negative for cold intolerance and heat intolerance.  Genitourinary: Negative.  Negative for dysuria.  Musculoskeletal: Negative.   Skin: Negative.   Neurological: Negative.   Psychiatric/Behavioral: Negative.     Allergies as of 04/27/2019   No Known Allergies     Medication List       Accurate as of April 27, 2019 11:59 PM. If you have any questions, ask your nurse or doctor.        STOP taking these medications   cephALEXin 500 MG capsule Commonly known as: Keflex Stopped by: Terald Sleeper, PA-C     TAKE these medications   acetaminophen 500 MG tablet Commonly known as: TYLENOL Take 500-1,000 mg by mouth every 6 (six) hours as needed.   ALPRAZolam 0.25 MG tablet Commonly known as: XANAX Take 1 tablet (0.25 mg total) by mouth daily as needed for anxiety.   aspirin EC 325 MG tablet Take 325 mg by mouth daily.   busPIRone 15 MG tablet Commonly known as: BUSPAR TAKE 1 TABLET BY MOUTH 3 TIMES DAILY   Co Q10 100 MG Caps Take 1 capsule  by mouth daily.   DULoxetine 30 MG capsule Commonly known as: CYMBALTA Take 2 capsules (60 mg total) by mouth daily.   losartan-hydrochlorothiazide 100-25 MG tablet Commonly known as: HYZAAR Take 1 tablet by mouth daily.   metoprolol tartrate 25 MG tablet Commonly known as: LOPRESSOR TAKE 1 TABLET BY MOUTH 2 TIMES DAILY.   nitroGLYCERIN 0.4 MG SL tablet Commonly known as: Nitrostat 1 TABLET UNDER TONGUE EVERY 5 MINUTES UP TO 3 TIMES FOR CHEST PAIN THEN CALL DR IF NO RELIEF What changed:   how much to take  how to take this  when to take this  reasons to take this   risperiDONE 0.5 MG tablet Commonly known as: RISPERDAL TAKE 1 TABLET BY MOUTH AT BEDTIME   rosuvastatin 10 MG tablet Commonly known as: CRESTOR Take 1 tablet (10 mg total) by mouth daily.   terbinafine  250 MG tablet Commonly known as: LAMISIL Take 1 tablet (250 mg total) by mouth daily.   traZODone 50 MG tablet Commonly known as: DESYREL Take 1-2 tablets (50-100 mg total) by mouth at bedtime as needed for sleep.   Vascepa 1 g Caps Generic drug: Icosapent Ethyl TAKE 2 CAPULES BY MOUTH TWICE DAILY What changed: See the new instructions.   vitamin C 100 MG tablet Take 100 mg by mouth daily.   Vitamin D (Ergocalciferol) 1.25 MG (50000 UT) Caps capsule Commonly known as: DRISDOL Take 1 capsule (50,000 Units total) by mouth every 7 (seven) days. What changed: when to take this   Zinc 50 MG Caps Take by mouth.          Objective:    BP 116/62   Pulse 69   Temp (!) 97.5 F (36.4 C) (Temporal)   Ht _0  (1.575 m)   Wt 185 lb (83.9 kg)   LMP 12/31/2013   SpO2 97%   BMI 33.84 kg/m   No Known Allergies  Wt Readings from Last 3 Encounters:  04/27/19 185 lb (83.9 kg)  02/21/19 182 lb (82.6 kg)  02/14/19 198 lb 6.6 oz (90 kg)    Physical Exam Constitutional:      General: She is not in acute distress.    Appearance: Normal appearance. She is well-developed.  HENT:     Head: Normocephalic and atraumatic.  Cardiovascular:     Rate and Rhythm: Normal rate.  Pulmonary:     Effort: Pulmonary effort is normal.  Skin:    General: Skin is warm and dry.     Findings: No rash.  Neurological:     Mental Status: She is alert and oriented to person, place, and time.     Deep Tendon Reflexes: Reflexes are normal and symmetric.     Results for orders placed or performed in visit on 04/27/19  CBC with Differential/Platelet  Result Value Ref Range   WBC 10.3 3.4 - 10.8 x10E3/uL   RBC 5.06 3.77 - 5.28 x10E6/uL   Hemoglobin 13.6 11.1 - 15.9 g/dL   Hematocrit 42.8 34.0 - 46.6 %   MCV 85 79 - 97 fL   MCH 26.9 26.6 - 33.0 pg   MCHC 31.8 31.5 - 35.7 g/dL   RDW 14.6 11.7 - 15.4 %   Platelets 235 150 - 450 x10E3/uL   Neutrophils 59 Not Estab. %   Lymphs 30 Not Estab. %    Monocytes 6 Not Estab. %   Eos 3 Not Estab. %   Basos 1 Not Estab. %   Neutrophils Absolute 6.2 1.4 -  7.0 x10E3/uL   Lymphocytes Absolute 3.1 0.7 - 3.1 x10E3/uL   Monocytes Absolute 0.6 0.1 - 0.9 x10E3/uL   EOS (ABSOLUTE) 0.3 0.0 - 0.4 x10E3/uL   Basophils Absolute 0.1 0.0 - 0.2 x10E3/uL   Immature Granulocytes 1 Not Estab. %   Immature Grans (Abs) 0.1 0.0 - 0.1 x10E3/uL  CMP14+EGFR  Result Value Ref Range   Glucose 105 (H) 65 - 99 mg/dL   BUN 15 6 - 24 mg/dL   Creatinine, Ser 0.57 0.57 - 1.00 mg/dL   GFR calc non Af Amer 103 >59 mL/min/1.73   GFR calc Af Amer 118 >59 mL/min/1.73   BUN/Creatinine Ratio 26 (H) 9 - 23   Sodium 141 134 - 144 mmol/L   Potassium 3.6 3.5 - 5.2 mmol/L   Chloride 99 96 - 106 mmol/L   CO2 28 20 - 29 mmol/L   Calcium 9.7 8.7 - 10.2 mg/dL   Total Protein 6.7 6.0 - 8.5 g/dL   Albumin 4.0 3.8 - 4.9 g/dL   Globulin, Total 2.7 1.5 - 4.5 g/dL   Albumin/Globulin Ratio 1.5 1.2 - 2.2   Bilirubin Total <0.2 0.0 - 1.2 mg/dL   Alkaline Phosphatase 73 39 - 117 IU/L   AST 11 0 - 40 IU/L   ALT 8 0 - 32 IU/L  Thyroid Panel With TSH  Result Value Ref Range   TSH 1.530 0.450 - 4.500 uIU/mL   T4, Total 7.2 4.5 - 12.0 ug/dL   T3 Uptake Ratio 34 24 - 39 %   Free Thyroxine Index 2.4 1.2 - 4.9  Vitamin B12  Result Value Ref Range   Vitamin B-12 311 232 - 1,245 pg/mL  VITAMIN D 25 Hydroxy (Vit-D Deficiency, Fractures)  Result Value Ref Range   Vit D, 25-Hydroxy 61.8 30.0 - 100.0 ng/mL  Magnesium  Result Value Ref Range   Magnesium 1.9 1.6 - 2.3 mg/dL  Iron  Result Value Ref Range   Iron 42 27 - 159 ug/dL      Assessment & Plan:   1. Well adult exam - CBC with Differential/Platelet - CMP14+EGFR - Thyroid Panel With TSH  2. Hair loss - Thyroid Panel With TSH - Vitamin B12 - VITAMIN D 25 Hydroxy (Vit-D Deficiency, Fractures) - Magnesium - Iron  3. Colostomy status ([place]) - CBC with Differential/Platelet - CMP14+EGFR - Thyroid Panel With TSH - Vitamin  B12 - VITAMIN D 25 Hydroxy (Vit-D Deficiency, Fractures) - Magnesium - Iron  4. S/P partial colectomy - CBC with Differential/Platelet - CMP14+EGFR - Thyroid Panel With TSH - Vitamin B12 - VITAMIN D 25 Hydroxy (Vit-D Deficiency, Fractures) - Magnesium - Iron  5. Myalgia - CBC with Differential/Platelet - CMP14+EGFR - Thyroid Panel With TSH - Vitamin B12 - VITAMIN D 25 Hydroxy (Vit-D Deficiency, Fractures) - Magnesium - Iron   Continue all other maintenance medications as listed above.  Follow up plan: No follow-ups on file.  Educational handout given for [place] PA-C [facility] [address]  [place], [place] [address] [phone]   04/29/2019, 10:24 PM

## 2019-05-14 ENCOUNTER — Other Ambulatory Visit: Payer: Self-pay

## 2019-05-14 ENCOUNTER — Ambulatory Visit (INDEPENDENT_AMBULATORY_CARE_PROVIDER_SITE_OTHER): Payer: No Typology Code available for payment source | Admitting: Physician Assistant

## 2019-05-14 ENCOUNTER — Encounter: Payer: Self-pay | Admitting: Physician Assistant

## 2019-05-14 VITALS — BP 138/74 | HR 64 | Temp 97.6°F | Ht 63.0 in | Wt 192.6 lb

## 2019-05-14 DIAGNOSIS — Z1159 Encounter for screening for other viral diseases: Secondary | ICD-10-CM | POA: Diagnosis not present

## 2019-05-14 DIAGNOSIS — Z8719 Personal history of other diseases of the digestive system: Secondary | ICD-10-CM

## 2019-05-14 DIAGNOSIS — Z933 Colostomy status: Secondary | ICD-10-CM

## 2019-05-14 MED ORDER — NA SULFATE-K SULFATE-MG SULF 17.5-3.13-1.6 GM/177ML PO SOLN: ORAL | 0 refills | Status: DC

## 2019-05-14 MED FILL — SUPREP BOWEL PREP KIT: 17.5-3.13-1 | 1 days supply | Qty: 354 | Fill #0

## 2019-05-14 NOTE — Progress Notes (Signed)
Chief Complaint: Discuss colonoscopy before colostomy reversal  HPI:    Heather Lam is a 58 year old Caucasian female with a past medical history as listed below including CAD status post stent placement in 2010, OSA on CPAP and recent hemicolectomy with colostomy, known to Dr. Hilarie Fredrickson, who was referred to me by Clovis Riley, MD to discuss colonoscopy before colostomy reversal.      10/26/2011 colonoscopy for history of diverticulitis and routine screening.  Mild diverticulosis in the left colon otherwise normal.  Repeat was recommended in 10 years.    01/26/2019-02/14/2019 patient admitted for continued abdominal pain after significant worsening from suspected diverticulitis not clearing with outpatient antibiotics.  In the ER CT scan of the abdomen pelvis showed acute complicated sigmoid diverticulitis with a 3.8 cm abscess adjacent to the sigmoid colon and a small to moderate volume of fluid within the pelvis with questionable rim enhancement.  General surgery was consulted and recommended IV antibiotics and IR drainage of the abscess and patient was admitted.  Patient had drain placement for the abscess 6/29 and white count continued to trend up.  Repeat CT 7/3 showed persistent acute left lower quadrant sigmoid diverticulitis with 2 pelvic abscesses, left lower quadrant abscess drain was retracted into the pericolonic fat.  Status post replacement of left lower quadrant drain and new pelvic drain 7/4.  Despite conservative measures continued with abdominal pain and worsening leukocytosis.  Repeat CT abdomen pelvis 02/08/2019 with thickening/fat stranding surrounding the sigmoid colon consistent with acute diverticulitis complicated by perforation and abscess.  Underwent exploratory laparotomy, lysis of inflammatory lesions, sigmoid colectomy with end colostomy and drain and incisional wound VAC placement on 02/09/2019 by Dr. Windle Guard.  Continued on Zosyn 5 days postoperative operatively with resolution of  leukocytosis.  She finally was discharged home and told to complete 5 additional days of antibiotics with Augmentin.    04/27/2019 CBC, CMP, thyroid panel, vitamin B12, vitamin D, iron and magnesium are all normal.    Today, the patient tells me that she had complicated diverticulitis as above and now they are going to try and reverse her colostomy but she needs to have a colonoscopy first to assure that everything is "well-healed".  Patient denies any current problems.  No GI complaints.    Denies fever, chills, weight loss, anorexia, nausea, vomiting, heartburn, reflux or symptoms that awaken her from sleep.  Past Medical History:  Diagnosis Date  . Anxiety   . Back pain   . CAD (coronary artery disease)   . Chest pain   . Complication of anesthesia   . Constipation   . Diverticulitis   . Diverticulosis   . Heart attack ([facility]) 2010  . History of kidney stones   . Hyperlipemia   . Hypertension   . Joint pain   . PONV (postoperative nausea and vomiting)   . Skin cancer    on nose  . Sleep apnea    wears CPAP nightly  . Vitamin D deficiency     Past Surgical History:  Procedure Laterality Date  . APPENDECTOMY    . APPLICATION OF WOUND VAC N/A 02/09/2019   Procedure: APPLICATION OF WOUND VAC;  Surgeon: Clovis Riley, MD;  Location: [facility];  Service: General;  Laterality: N/A;  . BACK SURGERY    . CARPAL TUNNEL RELEASE Right   . CARPAL TUNNEL RELEASE Left 08/13/2016   Procedure: LEFT CARPAL TUNNEL RELEASE;  Surgeon: Roseanne Kaufman, MD;  Location: [facility];  Service: Orthopedics;  Laterality: Left;  . COLECTOMY WITH COLOSTOMY CREATION/HARTMANN PROCEDURE N/A 02/09/2019   Procedure: HARTMANN PROCEDURE;  Surgeon: Clovis Riley, MD;  Location: [facility];  Service: General;  Laterality: N/A;  . COLOSTOMY N/A 02/09/2019   Procedure: COLOSTOMY;  Surgeon: Clovis Riley, MD;  Location: [facility];  Service: General;  Laterality: N/A;  . CORONARY STENT PLACEMENT     2 stents  in the same artery  . ENDOMETRIAL ABLATION  2005  . EXTRACORPOREAL SHOCK WAVE LITHOTRIPSY Right 06/27/2017   Procedure: RIGHT EXTRACORPOREAL SHOCK WAVE LITHOTRIPSY (ESWL);  Surgeon: Franchot Gallo, MD;  Location: [facility];  Service: Urology;  Laterality: Right;  . MOHS SURGERY    . TUBAL LIGATION  1991    Current Outpatient Medications  Medication Sig Dispense Refill  . acetaminophen (TYLENOL) 500 MG tablet Take 500-1,000 mg by mouth every 6 (six) hours as needed.    . ALPRAZolam (XANAX) 0.25 MG tablet Take 1 tablet (0.25 mg total) by mouth daily as needed for anxiety. 30 tablet 0  . Ascorbic Acid (VITAMIN C) 100 MG tablet Take 100 mg by mouth daily.    [name] aspirin EC 325 MG tablet Take 325 mg by mouth daily.    . Biotin 1 MG CAPS Take by mouth.    . busPIRone (BUSPAR) 15 MG tablet TAKE 1 TABLET BY MOUTH 3 TIMES DAILY 60 tablet 5  . Coenzyme Q10 (CO Q10) 100 MG CAPS Take 1 capsule by mouth daily.    . DULoxetine (CYMBALTA) 30 MG capsule Take 2 capsules (60 mg total) by mouth daily. 180 capsule 1  . loratadine (CLARITIN) 10 MG tablet Take 10 mg by mouth daily.    [name] losartan-hydrochlorothiazide (HYZAAR) 100-25 MG tablet Take 1 tablet by mouth daily. 90 tablet 2  . metoprolol tartrate (LOPRESSOR) 25 MG tablet TAKE 1 TABLET BY MOUTH 2 TIMES DAILY. (Patient taking differently: Take 25 mg by mouth 2 (two) times daily. ) 180 tablet 3  . nitroGLYCERIN (NITROSTAT) 0.4 MG SL tablet 1 TABLET UNDER TONGUE EVERY 5 MINUTES UP TO 3 TIMES FOR CHEST PAIN THEN CALL DR IF NO RELIEF (Patient taking differently: Place 0.4 mg under the tongue every 5 (five) minutes as needed for chest pain. 1 TABLET UNDER TONGUE EVERY 5 MINUTES UP TO 3 TIMES FOR CHEST PAIN THEN CALL DR IF NO RELIEF) 25 tablet 3  . risperiDONE (RISPERDAL) 0.5 MG tablet TAKE 1 TABLET BY MOUTH AT BEDTIME 90 tablet 1  . rosuvastatin (CRESTOR) 10 MG tablet Take 1 tablet (10 mg total) by mouth daily. 90 tablet 1  . terbinafine (LAMISIL) 250 MG tablet  Take 1 tablet (250 mg total) by mouth daily. 30 tablet 5  . traZODone (DESYREL) 50 MG tablet Take 1-2 tablets (50-100 mg total) by mouth at bedtime as needed for sleep. 90 tablet 3  . VASCEPA 1 g CAPS TAKE 2 CAPULES BY MOUTH TWICE DAILY (Patient taking differently: Take 2 capsules by mouth 2 (two) times a day. ) 360 capsule 2  . Vitamin D, Ergocalciferol, (DRISDOL) 1.25 MG (50000 UT) CAPS capsule Take 1 capsule (50,000 Units total) by mouth every 7 (seven) days. (Patient taking differently: Take 50,000 Units by mouth every Sunday. ) 12 capsule 3  . Zinc 50 MG CAPS Take by mouth.     No current facility-administered medications for this visit.     Allergies as of 05/14/2019  . (No Known Allergies)    Family History  Problem Relation Age of Onset  .  Hyperlipidemia Mother   . Hypertension Mother   . Cancer Father        skin  . Hyperlipidemia Father   . Dementia Father   . Arthritis Father   . Hypertension Father   . Sleep apnea Father   . Other Sister        Myelofibrosis  . Arthritis Sister   . Hyperlipidemia Sister   . Arthritis Sister   . Hyperlipidemia Sister   . Diabetes Paternal Grandfather   . Colon cancer Neg Hx     Social History   Socioeconomic History  . Marital status: Single    Spouse name: Not on file  . Number of children: Not on file  . Years of education: Not on file  . Highest education level: Not on file  Occupational History  . 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 Financial resource strain: Not on file  . Food insecurity    Worry: Not on file    Inability: Not on file  . Transportation needs    Medical: Not on file    Non-medical: Not on file  Tobacco Use  . Smoking status: Former Research scientist (life sciences)  . Smokeless tobacco: Never Used  Substance and Sexual Activity  . Alcohol use: Yes    Comment: Rare   . Drug use: No  . Sexual activity: Yes  Lifestyle  . Physical  activity    Days per week: Not on file    Minutes per session: Not on file  . Stress: Not on file  Relationships  . Social Herbalist on phone: Not on file    Gets together: Not on file    Attends religious service: Not on file    Active member of club or organization: Not on file    Attends meetings of clubs or organizations: Not on file    Relationship status: Not on file  . Intimate partner violence    Fear of current or ex partner: Not on file    Emotionally abused: Not on file    Physically abused: Not on file    Forced sexual activity: Not on file  Other Topics Concern  . Not on file  Social History Narrative   Caffeine daily     Review of Systems:    Constitutional: No weight loss, fever or chills Skin: No rash  Cardiovascular: No chest pain Respiratory: No SOB  Gastrointestinal: See HPI and otherwise negative Genitourinary: No dysuria  Neurological: No headache, dizziness or syncope Musculoskeletal: No new muscle or joint pain Hematologic: No bleeding  Psychiatric: No history of depression or anxiety   Physical Exam:  Vital signs: BP 138/74   Pulse 64   Temp 97.6 F (36.4 C)   Ht 5\' 3"  (1.6 m)   Wt 192 lb 9.6 oz (87.4 kg)   LMP 12/31/2013   BMI 34.12 kg/m    Constitutional:   Pleasant Caucasian female appears to be in NAD, Well developed, Well nourished, alert and cooperative Head:  Normocephalic and atraumatic. Eyes:   PEERL, EOMI. No icterus. Conjunctiva pink. Ears:  Normal auditory acuity. Neck:  Supple Throat: Oral cavity and pharynx without inflammation, swelling or lesion.  Respiratory: Respirations even and unlabored. Lungs clear to auscultation bilaterally.   No wheezes, crackles, or rhonchi.  Cardiovascular: Normal S1, S2. No MRG. Regular rate and rhythm. No peripheral edema, cyanosis or pallor.  Gastrointestinal:  Soft, nondistended, nontender. No rebound or guarding. Normal bowel sounds. No appreciable masses or hepatomegaly.  +midline healed incisional scar extending around the umbilicus, +colostomy left side of abdomen with brown stool Rectal:  Not performed.  Msk:  Symmetrical without gross deformities. Without edema, no deformity or joint abnormality.  Neurologic:  Alert and  oriented x4;  grossly normal neurologically.  Skin:   Dry and intact without significant lesions or rashes. Psychiatric:  Demonstrates good judgement and reason without abnormal affect or behaviors.  RELEVANT LABS AND IMAGING: CBC    Component Value Date/Time   WBC 10.3 04/27/2019 1535   WBC 9.0 02/14/2019 0418   RBC 5.06 04/27/2019 1535   RBC 3.92 02/14/2019 0418   HGB 13.6 04/27/2019 1535   HCT 42.8 04/27/2019 1535   PLT 235 04/27/2019 1535   MCV 85 04/27/2019 1535   MCH 26.9 04/27/2019 1535   MCH 27.6 02/14/2019 0418   MCHC 31.8 04/27/2019 1535   MCHC 31.7 02/14/2019 0418   RDW 14.6 04/27/2019 1535   LYMPHSABS 3.1 04/27/2019 1535   MONOABS 0.9 01/30/2019 0224   EOSABS 0.3 04/27/2019 1535   BASOSABS 0.1 04/27/2019 1535    CMP     Component Value Date/Time   NA 141 04/27/2019 1535   K 3.6 04/27/2019 1535   CL 99 04/27/2019 1535   CO2 28 04/27/2019 1535   GLUCOSE 105 (H) 04/27/2019 1535   GLUCOSE 102 (H) 02/14/2019 0418   BUN 15 04/27/2019 1535   CREATININE 0.57 04/27/2019 1535   CALCIUM 9.7 04/27/2019 1535   PROT 6.7 04/27/2019 1535   ALBUMIN 4.0 04/27/2019 1535   AST 11 04/27/2019 1535   ALT 8 04/27/2019 1535   ALKPHOS 73 04/27/2019 1535   BILITOT <0.2 04/27/2019 1535   GFRNONAA 103 04/27/2019 1535   GFRAA 118 04/27/2019 1535    Assessment: 1.  Status post colostomy for complicated diverticulitis: This was done in July, surgeons are now wanting to reverse colostomy but want to make sure that everything is healed, she is needing a colonoscopy  Plan: 1.  Scheduled patient for colonoscopy in the [facility] with Dr. Hilarie Fredrickson.  Did discuss risks, benefits, limitations and alternatives and patient agrees to proceed. 2.   Patient to follow in clinic per recommendations from Dr. Hilarie Fredrickson after time of procedure.  Ellouise Newer, PA-C [facility] 05/14/2019, 10:38 AM  Cc: Clovis Riley, MD

## 2019-05-14 NOTE — Patient Instructions (Addendum)
If you are age 58 or older, your body mass index should be between 23-30. Your Body mass index is 34.12 kg/m. If this is out of the aforementioned range listed, please consider follow up with your Primary Care Provider.  If you are age 98 or younger, your body mass index should be between 19-25. Your Body mass index is 34.12 kg/m. If this is out of the aformentioned range listed, please consider follow up with your Primary Care Provider.   You have been scheduled for a colonoscopy. Please follow written instructions given to you at your visit today.  Please pick up your prep supplies at the pharmacy within the next 1-3 days. If you use inhalers (even only as needed), please bring them with you on the day of your procedure. Your physician has requested that you go to www.startemmi.com and enter the access code given to you at your visit today. This web site gives a general overview about your procedure. However, you should still follow specific instructions given to you by our office regarding your preparation for the procedure.  We have sent the following medications to your pharmacy for you to pick up at your convenience: Suprep  Due to recent COVID-19 restrictions implemented by [employer] and state authorities and in an effort to keep both patients and staff as safe as possible, [facility] requires COVID-19 testing prior to any scheduled endoscopic procedure. The testing center is located at [address] [place], [place] [address].  Testing will be performed Monday through Friday 8 am- 4 pm. YOUR APPT IS ON 06/06/19 AT 8 AM  You will require your COVID screen 2 days prior to your endoscopic procedure.  You are not required to quarantine after your screening.  You will only receive a phone call with the results if it is POSITIVE.  If you do not receive a call the day before your procedure you should begin your prep, if ordered, and you should report to the  endo center for your procedure at your designated appointment arrival time ( one hour prior to the procedure time). There is no cost to you for the screening on the day of the swab.  [facility] will file with your insurance company for the testing.    You may receive an automated phone call prior to your procedure or have a message in your MyChart that you have an appointment for a BP/15 at the [facility], please disregard this message.  Your testing will be at the [facility] location.    Thank you for choosing me and [facility].    Ellouise Newer, PA-C

## 2019-05-18 NOTE — Progress Notes (Signed)
Addendum: Reviewed and agree with assessment and management plan. Patrisha Hausmann M, MD  

## 2019-05-21 ENCOUNTER — Other Ambulatory Visit: Payer: Self-pay | Admitting: *Deleted

## 2019-05-21 DIAGNOSIS — F3289 Other specified depressive episodes: Secondary | ICD-10-CM

## 2019-05-21 DIAGNOSIS — F411 Generalized anxiety disorder: Secondary | ICD-10-CM

## 2019-05-21 MED ORDER — BUSPIRONE HCL 15 MG PO TABS: 15.0000 mg | ORAL_TABLET | Freq: Three times a day (TID) | ORAL | 5 refills | Status: DC

## 2019-05-21 MED ORDER — LOSARTAN POTASSIUM-HCTZ 100-25 MG PO TABS: 1.0000 | ORAL_TABLET | Freq: Every day | ORAL | 2 refills | Status: DC

## 2019-05-21 MED FILL — LOSARTAN-HCTZ 100-25 MG TAB: 100-25 | 30 days supply | Qty: 30 | Fill #0

## 2019-05-21 MED FILL — busPIRone HCL 15 MG TABS: 15 | 30 days supply | Qty: 90 | Fill #0

## 2019-05-27 MED FILL — METOPROLOL TARTRATE 25 MG T: 25 | 90 days supply | Qty: 180 | Fill #2

## 2019-05-28 ENCOUNTER — Other Ambulatory Visit: Payer: Self-pay | Admitting: Physician Assistant

## 2019-05-28 MED FILL — VASCEPA 1 GM CAPSULE: 1 | 90 days supply | Qty: 360 | Fill #0

## 2019-05-31 ENCOUNTER — Other Ambulatory Visit: Payer: Self-pay

## 2019-06-01 ENCOUNTER — Ambulatory Visit: Payer: No Typology Code available for payment source | Admitting: Physician Assistant

## 2019-06-06 ENCOUNTER — Encounter: Payer: Self-pay | Admitting: Internal Medicine

## 2019-06-06 ENCOUNTER — Other Ambulatory Visit: Payer: Self-pay | Admitting: Internal Medicine

## 2019-06-06 LAB — SARS CORONAVIRUS 2 (TAT 6-24 HRS): SARS Coronavirus 2: NEGATIVE

## 2019-06-08 ENCOUNTER — Ambulatory Visit (AMBULATORY_SURGERY_CENTER): Payer: No Typology Code available for payment source | Admitting: Internal Medicine

## 2019-06-08 ENCOUNTER — Other Ambulatory Visit: Payer: Self-pay

## 2019-06-08 ENCOUNTER — Encounter: Payer: Self-pay | Admitting: Internal Medicine

## 2019-06-08 VITALS — BP 144/77 | HR 66 | Temp 98.2°F | Resp 20 | Ht 63.0 in | Wt 192.0 lb

## 2019-06-08 DIAGNOSIS — K635 Polyp of colon: Secondary | ICD-10-CM

## 2019-06-08 DIAGNOSIS — Z8719 Personal history of other diseases of the digestive system: Secondary | ICD-10-CM

## 2019-06-08 DIAGNOSIS — D120 Benign neoplasm of cecum: Secondary | ICD-10-CM

## 2019-06-08 DIAGNOSIS — K5730 Diverticulosis of large intestine without perforation or abscess without bleeding: Secondary | ICD-10-CM

## 2019-06-08 DIAGNOSIS — Z933 Colostomy status: Secondary | ICD-10-CM | POA: Diagnosis not present

## 2019-06-08 MED ORDER — SODIUM CHLORIDE 0.9 % IV SOLN: 500.0000 mL | INTRAVENOUS | Status: DC

## 2019-06-08 NOTE — Progress Notes (Signed)
Called to room to assist during endoscopic procedure.  Patient ID and intended procedure confirmed with present staff. Received instructions for my participation in the procedure from the performing physician.  

## 2019-06-08 NOTE — Op Note (Signed)
[facility] Patient Name: Heather Lam Procedure Date: 06/08/2019 3:04 PM MRN: [id number] Endoscopist: Jerene Bears , MD Age: 58 Referring MD:  Date of Birth: November 19, 1960 Gender: Female Account #: [id number] Procedure:                Colonoscopy Indications:              Follow-up of complicated diverticulitis s/p                            sigmoidectomy with end colostomy July 2020, repeat                            colonoscopy prior to ostomy takedown with                            reanastomosis Medicines:                Monitored Anesthesia Care Procedure:                After obtaining informed consent, the colonoscope                            was passed under direct vision. Throughout the                            procedure, the patient's blood pressure, pulse, and                            oxygen saturations were monitored continuously. The                            Colonoscope was introduced through the descending                            colostomy and advanced to the cecum, identified by                            appendiceal orifice and ileocecal valve. Scope In: 3:14:38 PM Scope Out: 3:31:28 PM Scope Withdrawal Time: 0 hours 15 minutes 26 seconds  Total Procedure Duration: 0 hours 16 minutes 50 seconds  Findings:                 There was evidence of a widely patent end ileostomy                            in the descending colon. This was characterized by                            healthy appearing mucosa.                           Two sessile polyps were found in the cecum. The                            polyps were 1 to 2 mm in size. These polyps were  removed with a cold biopsy forceps. Resection and                            retrieval were complete.                           Multiple small-mouthed diverticula were found in                            the descending colon.                           The exam was otherwise  normal throughout the                            examined colon via the ostomy.                           The digital rectal exam was normal.                           There was evidence of a prior surgical intervention                            in the rectosigmoid colon. The rectum/rectosigmoid                            was examined to the rectosigmoid cuff.                           The rectum and recto-sigmoid colon appeared normal.                            There was inspissated mucous in the proximal rectum                            which could not be cleared. This interfered with                            visualization in this area.                           Retroflexion in the rectum was not performed due to                            post-surgical anatomy. Complications:            No immediate complications. Estimated Blood Loss:     Estimated blood loss: none. Impression:               - Widely patent end ileostomy with healthy                            appearing mucosa in the descending colon.                           -  Two 1 to 2 mm polyps in the cecum, removed with a                            cold biopsy forceps. Resected and retrieved.                           - Diverticulosis in the descending colon.                           - Rectosigmoid cuff.                           - The rectum and recto-sigmoid colon appeared                            normal. Recommendation:           - Patient has a contact number available for                            emergencies. The signs and symptoms of potential                            delayed complications were discussed with the                            patient. Return to normal activities tomorrow.                            Written discharge instructions were provided to the                            patient.                           - Resume previous diet.                           - Continue present medications.                            - Await pathology results.                           - Repeat colonoscopy is recommended. The                            colonoscopy date will be determined after pathology                            results from today's exam become available for                            review. Jerene Bears, MD 06/08/2019 3:47:07 PM This report has been signed electronically.

## 2019-06-08 NOTE — Patient Instructions (Signed)
2 POLYPS AND DIVERTICULOSIS. PLEASE READ HANDOUTS.     YOU HAD AN ENDOSCOPIC PROCEDURE TODAY AT [facility]:   Refer to the procedure report that was given to you for any specific questions about what was found during the examination.  If the procedure report does not answer your questions, please call your gastroenterologist to clarify.  If you requested that your care partner not be given the details of your procedure findings, then the procedure report has been included in a sealed envelope for you to review at your convenience later.  YOU SHOULD EXPECT: Some feelings of bloating in the abdomen. Passage of more gas than usual.  Walking can help get rid of the air that was put into your GI tract during the procedure and reduce the bloating. If you had a lower endoscopy (such as a colonoscopy or flexible sigmoidoscopy) you may notice spotting of blood in your stool or on the toilet paper. If you underwent a bowel prep for your procedure, you may not have a normal bowel movement for a few days.  Please Note:  You might notice some irritation and congestion in your nose or some drainage.  This is from the oxygen used during your procedure.  There is no need for concern and it should clear up in a day or so.  SYMPTOMS TO REPORT IMMEDIATELY:   Following lower endoscopy (colonoscopy or flexible sigmoidoscopy):  Excessive amounts of blood in the stool  Significant tenderness or worsening of abdominal pains  Swelling of the abdomen that is new, acute  Fever of 100F or higher   For urgent or emergent issues, a gastroenterologist can be reached at any hour by calling [phone].   DIET:  We do recommend a small meal at first, but then you may proceed to your regular diet.  Drink plenty of fluids but you should avoid alcoholic beverages for 24 hours.  ACTIVITY:  You should plan to take it easy for the rest of today and you should NOT DRIVE or use heavy machinery until tomorrow  (because of the sedation medicines used during the test).    FOLLOW UP: Our staff will call the number listed on your records 48-72 hours following your procedure to check on you and address any questions or concerns that you may have regarding the information given to you following your procedure. If we do not reach you, we will leave a message.  We will attempt to reach you two times.  During this call, we will ask if you have developed any symptoms of COVID 19. If you develop any symptoms (ie: fever, flu-like symptoms, shortness of breath, cough etc.) before then, please call [phone].  If you test positive for Covid 19 in the 2 weeks post procedure, please call and report this information to [place].    If any biopsies were taken you will be contacted by phone or by letter within the next 1-3 weeks.  Please call us at [phone] if you have not heard about the biopsies in 3 weeks.    SIGNATURES/CONFIDENTIALITY: You and/or your care partner have signed paperwork which will be entered into your electronic medical record.  These signatures attest to the fact that that the information above on your After Visit Summary has been reviewed and is understood.  Full responsibility of the confidentiality of this discharge information lies with you and/or your care-partner.

## 2019-06-08 NOTE — Progress Notes (Signed)
Temp JB  v/S CW I have reviewed the patient's medical history in detail and updated the computerized patient record.

## 2019-06-08 NOTE — Progress Notes (Signed)
Pt tolerated well. VSS. Pt arousable. To rcovery.

## 2019-06-09 ENCOUNTER — Ambulatory Visit: Payer: Self-pay | Admitting: Surgery

## 2019-06-12 ENCOUNTER — Telehealth: Payer: Self-pay | Admitting: *Deleted

## 2019-06-12 ENCOUNTER — Telehealth: Payer: Self-pay

## 2019-06-12 ENCOUNTER — Other Ambulatory Visit: Payer: Self-pay | Admitting: Physician Assistant

## 2019-06-12 MED ORDER — FLUCONAZOLE 150 MG PO TABS: ORAL_TABLET | ORAL | 0 refills | Status: DC

## 2019-06-12 MED FILL — NEOMYCIN 500 MG TABLET: 500 | 1 days supply | Qty: 6 | Fill #0

## 2019-06-12 MED FILL — METRONIDAZOLE 500 MG TABS: 500 | 1 days supply | Qty: 6 | Fill #0

## 2019-06-12 NOTE — Telephone Encounter (Signed)
Follow up call attempted.  NALM  

## 2019-06-12 NOTE — Telephone Encounter (Signed)
Patient has yeast infection under the fold of her skin. Can we please send in rx for diflucan where she can take one for a couple of days?

## 2019-06-12 NOTE — Telephone Encounter (Signed)
Patient aware.

## 2019-06-12 NOTE — Progress Notes (Signed)
flucan

## 2019-06-12 NOTE — Telephone Encounter (Signed)
2nd follow up call made.  NALM 

## 2019-06-12 NOTE — Telephone Encounter (Signed)
sent 

## 2019-06-17 MED FILL — VIT D2 1.25 MG (50,000 UNIT: 1.25 MG | 84 days supply | Qty: 12 | Fill #3

## 2019-06-17 MED FILL — LOSARTAN-HCTZ 100-25 MG TAB: 100-25 | 30 days supply | Qty: 30 | Fill #1

## 2019-06-18 ENCOUNTER — Encounter: Payer: Self-pay | Admitting: Internal Medicine

## 2019-06-19 ENCOUNTER — Telehealth: Payer: Self-pay | Admitting: *Deleted

## 2019-06-19 ENCOUNTER — Ambulatory Visit (INDEPENDENT_AMBULATORY_CARE_PROVIDER_SITE_OTHER): Payer: No Typology Code available for payment source | Admitting: *Deleted

## 2019-06-19 ENCOUNTER — Other Ambulatory Visit: Payer: Self-pay | Admitting: Physician Assistant

## 2019-06-19 DIAGNOSIS — D510 Vitamin B12 deficiency anemia due to intrinsic factor deficiency: Secondary | ICD-10-CM

## 2019-06-19 MED ORDER — CYANOCOBALAMIN 1000 MCG/ML IJ SOLN
1000.0000 ug | INTRAMUSCULAR | Status: AC
  Administered 2019-06-19 – 2019-07-13 (×4): 1000 ug via INTRAMUSCULAR

## 2019-06-19 NOTE — Telephone Encounter (Signed)
Patient aware.

## 2019-06-19 NOTE — Telephone Encounter (Signed)
Verbal order for vit B 12 1000 mg one injection weekly 1 month, then 1 monthly.  Dx: pernicious anemia, colon resection, malabsorption.

## 2019-06-19 NOTE — Progress Notes (Signed)
Vitamin b12 injection given and patient tolerated well.  

## 2019-06-19 NOTE — Telephone Encounter (Signed)
Patient states she cannot get over this overwhelming sense of fatigue all the time.  it doesn't matter how much she rest or how early she goes to bed, she is constantly so tired, just a worn out feeling all the time.  She is wondering if Glenard Haring would let me start b12 injections to see if it would help.

## 2019-06-26 ENCOUNTER — Other Ambulatory Visit: Payer: Self-pay

## 2019-06-26 ENCOUNTER — Ambulatory Visit (INDEPENDENT_AMBULATORY_CARE_PROVIDER_SITE_OTHER): Payer: No Typology Code available for payment source | Admitting: *Deleted

## 2019-06-26 DIAGNOSIS — D510 Vitamin B12 deficiency anemia due to intrinsic factor deficiency: Secondary | ICD-10-CM | POA: Diagnosis not present

## 2019-07-04 ENCOUNTER — Ambulatory Visit (INDEPENDENT_AMBULATORY_CARE_PROVIDER_SITE_OTHER): Payer: No Typology Code available for payment source | Admitting: *Deleted

## 2019-07-04 DIAGNOSIS — D510 Vitamin B12 deficiency anemia due to intrinsic factor deficiency: Secondary | ICD-10-CM | POA: Diagnosis not present

## 2019-07-08 MED FILL — busPIRone HCL 15 MG TABS: 15 | 30 days supply | Qty: 90 | Fill #1

## 2019-07-09 ENCOUNTER — Other Ambulatory Visit: Payer: Self-pay | Admitting: *Deleted

## 2019-07-09 MED ORDER — DULOXETINE HCL 30 MG PO CPEP: 60.0000 mg | ORAL_CAPSULE | Freq: Every day | ORAL | 0 refills | Status: DC

## 2019-07-09 MED FILL — DULoxetine HCL 30 MG CPEP: 30 | 90 days supply | Qty: 180 | Fill #0

## 2019-07-10 ENCOUNTER — Other Ambulatory Visit: Payer: Self-pay | Admitting: Physician Assistant

## 2019-07-10 MED FILL — traZODone HCL 50 MG TABS: 50 | 45 days supply | Qty: 90 | Fill #0

## 2019-07-13 ENCOUNTER — Encounter: Payer: Self-pay | Admitting: Physician Assistant

## 2019-07-13 ENCOUNTER — Ambulatory Visit (INDEPENDENT_AMBULATORY_CARE_PROVIDER_SITE_OTHER): Payer: No Typology Code available for payment source | Admitting: Physician Assistant

## 2019-07-13 VITALS — BP 139/72 | HR 57 | Temp 98.6°F | Resp 20 | Ht 63.0 in | Wt 192.0 lb

## 2019-07-13 DIAGNOSIS — M545 Low back pain, unspecified: Secondary | ICD-10-CM

## 2019-07-13 DIAGNOSIS — M5432 Sciatica, left side: Secondary | ICD-10-CM | POA: Diagnosis not present

## 2019-07-13 DIAGNOSIS — D510 Vitamin B12 deficiency anemia due to intrinsic factor deficiency: Secondary | ICD-10-CM

## 2019-07-13 MED ORDER — CYCLOBENZAPRINE HCL 10 MG PO TABS: 10.0000 mg | ORAL_TABLET | Freq: Three times a day (TID) | ORAL | 0 refills | Status: DC | PRN

## 2019-07-13 MED ORDER — PREDNISONE 10 MG (48) PO TBPK: ORAL_TABLET | ORAL | 0 refills | Status: DC

## 2019-07-13 NOTE — Patient Instructions (Signed)
Sacroiliac Joint Dysfunction  Sacroiliac joint dysfunction is a condition that causes inflammation on one or both sides of the sacroiliac (SI) joint. The SI joint connects the lower part of the spine (sacrum) with the two upper portions of the pelvis (ilium). This condition causes deep aching or burning pain in the low back. In some cases, the pain may also spread into one or both buttocks, hips, or thighs. What are the causes? This condition may be caused by:  Pregnancy. During pregnancy, extra stress is put on the SI joints because the pelvis widens.  Injury, such as: ? Injuries from car accidents. ? Sports-related injuries. ? Work-related injuries.  Having one leg that is shorter than the other.  Conditions that affect the joints, such as: ? Rheumatoid arthritis. ? Gout. ? Psoriatic arthritis. ? Joint infection (septic arthritis). Sometimes, the cause of SI joint dysfunction is not known. What are the signs or symptoms? Symptoms of this condition include:  Aching or burning pain in the lower back. The pain may also spread to other areas, such as: ? Buttocks. ? Groin. ? Thighs.  Muscle spasms in or around the painful areas.  Increased pain when standing, walking, running, stair climbing, bending, or lifting. How is this diagnosed? This condition is diagnosed with a physical exam and medical history. During the exam, the health care provider may move one or both of your legs to different positions to check for pain. Various tests may be done to confirm the diagnosis, including:  Imaging tests to look for other causes of pain. These may include: ? MRI. ? CT scan. ? Bone scan.  Diagnostic injection. A numbing medicine is injected into the SI joint using a needle. If your pain is temporarily improved or stopped after the injection, this can indicate that SI joint dysfunction is the problem. How is this treated? Treatment depends on the cause and severity of your condition.  Treatment options may include:  Ice or heat applied to the lower back area after an injury. This may help reduce pain and muscle spasms.  Medicines to relieve pain or inflammation or to relax the muscles.  Wearing a back brace (sacroiliac brace) to help support the joint while your back is healing.  Physical therapy to increase muscle strength around the joint and flexibility at the joint. This may also involve learning proper body positions and ways of moving to relieve stress on the joint.  Direct manipulation of the SI joint.  Injections of steroid medicine into the joint to reduce pain and swelling.  Radiofrequency ablation to burn away nerves that are carrying pain messages from the joint.  Use of a device that provides electrical stimulation to help reduce pain at the joint.  Surgery to put in screws and plates that limit or prevent joint motion. This is rare. Follow these instructions at home: Medicines  Take over-the-counter and prescription medicines only as told by your health care provider.  Do not drive or use heavy machinery while taking prescription pain medicine.  If you are taking prescription pain medicine, take actions to prevent or treat constipation. Your health care provider may recommend that you: ? Drink enough fluid to keep your urine pale yellow. ? Eat foods that are high in fiber, such as fresh fruits and vegetables, whole grains, and beans. ? Limit foods that are high in fat and processed sugars, such as fried or sweet foods. ? Take an over-the-counter or prescription medicine for constipation. If you have a brace:    Wear the brace as told by your health care provider. Remove it only as told by your health care provider.  Keep the brace clean.  If the brace is not waterproof: ? Do not let it get wet. ? Cover it with a watertight covering when you take a bath or a shower. Managing pain, stiffness, and swelling      Icing can help with pain and  swelling. Heat may help with muscle tension or spasms. Ask your health care provider if you should use ice or heat.  If directed, put ice on the affected area: ? If you have a removable brace, remove it as told by your health care provider. ? Put ice in a plastic bag. ? Place a towel between your skin and the bag. ? Leave the ice on for 20 minutes, 2-3 times a day.  If directed, apply heat to the affected area. Use the heat source that your health care provider recommends, such as a moist heat pack or a heating pad. ? Place a towel between your skin and the heat source. ? Leave the heat on for 20-30 minutes. ? Remove the heat if your skin turns bright red. This is especially important if you are unable to feel pain, heat, or cold. You may have a greater risk of getting burned. General instructions  Rest as needed. Ask your health care provider what activities are safe for you.  Return to your normal activities as told by your health care provider.  Exercise as directed by your health care provider or physical therapist.  Do not use any products that contain nicotine or tobacco, such as cigarettes and e-cigarettes. These can delay bone healing. If you need help quitting, ask your health care provider.  Keep all follow-up visits as told by your health care provider. This is important. Contact a health care provider if:  Your pain is not controlled with medicine.  You have a fever.  Your pain is getting worse. Get help right away if:  You have weakness, numbness, or tingling in your legs or feet.  You lose control of your bladder or bowel. Summary  Sacroiliac joint dysfunction is a condition that causes inflammation on one or both sides of the sacroiliac (SI) joint.  This condition causes deep aching or burning pain in the low back. In some cases, the pain may also spread into one or both buttocks, hips, or thighs.  Treatment depends on the cause and severity of your condition.  It may include medicines to reduce pain and swelling or to relax muscles. This information is not intended to replace advice given to you by your health care provider. Make sure you discuss any questions you have with your health care provider. Document Released: 10/15/2008 Document Revised: 03/15/2018 Document Reviewed: 08/29/2017 Elsevier Patient Education  2020 [employer].  

## 2019-07-16 ENCOUNTER — Other Ambulatory Visit (HOSPITAL_COMMUNITY): Payer: No Typology Code available for payment source

## 2019-07-17 ENCOUNTER — Telehealth: Payer: Self-pay | Admitting: *Deleted

## 2019-07-17 MED ORDER — CYANOCOBALAMIN 1000 MCG/ML IJ SOLN: 1000.0000 ug | INTRAMUSCULAR | 0 refills | Status: DC

## 2019-07-17 MED FILL — CYANOCOBALAMIN 1,000 MCG/ML: 1000 | 90 days supply | Qty: 3 | Fill #0

## 2019-07-17 NOTE — Telephone Encounter (Signed)
Patient requested rx for vitamin b12 sent to pharmacy. Rx sent.

## 2019-07-18 NOTE — Progress Notes (Signed)
BP 139/72   Pulse (!) 57   Temp 98.6 F (37 C)   Resp 20   Ht 5\' 3"  (1.6 m)   Wt 192 lb (87.1 kg)   LMP 12/31/2013   SpO2 96%   BMI 34.01 kg/m    Subjective:    Patient ID: Heather Lam, female    DOB: [birth year], 58 y.o.   MRN: [id number]  HPI: Heather Lam is a 58 y.o. female presenting on 07/13/2019 for Back Pain  Patient has had ongoing lumbar pain particularly over the left SI joint and with some radiation down the leg.  She denies any injury to the area.  It has not caused her to fall.  She is limited in what medications she is to take again due to her GI condition.  She is use muscle rubs, Tylenol without any relief.  Stress doing exercises and try medication.  And then if things do not improve the possibility of physical therapy.  She is given some back exercises to do starting in about a week.  Past Medical History:  Diagnosis Date  . Anxiety   . Back pain   . CAD (coronary artery disease)   . Chest pain   . Complication of anesthesia   . Constipation   . Diverticulitis   . Diverticulosis   . Heart attack ([place]) 2010  . History of kidney stones   . Hyperlipemia   . Hypertension   . Joint pain   . PONV (postoperative nausea and vomiting)   . Skin cancer    on nose  . Sleep apnea    wears CPAP nightly  . Vitamin D deficiency    Relevant past medical, surgical, family and social history reviewed and updated as indicated. Interim medical history since our last visit reviewed. Allergies and medications reviewed and updated. DATA REVIEWED: CHART IN EPIC  Family History reviewed for pertinent findings.  Review of Systems  Allergies as of 07/13/2019   No Known Allergies     Medication List       Accurate as of July 13, 2019 11:59 PM. If you have any questions, ask your nurse or doctor.        STOP taking these medications   fluconazole 150 MG tablet Commonly known as: Diflucan Stopped by: Terald Sleeper, PA-C   terbinafine 250 MG  tablet Commonly known as: LAMISIL Stopped by: Terald Sleeper, PA-C     TAKE these medications   acetaminophen 500 MG tablet Commonly known as: TYLENOL Take 500-1,000 mg by mouth every 6 (six) hours as needed for moderate pain.   ALPRAZolam 0.25 MG tablet Commonly known as: XANAX Take 1 tablet (0.25 mg total) by mouth daily as needed for anxiety.   aspirin EC 325 MG tablet Take 325 mg by mouth daily.   Biotin 1 MG Caps Take 1 capsule by mouth daily.   busPIRone 15 MG tablet Commonly known as: BUSPAR Take 1 tablet (15 mg total) by mouth 3 (three) times daily.   Co Q10 100 MG Caps Take 1 capsule by mouth daily.   cyclobenzaprine 10 MG tablet Commonly known as: FLEXERIL Take 1 tablet (10 mg total) by mouth 3 (three) times daily as needed for muscle spasms. Started by: Terald Sleeper, PA-C   DULoxetine 30 MG capsule Commonly known as: CYMBALTA Take 2 capsules (60 mg total) by mouth daily.   loratadine 10 MG tablet Commonly known as: CLARITIN Take 10 mg by mouth daily.  losartan-hydrochlorothiazide 100-25 MG tablet Commonly known as: HYZAAR Take 1 tablet by mouth daily.   metoprolol tartrate 25 MG tablet Commonly known as: LOPRESSOR TAKE 1 TABLET BY MOUTH 2 TIMES DAILY.   nitroGLYCERIN 0.4 MG SL tablet Commonly known as: Nitrostat 1 TABLET UNDER TONGUE EVERY 5 MINUTES UP TO 3 TIMES FOR CHEST PAIN THEN CALL DR IF NO RELIEF   risperiDONE 0.5 MG tablet Commonly known as: RISPERDAL TAKE 1 TABLET BY MOUTH AT BEDTIME   rosuvastatin 10 MG tablet Commonly known as: CRESTOR Take 1 tablet (10 mg total) by mouth daily.   traZODone 50 MG tablet Commonly known as: DESYREL TAKE 1-2 TABLETS BY MOUTH AT BEDTIME AS NEEDED FOR SLEEP.   Vascepa 1 g capsule Generic drug: icosapent Ethyl TAKE 2 CAPULES BY MOUTH TWICE DAILY   vitamin C 100 MG tablet Take 100 mg by mouth daily.   Vitamin D (Ergocalciferol) 1.25 MG (50000 UT) Caps capsule Commonly known as: DRISDOL Take 1  capsule (50,000 Units total) by mouth every 7 (seven) days. What changed: when to take this   Zinc 50 MG Caps Take 50 mg by mouth daily.          Objective:    BP 139/72   Pulse (!) 57   Temp 98.6 F (37 C)   Resp 20   Ht 5\' 3"  (1.6 m)   Wt 192 lb (87.1 kg)   LMP 12/31/2013   SpO2 96%   BMI 34.01 kg/m   No Known Allergies  Wt Readings from Last 3 Encounters:  07/13/19 192 lb (87.1 kg)  06/08/19 192 lb (87.1 kg)  05/14/19 192 lb 9.6 oz (87.4 kg)    Physical Exam Constitutional:      General: She is not in acute distress.    Appearance: Normal appearance. She is well-developed.  HENT:     Head: Normocephalic and atraumatic.  Cardiovascular:     Rate and Rhythm: Normal rate.  Pulmonary:     Effort: Pulmonary effort is normal.  Musculoskeletal:        General: Tenderness present. No signs of injury.     Lumbar back: Spasms and tenderness present. No edema. Decreased range of motion. No scoliosis.       Back:  Skin:    General: Skin is warm and dry.     Findings: No rash.  Neurological:     Mental Status: She is alert and oriented to person, place, and time.     Deep Tendon Reflexes: Reflexes are normal and symmetric.     Results for orders placed or performed in visit on 06/06/19  SARS Coronavirus 2 (TAT 6-24 hrs)  Result Value Ref Range   SARS Coronavirus 2 RESULT:  NEGATIVE       Assessment & Plan:   1. Lumbar pain - cyclobenzaprine (FLEXERIL) 10 MG tablet; Take 1 tablet (10 mg total) by mouth 3 (three) times daily as needed for muscle spasms.  Dispense: 60 tablet; Refill: 0  2. Left sided sciatica - cyclobenzaprine (FLEXERIL) 10 MG tablet; Take 1 tablet (10 mg total) by mouth 3 (three) times daily as needed for muscle spasms.  Dispense: 60 tablet; Refill: 0   Continue all other maintenance medications as listed above.  Follow up plan: No follow-ups on file.  Educational handout given for back exercises  Terald Sleeper PA-C [facility] [address]  [place], [place] [address] [phone]   07/18/2019, 12:55 PM

## 2019-07-19 ENCOUNTER — Encounter (HOSPITAL_COMMUNITY): Payer: Self-pay

## 2019-07-19 NOTE — Patient Instructions (Addendum)
DUE TO COVID-19 ONLY ONE VISITOR IS ALLOWED TO COME WITH YOU AND STAY IN THE WAITING ROOM ONLY DURING PRE OP AND PROCEDURE. THE ONE VISITOR MAY VISIT WITH YOU IN YOUR PRIVATE ROOM DURING VISITING HOURS ONLY!!   COVID SWAB TESTING MUST BE COMPLETED ON:  Thursday, Dec. 24, 2020 at  10:00 AM [address] [place] -[facility] enter pre surgical testing line (Must self quarantine after testing. Follow instructions on handout.)             Your procedure is scheduled on: Monday, Dec. 28, 2020    Report to [facility] Main  Entrance   Report to Short Stay at 5:30 AM   Call this number if you have problems the morning of surgery [phone]   Bring CPAP mask and tubing   Dulocolax: Give with water the day prior to surgery.    Miralax: Mix with 64 oz Gatorade/Powerade. Drink gradually over the next few hours (8 oz glass every 15-30 minutes) until gone the day prior to surgery.    Neomycin:  At 2 pm, 3 pm and 10 pm after Miralax bowel prep the day prior to surgery.   Metronidazole:  At 2 pm, 3 pm and 10 pm after Miralax bowel prep the day prior to surgery.   Increase fluid intake to prevent dehydration the day of prep   Do not eat food:After Midnight.   Drink 2 Ensure drinks the night before surgery.     May have liquids until 4:30 AM day of surgery   CLEAR LIQUID DIET  Foods Allowed                                                                     Foods Excluded  Water, Black Coffee and tea, regular and decaf                             liquids that you cannot  Plain Jell-O in any flavor  (No red)                                           see through such as: Fruit ices (not with fruit pulp)                                     milk, soups, orange juice  Iced Popsicles (No red)                                    All solid food Carbonated beverages, regular and diet                                    Apple juices Sports drinks like Gatorade  (No red) Lightly seasoned clear broth or consume(fat free) Sugar, honey syrup  Sample Menu Breakfast  Lunch                                     Supper Cranberry juice                    Beef broth                            Chicken broth Jell-O                                     Grape juice                           Apple juice Coffee or tea                        Jell-O                                      Popsicle                                                Coffee or tea                        Coffee or tea   Complete one Ensure drink the morning of surgery at 4:30 AM   Brush your teeth the morning of surgery.   Do NOT smoke after Midnight   Take these medicines the morning of surgery with A SIP OF WATER: Buspirone, Duloxetine, Loratadine, Metoprolol,  Vascepa, Xanax if needed                               You may not have any metal on your body including hair pins, jewelry, and body piercings             Do not wear make-up, lotions, powders, perfumes/cologne, or deodorant             Do not wear nail polish.  Do not shave  48 hours prior to surgery.    Do not bring valuables to the hospital. [place].   Contacts, dentures or bridgework may not be worn into surgery.   Bring small overnight bag day of surgery.    Special Instructions: Bring a copy of your healthcare power of attorney and living will documents         the day of surgery if you haven't scanned them in before.              Please read over the following fact sheets you were given:  [facility] - Preparing for Surgery Before surgery, you can play an important role.  Because skin is not sterile, your skin needs to be as free of germs as possible.  You can reduce the number of germs on your skin by  washing with CHG (chlorahexidine gluconate) soap before surgery.  CHG is an antiseptic cleaner which kills germs and bonds with the skin to  continue killing germs even after washing. Please DO NOT use if you have an allergy to CHG or antibacterial soaps.  If your skin becomes reddened/irritated stop using the CHG and inform your nurse when you arrive at Short Stay. Do not shave (including legs and underarms) for at least 48 hours prior to the first CHG shower.  You may shave your face/neck.  Please follow these instructions carefully:  1.  Shower with CHG Soap the night before surgery and the  morning of surgery.  2.  If you choose to wash your hair, wash your hair first as usual with your normal  shampoo.  3.  After you shampoo, rinse your hair and body thoroughly to remove the shampoo.                             4.  Use CHG as you would any other liquid soap.  You can apply chg directly to the skin and wash.  Gently with a scrungie or clean washcloth.  5.  Apply the CHG Soap to your body ONLY FROM THE NECK DOWN.   Do   not use on face/ open                           Wound or open sores. Avoid contact with eyes, ears mouth and   genitals (private parts).                       Wash face,  Genitals (private parts) with your normal soap.             6.  Wash thoroughly, paying special attention to the area where your    surgery  will be performed.  7.  Thoroughly rinse your body with warm water from the neck down.  8.  DO NOT shower/wash with your normal soap after using and rinsing off the CHG Soap.                9.  Pat yourself dry with a clean towel.            10.  Wear clean pajamas.            11.  Place clean sheets on your bed the night of your first shower and do not  sleep with pets. Day of Surgery : Do not apply any lotions/deodorants the morning of surgery.  Please wear clean clothes to the hospital/surgery center.  FAILURE TO FOLLOW THESE INSTRUCTIONS MAY RESULT IN THE CANCELLATION OF YOUR SURGERY  PATIENT SIGNATURE_________________________________  NURSE  SIGNATURE__________________________________  ________________________________________________________________________   Heather Lam  An incentive spirometer is a tool that can help keep your lungs clear and active. This tool measures how well you are filling your lungs with each breath. Taking long deep breaths may help reverse or decrease the chance of developing breathing (pulmonary) problems (especially infection) following:  A long period of time when you are unable to move or be active. BEFORE THE PROCEDURE   If the spirometer includes an indicator to show your best effort, your nurse or respiratory therapist will set it to a desired goal.  If possible, sit up straight or lean slightly forward. Try not to slouch.  Hold the incentive spirometer  in an upright position. INSTRUCTIONS FOR USE  1. Sit on the edge of your bed if possible, or sit up as far as you can in bed or on a chair. 2. Hold the incentive spirometer in an upright position. 3. Breathe out normally. 4. Place the mouthpiece in your mouth and seal your lips tightly around it. 5. Breathe in slowly and as deeply as possible, raising the piston or the ball toward the top of the column. 6. Hold your breath for 3-5 seconds or for as long as possible. Allow the piston or ball to fall to the bottom of the column. 7. Remove the mouthpiece from your mouth and breathe out normally. 8. Rest for a few seconds and repeat Steps 1 through 7 at least 10 times every 1-2 hours when you are awake. Take your time and take a few normal breaths between deep breaths. 9. The spirometer may include an indicator to show your best effort. Use the indicator as a goal to work toward during each repetition. 10. After each set of 10 deep breaths, practice coughing to be sure your lungs are clear. If you have an incision (the cut made at the time of surgery), support your incision when coughing by placing a pillow or rolled up towels firmly  against it. Once you are able to get out of bed, walk around indoors and cough well. You may stop using the incentive spirometer when instructed by your caregiver.  RISKS AND COMPLICATIONS  Take your time so you do not get dizzy or light-headed.  If you are in pain, you may need to take or ask for pain medication before doing incentive spirometry. It is harder to take a deep breath if you are having pain. AFTER USE  Rest and breathe slowly and easily.  It can be helpful to keep track of a log of your progress. Your caregiver can provide you with a simple table to help with this. If you are using the spirometer at home, follow these instructions: [place] IF:   You are having difficultly using the spirometer.  You have trouble using the spirometer as often as instructed.  Your pain medication is not giving enough relief while using the spirometer.  You develop fever of 100.5 F (38.1 C) or higher. SEEK IMMEDIATE MEDICAL CARE IF:   You cough up bloody sputum that had not been present before.  You develop fever of 102 F (38.9 C) or greater.  You develop worsening pain at or near the incision site. MAKE SURE YOU:   Understand these instructions.  Will watch your condition.  Will get help right away if you are not doing well or get worse. Document Released: 11/29/2006 Document Revised: 10/11/2011 Document Reviewed: 01/30/2007 [facility] Patient Information 2014 [place], [place].   ________________________________________________________________________

## 2019-07-20 ENCOUNTER — Encounter (HOSPITAL_COMMUNITY)
Admission: RE | Admit: 2019-07-20 | Discharge: 2019-07-20 | Disposition: A | Discharge status: Home / Self Care | Payer: No Typology Code available for payment source | Source: Ambulatory Visit | Attending: Surgery | Admitting: Surgery

## 2019-07-20 ENCOUNTER — Other Ambulatory Visit: Payer: Self-pay

## 2019-07-20 ENCOUNTER — Encounter (HOSPITAL_COMMUNITY): Payer: Self-pay

## 2019-07-20 DIAGNOSIS — G4730 Sleep apnea, unspecified: Secondary | ICD-10-CM | POA: Diagnosis not present

## 2019-07-20 DIAGNOSIS — Z01818 Encounter for other preprocedural examination: Secondary | ICD-10-CM | POA: Insufficient documentation

## 2019-07-20 DIAGNOSIS — I10 Essential (primary) hypertension: Secondary | ICD-10-CM | POA: Insufficient documentation

## 2019-07-20 DIAGNOSIS — E785 Hyperlipidemia, unspecified: Secondary | ICD-10-CM | POA: Diagnosis not present

## 2019-07-20 DIAGNOSIS — F419 Anxiety disorder, unspecified: Secondary | ICD-10-CM | POA: Insufficient documentation

## 2019-07-20 DIAGNOSIS — E559 Vitamin D deficiency, unspecified: Secondary | ICD-10-CM | POA: Diagnosis not present

## 2019-07-20 DIAGNOSIS — Z9851 Tubal ligation status: Secondary | ICD-10-CM | POA: Diagnosis not present

## 2019-07-20 DIAGNOSIS — Z7982 Long term (current) use of aspirin: Secondary | ICD-10-CM | POA: Diagnosis not present

## 2019-07-20 DIAGNOSIS — Z87891 Personal history of nicotine dependence: Secondary | ICD-10-CM | POA: Diagnosis not present

## 2019-07-20 DIAGNOSIS — Z933 Colostomy status: Secondary | ICD-10-CM | POA: Diagnosis not present

## 2019-07-20 DIAGNOSIS — I252 Old myocardial infarction: Secondary | ICD-10-CM | POA: Insufficient documentation

## 2019-07-20 DIAGNOSIS — I2510 Atherosclerotic heart disease of native coronary artery without angina pectoris: Secondary | ICD-10-CM | POA: Insufficient documentation

## 2019-07-20 DIAGNOSIS — Z79899 Other long term (current) drug therapy: Secondary | ICD-10-CM | POA: Insufficient documentation

## 2019-07-20 DIAGNOSIS — Z85828 Personal history of other malignant neoplasm of skin: Secondary | ICD-10-CM | POA: Diagnosis not present

## 2019-07-20 HISTORY — DX: Personal history of other diseases of the nervous system and sense organs: Z86.69

## 2019-07-20 HISTORY — DX: Migraine, unspecified, not intractable, without status migrainosus: G43.909

## 2019-07-20 HISTORY — DX: Hyperlipidemia, unspecified: E78.5

## 2019-07-20 NOTE — Progress Notes (Signed)
PCP - A. Jones P.A. Cardiologist -  Dr. Linard Millers  Chest x-ray - N/A EKG - 07/23/2019 in epic Stress Test - 06/23/2018 in epic ECHO - 01/30/2019 in epic Cardiac Cath - greater than 2 years  Sleep Study - Yes CPAP - Yes  Fasting Blood Sugar - N/A Checks Blood Sugar _N/A____ times a day  Blood Thinner Instructions:  N/A Aspirin Instructions: Yes Last Dose: 07/16/2019  Anesthesia review: OSA, CAD, history of MI  Patient denies shortness of breath, fever, cough and chest pain at PAT appointment   Patient verbalized understanding of instructions that were given to them at the PAT appointment. Patient was also instructed that they will need to review over the PAT instructions again at home before surgery.

## 2019-07-22 MED FILL — LOSARTAN-HCTZ 100-25 MG TAB: 100-25 | 30 days supply | Qty: 30 | Fill #2

## 2019-07-22 MED FILL — risperiDONE 0.5 MG TABS: 0.5 | 90 days supply | Qty: 90 | Fill #1

## 2019-07-23 ENCOUNTER — Other Ambulatory Visit: Payer: Self-pay

## 2019-07-23 ENCOUNTER — Encounter (HOSPITAL_COMMUNITY)
Admission: RE | Admit: 2019-07-23 | Discharge: 2019-07-23 | Disposition: A | Discharge status: Home / Self Care | Payer: No Typology Code available for payment source | Source: Ambulatory Visit | Attending: Surgery | Admitting: Surgery

## 2019-07-23 DIAGNOSIS — Z01818 Encounter for other preprocedural examination: Secondary | ICD-10-CM | POA: Diagnosis not present

## 2019-07-23 LAB — COMPREHENSIVE METABOLIC PANEL
ALT: 15 U/L (ref 0–44)
AST: 19 U/L (ref 15–41)
Albumin: 3.9 g/dL (ref 3.5–5.0)
Alkaline Phosphatase: 61 U/L (ref 38–126)
Anion gap: 10 (ref 5–15)
BUN: 13 mg/dL (ref 6–20)
CO2: 28 mmol/L (ref 22–32)
Calcium: 9.6 mg/dL (ref 8.9–10.3)
Chloride: 100 mmol/L (ref 98–111)
Creatinine, Ser: 0.67 mg/dL (ref 0.44–1.00)
GFR calc Af Amer: >60 mL/min (ref >60)
GFR calc non Af Amer: >60 mL/min (ref >60)
Glucose, Bld: 106 mg/dL — ABNORMAL HIGH (ref 70–99)
Potassium: 3.9 mmol/L (ref 3.5–5.1)
Sodium: 138 mmol/L (ref 135–145)
Total Bilirubin: 0.2 mg/dL — ABNORMAL LOW (ref 0.3–1.2)
Total Protein: 7.5 g/dL (ref 6.5–8.1)

## 2019-07-23 LAB — CBC WITH DIFFERENTIAL/PLATELET
Abs Immature Granulocytes: 0.04 10*3/uL (ref 0.00–0.07)
Basophils Absolute: 0.1 10*3/uL (ref 0.0–0.1)
Basophils Relative: 1 %
Eosinophils Absolute: 0.1 10*3/uL (ref 0.0–0.5)
Eosinophils Relative: 1 %
HCT: 48.3 % — ABNORMAL HIGH (ref 36.0–46.0)
Hemoglobin: 15.2 g/dL — ABNORMAL HIGH (ref 12.0–15.0)
Immature Granulocytes: 0 %
Lymphocytes Relative: 24 %
Lymphs Abs: 2.5 10*3/uL (ref 0.7–4.0)
MCH: 26.8 pg (ref 26.0–34.0)
MCHC: 31.5 g/dL (ref 30.0–36.0)
MCV: 85 fL (ref 80.0–100.0)
Monocytes Absolute: 0.6 10*3/uL (ref 0.1–1.0)
Monocytes Relative: 6 %
Neutro Abs: 7.1 10*3/uL (ref 1.7–7.7)
Neutrophils Relative %: 68 %
Platelets: 232 10*3/uL (ref 150–400)
RBC: 5.68 MIL/uL — ABNORMAL HIGH (ref 3.87–5.11)
RDW: 14.8 % (ref 11.5–15.5)
WBC: 10.5 10*3/uL (ref 4.0–10.5)
nRBC: 0 % (ref 0.0–0.2)

## 2019-07-23 LAB — HEMOGLOBIN A1C
Hgb A1c MFr Bld: 5.8 % — ABNORMAL HIGH (ref 4.8–5.6)
Mean Plasma Glucose: 119.76 mg/dL

## 2019-07-23 NOTE — Progress Notes (Signed)
Anesthesia Chart Review   Case: [id number] Date/Time: 07/30/19 0715   Procedure: LAPAROSCOPIC VS OPEN REVERSAL OF END COLOSTOMY (N/A )   Anesthesia type: General   Pre-op diagnosis: colostomy   [facility] ROOM 02 / [facility]   Surgeons: Clovis Riley, MD      DISCUSSION:58 y.o. former smoker (20 pack years, quit 08/02/08) with h/o PONV, CAD ([place], stent to RCA 2010), HTN, sleep apnea w/cpap, colostomy scheduled for above procedure 07/30/2019 with Dr. Romana Juniper.   Last seen by cardiologist, Dr. Daneen Schick, 06/05/2018.  Stress test ordered at this visit.  Stress test 06/23/2018 low risk study.  No recurrent sx.   S/p Hartmann procedure 02/09/2019 with no anesthesia complications noted.   VS: LMP 12/31/2013   PROVIDERS: Terald Sleeper, PA-C is PCP   Daneen Schick, MD is Cardiologist  LABS: Labs reviewed: Acceptable for surgery. (all labs ordered are listed, but only abnormal results are displayed)  Labs Reviewed - No data to display   IMAGES:   EKG: 07/23/2019 Rate 65 bpm Normal sinus rhythm   CV: Echo 01/30/2019 IMPRESSIONS    1. The left ventricle has normal systolic function, with an ejection fraction of 55-60%. The cavity size was normal. There is mildly increased left ventricular wall thickness. Left ventricular diastolic Doppler parameters are consistent with impaired  relaxation. Indeterminate filling pressures The E/e' is 8-15. No evidence of left ventricular regional wall motion abnormalities.  2. The right ventricle has normal systolic function. The cavity was normal. There is no increase in right ventricular wall thickness.  3. Left atrial size was mild-moderately dilated.  4. Right atrial size was mildly dilated.  5. The mitral valve is abnormal. Mild thickening of the mitral valve leaflet.  6. The tricuspid valve is grossly normal.  7. The aortic valve is tricuspid. No stenosis of the aortic valve.  Myocardial Perfusion 06/23/2018  Nuclear stress EF:  81%.  There were T wave inversions in the inferolateral wall during infusion  The study is normal.  This is a low risk study.  The left ventricular ejection fraction is hyperdynamic (>65%). Past Medical History:  Diagnosis Date  . Anxiety   . Back pain   . CAD (coronary artery disease)   . Chest pain   . Complication of anesthesia   . Constipation   . Diverticulitis   . Diverticulosis   . Heart attack ([place]) 2010  . History of carpal tunnel syndrome    Bilateral  . History of kidney stones   . Hyperlipidemia   . Hypertension   . Joint pain   . Migraines   . PONV (postoperative nausea and vomiting)   . Skin cancer    on nose  . Sleep apnea    wears CPAP nightly  . Vitamin D deficiency     Past Surgical History:  Procedure Laterality Date  . APPENDECTOMY    . APPLICATION OF WOUND VAC N/A 02/09/2019   Procedure: APPLICATION OF WOUND VAC;  Surgeon: Clovis Riley, MD;  Location: [place];  Service: General;  Laterality: N/A;  . BACK SURGERY    . CARPAL TUNNEL RELEASE Right   . CARPAL TUNNEL RELEASE Left 08/13/2016   Procedure: LEFT CARPAL TUNNEL RELEASE;  Surgeon: Roseanne Kaufman, MD;  Location: [place];  Service: Orthopedics;  Laterality: Left;  . COLECTOMY WITH COLOSTOMY CREATION/HARTMANN PROCEDURE N/A 02/09/2019   Procedure: HARTMANN PROCEDURE;  Surgeon: Clovis Riley, MD;  Location: [place];  Service: General;  Laterality: N/A;  . COLONOSCOPY    . COLOSTOMY N/A 02/09/2019   Procedure: COLOSTOMY;  Surgeon: Clovis Riley, MD;  Location: [place];  Service: General;  Laterality: N/A;  . CORONARY STENT PLACEMENT     2 stents in the same artery  . ENDOMETRIAL ABLATION  2005  . EXTRACORPOREAL SHOCK WAVE LITHOTRIPSY Right 06/27/2017   Procedure: RIGHT EXTRACORPOREAL SHOCK WAVE LITHOTRIPSY (ESWL);  Surgeon: Franchot Gallo, MD;  [facility];  Service: Urology;  Laterality: Right;  . KNEE ARTHROSCOPY WITH MENISCAL REPAIR Right   . MOHS SURGERY     . TUBAL LIGATION  1991    MEDICATIONS: . acetaminophen (TYLENOL) 500 MG tablet  . ALPRAZolam (XANAX) 0.25 MG tablet  . Ascorbic Acid (VITAMIN C) 100 MG tablet  . aspirin EC 325 MG tablet  . Biotin 1 MG CAPS  . busPIRone (BUSPAR) 15 MG tablet  . Coenzyme Q10 (CO Q10) 100 MG CAPS  . cyanocobalamin (,VITAMIN B-12,) 1000 MCG/ML injection  . cyclobenzaprine (FLEXERIL) 10 MG tablet  . DULoxetine (CYMBALTA) 30 MG capsule  . loratadine (CLARITIN) 10 MG tablet  . losartan-hydrochlorothiazide (HYZAAR) 100-25 MG tablet  . metoprolol tartrate (LOPRESSOR) 25 MG tablet  . nitroGLYCERIN (NITROSTAT) 0.4 MG SL tablet  . risperiDONE (RISPERDAL) 0.5 MG tablet  . rosuvastatin (CRESTOR) 10 MG tablet  . traZODone (DESYREL) 50 MG tablet  . VASCEPA 1 g CAPS  . Vitamin D, Ergocalciferol, (DRISDOL) 1.25 MG (50000 UT) CAPS capsule  . Zinc 50 MG CAPS   No current facility-administered medications for this encounter.     Maia Plan [facility] Pre-Surgical Testing [phone] 07/23/19  2:02 PM

## 2019-07-25 MED FILL — NEOMYCIN 500 MG TABLET: 500 | 1 days supply | Qty: 6 | Fill #0

## 2019-07-25 MED FILL — BISACODYL 10 MG SUPP: 10 | 1 days supply | Qty: 1 | Fill #0

## 2019-07-25 MED FILL — METRONIDAZOLE 500 MG TABS: 500 | 1 days supply | Qty: 6 | Fill #0

## 2019-07-26 ENCOUNTER — Other Ambulatory Visit (HOSPITAL_COMMUNITY)
Admission: RE | Admit: 2019-07-26 | Discharge: 2019-07-26 | Disposition: A | Discharge status: Home / Self Care | Payer: No Typology Code available for payment source | Source: Ambulatory Visit | Attending: Surgery | Admitting: Surgery

## 2019-07-26 ENCOUNTER — Other Ambulatory Visit: Payer: Self-pay | Admitting: *Deleted

## 2019-07-26 DIAGNOSIS — Z20828 Contact with and (suspected) exposure to other viral communicable diseases: Secondary | ICD-10-CM | POA: Insufficient documentation

## 2019-07-26 DIAGNOSIS — Z01812 Encounter for preprocedural laboratory examination: Secondary | ICD-10-CM | POA: Diagnosis not present

## 2019-07-26 NOTE — Patient Outreach (Signed)
[facility]) Care Management  07/26/2019  Heather Lam May 15, 1961 [id number]    Preoperative Screening Call Referral received: 07/26/19 Surgery/procedure date: 07/30/19 Insurance: [place]   Subjective:  Initial successful telephone call to patient's preferred number in order to complete preoperative screening. 2 HIPAA identifiers verified. Discussed purpose of preoperative call. Patient voices understanding and agrees to call.  She discussed prior surgery and having a colostomy and being thankful to have surgery planned for reversal of colostomy. She understands  the expected time of arrival. She  completed her preoperative testing on this morning. and has no additional questions. She  expects to be in the hospital 4-5  days.  She  states she has submitted  medical leave paperwork and she  does have  the hospital indemnity benefit and is aware she will have to file the claim after her surgery. She says she  will have 24/7 care she will stay at her moms home after surgery with eventual plan to return to her home and her daughter for support.   She does not have and is not interested in completing advanced directives at this time. She agrees to a post hospital discharge transition of care call.    Objective:   Heather Lam is scheduled for Laparoscopic vs open reversal of end colostomy  at [facility]. She completed pre-op testing on 12/24 including covid testing .   Assessment: Preoperative call completed, no preoperative needs identified.   Plan: RNCM will call patient for transition of care outreach within 72 hours of hospital discharge notification.  Joylene Draft, RN, [place] Management Coordinator  [phone]- Mobile [phone]- Toll Free Main Office

## 2019-07-27 LAB — NOVEL CORONAVIRUS, NAA (HOSP ORDER, SEND-OUT TO REF LAB; TAT 18-24 HRS): SARS-CoV-2, NAA: NOT DETECTED

## 2019-07-29 MED ORDER — BUPIVACAINE LIPOSOME 1.3 % IJ SUSP
20.0000 mL | INTRAMUSCULAR | Status: DC
  Filled 2019-07-29: qty 20

## 2019-07-29 NOTE — Anesthesia Preprocedure Evaluation (Signed)
Anesthesia Evaluation  Patient identified by MRN, date of birth, ID band Patient awake and Patient unresponsive    Reviewed: Allergy & Precautions, NPO status , Patient's Chart, lab work & pertinent test results  History of Anesthesia Complications (+) PONV and history of anesthetic complications  Airway Mallampati: II  TM Distance: >3 FB Neck ROM: Full    Dental  (+) Dental Advisory Given   Pulmonary sleep apnea , former smoker,    Pulmonary exam normal breath sounds clear to auscultation       Cardiovascular hypertension, Pt. on medications and Pt. on home beta blockers + CAD and + Past MI  Normal cardiovascular exam Rhythm:Regular Rate:Normal     Neuro/Psych  Headaches, PSYCHIATRIC DISORDERS Anxiety Depression  Neuromuscular disease    GI/Hepatic negative GI ROS, Neg liver ROS,   Endo/Other  negative endocrine ROS  Renal/GU Renal disease     Musculoskeletal negative musculoskeletal ROS (+)   Abdominal   Peds  Hematology negative hematology ROS (+) anemia ,   Anesthesia Other Findings   Reproductive/Obstetrics negative OB ROS                             Anesthesia Physical  Anesthesia Plan  ASA: III  Anesthesia Plan: General   Post-op Pain Management:    Induction: Intravenous  PONV Risk Score and Plan: 4 or greater and Ondansetron, Dexamethasone, Treatment may vary due to age or medical condition and Midazolam  Airway Management Planned: Oral ETT  Additional Equipment: None  Intra-op Plan:   Post-operative Plan: Extubation in OR  Informed Consent: I have reviewed the patients History and Physical, chart, labs and discussed the procedure including the risks, benefits and alternatives for the proposed anesthesia with the patient or authorized representative who has indicated his/her understanding and acceptance.     Dental advisory given  Plan Discussed with:  CRNA  Anesthesia Plan Comments:         Anesthesia Quick Evaluation

## 2019-07-30 ENCOUNTER — Encounter (HOSPITAL_COMMUNITY): Payer: Self-pay | Admitting: Surgery

## 2019-07-30 ENCOUNTER — Inpatient Hospital Stay (HOSPITAL_COMMUNITY): Payer: No Typology Code available for payment source | Admitting: Anesthesiology

## 2019-07-30 ENCOUNTER — Inpatient Hospital Stay (HOSPITAL_COMMUNITY): Payer: No Typology Code available for payment source | Admitting: Physician Assistant

## 2019-07-30 ENCOUNTER — Encounter (HOSPITAL_COMMUNITY)
Admission: RE | Disposition: A | Discharge status: Home / Self Care | Payer: Self-pay | Source: Home / Self Care | Attending: Surgery

## 2019-07-30 ENCOUNTER — Other Ambulatory Visit: Payer: Self-pay

## 2019-07-30 ENCOUNTER — Inpatient Hospital Stay (HOSPITAL_COMMUNITY)
Admission: RE | Admit: 2019-07-30 | Discharge: 2019-08-02 | DRG: 330 | Disposition: A | Discharge status: Home / Self Care | Payer: No Typology Code available for payment source | Attending: Surgery | Admitting: Surgery

## 2019-07-30 DIAGNOSIS — D62 Acute posthemorrhagic anemia: Secondary | ICD-10-CM | POA: Diagnosis not present

## 2019-07-30 DIAGNOSIS — Z809 Family history of malignant neoplasm, unspecified: Secondary | ICD-10-CM | POA: Diagnosis not present

## 2019-07-30 DIAGNOSIS — I10 Essential (primary) hypertension: Secondary | ICD-10-CM | POA: Diagnosis present

## 2019-07-30 DIAGNOSIS — Z8349 Family history of other endocrine, nutritional and metabolic diseases: Secondary | ICD-10-CM

## 2019-07-30 DIAGNOSIS — Z8249 Family history of ischemic heart disease and other diseases of the circulatory system: Secondary | ICD-10-CM

## 2019-07-30 DIAGNOSIS — Z7982 Long term (current) use of aspirin: Secondary | ICD-10-CM | POA: Diagnosis not present

## 2019-07-30 DIAGNOSIS — Z6834 Body mass index (BMI) 34.0-34.9, adult: Secondary | ICD-10-CM | POA: Diagnosis not present

## 2019-07-30 DIAGNOSIS — F411 Generalized anxiety disorder: Secondary | ICD-10-CM | POA: Diagnosis present

## 2019-07-30 DIAGNOSIS — E785 Hyperlipidemia, unspecified: Secondary | ICD-10-CM | POA: Diagnosis present

## 2019-07-30 DIAGNOSIS — Z833 Family history of diabetes mellitus: Secondary | ICD-10-CM | POA: Diagnosis not present

## 2019-07-30 DIAGNOSIS — Z20828 Contact with and (suspected) exposure to other viral communicable diseases: Secondary | ICD-10-CM | POA: Diagnosis present

## 2019-07-30 DIAGNOSIS — Z87442 Personal history of urinary calculi: Secondary | ICD-10-CM

## 2019-07-30 DIAGNOSIS — K660 Peritoneal adhesions (postprocedural) (postinfection): Secondary | ICD-10-CM | POA: Diagnosis present

## 2019-07-30 DIAGNOSIS — Z433 Encounter for attention to colostomy: Principal | ICD-10-CM

## 2019-07-30 DIAGNOSIS — Z87891 Personal history of nicotine dependence: Secondary | ICD-10-CM | POA: Diagnosis not present

## 2019-07-30 DIAGNOSIS — Z79899 Other long term (current) drug therapy: Secondary | ICD-10-CM

## 2019-07-30 DIAGNOSIS — E669 Obesity, unspecified: Secondary | ICD-10-CM | POA: Diagnosis present

## 2019-07-30 DIAGNOSIS — G4733 Obstructive sleep apnea (adult) (pediatric): Secondary | ICD-10-CM | POA: Diagnosis present

## 2019-07-30 DIAGNOSIS — Z8261 Family history of arthritis: Secondary | ICD-10-CM

## 2019-07-30 DIAGNOSIS — I252 Old myocardial infarction: Secondary | ICD-10-CM

## 2019-07-30 DIAGNOSIS — Z955 Presence of coronary angioplasty implant and graft: Secondary | ICD-10-CM

## 2019-07-30 DIAGNOSIS — I2510 Atherosclerotic heart disease of native coronary artery without angina pectoris: Secondary | ICD-10-CM | POA: Diagnosis present

## 2019-07-30 DIAGNOSIS — Z9889 Other specified postprocedural states: Secondary | ICD-10-CM | POA: Diagnosis present

## 2019-07-30 DIAGNOSIS — Z85828 Personal history of other malignant neoplasm of skin: Secondary | ICD-10-CM | POA: Diagnosis not present

## 2019-07-30 DIAGNOSIS — F419 Anxiety disorder, unspecified: Secondary | ICD-10-CM | POA: Diagnosis present

## 2019-07-30 HISTORY — PX: COLOSTOMY REVERSAL: SHX5782

## 2019-07-30 SURGERY — COLOSTOMY REVERSAL
Anesthesia: General

## 2019-07-30 MED ORDER — FENTANYL CITRATE (PF) 250 MCG/5ML IJ SOLN
INTRAMUSCULAR | Status: AC
  Filled 2019-07-30: qty 5

## 2019-07-30 MED ORDER — DEXAMETHASONE SODIUM PHOSPHATE 10 MG/ML IJ SOLN
INTRAMUSCULAR | Status: DC | PRN
  Administered 2019-07-30: 10 mg via INTRAVENOUS

## 2019-07-30 MED ORDER — CHLORHEXIDINE GLUCONATE CLOTH 2 % EX PADS
6.0000 | MEDICATED_PAD | Freq: Every day | CUTANEOUS | Status: DC
  Administered 2019-07-30 – 2019-07-31 (×2): 6 via TOPICAL

## 2019-07-30 MED ORDER — BUPIVACAINE HCL 0.25 % IJ SOLN
INTRAMUSCULAR | Status: DC | PRN
  Administered 2019-07-30: 30 mL

## 2019-07-30 MED ORDER — ALUM & MAG HYDROXIDE-SIMETH 200-200-20 MG/5ML PO SUSP: 30.0000 mL | Freq: Four times a day (QID) | ORAL | Status: DC | PRN

## 2019-07-30 MED ORDER — DIPHENHYDRAMINE HCL 25 MG PO CAPS: 25.0000 mg | ORAL_CAPSULE | Freq: Four times a day (QID) | ORAL | Status: DC | PRN

## 2019-07-30 MED ORDER — PROPOFOL 500 MG/50ML IV EMUL
INTRAVENOUS | Status: AC
  Filled 2019-07-30: qty 50

## 2019-07-30 MED ORDER — LIDOCAINE HCL 2 % IJ SOLN
INTRAMUSCULAR | Status: AC
  Filled 2019-07-30: qty 40

## 2019-07-30 MED ORDER — MEPERIDINE HCL 50 MG/ML IJ SOLN: 6.2500 mg | INTRAMUSCULAR | Status: DC | PRN

## 2019-07-30 MED ORDER — DIPHENHYDRAMINE HCL 50 MG/ML IJ SOLN: 25.0000 mg | Freq: Four times a day (QID) | INTRAMUSCULAR | Status: DC | PRN

## 2019-07-30 MED ORDER — CHLORHEXIDINE GLUCONATE 4 % EX LIQD: 60.0000 mL | Freq: Once | CUTANEOUS | Status: DC

## 2019-07-30 MED ORDER — BUPIVACAINE HCL 0.25 % IJ SOLN
INTRAMUSCULAR | Status: AC
  Filled 2019-07-30: qty 1

## 2019-07-30 MED ORDER — POLYETHYLENE GLYCOL 3350 17 GM/SCOOP PO POWD: 1.0000 | Freq: Once | ORAL | Status: DC

## 2019-07-30 MED ORDER — TRAZODONE HCL 50 MG PO TABS
50.0000 mg | ORAL_TABLET | Freq: Every evening | ORAL | Status: DC | PRN
  Administered 2019-08-01: 50 mg via ORAL
  Filled 2019-07-30: qty 1

## 2019-07-30 MED ORDER — ONDANSETRON HCL 4 MG/2ML IJ SOLN
INTRAMUSCULAR | Status: DC | PRN
  Administered 2019-07-30: 4 mg via INTRAVENOUS

## 2019-07-30 MED ORDER — PROMETHAZINE HCL 25 MG/ML IJ SOLN: 6.2500 mg | INTRAMUSCULAR | Status: DC | PRN

## 2019-07-30 MED ORDER — SACCHAROMYCES BOULARDII 250 MG PO CAPS
250.0000 mg | ORAL_CAPSULE | Freq: Two times a day (BID) | ORAL | Status: DC
  Administered 2019-07-30 – 2019-08-02 (×6): 250 mg via ORAL
  Filled 2019-07-30 (×6): qty 1

## 2019-07-30 MED ORDER — PROPOFOL 500 MG/50ML IV EMUL
INTRAVENOUS | Status: DC | PRN
  Administered 2019-07-30: 25 ug/kg/min via INTRAVENOUS

## 2019-07-30 MED ORDER — GABAPENTIN 300 MG PO CAPS
300.0000 mg | ORAL_CAPSULE | Freq: Two times a day (BID) | ORAL | Status: DC
  Administered 2019-07-30 – 2019-08-02 (×6): 300 mg via ORAL
  Filled 2019-07-30 (×6): qty 1

## 2019-07-30 MED ORDER — DULOXETINE HCL 60 MG PO CPEP
60.0000 mg | ORAL_CAPSULE | Freq: Every day | ORAL | Status: DC
  Administered 2019-07-31 – 2019-08-02 (×3): 60 mg via ORAL
  Filled 2019-07-30 (×3): qty 1

## 2019-07-30 MED ORDER — SODIUM CHLORIDE 0.9 % IV SOLN
2.0000 g | Freq: Two times a day (BID) | INTRAVENOUS | Status: AC
  Administered 2019-07-30: 2 g via INTRAVENOUS
  Filled 2019-07-30: qty 2

## 2019-07-30 MED ORDER — HYDROMORPHONE HCL 1 MG/ML IJ SOLN: 0.2500 mg | INTRAMUSCULAR | Status: DC | PRN

## 2019-07-30 MED ORDER — HYDROMORPHONE HCL 1 MG/ML IJ SOLN
0.5000 mg | INTRAMUSCULAR | Status: DC | PRN
  Administered 2019-07-30: 0.5 mg via INTRAVENOUS
  Filled 2019-07-30: qty 0.5

## 2019-07-30 MED ORDER — BUPIVACAINE LIPOSOME 1.3 % IJ SUSP
INTRAMUSCULAR | Status: DC | PRN
  Administered 2019-07-30: 20 mL

## 2019-07-30 MED ORDER — MIDAZOLAM HCL 5 MG/5ML IJ SOLN
INTRAMUSCULAR | Status: DC | PRN
  Administered 2019-07-30: 2 mg via INTRAVENOUS

## 2019-07-30 MED ORDER — ONDANSETRON HCL 4 MG/2ML IJ SOLN
INTRAMUSCULAR | Status: AC
  Filled 2019-07-30: qty 2

## 2019-07-30 MED ORDER — METRONIDAZOLE 500 MG PO TABS: 1000.0000 mg | ORAL_TABLET | ORAL | Status: DC

## 2019-07-30 MED ORDER — SCOPOLAMINE 1 MG/3DAYS TD PT72
MEDICATED_PATCH | TRANSDERMAL | Status: AC
  Filled 2019-07-30: qty 1

## 2019-07-30 MED ORDER — ACETAMINOPHEN 500 MG PO TABS
1000.0000 mg | ORAL_TABLET | Freq: Four times a day (QID) | ORAL | Status: DC
  Administered 2019-07-30 – 2019-08-02 (×10): 1000 mg via ORAL
  Filled 2019-07-30 (×11): qty 2

## 2019-07-30 MED ORDER — ENOXAPARIN SODIUM 40 MG/0.4ML ~~LOC~~ SOLN
40.0000 mg | SUBCUTANEOUS | Status: DC
  Administered 2019-07-31 – 2019-08-02 (×3): 40 mg via SUBCUTANEOUS
  Filled 2019-07-30 (×3): qty 0.4

## 2019-07-30 MED ORDER — ROCURONIUM BROMIDE 10 MG/ML (PF) SYRINGE
PREFILLED_SYRINGE | INTRAVENOUS | Status: DC | PRN
  Administered 2019-07-30: 10 mg via INTRAVENOUS
  Administered 2019-07-30: 50 mg via INTRAVENOUS
  Administered 2019-07-30 (×3): 10 mg via INTRAVENOUS
  Administered 2019-07-30 (×2): 20 mg via INTRAVENOUS

## 2019-07-30 MED ORDER — BISACODYL 5 MG PO TBEC: 20.0000 mg | DELAYED_RELEASE_TABLET | Freq: Once | ORAL | Status: DC

## 2019-07-30 MED ORDER — ROCURONIUM BROMIDE 10 MG/ML (PF) SYRINGE
PREFILLED_SYRINGE | INTRAVENOUS | Status: AC
  Filled 2019-07-30: qty 20

## 2019-07-30 MED ORDER — PHENYLEPHRINE 40 MCG/ML (10ML) SYRINGE FOR IV PUSH (FOR BLOOD PRESSURE SUPPORT)
PREFILLED_SYRINGE | INTRAVENOUS | Status: DC | PRN
  Administered 2019-07-30 (×13): 80 ug via INTRAVENOUS

## 2019-07-30 MED ORDER — LIDOCAINE HCL (CARDIAC) PF 100 MG/5ML IV SOSY
PREFILLED_SYRINGE | INTRAVENOUS | Status: DC | PRN
  Administered 2019-07-30: 60 mg via INTRATRACHEAL

## 2019-07-30 MED ORDER — KETAMINE HCL 10 MG/ML IJ SOLN
INTRAMUSCULAR | Status: AC
  Filled 2019-07-30: qty 1

## 2019-07-30 MED ORDER — LACTATED RINGERS IV SOLN: INTRAVENOUS | Status: DC

## 2019-07-30 MED ORDER — ACETAMINOPHEN 500 MG PO TABS
1000.0000 mg | ORAL_TABLET | ORAL | Status: AC
  Administered 2019-07-30: 1000 mg via ORAL
  Filled 2019-07-30: qty 2

## 2019-07-30 MED ORDER — FENTANYL CITRATE (PF) 100 MCG/2ML IJ SOLN
INTRAMUSCULAR | Status: DC | PRN
  Administered 2019-07-30: 100 ug via INTRAVENOUS
  Administered 2019-07-30 (×3): 50 ug via INTRAVENOUS
  Administered 2019-07-30: 100 ug via INTRAVENOUS
  Administered 2019-07-30 (×4): 50 ug via INTRAVENOUS

## 2019-07-30 MED ORDER — PROPOFOL 10 MG/ML IV BOLUS
INTRAVENOUS | Status: DC | PRN
  Administered 2019-07-30: 180 mg via INTRAVENOUS

## 2019-07-30 MED ORDER — SODIUM CHLORIDE 0.9 % IV SOLN
2.0000 g | INTRAVENOUS | Status: AC
  Administered 2019-07-30: 2 g via INTRAVENOUS
  Filled 2019-07-30: qty 2

## 2019-07-30 MED ORDER — LACTATED RINGERS IR SOLN
Status: DC | PRN
  Administered 2019-07-30: 1000 mL

## 2019-07-30 MED ORDER — SCOPOLAMINE 1 MG/3DAYS TD PT72
MEDICATED_PATCH | TRANSDERMAL | Status: DC | PRN
  Administered 2019-07-30: 1 via TRANSDERMAL

## 2019-07-30 MED ORDER — SUGAMMADEX SODIUM 200 MG/2ML IV SOLN
INTRAVENOUS | Status: DC | PRN
  Administered 2019-07-30: 200 mg via INTRAVENOUS

## 2019-07-30 MED ORDER — METOCLOPRAMIDE HCL 5 MG/ML IJ SOLN: 10.0000 mg | Freq: Four times a day (QID) | INTRAMUSCULAR | Status: DC | PRN

## 2019-07-30 MED ORDER — LIDOCAINE 2% (20 MG/ML) 5 ML SYRINGE
INTRAMUSCULAR | Status: DC | PRN
  Administered 2019-07-30: 3.9 mL/h via INTRAVENOUS

## 2019-07-30 MED ORDER — ALVIMOPAN 12 MG PO CAPS
12.0000 mg | ORAL_CAPSULE | Freq: Two times a day (BID) | ORAL | Status: DC
  Administered 2019-07-31 (×2): 12 mg via ORAL
  Filled 2019-07-30 (×2): qty 1

## 2019-07-30 MED ORDER — RISPERIDONE 0.25 MG PO TABS
0.5000 mg | ORAL_TABLET | Freq: Every day | ORAL | Status: DC
  Administered 2019-07-30 – 2019-08-01 (×3): 0.5 mg via ORAL
  Filled 2019-07-30 (×3): qty 2

## 2019-07-30 MED ORDER — SODIUM CHLORIDE (PF) 0.9 % IJ SOLN
INTRAMUSCULAR | Status: AC
  Filled 2019-07-30: qty 50

## 2019-07-30 MED ORDER — SODIUM CHLORIDE 0.9 % IV SOLN: INTRAVENOUS | Status: DC

## 2019-07-30 MED ORDER — HEPARIN SODIUM (PORCINE) 5000 UNIT/ML IJ SOLN
5000.0000 [IU] | Freq: Once | INTRAMUSCULAR | Status: AC
  Administered 2019-07-30: 5000 [IU] via SUBCUTANEOUS
  Filled 2019-07-30: qty 1

## 2019-07-30 MED ORDER — GABAPENTIN 300 MG PO CAPS
300.0000 mg | ORAL_CAPSULE | ORAL | Status: AC
  Administered 2019-07-30: 300 mg via ORAL
  Filled 2019-07-30: qty 1

## 2019-07-30 MED ORDER — ENSURE SURGERY PO LIQD
237.0000 mL | Freq: Two times a day (BID) | ORAL | Status: DC
  Administered 2019-07-31 – 2019-08-01 (×2): 237 mL via ORAL
  Filled 2019-07-30 (×6): qty 237

## 2019-07-30 MED ORDER — KETOROLAC TROMETHAMINE 15 MG/ML IJ SOLN
15.0000 mg | Freq: Four times a day (QID) | INTRAMUSCULAR | Status: DC | PRN
  Administered 2019-07-30 – 2019-08-02 (×5): 15 mg via INTRAVENOUS
  Filled 2019-07-30 (×6): qty 1

## 2019-07-30 MED ORDER — CYCLOBENZAPRINE HCL 10 MG PO TABS: 10.0000 mg | ORAL_TABLET | Freq: Three times a day (TID) | ORAL | Status: DC | PRN

## 2019-07-30 MED ORDER — ONDANSETRON HCL 4 MG PO TABS: 4.0000 mg | ORAL_TABLET | Freq: Four times a day (QID) | ORAL | Status: DC | PRN

## 2019-07-30 MED ORDER — ALVIMOPAN 12 MG PO CAPS
12.0000 mg | ORAL_CAPSULE | ORAL | Status: AC
  Administered 2019-07-30: 12 mg via ORAL
  Filled 2019-07-30: qty 1

## 2019-07-30 MED ORDER — PROPOFOL 10 MG/ML IV BOLUS
INTRAVENOUS | Status: AC
  Filled 2019-07-30: qty 20

## 2019-07-30 MED ORDER — EPHEDRINE SULFATE-NACL 50-0.9 MG/10ML-% IV SOSY
PREFILLED_SYRINGE | INTRAVENOUS | Status: DC | PRN
  Administered 2019-07-30: 15 mg via INTRAVENOUS
  Administered 2019-07-30 (×2): 10 mg via INTRAVENOUS
  Administered 2019-07-30: 15 mg via INTRAVENOUS
  Administered 2019-07-30 (×3): 10 mg via INTRAVENOUS
  Administered 2019-07-30 (×2): 15 mg via INTRAVENOUS
  Administered 2019-07-30 (×2): 10 mg via INTRAVENOUS

## 2019-07-30 MED ORDER — BUSPIRONE HCL 5 MG PO TABS
15.0000 mg | ORAL_TABLET | Freq: Three times a day (TID) | ORAL | Status: DC
  Administered 2019-07-30 – 2019-08-02 (×9): 15 mg via ORAL
  Filled 2019-07-30 (×9): qty 3

## 2019-07-30 MED ORDER — HYDRALAZINE HCL 20 MG/ML IJ SOLN: 10.0000 mg | INTRAMUSCULAR | Status: DC | PRN

## 2019-07-30 MED ORDER — KETAMINE HCL 50 MG/ML IJ SOLN
INTRAMUSCULAR | Status: DC | PRN
  Administered 2019-07-30: 30 mg via INTRAMUSCULAR

## 2019-07-30 MED ORDER — NALOXONE HCL 0.4 MG/ML IJ SOLN
INTRAMUSCULAR | Status: AC
  Filled 2019-07-30: qty 1

## 2019-07-30 MED ORDER — ALPRAZOLAM 0.25 MG PO TABS: 0.2500 mg | ORAL_TABLET | Freq: Every day | ORAL | Status: DC | PRN

## 2019-07-30 MED ORDER — NEOMYCIN SULFATE 500 MG PO TABS: 1000.0000 mg | ORAL_TABLET | ORAL | Status: DC

## 2019-07-30 MED ORDER — SUCCINYLCHOLINE CHLORIDE 20 MG/ML IJ SOLN
INTRAMUSCULAR | Status: DC | PRN
  Administered 2019-07-30: 120 mg via INTRAVENOUS

## 2019-07-30 MED ORDER — ONDANSETRON HCL 4 MG/2ML IJ SOLN: 4.0000 mg | Freq: Four times a day (QID) | INTRAMUSCULAR | Status: DC | PRN

## 2019-07-30 MED ORDER — FENTANYL CITRATE (PF) 100 MCG/2ML IJ SOLN
INTRAMUSCULAR | Status: AC
  Filled 2019-07-30: qty 2

## 2019-07-30 MED ORDER — EPHEDRINE 5 MG/ML INJ
INTRAVENOUS | Status: AC
  Filled 2019-07-30: qty 30

## 2019-07-30 MED ORDER — METOPROLOL TARTRATE 25 MG PO TABS
25.0000 mg | ORAL_TABLET | Freq: Two times a day (BID) | ORAL | Status: DC
  Administered 2019-07-30 – 2019-08-01 (×4): 25 mg via ORAL
  Filled 2019-07-30 (×6): qty 1

## 2019-07-30 MED ORDER — OXYCODONE HCL 5 MG PO TABS
5.0000 mg | ORAL_TABLET | ORAL | Status: DC | PRN
  Administered 2019-07-30 – 2019-08-01 (×4): 5 mg via ORAL
  Administered 2019-08-01: 10 mg via ORAL
  Filled 2019-07-30: qty 1
  Filled 2019-07-30: qty 2
  Filled 2019-07-30 (×2): qty 1
  Filled 2019-07-30: qty 2
  Filled 2019-07-30: qty 1
  Filled 2019-07-30: qty 2

## 2019-07-30 MED ORDER — PHENYLEPHRINE 40 MCG/ML (10ML) SYRINGE FOR IV PUSH (FOR BLOOD PRESSURE SUPPORT)
PREFILLED_SYRINGE | INTRAVENOUS | Status: AC
  Filled 2019-07-30: qty 20

## 2019-07-30 MED ORDER — MIDAZOLAM HCL 2 MG/2ML IJ SOLN
INTRAMUSCULAR | Status: AC
  Filled 2019-07-30: qty 2

## 2019-07-30 SURGICAL SUPPLY — 75 items
ADH SKN CLS APL DERMABOND .7 (GAUZE/BANDAGES/DRESSINGS) ×1
APPLIER CLIP 5 13 M/L LIGAMAX5 (MISCELLANEOUS)
APPLIER CLIP ROT 10 11.4 M/L (STAPLE)
APR CLP MED LRG 11.4X10 (STAPLE)
APR CLP MED LRG 5 ANG JAW (MISCELLANEOUS)
BLADE EXTENDED COATED 6.5IN (ELECTRODE) IMPLANT
CABLE HIGH FREQUENCY MONO STRZ (ELECTRODE) ×2 IMPLANT
CELLS DAT CNTRL 66122 CELL SVR (MISCELLANEOUS) ×1 IMPLANT
CLIP APPLIE 5 13 M/L LIGAMAX5 (MISCELLANEOUS) IMPLANT
CLIP APPLIE ROT 10 11.4 M/L (STAPLE) IMPLANT
COVER SURGICAL LIGHT HANDLE (MISCELLANEOUS) ×4 IMPLANT
COVER WAND RF STERILE (DRAPES) IMPLANT
DECANTER SPIKE VIAL GLASS SM (MISCELLANEOUS) ×2 IMPLANT
DERMABOND ADVANCED (GAUZE/BANDAGES/DRESSINGS) ×1
DERMABOND ADVANCED .7 DNX12 (GAUZE/BANDAGES/DRESSINGS) IMPLANT
DRAIN CHANNEL 19F RND (DRAIN) IMPLANT
DRSG OPSITE POSTOP 4X10 (GAUZE/BANDAGES/DRESSINGS) IMPLANT
DRSG OPSITE POSTOP 4X6 (GAUZE/BANDAGES/DRESSINGS) IMPLANT
DRSG OPSITE POSTOP 4X8 (GAUZE/BANDAGES/DRESSINGS) IMPLANT
DRSG TEGADERM 8X12 (GAUZE/BANDAGES/DRESSINGS) ×1 IMPLANT
ELECT REM PT RETURN 15FT ADLT (MISCELLANEOUS) ×2 IMPLANT
ENDOLOOP SUT PDS II  0 18 (SUTURE)
ENDOLOOP SUT PDS II 0 18 (SUTURE) IMPLANT
GAUZE SPONGE 4X4 12PLY STRL (GAUZE/BANDAGES/DRESSINGS) IMPLANT
GLOVE BIO SURGEON STRL SZ 6 (GLOVE) ×4 IMPLANT
GLOVE INDICATOR 6.5 STRL GRN (GLOVE) ×4 IMPLANT
GOWN STRL REUS W/TWL LRG LVL3 (GOWN DISPOSABLE) ×4 IMPLANT
GOWN STRL REUS W/TWL XL LVL3 (GOWN DISPOSABLE) ×4 IMPLANT
HANDLE SUCTION POOLE (INSTRUMENTS) IMPLANT
KIT SIGMOIDOSCOPE (SET/KITS/TRAYS/PACK) ×1 IMPLANT
KIT TURNOVER KIT A (KITS) IMPLANT
NDL INSUFFLATION 14GA 120MM (NEEDLE) ×1 IMPLANT
NEEDLE INSUFFLATION 14GA 120MM (NEEDLE) ×2 IMPLANT
PACK COLON (CUSTOM PROCEDURE TRAY) ×2 IMPLANT
PAD POSITIONING PINK XL (MISCELLANEOUS) IMPLANT
PENCIL SMOKE EVACUATOR (MISCELLANEOUS) IMPLANT
PORT LAP GEL ALEXIS MED 5-9CM (MISCELLANEOUS) IMPLANT
RELOAD STAPLE 60 2.6 WHT THN (STAPLE) IMPLANT
RELOAD STAPLER WHITE 60MM (STAPLE) IMPLANT
RTRCTR WOUND ALEXIS 18CM MED (MISCELLANEOUS) ×2
SCISSORS LAP 5X35 DISP (ENDOMECHANICALS) ×2 IMPLANT
SET IRRIG TUBING LAPAROSCOPIC (IRRIGATION / IRRIGATOR) ×2 IMPLANT
SET TUBE SMOKE EVAC HIGH FLOW (TUBING) ×2 IMPLANT
SHEARS HARMONIC ACE PLUS 36CM (ENDOMECHANICALS) ×2 IMPLANT
SLEEVE XCEL OPT CAN 5 100 (ENDOMECHANICALS) ×4 IMPLANT
SPONGE LAP 18X18 RF (DISPOSABLE) IMPLANT
STAPLER ECHELON LONG 60 440 (INSTRUMENTS) IMPLANT
STAPLER ECHELON POWER CIR 29 (STAPLE) ×1 IMPLANT
STAPLER RELOAD WHITE 60MM (STAPLE)
STAPLER VISISTAT 35W (STAPLE) IMPLANT
SUCTION POOLE HANDLE (INSTRUMENTS) ×2
SUT MNCRL AB 4-0 PS2 18 (SUTURE) ×4 IMPLANT
SUT PDS AB 0 CT1 36 (SUTURE) IMPLANT
SUT PDS AB 1 CT1 27 (SUTURE) ×4 IMPLANT
SUT PDS AB 1 CTX 36 (SUTURE) IMPLANT
SUT PDS AB 1 TP1 96 (SUTURE) ×2 IMPLANT
SUT PROLENE 2 0 KS (SUTURE) IMPLANT
SUT PROLENE 2 0 SH DA (SUTURE) IMPLANT
SUT SILK 2 0 (SUTURE) ×2
SUT SILK 2 0 SH CR/8 (SUTURE) ×2 IMPLANT
SUT SILK 2-0 18XBRD TIE 12 (SUTURE) ×1 IMPLANT
SUT SILK 3 0 (SUTURE) ×2
SUT SILK 3 0 SH CR/8 (SUTURE) ×4 IMPLANT
SUT SILK 3-0 18XBRD TIE 12 (SUTURE) ×1 IMPLANT
SUT VIC AB 2-0 SH 27 (SUTURE)
SUT VIC AB 2-0 SH 27X BRD (SUTURE) IMPLANT
SUT VIC AB 3-0 SH 27 (SUTURE)
SUT VIC AB 3-0 SH 27XBRD (SUTURE) IMPLANT
SYS LAPSCP GELPORT 120MM (MISCELLANEOUS)
SYSTEM LAPSCP GELPORT 120MM (MISCELLANEOUS) IMPLANT
TOWEL OR NON WOVEN STRL DISP B (DISPOSABLE) ×2 IMPLANT
TRAY FOLEY MTR SLVR 16FR STAT (SET/KITS/TRAYS/PACK) IMPLANT
TROCAR BLADELESS OPT 5 100 (ENDOMECHANICALS) ×2 IMPLANT
TROCAR XCEL 12X100 BLDLESS (ENDOMECHANICALS) IMPLANT
TUBING CONNECTING 10 (TUBING) ×4 IMPLANT

## 2019-07-30 NOTE — Progress Notes (Signed)
Per pt request CPAP order deleted.  Pt does not want to use CPAP tonight or be charged for it.

## 2019-07-30 NOTE — H&P (Addendum)
Surgical H&P  CC: colostomy  HPI: s/p Hartmans for refractory perforated diverticulitis (7/10). Colonoscopy done last month normal (noted inspissated mucus in proximal rectum). Presents for [employer]. Denies any recent health issues.   No Known Allergies  Past Medical History:  Diagnosis Date  . Anxiety   . Back pain   . CAD (coronary artery disease)   . Chest pain   . Complication of anesthesia   . Constipation   . Diverticulitis   . Diverticulosis   . Heart attack ([place]) 2010  . History of carpal tunnel syndrome    Bilateral  . History of kidney stones   . Hyperlipidemia   . Hypertension   . Joint pain   . Migraines   . PONV (postoperative nausea and vomiting)   . Skin cancer    on nose  . Sleep apnea    wears CPAP nightly  . Vitamin D deficiency     Past Surgical History:  Procedure Laterality Date  . APPENDECTOMY    . APPLICATION OF WOUND VAC N/A 02/09/2019   Procedure: APPLICATION OF WOUND VAC;  Surgeon: Clovis Riley, MD;  Location: [place];  Service: General;  Laterality: N/A;  . BACK SURGERY    . CARPAL TUNNEL RELEASE Right   . CARPAL TUNNEL RELEASE Left 08/13/2016   Procedure: LEFT CARPAL TUNNEL RELEASE;  Surgeon: Roseanne Kaufman, MD;  Location: [place];  Service: Orthopedics;  Laterality: Left;  . COLECTOMY WITH COLOSTOMY CREATION/HARTMANN PROCEDURE N/A 02/09/2019   Procedure: HARTMANN PROCEDURE;  Surgeon: Clovis Riley, MD;  Location: [place];  Service: General;  Laterality: N/A;  . COLONOSCOPY    . COLOSTOMY N/A 02/09/2019   Procedure: COLOSTOMY;  Surgeon: Clovis Riley, MD;  Location: [place];  Service: General;  Laterality: N/A;  . CORONARY STENT PLACEMENT     2 stents in the same artery  . ENDOMETRIAL ABLATION  2005  . EXTRACORPOREAL SHOCK WAVE LITHOTRIPSY Right 06/27/2017   Procedure: RIGHT EXTRACORPOREAL SHOCK WAVE LITHOTRIPSY (ESWL);  Surgeon: Franchot Gallo, MD;  Location: [facility];  Service: Urology;  Laterality:  Right;  . KNEE ARTHROSCOPY WITH MENISCAL REPAIR Right   . MOHS SURGERY    . TUBAL LIGATION  1991    Family History  Problem Relation Age of Onset  . Hyperlipidemia Mother   . Hypertension Mother   . Cancer Father        skin  . Hyperlipidemia Father   . Dementia Father   . Arthritis Father   . Hypertension Father   . Sleep apnea Father   . Other Sister        Myelofibrosis  . Arthritis Sister   . Hyperlipidemia Sister   . Arthritis Sister   . Hyperlipidemia Sister   . Diabetes Paternal Grandfather   . Colon cancer Neg Hx     Social History   Socioeconomic History  . Marital status: Single    Spouse name: Not on file  . Number of children: Not on file  . Years of education: Not on file  . Highest education level: Not on file  Occupational History  . 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 Smoking status: Former Smoker    Packs/day: 1.00    Years: 20.00    Pack years: 20.00    Types: Cigarettes    Quit date: 2010    Years since quitting: 10.9  .  Smokeless tobacco: Never Used  Substance and Sexual Activity  . Alcohol use: Not Currently    Comment: Rare   . Drug use: No  . Sexual activity: Yes    Birth control/protection: Post-menopausal  Other Topics Concern  . Not on file  Social History Narrative   Caffeine daily    Social Determinants of Health   Financial Resource Strain:   . Difficulty of Paying Living Expenses: Not on file  Food Insecurity:   . Worried About Charity fundraiser in the Last Year: Not on file  . Ran Out of Food in the Last Year: Not on file  Transportation Needs:   . Lack of Transportation (Medical): Not on file  . Lack of Transportation (Non-Medical): Not on file  Physical Activity:   . Days of Exercise per Week: Not on file  . Minutes of Exercise per Session: Not on file  Stress:   . Feeling of Stress : Not on file  Social Connections:   .  Frequency of Communication with Friends and Family: Not on file  . Frequency of Social Gatherings with Friends and Family: Not on file  . Attends Religious Services: Not on file  . Active Member of Clubs or Organizations: Not on file  . Attends Archivist Meetings: Not on file  . Marital Status: Not on file    No current facility-administered medications on file prior to encounter.   Current Outpatient Medications on File Prior to Encounter  Medication Sig Dispense Refill  . acetaminophen (TYLENOL) 500 MG tablet Take 500-1,000 mg by mouth every 6 (six) hours as needed for moderate pain.     [name] ALPRAZolam (XANAX) 0.25 MG tablet Take 1 tablet (0.25 mg total) by mouth daily as needed for anxiety. 30 tablet 0  . Ascorbic Acid (VITAMIN C) 100 MG tablet Take 100 mg by mouth daily.    [name] aspirin EC 325 MG tablet Take 325 mg by mouth daily.    . Biotin 1 MG CAPS Take 1 capsule by mouth daily.     . busPIRone (BUSPAR) 15 MG tablet Take 1 tablet (15 mg total) by mouth 3 (three) times daily. 90 tablet 5  . Coenzyme Q10 (CO Q10) 100 MG CAPS Take 1 capsule by mouth daily.    [name] loratadine (CLARITIN) 10 MG tablet Take 10 mg by mouth daily.    [name] losartan-hydrochlorothiazide (HYZAAR) 100-25 MG tablet Take 1 tablet by mouth daily. 90 tablet 2  . metoprolol tartrate (LOPRESSOR) 25 MG tablet TAKE 1 TABLET BY MOUTH 2 TIMES DAILY. (Patient taking differently: Take 25 mg by mouth 2 (two) times daily. ) 180 tablet 3  . nitroGLYCERIN (NITROSTAT) 0.4 MG SL tablet 1 TABLET UNDER TONGUE EVERY 5 MINUTES UP TO 3 TIMES FOR CHEST PAIN THEN CALL DR IF NO RELIEF 25 tablet 3  . risperiDONE (RISPERDAL) 0.5 MG tablet TAKE 1 TABLET BY MOUTH AT BEDTIME 90 tablet 1  . rosuvastatin (CRESTOR) 10 MG tablet Take 1 tablet (10 mg total) by mouth daily. 90 tablet 1  . VASCEPA 1 g CAPS TAKE 2 CAPULES BY MOUTH TWICE DAILY (Patient taking differently: Take 2 g by mouth 2 (two) times daily. ) 360 capsule 2  . Vitamin D,  Ergocalciferol, (DRISDOL) 1.25 MG (50000 UT) CAPS capsule Take 1 capsule (50,000 Units total) by mouth every 7 (seven) days. (Patient taking differently: Take 50,000 Units by mouth every Sunday. ) 12 capsule 3  . Zinc 50 MG CAPS Take 50 mg  by mouth daily.       Review of Systems: a complete, 10pt review of systems was completed with pertinent positives and negatives as documented in the HPI  Physical Exam: Vitals:   07/30/19 0547  BP: (!) 143/73  Pulse: 68  Resp: 15  Temp: 98 F (36.7 C)  SpO2: 99%   Gen: A&Ox3, no distress    CBC Latest Ref Rng & Units 07/23/2019 04/27/2019 02/21/2019  WBC 4.0 - 10.5 K/uL 10.5 10.3 16.5(H)  Hemoglobin 12.0 - 15.0 g/dL 15.2(H) 13.6 12.6  Hematocrit 36.0 - 46.0 % 48.3(H) 42.8 39.6  Platelets 150 - 400 K/uL 232 235 370    CMP Latest Ref Rng & Units 07/23/2019 04/27/2019 02/21/2019  Glucose 70 - 99 mg/dL 106(H) 105(H) 87  BUN 6 - 20 mg/dL 13 15 14   Creatinine 0.44 - 1.00 mg/dL 0.67 0.57 0.58  Sodium 135 - 145 mmol/L 138 141 139  Potassium 3.5 - 5.1 mmol/L 3.9 3.6 4.7  Chloride 98 - 111 mmol/L 100 99 97  CO2 22 - 32 mmol/L 28 28 23   Calcium 8.9 - 10.3 mg/dL 9.6 9.7 10.1  Total Protein 6.5 - 8.1 g/dL 7.5 6.7 -  Total Bilirubin 0.3 - 1.2 mg/dL 0.2(L) <0.2 -  Alkaline Phos 38 - 126 U/L 61 73 -  AST 15 - 41 U/L 19 11 -  ALT 0 - 44 U/L 15 8 -    Lab Results  Component Value Date   INR 1.2 02/03/2019   INR 1.3 (H) 01/28/2019   INR 1.0 01/26/2019    Imaging: No results found.   A/P: We previously discussed technique and perioperative course of hartmans reversal, including laparoscopic versus open approach, risks of bleeding, wound infection or intra-abdominal infection, pain, scarring, injury to intra-abdominal structures, anastomotic leak, need for diverting loop ileostomy, incisional hernia, ileus, bowel obstruction, DVT/PE, heart attack, pneumonia, physical deconditioning, death. Questions were welcomed and answered. To OR as planned.    Patient Active Problem List   Diagnosis Date Noted  . Anemia of pregnancy 02/20/2019  . Bacterial vaginosis 02/20/2019  . Cyst of ovary 02/20/2019  . Kidney stone 02/20/2019  . Migraine 02/20/2019  . Hypertensive disorder 02/20/2019  . Diverticulitis 01/27/2019  . Hypokalemia   . Hypomagnesemia   . Intra-abdominal abscess ([place])   . Left lower quadrant abdominal pain   . Actinic keratoses 05/04/2017  . History of MI (myocardial infarction) 04/01/2017  . Class 1 obesity without serious comorbidity with body mass index (BMI) of 34.0 to 34.9 in adult 11/03/2016  . Depression 09/28/2016  . Prediabetes 09/28/2016  . Vitamin D deficiency 08/16/2016  . Recurrent major depressive disorder, in partial remission ([place]) 05/25/2016  . Adjustment disorder 05/25/2016  . Myalgia 05/25/2016  . Anxiety 05/04/2016  . Coronary arteriosclerosis 05/04/2016  . Diverticular disease 05/04/2016  . Malignant neoplasm of skin 05/04/2016  . Sleep apnea 05/04/2016  . Carpal tunnel syndrome 02/26/2016  . Obesity (BMI 30-39.9) 02/25/2016  . Insomnia 07/01/2015  . Essential hypertension 05/21/2015  . Generalized anxiety disorder 06/06/2013  . CAD S/P percutaneous coronary angioplasty 09/13/2011  . Antiplatelet or antithrombotic long-term use 09/13/2011  . Acute diverticulitis 09/13/2011  . OSA on CPAP 09/13/2011  . Hyperlipidemia 09/13/2011  . Myocardial infarction [facility]) 05/04/2009       Romana Juniper, MD [facility], [place]  See AMION to contact appropriate on-call provider

## 2019-07-30 NOTE — Op Note (Signed)
Operative Note  GEORGEANA RINDAL  LA:[id number]  LF:[id number]  07/30/2019   Surgeon: Victorino Sparrow ConnorMD  Assistant: Autumn Messing MD  Procedure performed: Laparoscopic reversal of end colostomy with coloproctostomy, laparoscopic lysis of adhesions x 120 minutes, laparoscopic mobilization of splenic flexure  Preop diagnosis: end colostomy Post-op diagnosis/intraop findings: inspissated mucus in the distal rectum and thin distal donut, anastomosis oversewn with interrupted 3-0 silks  Specimens: colostomy Retained items: no EBL: 50 cc Complications: none  Description of procedure: After obtaining informed consent the patient was taken to the operating room and placed supine on operating room table wheregeneral endotracheal anesthesia was initiated, preoperative antibiotics were administered, SCDs applied, and a formal timeout was performed.  Foley catheter was inserted.  The patient was placed in the dorsal lithotomy position with all pressure points appropriately padded.  The abdomen was prepped and draped in usual sterile fashion.  Peritoneal access was gained using an optical entry in the left upper quadrant and insufflation to 15 mmHg ensued without incident.  Under direct visualization, 3 additional right-sided trochars were placed.  Careful adhesiolysis was undertaken, using cold scissors for the vast majority of the dissection with the harmonic scalpel was employed to take down omental adhesions where safe.  Adhesions to the anterior abdominal wall, colostomy, pelvic structures and rectal stump were carefully divided until all the small bowel could be reflected cephalad out of the pelvis.  Adhesions of the adnexa and peritoneum to the rectum were carefully taken down sharply and the stay sutures of Prolene that had been placed at the initial operation were removed.  We then turned to the colostomy in the left upper quadrant.  This was freed of adhesions of omentum and small bowel.  At this  juncture we made an incision in the skin around the colostomy and then cautery and blunt dissection were employed to mobilize the bowel from its adhesions to the surrounding soft tissue until it was completely freed from the abdominal wall.  About 5 cm of distal colon was excised in a pursestring suture placed in the freshened distal colon.  Sizers were employed and it was noted that it would easily accommodate a 29 mm anvil which was then inserted and secured with the pursestring.  Fatty tissue was cleaned away from the serosa ensuring no injury to the bowel wall.  This was then reinserted into the abdominal cavity and an Alexis wound protector and trocar were placed.  Further omental adhesions to the descending colon were taken down.  The descending colon was mobilized medially by dividing the lateral attachments.  The splenic flexure was mobilized fully.  At this point the colon was able to reach into the pelvis.  My partner went below and was able to dilate the rectum to a 29 mm sizer.  A 29 mm EEA stapler was then inserted in the spike brought out through the anterior rectal wall, which was somewhat thickened.  The anvil was connected to the spike and the stapler was closed, held for 60 seconds and then deployed.  On inspection of the donuts, the proximal doughnut appeared well intact however the distal donut was a thin ring of mucosa with entrapped inspissated mucus.  On further inspection of her anastomosis, there was a serosal separation of the proximal rectum just distal to the anastomosis.  At this juncture a low midline incision was made and the anastomosis oversewn with interrupted 3-0 silk sutures including at least the anterior 75% of the anastomotic circumference.  The serosal  tear on the proximal rectum was repaired in a similar fashion.  The descending colon was then gently occluded in the pelvis flooded with saline.  The rigid sigmoidoscope was inserted and the rectum and descending colon tensely  insufflated with air.  No bubbles or evidence of leak was seen.  The anastomosis appeared well perfused, without significant tension.  Omentum was brought back down to cover the anastomosis.  At this point all gowns, gloves, and instruments were exchanged in preparation for closure.  The colostomy site was closed vertically with #1 PDS and the skin cinched down with a pursestring 3-0 Vicryl and the wound packed.  The low midline incision was closed with running looped #1 PDS starting at either end and tying centrally.  The skin here was loosely closed with staples and a honeycomb dressing applied.  The 4 port sites were closed with Monocryl and Dermabond. The patient was then returned to the supine position, awakened, extubated and taken to PACU in stable condition.   All counts were correct at the completion of the case.   I updated her daughter, Joellen Jersey, at the end of the case.

## 2019-07-30 NOTE — Progress Notes (Signed)
   07/30/19 1330  PACU Vital Signs  Pulse Rate 79  ECG Heart Rate 80  Resp 16  BP (!) 152/63  Oxygen Therapy  SpO2 100 %  O2 Device Simple Mask  O2 Flow Rate (L/min) 10 L/min  Pulse Oximetry Type Continuous  Respiratory  Airway LDA Other (Comment) (nasal airway removed)

## 2019-07-30 NOTE — Progress Notes (Signed)
Snoring resp on arrival to PACU, then became apneic; bag valve mask by Arlee Muslim, CRNA; then oral airway inserted

## 2019-07-30 NOTE — Anesthesia Procedure Notes (Signed)
Procedure Name: Intubation Performed by: Shay Bartoli J, CRNA Pre-anesthesia Checklist: Patient identified, Emergency Drugs available, Suction available, Patient being monitored and Timeout performed Patient Re-evaluated:Patient Re-evaluated prior to induction Oxygen Delivery Method: Circle system utilized Preoxygenation: Pre-oxygenation with 100% oxygen Induction Type: IV induction Ventilation: Mask ventilation without difficulty Laryngoscope Size: Mac and 4 Grade View: Grade II Tube type: Oral Tube size: 7.0 mm Number of attempts: 1 Airway Equipment and Method: Stylet Placement Confirmation: ETT inserted through vocal cords under direct vision,  positive ETCO2 and breath sounds checked- equal and bilateral Secured at: 21 cm Tube secured with: Tape Dental Injury: Teeth and Oropharynx as per pre-operative assessment        

## 2019-07-30 NOTE — Progress Notes (Signed)
Nasal airway inserted by Arlee Muslim, CRNA

## 2019-07-30 NOTE — Anesthesia Postprocedure Evaluation (Signed)
Anesthesia Post Note  Patient: Heather Lam  Procedure(s) Performed: LAPAROSCOPIC ASSISTED REVERSAL OF END COLOSTOMY WITH RIGID PROCTOSCOPY (N/A )     Patient location during evaluation: PACU Anesthesia Type: General Level of consciousness: sedated and patient cooperative Pain management: pain level controlled Vital Signs Assessment: post-procedure vital signs reviewed and stable Respiratory status: spontaneous breathing Cardiovascular status: stable Anesthetic complications: no    Last Vitals:  Vitals:   07/30/19 1450 07/30/19 1551  BP: 140/72 (!) 151/69  Pulse: 86 83  Resp: 15 15  Temp: (!) 36.3 C 36.5 C  SpO2: 97% 98%    Last Pain:  Vitals:   07/30/19 1551  TempSrc: Oral  PainSc:                  Nolon Nations

## 2019-07-30 NOTE — Transfer of Care (Signed)
Immediate Anesthesia Transfer of Care Note  Patient: Heather Lam  Procedure(s) Performed: LAPAROSCOPIC ASSISTED REVERSAL OF END COLOSTOMY WITH RIGID PROCTOSCOPY (N/A )  Patient Location: PACU  Anesthesia Type:General  Level of Consciousness: unresponsive, patient cooperative and responds to stimulation  Airway & Oxygen Therapy: Patient Spontanous Breathing, Patient connected to face mask oxygen and nasal airway placed for pt history of sleep apnaea  Post-op Assessment: Report given to RN and Post -op Vital signs reviewed and stable  Post vital signs: Reviewed and stable  Last Vitals:  Vitals Value Taken Time  BP 140/96 07/30/19 1251  Temp    Pulse 78 07/30/19 1259  Resp 29 07/30/19 1259  SpO2 96 % 07/30/19 1259  Vitals shown include unvalidated device data.  Last Pain:  Vitals:   07/30/19 0547  TempSrc: Oral         Complications: No apparent anesthesia complications

## 2019-07-30 NOTE — Progress Notes (Signed)
Airways removed resps even, unlabored

## 2019-07-31 LAB — BASIC METABOLIC PANEL
Anion gap: 8 (ref 5–15)
BUN: 12 mg/dL (ref 6–20)
CO2: 26 mmol/L (ref 22–32)
Calcium: 8.3 mg/dL — ABNORMAL LOW (ref 8.9–10.3)
Chloride: 105 mmol/L (ref 98–111)
Creatinine, Ser: 0.63 mg/dL (ref 0.44–1.00)
GFR calc Af Amer: >60 mL/min (ref >60)
GFR calc non Af Amer: >60 mL/min (ref >60)
Glucose, Bld: 118 mg/dL — ABNORMAL HIGH (ref 70–99)
Potassium: 4.5 mmol/L (ref 3.5–5.1)
Sodium: 139 mmol/L (ref 135–145)

## 2019-07-31 LAB — CBC
HCT: 39.5 % (ref 36.0–46.0)
Hemoglobin: 12.5 g/dL (ref 12.0–15.0)
MCH: 27.3 pg (ref 26.0–34.0)
MCHC: 31.6 g/dL (ref 30.0–36.0)
MCV: 86.2 fL (ref 80.0–100.0)
Platelets: 194 10*3/uL (ref 150–400)
RBC: 4.58 MIL/uL (ref 3.87–5.11)
RDW: 15 % (ref 11.5–15.5)
WBC: 17.1 10*3/uL — ABNORMAL HIGH (ref 4.0–10.5)
nRBC: 0 % (ref 0.0–0.2)

## 2019-07-31 LAB — MAGNESIUM: Magnesium: 1.6 mg/dL — ABNORMAL LOW (ref 1.7–2.4)

## 2019-07-31 LAB — SURGICAL PATHOLOGY

## 2019-07-31 MED ORDER — MAGNESIUM SULFATE 4 GM/100ML IV SOLN
4.0000 g | Freq: Once | INTRAVENOUS | Status: DC
  Filled 2019-07-31: qty 100

## 2019-07-31 NOTE — Progress Notes (Signed)
1 Day Post-Op   Subjective/Chief Complaint: No acute events. Pain well controlled. Denies nausea. No flatus.    Objective: Vital signs in last 24 hours: Temp:  [97.4 F (36.3 C)-98.4 F (36.9 C)] 98.4 F (36.9 C) (12/29 0525) Pulse Rate:  [74-87] 75 (12/29 0525) Resp:  [9-21] 16 (12/29 0525) BP: (108-160)/(58-96) 108/58 (12/29 0525) SpO2:  [84 %-100 %] 92 % (12/29 0525) Weight:  [89.1 kg-93 kg] 93 kg (12/29 0619) Last BM Date: 07/29/19  Intake/Output from previous day: 12/28 0701 - 12/29 0700 In: 4532.7 [I.V.:4432.7; IV Piggyback:100] Out: 1050 [Urine:950; Blood:100] Intake/Output this shift: No intake/output data recorded.  General appearance: alert and cooperative Resp: unlabored GI: soft, minimally/ appropriately tender. incisions c/d/i without cellulitis or hematoma. blood staining on stoma site dressing and lower margin of honeycomb.   Lab Results:  Recent Labs    07/31/19 0416  WBC 17.1*  HGB 12.5  HCT 39.5  PLT 194   BMET Recent Labs    07/31/19 0416  NA 139  K 4.5  CL 105  CO2 26  GLUCOSE 118*  BUN 12  CREATININE 0.63  CALCIUM 8.3*   PT/INR No results for input(s): LABPROT, INR in the last 72 hours. ABG No results for input(s): PHART, HCO3 in the last 72 hours.  Invalid input(s): PCO2, PO2  Studies/Results: No results found.  Anti-infectives: Anti-infectives (From admission, onward)   Start     Dose/Rate Route Frequency Ordered Stop   07/30/19 2000  cefoTEtan (CEFOTAN) 2 g in sodium chloride 0.9 % 100 mL IVPB     2 g 200 mL/hr over 30 Minutes Intravenous Every 12 hours 07/30/19 1451 07/30/19 2132   07/30/19 1400  neomycin (MYCIFRADIN) tablet 1,000 mg  Status:  Discontinued     1,000 mg Oral 3 times per day 07/30/19 0536 07/30/19 0540   07/30/19 1400  metroNIDAZOLE (FLAGYL) tablet 1,000 mg  Status:  Discontinued     1,000 mg Oral 3 times per day 07/30/19 0536 07/30/19 0540   07/30/19 0600  cefoTEtan (CEFOTAN) 2 g in sodium chloride 0.9 %  100 mL IVPB     2 g 200 mL/hr over 30 Minutes Intravenous On call to O.R. 07/30/19 0536 07/30/19 0823      Assessment/Plan:  07/30/19 s/p Procedure(s): LAPAROSCOPIC ASSISTED REVERSAL OF END COLOSTOMY WITH RIGID PROCTOSCOPY (N/A), lysis of adhesions x 2 hrs, mobilization of splenic flexure  Post-op leukocytosis- 17.1, expected, afebrile, continue to monitor Acute blood loss anemia- (12.5 from 15.2 preop); VSS, uop appropriate, continue to monitor Hypomagnesemia 1.6- replace IV Sleep apnea- per RT notes pt declined CPAP last night.   HTN, CAD/ hx MI (remote)- continue home metoprolol, hold losartan/hctz, hold ASA  Continue clear liquids as tolerated today. Continue foley for urine output monitoring today. Ambulate, pulmonary toilet. Replace magnesium. Recheck labs in AM.     LOS: 1 day    Heather Lam 07/31/2019

## 2019-07-31 NOTE — Progress Notes (Signed)
Physical Therapy Treatment Patient Details Name: Heather Lam MRN: [id number] DOB: Oct 03, 1960 Today's Date: 07/31/2019    History of Present Illness s/p Hartmans for refractory perforated diverticulitis. Presents for [employer] reversal. PMH: HTN, CAD, OSA    PT Comments    Progressing well. Improving transitions, improving tolerance to activity  Follow Up Recommendations  No PT follow up     Equipment Recommendations  None recommended by PT    Recommendations for Other Services       Precautions / Restrictions Precautions Precautions: None Restrictions Weight Bearing Restrictions: No    Mobility  Bed Mobility Overal bed mobility: Needs Assistance Bed Mobility: Supine to Sit;Sit to Supine     Supine to sit: HOB elevated;Min guard Sit to supine: Min guard;HOB elevated   General bed mobility comments: for safety and lines  Transfers Overall transfer level: Needs assistance Equipment used: Rolling walker (2 wheeled) Transfers: Sit to/from Stand Sit to Stand: Min guard;Supervision         General transfer comment: cues for hand  placement and overall safety  Ambulation/Gait Ambulation/Gait assistance: Min Gaffer (Feet): 260 Feet Assistive device: Rolling walker (2 wheeled) Gait Pattern/deviations: Step-through pattern     General Gait Details: for safety, cues for trunk extension   Stairs             Wheelchair Mobility    Modified Rankin (Stroke Patients Only)       Balance Overall balance assessment: No apparent balance deficits (not formally assessed)                                          Cognition Arousal/Alertness: Awake/alert Behavior During Therapy: WFL for tasks assessed/performed Overall Cognitive Status: Within Functional Limits for tasks assessed                                        Exercises      General Comments        Pertinent Vitals/Pain Pain  Assessment: Faces Pain Score: 4  Faces Pain Scale: Hurts little more Pain Location: abd with movement/bed mobility Pain Descriptors / Indicators: Sore Pain Intervention(s): Limited activity within patient's tolerance;Monitored during session;Premedicated before session    Home Living Family/patient expects to be discharged to:: Private residence Living Arrangements: Alone Available Help at Discharge: Family;Available 24 hours/day Type of Home: House Home Access: Ramped entrance   Home Layout: One level Home Equipment: Walker - 2 wheels Additional Comments: Pt plans to d/c to her mother's house.    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   PT Goals (current goals can now be found in the care plan section) Acute Rehab PT Goals Patient Stated Goal: feel better, go home PT Goal Formulation: With patient Time For Goal Achievement: 08/14/19 Potential to Achieve Goals: Good Progress towards PT goals: Progressing toward goals    Frequency    Min 3X/week      PT Plan Current plan remains appropriate    Co-evaluation              AM-PAC PT "6 Clicks" Mobility   Outcome Measure  Help needed turning from your back to your side while in a  flat bed without using bedrails?: A Little Help needed moving from lying on your back to sitting on the side of a flat bed without using bedrails?: A Little Help needed moving to and from a bed to a chair (including a wheelchair)?: A Little Help needed standing up from a chair using your arms (e.g., wheelchair or bedside chair)?: None Help needed to walk in hospital room?: A Little Help needed climbing 3-5 steps with a railing? : A Little 6 Click Score: 19    End of Session Equipment Utilized During Treatment: Gait belt Activity Tolerance: Patient tolerated treatment well Patient left: in bed;with call bell/phone within reach Nurse  Communication: Mobility status PT Visit Diagnosis: Other abnormalities of gait and mobility (R26.89)     Time: [id number] PT Time Calculation (min) (ACUTE ONLY): 18 min  Charges:  $Gait Training: 8-22 mins                     Baxter Flattery, PT   Acute Rehab Dept [facility]): [phone]   07/31/2019    [facility] 07/31/2019, 4:44 PM

## 2019-07-31 NOTE — Evaluation (Signed)
Physical Therapy Evaluation Patient Details Name: Heather Lam MRN: [id number] DOB: 1960/10/12 Today's Date: 07/31/2019   History of Present Illness  s/p Hartmans for refractory perforated diverticulitis. Presents for [employer] reversal. PMH: HTN, CAD, OSA  Clinical Impression  Pt admitted with above diagnosis.  Pt doing very well, motivated to amb, get better and go home.  Pt currently with functional limitations due to the deficits listed below (see PT Problem List). Pt will benefit from skilled PT to increase their independence and safety with mobility to allow discharge to the venue listed below.     Follow Up Recommendations No PT follow up    Equipment Recommendations  None recommended by PT    Recommendations for Other Services       Precautions / Restrictions Precautions Precautions: None Restrictions Weight Bearing Restrictions: No      Mobility  Bed Mobility Overal bed mobility: Needs Assistance Bed Mobility: Supine to Sit     Supine to sit: HOB elevated;Min guard     General bed mobility comments: for safety and lines  Transfers Overall transfer level: Needs assistance Equipment used: Rolling walker (2 wheeled) Transfers: Sit to/from Stand Sit to Stand: Min guard;Supervision         General transfer comment: cues for hand  placement and overall safety  Ambulation/Gait Ambulation/Gait assistance: Min Gaffer (Feet): 250 Feet Assistive device: Rolling walker (2 wheeled) Gait Pattern/deviations: Step-through pattern     General Gait Details: for safety  Stairs            Wheelchair Mobility    Modified Rankin (Stroke Patients Only)       Balance                                             Pertinent Vitals/Pain Pain Assessment: 0-10 Pain Score: 4  Pain Location: abd with movement/bed mobility Pain Descriptors / Indicators: Sore Pain Intervention(s): Limited activity within patient's  tolerance;Monitored during session    Home Living Family/patient expects to be discharged to:: Private residence Living Arrangements: Alone Available Help at Discharge: Family;Available 24 hours/day Type of Home: House Home Access: Ramped entrance   Entrance Stairs-Number of Steps: 3 Home Layout: One level Home Equipment: Walker - 2 wheels Additional Comments: Pt plans to d/c to her mother's house.    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     Hand Dominance        Extremity/Trunk Assessment   Upper Extremity Assessment Upper Extremity Assessment: Overall WFL for tasks assessed    Lower Extremity Assessment Lower Extremity Assessment: Overall WFL for tasks assessed       Communication   Communication: No difficulties  Cognition Arousal/Alertness: Awake/alert Behavior During Therapy: WFL for tasks assessed/performed Overall Cognitive Status: Within Functional Limits for tasks assessed                                        General Comments      Exercises     Assessment/Plan    PT Assessment Patient needs continued PT services  PT Problem List Decreased activity tolerance;Decreased mobility;Decreased safety awareness  PT Treatment Interventions DME instruction;Therapeutic activities;Functional mobility training;Gait training;Therapeutic exercise;Patient/family education    PT Goals (Current goals can be found in the Care Plan section)  Acute Rehab PT Goals PT Goal Formulation: With patient Time For Goal Achievement: 08/14/19 Potential to Achieve Goals: Good    Frequency Min 3X/week   Barriers to discharge        Co-evaluation               AM-PAC PT "6 Clicks" Mobility  Outcome Measure Help needed turning from your back to your side while in a flat bed without using bedrails?: A Little Help needed moving from lying on your  back to sitting on the side of a flat bed without using bedrails?: A Little Help needed moving to and from a bed to a chair (including a wheelchair)?: A Little Help needed standing up from a chair using your arms (e.g., wheelchair or bedside chair)?: None Help needed to walk in hospital room?: A Little Help needed climbing 3-5 steps with a railing? : A Little 6 Click Score: 19    End of Session Equipment Utilized During Treatment: Gait belt Activity Tolerance: Patient tolerated treatment well Patient left: in bed;with call bell/phone within reach Nurse Communication: Mobility status PT Visit Diagnosis: Other abnormalities of gait and mobility (R26.89)    Time: 1000-1024 PT Time Calculation (min) (ACUTE ONLY): 24 min   Charges:   PT Evaluation $PT Eval Low Complexity: 1 Low PT Treatments $Gait Training: 8-22 mins        Heather Lam, PT   Acute Rehab Dept [facility]): [id number]   07/31/2019   [facility] 07/31/2019, 1:32 PM

## 2019-07-31 NOTE — Evaluation (Signed)
Occupational Therapy Evaluation Patient Details Name: Heather Lam MRN: [id number] DOB: February 01, 1961 Today's Date: 07/31/2019    History of Present Illness s/p Hartmans for refractory perforated diverticulitis. Presents for [employer] reversal. PMH: HTN, CAD, OSA   Clinical Impression   Pt with decline in function and safety with ADLs and ADL mobility with impaired endurance and limited by pain at surgical site. Pt live sat home with her husband and was independent with ADLs/selfcare, home mgt, cooking, driving; works as an Therapist, sports in [employer]. Pt currently requires min guard A for bed mobility, min A with LB ADLs and min guard A - sup HHA with mobility. Pt would bene from acute OT services to address impairments to maximize level of function and safety to return home    Follow Up Recommendations  No OT follow up    Equipment Recommendations  Other (comment)(reacher)    Recommendations for Other Services       Precautions / Restrictions Precautions Precautions: None Restrictions Weight Bearing Restrictions: No      Mobility Bed Mobility Overal bed mobility: Needs Assistance Bed Mobility: Supine to Sit;Sit to Supine     Supine to sit: HOB elevated;Min guard Sit to supine: Min guard   General bed mobility comments: for safety and lines  Transfers Overall transfer level: Needs assistance Equipment used: Rolling walker (2 wheeled) Transfers: Sit to/from Stand Sit to Stand: Min guard;Supervision         General transfer comment: cues for hand  placement and overall safety    Balance Overall balance assessment: No apparent balance deficits (not formally assessed)                                         ADL either performed or assessed with clinical judgement   ADL Overall ADL's : Needs assistance/impaired Eating/Feeding: Independent;Sitting   Grooming: Wash/dry hands;Wash/dry face;Supervision/safety;Standing   Upper Body Bathing: Set up;Sitting    Lower Body Bathing: Minimal assistance   Upper Body Dressing : Set up;Sitting   Lower Body Dressing: Minimal assistance   Toilet Transfer: Min guard;Supervision/safety;Ambulation Toilet Transfer Details (indicate cue type and reason): HHA to bathroom Toileting- Clothing Manipulation and Hygiene: Min guard;Sit to/from stand   Tub/ Shower Transfer: Min guard;Supervision/safety;Ambulation   Functional mobility during ADLs: Min guard;Supervision/safety       Vision Baseline Vision/History: Wears glasses Wears Glasses: At all times Patient Visual Report: No change from baseline       Perception     Praxis      Pertinent Vitals/Pain Pain Assessment: Faces Pain Score: 4  Faces Pain Scale: Hurts little more Pain Location: abd with movement/bed mobility Pain Descriptors / Indicators: Sore Pain Intervention(s): Monitored during session;Repositioned     Hand Dominance Right   Extremity/Trunk Assessment Upper Extremity Assessment Upper Extremity Assessment: Overall WFL for tasks assessed   Lower Extremity Assessment Lower Extremity Assessment: Defer to PT evaluation   Cervical / Trunk Assessment Cervical / Trunk Assessment: Normal   Communication Communication Communication: No difficulties   Cognition Arousal/Alertness: Awake/alert Behavior During Therapy: WFL for tasks assessed/performed Overall Cognitive Status: Within Functional Limits for tasks assessed                                     General Comments       Exercises  Shoulder Instructions      Home Living Family/patient expects to be discharged to:: Private residence Living Arrangements: Alone Available Help at Discharge: Family;Available 24 hours/day Type of Home: House Home Access: Ramped entrance Entrance Stairs-Number of Steps: 3   Home Layout: One level     Bathroom Shower/Tub: Teacher, early years/pre: Standard     Home Equipment: Walker - 2 wheels    Additional Comments: Pt plans to d/c to her mother's house.      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        OT Problem List: Pain;Decreased activity tolerance;Decreased knowledge of use of DME or AE      OT Treatment/Interventions: Self-care/ADL training;DME and/or AE instruction;Therapeutic activities;Patient/family education    OT Goals(Current goals can be found in the care plan section) Acute Rehab OT Goals Patient Stated Goal: feel better, go home OT Goal Formulation: With patient Time For Goal Achievement: 08/14/19 Potential to Achieve Goals: Good  OT Frequency: Min 2X/week   Barriers to D/C:    no barriers       Co-evaluation              AM-PAC OT "6 Clicks" Daily Activity     Outcome Measure Help from another person eating meals?: None Help from another person taking care of personal grooming?: None Help from another person toileting, which includes using toliet, bedpan, or urinal?: A Little Help from another person bathing (including washing, rinsing, drying)?: A Little Help from another person to put on and taking off regular upper body clothing?: None Help from another person to put on and taking off regular lower body clothing?: A Little 6 Click Score: 21   End of Session    Activity Tolerance: Patient tolerated treatment well Patient left: in bed;with call bell/phone within reach  OT Visit Diagnosis: Pain;Other abnormalities of gait and mobility (R26.89) Pain - Right/Left: (surgical site) Pain - part of body: (abdomen)                Time: [id number] OT Time Calculation (min): 26 min Charges:  OT General Charges $OT Visit: 1 Visit OT Evaluation $OT Eval Low Complexity: 1 Low OT Treatments $Self Care/Home Management : 8-22 mins    Britt Bottom 07/31/2019, 2:39 PM

## 2019-08-01 LAB — BASIC METABOLIC PANEL
Anion gap: 5 (ref 5–15)
BUN: 13 mg/dL (ref 6–20)
CO2: 26 mmol/L (ref 22–32)
Calcium: 8.1 mg/dL — ABNORMAL LOW (ref 8.9–10.3)
Chloride: 108 mmol/L (ref 98–111)
Creatinine, Ser: 0.55 mg/dL (ref 0.44–1.00)
GFR calc Af Amer: >60 mL/min (ref >60)
GFR calc non Af Amer: >60 mL/min (ref >60)
Glucose, Bld: 94 mg/dL (ref 70–99)
Potassium: 4 mmol/L (ref 3.5–5.1)
Sodium: 139 mmol/L (ref 135–145)

## 2019-08-01 LAB — CBC
HCT: 38.4 % (ref 36.0–46.0)
Hemoglobin: 11.8 g/dL — ABNORMAL LOW (ref 12.0–15.0)
MCH: 27.2 pg (ref 26.0–34.0)
MCHC: 30.7 g/dL (ref 30.0–36.0)
MCV: 88.5 fL (ref 80.0–100.0)
Platelets: 159 10*3/uL (ref 150–400)
RBC: 4.34 MIL/uL (ref 3.87–5.11)
RDW: 15.8 % — ABNORMAL HIGH (ref 11.5–15.5)
WBC: 13.3 10*3/uL — ABNORMAL HIGH (ref 4.0–10.5)
nRBC: 0 % (ref 0.0–0.2)

## 2019-08-01 LAB — MAGNESIUM: Magnesium: 2 mg/dL (ref 1.7–2.4)

## 2019-08-01 NOTE — Progress Notes (Signed)
Physical Therapy Treatment Patient Details Name: Heather Lam MRN: [id number] DOB: [birth year] Today's Date: 08/01/2019    History of Present Illness s/p Hartmans for refractory perforated diverticulitis. Presents for [employer] reversal. PMH: HTN, CAD, OSA    PT Comments    Pt pleasant, motivated and  making excellent progress with mobility. amb without AD today and incr gait distance. Pt on to amb on unit without assist, confirmed with RN--pt to call if assist needed with transitions/OOB. Discussed progression of activity at home. No further PT needs.      Follow Up Recommendations  No PT follow up     Equipment Recommendations  None recommended by PT    Recommendations for Other Services       Precautions / Restrictions Precautions Precautions: None Restrictions Weight Bearing Restrictions: No    Mobility  Bed Mobility Overal bed mobility: Modified Independent             General bed mobility comments: pt reports she will likely sleep in recliner at home initially; pt aware of log roll if needed   Transfers Overall transfer level: Independent   Transfers: Sit to/from Stand Sit to Stand: Independent            Ambulation/Gait Ambulation/Gait assistance: Supervision;Modified independent (Device/Increase time) Gait Distance (Feet): 300 Feet Assistive device: None Gait Pattern/deviations: Step-through pattern     General Gait Details: grossly WFL, no LOB, slightly decr velocity   Stairs             Wheelchair Mobility    Modified Rankin (Stroke Patients Only)       Balance                                            Cognition Arousal/Alertness: Awake/alert Behavior During Therapy: WFL for tasks assessed/performed Overall Cognitive Status: Within Functional Limits for tasks assessed                                        Exercises      General Comments        Pertinent Vitals/Pain Pain  Assessment: Faces Faces Pain Scale: Hurts a little bit Pain Location: abd with movement/bed mobility Pain Descriptors / Indicators: Sore Pain Intervention(s): Limited activity within patient's tolerance;Monitored during session    Home Living                      Prior Function            PT Goals (current goals can now be found in the care plan section) Acute Rehab PT Goals Patient Stated Goal: feel better, go home PT Goal Formulation: With patient Time For Goal Achievement: 08/14/19 Potential to Achieve Goals: Good Progress towards PT goals: Goals met/education completed, patient discharged from PT    Frequency           PT Plan Other (comment)(d/c PT)    Co-evaluation              AM-PAC PT "6 Clicks" Mobility   Outcome Measure  Help needed turning from your back to your side while in a flat bed without using bedrails?: None Help needed moving from lying on your back to sitting on the side of a flat bed without using bedrails?:  None Help needed moving to and from a bed to a chair (including a wheelchair)?: None Help needed standing up from a chair using your arms (e.g., wheelchair or bedside chair)?: None Help needed to walk in hospital room?: None Help needed climbing 3-5 steps with a railing? : A Little 6 Click Score: 23    End of Session   Activity Tolerance: Patient tolerated treatment well Patient left: in bed;with call bell/phone within reach Nurse Communication: Mobility status PT Visit Diagnosis: Other abnormalities of gait and mobility (R26.89)     Time: 7014-1030 PT Time Calculation (min) (ACUTE ONLY): 20 min  Charges:  $Gait Training: 8-22 mins                     Baxter Flattery, PT   Acute Rehab Dept [facility]): [phone]   08/01/2019    [facility] 08/01/2019, 10:55 AM

## 2019-08-01 NOTE — Progress Notes (Signed)
2 Days Post-Op   Subjective/Chief Complaint: Bowel movement last night (liquid, nonobloody, denies flatus). No nausea, some bloating. Did well with mobilizing yesterday. Pain well controlled.    Objective: Vital signs in last 24 hours: Temp:  [97.6 F (36.4 C)-99.6 F (37.6 C)] 97.6 F (36.4 C) (12/30 0534) Pulse Rate:  [63-77] 63 (12/30 0534) Resp:  [16-20] 20 (12/30 0534) BP: (119-155)/(61-80) 155/80 (12/30 0534) SpO2:  [93 %-94 %] 94 % (12/30 0534) Weight:  [92.6 kg] 92.6 kg (12/30 0534) Last BM Date: 07/30/19  Intake/Output from previous day: 12/29 0701 - 12/30 0700 In: 3534.6 [P.O.:770; I.V.:2764.6] Out: 700 [Urine:700] Intake/Output this shift: No intake/output data recorded.  General appearance: alert and cooperative Resp: unlabored GI: soft, nontender, mildly distended. incisions c/d/i without cellulitis or hematoma. blood staining on stoma site dressing and lower margin of honeycomb.   Lab Results:  Recent Labs    07/31/19 0416 08/01/19 0440  WBC 17.1* 13.3*  HGB 12.5 11.8*  HCT 39.5 38.4  PLT 194 159   BMET Recent Labs    07/31/19 0416 08/01/19 0440  NA 139 139  K 4.5 4.0  CL 105 108  CO2 26 26  GLUCOSE 118* 94  BUN 12 13  CREATININE 0.63 0.55  CALCIUM 8.3* 8.1*   PT/INR No results for input(s): LABPROT, INR in the last 72 hours. ABG No results for input(s): PHART, HCO3 in the last 72 hours.  Invalid input(s): PCO2, PO2  Studies/Results: No results found.  Anti-infectives: Anti-infectives (From admission, onward)   Start     Dose/Rate Route Frequency Ordered Stop   07/30/19 2000  cefoTEtan (CEFOTAN) 2 g in sodium chloride 0.9 % 100 mL IVPB     2 g 200 mL/hr over 30 Minutes Intravenous Every 12 hours 07/30/19 1451 07/30/19 2132   07/30/19 1400  neomycin (MYCIFRADIN) tablet 1,000 mg  Status:  Discontinued     1,000 mg Oral 3 times per day 07/30/19 0536 07/30/19 0540   07/30/19 1400  metroNIDAZOLE (FLAGYL) tablet 1,000 mg  Status:   Discontinued     1,000 mg Oral 3 times per day 07/30/19 0536 07/30/19 0540   07/30/19 0600  cefoTEtan (CEFOTAN) 2 g in sodium chloride 0.9 % 100 mL IVPB     2 g 200 mL/hr over 30 Minutes Intravenous On call to O.R. 07/30/19 0536 07/30/19 0823      Assessment/Plan:  07/30/19 s/p Procedure(s): LAPAROSCOPIC ASSISTED REVERSAL OF END COLOSTOMY WITH RIGID PROCTOSCOPY (N/A), lysis of adhesions x 2 hrs, mobilization of splenic flexure  Post-op leukocytosis- expected, downtrending (13.3 from 17.1), afebrile, continue to monitor Acute blood loss anemia- stable (11.8 from 12.5; 15.2 preop); VSS, uop appropriate, continue to monitor Hypomagnesemia - resolved Sleep apnea- per RT notes pt declined CPAP 12/28.   HTN, CAD/ hx MI (remote)- continue home metoprolol, hold losartan/hctz for now, hold ASA  Advance to soft diet today, ensure ongoing bowel function. Continue to ambulate, continue pulmonary toilet.      LOS: 2 days    Clovis Riley 08/01/2019

## 2019-08-02 LAB — BASIC METABOLIC PANEL
Anion gap: 9 (ref 5–15)
BUN: 13 mg/dL (ref 6–20)
CO2: 26 mmol/L (ref 22–32)
Calcium: 8.1 mg/dL — ABNORMAL LOW (ref 8.9–10.3)
Chloride: 104 mmol/L (ref 98–111)
Creatinine, Ser: 0.48 mg/dL (ref 0.44–1.00)
GFR calc Af Amer: >60 mL/min (ref >60)
GFR calc non Af Amer: >60 mL/min (ref >60)
Glucose, Bld: 95 mg/dL (ref 70–99)
Potassium: 3.9 mmol/L (ref 3.5–5.1)
Sodium: 139 mmol/L (ref 135–145)

## 2019-08-02 LAB — CBC
HCT: 37.3 % (ref 36.0–46.0)
Hemoglobin: 11.7 g/dL — ABNORMAL LOW (ref 12.0–15.0)
MCH: 27.1 pg (ref 26.0–34.0)
MCHC: 31.4 g/dL (ref 30.0–36.0)
MCV: 86.5 fL (ref 80.0–100.0)
Platelets: 152 10*3/uL (ref 150–400)
RBC: 4.31 MIL/uL (ref 3.87–5.11)
RDW: 15.6 % — ABNORMAL HIGH (ref 11.5–15.5)
WBC: 11.2 10*3/uL — ABNORMAL HIGH (ref 4.0–10.5)
nRBC: 0 % (ref 0.0–0.2)

## 2019-08-02 LAB — MAGNESIUM: Magnesium: 1.8 mg/dL (ref 1.7–2.4)

## 2019-08-02 MED ORDER — GABAPENTIN 300 MG PO CAPS: 300.0000 mg | ORAL_CAPSULE | Freq: Two times a day (BID) | ORAL | 1 refills | Status: DC

## 2019-08-02 MED ORDER — MAGNESIUM GLUCONATE 500 MG PO TABS
500.0000 mg | ORAL_TABLET | Freq: Every day | ORAL | Status: AC
  Administered 2019-08-02: 500 mg via ORAL
  Filled 2019-08-02: qty 1

## 2019-08-02 MED ORDER — ASPIRIN 81 MG PO TBEC: 81.0000 mg | DELAYED_RELEASE_TABLET | Freq: Every day | ORAL | Status: DC

## 2019-08-02 MED ORDER — OXYCODONE HCL 5 MG PO TABS: 5.0000 mg | ORAL_TABLET | Freq: Three times a day (TID) | ORAL | 0 refills | Status: DC | PRN

## 2019-08-02 MED FILL — GABAPENTIN 300 MG CAPSULE: 300 | 5 days supply | Qty: 10 | Fill #0

## 2019-08-02 MED FILL — oxyCODONE HCL 5 MG TABS: 5 | 5 days supply | Qty: 15 | Fill #0

## 2019-08-02 NOTE — Discharge Summary (Signed)
Physician Discharge Summary  Patient ID: Heather Lam MRN: [id number] DOB/AGE: 58-Oct-1962 58 y.o.  Admit date: 07/30/2019 Discharge date: 08/02/2019  Admission Diagnoses: colostomy  Discharge Diagnoses:  Active Problems:   History of colostomy reversal   Discharged Condition: good  Hospital Course: Admitted for postoperative care following laparoscopic assisted colostomy reversal. She progressed well and by post-op day 3 she was tolerating a soft diet without nausea or bloating, passing flatus and having bowel movements. Her pain was well controlled without IV narcotics. She had remained afebrile without tachycardia for the entirety of her hospitalization, her hemoglobin had remained stable, and her white blood cell count had continued to trend downwards. She did will with PT and OT and will be able to mobilize well on her own at home. She felt ready to go home and was medically cleared to do so.   Discharge Exam: Blood pressure (!) 141/78, pulse (!) 57, temperature 97.7 F (36.5 C), temperature source Oral, resp. rate 16, height 5\' 3"  (1.6 m), weight 96.6 kg, last menstrual period 12/31/2013, SpO2 94 %. General appearance: alert and cooperative Resp: unlabored Cardio: regular rate and rhythm GI: soft, nondistended, minimally appropriately tender around incisions. Port sites c/d/i with dermabond. Low midline with honeycomb in place, old blood on lower aspect no further drainage, no cellulitis. LUQ stoma site with serous drainage, no surrounding erythema. Skin: Skin color, texture, turgor normal. No rashes or lesions Neurologic: Grossly normal  Disposition: Discharge disposition: 01-Home or Self Care       Discharge Instructions    Increase activity slowly   Complete by: As directed      Allergies as of 08/02/2019   No Known Allergies     Medication List    TAKE these medications   acetaminophen 500 MG tablet Commonly known as: TYLENOL Take 500-1,000 mg by mouth  every 6 (six) hours as needed for moderate pain.   ALPRAZolam 0.25 MG tablet Commonly known as: XANAX Take 1 tablet (0.25 mg total) by mouth daily as needed for anxiety.   aspirin 81 MG EC tablet Take 1 tablet (81 mg total) by mouth daily. What changed:   medication strength  how much to take   Biotin 1 MG Caps Take 1 capsule by mouth daily.   busPIRone 15 MG tablet Commonly known as: BUSPAR Take 1 tablet (15 mg total) by mouth 3 (three) times daily.   Co Q10 100 MG Caps Take 1 capsule by mouth daily.   cyanocobalamin 1000 MCG/ML injection Commonly known as: (VITAMIN B-12) Inject 1 mL (1,000 mcg total) into the muscle every 30 (thirty) days.   cyclobenzaprine 10 MG tablet Commonly known as: FLEXERIL Take 1 tablet (10 mg total) by mouth 3 (three) times daily as needed for muscle spasms.   DULoxetine 30 MG capsule Commonly known as: CYMBALTA Take 2 capsules (60 mg total) by mouth daily.   gabapentin 300 MG capsule Commonly known as: NEURONTIN Take 1 capsule (300 mg total) by mouth 2 (two) times daily.   loratadine 10 MG tablet Commonly known as: CLARITIN Take 10 mg by mouth daily.   losartan-hydrochlorothiazide 100-25 MG tablet Commonly known as: HYZAAR Take 1 tablet by mouth daily.   metoprolol tartrate 25 MG tablet Commonly known as: LOPRESSOR TAKE 1 TABLET BY MOUTH 2 TIMES DAILY.   nitroGLYCERIN 0.4 MG SL tablet Commonly known as: Nitrostat 1 TABLET UNDER TONGUE EVERY 5 MINUTES UP TO 3 TIMES FOR CHEST PAIN THEN CALL DR IF NO RELIEF   oxyCODONE  5 MG immediate release tablet Commonly known as: Oxy IR/ROXICODONE Take 1 tablet (5 mg total) by mouth every 8 (eight) hours as needed for moderate pain (for pain not relieved by tylenol, ibuprofen or other medications).   risperiDONE 0.5 MG tablet Commonly known as: RISPERDAL TAKE 1 TABLET BY MOUTH AT BEDTIME   rosuvastatin 10 MG tablet Commonly known as: CRESTOR Take 1 tablet (10 mg total) by mouth daily.    traZODone 50 MG tablet Commonly known as: DESYREL TAKE 1-2 TABLETS BY MOUTH AT BEDTIME AS NEEDED FOR SLEEP.   Vascepa 1 g capsule Generic drug: icosapent Ethyl TAKE 2 CAPULES BY MOUTH TWICE DAILY What changed: See the new instructions.   vitamin C 100 MG tablet Take 100 mg by mouth daily.   Vitamin D (Ergocalciferol) 1.25 MG (50000 UT) Caps capsule Commonly known as: DRISDOL Take 1 capsule (50,000 Units total) by mouth every 7 (seven) days. What changed: when to take this   Zinc 50 MG Caps Take 50 mg by mouth daily.      Follow-up Information    Clovis Riley, MD In 3 weeks.   Specialty: General Surgery Contact information: [address] [place] [address] [phone]           Signed: Clovis Riley 08/02/2019, 9:02 AM

## 2019-08-02 NOTE — Discharge Instructions (Signed)
ABDOMINAL SURGERY: POST OP INSTRUCTIONS  1. DIET: Follow a light bland diet the first 24 hours after arrival home, such as soup, liquids, crackers, etc.  Be sure to include lots of fluids daily.  Avoid fast food or heavy meals as your are more likely to get nauseated.  Do not eat any uncooked fruits or vegetables for the next 2 weeks as your colon heals. 2. Take your usually prescribed home medications unless otherwise directed. 3. PAIN CONTROL: a. Pain is best controlled by a usual combination of three different methods TOGETHER: i. Ice/Heat ii. Over the counter pain medication iii. Prescription pain medication b. Most patients will experience some swelling and bruising around the incisions.  Ice packs or heating pads (30-60 minutes up to 6 times a day) will help. Use ice for the first few days to help decrease swelling and bruising, then switch to heat to help relax tight/sore spots and speed recovery.  Some people prefer to use ice alone, heat alone, alternating between ice & heat.  Experiment to what works for you.  Swelling and bruising can take several weeks to resolve.   c. It is helpful to take an over-the-counter pain medication regularly for the first few days: i. Naproxen (Aleve, etc)  Two 220mg  tabs twice a day OR Ibuprofen (Advil, etc) Three 200mg  tabs four times a day (every meal & bedtime) AND ii. Acetaminophen (Tylenol, etc) 500-650mg  four times a day (every meal & bedtime) d. A  prescription for pain medication (such as oxycodone, hydrocodone, etc) should be given to you upon discharge.  Take your pain medication as prescribed.  i. If you are having problems/concerns with the prescription medicine (does not control pain, nausea, vomiting, rash, itching, etc), please call us [phone] to see if we need to switch you to a different pain medicine that will work better for you and/or control your side effect better. ii. If you need a refill on your pain medication, please contact  your pharmacy.  They will contact our office to request authorization. Prescriptions will not be filled after 5 pm or on week-ends. 4. Avoid getting constipated.  Between the surgery and the pain medications, it is common to experience some constipation.  Increasing fluid intake and taking a fiber supplement (such as Metamucil, Citrucel, FiberCon, MiraLax, etc) 1-2 times a day regularly will usually help prevent this problem from occurring.  A mild laxative (prune juice, Milk of Magnesia, MiraLax, etc) should be taken according to package directions if there are no bowel movements after 48 hours.   5. Watch out for diarrhea.  If you have many loose bowel movements, simplify your diet to bland foods & liquids for a few days.  Stop any stool softeners and decrease your fiber supplement.  Switching to mild anti-diarrheal medications (Kayopectate, Pepto Bismol) can help.  If this worsens or does not improve, please call us. 6. Wash / shower every day.  You may shower over the incision / wound.  Avoid baths until the skin is fully healed.  Continue to shower over incision(s) after the dressing is off. 7. Change dry dressing to prior stoma site daily and as needed if saturated. 8. Remove your waterproof bandage (lower abdomen) 5 days after surgery (Saturday or Sunday).  You may leave the incision open to air.  You may replace a dressing/Band-Aid to cover the incision for comfort if you wish. 9. ACTIVITIES as tolerated:   a. You may resume regular (light) daily activities beginning the next day--such  as daily self-care, walking, climbing stairs--gradually increasing activities as tolerated.  If you can walk 30 minutes without difficulty, it is safe to try more intense activity such as jogging, treadmill, bicycling, low-impact aerobics, swimming, etc. b. Save the most intensive and strenuous activity for last such as sit-ups, heavy lifting, contact sports, etc  Refrain from any heavy lifting or straining until 6  weeks after surgery.   c. DO NOT PUSH THROUGH PAIN.  Let pain be your guide: If it hurts to do something, don't do it.  Pain is your body warning you to avoid that activity for another week until the pain goes down. d. You may drive when you are no longer taking prescription pain medication, you can comfortably wear a seatbelt, and you can safely maneuver your car and apply brakes. e. Dennis Bast may have sexual intercourse when it is comfortable.  10. FOLLOW UP in our office a. Please call [facility] at [phone] to set up an appointment to see your surgeon in the office for a follow-up appointment approximately 2-3 weeks after your surgery. b. Make sure that you call for this appointment the day you arrive home to insure a convenient appointment time. 10. IF YOU HAVE DISABILITY OR FAMILY LEAVE FORMS, BRING THEM TO THE OFFICE FOR PROCESSING.  DO NOT GIVE THEM TO YOUR DOCTOR.   WHEN TO CALL us [phone]: 1. Poor pain control 2. Reactions / problems with new medications (rash/itching, nausea, etc)  3. Fever over 101.5 F (38.5 C) 4. Inability to urinate 5. Nausea and/or vomiting 6. Worsening swelling or bruising 7. Continued bleeding from incision. 8. Increased pain, redness, or drainage from the incision  The clinic staff is available to answer your questions during regular business hours (8:30am-5pm).  Please don't hesitate to call and ask to speak to one of our nurses for clinical concerns.   A surgeon from [facility] is always on call at the hospitals   If you have a medical emergency, go to the nearest emergency room or call 911.    [facility], [address], [place], [place], [place]  [address] ? MAIN: [phone] ? TOLL FREE: [phone] ? FAX [phone] [email or web address]

## 2019-08-02 NOTE — Progress Notes (Signed)
OT Cancellation Note  Patient Details Name: Heather Lam MRN: [id number] DOB: October 18, 1960   Cancelled Treatment:     Checked in with pt for needs prior to d/c. She will discharge to mother's house. She will have help for adls as needed, as she does get some pulling when crossing legs to reach feet.  She has grab bars to help her get into tub. Will sign off.  [name] 08/02/2019, 8:40 AM  Karsten Ro, OTR/L Acute Rehabilitation Services 08/02/2019

## 2019-08-02 NOTE — Progress Notes (Signed)
Pt provided with d/c instructions. After discussing the pt's plan of care, the pt reported no further questions or concerns.

## 2019-08-04 ENCOUNTER — Other Ambulatory Visit: Payer: Self-pay | Admitting: Family Medicine

## 2019-08-04 DIAGNOSIS — B370 Candidal stomatitis: Secondary | ICD-10-CM

## 2019-08-04 MED ORDER — NYSTATIN 100000 UNIT/ML MT SUSP: 5.0000 mL | Freq: Four times a day (QID) | OROMUCOSAL | 0 refills | Status: AC

## 2019-08-04 NOTE — Progress Notes (Signed)
Heather Lam

## 2019-08-06 ENCOUNTER — Other Ambulatory Visit: Payer: Self-pay | Admitting: *Deleted

## 2019-08-06 ENCOUNTER — Encounter: Payer: Self-pay | Admitting: *Deleted

## 2019-08-06 NOTE — Patient Outreach (Signed)
Heather Lam [facility]) Care Management  08/06/2019  Heather Lam Jun 19, 1961 [id number]   Transition of care call/case closure   Referral received:07/26/19 Initial outreach:07/26/19 preop screening call, initial post discharge call 08/06/19 Insurance: Heather Lam: Initial successful telephone call to patient's preferred number in order to complete transition of care assessment; 2 HIPAA identifiers verified. Explained purpose of call and completed transition of care assessment.  Heather Lam states that she is doing pretty good, denies post-operative problems, says surgical incisions are unremarkable bandage removed on 08/04/19, site with staples intact without signs of redness no drainage. She expressed being thankful to no longer have colostomy. She  states surgical pain well managed with prescribed medications. She reports tolerating fluids and  bland diet eating small meals at a time, denies nausea. Heather Lam reports having regular bowel movements, denies constipation.  She is staying with her mother for the next week to assist in her recovery prior to return to her home and daughter will be available to assist . Heather Lam reports tolerating walking 2 to 3 times a day about 15 minutes at a time tolerating well.  She discussed  ongoing health issue of hypertension, explained Active health management chronic disease telephonic management program, she is agreeable to  a referral to one of the [facility] chronic disease management programs.  She does have Hospital indemnity plan, has contact number and will plan call on today.  She does use a  [facility] outpatient pharmacy, at [employer].  She denies educational needs related to staying safe during the COVID 19 pandemic.    Objective:  Heather Lam  was hospitalized at [facility] for laparoscopic assisted reversal on 07/30/19- 08/02/19 Comorbidities include: Hypertension, Myocardial infarction, OSA has CPAP,  hyperlipidemia, obesity, vitamin d deficiency, exploratory laparotomy lysis of adhesions and end colostomy.  She  was discharged to home on 08/02/19  without the need for home health services or DME.   Assessment:  Patient voices good understanding of all discharge instructions.  See transition of care flowsheet for assessment details.   Plan:  Reviewed hospital discharge diagnosis of Laparoscopic assisted reversal of colostomy  and discharge treatment plan using hospital discharge instructions, assessing medication adherence, reviewing problems requiring provider notification, and discussing the importance of follow up with surgeon, primary care provider and/or specialists as directed. Reviewed [place] healthy lifestyle program information to keep premiums low for 2022:  Step 1: Get annual physical between August 02, 2018 and January 31, 2020; Step 2: Complete your health assessment between August 03, 2019 and April 02, 2020 at [email or web address] Step 3:Identify your current health status and complete the corresponding action step between January 1, and April 02, 2020.   Using [place] website, able to enroll patient to   participate in [facility]'s Active Health Management chronic disease management program,noted patient case open as of 08/03/19, Health your way condition custom.   No ongoing care management needs identified so will close case to [place] Management services and route successful outreach letter with [place] Management pamphlet and 24 Hour Nurse Line Magnet to [place] Management clinical pool to be mailed to patient's home address. Thanked patient for their services to [facility].  Heather Draft, RN, [place] Management Coordinator  [phone]- Mobile [phone]- Toll Free Main Office

## 2019-08-14 ENCOUNTER — Ambulatory Visit: Payer: No Typology Code available for payment source

## 2019-08-14 ENCOUNTER — Ambulatory Visit (INDEPENDENT_AMBULATORY_CARE_PROVIDER_SITE_OTHER): Payer: No Typology Code available for payment source | Admitting: *Deleted

## 2019-08-14 DIAGNOSIS — D510 Vitamin B12 deficiency anemia due to intrinsic factor deficiency: Secondary | ICD-10-CM

## 2019-08-14 MED ORDER — CYANOCOBALAMIN 1000 MCG/ML IJ SOLN
1000.0000 ug | INTRAMUSCULAR | Status: DC
  Administered 2019-08-14: 1000 ug via INTRAMUSCULAR

## 2019-08-21 MED FILL — busPIRone HCL 15 MG TABS: 15 | 30 days supply | Qty: 90 | Fill #2

## 2019-08-21 MED FILL — LOSARTAN-HCTZ 100-25 MG TAB: 100-25 | 30 days supply | Qty: 30 | Fill #3

## 2019-09-09 MED FILL — METOPROLOL TARTRATE 25 MG T: 25 | 90 days supply | Qty: 180 | Fill #3

## 2019-09-19 ENCOUNTER — Other Ambulatory Visit: Payer: Self-pay | Admitting: *Deleted

## 2019-09-19 MED ORDER — NITROGLYCERIN 0.4 MG SL SUBL: SUBLINGUAL_TABLET | SUBLINGUAL | 3 refills | Status: DC

## 2019-09-19 NOTE — Telephone Encounter (Signed)
Patient requested refill on nitroglycerin. Rx sent.

## 2019-09-24 ENCOUNTER — Other Ambulatory Visit: Payer: Self-pay | Admitting: *Deleted

## 2019-09-24 MED ORDER — LOSARTAN POTASSIUM-HCTZ 100-25 MG PO TABS: 1.0000 | ORAL_TABLET | Freq: Every day | ORAL | 2 refills | Status: DC

## 2019-09-24 MED FILL — LOSARTAN-HCTZ 100-25 MG TAB: 100-25 | 30 days supply | Qty: 30 | Fill #0

## 2019-10-07 MED FILL — DULoxetine HCL 30 MG CPEP: 30 | 90 days supply | Qty: 180 | Fill #1

## 2019-10-08 ENCOUNTER — Ambulatory Visit: Payer: No Typology Code available for payment source | Admitting: Interventional Cardiology

## 2019-10-09 MED FILL — traZODone HCL 50 MG TABS: 50 | 45 days supply | Qty: 90 | Fill #1

## 2019-10-14 MED FILL — busPIRone HCL 15 MG TABS: 15 | 30 days supply | Qty: 90 | Fill #3

## 2019-10-15 MED FILL — VASCEPA 1 GM CAPSULE: 1 | 90 days supply | Qty: 360 | Fill #1

## 2019-10-28 MED FILL — LOSARTAN-HCTZ 100-25 MG TAB: 100-25 | 30 days supply | Qty: 30 | Fill #1

## 2019-10-29 ENCOUNTER — Other Ambulatory Visit: Payer: Self-pay | Admitting: *Deleted

## 2019-10-29 MED ORDER — RISPERIDONE 0.5 MG PO TABS: 0.5000 mg | ORAL_TABLET | Freq: Every day | ORAL | 0 refills | Status: DC

## 2019-10-29 MED FILL — risperiDONE 0.5 MG TABS: 0.5 | 90 days supply | Qty: 90 | Fill #0

## 2019-11-01 ENCOUNTER — Other Ambulatory Visit: Payer: Self-pay

## 2019-11-01 ENCOUNTER — Encounter: Payer: Self-pay | Admitting: Family Medicine

## 2019-11-01 ENCOUNTER — Ambulatory Visit (INDEPENDENT_AMBULATORY_CARE_PROVIDER_SITE_OTHER): Payer: No Typology Code available for payment source | Admitting: Family Medicine

## 2019-11-01 VITALS — BP 135/71 | HR 67 | Temp 98.6°F | Ht 63.0 in | Wt 192.0 lb

## 2019-11-01 DIAGNOSIS — E559 Vitamin D deficiency, unspecified: Secondary | ICD-10-CM | POA: Diagnosis not present

## 2019-11-01 DIAGNOSIS — R7303 Prediabetes: Secondary | ICD-10-CM

## 2019-11-01 DIAGNOSIS — Z720 Tobacco use: Secondary | ICD-10-CM

## 2019-11-01 DIAGNOSIS — D229 Melanocytic nevi, unspecified: Secondary | ICD-10-CM

## 2019-11-01 DIAGNOSIS — M549 Dorsalgia, unspecified: Secondary | ICD-10-CM

## 2019-11-01 DIAGNOSIS — I2510 Atherosclerotic heart disease of native coronary artery without angina pectoris: Secondary | ICD-10-CM

## 2019-11-01 DIAGNOSIS — D649 Anemia, unspecified: Secondary | ICD-10-CM

## 2019-11-01 DIAGNOSIS — Z9861 Coronary angioplasty status: Secondary | ICD-10-CM

## 2019-11-01 DIAGNOSIS — I10 Essential (primary) hypertension: Secondary | ICD-10-CM

## 2019-11-01 DIAGNOSIS — Z9889 Other specified postprocedural states: Secondary | ICD-10-CM

## 2019-11-01 DIAGNOSIS — F411 Generalized anxiety disorder: Secondary | ICD-10-CM

## 2019-11-01 DIAGNOSIS — Z0001 Encounter for general adult medical examination with abnormal findings: Secondary | ICD-10-CM | POA: Diagnosis not present

## 2019-11-01 DIAGNOSIS — F3341 Major depressive disorder, recurrent, in partial remission: Secondary | ICD-10-CM

## 2019-11-01 DIAGNOSIS — Z0000 Encounter for general adult medical examination without abnormal findings: Secondary | ICD-10-CM

## 2019-11-01 MED ORDER — PREDNISONE 10 MG (21) PO TBPK: ORAL_TABLET | ORAL | 0 refills | Status: DC

## 2019-11-01 MED ORDER — RISPERIDONE 0.25 MG PO TABS: 0.2500 mg | ORAL_TABLET | Freq: Every day | ORAL | 0 refills | Status: DC

## 2019-11-01 MED FILL — risperiDONE 0.25 MG TABS: 0.25 | 30 days supply | Qty: 30 | Fill #0

## 2019-11-01 NOTE — Patient Instructions (Signed)
Cryosurgery for Skin Conditions, Care After This sheet gives you information about how to care for yourself after your procedure. Your health care provider may also give you more specific instructions. If you have problems or questions, contact your health care provider. What can I expect after the procedure? After your procedure, it is common to have redness, swelling, and a blister that forms over the treated area. The blister may contain a small amount of blood. After about 2 weeks, the blister will break on its own, leaving a scab. Then the treated area will heal. After healing, there is usually little or no scarring. Follow these instructions at home: Caring for the treated area   Follow instructions from your health care provider about how to take care of the treated area. Make sure you: ? Keep the area covered with a bandage (dressing) until it heals, or for as long as told by your health care provider. ? Wash your hands with soap and water before you change your dressing. If soap and water are not available, use hand sanitizer. ? Change your dressing as told by your health care provider. ? Keep the dressing and the treated area clean and dry. If the dressing gets wet, change it right away. ? Clean the treated area with soap and water.  Check the treated area every day for signs of infection. Check for: ? More redness, swelling, or pain. ? More fluid or blood. ? Warmth. ? Pus or a bad smell. General instructions  Do not pick at your blister or try to break it open. This can cause infection and scarring.  Do not apply any medicine, cream, or lotion to the treated area unless directed by your health care provider.  Take over-the-counter and prescription medicines only as told by your health care provider.  Keep all follow-up visits as told by your health care provider. This is important. Contact a health care provider if:  You have more redness, swelling, or pain around the  treated area.  You have more fluid or blood coming from the treated area.  The treated area feels warm to the touch.  You have pus or a bad smell coming from the treated area.  Your blister becomes large and painful. Get help right away if:  You have a fever and have redness spreading from the treated area. Summary  The treated area will become red and swollen shortly after the procedure.  You should keep the treated area and your dressing clean and dry.  Check the treated area every day for signs of infection, such as fluid, pus, warmth, or having more redness, swelling, or pain.  Do not pick at your blister or try to break it open. This information is not intended to replace advice given to you by your health care provider. Make sure you discuss any questions you have with your health care provider. Document Revised: 07/01/2017 Document Reviewed: 06/07/2016 Elsevier Patient Education  2020 [place] Maintenance, Female Adopting a healthy lifestyle and getting preventive care are important in promoting health and wellness. Ask your health care provider about:  The right schedule for you to have regular tests and exams.  Things you can do on your own to prevent diseases and keep yourself healthy. What should I know about diet, weight, and exercise? Eat a healthy diet   Eat a diet that includes plenty of vegetables, fruits, low-fat dairy products, and lean protein.  Do not eat a lot of foods that are high  in solid fats, added sugars, or sodium. Maintain a healthy weight Body mass index (BMI) is used to identify weight problems. It estimates body fat based on height and weight. Your health care provider can help determine your BMI and help you achieve or maintain a healthy weight. Get regular exercise Get regular exercise. This is one of the most important things you can do for your health. Most adults should:  Exercise for at least 150 minutes each week. The  exercise should increase your heart rate and make you sweat (moderate-intensity exercise).  Do strengthening exercises at least twice a week. This is in addition to the moderate-intensity exercise.  Spend less time sitting. Even light physical activity can be beneficial. Watch cholesterol and blood lipids Have your blood tested for lipids and cholesterol at 59 years of age, then have this test every 5 years. Have your cholesterol levels checked more often if:  Your lipid or cholesterol levels are high.  You are older than 59 years of age.  You are at high risk for heart disease. What should I know about cancer screening? Depending on your health history and family history, you may need to have cancer screening at various ages. This may include screening for:  Breast cancer.  Cervical cancer.  Colorectal cancer.  Skin cancer.  Lung cancer. What should I know about heart disease, diabetes, and high blood pressure? Blood pressure and heart disease  High blood pressure causes heart disease and increases the risk of stroke. This is more likely to develop in people who have high blood pressure readings, are of African descent, or are overweight.  Have your blood pressure checked: ? Every 3-5 years if you are 48-39 years of age. ? Every year if you are 63 years old or older. Diabetes Have regular diabetes screenings. This checks your fasting blood sugar level. Have the screening done:  Once every three years after age 52 if you are at a normal weight and have a low risk for diabetes.  More often and at a younger age if you are overweight or have a high risk for diabetes. What should I know about preventing infection? Hepatitis B If you have a higher risk for hepatitis B, you should be screened for this virus. Talk with your health care provider to find out if you are at risk for hepatitis B infection. Hepatitis C Testing is recommended for:  Everyone born from [birth year] through  1965.  Anyone with known risk factors for hepatitis C. Sexually transmitted infections (STIs)  Get screened for STIs, including gonorrhea and chlamydia, if: ? You are sexually active and are younger than 59 years of age. ? You are older than 59 years of age and your health care provider tells you that you are at risk for this type of infection. ? Your sexual activity has changed since you were last screened, and you are at increased risk for chlamydia or gonorrhea. Ask your health care provider if you are at risk.  Ask your health care provider about whether you are at high risk for HIV. Your health care provider may recommend a prescription medicine to help prevent HIV infection. If you choose to take medicine to prevent HIV, you should first get tested for HIV. You should then be tested every 3 months for as long as you are taking the medicine. Pregnancy  If you are about to stop having your period (premenopausal) and you may become pregnant, seek counseling before you get pregnant.  Take  400 to 800 micrograms (mcg) of folic acid every day if you become pregnant.  Ask for birth control (contraception) if you want to prevent pregnancy. Osteoporosis and menopause Osteoporosis is a disease in which the bones lose minerals and strength with aging. This can result in bone fractures. If you are 39 years old or older, or if you are at risk for osteoporosis and fractures, ask your health care provider if you should:  Be screened for bone loss.  Take a calcium or vitamin D supplement to lower your risk of fractures.  Be given hormone replacement therapy (HRT) to treat symptoms of menopause. Follow these instructions at home: Lifestyle  Do not use any products that contain nicotine or tobacco, such as cigarettes, e-cigarettes, and chewing tobacco. If you need help quitting, ask your health care provider.  Do not use street drugs.  Do not share needles.  Ask your health care provider for  help if you need support or information about quitting drugs. Alcohol use  Do not drink alcohol if: ? Your health care provider tells you not to drink. ? You are pregnant, may be pregnant, or are planning to become pregnant.  If you drink alcohol: ? Limit how much you use to 0-1 drink a day. ? Limit intake if you are breastfeeding.  Be aware of how much alcohol is in your drink. In the [place] one drink equals one 12 oz bottle of beer (355 mL), one 5 oz glass of wine (148 mL), or one 1 oz glass of hard liquor (44 mL). General instructions  Schedule regular health, dental, and eye exams.  Stay current with your vaccines.  Tell your health care provider if: ? You often feel depressed. ? You have ever been abused or do not feel safe at home. Summary  Adopting a healthy lifestyle and getting preventive care are important in promoting health and wellness.  Follow your health care provider's instructions about healthy diet, exercising, and getting tested or screened for diseases.  Follow your health care provider's instructions on monitoring your cholesterol and blood pressure. This information is not intended to replace advice given to you by your health care provider. Make sure you discuss any questions you have with your health care provider. Document Revised: 07/12/2018 Document Reviewed: 07/12/2018 Elsevier Patient Education  2020 [employer].

## 2019-11-01 NOTE — Progress Notes (Signed)
Heather Lam is a 59 y.o. female presents to office today for annual physical exam examination.    Concerns today include: 1.  Low back pain Patient reports chronic low back pain but symptoms seem to be radiating down the right buttock/lower extremity where she is having some sensory changes in the right foot.  She has history of back surgery previously at the L5 on S1 level.  Denies any weakness, falls, preceding injury.  She is taking OTC analgesics with no improvement.  2.  Anxiety/depression Patient is treated with Cymbalta 60 mg daily, BuSpar 15 mg twice daily and Risperdal 0.5 mg daily.  The Risperdal was started about a year ago, after patient separated from her spouse.  She feels that she has been doing well and would like to go ahead and start tapering off of both Risperdal and buspirone.  Occupation: Marine scientist, Marital status: single, Substance use: tobacco use (recently started smoking again) Diet: fair, Exercise: no structured Last eye exam: UTD Last dental exam: UTD Last colonoscopy: UTD Last mammogram: UTD (had w Dr Helane Rima last year) Last pap smear: UTD (had with Dr Helane Rima last year) Refills needed today: none Immunizations needed: UTD  Past Medical History:  Diagnosis Date  . Anxiety   . Back pain   . CAD (coronary artery disease)   . Chest pain   . Complication of anesthesia   . Constipation   . Diverticulitis   . Diverticulosis   . Heart attack ([place]) 2010  . History of carpal tunnel syndrome    Bilateral  . History of kidney stones   . Hyperlipidemia   . Hypertension   . Joint pain   . Migraines   . PONV (postoperative nausea and vomiting)   . Skin cancer    on nose  . Sleep apnea    wears CPAP nightly  . Vitamin D deficiency    Social History   Socioeconomic History  . Marital status: Single    Spouse name: Not on file  . Number of children: Not on file  . Years of education: Not on file  . Highest education level: Not on file  Occupational  History  . 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 Smoking status: Former Smoker    Packs/day: 1.00    Years: 20.00    Pack years: 20.00    Types: Cigarettes    Quit date: 2010    Years since quitting: 11.2  . Smokeless tobacco: Never Used  Substance and Sexual Activity  . Alcohol use: Not Currently    Comment: Rare   . Drug use: No  . Sexual activity: Yes    Birth control/protection: Post-menopausal  Other Topics Concern  . Not on file  Social History Narrative   Caffeine daily    Social Determinants of Health   Financial Resource Strain:   . Difficulty of Paying Living Expenses:   Food Insecurity:   . Worried About Charity fundraiser in the Last Year:   . Arboriculturist in the Last Year:   Transportation Needs:   . Film/video editor (Medical):   [name] Lack of Transportation (Non-Medical):   Physical Activity:   . Days of Exercise per Week:   . Minutes of Exercise per Session:   Stress:   . Feeling of Stress :   Social Connections:   . Frequency of Communication with Friends  and Family:   . Frequency of Social Gatherings with Friends and Family:   . Attends Religious Services:   . Active Member of Clubs or Organizations:   . Attends Archivist Meetings:   [name] Marital Status:   Intimate Partner Violence:   . Fear of Current or Ex-Partner:   . Emotionally Abused:   [name] Physically Abused:   . Sexually Abused:    Past Surgical History:  Procedure Laterality Date  . APPENDECTOMY    . APPLICATION OF WOUND VAC N/A 02/09/2019   Procedure: APPLICATION OF WOUND VAC;  Surgeon: Clovis Riley, MD;  Location: [facility];  Service: General;  Laterality: N/A;  . BACK SURGERY    . CARPAL TUNNEL RELEASE Right   . CARPAL TUNNEL RELEASE Left 08/13/2016   Procedure: LEFT CARPAL TUNNEL RELEASE;  Surgeon: Roseanne Kaufman, MD;  Location: [place] [facility];  Service: Orthopedics;  Laterality:  Left;  . COLECTOMY WITH COLOSTOMY CREATION/HARTMANN PROCEDURE N/A 02/09/2019   Procedure: HARTMANN PROCEDURE;  Surgeon: Clovis Riley, MD;  Location: [place];  Service: General;  Laterality: N/A;  . COLONOSCOPY    . COLOSTOMY N/A 02/09/2019   Procedure: COLOSTOMY;  Surgeon: Clovis Riley, MD;  Location: [place];  Service: General;  Laterality: N/A;  . COLOSTOMY REVERSAL N/A 07/30/2019   Procedure: LAPAROSCOPIC ASSISTED REVERSAL OF END COLOSTOMY WITH RIGID PROCTOSCOPY;  Surgeon: Clovis Riley, MD;  Location: [facility];  Service: General;  Laterality: N/A;  . CORONARY STENT PLACEMENT     2 stents in the same artery  . ENDOMETRIAL ABLATION  2005  . EXTRACORPOREAL SHOCK WAVE LITHOTRIPSY Right 06/27/2017   Procedure: RIGHT EXTRACORPOREAL SHOCK WAVE LITHOTRIPSY (ESWL);  Surgeon: Franchot Gallo, MD;  Location: [facility];  Service: Urology;  Laterality: Right;  . KNEE ARTHROSCOPY WITH MENISCAL REPAIR Right   . MOHS SURGERY    . TUBAL LIGATION  1991   Family History  Problem Relation Age of Onset  . Hyperlipidemia Mother   . Hypertension Mother   . Cancer Father        skin  . Hyperlipidemia Father   . Dementia Father   . Arthritis Father   . Hypertension Father   . Sleep apnea Father   . Other Sister        Myelofibrosis  . Arthritis Sister   . Hyperlipidemia Sister   . Arthritis Sister   . Hyperlipidemia Sister   . Diabetes Paternal Grandfather   . Colon cancer Neg Hx     Current Outpatient Medications:  .  aspirin EC 81 MG EC tablet, Take 1 tablet (81 mg total) by mouth daily., Disp: , Rfl:  .  busPIRone (BUSPAR) 15 MG tablet, Take 1 tablet (15 mg total) by mouth 3 (three) times daily., Disp: 90 tablet, Rfl: 5 .  Coenzyme Q10 (CO Q10) 100 MG CAPS, Take 1 capsule by mouth daily., Disp: , Rfl:  .  cyanocobalamin (,VITAMIN B-12,) 1000 MCG/ML injection, Inject 1 mL (1,000 mcg total) into the muscle every 30 (thirty) days., Disp: 10 mL, Rfl: 0 .  DULoxetine (CYMBALTA) 30 MG  capsule, Take 2 capsules (60 mg total) by mouth daily., Disp: 180 capsule, Rfl: 0 .  losartan-hydrochlorothiazide (HYZAAR) 100-25 MG tablet, Take 1 tablet by mouth daily., Disp: 90 tablet, Rfl: 2 .  metoprolol tartrate (LOPRESSOR) 25 MG tablet, TAKE 1 TABLET BY MOUTH 2 TIMES DAILY. (Patient taking differently: Take 25 mg by mouth 2 (two) times daily. ), Disp: 180 tablet,  Rfl: 3 .  nitroGLYCERIN (NITROSTAT) 0.4 MG SL tablet, 1 TABLET UNDER TONGUE EVERY 5 MINUTES UP TO 3 TIMES FOR CHEST PAIN THEN CALL DR IF NO RELIEF, Disp: 25 tablet, Rfl: 3 .  risperiDONE (RISPERDAL) 0.5 MG tablet, Take 1 tablet (0.5 mg total) by mouth at bedtime., Disp: 90 tablet, Rfl: 0 .  rosuvastatin (CRESTOR) 10 MG tablet, Take 1 tablet (10 mg total) by mouth daily., Disp: 90 tablet, Rfl: 1 .  traZODone (DESYREL) 50 MG tablet, TAKE 1-2 TABLETS BY MOUTH AT BEDTIME AS NEEDED FOR SLEEP., Disp: 90 tablet, Rfl: 3 .  VASCEPA 1 g CAPS, TAKE 2 CAPULES BY MOUTH TWICE DAILY (Patient taking differently: Take 2 g by mouth 2 (two) times daily. ), Disp: 360 capsule, Rfl: 2 .  Vitamin D, Ergocalciferol, (DRISDOL) 1.25 MG (50000 UT) CAPS capsule, Take 1 capsule (50,000 Units total) by mouth every 7 (seven) days. (Patient taking differently: Take 50,000 Units by mouth every Sunday. ), Disp: 12 capsule, Rfl: 3 .  acetaminophen (TYLENOL) 500 MG tablet, Take 500-1,000 mg by mouth every 6 (six) hours as needed for moderate pain. , Disp: , Rfl:  .  ALPRAZolam (XANAX) 0.25 MG tablet, Take 1 tablet (0.25 mg total) by mouth daily as needed for anxiety. (Patient not taking: Reported on 11/01/2019), Disp: 30 tablet, Rfl: 0 .  Ascorbic Acid (VITAMIN C) 100 MG tablet, Take 100 mg by mouth daily., Disp: , Rfl:  .  Biotin 1 MG CAPS, Take 1 capsule by mouth daily. , Disp: , Rfl:  .  cyclobenzaprine (FLEXERIL) 10 MG tablet, Take 1 tablet (10 mg total) by mouth 3 (three) times daily as needed for muscle spasms., Disp: 60 tablet, Rfl: 0 .  gabapentin (NEURONTIN) 300  MG capsule, Take 1 capsule (300 mg total) by mouth 2 (two) times daily., Disp: 10 capsule, Rfl: 1 .  oxyCODONE (OXY IR/ROXICODONE) 5 MG immediate release tablet, Take 1 tablet (5 mg total) by mouth every 8 (eight) hours as needed for moderate pain (for pain not relieved by tylenol, ibuprofen or other medications)., Disp: 15 tablet, Rfl: 0 .  Zinc 50 MG CAPS, Take 50 mg by mouth daily. , Disp: , Rfl:   Current Facility-Administered Medications:  .  cyanocobalamin ((VITAMIN B-12)) injection 1,000 mcg, 1,000 mcg, Intramuscular, Q30 days, Jones, Angel S, PA-C, 1,000 mcg at 08/14/19 0854  No Known Allergies   ROS: Review of Systems Pertinent items noted in HPI and remainder of comprehensive ROS otherwise negative.    Physical exam BP 135/71   Pulse 67   Temp 98.6 F (37 C) (Temporal)   Ht '5\' 3"'$  (1.6 m)   Wt 192 lb (87.1 kg)   LMP 12/31/2013   SpO2 96%   BMI 34.01 kg/m  General appearance: alert, cooperative, appears stated age and no distress Head: Normocephalic, without obvious abnormality, atraumatic Eyes: negative findings: lids and lashes normal, conjunctivae and sclerae normal, corneas clear and pupils equal, round, reactive to light and accomodation Ears: normal TM's and external ear canals both ears Nose: Nares normal. Septum midline. Mucosa normal. No drainage or sinus tenderness. Throat: lips, mucosa, and tongue normal; teeth and gums normal Neck: no adenopathy, no carotid bruit, supple, symmetrical, trachea midline and thyroid not enlarged, symmetric, no tenderness/mass/nodules Back: symmetric, no curvature. ROM normal. No CVA tenderness.;  Mild reproduction of pain with right sided straight leg raise. Lungs: clear to auscultation bilaterally Heart: regular rate and rhythm, S1, S2 normal, no murmur, click, rub or gallop Abdomen:  soft, non-tender; bowel sounds normal; no masses,  no organomegaly Extremities: extremities normal, atraumatic, no cyanosis or edema Pulses: 2+ and  symmetric Skin: Seborrheic keratosis and along the left anterior chest Lymph nodes: Cervical, supraclavicular, and axillary nodes normal. Neurologic: Alert and oriented X 3, normal strength and tone. Normal symmetric reflexes. Normal coordination and gait Psych: Mood stable, speech normal, affect appropriate, pleasant and interactive Depression screen [facility] 2/9 11/01/2019 07/13/2019 04/27/2019  Decreased Interest 0 0 0  Down, Depressed, Hopeless 0 0 0  PHQ - 2 Score 0 0 0  Altered sleeping 0 - -  Tired, decreased energy 0 - -  Change in appetite 0 - -  Feeling bad or failure about yourself  0 - -  Trouble concentrating 0 - -  Moving slowly or fidgety/restless 0 - -  Suicidal thoughts 0 - -  PHQ-9 Score 0 - -  Difficult doing work/chores - - -  Some recent data might be hidden   GAD 7 : Generalized Anxiety Score 11/01/2019 02/21/2019  Nervous, Anxious, on Edge 0 3  Control/stop worrying 0 1  Worry too much - different things 0 0  Trouble relaxing 0 0  Restless 0 0  Easily annoyed or irritable 0 0  Afraid - awful might happen 0 0  Total GAD 7 Score 0 4   Assessment/ Plan: Heather Lam here for annual physical exam.   1. Annual physical exam ROI completed for mammogram and Pap smear from Dr. Tressia Danas  2. Tobacco use Counseling.  Patient is contemplative  3. Back pain with history of spinal surgery I have placed her on a prednisone Dosepak in efforts to improve her pain.  If ongoing symptoms, we will plan to have her evaluated again by her surgeon.  Encouraged home physical therapy exercises  4. Vitamin D deficiency - VITAMIN D 25 Hydroxy (Vit-D Deficiency, Fractures)  5. Prediabetes - Bayer DCA Hb A1c Waived  6. CAD S/P percutaneous coronary angioplasty - CMP14+EGFR - Lipid Panel - TSH  7. Essential hypertension - CMP14+EGFR  8. Anemia, unspecified type - CBC - Vitamin B12  9. Multiple pigmented nevi - Ambulatory referral to Dermatology  10. Generalized anxiety  disorder Discussed Risperdal taper.  Plan for 0.25 mg nightly for at least 3 weeks then may go to every other day for 2 to 3 weeks then stop.  We discussed that if she becomes symptomatic with the every other day taper, she can continue just nightly for another few weeks before going to every other day.  11. Recurrent major depressive disorder, in partial remission ([place])   Meds ordered this encounter  Medications  . DISCONTD: predniSONE (STERAPRED UNI-PAK 21 TAB) 10 MG (21) TBPK tablet    Sig: As directed x 6 days    Dispense:  21 tablet    Refill:  0  . predniSONE (STERAPRED UNI-PAK 21 TAB) 10 MG (21) TBPK tablet    Sig: As directed x 6 days    Dispense:  21 tablet    Refill:  0  . risperiDONE (RISPERDAL) 0.25 MG tablet    Sig: Take 1 tablet (0.25 mg total) by mouth at bedtime.    Dispense:  30 tablet    Refill:  0    Counseled on healthy lifestyle choices, including diet (rich in fruits, vegetables and lean meats and low in salt and simple carbohydrates) and exercise (at least 30 minutes of moderate physical activity daily).  Patient to follow up in 1 year for annual  exam or sooner if needed.  Norton Bivins M. Lajuana Ripple, DO

## 2019-11-02 LAB — CMP14+EGFR
ALT: 12 IU/L (ref 0–32)
AST: 13 IU/L (ref 0–40)
Albumin/Globulin Ratio: 1.6 (ref 1.2–2.2)
Albumin: 4.2 g/dL (ref 3.8–4.9)
Alkaline Phosphatase: 81 IU/L (ref 39–117)
BUN/Creatinine Ratio: 22 (ref 9–23)
BUN: 15 mg/dL (ref 6–24)
Bilirubin Total: <0.2 mg/dL (ref 0.0–1.2)
CO2: 29 mmol/L (ref 20–29)
Calcium: 9.8 mg/dL (ref 8.7–10.2)
Chloride: 99 mmol/L (ref 96–106)
Creatinine, Ser: 0.69 mg/dL (ref 0.57–1.00)
GFR calc Af Amer: 111 mL/min/{1.73_m2} (ref >59)
GFR calc non Af Amer: 96 mL/min/{1.73_m2} (ref >59)
Globulin, Total: 2.6 g/dL (ref 1.5–4.5)
Glucose: 97 mg/dL (ref 65–99)
Potassium: 4.1 mmol/L (ref 3.5–5.2)
Sodium: 141 mmol/L (ref 134–144)
Total Protein: 6.8 g/dL (ref 6.0–8.5)

## 2019-11-02 LAB — CBC
Hematocrit: 45.7 % (ref 34.0–46.6)
Hemoglobin: 15.4 g/dL (ref 11.1–15.9)
MCH: 28.8 pg (ref 26.6–33.0)
MCHC: 33.7 g/dL (ref 31.5–35.7)
MCV: 86 fL (ref 79–97)
Platelets: 231 10*3/uL (ref 150–450)
RBC: 5.34 x10E6/uL — ABNORMAL HIGH (ref 3.77–5.28)
RDW: 13.6 % (ref 11.7–15.4)
WBC: 10.3 10*3/uL (ref 3.4–10.8)

## 2019-11-02 LAB — LIPID PANEL
Chol/HDL Ratio: 4 ratio (ref 0.0–4.4)
Cholesterol, Total: 152 mg/dL (ref 100–199)
HDL: 38 mg/dL — ABNORMAL LOW (ref >39)
LDL Chol Calc (NIH): 85 mg/dL (ref 0–99)
Triglycerides: 169 mg/dL — ABNORMAL HIGH (ref 0–149)
VLDL Cholesterol Cal: 29 mg/dL (ref 5–40)

## 2019-11-02 LAB — TSH: TSH: 2.31 u[IU]/mL (ref 0.450–4.500)

## 2019-11-02 LAB — VITAMIN D 25 HYDROXY (VIT D DEFICIENCY, FRACTURES): Vit D, 25-Hydroxy: 55.1 ng/mL (ref 30.0–100.0)

## 2019-11-02 LAB — VITAMIN B12: Vitamin B-12: 1687 pg/mL — ABNORMAL HIGH (ref 232–1245)

## 2019-11-06 ENCOUNTER — Other Ambulatory Visit: Payer: Self-pay | Admitting: Family Medicine

## 2019-11-06 DIAGNOSIS — E8881 Metabolic syndrome: Secondary | ICD-10-CM

## 2019-11-06 MED ORDER — BD PEN NEEDLE MICRO U/F 32G X 6 MM MISC: 3 refills | Status: DC

## 2019-11-06 MED ORDER — SAXENDA 18 MG/3ML ~~LOC~~ SOPN: PEN_INJECTOR | SUBCUTANEOUS | 0 refills | Status: AC

## 2019-11-06 MED FILL — UNIFINE PENTIPS 6MM 31G: 31G X 6 MM | 90 days supply | Qty: 100 | Fill #0

## 2019-11-06 NOTE — Addendum Note (Signed)
Addended by: [name] on: 11/06/2019 02:23 PM   Modules accepted: Orders

## 2019-11-08 ENCOUNTER — Telehealth: Payer: Self-pay | Admitting: *Deleted

## 2019-11-08 NOTE — Telephone Encounter (Signed)
Prior Auth for Saxenda 18MG /3ML pen-injectors  Key: [id number]  [employer] is reviewing your PA request. You may close this dialog, return to your dashboard, and perform other tasks.  To check for an update later, open this request again from your dashboard. If [employer] has not replied within 24 hours for urgent requests or within 48 hours for standard requests, please contact [employer] at [phone].

## 2019-11-09 NOTE — Telephone Encounter (Signed)
Hi Heather Lam,   I think this may be a PA you can help [place] with. [name] is patient and employee here at [facility]. I will get you the fax that came over on this med. -Thanks, Reginia Forts

## 2019-11-12 ENCOUNTER — Encounter: Payer: Self-pay | Admitting: Pharmacist

## 2019-11-13 MED FILL — SAXENDA 18 MG/3 ML PEN: 18 | 89 days supply | Qty: 15 | Fill #0

## 2019-11-13 NOTE — Telephone Encounter (Signed)
Pt was approved for Saxenda for 4 fills: Pt will have to have a 4% loss of baseline body weight after the 4 months/ 4 fills to continue renewal.  Pt aware / pharmacy aware

## 2019-11-15 NOTE — Progress Notes (Signed)
Cardiology Office Note:    Date:  11/16/2019   ID:  Heather Lam, DOB [birth year], MRN [id number]  PCP:  [name], DO  Cardiologist:  No primary care provider on file.   Referring MD: Terald Sleeper, PA-C   Chief Complaint  Patient presents with  . Coronary Artery Disease  . Chest Pain    History of Present Illness:    Heather Lam is a 59 y.o. female with a hx of coronary artery disease with prior acute inferior infarction 2010stent to RCAand intermediate stenosis in the mid LADtreated medically.2012 nuclear study negative for ischemia. Other problems include hyperlipidemia, hypertension, and anxiety disorder. nuc study in 2016 with EF 74% normal study. recentlipids LDL 51. TG at [place] is doing well.  She developed diverticulitis with intra-abdominal abscesses in mid 2000.  She was in the hospital for 6 weeks.  She had a colostomy.  Later in 2020 the colostomy was taken down.  Despite the severity and chronicity of that illness there were no cardiac abnormalities whatsoever.  She is now back at work.  Physical activity does not provoke angina or shortness of breath.  She denies palpitations.  No episodes of syncope have occurred.  Past Medical History:  Diagnosis Date  . Anxiety   . Back pain   . CAD (coronary artery disease)   . Chest pain   . Complication of anesthesia   . Constipation   . Diverticulitis   . Diverticulosis   . Heart attack ([name]) 2010  . History of carpal tunnel syndrome    Bilateral  . History of kidney stones   . Hyperlipidemia   . Hypertension   . Joint pain   . Migraines   . PONV (postoperative nausea and vomiting)   . Skin cancer    on nose  . Sleep apnea    wears CPAP nightly  . Vitamin D deficiency     Past Surgical History:  Procedure Laterality Date  . APPENDECTOMY    . APPLICATION OF WOUND VAC N/A 02/09/2019   Procedure: APPLICATION OF WOUND VAC;  Surgeon: Clovis Riley, MD;  Location: [facility];   Service: General;  Laterality: N/A;  . BACK SURGERY    . CARPAL TUNNEL RELEASE Right   . CARPAL TUNNEL RELEASE Left 08/13/2016   Procedure: LEFT CARPAL TUNNEL RELEASE;  Surgeon: Roseanne Kaufman, MD;  Location: [facility];  Service: Orthopedics;  Laterality: Left;  . COLECTOMY WITH COLOSTOMY CREATION/HARTMANN PROCEDURE N/A 02/09/2019   Procedure: HARTMANN PROCEDURE;  Surgeon: Clovis Riley, MD;  Location: [facility];  Service: General;  Laterality: N/A;  . COLONOSCOPY    . COLOSTOMY N/A 02/09/2019   Procedure: COLOSTOMY;  Surgeon: Clovis Riley, MD;  Location: [facility];  Service: General;  Laterality: N/A;  . COLOSTOMY REVERSAL N/A 07/30/2019   Procedure: LAPAROSCOPIC ASSISTED REVERSAL OF END COLOSTOMY WITH RIGID PROCTOSCOPY;  Surgeon: Clovis Riley, MD;  Location: [facility];  Service: General;  Laterality: N/A;  . CORONARY STENT PLACEMENT     2 stents in the same artery  . ENDOMETRIAL ABLATION  2005  . EXTRACORPOREAL SHOCK WAVE LITHOTRIPSY Right 06/27/2017   Procedure: RIGHT EXTRACORPOREAL SHOCK WAVE LITHOTRIPSY (ESWL);  Surgeon: Franchot Gallo, MD;  Location: [facility];  Service: Urology;  Laterality: Right;  . KNEE ARTHROSCOPY WITH MENISCAL REPAIR Right   . MOHS SURGERY    . TUBAL LIGATION  1991    Current Medications: Current Meds  Medication Sig  .  acetaminophen (TYLENOL) 500 MG tablet Take 500-1,000 mg by mouth every 6 (six) hours as needed for moderate pain.   [name] ALPRAZolam (XANAX) 0.25 MG tablet Take 1 tablet (0.25 mg total) by mouth daily as needed for anxiety.  [name] aspirin EC 81 MG EC tablet Take 1 tablet (81 mg total) by mouth daily.  . busPIRone (BUSPAR) 15 MG tablet Take 1 tablet (15 mg total) by mouth 3 (three) times daily.  . Coenzyme Q10 (CO Q10) 100 MG CAPS Take 1 capsule by mouth daily.  . cyanocobalamin (,VITAMIN B-12,) 1000 MCG/ML injection Inject 1 mL (1,000 mcg total) into the muscle every 30 (thirty) days.  . DULoxetine (CYMBALTA) 30 MG capsule Take 2  capsules (60 mg total) by mouth daily.  . Insulin Pen Needle (BD PEN NEEDLE MICRO U/F) 32G X 6 MM MISC UAD  . Liraglutide -Weight Management (SAXENDA) 18 MG/3ML SOPN Inject 0.1 mLs (0.6 mg total) into the skin daily for 28 days, THEN 0.2 mLs (1.2 mg total) daily.  [name] losartan-hydrochlorothiazide (HYZAAR) 100-25 MG tablet Take 1 tablet by mouth daily.  . metoprolol tartrate (LOPRESSOR) 25 MG tablet TAKE 1 TABLET BY MOUTH 2 TIMES DAILY.  . nitroGLYCERIN (NITROSTAT) 0.4 MG SL tablet 1 TABLET UNDER TONGUE EVERY 5 MINUTES UP TO 3 TIMES FOR CHEST PAIN THEN CALL DR IF NO RELIEF  . risperiDONE (RISPERDAL) 0.25 MG tablet Take 1 tablet (0.25 mg total) by mouth at bedtime.  . rosuvastatin (CRESTOR) 10 MG tablet Take 1 tablet (10 mg total) by mouth daily.  . traZODone (DESYREL) 50 MG tablet TAKE 1-2 TABLETS BY MOUTH AT BEDTIME AS NEEDED FOR SLEEP.  [name] VASCEPA 1 g CAPS TAKE 2 CAPULES BY MOUTH TWICE DAILY  . Vitamin D, Ergocalciferol, (DRISDOL) 1.25 MG (50000 UT) CAPS capsule Take 1 capsule (50,000 Units total) by mouth every 7 (seven) days.   Current Facility-Administered Medications for the 11/16/19 encounter (Office Visit) with Belva Crome, MD  Medication  . cyanocobalamin ((VITAMIN B-12)) injection 1,000 mcg     Allergies:   Patient has no known allergies.   Social History   Socioeconomic History  . Marital status: Single    Spouse name: Not on file  . Number of children: Not on file  . Years of education: Not on file  . Highest education level: Not on file  Occupational History  . 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 Smoking status: Former Smoker    Packs/day: 1.00    Years: 20.00    Pack years: 20.00    Types: Cigarettes    Quit date: 2010    Years since quitting: 11.2  . Smokeless tobacco: Never Used  Substance and Sexual Activity  . Alcohol use: Not Currently    Comment: Rare   . Drug use: No  .  Sexual activity: Yes    Birth control/protection: Post-menopausal  Other Topics Concern  . Not on file  Social History Narrative   Caffeine daily    Social Determinants of Health   Financial Resource Strain:   . Difficulty of Paying Living Expenses:   Food Insecurity:   . Worried About Charity fundraiser in the Last Year:   . Arboriculturist in the Last Year:   Transportation Needs:   . Film/video editor (Medical):   [name] Lack of Transportation (Non-Medical):   Physical Activity:   . Days of  Exercise per Week:   . Minutes of Exercise per Session:   Stress:   . Feeling of Stress :   Social Connections:   . Frequency of Communication with Friends and Family:   . Frequency of Social Gatherings with Friends and Family:   . Attends Religious Services:   . Active Member of Clubs or Organizations:   . Attends Archivist Meetings:   [name] Marital Status:      Family History: The patient's family history includes Arthritis in her father, sister, and sister; Cancer in her father; Dementia in her father; Diabetes in her paternal grandfather; Hyperlipidemia in her father, mother, sister, and sister; Hypertension in her father and mother; Other in her sister; Sleep apnea in her father. There is no history of Colon cancer.  ROS:   Please see the history of present illness.    History of 3 distinct episodes of diverticulitis.  The most recent one led to colostomy and prolonged antibiotic therapy to resolve abscesses.  She has now back at work.  All other systems reviewed and are negative.  EKGs/Labs/Other Studies Reviewed:    The following studies were reviewed today: No new data  EKG:  EKG performed on 22 July 2019 demonstrates sinus rhythm, normal PR interval, and overall normal appearance.  Recent Labs: 08/02/2019: Magnesium 1.8 11/01/2019: ALT 12; BUN 15; Creatinine, Ser 0.69; Hemoglobin 15.4; Platelets 231; Potassium 4.1; Sodium 141; TSH 2.310  Recent Lipid Panel      Component Value Date/Time   CHOL 152 11/01/2019 0800   TRIG 169 (H) 11/01/2019 0800   TRIG 156 (H) 06/06/2013 1147   HDL 38 (L) 11/01/2019 0800   HDL 50 06/06/2013 1147   CHOLHDL 4.0 11/01/2019 0800   CHOLHDL 4.4 01/30/2019 0224   VLDL 19 01/30/2019 0224   LDLCALC 85 11/01/2019 0800   LDLCALC 50 06/06/2013 1147    Physical Exam:    VS:  BP (!) 142/72   Pulse 70   Ht 5\' 3"  (1.6 m)   Wt 199 lb 6.4 oz (90.4 kg)   LMP 12/31/2013   SpO2 96%   BMI 35.32 kg/m     Wt Readings from Last 3 Encounters:  11/16/19 199 lb 6.4 oz (90.4 kg)  11/01/19 192 lb (87.1 kg)  08/02/19 213 lb (96.6 kg)     GEN: Morbid obesity.. No acute distress HEENT: Normal NECK: No JVD. LYMPHATICS: No lymphadenopathy CARDIAC: RRR without murmur, gallop, or edema. VASCULAR:  Normal Pulses. No bruits. RESPIRATORY:  Clear to auscultation without rales, wheezing or rhonchi  ABDOMEN: Soft, non-tender, non-distended, No pulsatile mass, MUSCULOSKELETAL: No deformity  SKIN: Warm and dry NEUROLOGIC:  Alert and oriented x 3 PSYCHIATRIC:  Normal affect   ASSESSMENT:    1. CAD S/P percutaneous coronary angioplasty   2. OSA on CPAP   3. Mixed hyperlipidemia   4. Essential hypertension   5. Educated about COVID-19 virus infection    PLAN:    In order of problems listed above:  1. Coronary artery disease is stable.  Secondary prevention discussed.  She needs to increase physical activity to 30 minutes at least 5 days/week or greater.  Last LDL was 85 on 10 mg of rosuvastatin 4 times per week.  We will attempt to double the dose to 20 mg 4 times per week.  A lipid panel will need to be done in approximately 2 months.  If she develops intolerance, we may have to add Zetia to 10 mg 4 times per  week of rosuvastatin. 2. Encourage CPAP use 3. Please see dialogue under CAD. 4. Blood pressure is a little elevated.  Exercise, salt restriction, and weight loss will help.  No further increase in medications at this  time. 5. COVID-19 vaccine has been received.  Mask wearing and social distancing is encouraged.  Overall education and awareness concerning primary/secondary risk prevention was discussed in detail: LDL less than 70, hemoglobin A1c less than 7, blood pressure target less than 130/80 mmHg, >150 minutes of moderate aerobic activity per week, avoidance of smoking, weight control (via diet and exercise), and continued surveillance/management of/for obstructive sleep apnea.    Medication Adjustments/Labs and Tests Ordered: Current medicines are reviewed at length with the patient today.  Concerns regarding medicines are outlined above.  No orders of the defined types were placed in this encounter.  No orders of the defined types were placed in this encounter.   There are no Patient Instructions on file for this visit.   Signed, Sinclair Grooms, MD  11/16/2019 3:44 PM    [facility]

## 2019-11-16 ENCOUNTER — Telehealth: Payer: Self-pay | Admitting: Family Medicine

## 2019-11-16 ENCOUNTER — Other Ambulatory Visit: Payer: Self-pay

## 2019-11-16 ENCOUNTER — Encounter: Payer: Self-pay | Admitting: Interventional Cardiology

## 2019-11-16 ENCOUNTER — Ambulatory Visit: Payer: No Typology Code available for payment source | Admitting: Interventional Cardiology

## 2019-11-16 ENCOUNTER — Other Ambulatory Visit: Payer: Self-pay | Admitting: Interventional Cardiology

## 2019-11-16 VITALS — BP 142/72 | HR 70 | Ht 63.0 in | Wt 199.4 lb

## 2019-11-16 DIAGNOSIS — Z7189 Other specified counseling: Secondary | ICD-10-CM

## 2019-11-16 DIAGNOSIS — E782 Mixed hyperlipidemia: Secondary | ICD-10-CM

## 2019-11-16 DIAGNOSIS — I10 Essential (primary) hypertension: Secondary | ICD-10-CM

## 2019-11-16 DIAGNOSIS — I2510 Atherosclerotic heart disease of native coronary artery without angina pectoris: Secondary | ICD-10-CM

## 2019-11-16 DIAGNOSIS — G4733 Obstructive sleep apnea (adult) (pediatric): Secondary | ICD-10-CM | POA: Diagnosis not present

## 2019-11-16 DIAGNOSIS — Z9989 Dependence on other enabling machines and devices: Secondary | ICD-10-CM

## 2019-11-16 DIAGNOSIS — Z9861 Coronary angioplasty status: Secondary | ICD-10-CM

## 2019-11-16 LAB — BAYER DCA HB A1C WAIVED: HB A1C (BAYER DCA - WAIVED): 5.6 % (ref <7.0)

## 2019-11-16 MED ORDER — ROSUVASTATIN CALCIUM 20 MG PO TABS: ORAL_TABLET | ORAL | 3 refills | Status: DC

## 2019-11-16 MED FILL — ROSUVASTATIN CALCIUM 20 MG: 20 | 84 days supply | Qty: 48 | Fill #0

## 2019-11-16 NOTE — Patient Instructions (Signed)
Medication Instructions:  1) INCREASE Crestor to 20mg  once daily four days per week  *If you need a refill on your cardiac medications before your next appointment, please call your pharmacy*   Lab Work: Liver and Lipid in 6-8 weeks.  You will need to be fasting for these labs (nothing to eat or drink after midnight except water and black coffee).  If you have labs (blood work) drawn today and your tests are completely normal, you will receive your results only by: [name] MyChart Message (if you have MyChart) OR . A paper copy in the mail If you have any lab test that is abnormal or we need to change your treatment, we will call you to review the results.   Testing/Procedures: None   Follow-Up: At [facility], you and your health needs are our priority.  As part of our continuing mission to provide you with exceptional heart care, we have created designated Provider Care Teams.  These Care Teams include your primary Cardiologist (physician) and Advanced Practice Providers (APPs -  Physician Assistants and Nurse Practitioners) who all work together to provide you with the care you need, when you need it.  We recommend signing up for the patient portal called "MyChart".  Sign up information is provided on this After Visit Summary.  MyChart is used to connect with patients for Virtual Visits (Telemedicine).  Patients are able to view lab/test results, encounter notes, upcoming appointments, etc.  Non-urgent messages can be sent to your provider as well.   To learn more about what you can do with MyChart, go to [email or web address].    Your next appointment:   12 month(s)  The format for your next appointment:   In Person  Provider:   You may see Dr. Daneen Schick or one of the following Advanced Practice Providers on your designated Care Team:    Truitt Merle, NP  Cecilie Kicks, NP  Kathyrn Drown, NP    Other Instructions

## 2019-11-16 NOTE — Telephone Encounter (Signed)
RESPONSE WAS Information regarding your request Prior Authorization is not required for this medication dosage form and strength at the quantity and days supply requested.

## 2019-11-25 MED FILL — busPIRone HCL 15 MG TABS: 15 | 30 days supply | Qty: 90 | Fill #4

## 2019-11-25 MED FILL — LOSARTAN-HCTZ 100-25 MG TAB: 100-25 | 30 days supply | Qty: 30 | Fill #2

## 2019-12-10 ENCOUNTER — Other Ambulatory Visit: Payer: Self-pay | Admitting: *Deleted

## 2019-12-10 MED ORDER — METOPROLOL TARTRATE 25 MG PO TABS: 25.0000 mg | ORAL_TABLET | Freq: Two times a day (BID) | ORAL | 1 refills | Status: DC

## 2019-12-14 ENCOUNTER — Telehealth: Payer: Self-pay | Admitting: Family Medicine

## 2019-12-14 MED ORDER — DOXYCYCLINE HYCLATE 100 MG PO TABS: 200.0000 mg | ORAL_TABLET | Freq: Once | ORAL | 0 refills | Status: AC

## 2019-12-14 NOTE — Telephone Encounter (Signed)
Prescription sent to pharmacy.

## 2019-12-16 ENCOUNTER — Other Ambulatory Visit: Payer: Self-pay | Admitting: Family

## 2019-12-16 MED ORDER — DOXYCYCLINE HYCLATE 100 MG PO TABS: 100.0000 mg | ORAL_TABLET | Freq: Two times a day (BID) | ORAL | 0 refills | Status: DC

## 2019-12-30 MED FILL — LOSARTAN-HCTZ 100-25 MG TAB: 100-25 | 30 days supply | Qty: 30 | Fill #3

## 2020-01-07 ENCOUNTER — Other Ambulatory Visit: Payer: Self-pay | Admitting: *Deleted

## 2020-01-07 MED ORDER — DULOXETINE HCL 30 MG PO CPEP: 60.0000 mg | ORAL_CAPSULE | Freq: Every day | ORAL | 0 refills | Status: DC

## 2020-01-14 MED FILL — VASCEPA 1 GM CAPSULE: 1 | 90 days supply | Qty: 360 | Fill #2

## 2020-01-20 MED FILL — traZODone HCL 50 MG TABS: 50 | 45 days supply | Qty: 90 | Fill #2

## 2020-01-20 MED FILL — busPIRone HCL 15 MG TABS: 15 | 30 days supply | Qty: 90 | Fill #5

## 2020-01-28 MED FILL — LOSARTAN-HCTZ 100-25 MG TAB: 100-25 | 30 days supply | Qty: 30 | Fill #4

## 2020-02-24 MED FILL — LOSARTAN-HCTZ 100-25 MG TAB: 100-25 | 30 days supply | Qty: 30 | Fill #5

## 2020-02-24 MED FILL — ROSUVASTATIN CALCIUM 20 MG: 20 | 84 days supply | Qty: 48 | Fill #1

## 2020-03-11 ENCOUNTER — Ambulatory Visit (INDEPENDENT_AMBULATORY_CARE_PROVIDER_SITE_OTHER): Payer: No Typology Code available for payment source | Admitting: Nurse Practitioner

## 2020-03-11 ENCOUNTER — Other Ambulatory Visit: Payer: Self-pay

## 2020-03-11 ENCOUNTER — Encounter: Payer: Self-pay | Admitting: Nurse Practitioner

## 2020-03-11 VITALS — BP 126/68 | HR 78 | Temp 98.0°F | Ht 63.0 in | Wt 191.0 lb

## 2020-03-11 DIAGNOSIS — D229 Melanocytic nevi, unspecified: Secondary | ICD-10-CM

## 2020-03-11 DIAGNOSIS — D225 Melanocytic nevi of trunk: Secondary | ICD-10-CM

## 2020-03-11 NOTE — Progress Notes (Signed)
   Subjective:    Patient ID: Heather Lam, female    DOB: [birth year], 59 y.o.   MRN: [id number]  Patient comes in today for mole and skin tag removal.    Review of Systems  Constitutional: Negative for diaphoresis.  Eyes: Negative for pain.  Respiratory: Negative for shortness of breath.   Cardiovascular: Negative for chest pain, palpitations and leg swelling.  Gastrointestinal: Negative for abdominal pain.  Endocrine: Negative for polydipsia.  Skin: Negative for rash.  Neurological: Negative for dizziness, weakness and headaches.  Hematological: Does not bruise/bleed easily.  All other systems reviewed and are negative.      Objective:   Physical Exam Vitals and nursing note reviewed.  Constitutional:      Appearance: Normal appearance.  Cardiovascular:     Rate and Rhythm: Normal rate and regular rhythm.     Heart sounds: Normal heart sounds.  Skin:    General: Skin is warm and dry.     Findings: Lesion (3cm papular lesion flesh colored x2 on right breast and 2 on back) present.  Neurological:     Mental Status: She is alert.    BP 126/68   Pulse 78   Temp 98 F (36.7 C) (Temporal)   Ht 5\' 3"  (1.6 m)   Wt 191 lb (86.6 kg)   LMP 12/31/2013   BMI 33.83 kg/m   PROCEDURE  Consent:    Consent obtained:  verbal   Consent given by:  patent   Risks discussed:  possible infection   Alternatives discussed:  none other then leave alone Wound    Type:  mole x4   Size:  2-3cm   Location: right breast and back Pre-procedure details:    Skin preparation:  betadine Anesthesia     Anesthesia method:  local   Local anesthetic: lidocaine 1% with epi Procedure type:    Complexity:  simple Procedure details:    Incision types:  scraping   Scalpel blade:  15   Wound management:  cleaned with saline   Drainage appearance:  none   Drainage amount:  none   Wound treatment:  n/a   Packing materials: n/a Post-procedure details:    Dressing: antibiotic ointment and  bandaid   Patient tolerance of procedure:  no problems        Assessment & Plan:  Heather Lam in today with chief complaint of Nevus   1. Fleshy skin mole Right breast x2 and back x2 Keep clean and dry Watch for signs of infection RTO prn    The above assessment and management plan was discussed with the patient. The patient verbalized understanding of and has agreed to the management plan. Patient is aware to call the clinic if symptoms persist or worsen. Patient is aware when to return to the clinic for a follow-up visit. Patient educated on when it is appropriate to go to the emergency department.   Mary-Margaret Hassell Done, FNP

## 2020-03-16 MED FILL — METOPROLOL TARTRATE 25 MG T: 25 | 90 days supply | Qty: 180 | Fill #1

## 2020-03-23 MED FILL — LOSARTAN-HCTZ 100-25 MG TAB: 100-25 | 30 days supply | Qty: 30 | Fill #6

## 2020-03-27 ENCOUNTER — Encounter: Payer: Self-pay | Admitting: Nurse Practitioner

## 2020-03-27 ENCOUNTER — Ambulatory Visit (INDEPENDENT_AMBULATORY_CARE_PROVIDER_SITE_OTHER): Payer: No Typology Code available for payment source | Admitting: Nurse Practitioner

## 2020-03-27 ENCOUNTER — Other Ambulatory Visit: Payer: Self-pay

## 2020-03-27 ENCOUNTER — Ambulatory Visit: Payer: No Typology Code available for payment source | Attending: Internal Medicine

## 2020-03-27 DIAGNOSIS — L918 Other hypertrophic disorders of the skin: Secondary | ICD-10-CM | POA: Diagnosis not present

## 2020-03-27 DIAGNOSIS — Z23 Encounter for immunization: Secondary | ICD-10-CM

## 2020-03-27 NOTE — Progress Notes (Signed)
   Subjective:    Patient ID: Heather Lam, female    DOB: February 27, 1961, 59 y.o.   MRN: [id number]  CC: SKin tag removal  Patient come sin to have multiple skin tags removed from foreahead and right flank.   Review of Systems  Constitutional: Negative for diaphoresis.  Eyes: Negative for pain.  Respiratory: Negative for shortness of breath.   Cardiovascular: Negative for chest pain, palpitations and leg swelling.  Gastrointestinal: Negative for abdominal pain.  Endocrine: Negative for polydipsia.  Skin: Negative for rash.  Neurological: Negative for dizziness, weakness and headaches.  Hematological: Does not bruise/bleed easily.  All other systems reviewed and are negative.      Objective:   Physical Exam Skin:    Findings: Lesion (skin tage sin foreahead and left flank) present.    Cleaned with betadine Removed with stevens scissors and pickups. 2 from left eyelid, multipe form forehead and 2 from right flank       Assessment & Plan:  Heather Lam in today with chief complaint of skin tags  1. Skin tags, multiple acquired Keep clean and dry Watch for signs of infection RTO prn    The above assessment and management plan was discussed with the patient. The patient verbalized understanding of and has agreed to the management plan. Patient is aware to call the clinic if symptoms persist or worsen. Patient is aware when to return to the clinic for a follow-up visit. Patient educated on when it is appropriate to go to the emergency department.   Mary-Margaret Hassell Done, FNP

## 2020-03-27 NOTE — Progress Notes (Signed)
   Covid-19 Vaccination Clinic  Name:  Heather Lam    MRN: [id number] DOB: [birth year]  03/27/2020  [name] was observed post Covid-19 immunization for 15 minutes without incident. She was provided with Vaccine Information Sheet and instruction to access the V-Safe system.   [name] was instructed to call 911 with any severe reactions post vaccine: [name] Difficulty breathing  . Swelling of face and throat  . A fast heartbeat  . A bad rash all over body  . Dizziness and weakness   Immunizations Administered    Name Date Dose VIS Date Route   Pfizer COVID-19 Vaccine 03/27/2020  3:40 PM 0.3 mL 09/26/2018 Intramuscular   Manufacturer: [place]   Lot: [id number]   [place]: [id number]    .

## 2020-03-31 ENCOUNTER — Encounter: Payer: Self-pay | Admitting: Family Medicine

## 2020-03-31 ENCOUNTER — Ambulatory Visit (INDEPENDENT_AMBULATORY_CARE_PROVIDER_SITE_OTHER): Payer: No Typology Code available for payment source | Admitting: Family Medicine

## 2020-03-31 ENCOUNTER — Other Ambulatory Visit: Payer: Self-pay

## 2020-03-31 VITALS — BP 133/70 | HR 72 | Temp 97.8°F | Ht 63.0 in | Wt 187.0 lb

## 2020-03-31 DIAGNOSIS — G478 Other sleep disorders: Secondary | ICD-10-CM

## 2020-03-31 DIAGNOSIS — Z720 Tobacco use: Secondary | ICD-10-CM | POA: Diagnosis not present

## 2020-03-31 DIAGNOSIS — G4733 Obstructive sleep apnea (adult) (pediatric): Secondary | ICD-10-CM | POA: Diagnosis not present

## 2020-03-31 DIAGNOSIS — Z9989 Dependence on other enabling machines and devices: Secondary | ICD-10-CM

## 2020-03-31 NOTE — Progress Notes (Signed)
Subjective: CC: Nighttime awakenings PCP: Heather Norlander, DO [facility]:Heather Lam is a 59 y.o. female presenting to clinic today for:  1.  Nighttime awakenings Patient reports over the last couple of months she is been having increased nighttime awakenings.  She wakes up 2-3 times per night.  Denies any nocturia, coughing, gasping, choking or panic attacks.  Her mood has been well controlled with Cymbalta.  She successfully weaned from Risperdal and buspirone.  She is only using Xanax every once in a while, less than 1 time per month.  She uses trazodone 100 mg nightly which has been working really well for her but this has not been keeping her sleep.  She has no problems falling asleep.  She had previous intolerance to Ambien, which caused her sleepwalk.  She does not want to be on anything addictive.  Of note, medical history is significant for OSA.  She has not been compliant with CPAP   ROS: Per HPI  No Known Allergies Past Medical History:  Diagnosis Date   Anxiety    Back pain    CAD (coronary artery disease)    Chest pain    Complication of anesthesia    Constipation    Diverticulitis    Diverticulosis    Heart attack ([place]) 2010   History of carpal tunnel syndrome    Bilateral   History of kidney stones    Hyperlipidemia    Hypertension    Joint pain    Migraines    PONV (postoperative nausea and vomiting)    Skin cancer    on nose   Sleep apnea    wears CPAP nightly   Vitamin D deficiency     Current Outpatient Medications:    acetaminophen (TYLENOL) 500 MG tablet, Take 500-1,000 mg by mouth every 6 (six) hours as needed for moderate pain. , Disp: , Rfl:    ALPRAZolam (XANAX) 0.25 MG tablet, Take 1 tablet (0.25 mg total) by mouth daily as needed for anxiety., Disp: 30 tablet, Rfl: 0   aspirin EC 81 MG EC tablet, Take 1 tablet (81 mg total) by mouth daily., Disp: , Rfl:    Coenzyme Q10 (CO Q10) 100 MG CAPS, Take 1 capsule by mouth  daily., Disp: , Rfl:    cyanocobalamin (,VITAMIN B-12,) 1000 MCG/ML injection, Inject 1 mL (1,000 mcg total) into the muscle every 30 (thirty) days., Disp: 10 mL, Rfl: 0   DULoxetine (CYMBALTA) 30 MG capsule, Take 2 capsules (60 mg total) by mouth daily., Disp: 180 capsule, Rfl: 0   Insulin Pen Needle (BD PEN NEEDLE MICRO U/F) 32G X 6 MM MISC, UAD, Disp: 100 each, Rfl: 3   losartan-hydrochlorothiazide (HYZAAR) 100-25 MG tablet, Take 1 tablet by mouth daily., Disp: 90 tablet, Rfl: 2   metoprolol tartrate (LOPRESSOR) 25 MG tablet, Take 1 tablet (25 mg total) by mouth 2 (two) times daily., Disp: 180 tablet, Rfl: 1   nitroGLYCERIN (NITROSTAT) 0.4 MG SL tablet, 1 TABLET UNDER TONGUE EVERY 5 MINUTES UP TO 3 TIMES FOR CHEST PAIN THEN CALL DR IF NO RELIEF, Disp: 25 tablet, Rfl: 3   rosuvastatin (CRESTOR) 20 MG tablet, Take one tablet by mouth 4 days per week, Disp: 60 tablet, Rfl: 3   traZODone (DESYREL) 50 MG tablet, TAKE 1-2 TABLETS BY MOUTH AT BEDTIME AS NEEDED FOR SLEEP., Disp: 90 tablet, Rfl: 3   VASCEPA 1 g CAPS, TAKE 2 CAPULES BY MOUTH TWICE DAILY, Disp: 360 capsule, Rfl: 2  Current Facility-Administered Medications:  cyanocobalamin ((VITAMIN B-12)) injection 1,000 mcg, 1,000 mcg, Intramuscular, Q30 days, Jones, Angel S, PA-C, 1,000 mcg at 08/14/19 7209 Social History   Socioeconomic History   Marital status: Single    Spouse name: Not on file   Number of children: Not on file   Years of education: Not on file   Highest education level: Not on file  Occupational History   Occupation: Optician, dispensing: [employer]    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[employer] Use   Smoking status: Former Smoker    Packs/day: 1.00    Years: 20.00    Pack years: 20.00    Types: Cigarettes    Quit date: 2010    Years since quitting: 11.6   Smokeless tobacco: Never Used  Scientific laboratory technician Use: Never used  Substance and Sexual Activity   Alcohol use: Not  Currently    Comment: Rare    Drug use: No   Sexual activity: Yes    Birth control/protection: Post-menopausal  Other Topics Concern   Not on file  Social History Narrative   Caffeine daily    Social Determinants of Health   Financial Resource Strain:    Difficulty of Paying Living Expenses: Not on file  Food Insecurity:    Worried About [place] in the Last Year: Not on file   [employer] of Food in the Last Year: Not on file  Transportation Needs:    Lack of Transportation (Medical): Not on file   Lack of Transportation (Non-Medical): Not on file  Physical Activity:    Days of Exercise per Week: Not on file   Minutes of Exercise per Session: Not on file  Stress:    Feeling of Stress : Not on file  Social Connections:    Frequency of Communication with Friends and Family: Not on file   Frequency of Social Gatherings with Friends and Family: Not on file   Attends Religious Services: Not on file   Active Member of Clubs or Organizations: Not on file   Attends Archivist Meetings: Not on file   Marital Status: Not on file  Intimate Partner Violence:    Fear of Current or Ex-Partner: Not on file   Emotionally Abused: Not on file   Physically Abused: Not on file   Sexually Abused: Not on file   Family History  Problem Relation Age of Onset   Hyperlipidemia Mother    Hypertension Mother    Cancer Father        skin   Hyperlipidemia Father    Dementia Father    Arthritis Father    Hypertension Father    Sleep apnea Father    Other Sister        Myelofibrosis   Arthritis Sister    Hyperlipidemia Sister    Arthritis Sister    Hyperlipidemia Sister    Diabetes Paternal Grandfather    Colon cancer Neg Hx     Objective: Office vital signs reviewed. LMP 12/31/2013   Physical Examination:  General: Awake, alert, well nourished, No acute distress Pulm: Normal work of breathing on room air Psych: Mood stable,  speech normal, affect appropriate  Depression screen [facility] 2/9 03/31/2020 03/11/2020 11/01/2019  Decreased Interest 0 0 0  Down, Depressed, Hopeless 0 0 0  PHQ - 2 Score 0 0 0  Altered sleeping - - 0  Tired, decreased energy - - 0  Change in appetite - - 0  Feeling bad or failure about yourself  - - 0  Trouble concentrating - - 0  Moving slowly or fidgety/restless - - 0  Suicidal thoughts - - 0  PHQ-9 Score - - 0  Difficult doing work/chores - - -  Some recent data might be hidden    Assessment/ Plan: 59 y.o. female   1. Awakens from sleep at night Suspect due to noncompliance with CPAP.  Recommended trial of CPAP for next 1 to 2 weeks.  If persistent nighttime awakenings, we can either increase trazodone, switch trazodone to Belsomra or Rozerem.  Patient agreement of plan.  She will follow-up as needed  2. OSA on CPAP  3. Tobacco use Contemplative.  Discussed potential for Chantix.   No orders of the defined types were placed in this encounter.  No orders of the defined types were placed in this encounter.    Heather Norlander, DO [place] [phone]

## 2020-04-03 MED FILL — traZODone HCL 50 MG TABS: 50 | 45 days supply | Qty: 90 | Fill #3

## 2020-04-17 ENCOUNTER — Ambulatory Visit: Payer: No Typology Code available for payment source

## 2020-04-18 ENCOUNTER — Ambulatory Visit (INDEPENDENT_AMBULATORY_CARE_PROVIDER_SITE_OTHER): Payer: No Typology Code available for payment source

## 2020-04-18 DIAGNOSIS — Z23 Encounter for immunization: Secondary | ICD-10-CM | POA: Diagnosis not present

## 2020-04-18 NOTE — Progress Notes (Signed)
   Covid-19 Vaccination Clinic  Name:  Heather Lam    MRN: [id number] DOB: 12-13-1960  04/18/2020  [name] was observed post Covid-19 immunization for 15 minutes without incident. She was provided with Vaccine Information Sheet and instruction to access the V-Safe system.   [name] was instructed to call 911 with any severe reactions post vaccine: [name] Difficulty breathing  . Swelling of face and throat  . A fast heartbeat  . A bad rash all over body  . Dizziness and weakness   Immunizations Administered    Name Date Dose VIS Date Route   Pfizer COVID-19 Vaccine 04/18/2020 12:20 PM 0.3 mL 09/26/2018 Intramuscular   Manufacturer: [place]   Lot: [id number]   [place]: [id number]

## 2020-04-20 MED FILL — LOSARTAN-HCTZ 100-25 MG TAB: 100-25 | 30 days supply | Qty: 30 | Fill #7

## 2020-04-21 ENCOUNTER — Other Ambulatory Visit: Payer: Self-pay | Admitting: *Deleted

## 2020-04-21 ENCOUNTER — Other Ambulatory Visit: Payer: Self-pay | Admitting: Family Medicine

## 2020-04-21 MED ORDER — DULOXETINE HCL 30 MG PO CPEP: 60.0000 mg | ORAL_CAPSULE | Freq: Every day | ORAL | 0 refills | Status: DC

## 2020-04-21 MED FILL — DULoxetine HCL 30 MG CPEP: 30 | 90 days supply | Qty: 180 | Fill #0

## 2020-04-24 ENCOUNTER — Ambulatory Visit: Payer: No Typology Code available for payment source | Admitting: Nurse Practitioner

## 2020-04-28 ENCOUNTER — Telehealth: Payer: Self-pay | Admitting: Pharmacist

## 2020-04-28 MED ORDER — SAXENDA 18 MG/3ML ~~LOC~~ SOPN: PEN_INJECTOR | SUBCUTANEOUS | 1 refills | Status: DC

## 2020-04-28 NOTE — Telephone Encounter (Signed)
Saxenda refilled--titrtate to max tolerated dose Goal is 3mg  daily

## 2020-05-01 ENCOUNTER — Telehealth: Payer: Self-pay | Admitting: Pharmacist

## 2020-05-01 NOTE — Telephone Encounter (Signed)
Heather Lam (Key: Camillo Flaming) Saxenda 18MG /3ML pen-injectors  Submitted on cover my meds; f/u in 3 business days

## 2020-05-02 ENCOUNTER — Other Ambulatory Visit: Payer: Self-pay | Admitting: Family Medicine

## 2020-05-02 MED ORDER — SAXENDA 18 MG/3ML ~~LOC~~ SOPN: PEN_INJECTOR | SUBCUTANEOUS | 1 refills | Status: DC

## 2020-05-02 MED FILL — SAXENDA 18 MG/3 ML PEN: 18 | 38 days supply | Qty: 15 | Fill #0

## 2020-05-02 NOTE — Telephone Encounter (Signed)
  Saxenda approved until 2022 Patient verbalizes understanding

## 2020-05-08 ENCOUNTER — Telehealth (INDEPENDENT_AMBULATORY_CARE_PROVIDER_SITE_OTHER): Payer: No Typology Code available for payment source | Admitting: *Deleted

## 2020-05-08 DIAGNOSIS — G43901 Migraine, unspecified, not intractable, with status migrainosus: Secondary | ICD-10-CM

## 2020-05-08 MED ORDER — KETOROLAC TROMETHAMINE 60 MG/2ML IM SOLN
60.0000 mg | Freq: Once | INTRAMUSCULAR | Status: AC
  Administered 2020-05-08: 60 mg via INTRAMUSCULAR

## 2020-05-08 NOTE — Telephone Encounter (Signed)
Pt states she has had a migraine since she woke up this morning and has taken Tylenol at 7 AM and then a Goody Powder around 8 without any relief. Per Dr Dettinger Toradol 60mg  given IM RUOQ and tolerated well.

## 2020-05-18 MED FILL — LOSARTAN-HCTZ 100-25 MG TAB: 100-25 | 30 days supply | Qty: 30 | Fill #8

## 2020-05-27 ENCOUNTER — Other Ambulatory Visit: Payer: Self-pay | Admitting: Family Medicine

## 2020-05-27 DIAGNOSIS — Z720 Tobacco use: Secondary | ICD-10-CM

## 2020-05-27 MED ORDER — CHANTIX STARTING MONTH PAK 0.5 MG X 11 & 1 MG X 42 PO TABS: ORAL_TABLET | ORAL | 0 refills | Status: DC

## 2020-05-27 MED ORDER — VARENICLINE TARTRATE 1 MG PO TABS: 1.0000 mg | ORAL_TABLET | Freq: Two times a day (BID) | ORAL | 3 refills | Status: DC

## 2020-06-06 ENCOUNTER — Other Ambulatory Visit: Payer: Self-pay | Admitting: Family Medicine

## 2020-06-06 DIAGNOSIS — K432 Incisional hernia without obstruction or gangrene: Secondary | ICD-10-CM

## 2020-06-08 MED FILL — ROSUVASTATIN CALCIUM 20 MG: 20 | 84 days supply | Qty: 48 | Fill #2

## 2020-06-23 ENCOUNTER — Other Ambulatory Visit: Payer: Self-pay | Admitting: Family Medicine

## 2020-06-23 MED ORDER — LOSARTAN POTASSIUM-HCTZ 100-25 MG PO TABS: 1.0000 | ORAL_TABLET | Freq: Every day | ORAL | 0 refills | Status: DC

## 2020-06-23 MED FILL — METOPROLOL TARTRATE 25 MG T: 25 | 90 days supply | Qty: 180 | Fill #0

## 2020-06-23 MED FILL — LOSARTAN-HCTZ 100-25 MG TAB: 100-25 | 90 days supply | Qty: 90 | Fill #0

## 2020-06-23 NOTE — Addendum Note (Signed)
Addended by: Antonietta Barcelona D on: 06/23/2020 11:45 AM   Modules accepted: Orders

## 2020-06-30 ENCOUNTER — Other Ambulatory Visit: Payer: Self-pay | Admitting: Family Medicine

## 2020-06-30 ENCOUNTER — Telehealth: Payer: Self-pay | Admitting: *Deleted

## 2020-06-30 MED ORDER — ICOSAPENT ETHYL 1 G PO CAPS: ORAL_CAPSULE | ORAL | 1 refills | Status: DC

## 2020-06-30 MED FILL — VASCEPA 1 GM CAPSULE: 1 | 90 days supply | Qty: 360 | Fill #0

## 2020-06-30 NOTE — Telephone Encounter (Signed)
PLEASE REFILL VASCEPA

## 2020-07-08 ENCOUNTER — Other Ambulatory Visit: Payer: Self-pay | Admitting: *Deleted

## 2020-07-08 ENCOUNTER — Other Ambulatory Visit: Payer: Self-pay | Admitting: Family Medicine

## 2020-07-08 MED ORDER — TRAZODONE HCL 50 MG PO TABS: 50.0000 mg | ORAL_TABLET | Freq: Every evening | ORAL | 3 refills | Status: DC | PRN

## 2020-07-08 MED FILL — traZODone HCL 50 MG TABS: 50 | 45 days supply | Qty: 90 | Fill #0

## 2020-07-11 ENCOUNTER — Ambulatory Visit: Payer: Self-pay | Admitting: Surgery

## 2020-07-11 NOTE — H&P (Signed)
Surgical Evaluation  Chief Complaint: hernia  HPI: Very pleasant woman known to me, one year status post reversal of end colostomy after [place]'s in July 2020.  She has been doing well with her GI function and in general, however has started to have some pain and a noticeable bulge in the mid upper abdomen.  The pain is worse at the end of a long day of standing or with physical activity.  Denies any obstructive symptoms.  No Known Allergies  Past Medical History:  Diagnosis Date   Anxiety    Back pain    CAD (coronary artery disease)    Chest pain    Complication of anesthesia    Constipation    Coronary arteriosclerosis 05/04/2016   Diverticulitis    Diverticulosis    Heart attack ([place]) 2010   History of carpal tunnel syndrome    Bilateral   History of kidney stones    Hyperlipidemia    Hypertension    Intra-abdominal abscess (H[facility])    Joint pain    Migraines    Myocardial infarction ([place]) 05/04/2009   PONV (postoperative nausea and vomiting)    Skin cancer    on nose   Sleep apnea    wears CPAP nightly   Vitamin D deficiency     Past Surgical History:  Procedure Laterality Date   APPENDECTOMY     APPLICATION OF WOUND VAC N/A 02/09/2019   Procedure: APPLICATION OF WOUND VAC;  Surgeon: Clovis Riley, MD;  Location: [place];  Service: General;  Laterality: N/A;   BACK SURGERY     CARPAL TUNNEL RELEASE Right    CARPAL TUNNEL RELEASE Left 08/13/2016   Procedure: LEFT CARPAL TUNNEL RELEASE;  Surgeon: Roseanne Kaufman, MD;  Location: [place];  Service: Orthopedics;  Laterality: Left;   COLECTOMY WITH COLOSTOMY CREATION/HARTMANN PROCEDURE N/A 02/09/2019   Procedure: HARTMANN PROCEDURE;  Surgeon: Clovis Riley, MD;  Location: [place];  Service: General;  Laterality: N/A;   COLONOSCOPY     COLOSTOMY N/A 02/09/2019   Procedure: COLOSTOMY;  Surgeon: Clovis Riley, MD;  Location: [place];  Service: General;  Laterality: N/A;   COLOSTOMY REVERSAL N/A 07/30/2019    Procedure: LAPAROSCOPIC ASSISTED REVERSAL OF END COLOSTOMY WITH RIGID PROCTOSCOPY;  Surgeon: Clovis Riley, MD;  Location: [facility];  Service: General;  Laterality: N/A;   CORONARY STENT PLACEMENT     2 stents in the same artery   ENDOMETRIAL ABLATION  2005   [place] LITHOTRIPSY Right 06/27/2017   Procedure: RIGHT EXTRACORPOREAL SHOCK WAVE LITHOTRIPSY (ESWL);  Surgeon: Franchot Gallo, MD;  Location: [facility];  Service: Urology;  Laterality: Right;   KNEE ARTHROSCOPY WITH MENISCAL REPAIR Right    MOHS SURGERY     TUBAL LIGATION  1991    Family History  Problem Relation Age of Onset   Hyperlipidemia Mother    Hypertension Mother    Cancer Father        skin   Hyperlipidemia Father    Dementia Father    Arthritis Father    Hypertension Father    Sleep apnea Father    Other Sister        Myelofibrosis   Arthritis Sister    Hyperlipidemia Sister    Arthritis Sister    Hyperlipidemia Sister    Diabetes Paternal Grandfather    Colon cancer Neg Hx     Social History   Socioeconomic History   Marital status: Single  Spouse name: Not on file   Number of children: Not on file   Years of education: Not on file   Highest education level: Not on file  Occupational History   Occupation: Nurse    Employer: [employer]    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   Smoking status: Former Smoker    Packs/day: 1.00    Years: 20.00    Pack years: 20.00    Types: Cigarettes    Quit date: 2010    Years since quitting: 11.9   Smokeless tobacco: Never Used  Scientific laboratory technician Use: Never used  Substance and Sexual Activity   Alcohol use: Not Currently    Comment: Rare    Drug use: No   Sexual activity: Yes    Birth control/protection: Post-menopausal  Other Topics Concern   Not on file  Social History Narrative   Caffeine daily    Social Determinants of Health   Financial Resource Strain: Not on file  Food Insecurity:  Not on file  Transportation Needs: Not on file  Physical Activity: Not on file  Stress: Not on file  Social Connections: Not on file    Current Outpatient Medications on File Prior to Visit  Medication Sig Dispense Refill   acetaminophen (TYLENOL) 500 MG tablet Take 500-1,000 mg by mouth every 6 (six) hours as needed for moderate pain.      ALPRAZolam (XANAX) 0.25 MG tablet Take 1 tablet (0.25 mg total) by mouth daily as needed for anxiety. 30 tablet 0   aspirin EC 81 MG EC tablet Take 1 tablet (81 mg total) by mouth daily.     Coenzyme Q10 (CO Q10) 100 MG CAPS Take 1 capsule by mouth daily.     cyanocobalamin (,VITAMIN B-12,) 1000 MCG/ML injection Inject 1 mL (1,000 mcg total) into the muscle every 30 (thirty) days. 10 mL 0   DULoxetine (CYMBALTA) 30 MG capsule Take 2 capsules (60 mg total) by mouth daily. 180 capsule 0   icosapent Ethyl (VASCEPA) 1 g capsule TAKE 2 CAPULES BY MOUTH TWICE DAILY 360 capsule 1   Insulin Pen Needle (BD PEN NEEDLE MICRO U/F) 32G X 6 MM MISC UAD 100 each 3   Liraglutide -Weight Management (SAXENDA) 18 MG/3ML SOPN Initiate at 1.2MG  mg per day for one week. In weekly intervals, increase the dose until a dose of 3 mg is reached. 1.2MG  then 1.8MG  then 2.4MG  then 3MG  12 mL 1   losartan-hydrochlorothiazide (HYZAAR) 100-25 MG tablet Take 1 tablet by mouth daily. 90 tablet 0   metoprolol tartrate (LOPRESSOR) 25 MG tablet TAKE 1 TABLET BY MOUTH TWO TIMES DAILY 180 tablet 0   nitroGLYCERIN (NITROSTAT) 0.4 MG SL tablet 1 TABLET UNDER TONGUE EVERY 5 MINUTES UP TO 3 TIMES FOR CHEST PAIN THEN CALL DR IF NO RELIEF 25 tablet 3   rosuvastatin (CRESTOR) 20 MG tablet Take one tablet by mouth 4 days per week 60 tablet 3   traZODone (DESYREL) 50 MG tablet Take 1-2 tablets (50-100 mg total) by mouth at bedtime as needed. for sleep 90 tablet 3   varenicline (CHANTIX CONTINUING MONTH PAK) 1 MG tablet Take 1 tablet (1 mg total) by mouth 2 (two) times daily. 60 tablet 3   varenicline  (CHANTIX STARTING MONTH PAK) 0.5 MG X 11 & 1 MG X 42 tablet Take 0.5 mg tablet by mouth once daily x3 days, then 0.5 mg tablet twice daily x4 days, then increase to one  1 mg tablet twice daily. 53 tablet 0   Current Facility-Administered Medications on File Prior to Visit  Medication Dose Route Frequency Provider Last Rate Last Admin   cyanocobalamin ((VITAMIN B-12)) injection 1,000 mcg  1,000 mcg Intramuscular Q30 days Terald Sleeper, PA-C   1,000 mcg at 08/14/19 1025    Review of Systems: a complete, 10pt review of systems was completed with pertinent positives and negatives as documented in the HPI  Physical Exam: Vitals  Weight: 186.38 lb   Height: 62 in  Body Surface Area: 1.86 m   Body Mass Index: 34.09 kg/m   Temp.: 98 F    Pulse: 86 (Regular)    P.OX: 94% (Room air) BP: 140/82(Sitting, Left Arm, Standard)  Alert and well-appearing, Unlabored respirations Abdomen soft, nondistended, nontender. There is a protrusion at the upper aspect of her midline incision between this and her colostomy site with a vasalva, fascial defect is not easily palpable. There is also a approximately 1 cm umbilical hernia defect without any contents.    CBC Latest Ref Rng & Units 11/01/2019 08/02/2019 08/01/2019  WBC 3.4 - 10.8 x10E3/uL 10.3 11.2(H) 13.3(H)  Hemoglobin 11.1 - 15.9 g/dL 15.4 11.7(L) 11.8(L)  Hematocrit 34.0 - 46.6 % 45.7 37.3 38.4  Platelets 150 - 450 x10E3/uL 231 152 159    CMP Latest Ref Rng & Units 11/01/2019 08/02/2019 08/01/2019  Glucose 65 - 99 mg/dL 97 95 94  BUN 6 - 24 mg/dL 15 13 13   Creatinine 0.57 - 1.00 mg/dL 0.69 0.48 0.55  Sodium 134 - 144 mmol/L 141 139 139  Potassium 3.5 - 5.2 mmol/L 4.1 3.9 4.0  Chloride 96 - 106 mmol/L 99 104 108  CO2 20 - 29 mmol/L 29 26 26   Calcium 8.7 - 10.2 mg/dL 9.8 8.1(L) 8.1(L)  Total Protein 6.0 - 8.5 g/dL 6.8 - -  Total Bilirubin 0.0 - 1.2 mg/dL <0.2 - -  Alkaline Phos 39 - 117 IU/L 81 - -  AST 0 - 40 IU/L 13 - -  ALT 0 - 32 IU/L 12  - -    Lab Results  Component Value Date   INR 1.2 02/03/2019   INR 1.3 (H) 01/28/2019   INR 1.0 01/26/2019    Imaging: No results found.   A/P: INCISIONAL HERNIA (K43.2) Story: Not surprising given her previous course. We discussed the relevant anatomy and natural history of this. Options include observation which carries risk of increasing size or symptoms of that hernia and risk of incarceration, versus repair which I do recommend. We discussed the techniques of hernia repair in that I had recommended a laparoscopic approach with placement of mesh. She did have a 2 hour lysis of adhesions for her colostomy reversal so we discussed there is a possibility that she'll require a fair amount of adhesiolysis for this. Discussed risk of bleeding, infection, pain, scarring, bowel injury, mesh infection, fistula, ileus, hernia recurrence, as well as general risks including cardiovascular, thromboembolic or pulmonary. Questions were answered. She wishes to proceed with surgery.    Patient Active Problem List   Diagnosis Date Noted   History of colostomy reversal 07/30/2019   Cyst of ovary 02/20/2019   Kidney stone 02/20/2019   Migraine 02/20/2019   Diverticulitis 01/27/2019   Actinic keratoses 05/04/2017   History of MI (myocardial infarction) 04/01/2017   Prediabetes 09/28/2016   Vitamin D deficiency 08/16/2016   Recurrent major depressive disorder, in partial remission ([place]) 05/25/2016   Diverticular disease 05/04/2016   Malignant neoplasm  of skin 05/04/2016   Carpal tunnel syndrome 02/26/2016   Obesity (BMI 30-39.9) 02/25/2016   Insomnia 07/01/2015   Essential hypertension 05/21/2015   Generalized anxiety disorder 06/06/2013   CAD S/P percutaneous coronary angioplasty 09/13/2011   Antiplatelet or antithrombotic long-term use 09/13/2011   OSA on CPAP 09/13/2011   Hyperlipidemia 09/13/2011       Romana Juniper, [facility], PA  See AMION to contact  appropriate on-call provider

## 2020-07-14 ENCOUNTER — Ambulatory Visit: Payer: No Typology Code available for payment source | Admitting: Family Medicine

## 2020-07-22 ENCOUNTER — Other Ambulatory Visit: Payer: Self-pay | Admitting: Family Medicine

## 2020-07-22 ENCOUNTER — Telehealth: Payer: Self-pay | Admitting: *Deleted

## 2020-07-22 MED ORDER — DULOXETINE HCL 30 MG PO CPEP: 60.0000 mg | ORAL_CAPSULE | Freq: Every day | ORAL | 0 refills | Status: DC

## 2020-07-22 MED FILL — DULoxetine HCL 30 MG CPEP: 30 | 90 days supply | Qty: 180 | Fill #0

## 2020-07-22 NOTE — Telephone Encounter (Signed)
Refilled x 1, pt aware

## 2020-08-06 ENCOUNTER — Other Ambulatory Visit: Payer: Self-pay

## 2020-08-06 ENCOUNTER — Encounter: Payer: Self-pay | Admitting: Family Medicine

## 2020-08-06 ENCOUNTER — Ambulatory Visit (INDEPENDENT_AMBULATORY_CARE_PROVIDER_SITE_OTHER): Payer: No Typology Code available for payment source | Admitting: Family Medicine

## 2020-08-06 DIAGNOSIS — M5431 Sciatica, right side: Secondary | ICD-10-CM

## 2020-08-06 MED ORDER — DICLOFENAC SODIUM 75 MG PO TBEC: 75.0000 mg | DELAYED_RELEASE_TABLET | Freq: Two times a day (BID) | ORAL | 2 refills | Status: DC

## 2020-08-06 MED ORDER — KETOROLAC TROMETHAMINE 60 MG/2ML IM SOLN
60.0000 mg | Freq: Once | INTRAMUSCULAR | Status: AC
  Administered 2020-08-06: 60 mg via INTRAMUSCULAR

## 2020-08-06 MED ORDER — METHYLPREDNISOLONE ACETATE 80 MG/ML IJ SUSP
80.0000 mg | Freq: Once | INTRAMUSCULAR | Status: AC
Start: 2020-08-06 — End: 2020-08-06
  Administered 2020-08-06: 80 mg via INTRAMUSCULAR

## 2020-08-06 MED ORDER — PREDNISONE 20 MG PO TABS: ORAL_TABLET | ORAL | 0 refills | Status: DC

## 2020-08-06 NOTE — Progress Notes (Signed)
Subjective:   Patient ID: Heather Lam, female    DOB: 1961-05-22, 59 y.o.   MRN: [id number]  HPI: Heather Lam is a 60 y.o. female presenting on 08/06/2020 for No chief complaint on file.   HPI Patient comes in today with complaints of right-sided lower back pain going down the right back of her leg down the right lower side of her leg as well.  She says this is been going on for quite a few months but has worsened over the past couple months to where it is more constant over the past 3 weeks.  She says she is tried a steroid injection and Toradol previously and they did get some relief but did not last and came right back.  She has tried some oral agents over-the-counter including Tylenol that do give some help but are not resolving the issue.  She says that has continued to bother and just does not seem to be improving.  She does not want to do any x-rays but just wants to see if we can do something to get it to go away right now.  Relevant past medical, surgical, family and social history reviewed and updated as indicated. Interim medical history since our last visit reviewed. Allergies and medications reviewed and updated.  Review of Systems  Constitutional: Negative for chills and fever.  Eyes: Negative for visual disturbance.  Respiratory: Negative for chest tightness and shortness of breath.   Cardiovascular: Negative for chest pain and leg swelling.  Musculoskeletal: Positive for arthralgias and back pain. Negative for gait problem.  Skin: Negative for rash.  Neurological: Negative for weakness, light-headedness, numbness and headaches.  Psychiatric/Behavioral: Negative for agitation and behavioral problems.  All other systems reviewed and are negative.   Per HPI unless specifically indicated above   Allergies as of 08/06/2020   No Known Allergies     Medication List       Accurate as of August 06, 2020  2:38 PM. If you have any questions, ask your nurse or doctor.         acetaminophen 500 MG tablet Commonly known as: TYLENOL Take 500-1,000 mg by mouth every 6 (six) hours as needed for moderate pain.   ALPRAZolam 0.25 MG tablet Commonly known as: XANAX Take 1 tablet (0.25 mg total) by mouth daily as needed for anxiety.   aspirin 81 MG EC tablet Take 1 tablet (81 mg total) by mouth daily.   BD Pen Needle Micro U/F 32G X 6 MM Misc Generic drug: Insulin Pen Needle UAD   Co Q10 100 MG Caps Take 1 capsule by mouth daily.   cyanocobalamin 1000 MCG/ML injection Commonly known as: (VITAMIN B-12) Inject 1 mL (1,000 mcg total) into the muscle every 30 (thirty) days.   diclofenac 75 MG EC tablet Commonly known as: VOLTAREN Take 1 tablet (75 mg total) by mouth 2 (two) times daily. Started by: Worthy Rancher, MD   DULoxetine 30 MG capsule Commonly known as: CYMBALTA Take 2 capsules (60 mg total) by mouth daily.   icosapent Ethyl 1 g capsule Commonly known as: Vascepa TAKE 2 CAPULES BY MOUTH TWICE DAILY   losartan-hydrochlorothiazide 100-25 MG tablet Commonly known as: HYZAAR Take 1 tablet by mouth daily.   metoprolol tartrate 25 MG tablet Commonly known as: LOPRESSOR TAKE 1 TABLET BY MOUTH TWO TIMES DAILY   nitroGLYCERIN 0.4 MG SL tablet Commonly known as: Nitrostat 1 TABLET UNDER TONGUE EVERY 5 MINUTES UP TO 3 TIMES  FOR CHEST PAIN THEN CALL DR IF NO RELIEF   predniSONE 20 MG tablet Commonly known as: DELTASONE Take 3 tabs daily for 1 week, then 2 tabs daily for week 2, then 1 tab daily for week 3. Started by: Nils Pyle, MD   rosuvastatin 20 MG tablet Commonly known as: CRESTOR Take one tablet by mouth 4 days per week   Saxenda 18 MG/3ML Sopn Generic drug: Liraglutide -Weight Management Initiate at 1.2MG  mg per day for one week. In weekly intervals, increase the dose until a dose of 3 mg is reached. 1.2MG  then 1.8MG  then 2.4MG  then 3MG    traZODone 50 MG tablet Commonly known as: DESYREL Take 1-2 tablets (50-100  mg total) by mouth at bedtime as needed. for sleep   varenicline 1 MG tablet Commonly known as: Chantix Continuing Month Pak Take 1 tablet (1 mg total) by mouth 2 (two) times daily.   Chantix Starting Month Pak 0.5 MG X 11 & 1 MG X 42 tablet Generic drug: varenicline Take 0.5 mg tablet by mouth once daily x3 days, then 0.5 mg tablet twice daily x4 days, then increase to one 1 mg tablet twice daily.        Objective:   LMP 12/31/2013   Wt Readings from Last 3 Encounters:  03/31/20 187 lb (84.8 kg)  03/11/20 191 lb (86.6 kg)  11/16/19 199 lb 6.4 oz (90.4 kg)    Physical Exam Vitals and nursing note reviewed.  Constitutional:      General: She is not in acute distress.    Appearance: She is well-developed and well-nourished. She is not diaphoretic.  Eyes:     Extraocular Movements: EOM normal.     Conjunctiva/sclera: Conjunctivae normal.  Cardiovascular:     Pulses: Intact distal pulses.  Musculoskeletal:        General: No edema. Normal range of motion.     Lumbar back: Tenderness (Right lower back pain) present. No bony tenderness. Normal range of motion. Negative right straight leg raise test and negative left straight leg raise test.  Skin:    General: Skin is warm and dry.     Findings: No rash.  Neurological:     Mental Status: She is alert and oriented to person, place, and time.     Coordination: Coordination normal.  Psychiatric:        Mood and Affect: Mood and affect normal.        Behavior: Behavior normal.       Assessment & Plan:   Problem List Items Addressed This Visit   None   Visit Diagnoses    Right sciatic nerve pain    -  Primary   Relevant Medications   methylPREDNISolone acetate (DEPO-MEDROL) injection 80 mg (Start on 08/06/2020  2:45 PM)   ketorolac (TORADOL) injection 60 mg (Start on 08/06/2020  2:45 PM)   predniSONE (DELTASONE) 20 MG tablet   diclofenac (VOLTAREN) 75 MG EC tablet      Will treat like sciatica, may consider physical  therapy in the future Follow up plan: Return if symptoms worsen or fail to improve.   Counseling provided for all of the vaccine components No orders of the defined types were placed in this encounter.   10/04/2020, MD [facility] 08/06/2020, 2:38 PM

## 2020-08-07 ENCOUNTER — Ambulatory Visit: Payer: No Typology Code available for payment source | Admitting: Family Medicine

## 2020-08-15 ENCOUNTER — Other Ambulatory Visit: Payer: Self-pay | Admitting: Family

## 2020-08-15 MED ORDER — DOXYCYCLINE HYCLATE 100 MG PO TABS: 100.0000 mg | ORAL_TABLET | Freq: Two times a day (BID) | ORAL | 0 refills | Status: DC

## 2020-08-15 NOTE — Progress Notes (Signed)
PT calls with congestion and cough for 2 weeks. She is a smoker. Doxycycline Prescription sent to pharmacy.

## 2020-08-26 ENCOUNTER — Other Ambulatory Visit: Payer: Self-pay | Admitting: Family Medicine

## 2020-08-26 ENCOUNTER — Other Ambulatory Visit: Payer: No Typology Code available for payment source

## 2020-08-26 DIAGNOSIS — R509 Fever, unspecified: Secondary | ICD-10-CM

## 2020-08-28 LAB — SARS-COV-2, NAA 2 DAY TAT

## 2020-08-28 LAB — NOVEL CORONAVIRUS, NAA: SARS-CoV-2, NAA: DETECTED — AB

## 2020-08-29 ENCOUNTER — Encounter: Payer: Self-pay | Admitting: Physician Assistant

## 2020-08-29 ENCOUNTER — Telehealth (HOSPITAL_COMMUNITY): Payer: Self-pay | Admitting: Pharmacist

## 2020-08-29 ENCOUNTER — Other Ambulatory Visit: Payer: Self-pay | Admitting: Physician Assistant

## 2020-08-29 ENCOUNTER — Telehealth: Payer: Self-pay

## 2020-08-29 DIAGNOSIS — U071 COVID-19: Secondary | ICD-10-CM

## 2020-08-29 MED ORDER — MOLNUPIRAVIR EUA 200MG CAPSULE: 4.0000 | ORAL_CAPSULE | Freq: Two times a day (BID) | ORAL | 0 refills | Status: AC

## 2020-08-29 MED FILL — MOLNUPIRAVIR 200 MG CAPS: 200 | 5 days supply | Qty: 40 | Fill #0

## 2020-08-29 NOTE — Progress Notes (Signed)
Outpatient Oral COVID Treatment Note  I connected with Heather Lam on 08/29/2020/10:17 AM by telephone and verified that I am speaking with the correct person using two identifiers.  I discussed the limitations, risks, security, and privacy concerns of performing an evaluation and management service by telephone and the availability of in person appointments. I also discussed with the patient that there may be a patient responsible charge related to this service. The patient expressed understanding and agreed to proceed.  Patient location: home Provider location: office  Diagnosis: COVID-19 infection  Purpose of visit: Discussion of potential use of Molnupiravir or Paxlovid, a new treatment for mild to moderate COVID-19 viral infection in non-hospitalized patients.   Subjective: Patient is a 60 y.o. female who has been diagnosed with COVID 19 viral infection.  Their symptoms began on 1/25 with fever, body aches and cough.    Past Medical History:  Diagnosis Date  . Anxiety   . Back pain   . CAD (coronary artery disease)   . Chest pain   . Complication of anesthesia   . Constipation   . Coronary arteriosclerosis 05/04/2016  . Diverticulitis   . Diverticulosis   . Heart attack ([place]) 2010  . History of carpal tunnel syndrome    Bilateral  . History of kidney stones   . Hyperlipidemia   . Hypertension   . Intra-abdominal abscess ([place])   . Joint pain   . Migraines   . Morbid obesity ([place])   . Myocardial infarction ([place]) 05/04/2009  . PONV (postoperative nausea and vomiting)   . Skin cancer    on nose  . Sleep apnea    wears CPAP nightly  . Vitamin D deficiency     No Known Allergies   Current Outpatient Medications:  .  molnupiravir EUA 200 mg CAPS, Take 4 capsules (800 mg total) by mouth 2 (two) times daily for 5 days., Disp: 40 capsule, Rfl: 0 .  acetaminophen (TYLENOL) 500 MG tablet, Take 500-1,000 mg by mouth every 6 (six) hours as needed for moderate pain. , Disp: ,  Rfl:  .  ALPRAZolam (XANAX) 0.25 MG tablet, Take 1 tablet (0.25 mg total) by mouth daily as needed for anxiety., Disp: 30 tablet, Rfl: 0 .  aspirin EC 81 MG EC tablet, Take 1 tablet (81 mg total) by mouth daily., Disp: , Rfl:  .  Coenzyme Q10 (CO Q10) 100 MG CAPS, Take 1 capsule by mouth daily., Disp: , Rfl:  .  cyanocobalamin (,VITAMIN B-12,) 1000 MCG/ML injection, Inject 1 mL (1,000 mcg total) into the muscle every 30 (thirty) days., Disp: 10 mL, Rfl: 0 .  diclofenac (VOLTAREN) 75 MG EC tablet, Take 1 tablet (75 mg total) by mouth 2 (two) times daily., Disp: 60 tablet, Rfl: 2 .  doxycycline (VIBRA-TABS) 100 MG tablet, Take 1 tablet (100 mg total) by mouth 2 (two) times daily., Disp: 20 tablet, Rfl: 0 .  DULoxetine (CYMBALTA) 30 MG capsule, Take 2 capsules (60 mg total) by mouth daily., Disp: 180 capsule, Rfl: 0 .  icosapent Ethyl (VASCEPA) 1 g capsule, TAKE 2 CAPULES BY MOUTH TWICE DAILY, Disp: 360 capsule, Rfl: 1 .  Insulin Pen Needle (BD PEN NEEDLE MICRO U/F) 32G X 6 MM MISC, UAD, Disp: 100 each, Rfl: 3 .  Liraglutide -Weight Management (SAXENDA) 18 MG/3ML SOPN, Initiate at 1.2MG  mg per day for one week. In weekly intervals, increase the dose until a dose of 3 mg is reached. 1.2MG  then 1.8MG  then 2.4MG  then 3MG ,  Disp: 12 mL, Rfl: 1 .  losartan-hydrochlorothiazide (HYZAAR) 100-25 MG tablet, Take 1 tablet by mouth daily., Disp: 90 tablet, Rfl: 0 .  metoprolol tartrate (LOPRESSOR) 25 MG tablet, TAKE 1 TABLET BY MOUTH TWO TIMES DAILY, Disp: 180 tablet, Rfl: 0 .  nitroGLYCERIN (NITROSTAT) 0.4 MG SL tablet, 1 TABLET UNDER TONGUE EVERY 5 MINUTES UP TO 3 TIMES FOR CHEST PAIN THEN CALL DR IF NO RELIEF, Disp: 25 tablet, Rfl: 3 .  rosuvastatin (CRESTOR) 20 MG tablet, Take one tablet by mouth 4 days per week, Disp: 60 tablet, Rfl: 3 .  traZODone (DESYREL) 50 MG tablet, Take 1-2 tablets (50-100 mg total) by mouth at bedtime as needed. for sleep, Disp: 90 tablet, Rfl: 3 .  varenicline (CHANTIX CONTINUING MONTH  PAK) 1 MG tablet, Take 1 tablet (1 mg total) by mouth 2 (two) times daily., Disp: 60 tablet, Rfl: 3 .  varenicline (CHANTIX STARTING MONTH PAK) 0.5 MG X 11 & 1 MG X 42 tablet, Take 0.5 mg tablet by mouth once daily x3 days, then 0.5 mg tablet twice daily x4 days, then increase to one 1 mg tablet twice daily., Disp: 53 tablet, Rfl: 0  Current Facility-Administered Medications:  .  cyanocobalamin ((VITAMIN B-12)) injection 1,000 mcg, 1,000 mcg, Intramuscular, Q30 days, Particia Nearing S, PA-C, 1,000 mcg at 08/14/19 [id number]  Objective: Patient sounds normal on phone.  They are in no apparent distress.  Breathing is non labored.  Mood and behavior are normal.  Laboratory Data:  Recent Results (from the past 2160 hour(s))  Novel Coronavirus, NAA ([facility])     Status: Abnormal   Collection Time: 08/26/20  9:15 AM   Specimen: Nasopharyngeal(NP) swabs in vial transport medium  Result Value Ref Range   SARS-CoV-2, NAA Detected (A) Not Detected    Comment: Patients who have a positive COVID-19 test result may now have treatment options. Treatment options are available for patients with mild to moderate symptoms and for hospitalized patients. Visit our website at [email or web address] for resources and information. This nucleic acid amplification test was developed and its performance characteristics determined by [employer]. Nucleic acid amplification tests include RT-PCR and TMA. This test has not been FDA cleared or approved. This test has been authorized by FDA under an Emergency Use Authorization (EUA). This test is only authorized for the duration of time the declaration that circumstances exist justifying the authorization of the emergency use of in vitro diagnostic tests for detection of SARS-CoV-2 virus and/or diagnosis of COVID-19 infection under section 564(b)(1) of the Act, 21 U.S.C. [id number]) (1), unless the authorization is terminated or revoked sooner. When  diagnostic testing is negativ e, the possibility of a false negative result should be considered in the context of a patient's recent exposures and the presence of clinical signs and symptoms consistent with COVID-19. An individual without symptoms of COVID-19 and who is not shedding SARS-CoV-2 virus would expect to have a negative (not detected) result in this assay.   SARS-COV-2, NAA 2 DAY TAT     Status: None   Collection Time: 08/26/20  9:15 AM  Result Value Ref Range   SARS-CoV-2, NAA 2 DAY TAT Performed      Assessment: 60 y.o. female with mild/moderate COVID 19 viral infection diagnosed on 1/25 at high risk for progression to severe COVID 19.  Plan:  This patient is a 60 y.o. female that meets the following criteria for Emergency Use Authorization of: Molnupiravir  1. Age >18 yr 2. SARS-COV-2 positive test 3. Symptom  onset < 5 days 4. Mild-to-moderate COVID disease with high risk for severe progression to hospitalization or death   I have spoken and communicated the following to the patient or parent/caregiver regarding: 1. Molnupiravir is an unapproved drug that is authorized for use under an [employer] production planner.  2. There are no adequate, approved, available products for the treatment of COVID-19 in adults who have mild-to-moderate COVID-19 and are at high risk for progressing to severe COVID-19, including hospitalization or death. 3. Other therapeutics are currently authorized. For additional information on all products authorized for treatment or prevention of COVID-19, please see [email or web address].  4. There are benefits and risks of taking this treatment as outlined in the "Fact Sheet for Patients and Caregivers."  5. "Fact Sheet for Patients and Caregivers" was reviewed with patient. A hard copy will be provided to patient from pharmacy prior to the patient  receiving treatment. 6. Patients should continue to self-isolate and use infection control measures (e.g., wear mask, isolate, social distance, avoid sharing personal items, clean and disinfect "high touch" surfaces, and frequent handwashing) according to CDC guidelines.  7. The patient or parent/caregiver has the option to accept or refuse treatment. 8. [place] has established a pregnancy surveillance program. 9. Females of childbearing potential should use a reliable method of contraception correctly and consistently, as applicable, for the duration of treatment and for 4 days after the last dose of Molnupiravir. 44. Males of reproductive potential who are sexually active with females of childbearing potential should use a reliable method of contraception correctly and consistently during treatment and for at least 3 months after the last dose. 11. Pregnancy status and risk was assessed. Patient verbalized understanding of precautions.   After reviewing above information with the patient, the patient agrees to receive molnupiravir.  Follow up instructions:    . Take prescription BID x 5 days as directed . Reach out to pharmacist for counseling on medication if desired . For concerns regarding further COVID symptoms please follow up with your PCP or urgent care . For urgent or life-threatening issues, seek care at your local emergency department  The patient was provided an opportunity to ask questions, and all were answered. The patient agreed with the plan and demonstrated an understanding of the instructions.   Script sent to [facility] and opted to pick up RX.  The patient was advised to call their PCP or seek an in-person evaluation if the symptoms worsen or if the condition fails to improve as anticipated.   I provided 15 minutes of non face-to-face telephone visit time during this encounter, and > 50% was spent counseling as documented under my  assessment & plan.  Angelena Form, PA-C 08/29/2020 /10:17 AM

## 2020-08-29 NOTE — Telephone Encounter (Signed)
Called to discuss with patient about COVID-19 symptoms and the use of one of the available treatments for those with mild to moderate Covid symptoms and at a high risk of hospitalization.  Pt appears to qualify for outpatient treatment due to co-morbid conditions and/or a member of an at-risk group in accordance with the FDA Emergency Use Authorization.    Symptom onset: 08/26/20 Vaccinated: Yes Booster? No Immunocompromised? No Qualifiers: HTN, Heart attach 2010  Discussed with patient via phone. I have asked them to expect a call from APP to discuss treatment options. Moved to pre-screened list.    Heather Lam

## 2020-08-29 NOTE — Telephone Encounter (Signed)
Patient was prescribed oral covid treatment molnupiravir and treatment note was reviewed. Medication has been received by [facility] and reviewed for appropriateness.  Drug Interactions or Dosage Adjustments Noted: None  Delivery Method: Spouse picking up  Patient contacted for counseling on 08/29/2020 and verbalized understanding.   Delivery or Pick-Up Date: 08/29/2020  Deidre Ala 08/29/2020, 10:33 AM [facility] Outpatient Pharmacist Phone# [phone]

## 2020-09-03 ENCOUNTER — Encounter (HOSPITAL_COMMUNITY): Payer: No Typology Code available for payment source

## 2020-09-05 ENCOUNTER — Encounter: Payer: Self-pay | Admitting: Family

## 2020-09-05 ENCOUNTER — Ambulatory Visit (INDEPENDENT_AMBULATORY_CARE_PROVIDER_SITE_OTHER): Payer: No Typology Code available for payment source | Admitting: Family

## 2020-09-05 ENCOUNTER — Ambulatory Visit (INDEPENDENT_AMBULATORY_CARE_PROVIDER_SITE_OTHER): Payer: No Typology Code available for payment source

## 2020-09-05 VITALS — BP 133/78 | HR 69 | Temp 97.6°F | Ht 63.0 in | Wt 181.0 lb

## 2020-09-05 DIAGNOSIS — R059 Cough, unspecified: Secondary | ICD-10-CM

## 2020-09-05 DIAGNOSIS — E669 Obesity, unspecified: Secondary | ICD-10-CM

## 2020-09-05 DIAGNOSIS — F17200 Nicotine dependence, unspecified, uncomplicated: Secondary | ICD-10-CM

## 2020-09-05 DIAGNOSIS — U071 COVID-19: Secondary | ICD-10-CM

## 2020-09-05 NOTE — Progress Notes (Signed)
Subjective:    Patient ID: Heather Lam, female    DOB: 14-Sep-1960, 60 y.o.   MRN: [id number]  Chief Complaint  Patient presents with  . Cough   Pt presents to the office today with recurrent cough. She was diagnosed with COVID 08/26/20. She reports she started feeling slightly better on 08/31/20. However, on 09/03/20 she started feeling increased weakness, shakiness, congestion, and cough.   She completed prednisone this week and doxycycline.  Cough This is a new problem. The current episode started 1 to 4 weeks ago. The problem has been waxing and waning. The problem occurs every few minutes. The cough is non-productive. Associated symptoms include headaches, nasal congestion, postnasal drip and shortness of breath. Pertinent negatives include no chills, ear congestion, ear pain, fever, myalgias, sore throat, weight loss or wheezing. She has tried oral steroids and rest (mucinex) for the symptoms. The treatment provided mild relief.      Review of Systems  Constitutional: Negative for chills, fever and weight loss.  HENT: Positive for postnasal drip. Negative for ear pain and sore throat.   Respiratory: Positive for cough and shortness of breath. Negative for wheezing.   Musculoskeletal: Negative for myalgias.  Neurological: Positive for headaches.  All other systems reviewed and are negative.      Objective:   Physical Exam Vitals reviewed.  Constitutional:      General: She is not in acute distress.    Appearance: She is well-developed and well-nourished.  HENT:     Head: Normocephalic and atraumatic.     Right Ear: Tympanic membrane normal.     Left Ear: Tympanic membrane normal.     Mouth/Throat:     Mouth: Oropharynx is clear and moist.  Eyes:     Pupils: Pupils are equal, round, and reactive to light.  Neck:     Thyroid: No thyromegaly.  Cardiovascular:     Rate and Rhythm: Normal rate and regular rhythm.     Pulses: Intact distal pulses.     Heart sounds:  Normal heart sounds. No murmur heard.   Pulmonary:     Effort: Pulmonary effort is normal. No respiratory distress.     Breath sounds: Normal breath sounds. No wheezing.  Abdominal:     General: Bowel sounds are normal. There is no distension.     Palpations: Abdomen is soft.     Tenderness: There is no abdominal tenderness.  Musculoskeletal:        General: No tenderness or edema. Normal range of motion.     Cervical back: Normal range of motion and neck supple.  Skin:    General: Skin is warm and dry.  Neurological:     Mental Status: She is alert and oriented to person, place, and time.     Cranial Nerves: No cranial nerve deficit.     Deep Tendon Reflexes: Reflexes are normal and symmetric.  Psychiatric:        Mood and Affect: Mood and affect normal.        Behavior: Behavior normal.        Thought Content: Thought content normal.        Judgment: Judgment normal.       BP 133/78   Pulse 69   Temp 97.6 F (36.4 C) (Temporal)   Ht 5\' 3"  (1.6 m)   Wt 181 lb (82.1 kg)   LMP 12/31/2013   SpO2 97%   BMI 32.06 kg/m      Assessment & Plan:  Heather Lam comes in today with chief complaint of Cough   Diagnosis and orders addressed:  1. Cough - DG Chest 2 View; Future  2. Obesity (BMI 30-39.9)  3. Morbid obesity (Heather Lam)  4. COVID-19 virus detected  5. Smoker  Lungs and x-ray look negative Given symptoms, I believe the cough is from previous diagnoses of COVID. PT is decreasing smoking.  Force fluids Call if symptoms worsen or do not improve    Heather Dun, FNP

## 2020-09-05 NOTE — Patient Instructions (Signed)

## 2020-09-09 ENCOUNTER — Other Ambulatory Visit (HOSPITAL_COMMUNITY): Payer: No Typology Code available for payment source

## 2020-09-11 ENCOUNTER — Other Ambulatory Visit: Payer: Self-pay | Admitting: *Deleted

## 2020-09-12 ENCOUNTER — Other Ambulatory Visit: Payer: Self-pay | Admitting: Family Medicine

## 2020-09-12 MED ORDER — SAXENDA 18 MG/3ML ~~LOC~~ SOPN: PEN_INJECTOR | SUBCUTANEOUS | 1 refills | Status: DC

## 2020-09-12 MED FILL — SAXENDA 18 MG/3 ML PEN: 18 | 30 days supply | Qty: 15 | Fill #0

## 2020-09-29 NOTE — Progress Notes (Addendum)
PCP - Adam Phenix ,DO Cardiologist - Daneen Schick lov 4- 16-21 epic  PPM/ICD -  Device Orders -  Rep Notified -   Chest x-ray -  EKG - 10-01-20 Stress Test - 2019 epic ECHO - 01-2019 epic Cardiac Cath -   Sleep Study -  CPAP - not wearing   Fasting Blood Sugar -  Checks Blood Sugar _____ times a day  Blood Thinner Instructions: Aspirin Instructions: 81mg   ERAS Protcol - PRE-SURGERY   COVID TEST- COVID + 08-26-20    Activity--Able to walk a flight of stairs without SOB Anesthesia review: CAD stent x2,, MI, OSA, HTN  Patient denies shortness of breath, fever, cough and chest pain at PAT appointment  none   All instructions explained to the patient, with a verbal understanding of the material. Patient agrees to go over the instructions while at home for a better understanding. Patient also instructed to self quarantine after being tested for COVID-19. The opportunity to ask questions was provided.

## 2020-09-29 NOTE — Patient Instructions (Addendum)
DUE TO COVID-19 ONLY TWO  VISITORS are ALLOWED TO COME WITH YOU AND STAY IN THE WAITING ROOM ONLY DURING PRE OP AND PROCEDURE DAY OF SURGERY. Two  VISITOR  MAY VISIT WITH YOU AFTER SURGERY IN YOUR PRIVATE ROOM DURING VISITING HOURS ONLY!  YOU NEED TO HAVE A COVID 19 TEST ON___3-8-22____ @_______ , THIS TEST MUST BE DONE BEFORE SURGERY,  COVID TESTING SITE [address] [place] [address], IT IS ON THE RIGHT GOING OUT [address] APPROXIMATELY  2 MINUTES PAST [employer] ON THE RIGHT. ONCE YOUR COVID TEST IS COMPLETED,  PLEASE BEGIN THE QUARANTINE INSTRUCTIONS AS OUTLINED IN YOUR HANDOUT.                Heather Lam  09/29/2020   Your procedure is scheduled on: 10-10-20   Report to [facility] Main  Entrance   Report to short stay  at       0530   AM     Call this number if you have problems the morning of surgery [phone]    Remember: Do not eat food  :After Midnight.youi may have clear liquids until             0430 am then nothing by mouth    CLEAR LIQUID DIET Water                                                              Black Coffee and tea, regular and decaf                             Plain Jell-O any favor except red or purple                                         Fruit ices (not with fruit pulp)                                      Iced Popsicles                                     Carbonated beverages, regular and diet                                    Cranberry, grape and apple juices Sports drinks like Gatorade Lightly seasoned clear broth or consume(fat free) Sugar, honey syrup   _____________________________________________________________________     BRUSH YOUR TEETH MORNING OF SURGERY AND RINSE YOUR MOUTH OUT, NO CHEWING GUM CANDY OR MINTS.     Take these medicines the morning of surgery with A SIP OF WATER: crestor, metoprolol, cymbalta                                 You may not have any metal on your body  including hair pins and  piercings  Do not wear jewelry, make-up, lotions, powders or perfumes, deodorant             Do not wear nail polish on your fingernails.  Do not shave  48 hours prior to surgery.              .   Do not bring valuables to the hospital. [place].  Contacts, dentures or bridgework may not be worn into surgery.  Leave suitcase in the car. After surgery it may be brought to your room.     Patients discharged the day of surgery will not be allowed to drive home. IF YOU ARE HAVING SURGERY AND GOING HOME THE SAME DAY, YOU MUST HAVE AN ADULT TO DRIVE YOU HOME AND BE WITH YOU FOR 24 HOURS. YOU MAY GO HOME BY TAXI OR UBER OR ORTHERWISE, BUT AN ADULT MUST ACCOMPANY YOU HOME AND STAY WITH YOU FOR 24 HOURS.  Name and phone number of your driver:  Special Instructions: N/A              Please read over the following fact sheets you were given: _____________________________________________________________________             [facility] - Preparing for Surgery Before surgery, you can play an important role.  Because skin is not sterile, your skin needs to be as free of germs as possible.  You can reduce the number of germs on your skin by washing with CHG (chlorahexidine gluconate) soap before surgery.  CHG is an antiseptic cleaner which kills germs and bonds with the skin to continue killing germs even after washing. Please DO NOT use if you have an allergy to CHG or antibacterial soaps.  If your skin becomes reddened/irritated stop using the CHG and inform your nurse when you arrive at Short Stay. Do not shave (including legs and underarms) for at least 48 hours prior to the first CHG shower.  You may shave your face/neck. Please follow these instructions carefully:  1.  Shower with CHG Soap the night before surgery and the  morning of Surgery.  2.  If you choose to wash your hair, wash your hair first as usual with  your  normal  shampoo.  3.  After you shampoo, rinse your hair and body thoroughly to remove the  shampoo.                           4.  Use CHG as you would any other liquid soap.  You can apply chg directly  to the skin and wash                       Gently with a scrungie or clean washcloth.  5.  Apply the CHG Soap to your body ONLY FROM THE NECK DOWN.   Do not use on face/ open                           Wound or open sores. Avoid contact with eyes, ears mouth and genitals (private parts).                       Wash face,  Genitals (private parts) with your normal soap.  6.  Wash thoroughly, paying special attention to the area where your surgery  will be performed.  7.  Thoroughly rinse your body with warm water from the neck down.  8.  DO NOT shower/wash with your normal soap after using and rinsing off  the CHG Soap.                9.  Pat yourself dry with a clean towel.            10.  Wear clean pajamas.            11.  Place clean sheets on your bed the night of your first shower and do not  sleep with pets. Day of Surgery : Do not apply any lotions/deodorants the morning of surgery.  Please wear clean clothes to the hospital/surgery center.  FAILURE TO FOLLOW THESE INSTRUCTIONS MAY RESULT IN THE CANCELLATION OF YOUR SURGERY PATIENT SIGNATURE_________________________________  NURSE SIGNATURE__________________________________  ________________________________________________________________________

## 2020-10-01 ENCOUNTER — Encounter (HOSPITAL_COMMUNITY)
Admission: RE | Admit: 2020-10-01 | Discharge: 2020-10-01 | Disposition: A | Discharge status: Home / Self Care | Payer: No Typology Code available for payment source | Source: Ambulatory Visit | Attending: Surgery | Admitting: Surgery

## 2020-10-01 ENCOUNTER — Encounter (HOSPITAL_COMMUNITY): Payer: Self-pay

## 2020-10-01 ENCOUNTER — Other Ambulatory Visit: Payer: Self-pay

## 2020-10-01 DIAGNOSIS — Z01818 Encounter for other preprocedural examination: Secondary | ICD-10-CM | POA: Diagnosis not present

## 2020-10-01 HISTORY — DX: Prediabetes: R73.03

## 2020-10-01 HISTORY — DX: Family history of other specified conditions: Z84.89

## 2020-10-01 LAB — CBC
HCT: 43.7 % (ref 36.0–46.0)
Hemoglobin: 14.3 g/dL (ref 12.0–15.0)
MCH: 28.9 pg (ref 26.0–34.0)
MCHC: 32.7 g/dL (ref 30.0–36.0)
MCV: 88.3 fL (ref 80.0–100.0)
Platelets: 225 10*3/uL (ref 150–400)
RBC: 4.95 MIL/uL (ref 3.87–5.11)
RDW: 13.2 % (ref 11.5–15.5)
WBC: 12.1 10*3/uL — ABNORMAL HIGH (ref 4.0–10.5)
nRBC: 0 % (ref 0.0–0.2)

## 2020-10-01 LAB — HEMOGLOBIN A1C
Hgb A1c MFr Bld: 6.1 % — ABNORMAL HIGH (ref 4.8–5.6)
Mean Plasma Glucose: 128.37 mg/dL

## 2020-10-01 LAB — BASIC METABOLIC PANEL
Anion gap: 9 (ref 5–15)
BUN: 16 mg/dL (ref 6–20)
CO2: 28 mmol/L (ref 22–32)
Calcium: 9.4 mg/dL (ref 8.9–10.3)
Chloride: 103 mmol/L (ref 98–111)
Creatinine, Ser: 0.61 mg/dL (ref 0.44–1.00)
GFR, Estimated: >60 mL/min (ref >60)
Glucose, Bld: 80 mg/dL (ref 70–99)
Potassium: 3.9 mmol/L (ref 3.5–5.1)
Sodium: 140 mmol/L (ref 135–145)

## 2020-10-03 ENCOUNTER — Other Ambulatory Visit: Payer: Self-pay | Admitting: Family Medicine

## 2020-10-03 ENCOUNTER — Other Ambulatory Visit: Payer: Self-pay | Admitting: *Deleted

## 2020-10-03 MED ORDER — METOPROLOL TARTRATE 25 MG PO TABS: 25.0000 mg | ORAL_TABLET | Freq: Two times a day (BID) | ORAL | 1 refills | Status: DC

## 2020-10-03 MED ORDER — LOSARTAN POTASSIUM-HCTZ 100-25 MG PO TABS: 1.0000 | ORAL_TABLET | Freq: Every day | ORAL | 1 refills | Status: DC

## 2020-10-03 MED FILL — LOSARTAN-HCTZ 100-25 MG TAB: 100-25 | 30 days supply | Qty: 30 | Fill #0

## 2020-10-03 MED FILL — METOPROLOL TARTRATE 25 MG T: 25 | 90 days supply | Qty: 180 | Fill #0

## 2020-10-06 ENCOUNTER — Telehealth: Payer: Self-pay | Admitting: Nurse Practitioner

## 2020-10-06 MED ORDER — SULFAMETHOXAZOLE-TRIMETHOPRIM 800-160 MG PO TABS: 1.0000 | ORAL_TABLET | Freq: Two times a day (BID) | ORAL | 0 refills | Status: DC

## 2020-10-06 NOTE — Telephone Encounter (Signed)
Patient calls I c/o of an infected hair follicle  On left side of pubis. She has had one in the same area before.it is hard to the touch , erythematous and painful.

## 2020-10-06 NOTE — Telephone Encounter (Signed)
Patient calls in stating that she has an infected hair follicle on the left side of pubis. She has had one there in the past. She says it is about the size of quarter. Hard to touch, erythematous and painful.   Meds ordered this encounter  Medications  . sulfamethoxazole-trimethoprim (BACTRIM DS) 800-160 MG tablet    Sig: Take 1 tablet by mouth 2 (two) times daily.    Dispense:  20 tablet    Refill:  0    Order Specific Question:   Supervising Provider    Answer:   Caryl Pina A [[id number]]   Warm compresses Make an appointment if does not resolve.  Mary-Margaret Hassell Done, FNP

## 2020-10-07 ENCOUNTER — Other Ambulatory Visit (HOSPITAL_COMMUNITY): Payer: No Typology Code available for payment source

## 2020-10-07 MED FILL — traZODone HCL 50 MG TABS: 50 | 45 days supply | Qty: 90 | Fill #1

## 2020-10-09 MED ORDER — BUPIVACAINE LIPOSOME 1.3 % IJ SUSP
20.0000 mL | Freq: Once | INTRAMUSCULAR | Status: DC
  Filled 2020-10-09: qty 20

## 2020-10-09 NOTE — Anesthesia Preprocedure Evaluation (Addendum)
Anesthesia Evaluation  Patient identified by MRN, date of birth, ID band Patient awake    Reviewed: Allergy & Precautions, NPO status , Patient's Chart, lab work & pertinent test results  History of Anesthesia Complications (+) PONV and history of anesthetic complications  Airway Mallampati: III  TM Distance: >3 FB Neck ROM: Full    Dental no notable dental hx.    Pulmonary sleep apnea , former smoker,    Pulmonary exam normal breath sounds clear to auscultation       Cardiovascular hypertension, Pt. on home beta blockers and Pt. on medications + CAD, + Past MI and + Cardiac Stents  Normal cardiovascular exam Rhythm:Regular Rate:Normal     Neuro/Psych  Headaches, PSYCHIATRIC DISORDERS Anxiety Depression    GI/Hepatic negative GI ROS, Neg liver ROS,   Endo/Other  negative endocrine ROS  Renal/GU negative Renal ROS     Musculoskeletal negative musculoskeletal ROS (+)   Abdominal (+) + obese,   Peds  Hematology HLD   Anesthesia Other Findings INCISIONAL HERNIA  Reproductive/Obstetrics                            Anesthesia Physical Anesthesia Plan  ASA: III  Anesthesia Plan: General   Post-op Pain Management:    Induction: Intravenous  PONV Risk Score and Plan: 4 or greater and Ondansetron, Dexamethasone, Midazolam, Scopolamine patch - Pre-op, Treatment may vary due to age or medical condition, Amisulpride and Propofol infusion  Airway Management Planned: Oral ETT  Additional Equipment:   Intra-op Plan:   Post-operative Plan: Extubation in OR  Informed Consent: I have reviewed the patients History and Physical, chart, labs and discussed the procedure including the risks, benefits and alternatives for the proposed anesthesia with the patient or authorized representative who has indicated his/her understanding and acceptance.     Dental advisory given  Plan Discussed with:  CRNA  Anesthesia Plan Comments:        Anesthesia Quick Evaluation

## 2020-10-10 ENCOUNTER — Ambulatory Visit (HOSPITAL_COMMUNITY): Payer: No Typology Code available for payment source | Admitting: Certified Registered Nurse Anesthetist

## 2020-10-10 ENCOUNTER — Ambulatory Visit (HOSPITAL_COMMUNITY)
Admission: RE | Admit: 2020-10-10 | Discharge: 2020-10-11 | Disposition: A | Discharge status: Home / Self Care | Payer: No Typology Code available for payment source | Source: Ambulatory Visit | Attending: Surgery | Admitting: Surgery

## 2020-10-10 ENCOUNTER — Ambulatory Visit (HOSPITAL_COMMUNITY): Payer: No Typology Code available for payment source | Admitting: Physician Assistant

## 2020-10-10 ENCOUNTER — Encounter (HOSPITAL_COMMUNITY)
Admission: RE | Disposition: A | Discharge status: Home / Self Care | Payer: Self-pay | Source: Ambulatory Visit | Attending: Surgery

## 2020-10-10 ENCOUNTER — Encounter (HOSPITAL_COMMUNITY): Payer: Self-pay | Admitting: Surgery

## 2020-10-10 ENCOUNTER — Other Ambulatory Visit (HOSPITAL_COMMUNITY): Payer: Self-pay | Admitting: Surgery

## 2020-10-10 DIAGNOSIS — Z794 Long term (current) use of insulin: Secondary | ICD-10-CM | POA: Diagnosis not present

## 2020-10-10 DIAGNOSIS — Z955 Presence of coronary angioplasty implant and graft: Secondary | ICD-10-CM | POA: Insufficient documentation

## 2020-10-10 DIAGNOSIS — Z79899 Other long term (current) drug therapy: Secondary | ICD-10-CM | POA: Insufficient documentation

## 2020-10-10 DIAGNOSIS — Z85828 Personal history of other malignant neoplasm of skin: Secondary | ICD-10-CM | POA: Insufficient documentation

## 2020-10-10 DIAGNOSIS — Z7982 Long term (current) use of aspirin: Secondary | ICD-10-CM | POA: Insufficient documentation

## 2020-10-10 DIAGNOSIS — Z9049 Acquired absence of other specified parts of digestive tract: Secondary | ICD-10-CM | POA: Insufficient documentation

## 2020-10-10 DIAGNOSIS — Z87891 Personal history of nicotine dependence: Secondary | ICD-10-CM | POA: Diagnosis not present

## 2020-10-10 DIAGNOSIS — K432 Incisional hernia without obstruction or gangrene: Secondary | ICD-10-CM | POA: Diagnosis not present

## 2020-10-10 DIAGNOSIS — Z9889 Other specified postprocedural states: Secondary | ICD-10-CM

## 2020-10-10 DIAGNOSIS — Z8719 Personal history of other diseases of the digestive system: Secondary | ICD-10-CM

## 2020-10-10 HISTORY — PX: INCISIONAL HERNIA REPAIR: SHX193

## 2020-10-10 LAB — GLUCOSE, CAPILLARY: Glucose-Capillary: 127 mg/dL — ABNORMAL HIGH (ref 70–99)

## 2020-10-10 SURGERY — REPAIR, HERNIA, INCISIONAL, LAPAROSCOPIC
Anesthesia: General | Site: Abdomen

## 2020-10-10 MED ORDER — ACETAMINOPHEN 325 MG PO TABS: 650.0000 mg | ORAL_TABLET | ORAL | Status: DC | PRN

## 2020-10-10 MED ORDER — ONDANSETRON 4 MG PO TBDP: 4.0000 mg | ORAL_TABLET | Freq: Four times a day (QID) | ORAL | Status: DC | PRN

## 2020-10-10 MED ORDER — ACETAMINOPHEN 650 MG RE SUPP: 650.0000 mg | RECTAL | Status: DC | PRN

## 2020-10-10 MED ORDER — OXYCODONE HCL 5 MG PO TABS
5.0000 mg | ORAL_TABLET | Freq: Four times a day (QID) | ORAL | Status: DC | PRN
  Administered 2020-10-10 – 2020-10-11 (×2): 5 mg via ORAL
  Filled 2020-10-10 (×2): qty 1

## 2020-10-10 MED ORDER — PROPOFOL 10 MG/ML IV BOLUS
INTRAVENOUS | Status: AC
  Filled 2020-10-10: qty 20

## 2020-10-10 MED ORDER — FENTANYL CITRATE (PF) 100 MCG/2ML IJ SOLN: 25.0000 ug | INTRAMUSCULAR | Status: DC | PRN

## 2020-10-10 MED ORDER — GABAPENTIN 300 MG PO CAPS
300.0000 mg | ORAL_CAPSULE | Freq: Two times a day (BID) | ORAL | Status: DC
  Administered 2020-10-10 – 2020-10-11 (×2): 300 mg via ORAL
  Filled 2020-10-10 (×2): qty 1

## 2020-10-10 MED ORDER — SODIUM CHLORIDE 0.9 % IV SOLN: 250.0000 mL | INTRAVENOUS | Status: DC | PRN

## 2020-10-10 MED ORDER — METHOCARBAMOL 500 MG PO TABS: 500.0000 mg | ORAL_TABLET | Freq: Four times a day (QID) | ORAL | Status: DC | PRN

## 2020-10-10 MED ORDER — FENTANYL CITRATE (PF) 100 MCG/2ML IJ SOLN
INTRAMUSCULAR | Status: DC | PRN
  Administered 2020-10-10: 50 ug via INTRAVENOUS
  Administered 2020-10-10: 100 ug via INTRAVENOUS
  Administered 2020-10-10 (×2): 50 ug via INTRAVENOUS

## 2020-10-10 MED ORDER — KETAMINE HCL 10 MG/ML IJ SOLN
INTRAMUSCULAR | Status: DC | PRN
  Administered 2020-10-10: 25 mg via INTRAVENOUS

## 2020-10-10 MED ORDER — LIDOCAINE 2% (20 MG/ML) 5 ML SYRINGE
INTRAMUSCULAR | Status: AC
  Filled 2020-10-10: qty 5

## 2020-10-10 MED ORDER — CEFAZOLIN SODIUM-DEXTROSE 2-4 GM/100ML-% IV SOLN
2.0000 g | Freq: Three times a day (TID) | INTRAVENOUS | Status: AC
  Administered 2020-10-10: 2 g via INTRAVENOUS
  Filled 2020-10-10: qty 100

## 2020-10-10 MED ORDER — LOSARTAN POTASSIUM 50 MG PO TABS
100.0000 mg | ORAL_TABLET | Freq: Every day | ORAL | Status: DC
  Administered 2020-10-10 – 2020-10-11 (×2): 100 mg via ORAL
  Filled 2020-10-10 (×2): qty 2

## 2020-10-10 MED ORDER — PHENYLEPHRINE 40 MCG/ML (10ML) SYRINGE FOR IV PUSH (FOR BLOOD PRESSURE SUPPORT)
PREFILLED_SYRINGE | INTRAVENOUS | Status: DC | PRN
  Administered 2020-10-10: 40 ug via INTRAVENOUS
  Administered 2020-10-10: 80 ug via INTRAVENOUS

## 2020-10-10 MED ORDER — KETOROLAC TROMETHAMINE 15 MG/ML IJ SOLN: 15.0000 mg | Freq: Four times a day (QID) | INTRAMUSCULAR | Status: DC | PRN

## 2020-10-10 MED ORDER — GABAPENTIN 300 MG PO CAPS: 300.0000 mg | ORAL_CAPSULE | Freq: Two times a day (BID) | ORAL | Status: DC

## 2020-10-10 MED ORDER — DEXAMETHASONE SODIUM PHOSPHATE 10 MG/ML IJ SOLN
INTRAMUSCULAR | Status: AC
  Filled 2020-10-10: qty 1

## 2020-10-10 MED ORDER — ONDANSETRON HCL 4 MG/2ML IJ SOLN: 4.0000 mg | Freq: Four times a day (QID) | INTRAMUSCULAR | Status: DC | PRN

## 2020-10-10 MED ORDER — OXYCODONE HCL 5 MG PO TABS: 5.0000 mg | ORAL_TABLET | Freq: Once | ORAL | Status: DC | PRN

## 2020-10-10 MED ORDER — OXYCODONE HCL 5 MG/5ML PO SOLN: 5.0000 mg | Freq: Once | ORAL | Status: DC | PRN

## 2020-10-10 MED ORDER — GABAPENTIN 300 MG PO CAPS
300.0000 mg | ORAL_CAPSULE | ORAL | Status: AC
  Administered 2020-10-10: 300 mg via ORAL
  Filled 2020-10-10: qty 1

## 2020-10-10 MED ORDER — METOPROLOL TARTRATE 5 MG/5ML IV SOLN
5.0000 mg | Freq: Four times a day (QID) | INTRAVENOUS | Status: DC | PRN
  Filled 2020-10-10: qty 5

## 2020-10-10 MED ORDER — DULOXETINE HCL 60 MG PO CPEP
60.0000 mg | ORAL_CAPSULE | Freq: Every day | ORAL | Status: DC
Start: 2020-10-11 — End: 2020-10-11
  Administered 2020-10-11: 60 mg via ORAL
  Filled 2020-10-10: qty 1

## 2020-10-10 MED ORDER — BUPIVACAINE-EPINEPHRINE 0.25% -1:200000 IJ SOLN
INTRAMUSCULAR | Status: DC | PRN
  Administered 2020-10-10: 30 mL

## 2020-10-10 MED ORDER — SODIUM CHLORIDE 0.9% FLUSH: 3.0000 mL | Freq: Two times a day (BID) | INTRAVENOUS | Status: DC

## 2020-10-10 MED ORDER — HYDROMORPHONE HCL 1 MG/ML IJ SOLN
0.5000 mg | INTRAMUSCULAR | Status: DC | PRN
  Administered 2020-10-10 – 2020-10-11 (×3): 0.5 mg via INTRAVENOUS
  Filled 2020-10-10 (×3): qty 0.5

## 2020-10-10 MED ORDER — OXYCODONE HCL 5 MG PO TABS: 5.0000 mg | ORAL_TABLET | ORAL | Status: DC | PRN

## 2020-10-10 MED ORDER — DEXAMETHASONE SODIUM PHOSPHATE 10 MG/ML IJ SOLN
INTRAMUSCULAR | Status: DC | PRN
  Administered 2020-10-10: 5 mg via INTRAVENOUS

## 2020-10-10 MED ORDER — DIPHENHYDRAMINE HCL 25 MG PO CAPS: 25.0000 mg | ORAL_CAPSULE | Freq: Four times a day (QID) | ORAL | Status: DC | PRN

## 2020-10-10 MED ORDER — AMISULPRIDE (ANTIEMETIC) 5 MG/2ML IV SOLN: 10.0000 mg | Freq: Once | INTRAVENOUS | Status: DC | PRN

## 2020-10-10 MED ORDER — BUPIVACAINE-EPINEPHRINE (PF) 0.25% -1:200000 IJ SOLN
INTRAMUSCULAR | Status: AC
  Filled 2020-10-10: qty 30

## 2020-10-10 MED ORDER — ENOXAPARIN SODIUM 40 MG/0.4ML ~~LOC~~ SOLN
40.0000 mg | SUBCUTANEOUS | Status: DC
  Filled 2020-10-10: qty 0.4

## 2020-10-10 MED ORDER — HYDROCHLOROTHIAZIDE 25 MG PO TABS
25.0000 mg | ORAL_TABLET | Freq: Every day | ORAL | Status: DC
  Administered 2020-10-10 – 2020-10-11 (×2): 25 mg via ORAL
  Filled 2020-10-10 (×2): qty 1

## 2020-10-10 MED ORDER — VARENICLINE TARTRATE 1 MG PO TABS
1.0000 mg | ORAL_TABLET | Freq: Two times a day (BID) | ORAL | Status: DC
  Administered 2020-10-10 – 2020-10-11 (×2): 1 mg via ORAL
  Filled 2020-10-10 (×2): qty 1

## 2020-10-10 MED ORDER — DIPHENHYDRAMINE HCL 50 MG/ML IJ SOLN: 25.0000 mg | Freq: Four times a day (QID) | INTRAMUSCULAR | Status: DC | PRN

## 2020-10-10 MED ORDER — TRAMADOL HCL 50 MG PO TABS: 50.0000 mg | ORAL_TABLET | Freq: Four times a day (QID) | ORAL | Status: DC | PRN

## 2020-10-10 MED ORDER — INSULIN ASPART 100 UNIT/ML ~~LOC~~ SOLN: 0.0000 [IU] | Freq: Three times a day (TID) | SUBCUTANEOUS | Status: DC

## 2020-10-10 MED ORDER — LACTATED RINGERS IV SOLN: INTRAVENOUS | Status: DC

## 2020-10-10 MED ORDER — MIDAZOLAM HCL 5 MG/5ML IJ SOLN
INTRAMUSCULAR | Status: DC | PRN
  Administered 2020-10-10: 2 mg via INTRAVENOUS

## 2020-10-10 MED ORDER — KETOROLAC TROMETHAMINE 30 MG/ML IJ SOLN: 30.0000 mg | Freq: Once | INTRAMUSCULAR | Status: DC | PRN

## 2020-10-10 MED ORDER — BISACODYL 10 MG RE SUPP: 10.0000 mg | Freq: Every day | RECTAL | Status: DC | PRN

## 2020-10-10 MED ORDER — CHLORHEXIDINE GLUCONATE 0.12 % MT SOLN
15.0000 mL | Freq: Once | OROMUCOSAL | Status: AC
  Administered 2020-10-10: 15 mL via OROMUCOSAL

## 2020-10-10 MED ORDER — SUGAMMADEX SODIUM 200 MG/2ML IV SOLN
INTRAVENOUS | Status: DC | PRN
  Administered 2020-10-10: 200 mg via INTRAVENOUS

## 2020-10-10 MED ORDER — EPHEDRINE SULFATE-NACL 50-0.9 MG/10ML-% IV SOSY
PREFILLED_SYRINGE | INTRAVENOUS | Status: DC | PRN
  Administered 2020-10-10: 10 mg via INTRAVENOUS
  Administered 2020-10-10: 15 mg via INTRAVENOUS
  Administered 2020-10-10: 10 mg via INTRAVENOUS
  Administered 2020-10-10: 15 mg via INTRAVENOUS
  Administered 2020-10-10: 10 mg via INTRAVENOUS
  Administered 2020-10-10: 15 mg via INTRAVENOUS
  Administered 2020-10-10: 5 mg via INTRAVENOUS

## 2020-10-10 MED ORDER — INSULIN ASPART 100 UNIT/ML ~~LOC~~ SOLN: 0.0000 [IU] | Freq: Every day | SUBCUTANEOUS | Status: DC

## 2020-10-10 MED ORDER — SODIUM CHLORIDE 0.9% FLUSH: 3.0000 mL | INTRAVENOUS | Status: DC | PRN

## 2020-10-10 MED ORDER — SODIUM CHLORIDE 0.9 % IV SOLN: INTRAVENOUS | Status: DC

## 2020-10-10 MED ORDER — DOCUSATE SODIUM 100 MG PO CAPS
200.0000 mg | ORAL_CAPSULE | Freq: Every day | ORAL | Status: DC
Start: 2020-10-10 — End: 2020-10-11
  Administered 2020-10-10 – 2020-10-11 (×2): 200 mg via ORAL
  Filled 2020-10-10 (×2): qty 2

## 2020-10-10 MED ORDER — LOSARTAN POTASSIUM-HCTZ 100-25 MG PO TABS: 1.0000 | ORAL_TABLET | Freq: Every day | ORAL | Status: DC

## 2020-10-10 MED ORDER — ACETAMINOPHEN 500 MG PO TABS
1000.0000 mg | ORAL_TABLET | ORAL | Status: AC
  Administered 2020-10-10: 1000 mg via ORAL
  Filled 2020-10-10: qty 2

## 2020-10-10 MED ORDER — LIDOCAINE 2% (20 MG/ML) 5 ML SYRINGE
INTRAMUSCULAR | Status: DC | PRN
  Administered 2020-10-10: 60 mg via INTRAVENOUS

## 2020-10-10 MED ORDER — 0.9 % SODIUM CHLORIDE (POUR BTL) OPTIME
TOPICAL | Status: DC | PRN
  Administered 2020-10-10: 1000 mL

## 2020-10-10 MED ORDER — ROCURONIUM BROMIDE 10 MG/ML (PF) SYRINGE
PREFILLED_SYRINGE | INTRAVENOUS | Status: DC | PRN
  Administered 2020-10-10: 60 mg via INTRAVENOUS
  Administered 2020-10-10 (×2): 10 mg via INTRAVENOUS

## 2020-10-10 MED ORDER — FENTANYL CITRATE (PF) 250 MCG/5ML IJ SOLN
INTRAMUSCULAR | Status: AC
  Filled 2020-10-10: qty 5

## 2020-10-10 MED ORDER — ROCURONIUM BROMIDE 10 MG/ML (PF) SYRINGE
PREFILLED_SYRINGE | INTRAVENOUS | Status: AC
  Filled 2020-10-10: qty 10

## 2020-10-10 MED ORDER — BUPIVACAINE LIPOSOME 1.3 % IJ SUSP
INTRAMUSCULAR | Status: DC | PRN
  Administered 2020-10-10: 20 mL

## 2020-10-10 MED ORDER — ACETAMINOPHEN 500 MG PO TABS
1000.0000 mg | ORAL_TABLET | Freq: Four times a day (QID) | ORAL | Status: DC
  Administered 2020-10-10 – 2020-10-11 (×3): 1000 mg via ORAL
  Filled 2020-10-10 (×4): qty 2

## 2020-10-10 MED ORDER — PROPOFOL 10 MG/ML IV BOLUS
INTRAVENOUS | Status: DC | PRN
  Administered 2020-10-10: 200 mg via INTRAVENOUS

## 2020-10-10 MED ORDER — LIDOCAINE 2% (20 MG/ML) 5 ML SYRINGE
INTRAMUSCULAR | Status: AC
  Filled 2020-10-10: qty 10

## 2020-10-10 MED ORDER — METOPROLOL TARTRATE 25 MG PO TABS: 25.0000 mg | ORAL_TABLET | Freq: Two times a day (BID) | ORAL | Status: DC

## 2020-10-10 MED ORDER — HYDRALAZINE HCL 20 MG/ML IJ SOLN: 10.0000 mg | INTRAMUSCULAR | Status: DC | PRN

## 2020-10-10 MED ORDER — MIDAZOLAM HCL 2 MG/2ML IJ SOLN
INTRAMUSCULAR | Status: AC
  Filled 2020-10-10: qty 2

## 2020-10-10 MED ORDER — AMISULPRIDE (ANTIEMETIC) 5 MG/2ML IV SOLN
INTRAVENOUS | Status: AC
  Filled 2020-10-10: qty 2

## 2020-10-10 MED ORDER — CEFAZOLIN SODIUM-DEXTROSE 2-4 GM/100ML-% IV SOLN
2.0000 g | INTRAVENOUS | Status: AC
  Administered 2020-10-10: 2 g via INTRAVENOUS
  Filled 2020-10-10: qty 100

## 2020-10-10 MED ORDER — ONDANSETRON HCL 4 MG/2ML IJ SOLN
INTRAMUSCULAR | Status: AC
  Filled 2020-10-10: qty 2

## 2020-10-10 MED ORDER — ORAL CARE MOUTH RINSE: 15.0000 mL | Freq: Once | OROMUCOSAL | Status: AC

## 2020-10-10 MED ORDER — CHLORHEXIDINE GLUCONATE 4 % EX LIQD: 60.0000 mL | Freq: Once | CUTANEOUS | Status: DC

## 2020-10-10 MED ORDER — OXYCODONE HCL 5 MG PO TABS: 5.0000 mg | ORAL_TABLET | Freq: Three times a day (TID) | ORAL | 0 refills | Status: DC | PRN

## 2020-10-10 MED ORDER — ONDANSETRON HCL 4 MG/2ML IJ SOLN
INTRAMUSCULAR | Status: DC | PRN
  Administered 2020-10-10: 4 mg via INTRAVENOUS

## 2020-10-10 MED ORDER — TRAZODONE HCL 50 MG PO TABS
50.0000 mg | ORAL_TABLET | Freq: Every evening | ORAL | Status: DC | PRN
  Administered 2020-10-10: 50 mg via ORAL
  Filled 2020-10-10: qty 1

## 2020-10-10 MED ORDER — METOPROLOL TARTRATE 25 MG PO TABS
25.0000 mg | ORAL_TABLET | Freq: Two times a day (BID) | ORAL | Status: DC
  Administered 2020-10-10 – 2020-10-11 (×2): 25 mg via ORAL
  Filled 2020-10-10 (×2): qty 1

## 2020-10-10 MED ORDER — PROMETHAZINE HCL 25 MG/ML IJ SOLN
6.2500 mg | INTRAMUSCULAR | Status: DC | PRN
Start: 2020-10-10 — End: 2020-10-10

## 2020-10-10 MED ORDER — DULOXETINE HCL 60 MG PO CPEP: 60.0000 mg | ORAL_CAPSULE | Freq: Every day | ORAL | Status: DC

## 2020-10-10 MED ORDER — AMISULPRIDE (ANTIEMETIC) 5 MG/2ML IV SOLN
INTRAVENOUS | Status: DC | PRN
  Administered 2020-10-10: 5 mg via INTRAVENOUS

## 2020-10-10 MED ORDER — LIDOCAINE 2% (20 MG/ML) 5 ML SYRINGE
INTRAMUSCULAR | Status: DC | PRN
  Administered 2020-10-10: 1.5 mg/kg/h via INTRAVENOUS

## 2020-10-10 MED ORDER — SCOPOLAMINE 1 MG/3DAYS TD PT72
1.0000 | MEDICATED_PATCH | TRANSDERMAL | Status: DC
  Administered 2020-10-10: 1.5 mg via TRANSDERMAL
  Filled 2020-10-10: qty 1

## 2020-10-10 MED ORDER — KETAMINE HCL 10 MG/ML IJ SOLN
INTRAMUSCULAR | Status: AC
  Filled 2020-10-10: qty 1

## 2020-10-10 MED ORDER — PROPOFOL 500 MG/50ML IV EMUL
INTRAVENOUS | Status: AC
  Filled 2020-10-10: qty 50

## 2020-10-10 MED ORDER — PROPOFOL 500 MG/50ML IV EMUL
INTRAVENOUS | Status: DC | PRN
  Administered 2020-10-10: 25 ug/kg/min via INTRAVENOUS

## 2020-10-10 MED FILL — oxyCODONE HCL 5 MG TABS: 5 | 7 days supply | Qty: 20 | Fill #0

## 2020-10-10 SURGICAL SUPPLY — 45 items
ADH SKN CLS APL DERMABOND .7 (GAUZE/BANDAGES/DRESSINGS) ×1
APL PRP STRL LF DISP 70% ISPRP (MISCELLANEOUS) ×1
APL SKNCLS STERI-STRIP NONHPOA (GAUZE/BANDAGES/DRESSINGS) ×1
BENZOIN TINCTURE PRP APPL 2/3 (GAUZE/BANDAGES/DRESSINGS) ×2 IMPLANT
BINDER ABDOMINAL 12 ML 46-62 (SOFTGOODS) ×2 IMPLANT
CABLE HIGH FREQUENCY MONO STRZ (ELECTRODE) ×2 IMPLANT
CHLORAPREP W/TINT 26 (MISCELLANEOUS) ×2 IMPLANT
CLSR STERI-STRIP ANTIMIC 1/2X4 (GAUZE/BANDAGES/DRESSINGS) ×2 IMPLANT
COVER SURGICAL LIGHT HANDLE (MISCELLANEOUS) ×2 IMPLANT
COVER WAND RF STERILE (DRAPES) IMPLANT
DECANTER SPIKE VIAL GLASS SM (MISCELLANEOUS) IMPLANT
DERMABOND ADVANCED (GAUZE/BANDAGES/DRESSINGS) ×1
DERMABOND ADVANCED .7 DNX12 (GAUZE/BANDAGES/DRESSINGS) ×1 IMPLANT
DEVICE SECURE STRAP 25 ABSORB (INSTRUMENTS) IMPLANT
DRSG OPSITE POSTOP 4X10 (GAUZE/BANDAGES/DRESSINGS) ×2 IMPLANT
ELECT REM PT RETURN 15FT ADLT (MISCELLANEOUS) ×2 IMPLANT
EVACUATOR SILICONE 100CC (DRAIN) ×1 IMPLANT
GAUZE SPONGE 4X4 12PLY STRL (GAUZE/BANDAGES/DRESSINGS) ×1 IMPLANT
GLOVE SURG ENC MOIS LTX SZ6 (GLOVE) ×2 IMPLANT
GLOVE SURG UNDER LTX SZ6.5 (GLOVE) ×2 IMPLANT
GOWN STRL REUS W/TWL LRG LVL3 (GOWN DISPOSABLE) ×2 IMPLANT
GOWN STRL REUS W/TWL XL LVL3 (GOWN DISPOSABLE) ×4 IMPLANT
GRASPER SUT TROCAR 14GX15 (MISCELLANEOUS) ×2 IMPLANT
KIT BASIN OR (CUSTOM PROCEDURE TRAY) ×2 IMPLANT
KIT TURNOVER KIT A (KITS) ×2 IMPLANT
MARKER SKIN DUAL TIP RULER LAB (MISCELLANEOUS) ×2 IMPLANT
MESH ULTRAPRO 12X12 30X30CM (Mesh General) ×1 IMPLANT
PENCIL SMOKE EVACUATOR (MISCELLANEOUS) IMPLANT
SCISSORS LAP 5X35 DISP (ENDOMECHANICALS) ×2 IMPLANT
SET IRRIG TUBING LAPAROSCOPIC (IRRIGATION / IRRIGATOR) IMPLANT
SET TUBE SMOKE EVAC HIGH FLOW (TUBING) ×2 IMPLANT
SHEARS HARMONIC ACE PLUS 36CM (ENDOMECHANICALS) IMPLANT
SLEEVE XCEL OPT CAN 5 100 (ENDOMECHANICALS) ×2 IMPLANT
STRIP CLOSURE SKIN 1/2X4 (GAUZE/BANDAGES/DRESSINGS) ×2 IMPLANT
SUT ETHIBOND 0 (SUTURE) ×4 IMPLANT
SUT ETHILON 2 0 PS N (SUTURE) ×1 IMPLANT
SUT MNCRL AB 4-0 PS2 18 (SUTURE) ×4 IMPLANT
SUT PDS AB 0 CT1 36 (SUTURE) ×5 IMPLANT
SUT VIC AB 3-0 SH 27 (SUTURE) ×2
SUT VIC AB 3-0 SH 27XBRD (SUTURE) IMPLANT
TAPE CLOTH SOFT 2X10 (GAUZE/BANDAGES/DRESSINGS) ×2 IMPLANT
TOWEL OR 17X26 10 PK STRL BLUE (TOWEL DISPOSABLE) ×2 IMPLANT
TRAY LAPAROSCOPIC (CUSTOM PROCEDURE TRAY) ×2 IMPLANT
TROCAR BLADELESS OPT 5 100 (ENDOMECHANICALS) ×2 IMPLANT
TROCAR XCEL 12X100 BLDLESS (ENDOMECHANICALS) ×2 IMPLANT

## 2020-10-10 NOTE — H&P (Signed)
Surgical Evaluation  Chief Complaint: hernia  HPI: Very pleasant woman known to me, one year status post reversal of end colostomy after Hartman's in July 2020. She has been doing well with her GI function and in general, however has started to have some pain and a noticeable bulge in the mid upper abdomen. The pain is worse at the end of a long day of standing or with physical activity. Denies any obstructive symptoms.  No Known Allergies      Past Medical History:  Diagnosis Date  . Anxiety   . Back pain   . CAD (coronary artery disease)   . Chest pain   . Complication of anesthesia   . Constipation   . Coronary arteriosclerosis 05/04/2016  . Diverticulitis   . Diverticulosis   . Heart attack ([place]) 2010  . History of carpal tunnel syndrome    Bilateral  . History of kidney stones   . Hyperlipidemia   . Hypertension   . Intra-abdominal abscess ([place])   . Joint pain   . Migraines   . Myocardial infarction ([place]) 05/04/2009  . PONV (postoperative nausea and vomiting)   . Skin cancer    on nose  . Sleep apnea    wears CPAP nightly  . Vitamin D deficiency          Past Surgical History:  Procedure Laterality Date  . APPENDECTOMY    . APPLICATION OF WOUND VAC N/A 02/09/2019   Procedure: APPLICATION OF WOUND VAC;  Surgeon: Clovis Riley, MD;  Location: [facility];  Service: General;  Laterality: N/A;  . BACK SURGERY    . CARPAL TUNNEL RELEASE Right   . CARPAL TUNNEL RELEASE Left 08/13/2016   Procedure: LEFT CARPAL TUNNEL RELEASE;  Surgeon: Roseanne Kaufman, MD;  Location: [facility];  Service: Orthopedics;  Laterality: Left;  . COLECTOMY WITH COLOSTOMY CREATION/HARTMANN PROCEDURE N/A 02/09/2019   Procedure: HARTMANN PROCEDURE;  Surgeon: Clovis Riley, MD;  Location: [facility];  Service: General;  Laterality: N/A;  . COLONOSCOPY    . COLOSTOMY N/A 02/09/2019   Procedure: COLOSTOMY;  Surgeon: Clovis Riley, MD;  Location:  [facility];  Service: General;  Laterality: N/A;  . COLOSTOMY REVERSAL N/A 07/30/2019   Procedure: LAPAROSCOPIC ASSISTED REVERSAL OF END COLOSTOMY WITH RIGID PROCTOSCOPY;  Surgeon: Clovis Riley, MD;  Location: [facility];  Service: General;  Laterality: N/A;  . CORONARY STENT PLACEMENT     2 stents in the same artery  . ENDOMETRIAL ABLATION  2005  . EXTRACORPOREAL SHOCK WAVE LITHOTRIPSY Right 06/27/2017   Procedure: RIGHT EXTRACORPOREAL SHOCK WAVE LITHOTRIPSY (ESWL);  Surgeon: Franchot Gallo, MD;  Location: [facility];  Service: Urology;  Laterality: Right;  . KNEE ARTHROSCOPY WITH MENISCAL REPAIR Right   . MOHS SURGERY    . TUBAL LIGATION  1991         Family History  Problem Relation Age of Onset  . Hyperlipidemia Mother   . Hypertension Mother   . Cancer Father        skin  . Hyperlipidemia Father   . Dementia Father   . Arthritis Father   . Hypertension Father   . Sleep apnea Father   . Other Sister        Myelofibrosis  . Arthritis Sister   . Hyperlipidemia Sister   . Arthritis Sister   . Hyperlipidemia Sister   . Diabetes Paternal Grandfather   . Colon cancer Neg Hx     Social History  Socioeconomic History  . Marital status: Single    Spouse name: Not on file  . Number of children: Not on file  . Years of education: Not on file  . Highest education level: Not on file  Occupational History  . 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 Smoking status: Former Smoker    Packs/day: 1.00    Years: 20.00    Pack years: 20.00    Types: Cigarettes    Quit date: 2010    Years since quitting: 11.9  . Smokeless tobacco: Never Used  Vaping Use  . Vaping Use: Never used  Substance and Sexual Activity  . Alcohol use: Not Currently    Comment: Rare   . Drug use: No  . Sexual activity: Yes    Birth control/protection: Post-menopausal   Other Topics Concern  . Not on file  Social History Narrative   Caffeine daily    Social Determinants of Health   Financial Resource Strain: Not on file  Food Insecurity: Not on file  Transportation Needs: Not on file  Physical Activity: Not on file  Stress: Not on file  Social Connections: Not on file          Current Outpatient Medications on File Prior to Visit  Medication Sig Dispense Refill  . acetaminophen (TYLENOL) 500 MG tablet Take 500-1,000 mg by mouth every 6 (six) hours as needed for moderate pain.     [name] ALPRAZolam (XANAX) 0.25 MG tablet Take 1 tablet (0.25 mg total) by mouth daily as needed for anxiety. 30 tablet 0  . aspirin EC 81 MG EC tablet Take 1 tablet (81 mg total) by mouth daily.    . Coenzyme Q10 (CO Q10) 100 MG CAPS Take 1 capsule by mouth daily.    . cyanocobalamin (,VITAMIN B-12,) 1000 MCG/ML injection Inject 1 mL (1,000 mcg total) into the muscle every 30 (thirty) days. 10 mL 0  . DULoxetine (CYMBALTA) 30 MG capsule Take 2 capsules (60 mg total) by mouth daily. 180 capsule 0  . icosapent Ethyl (VASCEPA) 1 g capsule TAKE 2 CAPULES BY MOUTH TWICE DAILY 360 capsule 1  . Insulin Pen Needle (BD PEN NEEDLE MICRO U/F) 32G X 6 MM MISC UAD 100 each 3  . Liraglutide -Weight Management (SAXENDA) 18 MG/3ML SOPN Initiate at 1.2MG  mg per day for one week. In weekly intervals, increase the dose until a dose of 3 mg is reached. 1.2MG  then 1.8MG  then 2.4MG  then 3MG  12 mL 1  . losartan-hydrochlorothiazide (HYZAAR) 100-25 MG tablet Take 1 tablet by mouth daily. 90 tablet 0  . metoprolol tartrate (LOPRESSOR) 25 MG tablet TAKE 1 TABLET BY MOUTH TWO TIMES DAILY 180 tablet 0  . nitroGLYCERIN (NITROSTAT) 0.4 MG SL tablet 1 TABLET UNDER TONGUE EVERY 5 MINUTES UP TO 3 TIMES FOR CHEST PAIN THEN CALL DR IF NO RELIEF 25 tablet 3  . rosuvastatin (CRESTOR) 20 MG tablet Take one tablet by mouth 4 days per week 60 tablet 3  . traZODone (DESYREL) 50 MG tablet Take 1-2 tablets  (50-100 mg total) by mouth at bedtime as needed. for sleep 90 tablet 3  . varenicline (CHANTIX CONTINUING MONTH PAK) 1 MG tablet Take 1 tablet (1 mg total) by mouth 2 (two) times daily. 60 tablet 3  . varenicline (CHANTIX STARTING MONTH PAK) 0.5 MG X 11 & 1 MG X 42 tablet Take 0.5 mg tablet by mouth  once daily x3 days, then 0.5 mg tablet twice daily x4 days, then increase to one 1 mg tablet twice daily. 53 tablet 0            Current Facility-Administered Medications on File Prior to Visit  Medication Dose Route Frequency Provider Last Rate Last Admin  . cyanocobalamin ((VITAMIN B-12)) injection 1,000 mcg  1,000 mcg Intramuscular Q30 days Terald Sleeper, PA-C   1,000 mcg at 08/14/19 4196    Review of Systems: a complete, 10pt review of systems was completed with pertinent positives and negatives as documented in the HPI  Physical Exam: Vitals  Weight: 186.38 lb Height: 62in Body Surface Area: 1.86 m Body Mass Index: 34.09 kg/m  Temp.: 78F  Pulse: 86 (Regular)  P.OX: 94% (Room air) BP: 140/82(Sitting, Left Arm, Standard)  Alert and well-appearing, Unlabored respirations Abdomen soft, nondistended, nontender. There is a protrusion at the upper aspect of her midline incision between this and her colostomy site with a vasalva, fascial defect is not easily palpable. There is also a approximately 1 cm umbilical hernia defect without any contents.    CBC Latest Ref Rng & Units 11/01/2019 08/02/2019 08/01/2019  WBC 3.4 - 10.8 x10E3/uL 10.3 11.2(H) 13.3(H)  Hemoglobin 11.1 - 15.9 g/dL 15.4 11.7(L) 11.8(L)  Hematocrit 34.0 - 46.6 % 45.7 37.3 38.4  Platelets 150 - 450 x10E3/uL 231 152 159    CMP Latest Ref Rng & Units 11/01/2019 08/02/2019 08/01/2019  Glucose 65 - 99 mg/dL 97 95 94  BUN 6 - 24 mg/dL 15 13 13   Creatinine 0.57 - 1.00 mg/dL 0.69 0.48 0.55  Sodium 134 - 144 mmol/L 141 139 139  Potassium 3.5 - 5.2 mmol/L 4.1 3.9 4.0  Chloride 96 - 106 mmol/L 99 104 108   CO2 20 - 29 mmol/L 29 26 26   Calcium 8.7 - 10.2 mg/dL 9.8 8.1(L) 8.1(L)  Total Protein 6.0 - 8.5 g/dL 6.8 - -  Total Bilirubin 0.0 - 1.2 mg/dL <0.2 - -  Alkaline Phos 39 - 117 IU/L 81 - -  AST 0 - 40 IU/L 13 - -  ALT 0 - 32 IU/L 12 - -    Recent Labs       Lab Results  Component Value Date   INR 1.2 02/03/2019   INR 1.3 (H) 01/28/2019   INR 1.0 01/26/2019      Imaging: Imaging Results (Last 48 hours)  No results found.     A/P: INCISIONAL HERNIA (K43.2) Story: Not surprising given her previous course. We discussed the relevant anatomy and natural history of this. Options include observation which carries risk of increasing size or symptoms of that hernia and risk of incarceration, versus repair which I do recommend. We discussed the techniques of hernia repair in that I had recommended a laparoscopic approach with placement of mesh. She did have a 2 hour lysis of adhesions for her colostomy reversal so we discussed there is a possibility that she'll require a fair amount of adhesiolysis for this. Discussed risk of bleeding, infection, pain, scarring, bowel injury, mesh infection, fistula, ileus, hernia recurrence, as well as general risks including cardiovascular, thromboembolic or pulmonary. Questions were answered. She wishes to proceed with surgery.    Patient Active Problem List   Diagnosis Date Noted  . History of colostomy reversal 07/30/2019  . Cyst of ovary 02/20/2019  . Kidney stone 02/20/2019  . Migraine 02/20/2019  . Diverticulitis 01/27/2019  . Actinic keratoses 05/04/2017  . History of MI (myocardial infarction)  04/01/2017  . Prediabetes 09/28/2016  . Vitamin D deficiency 08/16/2016  . Recurrent major depressive disorder, in partial remission ([place]) 05/25/2016  . Diverticular disease 05/04/2016  . Malignant neoplasm of skin 05/04/2016  . Carpal tunnel syndrome 02/26/2016  . Obesity (BMI 30-39.9) 02/25/2016  . Insomnia 07/01/2015  . Essential  hypertension 05/21/2015  . Generalized anxiety disorder 06/06/2013  . CAD S/P percutaneous coronary angioplasty 09/13/2011  . Antiplatelet or antithrombotic long-term use 09/13/2011  . OSA on CPAP 09/13/2011  . Hyperlipidemia 09/13/2011       Romana Juniper, MD [facility]  See AMION to contact appropriate on-call provider

## 2020-10-10 NOTE — Discharge Instructions (Signed)
HERNIA REPAIR: POST OP INSTRUCTIONS   EAT Gradually transition to a high fiber diet with a fiber supplement over the next few weeks after discharge.  Start with a pureed / full liquid diet (see below)  WALK Walk an hour a day (cumulative- not all at once).  Control your pain to do that.    CONTROL PAIN Control pain so that you can walk, sleep, tolerate sneezing/coughing, and go up/down stairs.  HAVE A BOWEL MOVEMENT DAILY Keep your bowels regular to avoid problems.  OK to try a laxative to override constipation.  OK to use an antidairrheal to slow down diarrhea.  Call if not better after 2 tries  CALL IF YOU HAVE PROBLEMS/CONCERNS Call if you are still struggling despite following these instructions. Call if you have concerns not answered by these instructions  ######################################################################    1. DIET: Follow a light bland diet & liquids the first 24 hours after arrival home, such as soup, liquids, starches, etc.  Be sure to drink plenty of fluids.  Quickly advance to a usual solid diet within a few days.  Avoid fast food or heavy meals as your are more likely to get nauseated or have irregular bowels.  A low-sugar, high-fiber diet for the rest of your life is ideal.   2. Take your usually prescribed home medications unless otherwise directed.  3. PAIN CONTROL: a. Pain is best controlled by a usual combination of three different methods TOGETHER: i. Ice/Heat ii. Over the counter pain medication iii. Prescription pain medication b. Most patients will experience some swelling and bruising around the hernia(s) such as the bellybutton, groins, or old incisions.  Ice packs or heating pads (30-60 minutes up to 6 times a day) will help. Use ice for the first few days to help decrease swelling and bruising, then switch to heat to help relax tight/sore spots and speed recovery.  Some people prefer to use ice alone, heat alone, alternating between ice &  heat.  Experiment to what works for you.  Swelling and bruising can take several weeks to resolve.   c. It is helpful to take an over-the-counter pain medication regularly for the first days: i. Naproxen (Aleve, etc)  Two 220mg tabs twice a day OR Ibuprofen (Advil, etc) Three 200mg tabs four times a day (every meal & bedtime) AND ii. Acetaminophen (Tylenol, etc) 325-650mg four times a day (every meal & bedtime) d. A  prescription for pain medication should be given to you upon discharge.  Take your pain medication as prescribed, IF NEEDED.  i. If you are having problems/concerns with the prescription medicine (does not control pain, nausea, vomiting, rash, itching, etc), please call us [phone] to see if we need to switch you to a different pain medicine that will work better for you and/or control your side effect better. ii. If you need a refill on your pain medication, please contact your pharmacy.  They will contact our office to request authorization. Prescriptions will not be filled after 5 pm or on week-ends.  4. Avoid getting constipated.  Between the surgery and the pain medications, it is common to experience some constipation.  Increasing fluid intake and taking a fiber supplement (such as Metamucil, Citrucel, FiberCon, MiraLax, etc) 1-2 times a day regularly will usually help prevent this problem from occurring.  A mild laxative (prune juice, Milk of Magnesia, MiraLax, etc) should be taken according to package directions if there are no bowel movements after 48 hours.    5.   Wash / shower every day, starting 2 days after surgery.  You may shower over the clear dressing and steri strips which are waterproof.    6. Remove your clear outer bandage 5 days after surgery.  Band-Aids can be removed in 2 days.  Steri-Strips will peel off on their own after about 1 to 2 weeks.  You may leave the incision open to air.  You may replace a dressing/Band-Aid to cover an incision for comfort if you  wish.  Continue to shower over incision(s) after the dressing is off. 7. Keep a log of your drain output each day and bring this to your follow-up appointments. 8. Wear your abdominal binder at all times for the first week after surgery. After this, wear it for comfort when up and moving around.  9. ACTIVITIES as tolerated:   a. You may resume regular (light) daily activities beginning the next day--such as daily self-care, walking, climbing stairs--gradually increasing activities as tolerated.  Control your pain so that you can walk an hour a day.  If you can walk 30 minutes without difficulty, it is safe to try more intense activity such as jogging, treadmill, bicycling, low-impact aerobics, swimming, etc. b. Refrain from the most intensive and strenuous activity such as sit-ups, heavy lifting, contact sports, etc  Refrain from any heavy lifting or straining until 6 weeks after surgery.   c. DO NOT PUSH THROUGH PAIN.  Let pain be your guide: If it hurts to do something, don't do it.  Pain is your body warning you to avoid that activity for another week until the pain goes down. d. You may drive when you are no longer taking prescription pain medication, you can comfortably wear a seatbelt, and you can safely maneuver your car and apply brakes. e. Dennis Bast may have sexual intercourse when it is comfortable.   10. FOLLOW UP in our office-we will call you to schedule a drain check for late next week. a. Please call [facility] at [phone] to set up an appointment to see your surgeon in the office for a follow-up appointment approximately 2-3 weeks after your surgery. b. Make sure that you call for this appointment the day you arrive home to insure a convenient appointment time.  9.  If you have disability of FMLA / Family leave forms, please bring the forms to the office for processing.  (do not give to your surgeon).  WHEN TO CALL us [phone]: 1. Poor pain control 2. Reactions / problems with new  medications (rash/itching, nausea, etc)  3. Fever over 101.5 F (38.5 C) 4. Inability to urinate 5. Nausea and/or vomiting 6. Worsening swelling or bruising 7. Continued bleeding from incision. 8. Increased pain, redness, or drainage from the incision   The clinic staff is available to answer your questions during regular business hours (8:30am-5pm).  Please don't hesitate to call and ask to speak to one of our nurses for clinical concerns.   If you have a medical emergency, go to the nearest emergency room or call 911.  A surgeon from [facility] is always on call at the hospitals in [facility], [place], [place], [place], [place]  [address] ?  [address] [address], [place], [place]   [address] MAIN: [phone] ? TOLL FREE: [phone] ? FAX: [phone] [email or web address]    Surgical [facility] Surgical drains are used to remove extra fluid that normally builds up in a surgical wound after surgery.  A surgical drain helps to heal a surgical wound. Different kinds of surgical drains include:  Active drains. These drains use suction to pull drainage away from the surgical wound. Drainage flows through a tube to a container outside of the body. With these drains, you need to keep the bulb or the drainage container flat (compressed) at all times, except while you empty it. Flattening the bulb or container creates suction.  Passive drains. These drains allow fluid to drain naturally, by gravity. Drainage flows through a tube to a bandage (dressing) or a container outside of the body. Passive drains do not need to be emptied. A drain is placed during surgery. Right after surgery, drainage is usually bright red and a little thicker than water. The drainage may gradually turn yellow or pink and become thinner. It is likely that your health care provider will remove the drain when the drainage stops or when the amount decreases to  1-2 Tbsp (15-30 mL) during a 24-hour period. Supplies needed:  Tape.  Germ-free cleaning solution (sterile saline).  Cotton swabs.  Split gauze drain sponge: 4 x 4 inches (10 x 10 cm).  Gauze square: 4 x 4 inches (10 x 10 cm). How to care for your surgical drain Care for your drain as told by your health care provider. This is important to help prevent infection. If your drain is placed at your back, or any other hard-to-reach area, ask another person to assist you in performing the following tasks: General care  Keep the skin around the drain dry and covered with a dressing at all times.  Check your drain area every day for signs of infection. Check for: ? Redness, swelling, or pain. ? Pus or a bad smell. ? Cloudy drainage. ? Tenderness or pressure at the drain exit site. Changing the dressing Follow instructions from your health care provider about how to change your dressing. Change your dressing at least once a day. Change it more often if needed to keep the dressing dry. Make sure you: 1. Gather your supplies. 2. Wash your hands with soap and water before you change your dressing. If soap and water are not available, use hand sanitizer. 3. Remove the old dressing. Avoid using scissors to do that. 4. Wash your hands with soap and water again after removing the old dressing. 5. Use sterile saline to clean your skin around the drain. You may need to use a cotton swab to clean the skin. 6. Place the tube through the slit in a drain sponge. Place the drain sponge so that it covers your wound. 7. Place the gauze square or another drain sponge on top of the drain sponge that is on the wound. Make sure the tube is between those layers. 8. Tape the dressing to your skin. 9. Tape the drainage tube to your skin 1-2 inches (2.5-5 cm) below the place where the tube enters your body. Taping keeps the tube from pulling on any stitches (sutures) that you have. 10. Wash your hands with soap and  water. 11. Write down the color of your drainage and how often you change your dressing. How to empty your active drain 1. Make sure that you have a measuring cup that you can empty your drainage into. 2. Wash your hands with soap and water. If soap and water are not available, use hand sanitizer. 3. Loosen any pins or clips that hold the tube in place. 4. If your health care provider tells you to strip the  tube to prevent clots and tube blockages: ? Hold the tube at the skin with one hand. Use your other hand to pinch the tubing with your thumb and first finger. ? Gently move your fingers down the tube while squeezing very lightly. This clears any drainage, clots, or tissue from the tube. ? You may need to do this several times each day to keep the tube clear. Do not pull on the tube. 5. Open the bulb cap or the drain plug. Do not touch the inside of the cap or the bottom of the plug. 6. Turn the device upside down and gently squeeze. 7. Empty all of the drainage into the measuring cup. 8. Compress the bulb or the container and replace the cap or the plug. To compress the bulb or the container, squeeze it firmly in the middle while you close the cap or plug the container. 9. Write down the amount of drainage that you have in each 24-hour period. If you have less than 2 Tbsp (30 mL) of drainage during 24 hours, contact your health care provider. 10. Flush the drainage down the toilet. 11. Wash your hands with soap and water.   Contact a health care provider if:  You have redness, swelling, or pain around your drain area.  You have pus or a bad smell coming from your drain area.  You have a fever or chills.  The skin around your drain is warm to the touch.  The amount of drainage that you have is increasing instead of decreasing.  You have drainage that is cloudy.  There is a sudden stop or a sudden decrease in the amount of drainage that you have.  Your drain tube falls out.  Your  active drain does not stay compressed after you empty it. Summary  Surgical drains are used to remove extra fluid that normally builds up in a surgical wound after surgery.  Different kinds of surgical drains include active drains and passive drains. Active drains use suction to pull drainage away from the surgical wound, and passive drains allow fluid to drain naturally.  It is important to care for your drain to prevent infection. If your drain is placed at your back, or any other hard-to-reach area, ask another person to assist you.  Contact your health care provider if you have redness, swelling, or pain around your drain area. This information is not intended to replace advice given to you by your health care provider. Make sure you discuss any questions you have with your health care provider. Document Revised: 08/23/2018 Document Reviewed: 08/23/2018 Elsevier Patient Education  2021 [employer].

## 2020-10-10 NOTE — Transfer of Care (Signed)
Immediate Anesthesia Transfer of Care Note  Patient: Heather Lam  Procedure(s) Performed: LAPAROSCOPIC  WITH TRASITION TO OPEN INCISIONAL HERNIA REPAIR WITH MESH (N/A Abdomen)  Patient Location: PACU  Anesthesia Type:General  Level of Consciousness: drowsy and responds to stimulation  Airway & Oxygen Therapy: Patient Spontanous Breathing and Patient connected to face mask oxygen  Post-op Assessment: Report given to RN and Post -op Vital signs reviewed and stable  Post vital signs: Reviewed and stable  Last Vitals:  Vitals Value Taken Time  BP 142/73 10/10/20 0957  Temp    Pulse 74 10/10/20 1000  Resp 15 10/10/20 1000  SpO2 98 % 10/10/20 1000  Vitals shown include unvalidated device data.  Last Pain:  Vitals:   10/10/20 0559  TempSrc: Oral  PainSc: 0-No pain         Complications: No complications documented.

## 2020-10-10 NOTE — Progress Notes (Signed)
Pt reports that she was COVID positive at the end of January and was not required to have a current COVID test. Will confirm w CN/AC.

## 2020-10-10 NOTE — Anesthesia Postprocedure Evaluation (Signed)
Anesthesia Post Note  Patient: Heather Lam  Procedure(s) Performed: LAPAROSCOPIC  WITH TRASITION TO OPEN INCISIONAL HERNIA REPAIR WITH MESH (N/A Abdomen)     Patient location during evaluation: PACU Anesthesia Type: General Level of consciousness: awake Pain management: pain level controlled Vital Signs Assessment: post-procedure vital signs reviewed and stable Respiratory status: spontaneous breathing, nonlabored ventilation, respiratory function stable and patient connected to nasal cannula oxygen Cardiovascular status: blood pressure returned to baseline and stable Postop Assessment: no apparent nausea or vomiting Anesthetic complications: no   No complications documented.  Last Vitals:  Vitals:   10/10/20 1806 10/10/20 1924  BP: (!) 142/76 (!) 122/50  Pulse: 84   Resp: 17   Temp: 37.1 C   SpO2: 92%     Last Pain:  Vitals:   10/10/20 1923  TempSrc:   PainSc: 0-No pain                 Arlie Riker P Adriyanna Christians

## 2020-10-10 NOTE — Anesthesia Procedure Notes (Signed)
Procedure Name: Intubation Performed by: West Pugh, CRNA Pre-anesthesia Checklist: Patient identified, Emergency Drugs available, Suction available, Patient being monitored and Timeout performed Patient Re-evaluated:Patient Re-evaluated prior to induction Oxygen Delivery Method: Circle system utilized Preoxygenation: Pre-oxygenation with 100% oxygen Induction Type: IV induction Ventilation: Mask ventilation without difficulty Laryngoscope Size: Mac and 3 Grade View: Grade II Tube type: Oral Tube size: 7.0 mm Number of attempts: 1 Airway Equipment and Method: Stylet Placement Confirmation: ETT inserted through vocal cords under direct vision,  positive ETCO2,  CO2 detector and breath sounds checked- equal and bilateral Secured at: 21 cm Tube secured with: Tape Dental Injury: Teeth and Oropharynx as per pre-operative assessment

## 2020-10-10 NOTE — Op Note (Addendum)
Patient: Heather Lam (August 12, 1960, [id number])  Date of Surgery: 10/10/2020   Preoperative Diagnosis: INCISIONAL HERNIA   Postoperative Diagnosis: INCISIONAL HERNIA   Surgical Procedure: Laparoscopic lysis of adhesions x 20 minutes with transition to open retromuscular repair of multiple ventral incisional hernia defects Hernia Location: Ventral hernia location: Epigastric (M2), Umbilical (M3) and Infraumbilical (M4) Hernia Size:  15 x 6 cm  Mesh Size &Type:  25 x 15 cm Ultrapro Mesh Position: Sublay - Retromuscular Myofascial Releases: Bilateral Myofascial Release: Posterior rectus myofascial release    Operative Team Members:  Surgeon(s) and Role:    * Clovis Riley, MD - Primary    * Stechschulte, Nickola Major, MD - Assisting   Anesthesiologist: Murvin Natal, MD CRNA: Lavina Hamman, CRNA; West Pugh, CRNA   Anesthesia: General   Fluids:  Total I/O In: 1100 [I.V.:1000; IV [id number]:[id number]] Out: -   Complications: none  Drains:  19 Fr. Blake drain in the retromuscular position  Specimen: None  Disposition:  PACU - hemodynamically stable.  Plan of Care: Admit for overnight observation  Indications for Procedure: Heather Lam is a 60 y.o. female who presented with symptomatic incisional hernia  Findings:  Hernia Location: Ventral hernia location: Epigastric (M2), Umbilical (M3) and Infraumbilical (M4) Hernia Size:  15 x 6 cm  Mesh Size &Type:  25 x 15 cm Ultrapro Mesh Position: Sublay - Retromuscular Myofascial Releases: Bilateral Myofascial Release: Posterior rectus myofascial release   Description of Procedure:  The patient was positioned supine on the operating room table, adequately padded and secured.  General endotracheal anesthesia was initiated.  A timeout procedure was performed.  The abdomen was prepped and draped in usual sterile fashion.  Peritoneal access was gained using optical entry in the left upper quadrant.  Insufflation to 15 mmHg  was without incident.  Omental adhesions were noted to the anterior abdominal wall.  Under direct visualization 2 additional left-sided 5 mm trochars were placed.  Adhesiolysis proceeded with a combination of sharp dissection and harmonic scalpel until the abdominal wall was cleared.  Once this was complete, she was noted to have actually have multiple hernia defects, a small defect at the prior stoma site and then multiple large defects along the midline, the largest approximately 5 cm in diameter and the combination of defects extending length of 15 cm cephalocaudad.   A midline incision was made and dissection was carried down through subcutaneous tissue. The prior scar was excised, completely. The abdomen was entered safely as all adhesions had been taken down laparoscopically.  The hernia sac tissue was removed and small bridges of fascia between the multiple defects were excised.  After the anterior abdominal wall was cleared off of all adhesions, a large safety towel was placed over the viscera to protect them. The total hernia defect area was measured and the size was recorded above under findings.  A rectus myofascial release was performed on the RIGHT and LEFT side. The posterior rectus sheath was incised just lateral to the hernia edge.  This incision in the posterior rectus sheath was extended both cephalad and caudal to the hernia defects.  Dissection was carried out laterally in the retromuscular plane to the edge of the rectus sheath progressively disconnecting the rectus muscle from the underlying posterior rectus sheath.  The rectus myofascial release accomplished medialization of the posterior rectus sheath towards the midline and disinsertion of the rectus muscle from its surrounding fascia, and thus its encasement in the rectus sheath, allowing for  widening of the rectus muscle and transfer of the rectus flap towards the midline.  This will allow for future inset of the medial aspect of the  flap for abdominal wall reconstruction.   The towel was removed and the posterior fascia was reapproximated in the midline with a continuous 0 PDS suture. There was a small defect where the prior stoma site hernia was and this was closed with a figure of 8 0 PDS as well. The retromuscular plane was copiously irrigated with warm normal saline. There was good hemostasis of the operative field.   A transversus abdominis plane (TAP) block was performed bilaterally with Exparel.  The anesthetic was injected into the plane between the transversus abdominis and internal abdominal oblique muscle bilaterally.  The mesh listed above was brought into the operative field and cut to the size specified above. The mesh was deployed into the retromuscular position where it conformed well to the space.  It did not require any fixation.  The mesh space was irrigated with saline.  One 19 [place] Blake drain was placed in the retromuscular position and secured at the skin with 2-0 nylon. The anterior fascia and rectus muscles were re approximated in the midline utilizing a continuous 0 PDS suture. The fascia came together well. On completion the abdomen was briefly reinsufflated and the closure inspected- the posterior layer is intact with no exposed mesh or entrapped structures. The peritoneal cavity is inspected and confirmed hemostatic. The abdomen is then desufflated and the trocars removed. The subcutaneous tissue was irrigated with saline. Scarpa's fascia was reapproximated with interrupted 3-0 Vicryl suture.  The subcutaneous tissue was reapproximated with interrupted 3-0 vicryls.  The skin was closed with running 4-0 Monocryl subcuticular suture. Benzoin, steri strips and sterile dressings were applied.  All sponge and needle counts were correct at the end of this case.   Clovis Riley MD FACS General, Bariatric, & Minimally Invasive Surgery [facility], [place]

## 2020-10-11 ENCOUNTER — Other Ambulatory Visit (HOSPITAL_COMMUNITY): Payer: Self-pay | Admitting: Surgery

## 2020-10-11 DIAGNOSIS — K432 Incisional hernia without obstruction or gangrene: Secondary | ICD-10-CM | POA: Diagnosis not present

## 2020-10-11 LAB — CBC
HCT: 38.8 % (ref 36.0–46.0)
Hemoglobin: 12.8 g/dL (ref 12.0–15.0)
MCH: 29.5 pg (ref 26.0–34.0)
MCHC: 33 g/dL (ref 30.0–36.0)
MCV: 89.4 fL (ref 80.0–100.0)
Platelets: 181 10*3/uL (ref 150–400)
RBC: 4.34 MIL/uL (ref 3.87–5.11)
RDW: 13.6 % (ref 11.5–15.5)
WBC: 14.9 10*3/uL — ABNORMAL HIGH (ref 4.0–10.5)
nRBC: 0 % (ref 0.0–0.2)

## 2020-10-11 LAB — BASIC METABOLIC PANEL
Anion gap: 9 (ref 5–15)
BUN: 14 mg/dL (ref 6–20)
CO2: 28 mmol/L (ref 22–32)
Calcium: 8.4 mg/dL — ABNORMAL LOW (ref 8.9–10.3)
Chloride: 101 mmol/L (ref 98–111)
Creatinine, Ser: 0.64 mg/dL (ref 0.44–1.00)
GFR, Estimated: >60 mL/min (ref >60)
Glucose, Bld: 102 mg/dL — ABNORMAL HIGH (ref 70–99)
Potassium: 3.7 mmol/L (ref 3.5–5.1)
Sodium: 138 mmol/L (ref 135–145)

## 2020-10-11 LAB — GLUCOSE, CAPILLARY: Glucose-Capillary: 96 mg/dL (ref 70–99)

## 2020-10-11 LAB — MAGNESIUM: Magnesium: 1.7 mg/dL (ref 1.7–2.4)

## 2020-10-11 MED ORDER — GABAPENTIN 300 MG PO CAPS
300.0000 mg | ORAL_CAPSULE | Freq: Three times a day (TID) | ORAL | 1 refills | Status: DC | PRN
Start: 2020-10-11 — End: 2020-10-22

## 2020-10-11 MED FILL — GABAPENTIN 300 MG CAPSULE: 300 | 6 days supply | Qty: 20 | Fill #0

## 2020-10-11 NOTE — Plan of Care (Signed)
Reviewed written d/c instructions w pt and all questions answered. Reviewed/demonstrated how to empty JP drain, how to reapply suction to it and need to keep a log of JP output for her MD visits. Pt verb understanding of above. D/c per w/c w all of jer belongings in stable condition.

## 2020-10-20 ENCOUNTER — Encounter (HOSPITAL_COMMUNITY): Payer: Self-pay | Admitting: Surgery

## 2020-10-22 ENCOUNTER — Other Ambulatory Visit: Payer: Self-pay

## 2020-10-22 ENCOUNTER — Ambulatory Visit (INDEPENDENT_AMBULATORY_CARE_PROVIDER_SITE_OTHER): Payer: No Typology Code available for payment source | Admitting: Nurse Practitioner

## 2020-10-22 ENCOUNTER — Encounter: Payer: Self-pay | Admitting: Nurse Practitioner

## 2020-10-22 VITALS — BP 133/68 | HR 67 | Temp 97.7°F | Resp 20 | Ht 62.0 in | Wt 178.0 lb

## 2020-10-22 DIAGNOSIS — T8130XA Disruption of wound, unspecified, initial encounter: Secondary | ICD-10-CM

## 2020-10-22 DIAGNOSIS — L02229 Furuncle of trunk, unspecified: Secondary | ICD-10-CM | POA: Diagnosis not present

## 2020-10-22 NOTE — Progress Notes (Signed)
   Subjective:    Patient ID: Heather Lam, female    DOB: 1961/06/12, 60 y.o.   MRN: [id number]   Chief Complaint: swollen area in groin   HPI Patient comes in with a furnuncle on left side of pubis. She has been on bactrim for 9 days and area has gotten bigger and is sore to the touch.   Review of Systems  Genitourinary: Negative for enuresis.  Skin: Positive for wound.       Objective:   Physical Exam Vitals and nursing note reviewed.  Constitutional:      Appearance: Normal appearance.  Cardiovascular:     Pulses: Normal pulses.     Heart sounds: Normal heart sounds.  Pulmonary:     Breath sounds: Normal breath sounds.  Skin:    Comments: Quarter size erythemous ender to touch lesion left side of pubis moderate apmont of copius excudate squeezed out. Abdominal incison had dehisced at the umbilicus. No excudate noted  Neurological:     Mental Status: She is alert.    BP 133/68   Pulse 67   Temp 97.7 F (36.5 C) (Temporal)   Resp 20   Ht 5\' 2"  (1.575 m)   Wt 178 lb (80.7 kg)   LMP 12/31/2013   BMI 32.56 kg/m        Assessment & Plan:  Heather Lam in today with chief complaint of swollen area in groin   1. Furuncle of pubic region Change antibiotic from bactrim to keflex Warm compresses daily  2. Wound dehiscence Contact surgeon about abdominal wound.    The above assessment and management plan was discussed with the patient. The patient verbalized understanding of and has agreed to the management plan. Patient is aware to call the clinic if symptoms persist or worsen. Patient is aware when to return to the clinic for a follow-up visit. Patient educated on when it is appropriate to go to the emergency department.   Mary-Margaret Hassell Done, FNP

## 2020-10-24 ENCOUNTER — Other Ambulatory Visit (HOSPITAL_BASED_OUTPATIENT_CLINIC_OR_DEPARTMENT_OTHER): Payer: Self-pay

## 2020-10-24 MED ORDER — CEPHALEXIN 500 MG PO CAPS: 500.0000 mg | ORAL_CAPSULE | Freq: Three times a day (TID) | ORAL | 0 refills | Status: DC

## 2020-10-24 NOTE — Addendum Note (Signed)
Addended by: Chevis Pretty on: 10/24/2020 12:41 PM   Modules accepted: Orders

## 2020-10-27 MED FILL — ROSUVASTATIN CALCIUM 20 MG: 20 | 84 days supply | Qty: 48 | Fill #3

## 2020-10-27 MED FILL — DULoxetine HCL 30 MG CPEP: 30 | 90 days supply | Qty: 180 | Fill #0

## 2020-11-03 MED FILL — Losartan Potassium & Hydrochlorothiazide Tab 100-25 MG: ORAL | 90 days supply | Qty: 90 | Fill #0 | Status: AC

## 2020-11-04 ENCOUNTER — Other Ambulatory Visit (HOSPITAL_COMMUNITY): Payer: Self-pay

## 2020-11-06 ENCOUNTER — Other Ambulatory Visit (HOSPITAL_COMMUNITY): Payer: Self-pay

## 2020-11-10 ENCOUNTER — Ambulatory Visit: Payer: No Typology Code available for payment source | Admitting: Interventional Cardiology

## 2020-11-11 ENCOUNTER — Other Ambulatory Visit (HOSPITAL_COMMUNITY): Payer: Self-pay

## 2020-11-12 ENCOUNTER — Other Ambulatory Visit (HOSPITAL_COMMUNITY): Payer: Self-pay

## 2020-11-12 ENCOUNTER — Other Ambulatory Visit: Payer: No Typology Code available for payment source

## 2020-11-12 ENCOUNTER — Other Ambulatory Visit: Payer: Self-pay | Admitting: Family Medicine

## 2020-11-12 DIAGNOSIS — Z9861 Coronary angioplasty status: Secondary | ICD-10-CM

## 2020-11-12 DIAGNOSIS — Z720 Tobacco use: Secondary | ICD-10-CM

## 2020-11-12 DIAGNOSIS — R7303 Prediabetes: Secondary | ICD-10-CM

## 2020-11-12 DIAGNOSIS — I2510 Atherosclerotic heart disease of native coronary artery without angina pectoris: Secondary | ICD-10-CM

## 2020-11-12 DIAGNOSIS — E559 Vitamin D deficiency, unspecified: Secondary | ICD-10-CM

## 2020-11-12 LAB — BAYER DCA HB A1C WAIVED: HB A1C (BAYER DCA - WAIVED): 5.6 % (ref <7.0)

## 2020-11-13 ENCOUNTER — Ambulatory Visit (INDEPENDENT_AMBULATORY_CARE_PROVIDER_SITE_OTHER): Payer: No Typology Code available for payment source | Admitting: Interventional Cardiology

## 2020-11-13 ENCOUNTER — Other Ambulatory Visit: Payer: Self-pay

## 2020-11-13 ENCOUNTER — Encounter: Payer: Self-pay | Admitting: Interventional Cardiology

## 2020-11-13 VITALS — BP 140/60 | HR 68 | Ht 62.0 in | Wt 184.0 lb

## 2020-11-13 DIAGNOSIS — Z9989 Dependence on other enabling machines and devices: Secondary | ICD-10-CM

## 2020-11-13 DIAGNOSIS — I2510 Atherosclerotic heart disease of native coronary artery without angina pectoris: Secondary | ICD-10-CM | POA: Diagnosis not present

## 2020-11-13 DIAGNOSIS — Z9861 Coronary angioplasty status: Secondary | ICD-10-CM

## 2020-11-13 DIAGNOSIS — G4733 Obstructive sleep apnea (adult) (pediatric): Secondary | ICD-10-CM

## 2020-11-13 DIAGNOSIS — E782 Mixed hyperlipidemia: Secondary | ICD-10-CM

## 2020-11-13 DIAGNOSIS — I10 Essential (primary) hypertension: Secondary | ICD-10-CM | POA: Diagnosis not present

## 2020-11-13 LAB — CBC
Hematocrit: 44.4 % (ref 34.0–46.6)
Hemoglobin: 14.7 g/dL (ref 11.1–15.9)
MCH: 28.5 pg (ref 26.6–33.0)
MCHC: 33.1 g/dL (ref 31.5–35.7)
MCV: 86 fL (ref 79–97)
Platelets: 264 10*3/uL (ref 150–450)
RBC: 5.16 x10E6/uL (ref 3.77–5.28)
RDW: 13.4 % (ref 11.7–15.4)
WBC: 10 10*3/uL (ref 3.4–10.8)

## 2020-11-13 LAB — LIPID PANEL
Chol/HDL Ratio: 4.2 ratio (ref 0.0–4.4)
Cholesterol, Total: 157 mg/dL (ref 100–199)
HDL: 37 mg/dL — ABNORMAL LOW (ref >39)
LDL Chol Calc (NIH): 84 mg/dL (ref 0–99)
Triglycerides: 212 mg/dL — ABNORMAL HIGH (ref 0–149)
VLDL Cholesterol Cal: 36 mg/dL (ref 5–40)

## 2020-11-13 LAB — CMP14+EGFR
ALT: 13 IU/L (ref 0–32)
AST: 15 IU/L (ref 0–40)
Albumin/Globulin Ratio: 1.3 (ref 1.2–2.2)
Albumin: 3.8 g/dL (ref 3.8–4.9)
Alkaline Phosphatase: 79 IU/L (ref 44–121)
BUN/Creatinine Ratio: 20 (ref 12–28)
BUN: 13 mg/dL (ref 8–27)
Bilirubin Total: <0.2 mg/dL (ref 0.0–1.2)
CO2: 22 mmol/L (ref 20–29)
Calcium: 10.1 mg/dL (ref 8.7–10.3)
Chloride: 100 mmol/L (ref 96–106)
Creatinine, Ser: 0.66 mg/dL (ref 0.57–1.00)
Globulin, Total: 2.9 g/dL (ref 1.5–4.5)
Glucose: 95 mg/dL (ref 65–99)
Potassium: 4 mmol/L (ref 3.5–5.2)
Sodium: 142 mmol/L (ref 134–144)
Total Protein: 6.7 g/dL (ref 6.0–8.5)
eGFR: 100 mL/min/{1.73_m2} (ref >59)

## 2020-11-13 LAB — VITAMIN D 25 HYDROXY (VIT D DEFICIENCY, FRACTURES): Vit D, 25-Hydroxy: 39.3 ng/mL (ref 30.0–100.0)

## 2020-11-13 LAB — TSH: TSH: 1.99 u[IU]/mL (ref 0.450–4.500)

## 2020-11-13 NOTE — Progress Notes (Signed)
Cardiology Office Note:    Date:  11/13/2020   ID:  Heather Lam, DOB 10-19-1960, MRN [id number]  PCP:  [name], DO  Cardiologist:  Sinclair Grooms, MD   Referring MD: [name], DO   Chief Complaint  Patient presents with  . Coronary Artery Disease    History of Present Illness:    Heather Lam is a 60 y.o. female with a hx of coronary artery disease with prior acute inferior infarction 2010stent to RCAand intermediate stenosis in the mid LADtreated medically.2012 nuclear study negative for ischemia. Other problems include hyperlipidemia, hypertension, and anxiety disorder. nuc study in 2016 with EF 74% normal study. recentlipids LDL 51. TG at 183.  Quite physically active without symptoms. He is doing okay.  She has not had angina.  She climbed hanging rock and did not have angina.  She is working.  She got a divorce and started smoking again.  She stopped taking Vascepa because of the quantity and size of pills.  She denies claudication, transient neurological symptoms, nitroglycerin use.  She is compliant with the medications that she takes.  Past Medical History:  Diagnosis Date  . Anxiety   . Back pain   . CAD (coronary artery disease)   . Chest pain   . Complication of anesthesia   . Constipation   . Coronary arteriosclerosis 05/04/2016  . Diverticulitis   . Diverticulosis   . Family history of adverse reaction to anesthesia    Mom PONV  . Heart attack ([place]) 2010  . History of carpal tunnel syndrome    Bilateral  . History of kidney stones   . Hyperlipidemia   . Hypertension   . Intra-abdominal abscess ([place])   . Joint pain   . Migraines   . Morbid obesity ([place])   . Myocardial infarction ([place]) 05/04/2009  . PONV (postoperative nausea and vomiting)   . Pre-diabetes    pt. denies . It is noted in pt. chart  . Skin cancer    on nose  . Sleep apnea    not using cpap at this time  . Vitamin D deficiency     Past Surgical  History:  Procedure Laterality Date  . APPENDECTOMY    . APPLICATION OF WOUND VAC N/A 02/09/2019   Procedure: APPLICATION OF WOUND VAC;  Surgeon: Clovis Riley, MD;  Location: [place];  Service: General;  Laterality: N/A;  . BACK SURGERY    . CARPAL TUNNEL RELEASE Right   . CARPAL TUNNEL RELEASE Left 08/13/2016   Procedure: LEFT CARPAL TUNNEL RELEASE;  Surgeon: Roseanne Kaufman, MD;  Location: [place];  Service: Orthopedics;  Laterality: Left;  . COLECTOMY WITH COLOSTOMY CREATION/HARTMANN PROCEDURE N/A 02/09/2019   Procedure: HARTMANN PROCEDURE;  Surgeon: Clovis Riley, MD;  Location: [place];  Service: General;  Laterality: N/A;  . COLONOSCOPY    . COLOSTOMY N/A 02/09/2019   Procedure: COLOSTOMY;  Surgeon: Clovis Riley, MD;  Location: [place];  Service: General;  Laterality: N/A;  . COLOSTOMY REVERSAL N/A 07/30/2019   Procedure: LAPAROSCOPIC ASSISTED REVERSAL OF END COLOSTOMY WITH RIGID PROCTOSCOPY;  Surgeon: Clovis Riley, MD;  Location: [facility];  Service: General;  Laterality: N/A;  . CORONARY STENT PLACEMENT     2 stents in the same artery  . ENDOMETRIAL ABLATION  2005  . EXTRACORPOREAL SHOCK WAVE LITHOTRIPSY Right 06/27/2017   Procedure: RIGHT EXTRACORPOREAL SHOCK WAVE LITHOTRIPSY (ESWL);  Surgeon: Franchot Gallo, MD;  Location:  [facility];  Service: Urology;  Laterality: Right;  . INCISIONAL HERNIA REPAIR N/A 10/10/2020   Procedure: LAPAROSCOPIC  WITH TRASITION TO OPEN INCISIONAL HERNIA REPAIR WITH MESH;  Surgeon: Clovis Riley, MD;  Location: [facility];  Service: General;  Laterality: N/A;  . KNEE ARTHROSCOPY WITH MENISCAL REPAIR Right   . MOHS SURGERY    . TUBAL LIGATION  1991    Current Medications: Current Meds  Medication Sig  . acetaminophen (TYLENOL) 500 MG tablet Take 500-1,000 mg by mouth every 6 (six) hours as needed for moderate pain.   . calcium carbonate (OSCAL) 1500 (600 Ca) MG TABS tablet Take 600 mg of elemental calcium by mouth daily with  breakfast.  . diclofenac (VOLTAREN) 75 MG EC tablet Take 75 mg by mouth 2 (two) times daily.  [name] docusate sodium (COLACE) 100 MG capsule Take 200 mg by mouth daily.  . DULoxetine (CYMBALTA) 30 MG capsule TAKE 2 CAPSULES BY MOUTH ONCE A DAY  . icosapent Ethyl (VASCEPA) 1 g capsule TAKE 2 CAPULES BY MOUTH TWICE DAILY  . Insulin Pen Needle (BD PEN NEEDLE MICRO U/F) 32G X 6 MM MISC UAD  . Liraglutide -Weight Management 18 MG/3ML SOPN INJECT 1.2MG  MG UNDER THE SKIN DAILY FOR ONE WEEK. THEN 1.8MG  DAILY FOR 1 WEEK,THEN 2.4MG  DAILY FOR 1 WEEK, THEN 3MG  DAILY (Patient taking differently: Inject 1.8 mg into the skin every morning. Initiate at 1.2MG  mg per day for one week. In weekly intervals, increase the dose until a dose of 3 mg is reached. 1.2MG  then 1.8MG  then 2.4MG  then 3MG )  . losartan-hydrochlorothiazide (HYZAAR) 100-25 MG tablet TAKE 1 TABLET BY MOUTH DAILY.  . metoprolol tartrate (LOPRESSOR) 25 MG tablet TAKE 1 TABLET BY MOUTH 2 TIMES DAILY  . nitroGLYCERIN (NITROSTAT) 0.4 MG SL tablet 1 TABLET UNDER TONGUE EVERY 5 MINUTES UP TO 3 TIMES FOR CHEST PAIN THEN CALL DR IF NO RELIEF  . rosuvastatin (CRESTOR) 20 MG tablet TAKE 1 TABLET BY MOUTH 4 DAYS PER WEEK  . traZODone (DESYREL) 50 MG tablet TAKE 1 TO 2 TABLETS BY MOUTH AT BEDTIME AS NEEDED FOR SLEEP (Patient taking differently: Take by mouth at bedtime as needed. for sleep)  . varenicline (CHANTIX CONTINUING MONTH PAK) 1 MG tablet Take 1 tablet (1 mg total) by mouth 2 (two) times daily.     Allergies:   Patient has no known allergies.   Social History   Socioeconomic History  . Marital status: Single    Spouse name: Not on file  . Number of children: Not on file  . Years of education: Not on file  . Highest education level: Not on file  Occupational History  . 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 Smoking status: Former Smoker    Packs/day: 1.00    Years:  20.00    Pack years: 20.00    Types: Cigarettes    Quit date: 2010    Years since quitting: 12.2  . Smokeless tobacco: Never Used  . Tobacco comment: started back and quit again 6 months ago  Vaping Use  . Vaping Use: Never used  Substance and Sexual Activity  . Alcohol use: Not Currently    Comment: Rare   . Drug use: No  . Sexual activity: Yes    Birth control/protection: Post-menopausal  Other Topics Concern  . Not on file  Social History Narrative   Caffeine daily  Social Determinants of Health   Financial Resource Strain: Not on file  Food Insecurity: Not on file  Transportation Needs: Not on file  Physical Activity: Not on file  Stress: Not on file  Social Connections: Not on file     Family History: The patient's family history includes Arthritis in her father, sister, and sister; Cancer in her father; Dementia in her father; Diabetes in her paternal grandfather; Hyperlipidemia in her father, mother, sister, and sister; Hypertension in her father and mother; Other in her sister; Sleep apnea in her father. There is no history of Colon cancer.  ROS:   Please see the history of present illness.    Denies claudication.  All other systems reviewed and are negative.  EKGs/Labs/Other Studies Reviewed:    The following studies were reviewed today: No imaging studies  EKG:  EKG normal sinus rhythm with normal appearance.  Tracing was performed on3/09/2020.   Recent Labs: 10/11/2020: Magnesium 1.7 11/12/2020: ALT 13; BUN 13; Creatinine, Ser 0.66; Hemoglobin 14.7; Platelets 264; Potassium 4.0; Sodium 142; TSH 1.990  Recent Lipid Panel    Component Value Date/Time   CHOL 157 11/12/2020 0836   TRIG 212 (H) 11/12/2020 0836   TRIG 156 (H) 06/06/2013 1147   HDL 37 (L) 11/12/2020 0836   HDL 50 06/06/2013 1147   CHOLHDL 4.2 11/12/2020 0836   CHOLHDL 4.4 01/30/2019 0224   VLDL 19 01/30/2019 0224   LDLCALC 84 11/12/2020 0836   LDLCALC 50 06/06/2013 1147    Physical  Exam:    VS:  BP 140/60 (BP Location: Left Arm, Patient Position: Sitting, Cuff Size: Normal)   Pulse 68   Ht 5\' 2"  (1.575 m)   Wt 184 lb (83.5 kg)   LMP 12/31/2013   SpO2 95%   BMI 33.65 kg/m     Wt Readings from Last 3 Encounters:  11/13/20 184 lb (83.5 kg)  10/22/20 178 lb (80.7 kg)  10/10/20 182 lb (82.6 kg)     GEN: Overweight. No acute distress HEENT: Normal NECK: No JVD. LYMPHATICS: No lymphadenopathy CARDIAC: No murmur. RRR no gallop, or edema. VASCULAR:  Normal Pulses. No bruits. RESPIRATORY:  Clear to auscultation without rales, wheezing or rhonchi  ABDOMEN: Soft, non-tender, non-distended, No pulsatile mass, MUSCULOSKELETAL: No deformity  SKIN: Warm and dry NEUROLOGIC:  Alert and oriented x 3 PSYCHIATRIC:  Normal affect   ASSESSMENT:    1. CAD S/P percutaneous coronary angioplasty   2. OSA on CPAP   3. Mixed hyperlipidemia   4. Essential hypertension    PLAN:    In order of problems listed above:  1. Secondary prevention reviewed.  The importance of aerobic activity is stressed.  The importance of complete smoking cessation is readdressed. 2. Encouraged to continue CPAP. 3. Continue Crestor 10 mg/day.  Most recent LDL was around 80.  Triglyceride was greater than 200.  We discussed the added protection that will be provided by Vascepa.  She has decided to resume the medication. 4. Blood pressure control is borderline.  Low-salt diet, exercise, and weight loss are encouraged.  Target is 130/80 mmHg.  Overall education and awareness concerning primary/secondary risk prevention was discussed in detail: LDL less than 70, hemoglobin A1c less than 7, blood pressure target less than 130/80 mmHg, >150 minutes of moderate aerobic activity per week, avoidance of smoking, weight control (via diet and exercise), and continued surveillance/management of/for obstructive sleep apnea.   Medication Adjustments/Labs and Tests Ordered: Current medicines are reviewed at  length with the  patient today.  Concerns regarding medicines are outlined above.  No orders of the defined types were placed in this encounter.  No orders of the defined types were placed in this encounter.   There are no Patient Instructions on file for this visit.   Signed, Sinclair Grooms, MD  11/13/2020 3:41 PM    [place]

## 2020-11-13 NOTE — Patient Instructions (Signed)

## 2020-11-20 ENCOUNTER — Other Ambulatory Visit: Payer: Self-pay

## 2020-11-20 ENCOUNTER — Ambulatory Visit (INDEPENDENT_AMBULATORY_CARE_PROVIDER_SITE_OTHER): Payer: No Typology Code available for payment source | Admitting: Family Medicine

## 2020-11-20 ENCOUNTER — Encounter: Payer: Self-pay | Admitting: Family Medicine

## 2020-11-20 VITALS — BP 152/69 | HR 61 | Temp 97.6°F | Ht 62.0 in | Wt 178.0 lb

## 2020-11-20 DIAGNOSIS — M545 Low back pain, unspecified: Secondary | ICD-10-CM | POA: Diagnosis not present

## 2020-11-20 MED ORDER — KETOROLAC TROMETHAMINE 30 MG/ML IJ SOLN
30.0000 mg | Freq: Once | INTRAMUSCULAR | Status: AC
  Administered 2020-11-20: 60 mg via INTRAMUSCULAR

## 2020-11-20 MED ORDER — KETOROLAC TROMETHAMINE 60 MG/2ML IM SOLN: 60.0000 mg | Freq: Once | INTRAMUSCULAR | Status: DC

## 2020-11-20 MED ORDER — METHYLPREDNISOLONE ACETATE 80 MG/ML IJ SUSP
80.0000 mg | Freq: Once | INTRAMUSCULAR | Status: AC
  Administered 2020-11-20: 80 mg via INTRAMUSCULAR

## 2020-11-20 NOTE — Progress Notes (Signed)
Acute Office Visit  Subjective:    Patient ID: Heather Lam, female    DOB: 08/25/1960, 60 y.o.   MRN: [id number]  Chief Complaint  Patient presents with  . Back Pain    HPI Patient is in today for acute back pain since this morning. She slipped in the shower this morning. She caught herself before she fell but as she slipped she felt something pull in her lower left back. The pain is sharp. It is on the lower left side. The pain is a 5/10. The pain is worse with movement. It feels like it catches sometimes. Denies spasms. Denies numbness, tingling, saddles anesthesia, changes in bowel or bladder control. She is getting ready to leave for vacation.   Past Medical History:  Diagnosis Date  . Anxiety   . Back pain   . CAD (coronary artery disease)   . Chest pain   . Complication of anesthesia   . Constipation   . Coronary arteriosclerosis 05/04/2016  . Diverticulitis   . Diverticulosis   . Family history of adverse reaction to anesthesia    Mom PONV  . Heart attack ([place]) 2010  . History of carpal tunnel syndrome    Bilateral  . History of kidney stones   . Hyperlipidemia   . Hypertension   . Intra-abdominal abscess ([place])   . Joint pain   . Migraines   . Morbid obesity ([place])   . Myocardial infarction ([place]) 05/04/2009  . PONV (postoperative nausea and vomiting)   . Pre-diabetes    pt. denies . It is noted in pt. chart  . Skin cancer    on nose  . Sleep apnea    not using cpap at this time  . Vitamin D deficiency     Past Surgical History:  Procedure Laterality Date  . APPENDECTOMY    . APPLICATION OF WOUND VAC N/A 02/09/2019   Procedure: APPLICATION OF WOUND VAC;  Surgeon: Clovis Riley, MD;  Location: [place];  Service: General;  Laterality: N/A;  . BACK SURGERY    . CARPAL TUNNEL RELEASE Right   . CARPAL TUNNEL RELEASE Left 08/13/2016   Procedure: LEFT CARPAL TUNNEL RELEASE;  Surgeon: Roseanne Kaufman, MD;  Location: [facility];  Service:  Orthopedics;  Laterality: Left;  . COLECTOMY WITH COLOSTOMY CREATION/HARTMANN PROCEDURE N/A 02/09/2019   Procedure: HARTMANN PROCEDURE;  Surgeon: Clovis Riley, MD;  Location: [facility];  Service: General;  Laterality: N/A;  . COLONOSCOPY    . COLOSTOMY N/A 02/09/2019   Procedure: COLOSTOMY;  Surgeon: Clovis Riley, MD;  Location: [facility];  Service: General;  Laterality: N/A;  . COLOSTOMY REVERSAL N/A 07/30/2019   Procedure: LAPAROSCOPIC ASSISTED REVERSAL OF END COLOSTOMY WITH RIGID PROCTOSCOPY;  Surgeon: Clovis Riley, MD;  Location: [facility];  Service: General;  Laterality: N/A;  . CORONARY STENT PLACEMENT     2 stents in the same artery  . ENDOMETRIAL ABLATION  2005  . EXTRACORPOREAL SHOCK WAVE LITHOTRIPSY Right 06/27/2017   Procedure: RIGHT EXTRACORPOREAL SHOCK WAVE LITHOTRIPSY (ESWL);  Surgeon: Franchot Gallo, MD;  Location: [facility];  Service: Urology;  Laterality: Right;  . INCISIONAL HERNIA REPAIR N/A 10/10/2020   Procedure: LAPAROSCOPIC  WITH TRASITION TO OPEN INCISIONAL HERNIA REPAIR WITH MESH;  Surgeon: Clovis Riley, MD;  Location: [facility];  Service: General;  Laterality: N/A;  . KNEE ARTHROSCOPY WITH MENISCAL REPAIR Right   . MOHS SURGERY    . [place]  Family History  Problem Relation Age of Onset  . Hyperlipidemia Mother   . Hypertension Mother   . Cancer Father        skin  . Hyperlipidemia Father   . Dementia Father   . Arthritis Father   . Hypertension Father   . Sleep apnea Father   . Other Sister        Myelofibrosis  . Arthritis Sister   . Hyperlipidemia Sister   . Arthritis Sister   . Hyperlipidemia Sister   . Diabetes Paternal Grandfather   . Colon cancer Neg Hx     Social History   Socioeconomic History  . Marital status: Single    Spouse name: Not on file  . Number of children: Not on file  . Years of education: Not on file  . Highest education level: Not on file  Occupational History  . 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 Smoking status: Former Smoker    Packs/day: 1.00    Years: 20.00    Pack years: 20.00    Types: Cigarettes    Quit date: 2010    Years since quitting: 12.3  . Smokeless tobacco: Never Used  . Tobacco comment: started back and quit again 6 months ago  Vaping Use  . Vaping Use: Never used  Substance and Sexual Activity  . Alcohol use: Not Currently    Comment: Rare   . Drug use: No  . Sexual activity: Yes    Birth control/protection: Post-menopausal  Other Topics Concern  . Not on file  Social History Narrative   Caffeine daily    Social Determinants of Health   Financial Resource Strain: Not on file  Food Insecurity: Not on file  Transportation Needs: Not on file  Physical Activity: Not on file  Stress: Not on file  Social Connections: Not on file  Intimate Partner Violence: Not on file    Outpatient Medications Prior to Visit  Medication Sig Dispense Refill  . acetaminophen (TYLENOL) 500 MG tablet Take 500-1,000 mg by mouth every 6 (six) hours as needed for moderate pain.     . calcium carbonate (OSCAL) 1500 (600 Ca) MG TABS tablet Take 600 mg of elemental calcium by mouth daily with breakfast.    . diclofenac (VOLTAREN) 75 MG EC tablet Take 75 mg by mouth 2 (two) times daily.    [name] docusate sodium (COLACE) 100 MG capsule Take 200 mg by mouth daily.    . DULoxetine (CYMBALTA) 30 MG capsule TAKE 2 CAPSULES BY MOUTH ONCE A DAY 180 capsule 0  . icosapent Ethyl (VASCEPA) 1 g capsule TAKE 2 CAPULES BY MOUTH TWICE DAILY 360 capsule 1  . Insulin Pen Needle (BD PEN NEEDLE MICRO U/F) 32G X 6 MM MISC UAD 100 each 3  . Liraglutide -Weight Management 18 MG/3ML SOPN INJECT 1.2MG  MG UNDER THE SKIN DAILY FOR ONE WEEK. THEN 1.8MG  DAILY FOR 1 WEEK,THEN 2.4MG  DAILY FOR 1 WEEK, THEN 3MG  DAILY (Patient taking differently: Inject 1.8 mg into the skin every morning. Initiate at 1.2MG  mg per day for one week.  In weekly intervals, increase the dose until a dose of 3 mg is reached. 1.2MG  then 1.8MG  then 2.4MG  then 3MG ) 12 mL 1  . losartan-hydrochlorothiazide (HYZAAR) 100-25 MG tablet TAKE 1 TABLET BY MOUTH DAILY. 90 tablet 1  . metoprolol tartrate (LOPRESSOR) 25 MG tablet TAKE 1 TABLET BY MOUTH 2  TIMES DAILY 180 tablet 1  . nitroGLYCERIN (NITROSTAT) 0.4 MG SL tablet 1 TABLET UNDER TONGUE EVERY 5 MINUTES UP TO 3 TIMES FOR CHEST PAIN THEN CALL DR IF NO RELIEF 25 tablet 3  . rosuvastatin (CRESTOR) 20 MG tablet TAKE 1 TABLET BY MOUTH 4 DAYS PER WEEK 60 tablet 3  . traZODone (DESYREL) 50 MG tablet TAKE 1 TO 2 TABLETS BY MOUTH AT BEDTIME AS NEEDED FOR SLEEP (Patient taking differently: Take by mouth at bedtime as needed. for sleep) 90 tablet 3  . varenicline (CHANTIX CONTINUING MONTH PAK) 1 MG tablet Take 1 tablet (1 mg total) by mouth 2 (two) times daily. 60 tablet 3   No facility-administered medications prior to visit.    No Known Allergies  Review of Systems As per HPI.     Objective:    Physical Exam Vitals and nursing note reviewed.  Constitutional:      Appearance: She is not ill-appearing, toxic-appearing or diaphoretic.  Pulmonary:     Effort: Pulmonary effort is normal. No respiratory distress.  Musculoskeletal:     Lumbar back: Tenderness (left lower paraspinal tenderness) present. No swelling or bony tenderness. Negative right straight leg raise test and negative left straight leg raise test.     Right lower leg: No edema.     Left lower leg: No edema.  Skin:    General: Skin is warm and dry.  Neurological:     General: No focal deficit present.     Mental Status: She is alert and oriented to person, place, and time.  Psychiatric:        Mood and Affect: Mood normal.        Behavior: Behavior normal.     BP (!) 152/69   Pulse 61   Temp 97.6 F (36.4 C) (Temporal)   Ht 5\' 2"  (1.575 m)   Wt 178 lb (80.7 kg)   LMP 12/31/2013   BMI 32.56 kg/m  Wt Readings from Last 3  Encounters:  11/20/20 178 lb (80.7 kg)  11/13/20 184 lb (83.5 kg)  10/22/20 178 lb (80.7 kg)    Health Maintenance Due  Topic Date Due  . Hepatitis C Screening  Never done  . COVID-19 Vaccine (3 - Pfizer risk 4-dose series) 05/16/2020  . MAMMOGRAM  06/02/2020  . TETANUS/TDAP  08/02/2020    There are no preventive care reminders to display for this patient.   Lab Results  Component Value Date   TSH 1.990 11/12/2020   Lab Results  Component Value Date   WBC 10.0 11/12/2020   HGB 14.7 11/12/2020   HCT 44.4 11/12/2020   MCV 86 11/12/2020   PLT 264 11/12/2020   Lab Results  Component Value Date   NA 142 11/12/2020   K 4.0 11/12/2020   CO2 22 11/12/2020   GLUCOSE 95 11/12/2020   BUN 13 11/12/2020   CREATININE 0.66 11/12/2020   BILITOT <0.2 11/12/2020   ALKPHOS 79 11/12/2020   AST 15 11/12/2020   ALT 13 11/12/2020   PROT 6.7 11/12/2020   ALBUMIN 3.8 11/12/2020   CALCIUM 10.1 11/12/2020   ANIONGAP 9 10/11/2020   Lab Results  Component Value Date   CHOL 157 11/12/2020   Lab Results  Component Value Date   HDL 37 (L) 11/12/2020   Lab Results  Component Value Date   LDLCALC 84 11/12/2020   Lab Results  Component Value Date   TRIG 212 (H) 11/12/2020   Lab Results  Component Value Date  CHOLHDL 4.2 11/12/2020   Lab Results  Component Value Date   HGBA1C 5.6 11/12/2020       Assessment & Plan:   Klaudia was seen today for back pain.  Diagnoses and all orders for this visit:  Acute left-sided low back pain without sciatica IM injections as below in office today. Tylenol, heat, ice as needed for pain. Return to office for new or worsening symptoms, or if symptoms persist.  -     methylPREDNISolone acetate (DEPO-MEDROL) injection 80 mg -     ketorolac (TORADOL) injection 60 mg  The patient indicates understanding of these issues and agrees with the plan.  Gwenlyn Perking, FNP

## 2020-11-20 NOTE — Addendum Note (Signed)
Addended by: Milas Hock on: 11/20/2020 10:37 AM   Modules accepted: Orders

## 2020-12-04 ENCOUNTER — Encounter: Payer: Self-pay | Admitting: Nurse Practitioner

## 2020-12-04 ENCOUNTER — Ambulatory Visit (INDEPENDENT_AMBULATORY_CARE_PROVIDER_SITE_OTHER): Payer: No Typology Code available for payment source | Admitting: Nurse Practitioner

## 2020-12-04 ENCOUNTER — Other Ambulatory Visit: Payer: Self-pay

## 2020-12-04 VITALS — Temp 96.8°F | Resp 20 | Ht 62.0 in | Wt 178.0 lb

## 2020-12-04 DIAGNOSIS — L02235 Carbuncle of perineum: Secondary | ICD-10-CM | POA: Diagnosis not present

## 2020-12-04 NOTE — Progress Notes (Signed)
   Subjective:    Patient ID: Heather Lam, female    DOB: [birth year], 60 y.o.   MRN: [id number]   Chief Complaint: Cyst   HPI Patient has cystic lesion on left pubis that continues to become infected. Wants it cut out   Review of Systems  Constitutional: Negative for diaphoresis.  Eyes: Negative for pain.  Respiratory: Negative for shortness of breath.   Cardiovascular: Negative for chest pain, palpitations and leg swelling.  Gastrointestinal: Negative for abdominal pain.  Endocrine: Negative for polydipsia.  Skin: Negative for rash.  Neurological: Negative for dizziness, weakness and headaches.  Hematological: Does not bruise/bleed easily.  All other systems reviewed and are negative.       Objective:   Physical Exam Vitals and nursing note reviewed.  Constitutional:      Appearance: Normal appearance.  Cardiovascular:     Rate and Rhythm: Normal rate and regular rhythm.     Heart sounds: Normal heart sounds.  Genitourinary:    Comments: Cystic lesion left side of pubis- no erythema.  Skin:    General: Skin is warm.  Neurological:     Mental Status: She is alert.    Temp (!) 96.8 F (36 C) (Temporal)   Resp 20   Ht 5\' 2"  (1.575 m)   Wt 178 lb (80.7 kg)   LMP 12/31/2013   BMI 32.56 kg/m   Skin excision  Date/Time: 12/04/2020 3:27 PM Performed by: Chevis Pretty, FNP Authorized by: Hassell Done Mary-Margaret, FNP   Number of Lesions: 1 Lesion 1:    Body area: trunk   Trunk location: groin   Initial size (mm): 2   Malignancy: benign lesion     Repair type: linear closure   Closure complexity: simple           Assessment & Plan:  Heather Lam in today with chief complaint of Cyst   1. Carbuncle of perineum Cyst sac removed Remove suture in 10 days    The above assessment and management plan was discussed with the patient. The patient verbalized understanding of and has agreed to the management plan. Patient is aware to call the  clinic if symptoms persist or worsen. Patient is aware when to return to the clinic for a follow-up visit. Patient educated on when it is appropriate to go to the emergency department.   Mary-Margaret Hassell Done, FNP

## 2020-12-12 ENCOUNTER — Encounter: Payer: Self-pay | Admitting: Nurse Practitioner

## 2020-12-12 ENCOUNTER — Ambulatory Visit (INDEPENDENT_AMBULATORY_CARE_PROVIDER_SITE_OTHER): Payer: No Typology Code available for payment source | Admitting: Nurse Practitioner

## 2020-12-12 DIAGNOSIS — Z4802 Encounter for removal of sutures: Secondary | ICD-10-CM

## 2020-12-12 NOTE — Progress Notes (Signed)
   Subjective:    Patient ID: Heather Lam, female    DOB: 03-Jul-1961, 60 y.o.   MRN: [id number]   Chief Complaint: Suture / Staple Removal   HPI Patient had cyst removed form left side of pubis last week. Is here today for suture removal. Denies any pain or discharge from area.   Review of Systems  Constitutional: Negative for diaphoresis.  Eyes: Negative for pain.  Respiratory: Negative for shortness of breath.   Cardiovascular: Negative for chest pain, palpitations and leg swelling.  Gastrointestinal: Negative for abdominal pain.  Endocrine: Negative for polydipsia.  Skin: Negative for rash.  Neurological: Negative for dizziness, weakness and headaches.  Hematological: Does not bruise/bleed easily.  All other systems reviewed and are negative.      Objective:   Physical Exam Skin:    Comments: Wound edges well approximated- no erythema or discharge.           Assessment & Plan:  Heather Lam in today with chief complaint of Suture / Staple Removal   1. Visit for suture removal No special care needed    The above assessment and management plan was discussed with the patient. The patient verbalized understanding of and has agreed to the management plan. Patient is aware to call the clinic if symptoms persist or worsen. Patient is aware when to return to the clinic for a follow-up visit. Patient educated on when it is appropriate to go to the emergency department.   Mary-Margaret Hassell Done, FNP

## 2020-12-17 ENCOUNTER — Other Ambulatory Visit (HOSPITAL_COMMUNITY): Payer: Self-pay

## 2020-12-17 MED FILL — Trazodone HCl Tab 50 MG: ORAL | 45 days supply | Qty: 90 | Fill #0 | Status: AC

## 2020-12-17 MED FILL — Icosapent Ethyl Cap 1 GM: ORAL | 90 days supply | Qty: 360 | Fill #0 | Status: AC

## 2020-12-25 ENCOUNTER — Other Ambulatory Visit: Payer: No Typology Code available for payment source

## 2020-12-25 ENCOUNTER — Other Ambulatory Visit: Payer: Self-pay

## 2020-12-25 DIAGNOSIS — Z1152 Encounter for screening for COVID-19: Secondary | ICD-10-CM

## 2020-12-26 LAB — SARS-COV-2, NAA 2 DAY TAT

## 2020-12-26 LAB — NOVEL CORONAVIRUS, NAA: SARS-CoV-2, NAA: NOT DETECTED

## 2021-01-20 ENCOUNTER — Ambulatory Visit (INDEPENDENT_AMBULATORY_CARE_PROVIDER_SITE_OTHER): Payer: No Typology Code available for payment source | Admitting: Family Medicine

## 2021-01-20 ENCOUNTER — Encounter: Payer: Self-pay | Admitting: Family Medicine

## 2021-01-20 ENCOUNTER — Other Ambulatory Visit: Payer: Self-pay

## 2021-01-20 ENCOUNTER — Other Ambulatory Visit (HOSPITAL_COMMUNITY): Payer: Self-pay

## 2021-01-20 ENCOUNTER — Ambulatory Visit (INDEPENDENT_AMBULATORY_CARE_PROVIDER_SITE_OTHER): Payer: No Typology Code available for payment source

## 2021-01-20 VITALS — BP 133/70 | HR 68 | Temp 97.8°F | Ht 62.0 in | Wt 178.0 lb

## 2021-01-20 DIAGNOSIS — M2550 Pain in unspecified joint: Secondary | ICD-10-CM

## 2021-01-20 DIAGNOSIS — Z0001 Encounter for general adult medical examination with abnormal findings: Secondary | ICD-10-CM

## 2021-01-20 DIAGNOSIS — M25552 Pain in left hip: Secondary | ICD-10-CM

## 2021-01-20 DIAGNOSIS — I252 Old myocardial infarction: Secondary | ICD-10-CM

## 2021-01-20 DIAGNOSIS — R7303 Prediabetes: Secondary | ICD-10-CM | POA: Diagnosis not present

## 2021-01-20 DIAGNOSIS — Z0000 Encounter for general adult medical examination without abnormal findings: Secondary | ICD-10-CM

## 2021-01-20 MED ORDER — NAPROXEN 500 MG PO TABS
500.0000 mg | ORAL_TABLET | Freq: Two times a day (BID) | ORAL | 3 refills | Status: DC | PRN
  Filled 2021-01-20: qty 180, 90d supply, fill #0

## 2021-01-20 MED ORDER — OZEMPIC (0.25 OR 0.5 MG/DOSE) 2 MG/1.5ML ~~LOC~~ SOPN
PEN_INJECTOR | SUBCUTANEOUS | 3 refills | Status: DC
  Filled 2021-01-20: qty 4.5, 90d supply, fill #0
  Filled 2021-05-06: qty 1.5, 30d supply, fill #1
  Filled 2021-07-14: qty 1.5, 28d supply, fill #2

## 2021-01-20 MED ORDER — DULOXETINE HCL 60 MG PO CPEP
60.0000 mg | ORAL_CAPSULE | Freq: Every day | ORAL | 3 refills | Status: DC
Start: 2021-01-20 — End: 2021-03-13
  Filled 2021-01-20: qty 90, 90d supply, fill #0

## 2021-01-20 MED ORDER — METHYLPREDNISOLONE ACETATE 40 MG/ML IJ SUSP
40.0000 mg | Freq: Once | INTRAMUSCULAR | Status: AC
  Administered 2021-01-20: 80 mg via INTRAMUSCULAR

## 2021-01-20 MED ORDER — METHYLPREDNISOLONE ACETATE 80 MG/ML IJ SUSP: 80.0000 mg | Freq: Once | INTRAMUSCULAR | Status: DC

## 2021-01-20 NOTE — Progress Notes (Signed)
Heather Lam is a 60 y.o. female presents to office today for annual physical exam examination.    Concerns today include: 1.  Polyarthralgia Patient reports that she is been suffering from polyarthralgia for at least the last 3 months.  She did have COVID infection back in January.  She has been chronically on a statin but symptoms did not appear with initiation of statin.  She was prescribed Voltaren which she was afraid to take given her history of MI is only use this a handful of times.  She is treated with Cymbalta 60 mg daily which has helped some in general with musculoskeletal complaints but really has not been sufficient in controlling her symptoms.  She uses Tylenol.  She does report a left-sided hip pain that became quite severe one-point, particularly when it radiated to her left groin.  She does not report any clicking or instability.  No reports of sensory changes in the left lower extremity nor any preceding injury.  No known family history of autoimmune disease including lupus, rheumatoid arthritis or psoriatic arthritis.  2.  Obesity with hypertension, hyperlipidemia and history of MI Currently treated with Saxenda but notes that she is not really consistent in using this medicine because she "forgets".  She be willing to go to a once weekly however.  Denies any intolerance to the medication.  No reports of nausea, vomiting or diarrhea with medicine.  She does feel that it works when she utilizes it.  3.  Tobacco use disorder Patient mitts that she has not been entirely consistent with use of Chantix.  She just recently restarted 1 tablet daily and expects to advance to twice daily.  She has gone down from over 1 pack/day to half a pack per day.  Denies any unplanned weight loss, difficulty swallowing, change in voice, night sweats, hemoptysis, claudication of the feet or hands.  Occupation: Marine scientist, Marital status: divorced, Substance use: tobacco Diet: fair, Exercise: no  structured Last eye exam: UTD Last dental exam: UTD Last colonoscopy: UTD Last mammogram: UTD Last pap smear: UTD; OBGYN soon Refills needed today: all Immunizations needed:  Immunization History  Administered Date(s) Administered   Hepatitis B 08/02/2002   Influenza,inj,Quad PF,6+ Mos 05/06/2015, 05/10/2016, 05/04/2017, 04/28/2018, 05/23/2019   Influenza-Unspecified 05/27/2009, 05/14/2010, 05/22/2012, 06/02/2013, 05/02/2020   PFIZER(Purple Top)SARS-COV-2 Vaccination 03/27/2020, 04/18/2020   Td 10/16/2009   Tdap 10/16/2009, 08/02/2010    Past Medical History:  Diagnosis Date   Anxiety    Back pain    CAD (coronary artery disease)    Chest pain    Complication of anesthesia    Constipation    Coronary arteriosclerosis 05/04/2016   Diverticulitis    Diverticulosis    Family history of adverse reaction to anesthesia    Mom PONV   Heart attack ([place]) 2010   History of carpal tunnel syndrome    Bilateral   History of kidney stones    Hyperlipidemia    Hypertension    Intra-abdominal abscess ([place])    Joint pain    Migraines    Morbid obesity ([place])    Myocardial infarction ([place]) 05/04/2009   PONV (postoperative nausea and vomiting)    Pre-diabetes    pt. denies . It is noted in pt. chart   Skin cancer    on nose   Sleep apnea    not using cpap at this time   Vitamin D deficiency    Social History   Socioeconomic History   Marital status: Single  Spouse name: Not on file   Number of children: Not on file   Years of education: Not on file   Highest education level: Not on file  Occupational History   Occupation: Nurse    Employer: [employer]    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[employer] Use   Smoking status: Former    Packs/day: 1.00    Years: 20.00    Pack years: 20.00    Types: Cigarettes    Quit date: 2010    Years since quitting: 12.4   Smokeless tobacco: Never   Tobacco comments:    started back and quit again 6 months ago   Vaping Use   Vaping Use: Never used  Substance and Sexual Activity   Alcohol use: Not Currently    Comment: Rare    Drug use: No   Sexual activity: Yes    Birth control/protection: Post-menopausal  Other Topics Concern   Not on file  Social History Narrative   Caffeine daily    Social Determinants of Health   Financial Resource Strain: Not on file  Food Insecurity: Not on file  Transportation Needs: Not on file  Physical Activity: Not on file  Stress: Not on file  Social Connections: Not on file  Intimate Partner Violence: Not on file   Past Surgical History:  Procedure Laterality Date   APPENDECTOMY     APPLICATION OF WOUND VAC N/A 02/09/2019   Procedure: APPLICATION OF WOUND VAC;  Surgeon: Clovis Riley, MD;  Location: [facility];  Service: General;  Laterality: N/A;   BACK SURGERY     CARPAL TUNNEL RELEASE Right    CARPAL TUNNEL RELEASE Left 08/13/2016   Procedure: LEFT CARPAL [place];  Surgeon: Roseanne Kaufman, MD;  Location: [facility];  Service: Orthopedics;  Laterality: Left;   COLECTOMY WITH COLOSTOMY CREATION/HARTMANN PROCEDURE N/A 02/09/2019   Procedure: HARTMANN PROCEDURE;  Surgeon: Clovis Riley, MD;  Location: [facility];  Service: General;  Laterality: N/A;   COLONOSCOPY     COLOSTOMY N/A 02/09/2019   Procedure: COLOSTOMY;  Surgeon: Clovis Riley, MD;  Location: [facility];  Service: General;  Laterality: N/A;   COLOSTOMY REVERSAL N/A 07/30/2019   Procedure: LAPAROSCOPIC ASSISTED REVERSAL OF END COLOSTOMY WITH RIGID PROCTOSCOPY;  Surgeon: Clovis Riley, MD;  Location: [facility];  Service: General;  Laterality: N/A;   CORONARY STENT PLACEMENT     2 stents in the same artery   ENDOMETRIAL ABLATION  2005   [place] LITHOTRIPSY Right 06/27/2017   Procedure: RIGHT EXTRACORPOREAL SHOCK WAVE LITHOTRIPSY (ESWL);  Surgeon: Franchot Gallo, MD;  Location: [facility];  Service: Urology;  Laterality: Right;   INCISIONAL HERNIA REPAIR N/A  10/10/2020   Procedure: LAPAROSCOPIC  WITH TRASITION TO OPEN INCISIONAL HERNIA REPAIR WITH MESH;  Surgeon: Clovis Riley, MD;  Location: [facility];  Service: General;  Laterality: N/A;   KNEE ARTHROSCOPY WITH MENISCAL REPAIR Right    MOHS SURGERY     TUBAL LIGATION  1991   Family History  Problem Relation Age of Onset   Hyperlipidemia Mother    Hypertension Mother    Cancer Father        skin   Hyperlipidemia Father    Dementia Father    Arthritis Father    Hypertension Father    Sleep apnea Father    Other Sister        Myelofibrosis   Arthritis Sister    Hyperlipidemia Sister  Arthritis Sister    Hyperlipidemia Sister    Diabetes Paternal Grandfather    Colon cancer Neg Hx     Current Outpatient Medications:    acetaminophen (TYLENOL) 500 MG tablet, Take 500-1,000 mg by mouth every 6 (six) hours as needed for moderate pain. , Disp: , Rfl:    calcium carbonate (OSCAL) 1500 (600 Ca) MG TABS tablet, Take 600 mg of elemental calcium by mouth daily with breakfast., Disp: , Rfl:    diclofenac (VOLTAREN) 75 MG EC tablet, Take 75 mg by mouth 2 (two) times daily., Disp: , Rfl:    docusate sodium (COLACE) 100 MG capsule, Take 200 mg by mouth daily., Disp: , Rfl:    DULoxetine (CYMBALTA) 30 MG capsule, TAKE 2 CAPSULES BY MOUTH ONCE A DAY, Disp: 180 capsule, Rfl: 0   icosapent Ethyl (VASCEPA) 1 g capsule, TAKE 2 CAPULES BY MOUTH TWICE DAILY, Disp: 360 capsule, Rfl: 1   Insulin Pen Needle (BD PEN NEEDLE MICRO U/F) 32G X 6 MM MISC, UAD, Disp: 100 each, Rfl: 3   Liraglutide -Weight Management 18 MG/3ML SOPN, INJECT 1.2MG MG UNDER THE SKIN DAILY FOR ONE WEEK. THEN 1.8MG DAILY FOR 1 WEEK,THEN 2.4MG DAILY FOR 1 WEEK, THEN 3MG DAILY (Patient taking differently: Inject 1.8 mg into the skin every morning. Initiate at 1.2MG mg per day for one week. In weekly intervals, increase the dose until a dose of 3 mg is reached. 1.2MG then 1.8MG then 2.4MG then 3MG), Disp: 12 mL, Rfl: 1    losartan-hydrochlorothiazide (HYZAAR) 100-25 MG tablet, TAKE 1 TABLET BY MOUTH DAILY., Disp: 90 tablet, Rfl: 1   metoprolol tartrate (LOPRESSOR) 25 MG tablet, TAKE 1 TABLET BY MOUTH 2 TIMES DAILY, Disp: 180 tablet, Rfl: 1   nitroGLYCERIN (NITROSTAT) 0.4 MG SL tablet, 1 TABLET UNDER TONGUE EVERY 5 MINUTES UP TO 3 TIMES FOR CHEST PAIN THEN CALL DR IF NO RELIEF, Disp: 25 tablet, Rfl: 3   traZODone (DESYREL) 50 MG tablet, TAKE 1 TO 2 TABLETS BY MOUTH AT BEDTIME AS NEEDED FOR SLEEP, Disp: 90 tablet, Rfl: 3   varenicline (CHANTIX CONTINUING MONTH PAK) 1 MG tablet, Take 1 tablet (1 mg total) by mouth 2 (two) times daily., Disp: 60 tablet, Rfl: 3   rosuvastatin (CRESTOR) 20 MG tablet, TAKE 1 TABLET BY MOUTH 4 DAYS PER WEEK, Disp: 60 tablet, Rfl: 3  No Known Allergies   ROS: Review of Systems Pertinent items noted in HPI and remainder of comprehensive ROS otherwise negative.    Physical exam BP 133/70   Pulse 68   Temp 97.8 F (36.6 C)   Ht _0  (1.575 m)   Wt 178 lb (80.7 kg)   LMP 12/31/2013   SpO2 95%   BMI 32.56 kg/m  General appearance: alert, cooperative, appears stated age, and no distress Head: Normocephalic, without obvious abnormality, atraumatic Eyes: negative findings: lids and lashes normal, conjunctivae and sclerae normal, corneas clear, and pupils equal, round, reactive to light and accomodation Ears: normal TM's and external ear canals both ears Nose: Nares normal. Septum midline. Mucosa normal. No drainage or sinus tenderness. Throat: lips, mucosa, and tongue normal; teeth and gums normal and specifically no oropharyngeal masses.  No sublingual masses. Neck: no adenopathy, no carotid bruit, supple, symmetrical, trachea midline, and thyroid not enlarged, symmetric, no tenderness/mass/nodules Back: symmetric, no curvature. ROM normal. No CVA tenderness. Lungs: clear to auscultation bilaterally Heart: regular rate and rhythm, S1, S2 normal, no murmur, click, rub or  gallop Abdomen: soft, non-tender; bowel  sounds normal; no masses,  no organomegaly Extremities: extremities normal, atraumatic, no cyanosis or edema Pulses: 2+ and symmetric Skin: Skin color, texture, turgor normal. No rashes or lesions Lymph nodes: Cervical, supraclavicular, and axillary nodes normal. Neurologic: Alert and oriented X 3, normal strength and tone. Normal symmetric reflexes. Normal coordination and gait Psych: Mood stable, speech normal, thought process linear Depression screen [facility] 2/9 01/20/2021 12/04/2020 09/05/2020  Decreased Interest 0 0 0  Down, Depressed, Hopeless 0 0 0  PHQ - 2 Score 0 0 0  Altered sleeping - - -  Tired, decreased energy - - -  Change in appetite - - -  Feeling bad or failure about yourself  - - -  Trouble concentrating - - -  Moving slowly or fidgety/restless - - -  Suicidal thoughts - - -  PHQ-9 Score - - -  Difficult doing work/chores - - -  Some recent data might be hidden   GAD 7 : Generalized Anxiety Score 01/20/2021 11/01/2019 02/21/2019  Nervous, Anxious, on Edge 0 0 3  Control/stop worrying 0 0 1  Worry too much - different things 0 0 0  Trouble relaxing 0 0 0  Restless 0 0 0  Easily annoyed or irritable 0 0 0  Afraid - awful might happen 0 0 0  Total GAD 7 Score 0 0 4  Anxiety Difficulty Not difficult at all - -    Assessment/ Plan: [name] here for annual physical exam.   Annual physical exam  Polyarthralgia - Plan: Arthritis Panel, ANA w/Reflex if Positive, DULoxetine (CYMBALTA) 60 MG capsule, naproxen (NAPROSYN) 500 MG tablet, DISCONTINUED: methylPREDNISolone acetate (DEPO-MEDROL) injection 80 mg  Prediabetes - Plan: Semaglutide,0.25 or 0.5MG/DOS, (OZEMPIC, 0.25 OR 0.5 MG/DOSE,) 2 MG/1.5ML SOPN  Morbid obesity ([facility]) - Plan: Semaglutide,0.25 or 0.5MG/DOS, (OZEMPIC, 0.25 OR 0.5 MG/DOSE,) 2 MG/1.5ML SOPN  History of heart attack  Pain of left hip joint - Plan: methylPREDNISolone acetate (DEPO-MEDROL) injection 40 mg, DG  HIP UNILAT WITH PELVIS 2-3 VIEWS LEFT  Polyarthralgia of uncertain etiology.  Her last CBC was normal.  No elevation in white blood cell count.  Symptoms seem to have been present since she had COVID infection back in January.  I do question a possible new autoimmune disease.  Will obtain CRP, ESR, CBC and ANA.  No known family history of autoimmune disease.  Depo-Medrol shot injected today for symptom relief.  Advised against ongoing use of Voltaren given history of MI.  Will replace with Naprosyn for as needed use but again cautioned interaction with Cymbalta and increased risk of GI bleed with NSAID use.  Encouraged use of Tylenol or topical NSAID  Ozempic to replace Saxenda.  She has been having some difficulty remembering to take the medication so hopefully this will simplify her regimen.  Start with 0.25 q. 7 days for the next couple of weeks and then she can increase to 0.5 if tolerated.  Given left-sided hip pain, I do think this is intra-articular nature and likely degenerative.  Films have been ordered to further evaluate.  If significant degenerative changes are noted I will place a referral to orthopedics to consider intra-articular injection versus surgical intervention.   Kambria Grima M. Lajuana Ripple, DO

## 2021-01-21 ENCOUNTER — Other Ambulatory Visit (HOSPITAL_COMMUNITY): Payer: Self-pay

## 2021-01-21 LAB — ARTHRITIS PANEL
Basophils Absolute: 0.1 10*3/uL (ref 0.0–0.2)
Basos: 1 %
EOS (ABSOLUTE): 0.2 10*3/uL (ref 0.0–0.4)
Eos: 2 %
Hematocrit: 42.7 % (ref 34.0–46.6)
Hemoglobin: 14 g/dL (ref 11.1–15.9)
Immature Grans (Abs): 0.1 10*3/uL (ref 0.0–0.1)
Immature Granulocytes: 1 %
Lymphocytes Absolute: 3.4 10*3/uL — ABNORMAL HIGH (ref 0.7–3.1)
Lymphs: 31 %
MCH: 28.2 pg (ref 26.6–33.0)
MCHC: 32.8 g/dL (ref 31.5–35.7)
MCV: 86 fL (ref 79–97)
Monocytes Absolute: 0.6 10*3/uL (ref 0.1–0.9)
Monocytes: 6 %
Neutrophils Absolute: 6.6 10*3/uL (ref 1.4–7.0)
Neutrophils: 59 %
Platelets: 220 10*3/uL (ref 150–450)
RBC: 4.97 x10E6/uL (ref 3.77–5.28)
RDW: 13.6 % (ref 11.7–15.4)
Rheumatoid fact SerPl-aCnc: <10 IU/mL (ref <14.0)
Sed Rate: 18 mm/hr (ref 0–40)
Uric Acid: 5.5 mg/dL (ref 3.0–7.2)
WBC: 11 10*3/uL — ABNORMAL HIGH (ref 3.4–10.8)

## 2021-01-21 LAB — ANA W/REFLEX IF POSITIVE: Anti Nuclear Antibody (ANA): NEGATIVE

## 2021-01-23 ENCOUNTER — Other Ambulatory Visit: Payer: Self-pay | Admitting: Interventional Cardiology

## 2021-01-23 MED FILL — Metoprolol Tartrate Tab 25 MG: ORAL | 90 days supply | Qty: 180 | Fill #0 | Status: AC

## 2021-01-24 ENCOUNTER — Other Ambulatory Visit (HOSPITAL_COMMUNITY): Payer: Self-pay

## 2021-01-26 ENCOUNTER — Other Ambulatory Visit (HOSPITAL_COMMUNITY): Payer: Self-pay

## 2021-01-26 MED ORDER — ROSUVASTATIN CALCIUM 20 MG PO TABS
20.0000 mg | ORAL_TABLET | ORAL | 2 refills | Status: DC
  Filled 2021-01-26: qty 48, 84d supply, fill #0
  Filled 2021-07-02: qty 48, 84d supply, fill #1
  Filled 2021-10-01: qty 48, 84d supply, fill #2
  Filled 2022-01-15: qty 48, 84d supply, fill #3

## 2021-01-26 NOTE — Telephone Encounter (Signed)
Not sure why she was only given 60.  Ok to change to 90.  Thanks!

## 2021-01-26 NOTE — Telephone Encounter (Signed)
Rx(s) sent to pharmacy electronically.  

## 2021-01-27 ENCOUNTER — Other Ambulatory Visit (HOSPITAL_COMMUNITY): Payer: Self-pay

## 2021-01-28 ENCOUNTER — Encounter: Payer: Self-pay | Admitting: Family Medicine

## 2021-01-28 ENCOUNTER — Other Ambulatory Visit: Payer: Self-pay | Admitting: Family Medicine

## 2021-01-28 DIAGNOSIS — M25552 Pain in left hip: Secondary | ICD-10-CM

## 2021-01-28 MED ORDER — PREDNISONE 10 MG (21) PO TBPK: ORAL_TABLET | ORAL | 0 refills | Status: DC

## 2021-01-29 ENCOUNTER — Other Ambulatory Visit (HOSPITAL_COMMUNITY): Payer: Self-pay

## 2021-01-29 MED FILL — Losartan Potassium & Hydrochlorothiazide Tab 100-25 MG: ORAL | 60 days supply | Qty: 60 | Fill #1 | Status: AC

## 2021-02-09 ENCOUNTER — Other Ambulatory Visit: Payer: Self-pay | Admitting: Family Medicine

## 2021-02-09 DIAGNOSIS — M1612 Unilateral primary osteoarthritis, left hip: Secondary | ICD-10-CM

## 2021-02-18 ENCOUNTER — Other Ambulatory Visit: Payer: Self-pay | Admitting: Family Medicine

## 2021-02-18 ENCOUNTER — Telehealth: Payer: Self-pay | Admitting: Family Medicine

## 2021-02-18 DIAGNOSIS — M25552 Pain in left hip: Secondary | ICD-10-CM

## 2021-02-18 MED ORDER — PREDNISONE 10 MG (21) PO TBPK: ORAL_TABLET | ORAL | 0 refills | Status: DC

## 2021-02-19 ENCOUNTER — Telehealth: Payer: No Typology Code available for payment source | Admitting: Family Medicine

## 2021-03-12 ENCOUNTER — Encounter: Payer: Self-pay | Admitting: Family Medicine

## 2021-03-13 ENCOUNTER — Other Ambulatory Visit (HOSPITAL_COMMUNITY): Payer: Self-pay

## 2021-03-13 ENCOUNTER — Other Ambulatory Visit: Payer: Self-pay | Admitting: Family Medicine

## 2021-03-13 MED ORDER — DULOXETINE HCL 30 MG PO CPEP
30.0000 mg | ORAL_CAPSULE | Freq: Every day | ORAL | 0 refills | Status: DC
  Filled 2021-03-13: qty 60, 78d supply, fill #0

## 2021-03-17 ENCOUNTER — Encounter: Payer: Self-pay | Admitting: Family Medicine

## 2021-03-17 ENCOUNTER — Other Ambulatory Visit: Payer: Self-pay

## 2021-03-17 ENCOUNTER — Ambulatory Visit (INDEPENDENT_AMBULATORY_CARE_PROVIDER_SITE_OTHER): Payer: No Typology Code available for payment source | Admitting: Family Medicine

## 2021-03-17 VITALS — BP 138/76 | HR 83 | Temp 98.0°F | Ht 62.0 in | Wt 181.0 lb

## 2021-03-17 DIAGNOSIS — N3001 Acute cystitis with hematuria: Secondary | ICD-10-CM | POA: Diagnosis not present

## 2021-03-17 DIAGNOSIS — R300 Dysuria: Secondary | ICD-10-CM

## 2021-03-17 LAB — MICROSCOPIC EXAMINATION
Epithelial Cells (non renal): NONE SEEN /hpf (ref 0–10)
RBC, Urine: >30 /hpf — AB (ref 0–2)
Renal Epithel, UA: NONE SEEN /hpf

## 2021-03-17 LAB — URINALYSIS, COMPLETE
Bilirubin, UA: NEGATIVE
Nitrite, UA: POSITIVE — AB
Specific Gravity, UA: 1.02 (ref 1.005–1.030)
Urobilinogen, Ur: 4 mg/dL — ABNORMAL HIGH (ref 0.2–1.0)
pH, UA: 6 (ref 5.0–7.5)

## 2021-03-17 MED ORDER — CEFTRIAXONE SODIUM 1 G IJ SOLR
1.0000 g | Freq: Once | INTRAMUSCULAR | Status: AC
Start: 2021-03-17 — End: 2021-03-17
  Administered 2021-03-17: 1 g via INTRAMUSCULAR

## 2021-03-17 MED ORDER — PHENAZOPYRIDINE HCL 100 MG PO TABS
100.0000 mg | ORAL_TABLET | Freq: Three times a day (TID) | ORAL | 0 refills | Status: DC | PRN
Start: 2021-03-17 — End: 2021-05-12

## 2021-03-17 MED ORDER — CEPHALEXIN 500 MG PO CAPS: 500.0000 mg | ORAL_CAPSULE | Freq: Four times a day (QID) | ORAL | 0 refills | Status: AC

## 2021-03-17 NOTE — Progress Notes (Signed)
Subjective:  Patient ID: [name], female    DOB: [birth year], 60 y.o.   MRN: [id number]  Patient Care Team: [name], DO as PCP - General (Family Medicine) Belva Crome, MD as PCP - Cardiology (Cardiology) Lavera Guise, [facility] (Pharmacist)   Chief Complaint:  Dysuria   HPI: HENRITTA DANTONIO is a 60 y.o. female presenting on 03/17/2021 for Dysuria   Dysuria  This is a new problem. The current episode started in the past 7 days. The problem has been gradually worsening. Quality: pressure. The pain is at a severity of 3/10. The pain is mild. There has been no fever. She is Not sexually active. There is No history of pyelonephritis. Associated symptoms include frequency, hematuria, hesitancy and urgency. Pertinent negatives include no chills, discharge, flank pain, nausea, possible pregnancy, sweats or vomiting. She has tried nothing for the symptoms.   Relevant past medical, surgical, family, and social history reviewed and updated as indicated.  Allergies and medications reviewed and updated. Data reviewed: Chart in Epic.   Past Medical History:  Diagnosis Date   Anxiety    Back pain    CAD (coronary artery disease)    Chest pain    Complication of anesthesia    Constipation    Coronary arteriosclerosis 05/04/2016   Diverticulitis    Diverticulosis    Family history of adverse reaction to anesthesia    Mom PONV   Heart attack ([place]) 2010   History of carpal tunnel syndrome    Bilateral   History of kidney stones    Hyperlipidemia    Hypertension    Intra-abdominal abscess ([place])    Joint pain    Migraines    Morbid obesity ([place])    Myocardial infarction ([place]) 05/04/2009   PONV (postoperative nausea and vomiting)    Pre-diabetes    pt. denies . It is noted in pt. chart   Skin cancer    on nose   Sleep apnea    not using cpap at this time   Vitamin D deficiency     Past Surgical History:  Procedure Laterality Date   APPENDECTOMY     APPLICATION  OF WOUND VAC N/A 02/09/2019   Procedure: APPLICATION OF WOUND VAC;  Surgeon: Clovis Riley, MD;  Location: [place];  Service: General;  Laterality: N/A;   BACK SURGERY     CARPAL TUNNEL RELEASE Right    CARPAL TUNNEL RELEASE Left 08/13/2016   Procedure: LEFT CARPAL TUNNEL RELEASE;  Surgeon: Roseanne Kaufman, MD;  Location: [place];  Service: Orthopedics;  Laterality: Left;   COLECTOMY WITH COLOSTOMY CREATION/HARTMANN PROCEDURE N/A 02/09/2019   Procedure: HARTMANN PROCEDURE;  Surgeon: Clovis Riley, MD;  Location: [place];  Service: General;  Laterality: N/A;   COLONOSCOPY     COLOSTOMY N/A 02/09/2019   Procedure: COLOSTOMY;  Surgeon: Clovis Riley, MD;  Location: [place];  Service: General;  Laterality: N/A;   COLOSTOMY REVERSAL N/A 07/30/2019   Procedure: LAPAROSCOPIC ASSISTED REVERSAL OF END COLOSTOMY WITH RIGID PROCTOSCOPY;  Surgeon: Clovis Riley, MD;  [facility];  Service: General;  Laterality: N/A;   CORONARY STENT PLACEMENT     2 stents in the same artery   ENDOMETRIAL ABLATION  2005   [place] LITHOTRIPSY Right 06/27/2017   Procedure: RIGHT EXTRACORPOREAL SHOCK WAVE LITHOTRIPSY (ESWL);  Surgeon: Franchot Gallo, MD;  [facility];  Service: Urology;  Laterality: Right;   INCISIONAL HERNIA  REPAIR N/A 10/10/2020   Procedure: LAPAROSCOPIC  WITH TRASITION TO OPEN INCISIONAL HERNIA REPAIR WITH MESH;  Surgeon: Clovis Riley, MD;  [facility];  Service: General;  Laterality: N/A;   KNEE ARTHROSCOPY WITH MENISCAL REPAIR Right    MOHS SURGERY     TUBAL LIGATION  1991    Social History   Socioeconomic History   Marital status: Single    Spouse name: Not on file   Number of children: Not on file   Years of education: Not on file   Highest education level: Not on file  Occupational History   Occupation: Nurse    Employer: [employer]    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[employer] Use   Smoking status:  Former    Packs/day: 1.00    Years: 20.00    Pack years: 20.00    Types: Cigarettes    Quit date: 2010    Years since quitting: 12.6   Smokeless tobacco: Never   Tobacco comments:    started back and quit again 6 months ago  Vaping Use   Vaping Use: Never used  Substance and Sexual Activity   Alcohol use: Not Currently    Comment: Rare    Drug use: No   Sexual activity: Yes    Birth control/protection: Post-menopausal  Other Topics Concern   Not on file  Social History Narrative   Caffeine daily    Social Determinants of Health   Financial Resource Strain: Not on file  Food Insecurity: Not on file  Transportation Needs: Not on file  Physical Activity: Not on file  Stress: Not on file  Social Connections: Not on file  Intimate Partner Violence: Not on file    Outpatient Encounter Medications as of 03/17/2021  Medication Sig   calcium carbonate (OSCAL) 1500 (600 Ca) MG TABS tablet Take 600 mg of elemental calcium by mouth daily with breakfast.   cephALEXin (KEFLEX) 500 MG capsule Take 1 capsule (500 mg total) by mouth every 6 (six) hours for 7 days.   Cholecalciferol 125 MCG (5000 UT) TABS Take by mouth.   Coenzyme Q10 100 MG TABS Take by mouth.   docusate sodium (COLACE) 100 MG capsule Take 200 mg by mouth daily.   DULoxetine (CYMBALTA) 30 MG capsule Take 1 capsule (30 mg total) by mouth daily for 6 weeks.  Then switch to every other day until gone.   icosapent Ethyl (VASCEPA) 1 g capsule TAKE 2 CAPULES BY MOUTH TWICE DAILY   losartan-hydrochlorothiazide (HYZAAR) 100-25 MG tablet TAKE 1 TABLET BY MOUTH DAILY.   metoprolol tartrate (LOPRESSOR) 25 MG tablet TAKE 1 TABLET BY MOUTH 2 TIMES DAILY   Multiple Vitamin (MULTIVITAMIN) tablet Take 1 tablet by mouth daily.   nitroGLYCERIN (NITROSTAT) 0.4 MG SL tablet 1 TABLET UNDER TONGUE EVERY 5 MINUTES UP TO 3 TIMES FOR CHEST PAIN THEN CALL DR IF NO RELIEF   phenazopyridine (PYRIDIUM) 100 MG tablet Take 1 tablet (100 mg total) by  mouth 3 (three) times daily as needed for pain.   rosuvastatin (CRESTOR) 20 MG tablet Take 1 tablet (20 mg total) by mouth 4 (four) times a week.   Semaglutide,0.25 or 0.'5MG'$ /DOS, (OZEMPIC, 0.25 OR 0.5 MG/DOSE,) 2 MG/1.5ML SOPN Inject 0.25 mg into the skin once a week for 14 days, THEN 0.5 mg once a week.   traZODone (DESYREL) 50 MG tablet TAKE 1 TO 2 TABLETS BY MOUTH AT BEDTIME AS NEEDED FOR SLEEP   varenicline (CHANTIX CONTINUING MONTH  PAK) 1 MG tablet Take 1 tablet (1 mg total) by mouth 2 (two) times daily.   [DISCONTINUED] acetaminophen (TYLENOL) 500 MG tablet Take 500-1,000 mg by mouth every 6 (six) hours as needed for moderate pain.    [DISCONTINUED] gabapentin (NEURONTIN) 300 MG capsule Take 1 capsule (300 mg total) by mouth every 8 (eight) hours as needed.   [DISCONTINUED] Insulin Pen Needle (BD PEN NEEDLE MICRO U/F) 32G X 6 MM MISC UAD   [DISCONTINUED] naproxen (NAPROSYN) 500 MG tablet Take 1 tablet (500 mg total) by mouth 2 (two) times daily as needed.   [DISCONTINUED] predniSONE (STERAPRED UNI-PAK 21 TAB) 10 MG (21) TBPK tablet UAD as per package   [EXPIRED] cefTRIAXone (ROCEPHIN) injection 1 g    No facility-administered encounter medications on file as of 03/17/2021.    No Known Allergies  Review of Systems  Constitutional:  Negative for activity change, appetite change, chills, diaphoresis, fatigue, fever and unexpected weight change.  HENT: Negative.    Eyes: Negative.   Respiratory:  Negative for cough, chest tightness and shortness of breath.   Cardiovascular:  Negative for chest pain, palpitations and leg swelling.  Gastrointestinal:  Positive for abdominal pain (lower abdominal pressure with voiding). Negative for abdominal distention, anal bleeding, blood in stool, constipation, diarrhea, nausea, rectal pain and vomiting.  Endocrine: Negative.   Genitourinary:  Positive for decreased urine volume, difficulty urinating, dysuria, frequency, hematuria, hesitancy and urgency.  Negative for dyspareunia, enuresis, flank pain, genital sores, menstrual problem, pelvic pain, vaginal bleeding, vaginal discharge and vaginal pain.  Musculoskeletal:  Negative for arthralgias and myalgias.  Skin: Negative.   Allergic/Immunologic: Negative.   Neurological:  Negative for dizziness, tremors, seizures, syncope, facial asymmetry, speech difficulty, weakness, light-headedness, numbness and headaches.  Hematological: Negative.   Psychiatric/Behavioral:  Negative for confusion, hallucinations, sleep disturbance and suicidal ideas.   All other systems reviewed and are negative.      Objective:  BP 138/76   Pulse 83   Temp 98 F (36.7 C)   Ht '5\' 2"'$  (1.575 m)   Wt 181 lb (82.1 kg)   LMP 12/31/2013   SpO2 96%   BMI 33.11 kg/m    Wt Readings from Last 3 Encounters:  03/17/21 181 lb (82.1 kg)  01/20/21 178 lb (80.7 kg)  12/04/20 178 lb (80.7 kg)    Physical Exam Vitals and nursing note reviewed.  Constitutional:      General: She is not in acute distress.    Appearance: Normal appearance. She is well-developed and well-groomed. She is not ill-appearing, toxic-appearing or diaphoretic.  HENT:     Head: Normocephalic and atraumatic.     Jaw: There is normal jaw occlusion.     Right Ear: Hearing normal.     Left Ear: Hearing normal.     Nose: Nose normal.     Mouth/Throat:     Lips: Pink.     Mouth: Mucous membranes are moist.     Pharynx: Oropharynx is clear. Uvula midline.  Eyes:     General: Lids are normal.     Extraocular Movements: Extraocular movements intact.     Conjunctiva/sclera: Conjunctivae normal.     Pupils: Pupils are equal, round, and reactive to light.  Neck:     Thyroid: No thyroid mass, thyromegaly or thyroid tenderness.     Vascular: No carotid bruit or JVD.     Trachea: Trachea and phonation normal.  Cardiovascular:     Rate and Rhythm: Normal rate and regular rhythm.  Chest Wall: PMI is not displaced.     Pulses: Normal pulses.      Heart sounds: Normal heart sounds. No murmur heard.   No friction rub. No gallop.  Pulmonary:     Effort: Pulmonary effort is normal. No respiratory distress.     Breath sounds: Normal breath sounds. No wheezing.  Abdominal:     General: Bowel sounds are normal. There is no distension or abdominal bruit.     Palpations: Abdomen is soft. There is no hepatomegaly or splenomegaly.     Tenderness: There is no abdominal tenderness. There is no right CVA tenderness, left CVA tenderness, guarding or rebound.     Hernia: No hernia is present.  Musculoskeletal:        General: Normal range of motion.     Cervical back: Normal range of motion and neck supple.     Right lower leg: No edema.     Left lower leg: No edema.  Lymphadenopathy:     Cervical: No cervical adenopathy.  Skin:    General: Skin is warm and dry.     Capillary Refill: Capillary refill takes less than 2 seconds.     Coloration: Skin is not cyanotic, jaundiced or pale.     Findings: No rash.  Neurological:     General: No focal deficit present.     Mental Status: She is alert and oriented to person, place, and time.     Cranial Nerves: Cranial nerves are intact.     Sensory: Sensation is intact.     Motor: Motor function is intact.     Coordination: Coordination is intact.     Gait: Gait is intact.     Deep Tendon Reflexes: Reflexes are normal and symmetric.  Psychiatric:        Attention and Perception: Attention and perception normal.        Mood and Affect: Mood and affect normal.        Speech: Speech normal.        Behavior: Behavior normal. Behavior is cooperative.        Thought Content: Thought content normal.        Cognition and Memory: Cognition and memory normal.        Judgment: Judgment normal.    Results for orders placed or performed in visit on 01/20/21  Arthritis Panel  Result Value Ref Range   Uric Acid 5.5 3.0 - 7.2 mg/dL   Rhuematoid fact SerPl-aCnc <10.0 <14.0 IU/mL   WBC 11.0 (H) 3.4 - 10.8  x10E3/uL   RBC 4.97 3.77 - 5.28 x10E6/uL   Hemoglobin 14.0 11.1 - 15.9 g/dL   Hematocrit 42.7 34.0 - 46.6 %   MCV 86 79 - 97 fL   MCH 28.2 26.6 - 33.0 pg   MCHC 32.8 31.5 - 35.7 g/dL   RDW 13.6 11.7 - 15.4 %   Platelets 220 150 - 450 x10E3/uL   Neutrophils 59 Not Estab. %   Lymphs 31 Not Estab. %   Monocytes 6 Not Estab. %   Eos 2 Not Estab. %   Basos 1 Not Estab. %   Neutrophils Absolute 6.6 1.4 - 7.0 x10E3/uL   Lymphocytes Absolute 3.4 (H) 0.7 - 3.1 x10E3/uL   Monocytes Absolute 0.6 0.1 - 0.9 x10E3/uL   EOS (ABSOLUTE) 0.2 0.0 - 0.4 x10E3/uL   Basophils Absolute 0.1 0.0 - 0.2 x10E3/uL   Immature Granulocytes 1 Not Estab. %   Immature Grans (Abs) 0.1 0.0 - 0.1  x10E3/uL   Sed Rate 18 0 - 40 mm/hr  ANA w/Reflex if Positive  Result Value Ref Range   Anti Nuclear Antibody (ANA) Negative Negative     Urinalysis in office: 3+ leukocytes, positive nitrites, 3+ protein, 3+ blood, 1+ ketones, 3+ bilirubin, trace glucose.   Pertinent labs & imaging results that were available during my care of the patient were reviewed by me and considered in my medical decision making.  Assessment & Plan:  Ivon was seen today for dysuria.  Diagnoses and all orders for this visit:  Dysuria Onset Saturday. No flank pain, fever, chills, weakness, or confusion. -     Urine Culture -     Urinalysis, Complete -     phenazopyridine (PYRIDIUM) 100 MG tablet; Take 1 tablet (100 mg total) by mouth 3 (three) times daily as needed for pain.  Acute cystitis with hematuria Urinalysis as noted. Dosed with rocephin in office. Start Keflex tomorrow. Culture pending, will change treatment if warranted. No red flags concerning for acute pyelonephritis. Symptomatic care discussed in detail. Pt aware to report any new or worsening symptoms. Recheck urine in 2 weeks. If hematuria still present, will refer to urology.  -     cephALEXin (KEFLEX) 500 MG capsule; Take 1 capsule (500 mg total) by mouth every 6 (six) hours  for 7 days. -     phenazopyridine (PYRIDIUM) 100 MG tablet; Take 1 tablet (100 mg total) by mouth 3 (three) times daily as needed for pain. -     Urinalysis, Complete; Future -     cefTRIAXone (ROCEPHIN) injection 1 g     Continue all other maintenance medications.  Follow up plan: Return in about 2 weeks (around 03/31/2021), or if symptoms worsen or fail to improve, for Urine recheck.   Continue healthy lifestyle choices, including diet (rich in fruits, vegetables, and lean proteins, and low in salt and simple carbohydrates) and exercise (at least 30 minutes of moderate physical activity daily).  Educational handout given for UTI  The above assessment and management plan was discussed with the patient. The patient verbalized understanding of and has agreed to the management plan. Patient is aware to call the clinic if they develop any new symptoms or if symptoms persist or worsen. Patient is aware when to return to the clinic for a follow-up visit. Patient educated on when it is appropriate to go to the emergency department.   Monia Pouch, FNP-C [facility] [phone]

## 2021-03-19 LAB — URINE CULTURE

## 2021-03-26 ENCOUNTER — Other Ambulatory Visit: Payer: Self-pay | Admitting: Family Medicine

## 2021-03-27 ENCOUNTER — Other Ambulatory Visit (HOSPITAL_COMMUNITY): Payer: Self-pay

## 2021-03-27 MED ORDER — LOSARTAN POTASSIUM-HCTZ 100-25 MG PO TABS
1.0000 | ORAL_TABLET | Freq: Every day | ORAL | 1 refills | Status: DC
  Filled 2021-03-27: qty 90, 90d supply, fill #0
  Filled 2021-06-29: qty 90, 90d supply, fill #1

## 2021-04-09 ENCOUNTER — Encounter: Payer: Self-pay | Admitting: Family Medicine

## 2021-04-09 ENCOUNTER — Ambulatory Visit (INDEPENDENT_AMBULATORY_CARE_PROVIDER_SITE_OTHER): Payer: No Typology Code available for payment source | Admitting: Family Medicine

## 2021-04-09 ENCOUNTER — Other Ambulatory Visit: Payer: Self-pay

## 2021-04-09 VITALS — BP 129/73 | HR 67 | Temp 97.8°F | Ht 62.0 in | Wt 179.0 lb

## 2021-04-09 DIAGNOSIS — B3741 Candidal cystitis and urethritis: Secondary | ICD-10-CM | POA: Diagnosis not present

## 2021-04-09 DIAGNOSIS — N3001 Acute cystitis with hematuria: Secondary | ICD-10-CM

## 2021-04-09 LAB — MICROSCOPIC EXAMINATION: Renal Epithel, UA: NONE SEEN /hpf

## 2021-04-09 LAB — URINALYSIS, COMPLETE
Bilirubin, UA: NEGATIVE
Glucose, UA: NEGATIVE
Ketones, UA: NEGATIVE
Nitrite, UA: POSITIVE — AB
Specific Gravity, UA: 1.015 (ref 1.005–1.030)
Urobilinogen, Ur: 0.2 mg/dL (ref 0.2–1.0)
pH, UA: 5.5 (ref 5.0–7.5)

## 2021-04-09 MED ORDER — SULFAMETHOXAZOLE-TRIMETHOPRIM 800-160 MG PO TABS: 1.0000 | ORAL_TABLET | Freq: Two times a day (BID) | ORAL | 0 refills | Status: AC

## 2021-04-09 MED ORDER — FLUCONAZOLE 150 MG PO TABS: ORAL_TABLET | ORAL | 0 refills | Status: DC

## 2021-04-09 NOTE — Progress Notes (Signed)
Acute Office Visit  Subjective:    Patient ID: Heather Lam, female    DOB: [birth year], 60 y.o.   MRN: [id number]  Chief Complaint  Patient presents with   Urinary Tract Infection    HPI Patient is in today for UTI symptoms for 2 days. She reports lower abdominal pressure, urinary odor, cloudiness, and frequency. Denies dysuria, fever, chills, flank pain, nausea, or vomiting. She recently had a UTI and was treated with keflex. She reports her symptoms did improve after completing the antibiotic.   Past Medical History:  Diagnosis Date   Anxiety    Back pain    CAD (coronary artery disease)    Chest pain    Complication of anesthesia    Constipation    Coronary arteriosclerosis 05/04/2016   Diverticulitis    Diverticulosis    Family history of adverse reaction to anesthesia    Mom PONV   Heart attack ([place]) 2010   History of carpal tunnel syndrome    Bilateral   History of kidney stones    Hyperlipidemia    Hypertension    Intra-abdominal abscess ([place])    Joint pain    Migraines    Morbid obesity ([place])    Myocardial infarction ([place]) 05/04/2009   PONV (postoperative nausea and vomiting)    Pre-diabetes    pt. denies . It is noted in pt. chart   Skin cancer    on nose   Sleep apnea    not using cpap at this time   Vitamin D deficiency     Past Surgical History:  Procedure Laterality Date   APPENDECTOMY     APPLICATION OF WOUND VAC N/A 02/09/2019   Procedure: APPLICATION OF WOUND VAC;  Surgeon: Clovis Riley, MD;  Location: [place];  Service: General;  Laterality: N/A;   BACK SURGERY     CARPAL TUNNEL RELEASE Right    CARPAL TUNNEL RELEASE Left 08/13/2016   Procedure: LEFT CARPAL TUNNEL RELEASE;  Surgeon: Roseanne Kaufman, MD;  Location: [place];  Service: Orthopedics;  Laterality: Left;   COLECTOMY WITH COLOSTOMY CREATION/HARTMANN PROCEDURE N/A 02/09/2019   Procedure: HARTMANN PROCEDURE;  Surgeon: Clovis Riley, MD;  Location: [place];   Service: General;  Laterality: N/A;   COLONOSCOPY     COLOSTOMY N/A 02/09/2019   Procedure: COLOSTOMY;  Surgeon: Clovis Riley, MD;  Location: [place];  Service: General;  Laterality: N/A;   COLOSTOMY REVERSAL N/A 07/30/2019   Procedure: LAPAROSCOPIC ASSISTED REVERSAL OF END COLOSTOMY WITH RIGID PROCTOSCOPY;  Surgeon: Clovis Riley, MD;  [facility];  Service: General;  Laterality: N/A;   CORONARY STENT PLACEMENT     2 stents in the same artery   ENDOMETRIAL ABLATION  2005   [place] LITHOTRIPSY Right 06/27/2017   Procedure: RIGHT EXTRACORPOREAL SHOCK WAVE LITHOTRIPSY (ESWL);  Surgeon: Franchot Gallo, MD;  [facility];  Service: Urology;  Laterality: Right;   INCISIONAL HERNIA REPAIR N/A 10/10/2020   Procedure: LAPAROSCOPIC  WITH TRASITION TO OPEN INCISIONAL HERNIA REPAIR WITH MESH;  Surgeon: Clovis Riley, MD;  [facility];  Service: General;  Laterality: N/A;   KNEE ARTHROSCOPY WITH MENISCAL REPAIR Right    MOHS SURGERY     TUBAL LIGATION  1991    Family History  Problem Relation Age of Onset   Hyperlipidemia Mother    Hypertension Mother    Cancer Father        skin   Hyperlipidemia Father  Dementia Father    Arthritis Father    Hypertension Father    Sleep apnea Father    Other Sister        Myelofibrosis   Arthritis Sister    Hyperlipidemia Sister    Arthritis Sister    Hyperlipidemia Sister    Diabetes Paternal Grandfather    Colon cancer Neg Hx     Social History   Socioeconomic History   Marital status: Single    Spouse name: Not on file   Number of children: Not on file   Years of education: Not on file   Highest education level: Not on file  Occupational History   Occupation: Optician, dispensing: [employer]    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   Smoking status: Former    Packs/day: 1.00    Years: 20.00    Pack years: 20.00    Types: Cigarettes    Quit date: 2010    Years  since quitting: 12.6   Smokeless tobacco: Never   Tobacco comments:    started back and quit again 6 months ago  Vaping Use   Vaping Use: Never used  Substance and Sexual Activity   Alcohol use: Not Currently    Comment: Rare    Drug use: No   Sexual activity: Yes    Birth control/protection: Post-menopausal  Other Topics Concern   Not on file  Social History Narrative   Caffeine daily    Social Determinants of Health   Financial Resource Strain: Not on file  Food Insecurity: Not on file  Transportation Needs: Not on file  Physical Activity: Not on file  Stress: Not on file  Social Connections: Not on file  Intimate Partner Violence: Not on file    Outpatient Medications Prior to Visit  Medication Sig Dispense Refill   calcium carbonate (OSCAL) 1500 (600 Ca) MG TABS tablet Take 600 mg of elemental calcium by mouth daily with breakfast.     Cholecalciferol 125 MCG (5000 UT) TABS Take by mouth.     Coenzyme Q10 100 MG TABS Take by mouth.     docusate sodium (COLACE) 100 MG capsule Take 200 mg by mouth daily.     DULoxetine (CYMBALTA) 30 MG capsule Take 1 capsule (30 mg total) by mouth daily for 6 weeks.  Then switch to every other day until gone. 60 capsule 0   losartan-hydrochlorothiazide (HYZAAR) 100-25 MG tablet TAKE 1 TABLET BY MOUTH DAILY. 90 tablet 1   metoprolol tartrate (LOPRESSOR) 25 MG tablet TAKE 1 TABLET BY MOUTH 2 TIMES DAILY 180 tablet 1   Multiple Vitamin (MULTIVITAMIN) tablet Take 1 tablet by mouth daily.     nitroGLYCERIN (NITROSTAT) 0.4 MG SL tablet 1 TABLET UNDER TONGUE EVERY 5 MINUTES UP TO 3 TIMES FOR CHEST PAIN THEN CALL DR IF NO RELIEF 25 tablet 3   rosuvastatin (CRESTOR) 20 MG tablet Take 1 tablet (20 mg total) by mouth 4 (four) times a week. 90 tablet 2   Semaglutide,0.25 or 0.5MG/DOS, (OZEMPIC, 0.25 OR 0.5 MG/DOSE,) 2 MG/1.5ML SOPN Inject 0.25 mg into the skin once a week for 14 days, THEN 0.5 mg once a week. 4.5 mL 3   traZODone (DESYREL) 50 MG tablet  TAKE 1 TO 2 TABLETS BY MOUTH AT BEDTIME AS NEEDED FOR SLEEP 90 tablet 3   varenicline (CHANTIX CONTINUING MONTH PAK) 1 MG tablet Take 1 tablet (1 mg total) by mouth 2 (two) times daily. 60 tablet  3   icosapent Ethyl (VASCEPA) 1 g capsule TAKE 2 CAPULES BY MOUTH TWICE DAILY (Patient not taking: Reported on 04/09/2021) 360 capsule 1   phenazopyridine (PYRIDIUM) 100 MG tablet Take 1 tablet (100 mg total) by mouth 3 (three) times daily as needed for pain. (Patient not taking: Reported on 04/09/2021) 10 tablet 0   No facility-administered medications prior to visit.    No Known Allergies  Review of Systems As per HPI.     Objective:    Physical Exam Vitals and nursing note reviewed.  Constitutional:      General: She is not in acute distress.    Appearance: She is not ill-appearing, toxic-appearing or diaphoretic.  Pulmonary:     Effort: Pulmonary effort is normal. No respiratory distress.  Abdominal:     General: Bowel sounds are normal. There is no distension.     Palpations: Abdomen is soft.     Tenderness: There is no abdominal tenderness. There is no right CVA tenderness, left CVA tenderness, guarding or rebound.  Skin:    General: Skin is warm and dry.  Neurological:     General: No focal deficit present.     Mental Status: She is alert and oriented to person, place, and time.  Psychiatric:        Mood and Affect: Mood normal.        Behavior: Behavior normal.    BP 129/73   Pulse 67   Temp 97.8 F (36.6 C) (Temporal)   Ht _0  (1.575 m)   Wt 179 lb (81.2 kg)   LMP 12/31/2013   BMI 32.74 kg/m  Wt Readings from Last 3 Encounters:  04/09/21 179 lb (81.2 kg)  03/17/21 181 lb (82.1 kg)  01/20/21 178 lb (80.7 kg)   Urine dipstick shows positive for RBC's, positive for nitrates, and positive for leukocytes.  Micro exam: 6-10 WBC's per HPF, 3-10 RBC's per HPF, many+ bacteria, and yeast.   Health Maintenance Due  Topic Date Due   COVID-19 Vaccine (3 - Pfizer risk series)  05/16/2020   INFLUENZA VACCINE  03/02/2021   PAP SMEAR-Modifier  05/31/2021    There are no preventive care reminders to display for this patient.   Lab Results  Component Value Date   TSH 1.990 11/12/2020   Lab Results  Component Value Date   WBC 11.0 (H) 01/20/2021   HGB 14.0 01/20/2021   HCT 42.7 01/20/2021   MCV 86 01/20/2021   PLT 220 01/20/2021   Lab Results  Component Value Date   NA 142 11/12/2020   K 4.0 11/12/2020   CO2 22 11/12/2020   GLUCOSE 95 11/12/2020   BUN 13 11/12/2020   CREATININE 0.66 11/12/2020   BILITOT <0.2 11/12/2020   ALKPHOS 79 11/12/2020   AST 15 11/12/2020   ALT 13 11/12/2020   PROT 6.7 11/12/2020   ALBUMIN 3.8 11/12/2020   CALCIUM 10.1 11/12/2020   ANIONGAP 9 10/11/2020   EGFR 100 11/12/2020   Lab Results  Component Value Date   CHOL 157 11/12/2020   Lab Results  Component Value Date   HDL 37 (L) 11/12/2020   Lab Results  Component Value Date   LDLCALC 84 11/12/2020   Lab Results  Component Value Date   TRIG 212 (H) 11/12/2020   Lab Results  Component Value Date   CHOLHDL 4.2 11/12/2020   Lab Results  Component Value Date   HGBA1C 5.6 11/12/2020       Assessment & Plan:  Janijah was seen today for urinary tract infection.  Diagnoses and all orders for this visit:  Acute cystitis with hematuria Culture pending. Bactrim as below.  -     Urinalysis, Complete -     sulfamethoxazole-trimethoprim (BACTRIM DS) 800-160 MG tablet; Take 1 tablet by mouth 2 (two) times daily for 7 days. -     Urine Culture -     fluconazole (DIFLUCAN) 150 MG tablet; Take one tablet my mouth now. Repeat after completion of antibiotics.  Yeast cystitis Diflucan ordered.  -     fluconazole (DIFLUCAN) 150 MG tablet; Take one tablet my mouth now. Repeat after completion of antibiotics.  Return to office for new or worsening symptoms, or if symptoms persist.   The patient indicates understanding of these issues and agrees with the  plan.  Gwenlyn Perking, FNP

## 2021-04-20 ENCOUNTER — Other Ambulatory Visit: Payer: Self-pay | Admitting: Family Medicine

## 2021-04-20 DIAGNOSIS — Z720 Tobacco use: Secondary | ICD-10-CM

## 2021-04-23 ENCOUNTER — Other Ambulatory Visit: Payer: Self-pay | Admitting: Family Medicine

## 2021-04-23 MED FILL — Trazodone HCl Tab 50 MG: ORAL | 45 days supply | Qty: 90 | Fill #1 | Status: AC

## 2021-04-24 ENCOUNTER — Other Ambulatory Visit (HOSPITAL_COMMUNITY): Payer: Self-pay

## 2021-04-24 MED ORDER — METOPROLOL TARTRATE 25 MG PO TABS
ORAL_TABLET | Freq: Two times a day (BID) | ORAL | 1 refills | Status: DC
  Filled 2021-04-24: qty 180, 90d supply, fill #0
  Filled 2021-07-21: qty 180, 90d supply, fill #1

## 2021-05-01 ENCOUNTER — Other Ambulatory Visit (INDEPENDENT_AMBULATORY_CARE_PROVIDER_SITE_OTHER): Payer: No Typology Code available for payment source | Admitting: Family Medicine

## 2021-05-01 DIAGNOSIS — M1612 Unilateral primary osteoarthritis, left hip: Secondary | ICD-10-CM | POA: Diagnosis not present

## 2021-05-01 MED ORDER — HYDROCODONE-ACETAMINOPHEN 5-325 MG PO TABS: 1.0000 | ORAL_TABLET | Freq: Four times a day (QID) | ORAL | 0 refills | Status: AC | PRN

## 2021-05-01 NOTE — Progress Notes (Signed)
Subjective: CC: hip paib PCP: [name], DO HKV:[id number] K Townsend is a 60 y.o. female presenting to clinic today for:  1.  Hip pain Patient with ongoing left-sided hip pain.  She is status post corticosteroid injection in the left hip about 1 week ago.  Initially was helpful but now the pain is worse than when it started.  The only thing that has helped thus far has been prednisone.  However, she has been through 2 rounds of oral prednisone and 1 corticosteroid injection recently.  Her next office visit with Ortho is not until 20 October.   ROS: Per HPI  No Known Allergies Past Medical History:  Diagnosis Date   Anxiety    Back pain    CAD (coronary artery disease)    Chest pain    Complication of anesthesia    Constipation    Coronary arteriosclerosis 05/04/2016   Diverticulitis    Diverticulosis    Family history of adverse reaction to anesthesia    Mom PONV   Heart attack ([place]) 2010   History of carpal tunnel syndrome    Bilateral   History of kidney stones    Hyperlipidemia    Hypertension    Intra-abdominal abscess ([place])    Joint pain    Migraines    Morbid obesity ([place])    Myocardial infarction ([place]) 05/04/2009   PONV (postoperative nausea and vomiting)    Pre-diabetes    pt. denies . It is noted in pt. chart   Skin cancer    on nose   Sleep apnea    not using cpap at this time   Vitamin D deficiency     Current Outpatient Medications:    HYDROcodone-acetaminophen (NORCO) 5-325 MG tablet, Take 1 tablet by mouth every 6 (six) hours as needed for up to 5 days for moderate pain., Disp: 20 tablet, Rfl: 0   calcium carbonate (OSCAL) 1500 (600 Ca) MG TABS tablet, Take 600 mg of elemental calcium by mouth daily with breakfast., Disp: , Rfl:    Cholecalciferol 125 MCG (5000 UT) TABS, Take by mouth., Disp: , Rfl:    Coenzyme Q10 100 MG TABS, Take by mouth., Disp: , Rfl:    docusate sodium (COLACE) 100 MG capsule, Take 200 mg by mouth daily., Disp: , Rfl:     DULoxetine (CYMBALTA) 30 MG capsule, Take 1 capsule (30 mg total) by mouth daily for 6 weeks.  Then switch to every other day until gone., Disp: 60 capsule, Rfl: 0   fluconazole (DIFLUCAN) 150 MG tablet, Take one tablet my mouth now. Repeat after completion of antibiotics., Disp: 2 tablet, Rfl: 0   icosapent Ethyl (VASCEPA) 1 g capsule, TAKE 2 CAPULES BY MOUTH TWICE DAILY (Patient not taking: Reported on 04/09/2021), Disp: 360 capsule, Rfl: 1   losartan-hydrochlorothiazide (HYZAAR) 100-25 MG tablet, TAKE 1 TABLET BY MOUTH DAILY., Disp: 90 tablet, Rfl: 1   metoprolol tartrate (LOPRESSOR) 25 MG tablet, TAKE 1 TABLET BY MOUTH 2 TIMES DAILY, Disp: 180 tablet, Rfl: 1   Multiple Vitamin (MULTIVITAMIN) tablet, Take 1 tablet by mouth daily., Disp: , Rfl:    nitroGLYCERIN (NITROSTAT) 0.4 MG SL tablet, 1 TABLET UNDER TONGUE EVERY 5 MINUTES UP TO 3 TIMES FOR CHEST PAIN THEN CALL DR IF NO RELIEF, Disp: 25 tablet, Rfl: 3   phenazopyridine (PYRIDIUM) 100 MG tablet, Take 1 tablet (100 mg total) by mouth 3 (three) times daily as needed for pain. (Patient not taking: Reported on 04/09/2021), Disp: 10 tablet,  Rfl: 0   rosuvastatin (CRESTOR) 20 MG tablet, Take 1 tablet (20 mg total) by mouth 4 (four) times a week., Disp: 90 tablet, Rfl: 2   Semaglutide,0.25 or 0.5MG /DOS, (OZEMPIC, 0.25 OR 0.5 MG/DOSE,) 2 MG/1.5ML SOPN, Inject 0.25 mg into the skin once a week for 14 days, THEN 0.5 mg once a week., Disp: 4.5 mL, Rfl: 3   traZODone (DESYREL) 50 MG tablet, TAKE 1 TO 2 TABLETS BY MOUTH AT BEDTIME AS NEEDED FOR SLEEP, Disp: 90 tablet, Rfl: 3   varenicline (CHANTIX) 1 MG tablet, TAKE  (1)  TABLET TWICE A DAY., Disp: 60 tablet, Rfl: 3 Social History   Socioeconomic History   Marital status: Single    Spouse name: Not on file   Number of children: Not on file   Years of education: Not on file   Highest education level: Not on file  Occupational History   Occupation: Optician, dispensing: [employer]    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[employer] Use   Smoking status: Former    Packs/day: 1.00    Years: 20.00    Pack years: 20.00    Types: Cigarettes    Quit date: 2010    Years since quitting: 12.7   Smokeless tobacco: Never   Tobacco comments:    started back and quit again 6 months ago  Vaping Use   Vaping Use: Never used  Substance and Sexual Activity   Alcohol use: Not Currently    Comment: Rare    Drug use: No   Sexual activity: Yes    Birth control/protection: Post-menopausal  Other Topics Concern   Not on file  Social History Narrative   Caffeine daily    Social Determinants of Health   Financial Resource Strain: Not on file  Food Insecurity: Not on file  Transportation Needs: Not on file  Physical Activity: Not on file  Stress: Not on file  Social Connections: Not on file  Intimate Partner Violence: Not on file   Family History  Problem Relation Age of Onset   Hyperlipidemia Mother    Hypertension Mother    Cancer Father        skin   Hyperlipidemia Father    Dementia Father    Arthritis Father    Hypertension Father    Sleep apnea Father    Other Sister        Myelofibrosis   Arthritis Sister    Hyperlipidemia Sister    Arthritis Sister    Hyperlipidemia Sister    Diabetes Paternal Grandfather    Colon cancer Neg Hx     Objective: Office vital signs reviewed. LMP 12/31/2013   Physical Examination:  General: Awake, alert, well nourished, No acute distress MSK: gait mildly antalgic  Assessment/ Plan: 59 y.o. female   Primary osteoarthritis of left hip - Plan: HYDROcodone-acetaminophen (NORCO) 5-325 MG tablet  Ongoing pain despite recent corticosteroid injection and 2 rounds of oral steroids.  Hesitate to put her on any additional steroids and that this does help.  I am going to give her a short course of opioids to have on hand.  Caution sedation.  Use Colace.  Advised her to follow-up with her orthopedist ASAP as anticipated will want  proceed with MRI prior to her next visit.  The Narcotic Database has been reviewed.  There were no red flags.     No orders of the defined types were placed in this encounter.  Meds ordered this  encounter  Medications   HYDROcodone-acetaminophen (NORCO) 5-325 MG tablet    Sig: Take 1 tablet by mouth every 6 (six) hours as needed for up to 5 days for moderate pain.    Dispense:  20 tablet    Refill:  [place], DO [place] [phone]

## 2021-05-06 ENCOUNTER — Other Ambulatory Visit (HOSPITAL_COMMUNITY): Payer: Self-pay

## 2021-05-06 MED ORDER — TRETINOIN 0.05 % EX CREA
TOPICAL_CREAM | CUTANEOUS | 11 refills | Status: DC
  Filled 2021-05-06: qty 20, 30d supply, fill #0

## 2021-05-07 ENCOUNTER — Other Ambulatory Visit (HOSPITAL_COMMUNITY): Payer: Self-pay

## 2021-05-08 ENCOUNTER — Other Ambulatory Visit (HOSPITAL_COMMUNITY): Payer: Self-pay

## 2021-05-10 IMAGING — CT CT IMAGE GUIDED DRAINAGE BY PERCUTANEOUS CATHETER
1 of 2 series · 13 of 32 positions shown, 18 images · non-contrast
Comparison: none

INDICATION: 58-year-old female with acute sigmoid colonic diverticulitis
complicated by intramural abscess formation. She presents for
CT-guided drain placement.

[Series 2: i-spiral 5.0 b40f · axial · 0.97mm/px · z∈[-648,-484]mm · 13 of 53 slices shown, 18 images]
[im 3/53  soft-tissue]
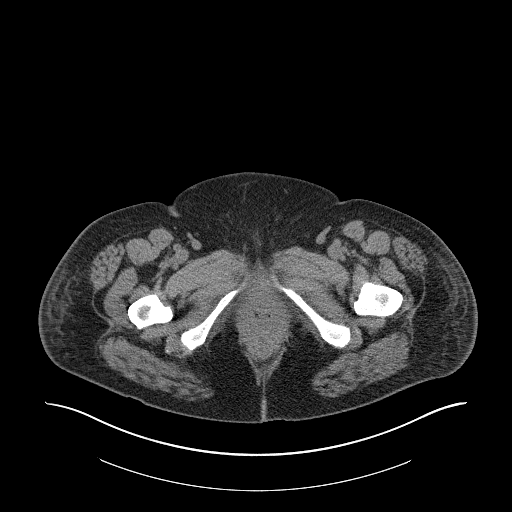
[im 3/53  bone]
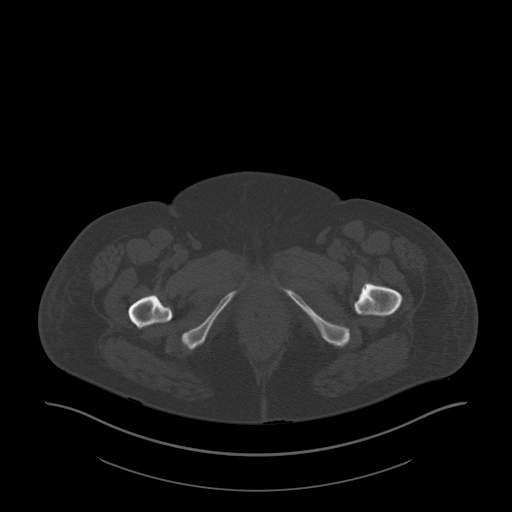
[im 9/53  soft-tissue]
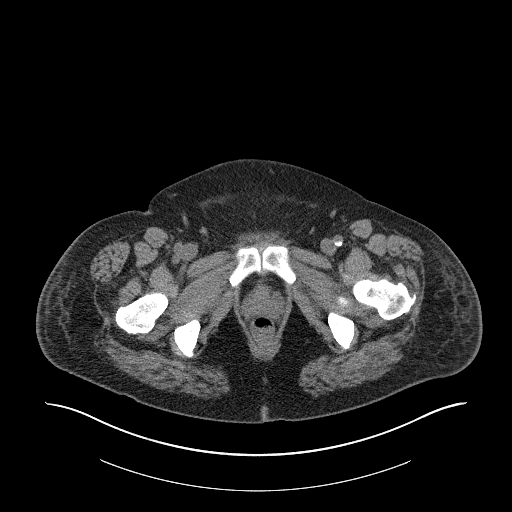
[im 11/53  soft-tissue]
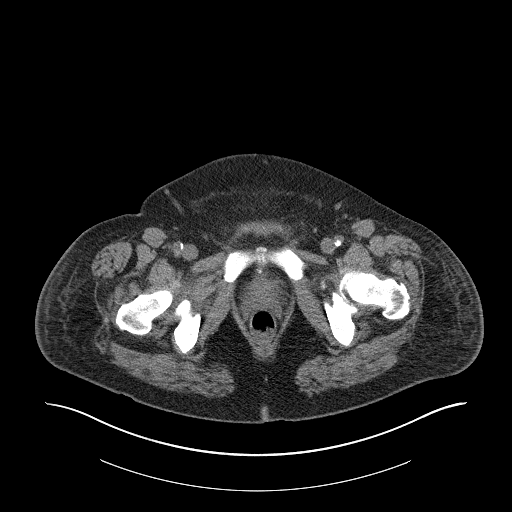
[im 17/53  soft-tissue]
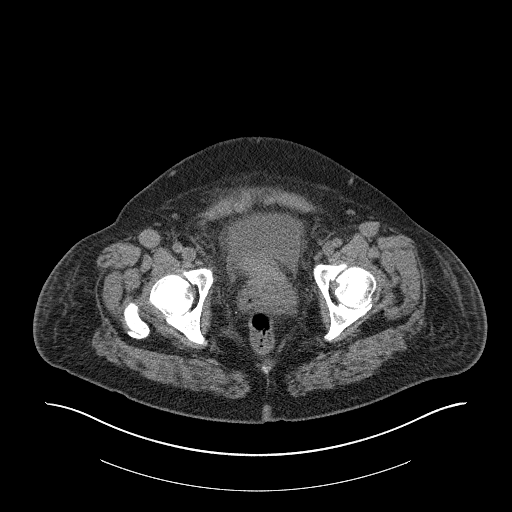
[im 20/53  soft-tissue]
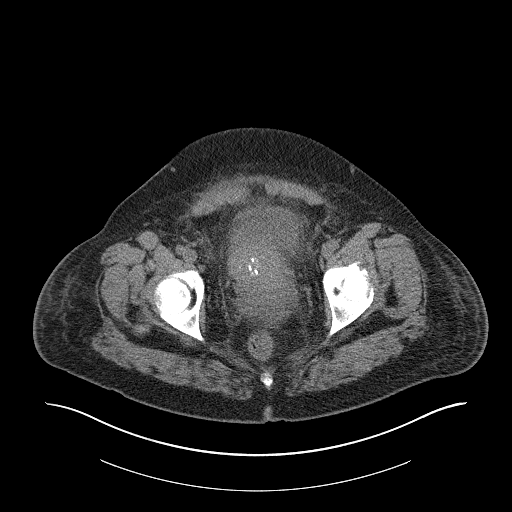
[im 25/53  soft-tissue]
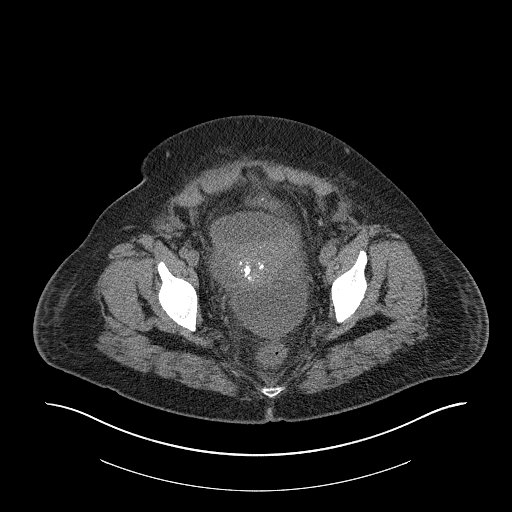
[im 28/53  soft-tissue]
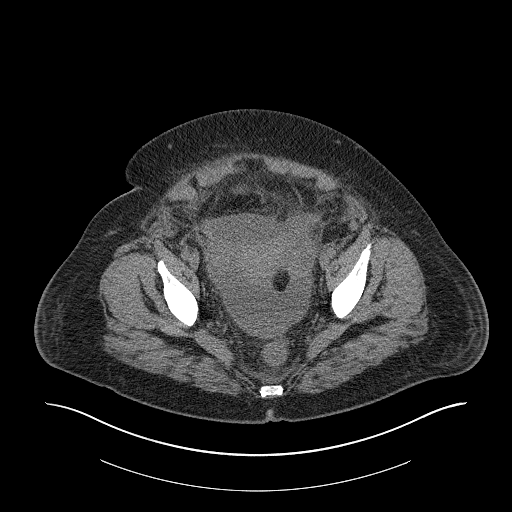
[im 33/53  soft-tissue]
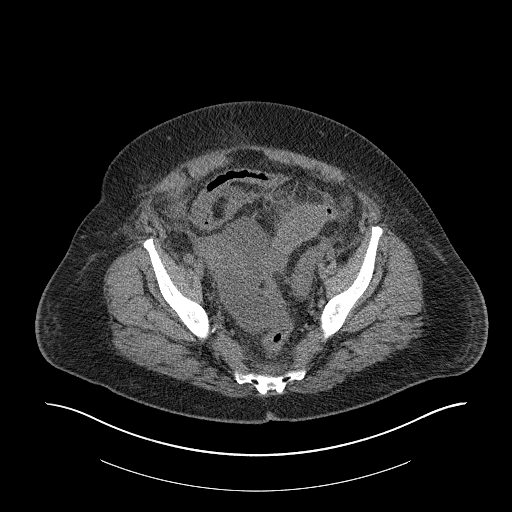
[im 36/53  soft-tissue]
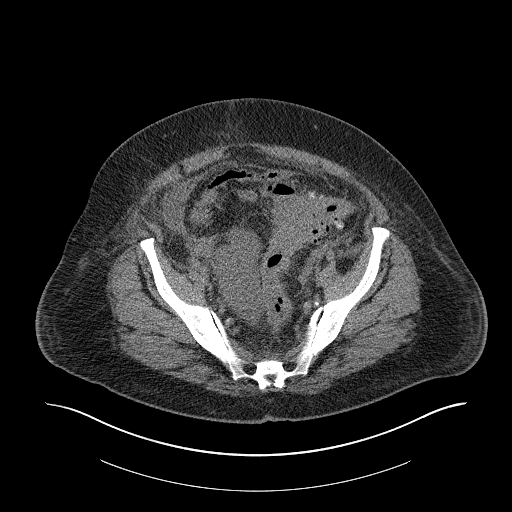
[im 36/53  bone]
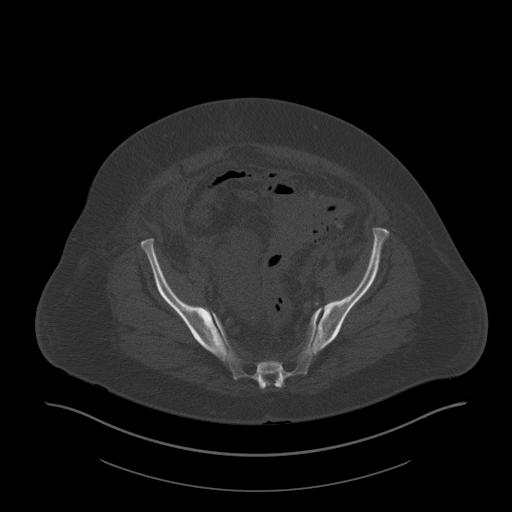
[im 42/53  soft-tissue]
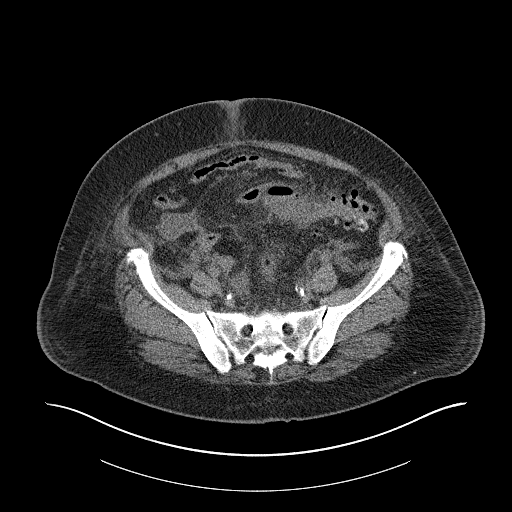
[im 42/53  lung]
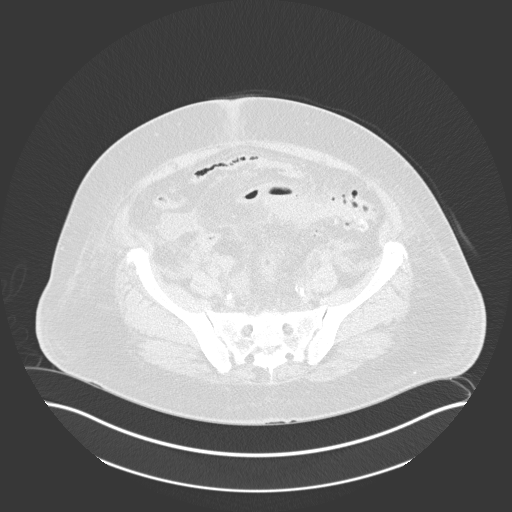
[im 44/53  soft-tissue]
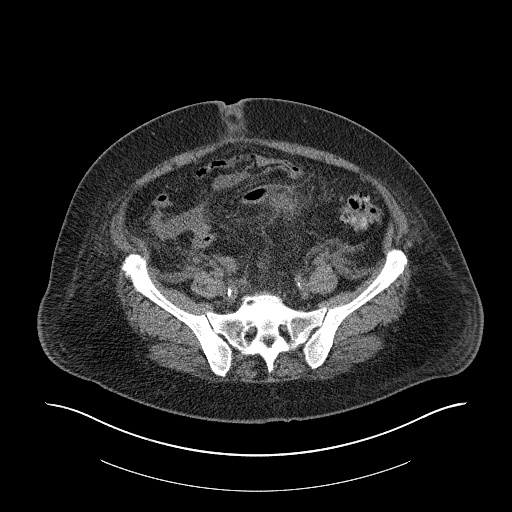
[im 44/53  lung]
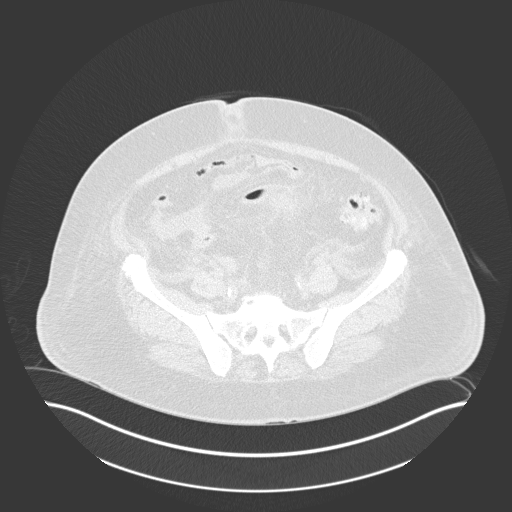
[im 47/53  lung]
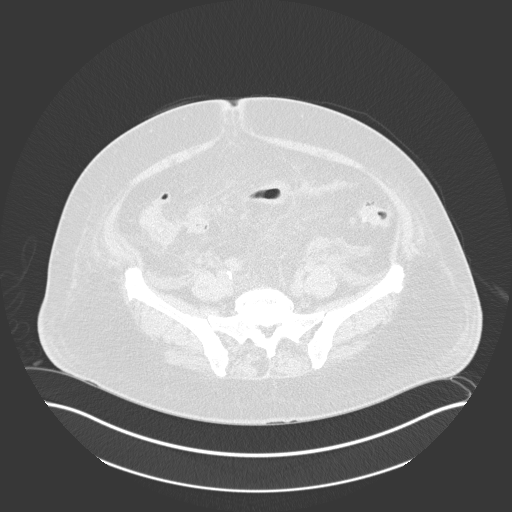
[im 50/53  soft-tissue]
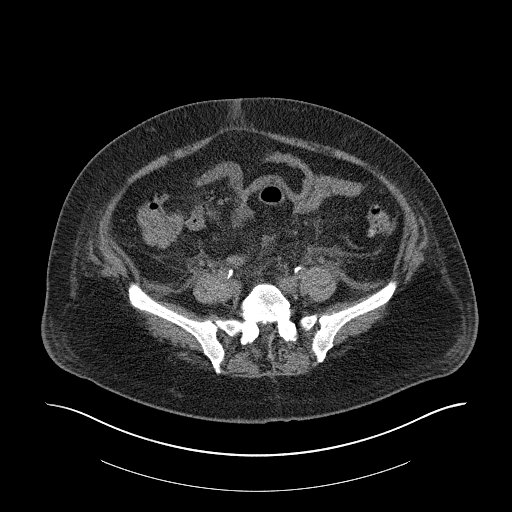
[im 50/53  lung]
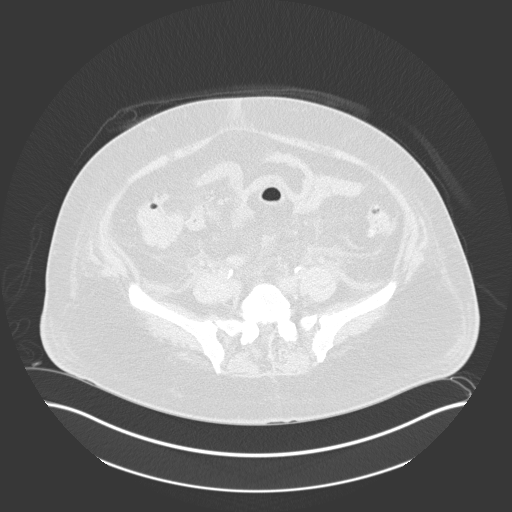

[13 of 32 positions shown; findings below may reference images not displayed]

EXAM:
CT-guided drain placement

MEDICATIONS:
The patient is currently admitted to the hospital and receiving
intravenous antibiotics. The antibiotics were administered within an
appropriate time frame prior to the initiation of the procedure.

ANESTHESIA/SEDATION:
Fentanyl 100 mcg IV; Versed 2 mg IV

Moderate Sedation Time:  25 minutes

The patient was continuously monitored during the procedure by the
interventional radiology nurse under my direct supervision.

COMPLICATIONS:
None immediate.

PROCEDURE:
Informed written consent was obtained from the patient after a
thorough discussion of the procedural risks, benefits and
alternatives. All questions were addressed. Maximal Sterile Barrier
Technique was utilized including caps, mask, sterile gowns, sterile
gloves, sterile drape, hand hygiene and skin antiseptic. A timeout
was performed prior to the initiation of the procedure.

A planning axial CT scan was performed. The fluid and gas collection
along the mesenteric border of the sigmoid colon was successfully
identified. A suitable skin entry site was selected and marked. The
overlying skin was sterilely prepped and draped in the standard
fashion using chlorhexidine skin prep. Local anesthesia was attained
by infiltration with 1% lidocaine. A small dermatotomy was made.
Under intermittent CT guidance, an 18 gauge trocar needle was
carefully advanced into the fluid and gas collection. An Amplatz
wire was then coiled in the collection. The skin tract was dilated
to 12 French. [REDACTED] French all-purpose drainage catheter was
then advanced over the wire and formed.

Aspiration yields approximately 20 mL thick purulent fluid. A sample
was sent for Gram stain and culture. The drain was then secured to
the skin with 0 Prolene suture and sterile bandages.
IMPRESSION: Successful placement of a 12 French drainage catheter into the
intramural abscess along the mesenteric border of the sigmoid wall.
Aspiration yields approximately 20 mL purulent fluid.

PLAN:
1. Maintain drain to JP bulb suction.
2. Flush at least once per shift.
3. Follow-up in drain clinic in 2 weeks with repeat CT scan of the
pelvis with intravenous contrast and drain injection.

## 2021-05-11 ENCOUNTER — Encounter: Payer: Self-pay | Admitting: Family Medicine

## 2021-05-12 ENCOUNTER — Other Ambulatory Visit: Payer: Self-pay

## 2021-05-12 ENCOUNTER — Other Ambulatory Visit: Payer: Self-pay | Admitting: Family Medicine

## 2021-05-12 ENCOUNTER — Ambulatory Visit (INDEPENDENT_AMBULATORY_CARE_PROVIDER_SITE_OTHER): Payer: No Typology Code available for payment source | Admitting: Family Medicine

## 2021-05-12 ENCOUNTER — Encounter: Payer: Self-pay | Admitting: Family Medicine

## 2021-05-12 ENCOUNTER — Other Ambulatory Visit (HOSPITAL_COMMUNITY): Payer: Self-pay

## 2021-05-12 VITALS — BP 143/81 | HR 77 | Temp 98.3°F | Wt 175.0 lb

## 2021-05-12 DIAGNOSIS — Z4802 Encounter for removal of sutures: Secondary | ICD-10-CM

## 2021-05-12 MED ORDER — DULOXETINE HCL 60 MG PO CPEP
60.0000 mg | ORAL_CAPSULE | Freq: Every day | ORAL | 3 refills | Status: DC
  Filled 2021-05-12: qty 90, 90d supply, fill #0
  Filled 2021-08-10: qty 90, 90d supply, fill #1

## 2021-05-12 NOTE — Progress Notes (Signed)
Subjective:  Patient ID: Heather Lam, female    DOB: [birth year], 60 y.o.   MRN: [id number]  Patient Care Team: [name], DO as PCP - General (Family Medicine) Belva Crome, MD as PCP - Cardiology (Cardiology) Lavera Guise, [facility] (Pharmacist)   Chief Complaint:  Suture / Staple Removal   HPI: Heather Lam is a 60 y.o. female presenting on 05/12/2021 for Suture / Staple Removal    1. Visit for suture removal Pt presents today for suture removal. She had a place removed from her left nasolabial fold last Tuesday by dermatology. No redness, drainage, or bleeding. One running suture noted.      Relevant past medical, surgical, family, and social history reviewed and updated as indicated.  Allergies and medications reviewed and updated. Data reviewed: Chart in Epic.   Past Medical History:  Diagnosis Date   Anxiety    Back pain    CAD (coronary artery disease)    Chest pain    Complication of anesthesia    Constipation    Coronary arteriosclerosis 05/04/2016   Diverticulitis    Diverticulosis    Family history of adverse reaction to anesthesia    Mom PONV   Heart attack ([place]) 2010   History of carpal tunnel syndrome    Bilateral   History of kidney stones    Hyperlipidemia    Hypertension    Intra-abdominal abscess ([place])    Joint pain    Migraines    Morbid obesity ([place])    Myocardial infarction ([place]) 05/04/2009   PONV (postoperative nausea and vomiting)    Pre-diabetes    pt. denies . It is noted in pt. chart   Skin cancer    on nose   Sleep apnea    not using cpap at this time   Vitamin D deficiency     Past Surgical History:  Procedure Laterality Date   APPENDECTOMY     APPLICATION OF WOUND VAC N/A 02/09/2019   Procedure: APPLICATION OF WOUND VAC;  Surgeon: Clovis Riley, MD;  Location: [facility];  Service: General;  Laterality: N/A;   BACK SURGERY     CARPAL TUNNEL RELEASE Right    CARPAL TUNNEL RELEASE Left 08/13/2016   Procedure:  LEFT CARPAL TUNNEL RELEASE;  Surgeon: Roseanne Kaufman, MD;  Location: [facility];  Service: Orthopedics;  Laterality: Left;   COLECTOMY WITH COLOSTOMY CREATION/HARTMANN PROCEDURE N/A 02/09/2019   Procedure: HARTMANN PROCEDURE;  Surgeon: Clovis Riley, MD;  Location: [facility];  Service: General;  Laterality: N/A;   COLONOSCOPY     COLOSTOMY N/A 02/09/2019   Procedure: COLOSTOMY;  Surgeon: Clovis Riley, MD;  Location: [facility];  Service: General;  Laterality: N/A;   COLOSTOMY REVERSAL N/A 07/30/2019   Procedure: LAPAROSCOPIC ASSISTED REVERSAL OF END COLOSTOMY WITH RIGID PROCTOSCOPY;  Surgeon: Clovis Riley, MD;  Location[facility];  Service: General;  Laterality: N/A;   CORONARY STENT PLACEMENT     2 stents in the same artery   ENDOMETRIAL ABLATION  2005   [place] LITHOTRIPSY Right 06/27/2017   Procedure: RIGHT EXTRACORPOREAL SHOCK WAVE LITHOTRIPSY (ESWL);  Surgeon: Franchot Gallo, MD;  Location: [facility];  Service: Urology;  Laterality: Right;   INCISIONAL HERNIA REPAIR N/A 10/10/2020   Procedure: LAPAROSCOPIC  WITH TRASITION TO OPEN INCISIONAL HERNIA REPAIR WITH MESH;  Surgeon: Clovis Riley, MD;  Location: [facility];  Service: General;  Laterality: N/A;   KNEE ARTHROSCOPY  WITH MENISCAL REPAIR Right    MOHS SURGERY     TUBAL LIGATION  1991    Social History   Socioeconomic History   Marital status: Single    Spouse name: Not on file   Number of children: Not on file   Years of education: Not on file   Highest education level: Not on file  Occupational History   Occupation: Nurse    Employer: [employer]    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   Smoking status: Former    Packs/day: 1.00    Years: 20.00    Pack years: 20.00    Types: Cigarettes    Quit date: 2010    Years since quitting: 12.7   Smokeless tobacco: Never   Tobacco comments:    started back and quit again 6 months ago  Vaping Use   Vaping Use:  Never used  Substance and Sexual Activity   Alcohol use: Not Currently    Comment: Rare    Drug use: No   Sexual activity: Yes    Birth control/protection: Post-menopausal  Other Topics Concern   Not on file  Social History Narrative   Caffeine daily    Social Determinants of Health   Financial Resource Strain: Not on file  Food Insecurity: Not on file  Transportation Needs: Not on file  Physical Activity: Not on file  Stress: Not on file  Social Connections: Not on file  Intimate Partner Violence: Not on file    Outpatient Encounter Medications as of 05/12/2021  Medication Sig   calcium carbonate (OSCAL) 1500 (600 Ca) MG TABS tablet Take 600 mg of elemental calcium by mouth daily with breakfast.   Cholecalciferol 125 MCG (5000 UT) TABS Take by mouth.   Coenzyme Q10 100 MG TABS Take by mouth.   docusate sodium (COLACE) 100 MG capsule Take 200 mg by mouth daily.   DULoxetine (CYMBALTA) 60 MG capsule Take 1 capsule (60 mg total) by mouth daily.   icosapent Ethyl (VASCEPA) 1 g capsule TAKE 2 CAPULES BY MOUTH TWICE DAILY   losartan-hydrochlorothiazide (HYZAAR) 100-25 MG tablet TAKE 1 TABLET BY MOUTH DAILY.   metoprolol tartrate (LOPRESSOR) 25 MG tablet TAKE 1 TABLET BY MOUTH 2 TIMES DAILY   Multiple Vitamin (MULTIVITAMIN) tablet Take 1 tablet by mouth daily.   nitroGLYCERIN (NITROSTAT) 0.4 MG SL tablet 1 TABLET UNDER TONGUE EVERY 5 MINUTES UP TO 3 TIMES FOR CHEST PAIN THEN CALL DR IF NO RELIEF   rosuvastatin (CRESTOR) 20 MG tablet Take 1 tablet (20 mg total) by mouth 4 (four) times a week.   Semaglutide,0.25 or 0.5MG /DOS, (OZEMPIC, 0.25 OR 0.5 MG/DOSE,) 2 MG/1.5ML SOPN Inject 0.25 mg into the skin once a week for 14 days, THEN 0.5 mg once a week.   traZODone (DESYREL) 50 MG tablet TAKE 1 TO 2 TABLETS BY MOUTH AT BEDTIME AS NEEDED FOR SLEEP   tretinoin (RETIN-A) 0.05 % cream Apply pea size amount to affected regions 2-3 times a week building up to nightly.   varenicline (CHANTIX) 1  MG tablet TAKE  (1)  TABLET TWICE A DAY.   [DISCONTINUED] DULoxetine (CYMBALTA) 30 MG capsule Take 1 capsule (30 mg total) by mouth daily for 6 weeks.  Then switch to every other day until gone.   [DISCONTINUED] fluconazole (DIFLUCAN) 150 MG tablet Take one tablet my mouth now. Repeat after completion of antibiotics.   [DISCONTINUED] gabapentin (NEURONTIN) 300 MG capsule Take 1 capsule (300 mg total) by  mouth every 8 (eight) hours as needed.   [DISCONTINUED] phenazopyridine (PYRIDIUM) 100 MG tablet Take 1 tablet (100 mg total) by mouth 3 (three) times daily as needed for pain. (Patient not taking: Reported on 04/09/2021)   No facility-administered encounter medications on file as of 05/12/2021.    No Known Allergies  Review of Systems  Skin:  Positive for wound (surgical wound, suture in place). Negative for color change, pallor and rash.  All other systems reviewed and are negative.      Objective:  BP (!) 143/81   Pulse 77   Temp 98.3 F (36.8 C)   Wt 175 lb (79.4 kg)   LMP 12/31/2013   SpO2 96%   BMI 32.01 kg/m    Wt Readings from Last 3 Encounters:  05/12/21 175 lb (79.4 kg)  04/09/21 179 lb (81.2 kg)  03/17/21 181 lb (82.1 kg)    Physical Exam Vitals and nursing note reviewed.  Constitutional:      General: She is not in acute distress.    Appearance: Normal appearance. She is well-developed and well-groomed. She is not ill-appearing, toxic-appearing or diaphoretic.  HENT:     Head: Normocephalic and atraumatic.     Jaw: There is normal jaw occlusion.     Right Ear: Hearing normal.     Left Ear: Hearing normal.     Nose: Nose normal.     Mouth/Throat:     Lips: Pink.     Mouth: Mucous membranes are moist.     Pharynx: Uvula midline.  Eyes:     General: Lids are normal.     Conjunctiva/sclera: Conjunctivae normal.     Pupils: Pupils are equal, round, and reactive to light.  Neck:     Thyroid: No thyroid mass, thyromegaly or thyroid tenderness.     Vascular: No  carotid bruit or JVD.     Trachea: Trachea and phonation normal.  Cardiovascular:     Rate and Rhythm: Normal rate.     Chest Wall: PMI is not displaced.     Heart sounds: No murmur heard.   No friction rub. No gallop.  Pulmonary:     Effort: Pulmonary effort is normal. No respiratory distress.     Breath sounds: No wheezing.  Abdominal:     General: There is no abdominal bruit.     Palpations: There is no hepatomegaly or splenomegaly.  Musculoskeletal:     Right lower leg: No edema.     Left lower leg: No edema.  Lymphadenopathy:     Cervical: No cervical adenopathy.  Skin:    General: Skin is warm and dry.     Capillary Refill: Capillary refill takes less than 2 seconds.     Coloration: Skin is not cyanotic, jaundiced or pale.     Findings: No rash.       Neurological:     General: No focal deficit present.     Mental Status: She is alert and oriented to person, place, and time.     Cranial Nerves: Cranial nerves are intact.     Sensory: Sensation is intact.     Motor: Motor function is intact.     Coordination: Coordination is intact.     Gait: Gait is intact.     Deep Tendon Reflexes: Reflexes are normal and symmetric.  Psychiatric:        Attention and Perception: Attention and perception normal.        Mood and Affect: Mood and affect normal.  Speech: Speech normal.        Behavior: Behavior normal. Behavior is cooperative.        Thought Content: Thought content normal.        Cognition and Memory: Cognition and memory normal.        Judgment: Judgment normal.    Results for orders placed or performed in visit on 04/09/21  Microscopic Examination   Urine  Result Value Ref Range   WBC, UA 6-10 (A) 0 - 5 /hpf   RBC 3-10 (A) 0 - 2 /hpf   Epithelial Cells (non renal) 0-10 0 - 10 /hpf   Renal Epithel, UA None seen None seen /hpf   Bacteria, UA Many (A) None seen/Few   Yeast, UA Present (A) None seen  Urinalysis, Complete  Result Value Ref Range    Specific Gravity, UA 1.015 1.005 - 1.030   pH, UA 5.5 5.0 - 7.5   Color, UA Yellow Yellow   Appearance Ur Clear Clear   Leukocytes,UA Trace (A) Negative   Protein,UA Trace (A) Negative/Trace   Glucose, UA Negative Negative   Ketones, UA Negative Negative   RBC, UA 2+ (A) Negative   Bilirubin, UA Negative Negative   Urobilinogen, Ur 0.2 0.2 - 1.0 mg/dL   Nitrite, UA Positive (A) Negative   Microscopic Examination See below:     Suture Removal  Date/Time: 05/12/2021 8:57 AM Performed by: Baruch Gouty, FNP Authorized by: Baruch Gouty, FNP  Body area: head/neck Location details: left cheek Wound Appearance: clean, moist and warm Sutures Removed: 1 Post-removal: antibiotic ointment applied Patient tolerance: patient tolerated the procedure well with no immediate complications     Pertinent labs & imaging results that were available during my care of the patient were reviewed by me and considered in my medical decision making.  Assessment & Plan:  Ameerah was seen today for suture / staple removal.  Diagnoses and all orders for this visit:  Visit for suture removal Wound care discussed in detail.  -     Suture Removal    Continue all other maintenance medications.  Follow up plan: Return if symptoms worsen or fail to improve.   Continue healthy lifestyle choices, including diet (rich in fruits, vegetables, and lean proteins, and low in salt and simple carbohydrates) and exercise (at least 30 minutes of moderate physical activity daily).  Educational handout given for wound care   The above assessment and management plan was discussed with the patient. The patient verbalized understanding of and has agreed to the management plan. Patient is aware to call the clinic if they develop any new symptoms or if symptoms persist or worsen. Patient is aware when to return to the clinic for a follow-up visit. Patient educated on when it is appropriate to go to the emergency  department.   Monia Pouch, FNP-C [facility] [phone]

## 2021-05-14 ENCOUNTER — Other Ambulatory Visit (HOSPITAL_COMMUNITY): Payer: Self-pay

## 2021-06-09 ENCOUNTER — Other Ambulatory Visit: Payer: Self-pay | Admitting: Family Medicine

## 2021-06-09 ENCOUNTER — Telehealth: Payer: Self-pay | Admitting: *Deleted

## 2021-06-09 DIAGNOSIS — G4733 Obstructive sleep apnea (adult) (pediatric): Secondary | ICD-10-CM

## 2021-06-09 DIAGNOSIS — I252 Old myocardial infarction: Secondary | ICD-10-CM

## 2021-06-09 DIAGNOSIS — Z01818 Encounter for other preprocedural examination: Secondary | ICD-10-CM

## 2021-06-09 DIAGNOSIS — I2510 Atherosclerotic heart disease of native coronary artery without angina pectoris: Secondary | ICD-10-CM

## 2021-06-09 NOTE — Telephone Encounter (Signed)
   [facility] Pre-operative Risk Assessment    Patient Name: Heather Lam  DOB: [birth year] MRN: [id number]  HEARTCARE STAFF:  - IMPORTANT!!!!!! Under Visit Info/Reason for Call, type in Other and utilize the format Clearance MM/DD/YY or Clearance TBD. Do not use dashes or single digits. - Please review there is not already an duplicate clearance open for this procedure. - If request is for dental extraction, please clarify the # of teeth to be extracted. - If the patient is currently at the dentist's office, call Pre-Op Callback Staff (MA/nurse) to input urgent request.  - If the patient is not currently in the dentist office, please route to the Pre-Op pool.  Request for surgical clearance:  What type of surgery is being performed?  LEFT TOTAL HIP ARTHROPLASTY  When is this surgery scheduled?  09/02/21  What type of clearance is required (medical clearance vs. Pharmacy clearance to hold med vs. Both)?  MEDICAL  Are there any medications that need to be held prior to surgery and how long?  N/A  Practice name and name of physician performing surgery?  [facility] / DR Elmyra Ricks  What is the office phone number?  [phone]   7.   What is the office fax number?  [phone] ATTN:  KELLY  8.   Anesthesia type (None, local, MAC, general) ?   CHOICE   Jeanann Lewandowsky 06/09/2021, 4:27 PM  _________________________________________________________________   (provider comments below)

## 2021-06-10 ENCOUNTER — Other Ambulatory Visit: Payer: No Typology Code available for payment source

## 2021-06-10 DIAGNOSIS — I2510 Atherosclerotic heart disease of native coronary artery without angina pectoris: Secondary | ICD-10-CM

## 2021-06-10 DIAGNOSIS — Z01818 Encounter for other preprocedural examination: Secondary | ICD-10-CM

## 2021-06-10 DIAGNOSIS — Z9861 Coronary angioplasty status: Secondary | ICD-10-CM

## 2021-06-10 DIAGNOSIS — I252 Old myocardial infarction: Secondary | ICD-10-CM

## 2021-06-10 LAB — URINALYSIS, ROUTINE W REFLEX MICROSCOPIC
Bilirubin, UA: NEGATIVE
Glucose, UA: NEGATIVE
Ketones, UA: NEGATIVE
Leukocytes,UA: NEGATIVE
Nitrite, UA: NEGATIVE
Specific Gravity, UA: >1.03 — ABNORMAL HIGH (ref 1.005–1.030)
Urobilinogen, Ur: 0.2 mg/dL (ref 0.2–1.0)
pH, UA: 5.5 (ref 5.0–7.5)

## 2021-06-10 LAB — MICROSCOPIC EXAMINATION: Renal Epithel, UA: NONE SEEN /hpf

## 2021-06-10 LAB — BAYER DCA HB A1C WAIVED: HB A1C (BAYER DCA - WAIVED): 5.7 % — ABNORMAL HIGH (ref 4.8–5.6)

## 2021-06-10 NOTE — Telephone Encounter (Signed)
Patient already has a pre-op visit scheduled with Ermalinda Barrios on 07/07/2021. Will route clearance form to her so that she is aware and will remove from pre-op pool.  Darreld Mclean, PA-C 06/10/2021 8:37 AM

## 2021-06-11 LAB — CBC
Hematocrit: 43.3 % (ref 34.0–46.6)
Hemoglobin: 14.7 g/dL (ref 11.1–15.9)
MCH: 29.1 pg (ref 26.6–33.0)
MCHC: 33.9 g/dL (ref 31.5–35.7)
MCV: 86 fL (ref 79–97)
Platelets: 253 10*3/uL (ref 150–450)
RBC: 5.05 x10E6/uL (ref 3.77–5.28)
RDW: 13.4 % (ref 11.7–15.4)
WBC: 10.4 10*3/uL (ref 3.4–10.8)

## 2021-06-11 LAB — CMP14+EGFR
ALT: 15 IU/L (ref 0–32)
AST: 15 IU/L (ref 0–40)
Albumin/Globulin Ratio: 2.1 (ref 1.2–2.2)
Albumin: 4.5 g/dL (ref 3.8–4.9)
Alkaline Phosphatase: 78 IU/L (ref 44–121)
BUN/Creatinine Ratio: 23 (ref 12–28)
BUN: 14 mg/dL (ref 8–27)
Bilirubin Total: 0.2 mg/dL (ref 0.0–1.2)
CO2: 25 mmol/L (ref 20–29)
Calcium: 9.6 mg/dL (ref 8.7–10.3)
Chloride: 99 mmol/L (ref 96–106)
Creatinine, Ser: 0.61 mg/dL (ref 0.57–1.00)
Globulin, Total: 2.1 g/dL (ref 1.5–4.5)
Glucose: 101 mg/dL — ABNORMAL HIGH (ref 70–99)
Potassium: 3.9 mmol/L (ref 3.5–5.2)
Sodium: 140 mmol/L (ref 134–144)
Total Protein: 6.6 g/dL (ref 6.0–8.5)
eGFR: 102 mL/min/{1.73_m2} (ref >59)

## 2021-06-11 LAB — PROTIME-INR
INR: 1 (ref 0.9–1.2)
Prothrombin Time: 10.1 s (ref 9.1–12.0)

## 2021-06-14 LAB — URINE CULTURE

## 2021-06-30 ENCOUNTER — Encounter: Payer: Self-pay | Admitting: Family Medicine

## 2021-06-30 ENCOUNTER — Other Ambulatory Visit (HOSPITAL_COMMUNITY): Payer: Self-pay

## 2021-06-30 ENCOUNTER — Ambulatory Visit (INDEPENDENT_AMBULATORY_CARE_PROVIDER_SITE_OTHER): Payer: No Typology Code available for payment source | Admitting: Family Medicine

## 2021-06-30 VITALS — BP 133/73 | HR 66 | Temp 97.3°F | Ht 62.0 in | Wt 175.0 lb

## 2021-06-30 DIAGNOSIS — M25559 Pain in unspecified hip: Secondary | ICD-10-CM | POA: Diagnosis not present

## 2021-06-30 DIAGNOSIS — M1612 Unilateral primary osteoarthritis, left hip: Secondary | ICD-10-CM

## 2021-06-30 MED ORDER — HYDROCODONE-ACETAMINOPHEN 5-325 MG PO TABS: 1.0000 | ORAL_TABLET | Freq: Four times a day (QID) | ORAL | 0 refills | Status: DC | PRN

## 2021-06-30 NOTE — Progress Notes (Signed)
Cardiology Office Note    Date:  07/07/2021   ID:  Heather Lam, DOB 01-16-1961, MRN [id number]   PCP:  [name] DO   [place]  Cardiologist:  Sinclair Grooms, MD   Advanced Practice Provider:  No care team member to display Electrophysiologist:  None   [phone]   Chief Complaint  Patient presents with   Pre-op Exam    History of Present Illness:  Heather Lam is a 60 y.o. female  with a hx of coronary artery disease with prior acute inferior infarction 2010 stent to RCA and intermediate stenosis in the mid LAD treated medically.  2012 nuclear study negative for ischemia. Other problems include hyperlipidemia, hypertension, and anxiety disorder.  nuc study 2017 normal study.   Patient last saw Dr. Tamala Julian 10/2020 and started smoking again. Stopped vascepa b/c of pill size.  She's on my schedule for Preoperative clearance for left total hip arthroplasty 09/02/2021 by Dr. Elmyra Ricks.  Patient denies chest pain, dyspnea, edema. Not as active since hip pain. Hasn't been able to hike. Quit smoking again. BP up today but she checks it regularly and it's usually 120/70. Works as Marine scientist at [employer].      Past Medical History:  Diagnosis Date   Anxiety    Back pain    CAD (coronary artery disease)    Chest pain    Complication of anesthesia    Constipation    Coronary arteriosclerosis 05/04/2016   Diverticulitis    Diverticulosis    Family history of adverse reaction to anesthesia    Mom PONV   Heart attack ([place]) 2010   History of carpal tunnel syndrome    Bilateral   History of kidney stones    Hyperlipidemia    Hypertension    Intra-abdominal abscess ([place])    Joint pain    Migraines    Morbid obesity ([place])    Myocardial infarction ([place]) 05/04/2009   PONV (postoperative nausea and vomiting)    Pre-diabetes    pt. denies . It is noted in pt. chart   Skin cancer    on nose   Sleep apnea    not using  cpap at this time   Vitamin D deficiency     Past Surgical History:  Procedure Laterality Date   APPENDECTOMY     APPLICATION OF WOUND VAC N/A 02/09/2019   Procedure: APPLICATION OF WOUND VAC;  Surgeon: Clovis Riley, MD;  Location: [facility];  Service: General;  Laterality: N/A;   BACK SURGERY     CARPAL TUNNEL RELEASE Right    CARPAL TUNNEL RELEASE Left 08/13/2016   Procedure: LEFT CARPAL TUNNEL RELEASE;  Surgeon: Roseanne Kaufman, MD;  Location: [facility];  Service: Orthopedics;  Laterality: Left;   COLECTOMY WITH COLOSTOMY CREATION/HARTMANN PROCEDURE N/A 02/09/2019   Procedure: HARTMANN PROCEDURE;  Surgeon: Clovis Riley, MD;  Location: [facility];  Service: General;  Laterality: N/A;   COLONOSCOPY     COLOSTOMY N/A 02/09/2019   Procedure: COLOSTOMY;  Surgeon: Clovis Riley, MD;  Location: [facility];  Service: General;  Laterality: N/A;   COLOSTOMY REVERSAL N/A 07/30/2019   Procedure: LAPAROSCOPIC ASSISTED REVERSAL OF END COLOSTOMY WITH RIGID PROCTOSCOPY;  Surgeon: Clovis Riley, MD;  Location: [facility];  Service: General;  Laterality: N/A;   CORONARY STENT PLACEMENT     2 stents in the same artery   ENDOMETRIAL ABLATION  2005  EXTRACORPOREAL SHOCK WAVE LITHOTRIPSY Right 06/27/2017   Procedure: RIGHT EXTRACORPOREAL SHOCK WAVE LITHOTRIPSY (ESWL);  Surgeon: Franchot Gallo, MD;  Location: [facility];  Service: Urology;  Laterality: Right;   INCISIONAL HERNIA REPAIR N/A 10/10/2020   Procedure: LAPAROSCOPIC  WITH TRASITION TO OPEN INCISIONAL HERNIA REPAIR WITH MESH;  Surgeon: Clovis Riley, MD;  Location[facility];  Service: General;  Laterality: N/A;   KNEE ARTHROSCOPY WITH MENISCAL REPAIR Right    MOHS SURGERY     TUBAL LIGATION  1991    Current Medications: Current Meds  Medication Sig   aspirin EC 81 MG tablet Take 81 mg by mouth daily. Swallow whole.   calcium carbonate (OSCAL) 1500 (600 Ca) MG TABS tablet Take 600 mg of elemental calcium by mouth daily with  breakfast.   Cholecalciferol 125 MCG (5000 UT) TABS Take by mouth.   Coenzyme Q10 100 MG TABS Take by mouth.   docusate sodium (COLACE) 100 MG capsule Take 200 mg by mouth daily.   DULoxetine (CYMBALTA) 60 MG capsule Take 1 capsule (60 mg total) by mouth daily.   HYDROcodone-acetaminophen (NORCO) 5-325 MG tablet Take 1 tablet by mouth every 6 (six) hours as needed for moderate pain.   icosapent Ethyl (VASCEPA) 1 g capsule TAKE 2 CAPULES BY MOUTH TWICE DAILY   losartan-hydrochlorothiazide (HYZAAR) 100-25 MG tablet TAKE 1 TABLET BY MOUTH DAILY.   metoprolol tartrate (LOPRESSOR) 25 MG tablet TAKE 1 TABLET BY MOUTH 2 TIMES DAILY   Multiple Vitamin (MULTIVITAMIN) tablet Take 1 tablet by mouth daily.   nitroGLYCERIN (NITROSTAT) 0.4 MG SL tablet 1 TABLET UNDER TONGUE EVERY 5 MINUTES UP TO 3 TIMES FOR CHEST PAIN THEN CALL DR IF NO RELIEF   rosuvastatin (CRESTOR) 20 MG tablet Take 1 tablet (20 mg total) by mouth 4 (four) times a week.   Semaglutide,0.25 or 0.5MG /DOS, (OZEMPIC, 0.25 OR 0.5 MG/DOSE,) 2 MG/1.5ML SOPN Inject 0.25 mg into the skin once a week for 14 days, THEN 0.5 mg once a week.   traZODone (DESYREL) 50 MG tablet TAKE 1 TO 2 TABLETS BY MOUTH AT BEDTIME AS NEEDED FOR SLEEP   tretinoin (RETIN-A) 0.05 % cream Apply pea size amount to affected regions 2-3 times a week building up to nightly.   [DISCONTINUED] Aspirin 325 MG CAPS Take 325 mg by mouth daily.     Allergies:   Patient has no known allergies.   Social History   Socioeconomic History   Marital status: Single    Spouse name: Not on file   Number of children: Not on file   Years of education: Not on file   Highest education level: Not on file  Occupational History   Occupation: Nurse    Employer: [employer]    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[employer]   Smoking status: Former    Packs/day: 1.00    Years: 20.00    Pack years: 20.00    Types: Cigarettes    Quit date: 2010    Years since  quitting: 12.9   Smokeless tobacco: Never   Tobacco comments:    started back and quit again 6 months ago  Vaping Use   Vaping Use: Never used  Substance and Sexual Activity   Alcohol use: Not Currently    Comment: Rare    Drug use: No   Sexual activity: Yes    Birth control/protection: Post-menopausal  Other Topics Concern   Not on file  Social History Narrative   Caffeine daily  Social Determinants of Health   Financial Resource Strain: Not on file  Food Insecurity: Not on file  Transportation Needs: Not on file  Physical Activity: Not on file  Stress: Not on file  Social Connections: Not on file     Family History:  The patient's  family history includes Arthritis in her father, sister, and sister; Cancer in her father; Dementia in her father; Diabetes in her paternal grandfather; Hyperlipidemia in her father, mother, sister, and sister; Hypertension in her father and mother; Other in her sister; Sleep apnea in her father.   ROS:   Please see the history of present illness.    ROS All other systems reviewed and are negative.   PHYSICAL EXAM:   VS:  BP (!) 152/94   Pulse 60   Ht 5\' 2"  (1.575 m)   Wt 181 lb (82.1 kg)   LMP 12/31/2013   SpO2 97%   BMI 33.11 kg/m   Physical Exam  GEN: Well nourished, well developed, in no acute distress  Neck: no JVD, carotid bruits, or masses Cardiac:RRR; no murmurs, rubs, or gallops  Respiratory:  clear to auscultation bilaterally, normal work of breathing GI: soft, nontender, nondistended, + BS Ext: without cyanosis, clubbing, or edema, Good distal pulses bilaterally Neuro:  Alert and Oriented x 3 Psych: euthymic mood, full affect  Wt Readings from Last 3 Encounters:  07/07/21 181 lb (82.1 kg)  06/30/21 175 lb (79.4 kg)  05/12/21 175 lb (79.4 kg)      Studies/Labs Reviewed:   EKG:  EKG is  ordered today.  The ekg ordered today demonstrates NSR, normal EKG  Recent Labs: 10/11/2020: Magnesium 1.7 11/12/2020: TSH  1.990 06/10/2021: ALT 15; BUN 14; Creatinine, Ser 0.61; Hemoglobin 14.7; Platelets 253; Potassium 3.9; Sodium 140   Lipid Panel    Component Value Date/Time   CHOL 157 11/12/2020 0836   TRIG 212 (H) 11/12/2020 0836   TRIG 156 (H) 06/06/2013 1147   HDL 37 (L) 11/12/2020 0836   HDL 50 06/06/2013 1147   CHOLHDL 4.2 11/12/2020 0836   CHOLHDL 4.4 01/30/2019 0224   VLDL 19 01/30/2019 0224   LDLCALC 84 11/12/2020 0836   LDLCALC 50 06/06/2013 1147    Additional studies/ records that were reviewed today include:   NST 06/2018  Nuclear stress EF: 81%. There were T wave inversions in the inferolateral wall during infusion The study is normal. This is a low risk study. The left ventricular ejection fraction is hyperdynamic (>65%).   Echo 01/2019 IMPRESSIONS     1. The left ventricle has normal systolic function, with an ejection  fraction of 55-60%. The cavity size was normal. There is mildly increased  left ventricular wall thickness. Left ventricular diastolic Doppler  parameters are consistent with impaired  relaxation. Indeterminate filling pressures The E/e' is 8-15. No evidence  of left ventricular regional wall motion abnormalities.   2. The right ventricle has normal systolic function. The cavity was  normal. There is no increase in right ventricular wall thickness.   3. Left atrial size was mild-moderately dilated.   4. Right atrial size was mildly dilated.   5. The mitral valve is abnormal. Mild thickening of the mitral valve  leaflet.   6. The tricuspid valve is grossly normal.   7. The aortic valve is tricuspid. No stenosis of the aortic valve.   SUMMARY    LVEF 55-60%, mild LVH, normal wall motion, grade 1 DD, indeterminate  LV filling pressure, mild LAE, mild MR,  mild TR, normal RVSP, normal  IVC   FINDINGS   Left Ventricle: The left ventricle has normal systolic function, with an  ejection fraction of 55-60%. The cavity size was normal. There is mildly   increased left ventricular wall thickness. Left ventricular diastolic  Doppler parameters are consistent  with impaired relaxation. Indeterminate filling pressures The E/e' is  8-15. No evidence of left ventricular regional wall motion abnormalities..   Right Ventricle: The right ventricle has normal systolic function. The  cavity was normal. There is no increase in right ventricular wall  thickness.   Left Atrium: Left atrial size was mild-moderately dilated.   Right Atrium: Right atrial size was mildly dilated. Right atrial pressure  is estimated at 3 mmHg.   Interatrial Septum: No atrial level shunt detected by color flow Doppler.   Pericardium: There is no evidence of pericardial effusion.   Mitral Valve: The mitral valve is abnormal. Mild thickening of the mitral  valve leaflet. Mitral valve regurgitation is mild by color flow Doppler.   Tricuspid Valve: The tricuspid valve is grossly normal. Tricuspid valve  regurgitation is mild by color flow Doppler.   Aortic Valve: The aortic valve is tricuspid Aortic valve regurgitation was  not visualized by color flow Doppler. There is No stenosis of the aortic  valve.   Pulmonic Valve: The pulmonic valve was grossly normal. Pulmonic valve  regurgitation is not visualized by color flow Doppler.   Venous: The inferior vena cava measures 1.65 cm, is normal in size with  greater than 50% respiratory variability.      Risk Assessment/Calculations:         ASSESSMENT:    1. Preoperative clearance   2. Coronary artery disease involving native coronary artery of native heart without angina pectoris   3. Essential hypertension   4. Mixed hyperlipidemia   5. OSA on CPAP      PLAN:  In order of problems listed above:  Preoperative clearance for left total hip arthroplasty 09/02/2021 by Dr. Elmyra Ricks. Patient has been very active up until recently b/c of hip but METS still greater than 8.  She had Imdur increased risk of surgery  given her history of CAD but is asymptomatic and on good meds.  She should be acceptable risk of upcoming surgery.  She should call us if anything changes symptomatically before her surgery scheduled in February. According to the Revised Cardiac Risk Index (RCRI), her Perioperative Risk of Major Cardiac Event is (%): 0.9  Her Functional Capacity in METs is: 8.23 according to the Duke Activity Status Index (DASI).   CAD S/P inferior MI stent RCA and intermediate stenosis mid LAD medically, NST negative ischemia 2012, 2016 and 2019 asymptomatic and has remained very active.  Denies angina.  Reduce aspirin to 81 mg once daily.  HTN blood pressure is elevated.  She checks it regularly and it is usually 120/70.  I asked her to keep a close eye on this and call us up with stays elevated.  HLD-LDL 84 in April. She'd like to increase crestor 5 days/week.  Also taking Vascepa again.  Recheck fasting lipid panel in 3 months.  Goal LDL is less than 70  OSA not using CPAP-doesn't like the noise. Uses the nasal pillows.  She said she will do better about using her CPAP.  Shared Decision Making/Informed Consent        Medication Adjustments/Labs and Tests Ordered: Current medicines are reviewed at length with the patient today.  Concerns regarding medicines  are outlined above.  Medication changes, Labs and Tests ordered today are listed in the Patient Instructions below. Patient Instructions  Medication Instructions:   Your physician recommends that you continue on your current medications as directed. Please refer to the Current Medication list given to you today.  *If you need a refill on your cardiac medications before your next appointment, please call your pharmacy*   Lab Work: [place]   If you have labs (blood work) drawn today and your tests are completely normal, you will receive your results only by: [place] (if you have MyChart) OR A paper copy in the mail If  you have any lab test that is abnormal or we need to change your treatment, we will call you to review the results.   Testing/Procedures: NONE ORDERED  TODAY    Follow-Up: At [facility], you and your health needs are our priority.  As part of our continuing mission to provide you with exceptional heart care, we have created designated Provider Care Teams.  These Care Teams include your primary Cardiologist (physician) and Advanced Practice Providers (APPs -  Physician Assistants and Nurse Practitioners) who all work together to provide you with the care you need, when you need it.  We recommend signing up for the patient portal called "MyChart".  Sign up information is provided on this After Visit Summary.  MyChart is used to connect with patients for Virtual Visits (Telemedicine).  Patients are able to view lab/test results, encounter notes, upcoming appointments, etc.  Non-urgent messages can be sent to your provider as well.   To learn more about what you can do with MyChart, go to [email or web address].    Your next appointment:   1 year(s)  The format for your next appointment:   In Person  Provider:   Sinclair Grooms, MD   Other Instructions   Signed, Ermalinda Barrios, PA-C  07/07/2021 12:30 PM    [address], [place], [place]  [address] Phone: [phone]; Fax: [phone]

## 2021-06-30 NOTE — Progress Notes (Signed)
Subjective: CC: Left-sided hip pain PCP: [name], DO JKD:TOIZTI K Heather Lam is a 60 y.o. female presenting to clinic today for:  1.  Left-sided hip pain Patient is scheduled for surgery on 09/02/2021 for left hip OA.  She continues to have intermittent pain that seems to be exacerbated when she is more active.  She continues to ambulate independently though gait is altered due to the hip pain.  She has a emergency pack of prednisone on hand should she need it but has primarily been relying on sparing use of her Norco.  She was prescribed a bottle of 20 Norco's back in September and is on her last pill.  She has been using this roughly only on weekends and usually only 3 to 4 pills max per week.  Denies any excessive daytime sedation, falls, respiratory depression, visual or auditory hallucinations.  No constipation.  She limits her use of NSAIDs in the setting of history of previous heart attack  ROS: Per HPI  No Known Allergies Past Medical History:  Diagnosis Date   Anxiety    Back pain    CAD (coronary artery disease)    Chest pain    Complication of anesthesia    Constipation    Coronary arteriosclerosis 05/04/2016   Diverticulitis    Diverticulosis    Family history of adverse reaction to anesthesia    Mom PONV   Heart attack ([place]) 2010   History of carpal tunnel syndrome    Bilateral   History of kidney stones    Hyperlipidemia    Hypertension    Intra-abdominal abscess ([place])    Joint pain    Migraines    Morbid obesity ([place])    Myocardial infarction ([place]) 05/04/2009   PONV (postoperative nausea and vomiting)    Pre-diabetes    pt. denies . It is noted in pt. chart   Skin cancer    on nose   Sleep apnea    not using cpap at this time   Vitamin D deficiency     Current Outpatient Medications:    calcium carbonate (OSCAL) 1500 (600 Ca) MG TABS tablet, Take 600 mg of elemental calcium by mouth daily with breakfast., Disp: , Rfl:    Cholecalciferol 125 MCG  (5000 UT) TABS, Take by mouth., Disp: , Rfl:    Coenzyme Q10 100 MG TABS, Take by mouth., Disp: , Rfl:    docusate sodium (COLACE) 100 MG capsule, Take 200 mg by mouth daily., Disp: , Rfl:    DULoxetine (CYMBALTA) 60 MG capsule, Take 1 capsule (60 mg total) by mouth daily., Disp: 90 capsule, Rfl: 3   icosapent Ethyl (VASCEPA) 1 g capsule, TAKE 2 CAPULES BY MOUTH TWICE DAILY, Disp: 360 capsule, Rfl: 1   losartan-hydrochlorothiazide (HYZAAR) 100-25 MG tablet, TAKE 1 TABLET BY MOUTH DAILY., Disp: 90 tablet, Rfl: 1   metoprolol tartrate (LOPRESSOR) 25 MG tablet, TAKE 1 TABLET BY MOUTH 2 TIMES DAILY, Disp: 180 tablet, Rfl: 1   Multiple Vitamin (MULTIVITAMIN) tablet, Take 1 tablet by mouth daily., Disp: , Rfl:    nitroGLYCERIN (NITROSTAT) 0.4 MG SL tablet, 1 TABLET UNDER TONGUE EVERY 5 MINUTES UP TO 3 TIMES FOR CHEST PAIN THEN CALL DR IF NO RELIEF, Disp: 25 tablet, Rfl: 3   rosuvastatin (CRESTOR) 20 MG tablet, Take 1 tablet (20 mg total) by mouth 4 (four) times a week., Disp: 90 tablet, Rfl: 2   Semaglutide,0.25 or 0.5MG /DOS, (OZEMPIC, 0.25 OR 0.5 MG/DOSE,) 2 MG/1.5ML SOPN, Inject 0.25  mg into the skin once a week for 14 days, THEN 0.5 mg once a week., Disp: 4.5 mL, Rfl: 3   traZODone (DESYREL) 50 MG tablet, TAKE 1 TO 2 TABLETS BY MOUTH AT BEDTIME AS NEEDED FOR SLEEP, Disp: 90 tablet, Rfl: 3   tretinoin (RETIN-A) 0.05 % cream, Apply pea size amount to affected regions 2-3 times a week building up to nightly., Disp: 20 g, Rfl: 11   varenicline (CHANTIX) 1 MG tablet, TAKE  (1)  TABLET TWICE A DAY., Disp: 60 tablet, Rfl: 3 Social History   Socioeconomic History   Marital status: Single    Spouse name: Not on file   Number of children: Not on file   Years of education: Not on file   Highest education level: Not on file  Occupational History   Occupation: Optician, dispensing: [employer]    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[employer] Use   Smoking status: Former    Packs/day: 1.00     Years: 20.00    Pack years: 20.00    Types: Cigarettes    Quit date: 2010    Years since quitting: 12.9   Smokeless tobacco: Never   Tobacco comments:    started back and quit again 6 months ago  Vaping Use   Vaping Use: Never used  Substance and Sexual Activity   Alcohol use: Not Currently    Comment: Rare    Drug use: No   Sexual activity: Yes    Birth control/protection: Post-menopausal  Other Topics Concern   Not on file  Social History Narrative   Caffeine daily    Social Determinants of Health   Financial Resource Strain: Not on file  Food Insecurity: Not on file  Transportation Needs: Not on file  Physical Activity: Not on file  Stress: Not on file  Social Connections: Not on file  Intimate Partner Violence: Not on file   Family History  Problem Relation Age of Onset   Hyperlipidemia Mother    Hypertension Mother    Cancer Father        skin   Hyperlipidemia Father    Dementia Father    Arthritis Father    Hypertension Father    Sleep apnea Father    Other Sister        Myelofibrosis   Arthritis Sister    Hyperlipidemia Sister    Arthritis Sister    Hyperlipidemia Sister    Diabetes Paternal Grandfather    Colon cancer Neg Hx     Objective: Office vital signs reviewed. BP 133/73   Pulse 66   Temp (!) 97.3 F (36.3 C)   Ht 5\' 2"  (1.575 m)   Wt 175 lb (79.4 kg)   LMP 12/31/2013   SpO2 97%   BMI 32.01 kg/m   Physical Examination:  General: Awake, alert, well nourished, No acute distress MSK: Ambulating independently though gait is antalgic  Assessment/ Plan: 60 y.o. female   Primary osteoarthritis of left hip - Plan: HYDROcodone-acetaminophen (NORCO) 5-325 MG tablet  Hip pain - Plan: HYDROcodone-acetaminophen (NORCO) 5-325 MG tablet  Pain is moderately controlled by sparing use of Norco.  She is set up for hip surgery in February.  No red flag signs or symptoms.  Norco renewed for as needed use until that time.  Of course she should  continue using sparingly and with caution.  The Narcotic Database has been reviewed.  There were no red flags.  No orders of the defined types were placed in this encounter.  No orders of the defined types were placed in this encounter.    [name], DO [place] [phone]

## 2021-07-02 ENCOUNTER — Other Ambulatory Visit (HOSPITAL_COMMUNITY): Payer: Self-pay

## 2021-07-03 ENCOUNTER — Other Ambulatory Visit: Payer: No Typology Code available for payment source

## 2021-07-03 ENCOUNTER — Other Ambulatory Visit: Payer: Self-pay

## 2021-07-03 ENCOUNTER — Encounter: Payer: Self-pay | Admitting: Family Medicine

## 2021-07-03 DIAGNOSIS — E559 Vitamin D deficiency, unspecified: Secondary | ICD-10-CM

## 2021-07-04 LAB — VITAMIN D 25 HYDROXY (VIT D DEFICIENCY, FRACTURES): Vit D, 25-Hydroxy: 56.1 ng/mL (ref 30.0–100.0)

## 2021-07-07 ENCOUNTER — Encounter: Payer: Self-pay | Admitting: Physician Assistant

## 2021-07-07 ENCOUNTER — Other Ambulatory Visit: Payer: Self-pay

## 2021-07-07 ENCOUNTER — Ambulatory Visit (INDEPENDENT_AMBULATORY_CARE_PROVIDER_SITE_OTHER): Payer: No Typology Code available for payment source | Admitting: Physician Assistant

## 2021-07-07 VITALS — BP 152/94 | HR 60 | Ht 62.0 in | Wt 181.0 lb

## 2021-07-07 DIAGNOSIS — Z01818 Encounter for other preprocedural examination: Secondary | ICD-10-CM

## 2021-07-07 DIAGNOSIS — E782 Mixed hyperlipidemia: Secondary | ICD-10-CM

## 2021-07-07 DIAGNOSIS — I2510 Atherosclerotic heart disease of native coronary artery without angina pectoris: Secondary | ICD-10-CM | POA: Diagnosis not present

## 2021-07-07 DIAGNOSIS — I10 Essential (primary) hypertension: Secondary | ICD-10-CM | POA: Diagnosis not present

## 2021-07-07 DIAGNOSIS — G4733 Obstructive sleep apnea (adult) (pediatric): Secondary | ICD-10-CM

## 2021-07-07 DIAGNOSIS — Z9989 Dependence on other enabling machines and devices: Secondary | ICD-10-CM

## 2021-07-07 NOTE — Patient Instructions (Signed)
Medication Instructions:   Your physician recommends that you continue on your current medications as directed. Please refer to the Current Medication list given to you today.  *If you need a refill on your cardiac medications before your next appointment, please call your pharmacy*   Lab Work: [place]   If you have labs (blood work) drawn today and your tests are completely normal, you will receive your results only by: [place] (if you have MyChart) OR A paper copy in the mail If you have any lab test that is abnormal or we need to change your treatment, we will call you to review the results.   Testing/Procedures: NONE ORDERED  TODAY    Follow-Up: At [facility], you and your health needs are our priority.  As part of our continuing mission to provide you with exceptional heart care, we have created designated Provider Care Teams.  These Care Teams include your primary Cardiologist (physician) and Advanced Practice Providers (APPs -  Physician Assistants and Nurse Practitioners) who all work together to provide you with the care you need, when you need it.  We recommend signing up for the patient portal called "MyChart".  Sign up information is provided on this After Visit Summary.  MyChart is used to connect with patients for Virtual Visits (Telemedicine).  Patients are able to view lab/test results, encounter notes, upcoming appointments, etc.  Non-urgent messages can be sent to your provider as well.   To learn more about what you can do with MyChart, go to [email or web address].    Your next appointment:   1 year(s)  The format for your next appointment:   In Person  Provider:   Sinclair Grooms, MD   Other Instructions

## 2021-07-09 ENCOUNTER — Ambulatory Visit (INDEPENDENT_AMBULATORY_CARE_PROVIDER_SITE_OTHER): Payer: No Typology Code available for payment source | Admitting: Family Medicine

## 2021-07-09 ENCOUNTER — Other Ambulatory Visit (HOSPITAL_COMMUNITY)
Admission: RE | Admit: 2021-07-09 | Discharge: 2021-07-09 | Disposition: A | Discharge status: Home / Self Care | Payer: No Typology Code available for payment source | Source: Ambulatory Visit | Attending: Family Medicine | Admitting: Family Medicine

## 2021-07-09 ENCOUNTER — Encounter: Payer: Self-pay | Admitting: Family Medicine

## 2021-07-09 VITALS — BP 162/75 | HR 71 | Temp 97.4°F | Ht 62.0 in | Wt 181.0 lb

## 2021-07-09 DIAGNOSIS — L989 Disorder of the skin and subcutaneous tissue, unspecified: Secondary | ICD-10-CM | POA: Insufficient documentation

## 2021-07-09 DIAGNOSIS — D229 Melanocytic nevi, unspecified: Secondary | ICD-10-CM

## 2021-07-09 NOTE — Progress Notes (Signed)
Subjective:  Patient ID: Heather Lam, female    DOB: 1960/12/21, 60 y.o.   MRN: [id number]  Patient Care Team: [name], DO as PCP - General (Family Medicine) Belva Crome, MD as PCP - Cardiology (Cardiology) Lavera Guise, [facility] (Pharmacist)   Chief Complaint:  skin lesion   HPI: Heather Lam is a 60 y.o. female presenting on 07/09/2021 for skin lesion   Patient presents today for evaluation of abnormal mole to back.  States she noticed this a few months ago and has noticed over the past few weeks her size has increased.  She has also noticed changes in coloration of mole.   Relevant past medical, surgical, family, and social history reviewed and updated as indicated.  Allergies and medications reviewed and updated. Data reviewed: Chart in Epic.   Past Medical History:  Diagnosis Date   Anxiety    Back pain    CAD (coronary artery disease)    Chest pain    Complication of anesthesia    Constipation    Coronary arteriosclerosis 05/04/2016   Diverticulitis    Diverticulosis    Family history of adverse reaction to anesthesia    Mom PONV   Heart attack ([place]) 2010   History of carpal tunnel syndrome    Bilateral   History of kidney stones    Hyperlipidemia    Hypertension    Intra-abdominal abscess ([place])    Joint pain    Migraines    Morbid obesity ([place])    Myocardial infarction ([place]) 05/04/2009   PONV (postoperative nausea and vomiting)    Pre-diabetes    pt. denies . It is noted in pt. chart   Skin cancer    on nose   Sleep apnea    not using cpap at this time   Vitamin D deficiency     Past Surgical History:  Procedure Laterality Date   APPENDECTOMY     APPLICATION OF WOUND VAC N/A 02/09/2019   Procedure: APPLICATION OF WOUND VAC;  Surgeon: Clovis Riley, MD;  Location: [place];  Service: General;  Laterality: N/A;   BACK SURGERY     CARPAL TUNNEL RELEASE Right    CARPAL TUNNEL RELEASE Left 08/13/2016   Procedure: LEFT CARPAL TUNNEL  RELEASE;  Surgeon: Roseanne Kaufman, MD;  Location: [place];  Service: Orthopedics;  Laterality: Left;   COLECTOMY WITH COLOSTOMY CREATION/HARTMANN PROCEDURE N/A 02/09/2019   Procedure: HARTMANN PROCEDURE;  Surgeon: Clovis Riley, MD;  Location: [place];  Service: General;  Laterality: N/A;   COLONOSCOPY     COLOSTOMY N/A 02/09/2019   Procedure: COLOSTOMY;  Surgeon: Clovis Riley, MD;  Location: [place];  Service: General;  Laterality: N/A;   COLOSTOMY REVERSAL N/A 07/30/2019   Procedure: LAPAROSCOPIC ASSISTED REVERSAL OF END COLOSTOMY WITH RIGID PROCTOSCOPY;  Surgeon: Clovis Riley, MD;  [facility];  Service: General;  Laterality: N/A;   CORONARY STENT PLACEMENT     2 stents in the same artery   ENDOMETRIAL ABLATION  2005   [place] LITHOTRIPSY Right 06/27/2017   Procedure: RIGHT EXTRACORPOREAL SHOCK WAVE LITHOTRIPSY (ESWL);  Surgeon: Franchot Gallo, MD;  [facility];  Service: Urology;  Laterality: Right;   INCISIONAL HERNIA REPAIR N/A 10/10/2020   Procedure: LAPAROSCOPIC  WITH TRASITION TO OPEN INCISIONAL HERNIA REPAIR WITH MESH;  Surgeon: Clovis Riley, MD;  [facility];  Service: General;  Laterality: N/A;   KNEE ARTHROSCOPY WITH  MENISCAL REPAIR Right    MOHS SURGERY     TUBAL LIGATION  1991    Social History   Socioeconomic History   Marital status: Single    Spouse name: Not on file   Number of children: Not on file   Years of education: Not on file   Highest education level: Not on file  Occupational History   Occupation: Nurse    Employer: [employer]    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[employer] Use   Smoking status: Former    Packs/day: 1.00    Years: 20.00    Pack years: 20.00    Types: Cigarettes    Quit date: 2010    Years since quitting: 12.9   Smokeless tobacco: Never   Tobacco comments:    started back and quit again 6 months ago  Vaping Use   Vaping Use: Never used   Substance and Sexual Activity   Alcohol use: Not Currently    Comment: Rare    Drug use: No   Sexual activity: Yes    Birth control/protection: Post-menopausal  Other Topics Concern   Not on file  Social History Narrative   Caffeine daily    Social Determinants of Health   Financial Resource Strain: Not on file  Food Insecurity: Not on file  Transportation Needs: Not on file  Physical Activity: Not on file  Stress: Not on file  Social Connections: Not on file  Intimate Partner Violence: Not on file    Outpatient Encounter Medications as of 07/09/2021  Medication Sig   aspirin EC 81 MG tablet Take 81 mg by mouth daily. Swallow whole.   calcium carbonate (OSCAL) 1500 (600 Ca) MG TABS tablet Take 600 mg of elemental calcium by mouth daily with breakfast.   Cholecalciferol 125 MCG (5000 UT) TABS Take by mouth.   Coenzyme Q10 100 MG TABS Take by mouth.   docusate sodium (COLACE) 100 MG capsule Take 200 mg by mouth daily.   DULoxetine (CYMBALTA) 60 MG capsule Take 1 capsule (60 mg total) by mouth daily.   HYDROcodone-acetaminophen (NORCO) 5-325 MG tablet Take 1 tablet by mouth every 6 (six) hours as needed for moderate pain.   losartan-hydrochlorothiazide (HYZAAR) 100-25 MG tablet TAKE 1 TABLET BY MOUTH DAILY.   metoprolol tartrate (LOPRESSOR) 25 MG tablet TAKE 1 TABLET BY MOUTH 2 TIMES DAILY   Multiple Vitamin (MULTIVITAMIN) tablet Take 1 tablet by mouth daily.   nitroGLYCERIN (NITROSTAT) 0.4 MG SL tablet 1 TABLET UNDER TONGUE EVERY 5 MINUTES UP TO 3 TIMES FOR CHEST PAIN THEN CALL DR IF NO RELIEF   rosuvastatin (CRESTOR) 20 MG tablet Take 1 tablet (20 mg total) by mouth 4 (four) times a week.   Semaglutide,0.25 or 0.5MG /DOS, (OZEMPIC, 0.25 OR 0.5 MG/DOSE,) 2 MG/1.5ML SOPN Inject 0.25 mg into the skin once a week for 14 days, THEN 0.5 mg once a week.   varenicline (CHANTIX) 1 MG tablet TAKE  (1)  TABLET TWICE A DAY.   [DISCONTINUED] tretinoin (RETIN-A) 0.05 % cream Apply pea size  amount to affected regions 2-3 times a week building up to nightly.   icosapent Ethyl (VASCEPA) 1 g capsule TAKE 2 CAPULES BY MOUTH TWICE DAILY   traZODone (DESYREL) 50 MG tablet TAKE 1 TO 2 TABLETS BY MOUTH AT BEDTIME AS NEEDED FOR SLEEP   [DISCONTINUED] gabapentin (NEURONTIN) 300 MG capsule Take 1 capsule (300 mg total) by mouth every 8 (eight) hours as needed.   No facility-administered encounter  medications on file as of 07/09/2021.    No Known Allergies  Review of Systems  Constitutional:  Negative for activity change, appetite change, chills, diaphoresis, fatigue, fever and unexpected weight change.  Musculoskeletal:  Positive for arthralgias and gait problem.  Skin:  Positive for color change.  All other systems reviewed and are negative.      Objective:  BP (!) 162/75   Pulse 71   Temp (!) 97.4 F (36.3 C)   Ht 5\' 2"  (1.575 m)   Wt 181 lb (82.1 kg)   LMP 12/31/2013   SpO2 98%   BMI 33.11 kg/m    Wt Readings from Last 3 Encounters:  07/09/21 181 lb (82.1 kg)  07/07/21 181 lb (82.1 kg)  06/30/21 175 lb (79.4 kg)    Physical Exam Vitals and nursing note reviewed.  Constitutional:      General: She is not in acute distress.    Appearance: Normal appearance. She is not ill-appearing, toxic-appearing or diaphoretic.  HENT:     Head: Normocephalic and atraumatic.     Mouth/Throat:     Mouth: Mucous membranes are moist.  Eyes:     Conjunctiva/sclera: Conjunctivae normal.     Pupils: Pupils are equal, round, and reactive to light.  Pulmonary:     Effort: Pulmonary effort is normal. No respiratory distress.  Skin:    General: Skin is warm and dry.     Capillary Refill: Capillary refill takes less than 2 seconds.     Findings: Lesion present.       Neurological:     General: No focal deficit present.     Mental Status: She is alert and oriented to person, place, and time.     Gait: Gait abnormal (antalgic).  Psychiatric:        Mood and Affect: Mood normal.         Behavior: Behavior normal.        Thought Content: Thought content normal.        Judgment: Judgment normal.    Results for orders placed or performed in visit on 07/03/21  Vitamin D, 25-hydroxy  Result Value Ref Range   Vit D, 25-Hydroxy 56.1 30.0 - 100.0 ng/mL     Skin excision  Date/Time: 07/09/2021 3:49 PM Performed by: Baruch Gouty, FNP Authorized by: Baruch Gouty, FNP   Number of Lesions: 2 Lesion 1:    Body area: trunk   Trunk location: back   Initial size (mm): 10   Malignancy: malignancy unknown     Destruction method comment: shave biopsy Lesion 2:    Body area: trunk   Trunk location: back   Initial size (mm): 15   Malignancy: malignancy unknown     Destruction method comment: shave biopsy   Pertinent labs & imaging results that were available during my care of the patient were reviewed by me and considered in my medical decision making.  Assessment & Plan:  Posey was seen today for skin lesion.  Diagnoses and all orders for this visit:  Skin lesion of back Atypical mole Atypical mole to back with a recent change in size and color.  Lesion excised with shave biopsy and sent for pathology.  Patient tolerated procedure well.  Bandage is applied.  Wound care discussed in detail. -     Surgical pathology     Continue all other maintenance medications.  Follow up plan: Return if symptoms worsen or fail to improve.   Continue healthy lifestyle choices,  including diet (rich in fruits, vegetables, and lean proteins, and low in salt and simple carbohydrates) and exercise (at least 30 minutes of moderate physical activity daily).  Educational handout given for mole excision   The above assessment and management plan was discussed with the patient. The patient verbalized understanding of and has agreed to the management plan. Patient is aware to call the clinic if they develop any new symptoms or if symptoms persist or worsen. Patient is aware when to  return to the clinic for a follow-up visit. Patient educated on when it is appropriate to go to the emergency department.   Monia Pouch, FNP-C [facility] [phone]

## 2021-07-14 ENCOUNTER — Other Ambulatory Visit (HOSPITAL_COMMUNITY): Payer: Self-pay

## 2021-07-14 LAB — SURGICAL PATHOLOGY

## 2021-07-17 ENCOUNTER — Encounter: Payer: Self-pay | Admitting: Family Medicine

## 2021-07-21 ENCOUNTER — Other Ambulatory Visit (HOSPITAL_COMMUNITY): Payer: Self-pay

## 2021-07-24 ENCOUNTER — Other Ambulatory Visit: Payer: Self-pay | Admitting: Family Medicine

## 2021-07-24 DIAGNOSIS — I10 Essential (primary) hypertension: Secondary | ICD-10-CM

## 2021-07-24 MED ORDER — HYDROCHLOROTHIAZIDE 25 MG PO TABS: 25.0000 mg | ORAL_TABLET | Freq: Every day | ORAL | 3 refills | Status: DC

## 2021-07-24 NOTE — Progress Notes (Signed)
BP staying above goal on current regimen. No associated symptoms. Will increase HCTZ to 50 mg to optimize current regimen before adding additional medications.

## 2021-07-29 ENCOUNTER — Other Ambulatory Visit: Payer: Self-pay | Admitting: *Deleted

## 2021-07-29 ENCOUNTER — Encounter: Payer: Self-pay | Admitting: Family Medicine

## 2021-07-29 ENCOUNTER — Other Ambulatory Visit (HOSPITAL_COMMUNITY): Payer: Self-pay

## 2021-07-29 MED ORDER — NITROGLYCERIN 0.4 MG SL SUBL
SUBLINGUAL_TABLET | SUBLINGUAL | 3 refills | Status: DC
  Filled 2021-07-29: qty 25, 8d supply, fill #0
  Filled 2022-03-15: qty 25, 8d supply, fill #1

## 2021-07-31 ENCOUNTER — Ambulatory Visit: Payer: No Typology Code available for payment source | Admitting: Family Medicine

## 2021-08-11 ENCOUNTER — Other Ambulatory Visit (HOSPITAL_COMMUNITY): Payer: Self-pay

## 2021-08-11 NOTE — H&P (Addendum)
TOTAL HIP ADMISSION H&P  Patient is admitted for left total hip arthroplasty.  Subjective:  Chief Complaint: Left hip pain  HPI: Heather Lam, 61 y.o. female, has a history of pain and functional disability in the left hip due to arthritis and patient has failed non-surgical conservative treatments for greater than 12 weeks to include corticosteriod injections and activity modification. Onset of symptoms was gradual, starting  several  years ago with gradually worsening course since that time. The patient noted no past surgery on the left hip. Patient currently rates pain in the left hip at 7 out of 10 with activity. Patient has night pain, worsening of pain with activity and weight bearing, pain that interfers with activities of daily living, and pain with passive range of motion. Patient has evidence of  near bone-on-bone arthritis with large marginal osteophytes  by imaging studies. This condition presents safety issues increasing the risk of falls. There is no current active infection.  Patient Active Problem List   Diagnosis Date Noted   S/P hernia surgery 10/10/2020   Morbid obesity ([place])    History of colostomy reversal 07/30/2019   Cyst of ovary 02/20/2019   Kidney stone 02/20/2019   Migraine 02/20/2019   Diverticulitis 01/27/2019   Actinic keratoses 05/04/2017   History of MI (myocardial infarction) 04/01/2017   Prediabetes 09/28/2016   Vitamin D deficiency 08/16/2016   Recurrent major depressive disorder, in partial remission ([place]) 05/25/2016   Diverticular disease 05/04/2016   Malignant neoplasm of skin 05/04/2016   Carpal tunnel syndrome 02/26/2016   Obesity (BMI 30-39.9) 02/25/2016   Insomnia 07/01/2015   Essential hypertension 05/21/2015   Generalized anxiety disorder 06/06/2013   CAD S/P percutaneous coronary angioplasty 09/13/2011   Antiplatelet or antithrombotic long-term use 09/13/2011   OSA on CPAP 09/13/2011   Hyperlipidemia 09/13/2011    Past Medical  History:  Diagnosis Date   Anxiety    Back pain    CAD (coronary artery disease)    Chest pain    Complication of anesthesia    Constipation    Coronary arteriosclerosis 05/04/2016   Diverticulitis    Diverticulosis    Family history of adverse reaction to anesthesia    Mom PONV   Heart attack ([place]) 2010   History of carpal tunnel syndrome    Bilateral   History of kidney stones    Hyperlipidemia    Hypertension    Intra-abdominal abscess ([place])    Joint pain    Migraines    Morbid obesity ([place])    Myocardial infarction ([place]) 05/04/2009   PONV (postoperative nausea and vomiting)    Pre-diabetes    pt. denies . It is noted in pt. chart   Skin cancer    on nose   Sleep apnea    not using cpap at this time   Vitamin D deficiency     Past Surgical History:  Procedure Laterality Date   APPENDECTOMY     APPLICATION OF WOUND VAC N/A 02/09/2019   Procedure: APPLICATION OF WOUND VAC;  Surgeon: Clovis Riley, MD;  Location: [place];  Service: General;  Laterality: N/A;   BACK SURGERY     CARPAL TUNNEL RELEASE Right    CARPAL TUNNEL RELEASE Left 08/13/2016   Procedure: LEFT CARPAL TUNNEL RELEASE;  Surgeon: Roseanne Kaufman, MD;  Location: [place];  Service: Orthopedics;  Laterality: Left;   COLECTOMY WITH COLOSTOMY CREATION/HARTMANN PROCEDURE N/A 02/09/2019   Procedure: HARTMANN PROCEDURE;  Surgeon: Clovis Riley, MD;  Location: [place];  Service: General;  Laterality: N/A;   COLONOSCOPY     COLOSTOMY N/A 02/09/2019   Procedure: COLOSTOMY;  Surgeon: Clovis Riley, MD;  Location: [place];  Service: General;  Laterality: N/A;   COLOSTOMY REVERSAL N/A 07/30/2019   Procedure: LAPAROSCOPIC ASSISTED REVERSAL OF END COLOSTOMY WITH RIGID PROCTOSCOPY;  Surgeon: Clovis Riley, MD;  [facility];  Service: General;  Laterality: N/A;   CORONARY STENT PLACEMENT     2 stents in the same artery   ENDOMETRIAL ABLATION  2005   [place] LITHOTRIPSY  Right 06/27/2017   Procedure: RIGHT EXTRACORPOREAL SHOCK WAVE LITHOTRIPSY (ESWL);  Surgeon: Franchot Gallo, MD;  [facility];  Service: Urology;  Laterality: Right;   INCISIONAL HERNIA REPAIR N/A 10/10/2020   Procedure: LAPAROSCOPIC  WITH TRASITION TO OPEN INCISIONAL HERNIA REPAIR WITH MESH;  Surgeon: Clovis Riley, MD;  [facility];  Service: General;  Laterality: N/A;   KNEE ARTHROSCOPY WITH MENISCAL REPAIR Right    MOHS SURGERY     TUBAL LIGATION  1991    Prior to Admission medications   Medication Sig Start Date End Date Taking? Authorizing Provider  aspirin EC 81 MG tablet Take 81 mg by mouth daily. Swallow whole.    [provider]  calcium carbonate (OSCAL) 1500 (600 Ca) MG TABS tablet Take 600 mg of elemental calcium by mouth daily with breakfast.    [provider]  Cholecalciferol 125 MCG (5000 UT) TABS Take by mouth.    [provider]  Coenzyme Q10 100 MG TABS Take by mouth.    [provider]  docusate sodium (COLACE) 100 MG capsule Take 200 mg by mouth daily.    [provider]  DULoxetine (CYMBALTA) 60 MG capsule Take 1 capsule (60 mg total) by mouth daily. 05/12/21   [name], DO  hydrochlorothiazide (HYDRODIURIL) 25 MG tablet Take 1 tablet (25 mg total) by mouth daily. 07/24/21   Baruch Gouty, FNP  HYDROcodone-acetaminophen (NORCO) 5-325 MG tablet Take 1 tablet by mouth every 6 (six) hours as needed for moderate pain. 06/30/21   [name], DO  icosapent Ethyl (VASCEPA) 1 g capsule TAKE 2 CAPULES BY MOUTH TWICE DAILY 06/30/20 07/07/21  Ronnie Doss M, DO  losartan-hydrochlorothiazide (HYZAAR) 100-25 MG tablet TAKE 1 TABLET BY MOUTH DAILY. 03/27/21 03/27/22  Ronnie Doss M, DO  metoprolol tartrate (LOPRESSOR) 25 MG tablet TAKE 1 TABLET BY MOUTH 2 TIMES DAILY 04/24/21 04/24/22  Ronnie Doss M, DO  Multiple Vitamin (MULTIVITAMIN) tablet Take 1 tablet by mouth daily.    [provider]  nitroGLYCERIN (NITROSTAT) 0.4 MG SL tablet DISSOLVE 1 TABLET UNDER TONGUE EVERY 5 MINUTES UP TO 3 TIMES FOR CHEST PAIN THEN CALL DR IF NO RELIEF 07/29/21   Ronnie Doss M, DO  rosuvastatin (CRESTOR) 20 MG tablet Take 1 tablet (20 mg total) by mouth 4 (four) times a week. 01/27/21   Belva Crome, MD  Semaglutide,0.25 or 0.5MG /DOS, (OZEMPIC, 0.25 OR 0.5 MG/DOSE,) 2 MG/1.5ML SOPN Inject 0.25 mg into the skin once a week for 14 days, THEN 0.5 mg once a week. 01/20/21 02/03/22  Ronnie Doss M, DO  traZODone (DESYREL) 50 MG tablet TAKE 1 TO 2 TABLETS BY MOUTH AT BEDTIME AS NEEDED FOR SLEEP 07/08/20 07/08/21  Ronnie Doss M, DO  varenicline (CHANTIX) 1 MG tablet TAKE  (1)  TABLET TWICE A DAY. 04/21/21   Gottschalk, Leatrice Jewels M, DO  gabapentin (NEURONTIN) 300  MG capsule Take 1 capsule (300 mg total) by mouth every 8 (eight) hours as needed. 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   Smoking status: Former    Packs/day: 1.00    Years: 20.00    Pack years: 20.00    Types: Cigarettes    Quit date: 2010    Years since quitting: 13.0   Smokeless tobacco: Never   Tobacco comments:    started back and quit again 6 months ago  Vaping Use   Vaping Use: Never used  Substance and Sexual Activity   Alcohol use: Not Currently    Comment: Rare    Drug use: No   Sexual activity: Yes    Birth control/protection: Post-menopausal  Other Topics Concern   Not on file  Social History Narrative   Caffeine daily    Social Determinants of Health   Financial Resource Strain: Not on file  Food Insecurity: Not on file   Transportation Needs: Not on file  Physical Activity: Not on file  Stress: Not on file  Social Connections: Not on file  Intimate Partner Violence: Not on file    Tobacco Use: Medium Risk   Smoking Tobacco Use: Former   Smokeless Tobacco Use: Never   Passive Exposure: Not on file   Social History   Substance and Sexual Activity  Alcohol Use Not Currently   Comment: Rare     Family History  Problem Relation Age of Onset   Hyperlipidemia Mother    Hypertension Mother    Cancer Father        skin   Hyperlipidemia Father    Dementia Father    Arthritis Father    Hypertension Father    Sleep apnea Father    Other Sister        Myelofibrosis   Arthritis Sister    Hyperlipidemia Sister    Arthritis Sister    Hyperlipidemia Sister    Diabetes Paternal Grandfather    Colon cancer Neg Hx     Review of Systems  Constitutional:  Negative for chills and fever.  HENT:  Negative for congestion, sore throat and tinnitus.   Eyes:  Negative for double vision, photophobia and pain.  Respiratory:  Negative for cough, shortness of breath and wheezing.   Cardiovascular:  Negative for chest pain, palpitations and orthopnea.  Gastrointestinal:  Negative for heartburn, nausea and vomiting.  Genitourinary:  Negative for dysuria, frequency and urgency.  Musculoskeletal:  Positive for joint pain.  Neurological:  Negative for dizziness, weakness and headaches.    Objective:  Physical Exam: Well nourished and well developed.  General: Alert and oriented x3, cooperative and pleasant, no acute distress.  Head: normocephalic, atraumatic, neck supple.  Eyes: EOMI.  Musculoskeletal:  Left Hip Exam:  ROM: Flexion to 100, internal rotation is minimal, external Rotation 20, and abduction 20 without discomfort.  There is no tenderness over the greater trochanter bursa.   Calves soft and nontender. Motor function intact in LE. Strength 5/5 LE bilaterally. Neuro: Distal pulses 2+.  Sensation to light touch intact  in LE.   Imaging Review Plain radiographs demonstrate severe degenerative joint disease of the left hip. The bone quality appears to be adequate for age and reported activity level.  Assessment/Plan:  End stage arthritis, left hip  The patient history, physical examination, clinical judgement of the provider and imaging studies are consistent with end stage degenerative joint disease of the left hip and total hip arthroplasty is deemed medically necessary. The treatment options including medical management, injection therapy, arthroscopy and arthroplasty were discussed at length. The risks and benefits of total hip arthroplasty were presented and reviewed. The risks due to aseptic loosening, infection, stiffness, dislocation/subluxation, thromboembolic complications and other imponderables were discussed. The patient acknowledged the explanation, agreed to proceed with the plan and consent was signed. Patient is being admitted for inpatient treatment for surgery, pain control, PT, OT, prophylactic antibiotics, VTE prophylaxis, progressive ambulation and ADLs and discharge planning.The patient is planning to be discharged  home .   Patient's anticipated LOS is less than 2 midnights, meeting these requirements: - Younger than 66 - Lives within 1 hour of care - Has a competent adult at home to recover with post-op recover - NO history of  - Diabetes  - Heart failure  - Stroke  - DVT/VTE  - Cardiac arrhythmia  - Respiratory Failure/COPD  - Renal failure  - Anemia  - Advanced Liver disease  Therapy Plans: HEP Disposition: Home with daughter Planned DVT Prophylaxis: Xarelto 10 mg QD DME Needed: Gilford Rile PCP: Adam Phenix, DO (clearance received - requesting cardiac clearance) Cardiologist: Daneen Schick, MD (clearance received by Ermalinda Barrios, PA-C on 12/6) TXA: IV Allergies: NKDA Anesthesia Concerns: Nausea/vomiting BMI: 33 Last HgbA1c: Not diabetic.   Pharmacy: [facility] (bring to room)  Other: - MI in 2010 with two stents - Hydrocodone 5-325 mg QD as needed (only taking once a week max)  - On ozempic for weight loss  - Patient was instructed on what medications to stop prior to surgery. - Follow-up visit in 2 weeks with Dr. Wynelle Link - Begin physical therapy following surgery - Pre-operative lab work as pre-surgical testing - Prescriptions will be provided in hospital at time of discharge  Theresa Duty, PA-C Orthopedic Surgery [facility] [place]

## 2021-08-17 ENCOUNTER — Other Ambulatory Visit (HOSPITAL_COMMUNITY): Payer: Self-pay

## 2021-08-17 ENCOUNTER — Other Ambulatory Visit: Payer: Self-pay | Admitting: Family Medicine

## 2021-08-17 MED ORDER — ICOSAPENT ETHYL 1 G PO CAPS
ORAL_CAPSULE | ORAL | 1 refills | Status: DC
  Filled 2021-08-17: qty 360, 90d supply, fill #0

## 2021-08-18 ENCOUNTER — Other Ambulatory Visit (HOSPITAL_COMMUNITY): Payer: Self-pay

## 2021-08-18 NOTE — Progress Notes (Addendum)
Anesthesia Review:  PCP: Adam Phenix  Cardiologist : DR Daneen Schick  Clearance- 07/07/21- Minette Brine  Chest x-ray : 09/05/20- 2v  EKG : 07/07/21  Echo : 2020  Stress test: 2019  Cardiac Cath :  Activity level: can do a flight of stairs without difficulty  Sleep Study/ CPAP : has sleep apnea no cpap  Fasting Blood Sugar :      / Checks Blood Sugar -- times a day:   Blood Thinner/ Instructions /Last Dose: ASA / Instructions/ Last Dose :   325 mg aspirin - pt has instructions at home per pt regarding aspirin  Covid test will be doen at [facility] which is [facility] of [place]- it is not a rapid test done as 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  - results will be faxed and also results in [place] is for weight loss - last dose on 08/26/21.  Hgba1c-08/20/21-5.7

## 2021-08-18 NOTE — Progress Notes (Signed)
Covid test on 08/31/2021 at _________ Come thru main entrance at [facility].  Have a seat in the lobby to the right as you come thru the door. Call [phone] and let them know you are here for covid testing and give them your name.      Your procedure is scheduled on:          09/02/2021.    Report to [facility] Main  Entrance   Report to admitting at   0900AM     Call this number if you have problems the morning of surgery [phone]    REMEMBER: NO  SOLID FOOD CANDY OR GUM AFTER MIDNIGHT. CLEAR LIQUIDS UNTIL     0850am        . NOTHING BY MOUTH EXCEPT CLEAR LIQUIDS UNTIL  0850am   . PLEASE FINISH ENSURE DRINK PER SURGEON ORDER  WHICH NEEDS TO BE COMPLETED AT  0850am     .      CLEAR LIQUID DIET   Foods Allowed                                                                    Coffee and tea, regular and decaf                            Fruit ices (not with fruit pulp)                                      Iced Popsicles                                    Carbonated beverages, regular and diet                                    Cranberry, grape and apple juices Sports drinks like Gatorade Lightly seasoned clear broth or consume(fat free) Sugar, honey syrup ___________________________________________________________________      BRUSH YOUR TEETH MORNING OF SURGERY AND RINSE YOUR MOUTH OUT, NO CHEWING GUM CANDY OR MINTS.     Take these medicines the morning of surgery with A SIP OF WATER:  cymbalta, vascepa, metoprolol   DO NOT TAKE ANY DIABETIC MEDICATIONS DAY OF YOUR SURGERY                               You may not have any metal on your body including hair pins and              piercings  Do not wear jewelry, make-up, lotions, powders or perfumes, deodorant             Do not wear nail polish on your fingernails.  Do not shave  48 hours prior to surgery.              Men may shave face and neck.   Do not bring valuables to the hospital. [place]  NOT             RESPONSIBLE   FOR VALUABLES.  Contacts, dentures or bridgework may not be worn into surgery.  Leave suitcase in the car. After surgery it may be brought to your room.     Patients discharged the day of surgery will not be allowed to drive home. IF YOU ARE HAVING SURGERY AND GOING HOME THE SAME DAY, YOU MUST HAVE AN ADULT TO DRIVE YOU HOME AND BE WITH YOU FOR 24 HOURS. YOU MAY GO HOME BY TAXI OR UBER OR ORTHERWISE, BUT AN ADULT MUST ACCOMPANY YOU HOME AND STAY WITH YOU FOR 24 HOURS.  Name and phone number of your driver:  Special Instructions: N/A              Please read over the following fact sheets you were given: _____________________________________________________________________  [facility] - Preparing for Surgery Before surgery, you can play an important role.  Because skin is not sterile, your skin needs to be as free of germs as possible.  You can reduce the number of germs on your skin by washing with CHG (chlorahexidine gluconate) soap before surgery.  CHG is an antiseptic cleaner which kills germs and bonds with the skin to continue killing germs even after washing. Please DO NOT use if you have an allergy to CHG or antibacterial soaps.  If your skin becomes reddened/irritated stop using the CHG and inform your nurse when you arrive at Short Stay. Do not shave (including legs and underarms) for at least 48 hours prior to the first CHG shower.  You may shave your face/neck. Please follow these instructions carefully:  1.  Shower with CHG Soap the night before surgery and the  morning of Surgery.  2.  If you choose to wash your hair, wash your hair first as usual with your  normal  shampoo.  3.  After you shampoo, rinse your hair and body thoroughly to remove the  shampoo.                           4.  Use CHG as you would any other liquid soap.  You can apply chg directly  to the skin and wash                       Gently with a scrungie or clean washcloth.  5.   Apply the CHG Soap to your body ONLY FROM THE NECK DOWN.   Do not use on face/ open                           Wound or open sores. Avoid contact with eyes, ears mouth and genitals (private parts).                       Wash face,  Genitals (private parts) with your normal soap.             6.  Wash thoroughly, paying special attention to the area where your surgery  will be performed.  7.  Thoroughly rinse your body with warm water from the neck down.  8.  DO NOT shower/wash with your normal soap after using and rinsing off  the CHG Soap.                9.  Pat yourself dry with a clean towel.  10.  Wear clean pajamas.            11.  Place clean sheets on your bed the night of your first shower and do not  sleep with pets. Day of Surgery : Do not apply any lotions/deodorants the morning of surgery.  Please wear clean clothes to the hospital/surgery center.  FAILURE TO FOLLOW THESE INSTRUCTIONS MAY RESULT IN THE CANCELLATION OF YOUR SURGERY PATIENT SIGNATURE_________________________________  NURSE SIGNATURE__________________________________  ________________________________________________________________________

## 2021-08-20 ENCOUNTER — Encounter (HOSPITAL_COMMUNITY)
Admission: RE | Admit: 2021-08-20 | Discharge: 2021-08-20 | Disposition: A | Discharge status: Home / Self Care | Payer: No Typology Code available for payment source | Source: Ambulatory Visit | Attending: Orthopedic Surgery | Admitting: Orthopedic Surgery

## 2021-08-20 ENCOUNTER — Encounter (HOSPITAL_COMMUNITY): Payer: Self-pay

## 2021-08-20 ENCOUNTER — Other Ambulatory Visit: Payer: Self-pay

## 2021-08-20 VITALS — BP 150/79 | HR 66 | Temp 98.2°F | Resp 16 | Ht 62.0 in | Wt 144.0 lb

## 2021-08-20 DIAGNOSIS — I10 Essential (primary) hypertension: Secondary | ICD-10-CM | POA: Diagnosis not present

## 2021-08-20 DIAGNOSIS — Z955 Presence of coronary angioplasty implant and graft: Secondary | ICD-10-CM | POA: Insufficient documentation

## 2021-08-20 DIAGNOSIS — G4730 Sleep apnea, unspecified: Secondary | ICD-10-CM | POA: Insufficient documentation

## 2021-08-20 DIAGNOSIS — M1612 Unilateral primary osteoarthritis, left hip: Secondary | ICD-10-CM | POA: Diagnosis not present

## 2021-08-20 DIAGNOSIS — Z87891 Personal history of nicotine dependence: Secondary | ICD-10-CM | POA: Insufficient documentation

## 2021-08-20 DIAGNOSIS — I2510 Atherosclerotic heart disease of native coronary artery without angina pectoris: Secondary | ICD-10-CM | POA: Insufficient documentation

## 2021-08-20 DIAGNOSIS — Z01812 Encounter for preprocedural laboratory examination: Secondary | ICD-10-CM | POA: Insufficient documentation

## 2021-08-20 DIAGNOSIS — Z01818 Encounter for other preprocedural examination: Secondary | ICD-10-CM

## 2021-08-20 HISTORY — DX: Unspecified osteoarthritis, unspecified site: M19.90

## 2021-08-20 LAB — COMPREHENSIVE METABOLIC PANEL
ALT: 19 U/L (ref 0–44)
AST: 19 U/L (ref 15–41)
Albumin: 4.3 g/dL (ref 3.5–5.0)
Alkaline Phosphatase: 67 U/L (ref 38–126)
Anion gap: 7 (ref 5–15)
BUN: 22 mg/dL — ABNORMAL HIGH (ref 6–20)
CO2: 29 mmol/L (ref 22–32)
Calcium: 10 mg/dL (ref 8.9–10.3)
Chloride: 102 mmol/L (ref 98–111)
Creatinine, Ser: 0.58 mg/dL (ref 0.44–1.00)
GFR, Estimated: >60 mL/min (ref >60)
Glucose, Bld: 107 mg/dL — ABNORMAL HIGH (ref 70–99)
Potassium: 4.3 mmol/L (ref 3.5–5.1)
Sodium: 138 mmol/L (ref 135–145)
Total Bilirubin: 0.3 mg/dL (ref 0.3–1.2)
Total Protein: 7.7 g/dL (ref 6.5–8.1)

## 2021-08-20 LAB — PROTIME-INR
INR: 0.9 (ref 0.8–1.2)
Prothrombin Time: 12.5 seconds (ref 11.4–15.2)

## 2021-08-20 LAB — HEMOGLOBIN A1C
Hgb A1c MFr Bld: 5.7 % — ABNORMAL HIGH (ref 4.8–5.6)
Mean Plasma Glucose: 116.89 mg/dL

## 2021-08-20 LAB — CBC
HCT: 45.4 % (ref 36.0–46.0)
Hemoglobin: 14.8 g/dL (ref 12.0–15.0)
MCH: 29.1 pg (ref 26.0–34.0)
MCHC: 32.6 g/dL (ref 30.0–36.0)
MCV: 89.2 fL (ref 80.0–100.0)
Platelets: 253 10*3/uL (ref 150–400)
RBC: 5.09 MIL/uL (ref 3.87–5.11)
RDW: 12.6 % (ref 11.5–15.5)
WBC: 9.9 10*3/uL (ref 4.0–10.5)
nRBC: 0 % (ref 0.0–0.2)

## 2021-08-20 LAB — SURGICAL PCR SCREEN
MRSA, PCR: NEGATIVE
Staphylococcus aureus: NEGATIVE

## 2021-08-20 LAB — TYPE AND SCREEN
ABO/RH(D): O POS
Antibody Screen: NEGATIVE

## 2021-08-21 NOTE — Anesthesia Preprocedure Evaluation (Signed)
Anesthesia Evaluation    Airway        Dental   Pulmonary former smoker,           Cardiovascular hypertension,      Neuro/Psych    GI/Hepatic   Endo/Other    Renal/GU      Musculoskeletal   Abdominal   Peds  Hematology   Anesthesia Other Findings   Reproductive/Obstetrics                             Anesthesia Physical Anesthesia Plan  ASA:   Anesthesia Plan:    Post-op Pain Management:    Induction:   PONV Risk Score and Plan:   Airway Management Planned:   Additional Equipment:   Intra-op Plan:   Post-operative Plan:   Informed Consent:   Plan Discussed with:   Anesthesia Plan Comments: (See PAT note 08/20/2021, Konrad Felix Ward, PA-C)        Anesthesia Quick Evaluation

## 2021-08-21 NOTE — Progress Notes (Signed)
Anesthesia Chart Review   Case: [id number] Date/Time: 09/02/21 1135   Procedure: TOTAL HIP ARTHROPLASTY ANTERIOR APPROACH (Left: Hip)   Anesthesia type: Choice   Pre-op diagnosis: left hip osteoarthritis   Location: [facility] ROOM 10 / [facility]   Surgeons: Gaynelle Arabian, MD       DISCUSSION:61 y.o. former smoker with h/o PONV, HTN, CAD (stent to RCA 2010), sleep apnea, left hip OA scheduled for above procedure 09/02/21 with Dr. Gaynelle Arabian.   Pt last seen by cardiology 07/07/2021. Per OV note, "Preoperative clearance for left total hip arthroplasty 09/02/2021 by Dr. Elmyra Ricks. Patient has been very active up until recently b/c of hip but METS still greater than 8.  She had Imdur increased risk of surgery given her history of CAD but is asymptomatic and on good meds.  She should be acceptable risk of upcoming surgery.  She should call us if anything changes symptomatically before her surgery scheduled in February. According to the Revised Cardiac Risk Index (RCRI), her Perioperative Risk of Major Cardiac Event is (%): 0.9   Her Functional Capacity in METs is: 8.23 according to the Duke Activity Status Index (DASI)."  Anticipate pt can proceed with planned procedure barring acute status change.   VS: BP (!) 150/79    Pulse 66    Temp 36.8 C (Oral)    Resp 16    Ht 5\' 2"  (1.575 m)    Wt 65.3 kg    LMP 12/31/2013    SpO2 99%    BMI 26.34 kg/m   PROVIDERS: [name], DO is PCP   Cardiologist:  Sinclair Grooms, MD  LABS: Labs reviewed: Acceptable for surgery. (all labs ordered are listed, but only abnormal results are displayed)  Labs Reviewed  HEMOGLOBIN A1C - Abnormal; Notable for the following components:      Result Value   Hgb A1c MFr Bld 5.7 (*)    All other components within normal limits  COMPREHENSIVE METABOLIC PANEL - Abnormal; Notable for the following components:   Glucose, Bld 107 (*)    BUN 22 (*)    All other components within normal limits  SURGICAL PCR SCREEN  CBC   PROTIME-INR  TYPE AND SCREEN     IMAGES:   EKG: 07/07/2021 Rate 60 bpm  NSR  CV: Echo 01/30/2019 1. The left ventricle has normal systolic function, with an ejection  fraction of 55-60%. The cavity size was normal. There is mildly increased  left ventricular wall thickness. Left ventricular diastolic Doppler  parameters are consistent with impaired  relaxation. Indeterminate filling pressures The E/e' is 8-15. No evidence  of left ventricular regional wall motion abnormalities.   2. The right ventricle has normal systolic function. The cavity was  normal. There is no increase in right ventricular wall thickness.   3. Left atrial size was mild-moderately dilated.   4. Right atrial size was mildly dilated.   5. The mitral valve is abnormal. Mild thickening of the mitral valve  leaflet.   6. The tricuspid valve is grossly normal.   7. The aortic valve is tricuspid. No stenosis of the aortic valve.   Myocardial Perfusion 06/23/2018 Nuclear stress EF: 81%. There were T wave inversions in the inferolateral wall during infusion The study is normal. This is a low risk study. The left ventricular ejection fraction is hyperdynamic (>65%). Past Medical History:  Diagnosis Date   Arthritis    Back pain    CAD (coronary artery disease)  Chest pain    Complication of anesthesia    Constipation    Coronary arteriosclerosis 05/04/2016   Diverticulitis    Diverticulosis    Family history of adverse reaction to anesthesia    Mom PONV   Heart attack ([place]) 2010   History of carpal tunnel syndrome    Bilateral   History of kidney stones    Hyperlipidemia    Hypertension    Intra-abdominal abscess (HCC)    Joint pain    Morbid obesity ([place])    Myocardial infarction ([place]) 05/04/2009   PONV (postoperative nausea and vomiting)    Skin cancer    on nose   Sleep apnea    not using cpap at this time   Vitamin D deficiency     Past Surgical History:  Procedure Laterality Date    APPENDECTOMY     APPLICATION OF WOUND VAC N/A 02/09/2019   Procedure: APPLICATION OF WOUND VAC;  Surgeon: Clovis Riley, MD;  Location: [place];  Service: General;  Laterality: N/A;   BACK SURGERY     CARPAL TUNNEL RELEASE Right    CARPAL TUNNEL RELEASE Left 08/13/2016   Procedure: LEFT CARPAL TUNNEL RELEASE;  Surgeon: Roseanne Kaufman, MD;  Location: [place];  Service: Orthopedics;  Laterality: Left;   COLECTOMY WITH COLOSTOMY CREATION/HARTMANN PROCEDURE N/A 02/09/2019   Procedure: HARTMANN PROCEDURE;  Surgeon: Clovis Riley, MD;  Location: [place];  Service: General;  Laterality: N/A;   COLONOSCOPY     COLOSTOMY N/A 02/09/2019   Procedure: COLOSTOMY;  Surgeon: Clovis Riley, MD;  Location: [place];  Service: General;  Laterality: N/A;   COLOSTOMY REVERSAL N/A 07/30/2019   Procedure: LAPAROSCOPIC ASSISTED REVERSAL OF END COLOSTOMY WITH RIGID PROCTOSCOPY;  Surgeon: Clovis Riley, MD;  [facility];  Service: General;  Laterality: N/A;   CORONARY STENT PLACEMENT     2 stents in the same artery   ENDOMETRIAL ABLATION  2005   [place] LITHOTRIPSY Right 06/27/2017   Procedure: RIGHT EXTRACORPOREAL SHOCK WAVE LITHOTRIPSY (ESWL);  Surgeon: Franchot Gallo, MD;  [facility];  Service: Urology;  Laterality: Right;   INCISIONAL HERNIA REPAIR N/A 10/10/2020   Procedure: LAPAROSCOPIC  WITH TRASITION TO OPEN INCISIONAL HERNIA REPAIR WITH MESH;  Surgeon: Clovis Riley, MD;  [facility];  Service: General;  Laterality: N/A;   KNEE ARTHROSCOPY WITH MENISCAL REPAIR Right    MOHS SURGERY     TUBAL LIGATION  1991    MEDICATIONS:  aspirin 325 MG tablet   calcium carbonate (OSCAL) 1500 (600 Ca) MG TABS tablet   Cholecalciferol 125 MCG (5000 UT) TABS   Coenzyme Q10 100 MG TABS   docusate sodium (COLACE) 100 MG capsule   DULoxetine (CYMBALTA) 60 MG capsule   hydrochlorothiazide (HYDRODIURIL) 25 MG tablet   HYDROcodone-acetaminophen (NORCO)  5-325 MG tablet   ibuprofen (ADVIL) 200 MG tablet   icosapent Ethyl (VASCEPA) 1 g capsule   losartan-hydrochlorothiazide (HYZAAR) 100-25 MG tablet   metoprolol tartrate (LOPRESSOR) 25 MG tablet   Multiple Vitamin (MULTIVITAMIN) tablet   nitroGLYCERIN (NITROSTAT) 0.4 MG SL tablet   rosuvastatin (CRESTOR) 20 MG tablet   Semaglutide,0.25 or 0.5MG /DOS, (OZEMPIC, 0.25 OR 0.5 MG/DOSE,) 2 MG/1.5ML SOPN   traZODone (DESYREL) 50 MG tablet   varenicline (CHANTIX) 1 MG tablet   No current facility-administered medications for this encounter.    Konrad Felix Ward, PA-C [facility] [phone]

## 2021-08-25 ENCOUNTER — Other Ambulatory Visit (HOSPITAL_COMMUNITY): Payer: Self-pay

## 2021-08-25 ENCOUNTER — Other Ambulatory Visit: Payer: Self-pay | Admitting: Family Medicine

## 2021-08-25 ENCOUNTER — Encounter: Payer: Self-pay | Admitting: Family Medicine

## 2021-08-25 DIAGNOSIS — I10 Essential (primary) hypertension: Secondary | ICD-10-CM

## 2021-08-25 MED ORDER — HYDROCHLOROTHIAZIDE 25 MG PO TABS
25.0000 mg | ORAL_TABLET | Freq: Every day | ORAL | 2 refills | Status: DC
  Filled 2021-08-25: qty 90, 90d supply, fill #0
  Filled 2021-11-18: qty 90, 90d supply, fill #1
  Filled 2022-02-12: qty 90, 90d supply, fill #2

## 2021-08-27 ENCOUNTER — Other Ambulatory Visit: Payer: Self-pay | Admitting: Family Medicine

## 2021-08-28 ENCOUNTER — Ambulatory Visit (INDEPENDENT_AMBULATORY_CARE_PROVIDER_SITE_OTHER): Payer: No Typology Code available for payment source | Admitting: Family Medicine

## 2021-08-28 ENCOUNTER — Encounter: Payer: Self-pay | Admitting: Family Medicine

## 2021-08-28 DIAGNOSIS — U071 COVID-19: Secondary | ICD-10-CM

## 2021-08-28 DIAGNOSIS — J069 Acute upper respiratory infection, unspecified: Secondary | ICD-10-CM

## 2021-08-28 MED ORDER — MOLNUPIRAVIR EUA 200MG CAPSULE
4.0000 | ORAL_CAPSULE | Freq: Two times a day (BID) | ORAL | 0 refills | Status: AC
Start: 2021-08-28 — End: 2021-09-02

## 2021-08-28 NOTE — Progress Notes (Signed)
Virtual Visit via telephone Note Due to COVID-19 pandemic this visit was conducted virtually. This visit type was conducted due to national recommendations for restrictions regarding the COVID-19 Pandemic (e.g. social distancing, sheltering in place) in an effort to limit this patient's exposure and mitigate transmission in our community. All issues noted in this document were discussed and addressed.  A physical exam was not performed with this format.   I connected with Heather Lam on 08/28/2021 at [place] by telephone and verified that I am speaking with the correct person using two identifiers. Heather Lam is currently located at home and patient is currently with them during visit. The provider, Monia Pouch, FNP is located in their office at time of visit.  I discussed the limitations, risks, security and privacy concerns of performing an evaluation and management service by virtual visit and the availability of in person appointments. I also discussed with the patient that there may be a patient responsible charge related to this service. The patient expressed understanding and agreed to proceed.  Subjective:  Patient ID: Heather Lam, female    DOB: 1960/12/07, 61 y.o.   MRN: [id number]  Chief Complaint:  Covid Positive   HPI: Heather Lam is a 61 y.o. female presenting on 08/28/2021 for Covid Positive   URI  This is a new problem. The current episode started yesterday. The problem has been gradually worsening. Associated symptoms include congestion, coughing, headaches, rhinorrhea and a sore throat. Pertinent negatives include no abdominal pain, chest pain, diarrhea, dysuria, ear pain, joint pain, joint swelling, nausea, neck pain, plugged ear sensation, rash, sinus pain, sneezing, swollen glands, vomiting or wheezing. She has tried acetaminophen for the symptoms. The treatment provided mild relief.    Relevant past medical, surgical, family, and social history reviewed and  updated as indicated.  Allergies and medications reviewed and updated.   Past Medical History:  Diagnosis Date   Arthritis    Back pain    CAD (coronary artery disease)    Chest pain    Complication of anesthesia    Constipation    Coronary arteriosclerosis 05/04/2016   Diverticulitis    Diverticulosis    Family history of adverse reaction to anesthesia    Mom PONV   Heart attack ([place]) 2010   History of carpal tunnel syndrome    Bilateral   History of kidney stones    Hyperlipidemia    Hypertension    Intra-abdominal abscess (HCC)    Joint pain    Morbid obesity ([place])    Myocardial infarction ([place]) 05/04/2009   PONV (postoperative nausea and vomiting)    Skin cancer    on nose   Sleep apnea    not using cpap at this time   Vitamin D deficiency     Past Surgical History:  Procedure Laterality Date   APPENDECTOMY     APPLICATION OF WOUND VAC N/A 02/09/2019   Procedure: APPLICATION OF WOUND VAC;  Surgeon: Clovis Riley, MD;  Location: [place];  Service: General;  Laterality: N/A;   BACK SURGERY     CARPAL TUNNEL RELEASE Right    CARPAL TUNNEL RELEASE Left 08/13/2016   Procedure: LEFT CARPAL TUNNEL RELEASE;  Surgeon: Roseanne Kaufman, MD;  Location: [place];  Service: Orthopedics;  Laterality: Left;   COLECTOMY WITH COLOSTOMY CREATION/HARTMANN PROCEDURE N/A 02/09/2019   Procedure: HARTMANN PROCEDURE;  Surgeon: Clovis Riley, MD;  Location: [place];  Service: General;  Laterality: N/A;  COLONOSCOPY     COLOSTOMY N/A 02/09/2019   Procedure: COLOSTOMY;  Surgeon: Clovis Riley, MD;  Location: [place];  Service: General;  Laterality: N/A;   COLOSTOMY REVERSAL N/A 07/30/2019   Procedure: LAPAROSCOPIC ASSISTED REVERSAL OF END COLOSTOMY WITH RIGID PROCTOSCOPY;  Surgeon: Clovis Riley, MD;  [facility];  Service: General;  Laterality: N/A;   CORONARY STENT PLACEMENT     2 stents in the same artery   ENDOMETRIAL ABLATION  2005   [place] LITHOTRIPSY Right 06/27/2017   Procedure: RIGHT EXTRACORPOREAL SHOCK WAVE LITHOTRIPSY (ESWL);  Surgeon: Franchot Gallo, MD;  [facility];  Service: Urology;  Laterality: Right;   INCISIONAL HERNIA REPAIR N/A 10/10/2020   Procedure: LAPAROSCOPIC  WITH TRASITION TO OPEN INCISIONAL HERNIA REPAIR WITH MESH;  Surgeon: Clovis Riley, MD;  [facility];  Service: General;  Laterality: N/A;   KNEE ARTHROSCOPY WITH MENISCAL REPAIR Right    MOHS SURGERY     TUBAL LIGATION  1991    Social History   Socioeconomic History   Marital status: Divorced    Spouse name: Not on file   Number of children: Not on file   Years of education: Not on file   Highest education level: Not on file  Occupational History   Occupation: Nurse    Employer: [employer]    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[employer] Use   Smoking status: Former    Packs/day: 1.00    Years: 20.00    Pack years: 20.00    Types: Cigarettes    Quit date: 2010    Years since quitting: 13.0   Smokeless tobacco: Never  Vaping Use   Vaping Use: Never used  Substance and Sexual Activity   Alcohol use: Not Currently    Comment: Rare    Drug use: No   Sexual activity: Yes    Birth control/protection: Post-menopausal  Other Topics Concern   Not on file  Social History Narrative   Caffeine daily    Social Determinants of Health   Financial Resource Strain: Not on file  Food Insecurity: Not on file  Transportation Needs: Not on file  Physical Activity: Not on file  Stress: Not on file  Social Connections: Not on file  Intimate Partner Violence: Not on file    Outpatient Encounter Medications as of 08/28/2021  Medication Sig   molnupiravir EUA (LAGEVRIO) 200 mg CAPS capsule Take 4 capsules (800 mg total) by mouth 2 (two) times daily for 5 days.   aspirin 325 MG tablet Take 325 mg by mouth in the morning.   calcium carbonate (OSCAL) 1500 (600 Ca) MG TABS tablet Take 600 mg of elemental  calcium by mouth every evening.   Cholecalciferol 125 MCG (5000 UT) TABS Take 5,000 Units by mouth every evening.   Coenzyme Q10 100 MG TABS Take 100 mg by mouth every evening.   docusate sodium (COLACE) 100 MG capsule Take 200 mg by mouth in the morning.   DULoxetine (CYMBALTA) 60 MG capsule Take 1 capsule (60 mg total) by mouth daily.   hydrochlorothiazide (HYDRODIURIL) 25 MG tablet Take 1 tablet (25 mg total) by mouth daily.   HYDROcodone-acetaminophen (NORCO) 5-325 MG tablet Take 1 tablet by mouth every 6 (six) hours as needed for moderate pain.   ibuprofen (ADVIL) 200 MG tablet Take 800 mg by mouth 2 (two) times daily as needed (pain.).   icosapent Ethyl (VASCEPA) 1 g capsule TAKE 2 CAPULES  BY MOUTH TWICE DAILY   losartan-hydrochlorothiazide (HYZAAR) 100-25 MG tablet TAKE 1 TABLET BY MOUTH DAILY.   metoprolol tartrate (LOPRESSOR) 25 MG tablet TAKE 1 TABLET BY MOUTH 2 TIMES DAILY   Multiple Vitamin (MULTIVITAMIN) tablet Take 1 tablet by mouth every evening.   nitroGLYCERIN (NITROSTAT) 0.4 MG SL tablet DISSOLVE 1 TABLET UNDER TONGUE EVERY 5 MINUTES UP TO 3 TIMES FOR CHEST PAIN THEN CALL DR IF NO RELIEF   rosuvastatin (CRESTOR) 20 MG tablet Take 1 tablet (20 mg total) by mouth 4 (four) times a week. (Patient taking differently: Take 20 mg by mouth every Tuesday, Thursday, Saturday, and Sunday.)   Semaglutide,0.25 or 0.5MG /DOS, (OZEMPIC, 0.25 OR 0.5 MG/DOSE,) 2 MG/1.5ML SOPN Inject 0.25 mg into the skin once a week for 14 days, THEN 0.5 mg once a week.   traZODone (DESYREL) 50 MG tablet TAKE 1 TO 2 TABLETS BY MOUTH AT BEDTIME AS NEEDED FOR SLEEP (Patient taking differently: Take 50 mg by mouth at bedtime.)   varenicline (CHANTIX) 1 MG tablet TAKE  (1)  TABLET TWICE A DAY. (Patient not taking: Reported on 08/14/2021)   [DISCONTINUED] gabapentin (NEURONTIN) 300 MG capsule Take 1 capsule (300 mg total) by mouth every 8 (eight) hours as needed.   No facility-administered encounter medications on file  as of 08/28/2021.    No Known Allergies  Review of Systems  Constitutional:  Positive for activity change, appetite change, chills, fatigue and fever. Negative for diaphoresis and unexpected weight change.  HENT:  Positive for congestion, postnasal drip, rhinorrhea and sore throat. Negative for dental problem, drooling, ear discharge, ear pain, facial swelling, hearing loss, mouth sores, nosebleeds, sinus pressure, sinus pain, sneezing, tinnitus, trouble swallowing and voice change.   Respiratory:  Positive for cough. Negative for shortness of breath, wheezing and stridor.   Cardiovascular:  Negative for chest pain, palpitations and leg swelling.  Gastrointestinal:  Negative for abdominal pain, diarrhea, nausea and vomiting.  Genitourinary:  Negative for decreased urine volume, difficulty urinating and dysuria.  Musculoskeletal:  Positive for myalgias. Negative for joint pain and neck pain.  Skin:  Negative for rash.  Neurological:  Positive for headaches. Negative for dizziness, tremors, seizures, syncope, facial asymmetry, speech difficulty, weakness, light-headedness and numbness.  Psychiatric/Behavioral:  Negative for confusion.   All other systems reviewed and are negative.       Observations/Objective: No vital signs or physical exam, this was a virtual health encounter.  Pt alert and oriented, answers all questions appropriately, and able to speak in full sentences.    Assessment and Plan: Margean was seen today for covid positive.  Diagnoses and all orders for this visit:  URI with cough and congestion Positive self-administered antigen test for COVID-19 COVID positive. Will initiate antiviral therapy due to comorbidities. Symptomatic care discussed in detail. Pt aware to report any new, worsening, or persistent symptoms.  -     molnupiravir EUA (LAGEVRIO) 200 mg CAPS capsule; Take 4 capsules (800 mg total) by mouth 2 (two) times daily for 5 days.     Follow Up  Instructions: Return if symptoms worsen or fail to improve.    I discussed the assessment and treatment plan with the patient. The patient was provided an opportunity to ask questions and all were answered. The patient agreed with the plan and demonstrated an understanding of the instructions.   The patient was advised to call back or seek an in-person evaluation if the symptoms worsen or if the condition fails to improve as anticipated.  The above assessment and management plan was discussed with the patient. The patient verbalized understanding of and has agreed to the management plan. Patient is aware to call the clinic if they develop any new symptoms or if symptoms persist or worsen. Patient is aware when to return to the clinic for a follow-up visit. Patient educated on when it is appropriate to go to the emergency department.    I provided 15 minutes of time during this telephone encounter.   Monia Pouch, FNP-C [facility] [address] [place], [place] [address] [phone] 08/28/2021

## 2021-08-30 ENCOUNTER — Other Ambulatory Visit: Payer: Self-pay | Admitting: Family Medicine

## 2021-08-30 DIAGNOSIS — U071 COVID-19: Secondary | ICD-10-CM

## 2021-08-30 MED ORDER — ALBUTEROL SULFATE HFA 108 (90 BASE) MCG/ACT IN AERS: 2.0000 | INHALATION_SPRAY | Freq: Four times a day (QID) | RESPIRATORY_TRACT | 2 refills | Status: DC | PRN

## 2021-08-30 MED ORDER — AMOXICILLIN-POT CLAVULANATE 875-125 MG PO TABS: 1.0000 | ORAL_TABLET | Freq: Two times a day (BID) | ORAL | 0 refills | Status: AC

## 2021-08-30 MED ORDER — AZITHROMYCIN 250 MG PO TABS: ORAL_TABLET | ORAL | 0 refills | Status: DC

## 2021-08-30 MED ORDER — PREDNISONE 20 MG PO TABS: ORAL_TABLET | ORAL | 0 refills | Status: DC

## 2021-09-02 DIAGNOSIS — M1612 Unilateral primary osteoarthritis, left hip: Secondary | ICD-10-CM

## 2021-09-16 ENCOUNTER — Encounter: Payer: Self-pay | Admitting: Family Medicine

## 2021-09-16 ENCOUNTER — Other Ambulatory Visit (HOSPITAL_COMMUNITY)
Admission: RE | Admit: 2021-09-16 | Discharge: 2021-09-16 | Disposition: A | Discharge status: Home / Self Care | Payer: No Typology Code available for payment source | Source: Ambulatory Visit | Attending: Family Medicine | Admitting: Family Medicine

## 2021-09-16 ENCOUNTER — Ambulatory Visit (INDEPENDENT_AMBULATORY_CARE_PROVIDER_SITE_OTHER): Payer: No Typology Code available for payment source | Admitting: Family Medicine

## 2021-09-16 VITALS — BP 130/66 | HR 73 | Temp 97.4°F | Ht 62.0 in | Wt 178.0 lb

## 2021-09-16 DIAGNOSIS — L918 Other hypertrophic disorders of the skin: Secondary | ICD-10-CM

## 2021-09-16 DIAGNOSIS — D229 Melanocytic nevi, unspecified: Secondary | ICD-10-CM | POA: Diagnosis present

## 2021-09-16 DIAGNOSIS — L989 Disorder of the skin and subcutaneous tissue, unspecified: Secondary | ICD-10-CM | POA: Diagnosis present

## 2021-09-16 NOTE — Progress Notes (Signed)
Subjective:  Patient ID: Heather Lam, female    DOB: [birth year], 61 y.o.   MRN: [id number]  Patient Care Team: [name], DO as PCP - General (Family Medicine) Belva Crome, MD as PCP - Cardiology (Cardiology) Lavera Guise, [facility] (Pharmacist)   Chief Complaint:  Abnormal Mole   HPI: Heather Lam is a 61 y.o. female presenting on 09/16/2021 for Abnormal Mole   Pt presents today with complaints of several skin lesions to right lateral neck which are irritated and have changed in color and size. She states her bra strap will get hung on a few of them causing irritation, pain, and bleeding. She denies any other associated symptoms.    Relevant past medical, surgical, family, and social history reviewed and updated as indicated.  Allergies and medications reviewed and updated. Data reviewed: Chart in Epic.   Past Medical History:  Diagnosis Date   Arthritis    Back pain    CAD (coronary artery disease)    Chest pain    Complication of anesthesia    Constipation    Coronary arteriosclerosis 05/04/2016   Diverticulitis    Diverticulosis    Family history of adverse reaction to anesthesia    Mom PONV   Heart attack ([place]) 2010   History of carpal tunnel syndrome    Bilateral   History of kidney stones    Hyperlipidemia    Hypertension    Intra-abdominal abscess (H[facility])    Joint pain    Morbid obesity ([place])    Myocardial infarction ([place]) 05/04/2009   PONV (postoperative nausea and vomiting)    Skin cancer    on nose   Sleep apnea    not using cpap at this time   Vitamin D deficiency     Past Surgical History:  Procedure Laterality Date   APPENDECTOMY     APPLICATION OF WOUND VAC N/A 02/09/2019   Procedure: APPLICATION OF WOUND VAC;  Surgeon: Clovis Riley, MD;  Location: [place];  Service: General;  Laterality: N/A;   BACK SURGERY     CARPAL TUNNEL RELEASE Right    CARPAL TUNNEL RELEASE Left 08/13/2016   Procedure: LEFT CARPAL TUNNEL RELEASE;   Surgeon: Roseanne Kaufman, MD;  Location: [place];  Service: Orthopedics;  Laterality: Left;   COLECTOMY WITH COLOSTOMY CREATION/HARTMANN PROCEDURE N/A 02/09/2019   Procedure: HARTMANN PROCEDURE;  Surgeon: Clovis Riley, MD;  Location: [place];  Service: General;  Laterality: N/A;   COLONOSCOPY     COLOSTOMY N/A 02/09/2019   Procedure: COLOSTOMY;  Surgeon: Clovis Riley, MD;  Location: [place];  Service: General;  Laterality: N/A;   COLOSTOMY REVERSAL N/A 07/30/2019   Procedure: LAPAROSCOPIC ASSISTED REVERSAL OF END COLOSTOMY WITH RIGID PROCTOSCOPY;  Surgeon: Clovis Riley, MD;  Location: [facility];  Service: General;  Laterality: N/A;   CORONARY STENT PLACEMENT     2 stents in the same artery   ENDOMETRIAL ABLATION  2005   [place] LITHOTRIPSY Right 06/27/2017   Procedure: RIGHT EXTRACORPOREAL SHOCK WAVE LITHOTRIPSY (ESWL);  Surgeon: Franchot Gallo, MD;  Location: [facility];  Service: Urology;  Laterality: Right;   INCISIONAL HERNIA REPAIR N/A 10/10/2020   Procedure: LAPAROSCOPIC  WITH TRASITION TO OPEN INCISIONAL HERNIA REPAIR WITH MESH;  Surgeon: Clovis Riley, MD;  Location: [facility];  Service: General;  Laterality: N/A;   KNEE ARTHROSCOPY WITH MENISCAL REPAIR Right    MOHS SURGERY  TUBAL LIGATION  1991    Social History   Socioeconomic History   Marital status: Divorced    Spouse name: Not on file   Number of children: Not on file   Years of education: Not on file   Highest education level: Not on file  Occupational History   Occupation: Nurse    Employer: [employer]    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[employer] Use   Smoking status: Former    Packs/day: 1.00    Years: 20.00    Pack years: 20.00    Types: Cigarettes    Quit date: 2010    Years since quitting: 13.1   Smokeless tobacco: Never  Vaping Use   Vaping Use: Never used  Substance and Sexual Activity   Alcohol use: Not Currently    Comment: Rare     Drug use: No   Sexual activity: Yes    Birth control/protection: Post-menopausal  Other Topics Concern   Not on file  Social History Narrative   Caffeine daily    Social Determinants of Health   Financial Resource Strain: Not on file  Food Insecurity: Not on file  Transportation Needs: Not on file  Physical Activity: Not on file  Stress: Not on file  Social Connections: Not on file  Intimate Partner Violence: Not on file    Outpatient Encounter Medications as of 09/16/2021  Medication Sig   albuterol (VENTOLIN HFA) 108 (90 Base) MCG/ACT inhaler Inhale 2 puffs into the lungs every 6 (six) hours as needed for wheezing or shortness of breath.   aspirin 325 MG tablet Take 325 mg by mouth in the morning.   azithromycin (ZITHROMAX Z-PAK) 250 MG tablet As directed   calcium carbonate (OSCAL) 1500 (600 Ca) MG TABS tablet Take 600 mg of elemental calcium by mouth every evening.   Cholecalciferol 125 MCG (5000 UT) TABS Take 5,000 Units by mouth every evening.   Coenzyme Q10 100 MG TABS Take 100 mg by mouth every evening.   docusate sodium (COLACE) 100 MG capsule Take 200 mg by mouth in the morning.   DULoxetine (CYMBALTA) 60 MG capsule Take 1 capsule (60 mg total) by mouth daily.   hydrochlorothiazide (HYDRODIURIL) 25 MG tablet Take 1 tablet (25 mg total) by mouth daily.   HYDROcodone-acetaminophen (NORCO) 5-325 MG tablet Take 1 tablet by mouth every 6 (six) hours as needed for moderate pain.   ibuprofen (ADVIL) 200 MG tablet Take 800 mg by mouth 2 (two) times daily as needed (pain.).   icosapent Ethyl (VASCEPA) 1 g capsule TAKE 2 CAPULES BY MOUTH TWICE DAILY   losartan-hydrochlorothiazide (HYZAAR) 100-25 MG tablet TAKE 1 TABLET BY MOUTH DAILY.   metoprolol tartrate (LOPRESSOR) 25 MG tablet TAKE 1 TABLET BY MOUTH 2 TIMES DAILY   Multiple Vitamin (MULTIVITAMIN) tablet Take 1 tablet by mouth every evening.   nitroGLYCERIN (NITROSTAT) 0.4 MG SL tablet DISSOLVE 1 TABLET UNDER TONGUE EVERY 5  MINUTES UP TO 3 TIMES FOR CHEST PAIN THEN CALL DR IF NO RELIEF   predniSONE (DELTASONE) 20 MG tablet 2 po at sametime daily for 5 days- start tomorrow   rosuvastatin (CRESTOR) 20 MG tablet Take 1 tablet (20 mg total) by mouth 4 (four) times a week. (Patient taking differently: Take 20 mg by mouth every Tuesday, Thursday, Saturday, and Sunday.)   Semaglutide,0.25 or 0.5MG /DOS, (OZEMPIC, 0.25 OR 0.5 MG/DOSE,) 2 MG/1.5ML SOPN Inject 0.25 mg into the skin once a week for 14 days, THEN 0.5 mg once  a week.   traZODone (DESYREL) 50 MG tablet TAKE 1 TO 2 TABLETS BY MOUTH AT BEDTIME AS NEEDED FOR SLEEP (Patient taking differently: Take 50 mg by mouth at bedtime.)   varenicline (CHANTIX) 1 MG tablet TAKE  (1)  TABLET TWICE A DAY. (Patient not taking: Reported on 08/14/2021)   [DISCONTINUED] gabapentin (NEURONTIN) 300 MG capsule Take 1 capsule (300 mg total) by mouth every 8 (eight) hours as needed.   No facility-administered encounter medications on file as of 09/16/2021.    No Known Allergies  Review of Systems  Skin:  Positive for color change. Negative for pallor, rash and wound.       lesion  All other systems reviewed and are negative.      Objective:  BP 130/66    Pulse 73    Temp (!) 97.4 F (36.3 C) (Temporal)    Ht 5\' 2"  (1.575 m)    Wt 178 lb (80.7 kg)    LMP 12/31/2013    BMI 32.56 kg/m    Wt Readings from Last 3 Encounters:  09/16/21 178 lb (80.7 kg)  08/20/21 144 lb (65.3 kg)  07/09/21 181 lb (82.1 kg)    Physical Exam Vitals and nursing note reviewed.  Constitutional:      General: She is not in acute distress.    Appearance: Normal appearance. She is not ill-appearing, toxic-appearing or diaphoretic.  HENT:     Head: Normocephalic and atraumatic.  Eyes:     Pupils: Pupils are equal, round, and reactive to light.  Cardiovascular:     Rate and Rhythm: Normal rate and regular rhythm.     Heart sounds: Normal heart sounds.  Skin:    General: Skin is warm and dry.      Capillary Refill: Capillary refill takes less than 2 seconds.       Neurological:     Mental Status: She is alert.  Psychiatric:        Mood and Affect: Mood normal.        Behavior: Behavior normal.        Thought Content: Thought content normal.        Judgment: Judgment normal.    Results for orders placed or performed during the hospital encounter of 08/20/21  Surgical pcr screen   Specimen: Nasal Mucosa; Nasal Swab  Result Value Ref Range   MRSA, PCR NEGATIVE NEGATIVE   Staphylococcus aureus NEGATIVE NEGATIVE  Hemoglobin A1c  Result Value Ref Range   Hgb A1c MFr Bld 5.7 (H) 4.8 - 5.6 %   Mean Plasma Glucose 116.89 mg/dL  CBC  Result Value Ref Range   WBC 9.9 4.0 - 10.5 K/uL   RBC 5.09 3.87 - 5.11 MIL/uL   Hemoglobin 14.8 12.0 - 15.0 g/dL   HCT 45.4 36.0 - 46.0 %   MCV 89.2 80.0 - 100.0 fL   MCH 29.1 26.0 - 34.0 pg   MCHC 32.6 30.0 - 36.0 g/dL   RDW 12.6 11.5 - 15.5 %   Platelets 253 150 - 400 K/uL   nRBC 0.0 0.0 - 0.2 %  Comprehensive metabolic panel  Result Value Ref Range   Sodium 138 135 - 145 mmol/L   Potassium 4.3 3.5 - 5.1 mmol/L   Chloride 102 98 - 111 mmol/L   CO2 29 22 - 32 mmol/L   Glucose, Bld 107 (H) 70 - 99 mg/dL   BUN 22 (H) 6 - 20 mg/dL   Creatinine, Ser 0.58 0.44 - 1.00 mg/dL  Calcium 10.0 8.9 - 10.3 mg/dL   Total Protein 7.7 6.5 - 8.1 g/dL   Albumin 4.3 3.5 - 5.0 g/dL   AST 19 15 - 41 U/L   ALT 19 0 - 44 U/L   Alkaline Phosphatase 67 38 - 126 U/L   Total Bilirubin 0.3 0.3 - 1.2 mg/dL   GFR, Estimated >60 >60 mL/min   Anion gap 7 5 - 15  Protime-INR  Result Value Ref Range   Prothrombin Time 12.5 11.4 - 15.2 seconds   INR 0.9 0.8 - 1.2  Type and screen Order type and screen if day of surgery is less than 15 days from draw of preadmission visit or order morning of surgery if day of surgery is greater than 6 days from preadmission visit.  Result Value Ref Range   ABO/RH(D) O POS    Antibody Screen NEG    Sample Expiration 09/03/2021,2359     Extend sample reason      NO TRANSFUSIONS OR PREGNANCY IN THE PAST 3 MONTHS Performed at [facility], [place] [address] [place], [place] [address]    Skin excision  Date/Time: 09/16/2021 3:33 PM Performed by: Baruch Gouty, FNP Authorized by: Baruch Gouty, FNP   Number of Lesions: 4 Lesion 1:    Body area: head/neck   Head/neck location: right lateral neck.   Initial size (mm): 4   Final defect size (mm): 4   Malignancy: malignancy unknown     Destruction method: scissors used for extraction     Repair comments: Bandage applied post removal and lesion sent for testing Lesion 2:    Body area: head/neck   Head/neck location: right lateral neck.   Initial size (mm): 0.5   Final defect size (mm): 0.5   Malignancy: malignancy unknown     Destruction method: scissors used for extraction     Repair comments: Bandage applied after procedure. Lesion 3:    Body area: head/neck   Head/neck location: right lateral neck.   Initial size (mm): 1   Final defect size (mm): 1   Malignancy: malignancy unknown     Destruction method: scissors used for extraction     Repair comments: Bandage applied after removal Lesion 4:    Body area: head/neck   Head/neck location: right lateral neck.   Initial size (mm): 1.5   Final defect size (mm): 1.5   Malignancy: malignancy unknown     Destruction method: scissors used for extraction     Repair comments: Bandage applied after removal   Cryo to two lesions on right lateral neck, three freeze thaw cycles. Pt tolerated well.   Pertinent labs & imaging results that were available during my care of the patient were reviewed by me and considered in my medical decision making.  Assessment & Plan:  Heather Lam was seen today for abnormal mole.  Diagnoses and all orders for this visit:  Atypical mole Skin lesion of neck Inflamed skin tag 4 mm lesion removed and sent to for pathology. 3 other lesions removed via scissor excision.  Seborrheic keratosis removed via cryotherapy. Post removal care discussed in detail.  -     Surgical pathology -     Skin excision    Continue all other maintenance medications.  Follow up plan: Return if symptoms worsen or fail to improve.   Continue healthy lifestyle choices, including diet (rich in fruits, vegetables, and lean proteins, and low in salt and simple carbohydrates) and exercise (at least 30 minutes  of moderate physical activity daily).  Educational handout given for excision of skin lesions aftercare  The above assessment and management plan was discussed with the patient. The patient verbalized understanding of and has agreed to the management plan. Patient is aware to call the clinic if they develop any new symptoms or if symptoms persist or worsen. Patient is aware when to return to the clinic for a follow-up visit. Patient educated on when it is appropriate to go to the emergency department.   Monia Pouch, FNP-C [facility] [phone]

## 2021-09-18 ENCOUNTER — Other Ambulatory Visit: Payer: Self-pay | Admitting: Family Medicine

## 2021-09-18 DIAGNOSIS — M1612 Unilateral primary osteoarthritis, left hip: Secondary | ICD-10-CM

## 2021-09-18 LAB — SURGICAL PATHOLOGY

## 2021-09-18 MED ORDER — KETOROLAC TROMETHAMINE 60 MG/2ML IM SOLN: 60.0000 mg | Freq: Once | INTRAMUSCULAR | Status: AC

## 2021-09-18 NOTE — Progress Notes (Signed)
Placed order for Toradol for patient for left hip osteoarthritis

## 2021-09-29 NOTE — H&P (Signed)
TOTAL HIP ADMISSION H&P  Patient is admitted for left total hip arthroplasty.  Subjective:  Chief Complaint: Left hip pain  HPI: Heather Lam, 61 y.o. female, has a history of pain and functional disability in the left hip due to arthritis and patient has failed non-surgical conservative treatments for greater than 12 weeks to include NSAID's and/or analgesics and activity modification. Onset of symptoms was gradual, starting  several  years ago with gradually worsening course since that time. The patient noted no past surgery on the left hip. Patient currently rates pain in the left hip at 7 out of 10 with activity. Patient has night pain, worsening of pain with activity and weight bearing, pain that interfers with activities of daily living, and pain with passive range of motion. Patient has evidence of  near bone-on-bone arthritis with large marginal osteophytes  by imaging studies. This condition presents safety issues increasing the risk of falls. There is no current active infection.  Patient Active Problem List   Diagnosis Date Noted   S/P hernia surgery 10/10/2020   Morbid obesity ([place])    History of colostomy reversal 07/30/2019   Cyst of ovary 02/20/2019   Kidney stone 02/20/2019   Migraine 02/20/2019   Diverticulitis 01/27/2019   Actinic keratoses 05/04/2017   History of MI (myocardial infarction) 04/01/2017   Prediabetes 09/28/2016   Vitamin D deficiency 08/16/2016   Recurrent major depressive disorder, in partial remission ([place]) 05/25/2016   Diverticular disease 05/04/2016   Malignant neoplasm of skin 05/04/2016   Carpal tunnel syndrome 02/26/2016   Obesity (BMI 30-39.9) 02/25/2016   Insomnia 07/01/2015   Essential hypertension 05/21/2015   Generalized anxiety disorder 06/06/2013   CAD S/P percutaneous coronary angioplasty 09/13/2011   Antiplatelet or antithrombotic long-term use 09/13/2011   OSA on CPAP 09/13/2011   Hyperlipidemia 09/13/2011    Past Medical  History:  Diagnosis Date   Arthritis    Back pain    CAD (coronary artery disease)    Chest pain    Complication of anesthesia    Constipation    Coronary arteriosclerosis 05/04/2016   Diverticulitis    Diverticulosis    Family history of adverse reaction to anesthesia    Mom PONV   Heart attack ([place]) 2010   History of carpal tunnel syndrome    Bilateral   History of kidney stones    Hyperlipidemia    Hypertension    Intra-abdominal abscess ([facility])    Joint pain    Morbid obesity ([place])    Myocardial infarction ([place]) 05/04/2009   PONV (postoperative nausea and vomiting)    Skin cancer    on nose   Sleep apnea    not using cpap at this time   Vitamin D deficiency     Past Surgical History:  Procedure Laterality Date   APPENDECTOMY     APPLICATION OF WOUND VAC N/A 02/09/2019   Procedure: APPLICATION OF WOUND VAC;  Surgeon: Clovis Riley, MD;  Location: [facility];  Service: General;  Laterality: N/A;   BACK SURGERY     CARPAL TUNNEL RELEASE Right    CARPAL TUNNEL RELEASE Left 08/13/2016   Procedure: LEFT CARPAL TUNNEL RELEASE;  Surgeon: Roseanne Kaufman, MD;  Location: [place];  Service: Orthopedics;  Laterality: Left;   COLECTOMY WITH COLOSTOMY CREATION/HARTMANN PROCEDURE N/A 02/09/2019   Procedure: HARTMANN PROCEDURE;  Surgeon: Clovis Riley, MD;  Location: [place];  Service: General;  Laterality: N/A;   COLONOSCOPY     COLOSTOMY  N/A 02/09/2019   Procedure: COLOSTOMY;  Surgeon: Clovis Riley, MD;  Location: [place];  Service: General;  Laterality: N/A;   COLOSTOMY REVERSAL N/A 07/30/2019   Procedure: LAPAROSCOPIC ASSISTED REVERSAL OF END COLOSTOMY WITH RIGID PROCTOSCOPY;  Surgeon: Clovis Riley, MD;  Location: [facility];  Service: General;  Laterality: N/A;   CORONARY STENT PLACEMENT     2 stents in the same artery   ENDOMETRIAL ABLATION  2005   [place] LITHOTRIPSY Right 06/27/2017   Procedure: RIGHT EXTRACORPOREAL SHOCK WAVE  LITHOTRIPSY (ESWL);  Surgeon: Franchot Gallo, MD;  Location: [facility];  Service: Urology;  Laterality: Right;   INCISIONAL HERNIA REPAIR N/A 10/10/2020   Procedure: LAPAROSCOPIC  WITH TRASITION TO OPEN INCISIONAL HERNIA REPAIR WITH MESH;  Surgeon: Clovis Riley, MD;  Location: [facility];  Service: General;  Laterality: N/A;   KNEE ARTHROSCOPY WITH MENISCAL REPAIR Right    MOHS SURGERY     TUBAL LIGATION  1991    Prior to Admission medications   Medication Sig Start Date End Date Taking? Authorizing Provider  aspirin 325 MG tablet Take 325 mg by mouth in the morning.   Yes [provider]  calcium carbonate (OSCAL) 1500 (600 Ca) MG TABS tablet Take 600 mg of elemental calcium by mouth every evening.   Yes [provider]  Cholecalciferol 125 MCG (5000 UT) TABS Take 5,000 Units by mouth every evening.   Yes [provider]  Coenzyme Q10 100 MG TABS Take 100 mg by mouth every evening.   Yes [provider]  docusate sodium (COLACE) 100 MG capsule Take 200 mg by mouth in the morning.   Yes [provider]  DULoxetine (CYMBALTA) 60 MG capsule Take 1 capsule (60 mg total) by mouth daily. 05/12/21  Yes Gottschalk, Leatrice Jewels M, DO  HYDROcodone-acetaminophen (NORCO) 5-325 MG tablet Take 1 tablet by mouth every 6 (six) hours as needed for moderate pain. 06/30/21  Yes Gottschalk, Leatrice Jewels M, DO  ibuprofen (ADVIL) 200 MG tablet Take 800 mg by mouth 2 (two) times daily as needed (pain.).   Yes [provider]  losartan-hydrochlorothiazide (HYZAAR) 100-25 MG tablet TAKE 1 TABLET BY MOUTH DAILY. 03/27/21 03/27/22 Yes Gottschalk, Ashly M, DO  metoprolol tartrate (LOPRESSOR) 25 MG tablet TAKE 1 TABLET BY MOUTH 2 TIMES DAILY 04/24/21 04/24/22 Yes Ronnie Doss M, DO  Multiple Vitamin (MULTIVITAMIN) tablet Take 1 tablet by mouth every evening.   Yes [provider]  nitroGLYCERIN (NITROSTAT) 0.4 MG SL tablet DISSOLVE 1 TABLET UNDER TONGUE EVERY 5 MINUTES UP  TO 3 TIMES FOR CHEST PAIN THEN CALL DR IF NO RELIEF 07/29/21  Yes Gottschalk, Ashly M, DO  rosuvastatin (CRESTOR) 20 MG tablet Take 1 tablet (20 mg total) by mouth 4 (four) times a week. Patient taking differently: Take 20 mg by mouth every Tuesday, Thursday, Saturday, and Sunday. 01/27/21  Yes Belva Crome, MD  Semaglutide,0.25 or 0.5MG /DOS, (OZEMPIC, 0.25 OR 0.5 MG/DOSE,) 2 MG/1.5ML SOPN Inject 0.25 mg into the skin once a week for 14 days, THEN 0.5 mg once a week. 01/20/21 02/03/22 Yes Gottschalk, Ashly M, DO  traZODone (DESYREL) 50 MG tablet TAKE 1 TO 2 TABLETS BY MOUTH AT BEDTIME AS NEEDED FOR SLEEP Patient taking differently: Take 50 mg by mouth at bedtime. 07/08/20 08/14/22 Yes Gottschalk, Ashly M, DO  albuterol (VENTOLIN HFA) 108 (90 Base) MCG/ACT inhaler Inhale 2 puffs into the lungs every 6 (six) hours as needed for wheezing or shortness of breath. 08/30/21  Baruch Gouty, FNP  azithromycin (ZITHROMAX Z-PAK) 250 MG tablet As directed 08/30/21   Baruch Gouty, FNP  hydrochlorothiazide (HYDRODIURIL) 25 MG tablet Take 1 tablet (25 mg total) by mouth daily. 08/25/21   Baruch Gouty, FNP  icosapent Ethyl (VASCEPA) 1 g capsule TAKE 2 CAPULES BY MOUTH TWICE DAILY 08/17/21 08/17/22  Ronnie Doss M, DO  predniSONE (DELTASONE) 20 MG tablet 2 po at sametime daily for 5 days- start tomorrow 08/30/21   Baruch Gouty, FNP  varenicline (CHANTIX) 1 MG tablet TAKE  (1)  TABLET TWICE A DAY. Patient not taking: Reported on 08/14/2021 04/21/21   [name], DO  gabapentin (NEURONTIN) 300 MG capsule Take 1 capsule (300 mg total) by mouth every 8 (eight) hours as needed. 10/11/20 10/22/20  Michael Boston, MD    No Known Allergies  Social History   Socioeconomic History   Marital status: Divorced    Spouse name: Not on file   Number of children: Not on file   Years of education: Not on file   Highest education level: Not on file  Occupational History   Occupation: Nurse    Employer: [employer]     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[employer] Use   Smoking status: Former    Packs/day: 1.00    Years: 20.00    Pack years: 20.00    Types: Cigarettes    Quit date: 2010    Years since quitting: 13.1   Smokeless tobacco: Never  Vaping Use   Vaping Use: Never used  Substance and Sexual Activity   Alcohol use: Not Currently    Comment: Rare    Drug use: No   Sexual activity: Yes    Birth control/protection: Post-menopausal  Other Topics Concern   Not on file  Social History Narrative   Caffeine daily    Social Determinants of Health   Financial Resource Strain: Not on file  Food Insecurity: Not on file  Transportation Needs: Not on file  Physical Activity: Not on file  Stress: Not on file  Social Connections: Not on file  Intimate Partner Violence: Not on file    Tobacco Use: Medium Risk   Smoking Tobacco Use: Former   Smokeless Tobacco Use: Never   Passive Exposure: Not on file   Social History   Substance and Sexual Activity  Alcohol Use Not Currently   Comment: Rare     Family History  Problem Relation Age of Onset   Hyperlipidemia Mother    Hypertension Mother    Cancer Father        skin   Hyperlipidemia Father    Dementia Father    Arthritis Father    Hypertension Father    Sleep apnea Father    Other Sister        Myelofibrosis   Arthritis Sister    Hyperlipidemia Sister    Arthritis Sister    Hyperlipidemia Sister    Diabetes Paternal Grandfather    Colon cancer Neg Hx     Review of Systems  Constitutional:  Negative for chills and fever.  HENT:  Negative for congestion, sore throat and tinnitus.   Eyes:  Negative for double vision, photophobia and pain.  Respiratory:  Negative for cough, shortness of breath and wheezing.   Cardiovascular:  Negative for chest pain, palpitations and orthopnea.  Gastrointestinal:  Negative for heartburn, nausea and vomiting.  Genitourinary:  Negative for dysuria, frequency and urgency.   Musculoskeletal:  Positive  for joint pain.  Neurological:  Negative for dizziness, weakness and headaches.    Objective:  Physical Exam: Well nourished and well developed.   General: Alert and oriented x3, cooperative and pleasant, no acute distress.  Head: normocephalic, atraumatic, neck supple.  Eyes: EOMI.   Musculoskeletal: Left Hip Exam:  ROM: Flexion to 100, internal rotation is minimal, external Rotation 20, and abduction 20 without discomfort.  There is no tenderness over the greater trochanter bursa.    Calves soft and nontender. Motor function intact in LE. Strength 5/5 LE bilaterally.   Imaging Review Plain radiographs demonstrate severe degenerative joint disease of the left hip. The bone quality appears to be adequate for age and reported activity level.  Assessment/Plan:  End stage arthritis, left hip  The patient history, physical examination, clinical judgement of the provider and imaging studies are consistent with end stage degenerative joint disease of the left hip and total hip arthroplasty is deemed medically necessary. The treatment options including medical management, injection therapy, arthroscopy and arthroplasty were discussed at length. The risks and benefits of total hip arthroplasty were presented and reviewed. The risks due to aseptic loosening, infection, stiffness, dislocation/subluxation, thromboembolic complications and other imponderables were discussed. The patient acknowledged the explanation, agreed to proceed with the plan and consent was signed. Patient is being admitted for inpatient treatment for surgery, pain control, PT, OT, prophylactic antibiotics, VTE prophylaxis, progressive ambulation and ADLs and discharge planning.The patient is planning to be discharged  home .   Patient's anticipated LOS is less than 2 midnights, meeting these requirements: - Lives within 1 hour of care - Has a competent adult at home to recover with post-op  recover - NO history of  - Chronic pain requiring opiods  - Diabetes  - Heart failure  - Stroke  - DVT/VTE  - Cardiac arrhythmia  - Respiratory Failure/COPD  - Renal failure  - Anemia  - Advanced Liver disease  Therapy Plans: HEP Disposition: Home with daughter and family Planned DVT Prophylaxis: Xarelto 10 mg QD DME Needed: Gil[facility] Rile PCP: Adam Phenix, DO (clearance received - requesting cardiac clearance) Cardiologist: Daneen Schick, MD (clearance received by Ermalinda Barrios, PA-C on 12/6) TXA: IV Allergies: NKDA Anesthesia Concerns: Nausea/vomiting BMI: 33 Last HgbA1c: Not diabetic.  Pharmacy: [facility] (bring to room)  Other: - MI in 2010 with two stents - Hydrocodone 5-325 mg QD as needed (only taking once a week max)   - Patient was instructed on what medications to stop prior to surgery. - Follow-up visit in 2 weeks with Dr. Wynelle Link - Begin physical therapy following surgery - Pre-operative lab work as pre-surgical testing - Prescriptions will be provided in hospital at time of discharge  Theresa Duty, PA-C Orthopedic Surgery [facility] [place]

## 2021-10-01 ENCOUNTER — Other Ambulatory Visit: Payer: Self-pay | Admitting: Family Medicine

## 2021-10-02 ENCOUNTER — Other Ambulatory Visit (HOSPITAL_COMMUNITY): Payer: Self-pay

## 2021-10-05 ENCOUNTER — Other Ambulatory Visit (HOSPITAL_COMMUNITY): Payer: Self-pay

## 2021-10-05 MED ORDER — TRAZODONE HCL 50 MG PO TABS
ORAL_TABLET | Freq: Every evening | ORAL | 3 refills | Status: DC | PRN
  Filled 2021-10-05: qty 90, 45d supply, fill #0

## 2021-10-07 ENCOUNTER — Other Ambulatory Visit: Payer: Self-pay | Admitting: Family Medicine

## 2021-10-08 ENCOUNTER — Other Ambulatory Visit (HOSPITAL_COMMUNITY): Payer: Self-pay

## 2021-10-08 MED ORDER — LOSARTAN POTASSIUM-HCTZ 100-25 MG PO TABS
1.0000 | ORAL_TABLET | Freq: Every day | ORAL | 0 refills | Status: DC
  Filled 2021-10-08: qty 90, 90d supply, fill #0

## 2021-10-15 ENCOUNTER — Encounter (HOSPITAL_COMMUNITY): Payer: Self-pay

## 2021-10-15 NOTE — Progress Notes (Addendum)
PCP - Darla Lesches LOV 09-16-21 epic  06-09-21 clearance on chart Adam Phenix ,DO ?Cardiologist - 07-07-21 LOV Ermalinda Barrios, PA-C ? ?PPM/ICD -  ?Device Orders -  ?Rep Notified -  ? ?Chest x-ray -  ?EKG - 07-07-21 epic ?Stress Test - 2019 ?ECHO - 2020 ?Cardiac Cath -  ? ?Sleep Study -  ?CPAP -  ? ?Fasting Blood Sugar -  ?Checks Blood Sugar _____ times a day ? ?Blood Thinner Instructions: ?Aspirin Instructions: 325 asa stop 1 week ? ?ERAS Protcol - ?PRE-SURGERY G2-  ? ? ?COVID vaccine -x2 ? ?Activity--Able to walk a flight of stairs without SOB ?Anesthesia review: CAD/stents, MI,CAD, OSA ? ?Patient denies shortness of breath, fever, cough and chest pain at PAT appointment ? ? ?All instructions explained to the patient, with a verbal understanding of the material. Patient agrees to go over the instructions while at home for a better understanding. Patient also instructed to self quarantine after being tested for COVID-19. The opportunity to ask questions was provided. ?  ?

## 2021-10-16 ENCOUNTER — Other Ambulatory Visit: Payer: Self-pay | Admitting: Family Medicine

## 2021-10-16 ENCOUNTER — Other Ambulatory Visit: Payer: Self-pay

## 2021-10-16 ENCOUNTER — Encounter (HOSPITAL_COMMUNITY)
Admission: RE | Admit: 2021-10-16 | Discharge: 2021-10-16 | Disposition: A | Discharge status: Home / Self Care | Payer: No Typology Code available for payment source | Source: Ambulatory Visit | Attending: Orthopedic Surgery | Admitting: Orthopedic Surgery

## 2021-10-16 ENCOUNTER — Encounter (HOSPITAL_COMMUNITY): Payer: Self-pay

## 2021-10-16 VITALS — BP 163/78 | HR 64 | Temp 98.3°F | Resp 16 | Ht 63.0 in | Wt 190.0 lb

## 2021-10-16 DIAGNOSIS — R300 Dysuria: Secondary | ICD-10-CM

## 2021-10-16 DIAGNOSIS — Z01812 Encounter for preprocedural laboratory examination: Secondary | ICD-10-CM | POA: Diagnosis present

## 2021-10-16 DIAGNOSIS — R7303 Prediabetes: Secondary | ICD-10-CM | POA: Diagnosis not present

## 2021-10-16 LAB — HEMOGLOBIN A1C
Hgb A1c MFr Bld: 5.8 % — ABNORMAL HIGH (ref 4.8–5.6)
Mean Plasma Glucose: 119.76 mg/dL

## 2021-10-16 LAB — COMPREHENSIVE METABOLIC PANEL
ALT: 19 U/L (ref 0–44)
AST: 22 U/L (ref 15–41)
Albumin: 3.9 g/dL (ref 3.5–5.0)
Alkaline Phosphatase: 64 U/L (ref 38–126)
Anion gap: 5 (ref 5–15)
BUN: 15 mg/dL (ref 6–20)
CO2: 30 mmol/L (ref 22–32)
Calcium: 9.1 mg/dL (ref 8.9–10.3)
Chloride: 101 mmol/L (ref 98–111)
Creatinine, Ser: 0.57 mg/dL (ref 0.44–1.00)
GFR, Estimated: >60 mL/min (ref >60)
Glucose, Bld: 139 mg/dL — ABNORMAL HIGH (ref 70–99)
Potassium: 4 mmol/L (ref 3.5–5.1)
Sodium: 136 mmol/L (ref 135–145)
Total Bilirubin: 0.2 mg/dL — ABNORMAL LOW (ref 0.3–1.2)
Total Protein: 7.2 g/dL (ref 6.5–8.1)

## 2021-10-16 LAB — CBC
HCT: 42.2 % (ref 36.0–46.0)
Hemoglobin: 13.8 g/dL (ref 12.0–15.0)
MCH: 28.7 pg (ref 26.0–34.0)
MCHC: 32.7 g/dL (ref 30.0–36.0)
MCV: 87.7 fL (ref 80.0–100.0)
Platelets: 223 10*3/uL (ref 150–400)
RBC: 4.81 MIL/uL (ref 3.87–5.11)
RDW: 13.4 % (ref 11.5–15.5)
WBC: 9.6 10*3/uL (ref 4.0–10.5)
nRBC: 0 % (ref 0.0–0.2)

## 2021-10-16 LAB — SURGICAL PCR SCREEN
MRSA, PCR: NEGATIVE
Staphylococcus aureus: NEGATIVE

## 2021-10-16 LAB — GLUCOSE, CAPILLARY: Glucose-Capillary: 158 mg/dL — ABNORMAL HIGH (ref 70–99)

## 2021-10-16 MED ORDER — AMOXICILLIN-POT CLAVULANATE 875-125 MG PO TABS
1.0000 | ORAL_TABLET | Freq: Two times a day (BID) | ORAL | 0 refills | Status: DC
Start: 2021-10-16 — End: 2021-10-16

## 2021-10-16 MED ORDER — AMOXICILLIN-POT CLAVULANATE 875-125 MG PO TABS: 1.0000 | ORAL_TABLET | Freq: Two times a day (BID) | ORAL | 0 refills | Status: AC

## 2021-10-16 NOTE — Addendum Note (Signed)
Addended by: Baruch Gouty on: 10/16/2021 04:41 PM ? ? Modules accepted: Orders ? ?

## 2021-10-16 NOTE — Progress Notes (Signed)
Pt with frequency, urgency, and dysuria for three days. Prior culture reviewed and antibiotic selection based on reports.  ?

## 2021-10-16 NOTE — Patient Instructions (Signed)
DUE TO COVID-19 ONLY ONE VISITOR  (aged 61 and older)  IS ALLOWED TO COME WITH YOU AND STAY IN THE WAITING ROOM ONLY DURING PRE OP AND PROCEDURE.   ?**NO VISITORS ARE ALLOWED IN THE SHORT STAY AREA OR RECOVERY ROOM!!** ? ?IF YOU WILL BE ADMITTED INTO THE HOSPITAL YOU ARE ALLOWED ONLY TWO SUPPORT PEOPLE DURING VISITATION HOURS ONLY (7 AM -8PM)   ?The support person(s) must pass our screening, gel in and out, and wear a mask at all times, including in the patient?s room. ?Patients must also wear a mask when staff or their support person are in the room. ?Visitors GUEST BADGE MUST BE WORN VISIBLY  ?One adult visitor may remain with you overnight and MUST be in the room by 8 P.M. ?  ? ? Your procedure is scheduled on: 10-26-21 ? ? Report to [facility] Main Entrance ? ?  Report to admitting at     1100 AM ? ? Call this number if you have problems the morning of surgery [phone] ? ? Do not eat food :After Midnight. ? ? After Midnight you may have the following liquids until __1045 ____ AM/ DAY OF SURGERY then nothing by mouth ? ?Water ?Black Coffee (sugar ok, NO MILK/CREAM OR CREAMERS)  ?Tea (sugar ok, NO MILK/CREAM OR CREAMERS) regular and decaf                             ?Plain Jell-O (NO RED)                                           ?Fruit ices (not with fruit pulp, NO RED)                                     ?Popsicles (NO RED)                                                                  ?Juice: apple, WHITE grape, WHITE cranberry ?Sports drinks like Gatorade (NO RED) ?Clear broth(vegetable,chicken,beef) ? ?       ?  ?  ?The day of surgery:  ?Drink ONE (1) Pre-Surgery G2 at       1145 AM the morning of surgery. Drink in one sitting. Do not sip.  ?This drink was given to you during your hospital  ?pre-op appointment visit. ?Nothing else to drink after completing the  ?Pre-Surgery G2. ?  ?       If you have questions, please contact your surgeon?s office. ? ? ?  ?  ?Oral Hygiene is also important to  reduce your risk of infection.                                    ?Remember - BRUSH YOUR TEETH THE MORNING OF SURGERY WITH YOUR REGULAR TOOTHPASTE ? ? Do NOT smoke after Midnight ? ? Take these medicines the morning of surgery with A SIP OF WATER:  metoprolol, hydrocodone if needed, duloxentine,inhaler and bring with you ? ?DO NOT TAKE ANY ORAL DIABETIC MEDICATIONS DAY OF YOUR SURGERY ? ?Bring CPAP mask and tubing day of surgery. ?                  ?           You may not have any metal on your body including hair pins, jewelry, and body piercing ? ?           Do not wear make-up, lotions, powders, perfumes/cologne, or deodorant ? ?Do not wear nail polish including gel and S&S, artificial/acrylic nails, or any other type of covering on natural nails including finger and toenails. If you have artificial nails, gel coating, etc. that needs to be removed by a nail salon please have this removed prior to surgery or surgery may need to be canceled/ delayed if the surgeon/ anesthesia feels like they are unable to be safely monitored.  ? ?Do not shave  48 hours prior to surgery.  ? ?          ? ? Do not bring valuables to the hospital. [facility] NOT ?            RESPONSIBLE   FOR VALUABLES. ? ? Contacts, dentures or bridgework may not be worn into surgery. ? ? Bring small overnight bag day of surgery. ?  ? Patients discharged on the day of surgery will not be allowed to drive home.  Someone NEEDS to stay with you for the first 24 hours after anesthesia. ? ? Special Instructions: Bring a copy of your healthcare power of attorney and living will documents         the day of surgery if you haven't scanned them before. ? ?            Please read over the following fact sheets you were given: [facility] [place] [phone] ? ?   [facility] - Preparing for Surgery ?Before surgery, you can play an important role.  Because skin is not sterile, your skin needs to be as free of  germs as possible.  You can reduce the number of germs on your skin by washing with CHG (chlorahexidine gluconate) soap before surgery.  CHG is an antiseptic cleaner which kills germs and bonds with the skin to continue killing germs even after washing. ?Please DO NOT use if you have an allergy to CHG or antibacterial soaps.  If your skin becomes reddened/irritated stop using the CHG and inform your nurse when you arrive at Short Stay. ?Do not shave (including legs and underarms) for at least 48 hours prior to the first CHG shower.  You may shave your face/neck. ?Please follow these instructions carefully: ? 1.  Shower with CHG Soap the night before surgery and the  morning of Surgery. ? 2.  If you choose to wash your hair, wash your hair first as usual with your  normal  shampoo. ? 3.  After you shampoo, rinse your hair and body thoroughly to remove the  shampoo.                           4.  Use CHG as you would any other liquid soap.  You can apply chg directly  to the skin and wash  ?  Gently with a scrungie or clean washcloth. ? 5.  Apply the CHG Soap to your body ONLY FROM THE NECK DOWN.   Do not use on face/ open      ?                     Wound or open sores. Avoid contact with eyes, ears mouth and genitals (private parts).  ?                     Production manager,  Genitals (private parts) with your normal soap. ?            6.  Wash thoroughly, paying special attention to the area where your surgery  will be performed. ? 7.  Thoroughly rinse your body with warm water from the neck down. ? 8.  DO NOT shower/wash with your normal soap after using and rinsing off  the CHG Soap. ?               9.  Pat yourself dry with a clean towel. ?           10.  Wear clean pajamas. ?           11.  Place clean sheets on your bed the night of your first shower and do not  sleep with pets. ?Day of Surgery : ?Do not apply any lotions/deodorants the morning of surgery.  Please wear clean clothes to the  hospital/surgery center. ? ?FAILURE TO FOLLOW THESE INSTRUCTIONS MAY RESULT IN THE CANCELLATION OF YOUR SURGERY ?PATIENT SIGNATURE_________________________________ ? ?NURSE SIGNATURE__________________________________ ? ?________________________________________________________________________  ? ?Incentive Spirometer ? ?An incentive spirometer is a tool that can help keep your lungs clear and active. This tool measures how well you are filling your lungs with each breath. Taking long deep breaths may help reverse or decrease the chance of developing breathing (pulmonary) problems (especially infection) following: ?A long period of time when you are unable to move or be active. ?BEFORE THE PROCEDURE  ?If the spirometer includes an indicator to show your best effort, your nurse or respiratory therapist will set it to a desired goal. ?If possible, sit up straight or lean slightly forward. Try not to slouch. ?Hold the incentive spirometer in an upright position. ?INSTRUCTIONS FOR USE  ?Sit on the edge of your bed if possible, or sit up as far as you can in bed or on a chair. ?Hold the incentive spirometer in an upright position. ?Breathe out normally. ?Place the mouthpiece in your mouth and seal your lips tightly around it. ?Breathe in slowly and as deeply as possible, raising the piston or the ball toward the top of the column. ?Hold your breath for 3-5 seconds or for as long as possible. Allow the piston or ball to fall to the bottom of the column. ?Remove the mouthpiece from your mouth and breathe out normally. ?Rest for a few seconds and repeat Steps 1 through 7 at least 10 times every 1-2 hours when you are awake. Take your time and take a few normal breaths between deep breaths. ?The spirometer may include an indicator to show your best effort. Use the indicator as a goal to work toward during each repetition. ?After each set of 10 deep breaths, practice coughing to be sure your lungs are clear. If you have an  incision (the cut made at the time of surgery), support your incision when coughing by placing a pillow or rolled up towels  firmly against it. ?Once you are able to get out of bed, walk around indoors and cough well. Heather Lam

## 2021-10-19 ENCOUNTER — Other Ambulatory Visit: Payer: Self-pay | Admitting: Family Medicine

## 2021-10-19 DIAGNOSIS — Z720 Tobacco use: Secondary | ICD-10-CM

## 2021-10-26 ENCOUNTER — Ambulatory Visit (HOSPITAL_COMMUNITY): Payer: No Typology Code available for payment source | Admitting: Physician Assistant

## 2021-10-26 ENCOUNTER — Other Ambulatory Visit: Payer: Self-pay

## 2021-10-26 ENCOUNTER — Observation Stay (HOSPITAL_COMMUNITY)
Admission: RE | Admit: 2021-10-26 | Discharge: 2021-10-27 | Disposition: A | Discharge status: Home / Self Care | Payer: No Typology Code available for payment source | Source: Ambulatory Visit | Attending: Orthopedic Surgery | Admitting: Orthopedic Surgery

## 2021-10-26 ENCOUNTER — Encounter (HOSPITAL_COMMUNITY)
Admission: RE | Disposition: A | Discharge status: Home / Self Care | Payer: Self-pay | Source: Ambulatory Visit | Attending: Orthopedic Surgery

## 2021-10-26 ENCOUNTER — Encounter (HOSPITAL_COMMUNITY): Payer: Self-pay | Admitting: Orthopedic Surgery

## 2021-10-26 ENCOUNTER — Ambulatory Visit (HOSPITAL_COMMUNITY): Payer: No Typology Code available for payment source

## 2021-10-26 ENCOUNTER — Observation Stay (HOSPITAL_COMMUNITY): Payer: No Typology Code available for payment source

## 2021-10-26 ENCOUNTER — Ambulatory Visit (HOSPITAL_BASED_OUTPATIENT_CLINIC_OR_DEPARTMENT_OTHER): Payer: No Typology Code available for payment source | Admitting: Anesthesiology

## 2021-10-26 DIAGNOSIS — I10 Essential (primary) hypertension: Secondary | ICD-10-CM | POA: Insufficient documentation

## 2021-10-26 DIAGNOSIS — Z85828 Personal history of other malignant neoplasm of skin: Secondary | ICD-10-CM | POA: Insufficient documentation

## 2021-10-26 DIAGNOSIS — Z87891 Personal history of nicotine dependence: Secondary | ICD-10-CM | POA: Diagnosis not present

## 2021-10-26 DIAGNOSIS — Z79899 Other long term (current) drug therapy: Secondary | ICD-10-CM | POA: Insufficient documentation

## 2021-10-26 DIAGNOSIS — I2510 Atherosclerotic heart disease of native coronary artery without angina pectoris: Secondary | ICD-10-CM

## 2021-10-26 DIAGNOSIS — M1612 Unilateral primary osteoarthritis, left hip: Secondary | ICD-10-CM | POA: Diagnosis not present

## 2021-10-26 DIAGNOSIS — Z7982 Long term (current) use of aspirin: Secondary | ICD-10-CM | POA: Insufficient documentation

## 2021-10-26 DIAGNOSIS — Z9049 Acquired absence of other specified parts of digestive tract: Secondary | ICD-10-CM | POA: Insufficient documentation

## 2021-10-26 DIAGNOSIS — F418 Other specified anxiety disorders: Secondary | ICD-10-CM | POA: Diagnosis not present

## 2021-10-26 DIAGNOSIS — M169 Osteoarthritis of hip, unspecified: Secondary | ICD-10-CM | POA: Diagnosis present

## 2021-10-26 DIAGNOSIS — R7303 Prediabetes: Secondary | ICD-10-CM | POA: Insufficient documentation

## 2021-10-26 DIAGNOSIS — Z955 Presence of coronary angioplasty implant and graft: Secondary | ICD-10-CM | POA: Diagnosis not present

## 2021-10-26 DIAGNOSIS — Z01818 Encounter for other preprocedural examination: Secondary | ICD-10-CM

## 2021-10-26 HISTORY — PX: TOTAL HIP ARTHROPLASTY: SHX124

## 2021-10-26 LAB — TYPE AND SCREEN
ABO/RH(D): O POS
Antibody Screen: NEGATIVE

## 2021-10-26 SURGERY — ARTHROPLASTY, HIP, TOTAL, ANTERIOR APPROACH
Anesthesia: Monitor Anesthesia Care | Site: Hip | Laterality: Left

## 2021-10-26 MED ORDER — MORPHINE SULFATE (PF) 2 MG/ML IV SOLN
0.5000 mg | INTRAVENOUS | Status: DC | PRN
  Administered 2021-10-26: 1 mg via INTRAVENOUS
  Filled 2021-10-26: qty 1

## 2021-10-26 MED ORDER — LACTATED RINGERS IV SOLN: INTRAVENOUS | Status: DC

## 2021-10-26 MED ORDER — OXYCODONE HCL 5 MG PO TABS
10.0000 mg | ORAL_TABLET | ORAL | Status: DC | PRN
  Administered 2021-10-26 – 2021-10-27 (×3): 10 mg via ORAL
  Filled 2021-10-26 (×3): qty 2

## 2021-10-26 MED ORDER — FENTANYL CITRATE PF 50 MCG/ML IJ SOSY
25.0000 ug | PREFILLED_SYRINGE | INTRAMUSCULAR | Status: DC | PRN
  Administered 2021-10-26: 50 ug via INTRAVENOUS

## 2021-10-26 MED ORDER — DULOXETINE HCL 30 MG PO CPEP
30.0000 mg | ORAL_CAPSULE | Freq: Every day | ORAL | Status: DC
  Administered 2021-10-27: 30 mg via ORAL
  Filled 2021-10-26 (×2): qty 1

## 2021-10-26 MED ORDER — METHOCARBAMOL 500 MG IVPB - SIMPLE MED
INTRAVENOUS | Status: AC
  Filled 2021-10-26: qty 50

## 2021-10-26 MED ORDER — WATER FOR IRRIGATION, STERILE IR SOLN
Status: DC | PRN
  Administered 2021-10-26: 2000 mL

## 2021-10-26 MED ORDER — CEFAZOLIN SODIUM-DEXTROSE 2-4 GM/100ML-% IV SOLN
2.0000 g | INTRAVENOUS | Status: AC
  Administered 2021-10-26: 2 g via INTRAVENOUS
  Filled 2021-10-26: qty 100

## 2021-10-26 MED ORDER — METOCLOPRAMIDE HCL 5 MG/ML IJ SOLN: 5.0000 mg | Freq: Three times a day (TID) | INTRAMUSCULAR | Status: DC | PRN

## 2021-10-26 MED ORDER — PHENYLEPHRINE HCL-NACL 20-0.9 MG/250ML-% IV SOLN
INTRAVENOUS | Status: DC | PRN
  Administered 2021-10-26: 35 ug/min via INTRAVENOUS

## 2021-10-26 MED ORDER — PROPOFOL 1000 MG/100ML IV EMUL
INTRAVENOUS | Status: AC
  Filled 2021-10-26: qty 100

## 2021-10-26 MED ORDER — SCOPOLAMINE 1 MG/3DAYS TD PT72
1.0000 | MEDICATED_PATCH | Freq: Once | TRANSDERMAL | Status: DC
  Administered 2021-10-26: 1.5 mg via TRANSDERMAL
  Filled 2021-10-26: qty 1

## 2021-10-26 MED ORDER — MIDAZOLAM HCL 5 MG/5ML IJ SOLN
INTRAMUSCULAR | Status: DC | PRN
  Administered 2021-10-26: 2 mg via INTRAVENOUS

## 2021-10-26 MED ORDER — FENTANYL CITRATE (PF) 100 MCG/2ML IJ SOLN
INTRAMUSCULAR | Status: AC
  Filled 2021-10-26: qty 2

## 2021-10-26 MED ORDER — ROSUVASTATIN CALCIUM 20 MG PO TABS
20.0000 mg | ORAL_TABLET | ORAL | Status: DC
  Administered 2021-10-27: 20 mg via ORAL
  Filled 2021-10-26: qty 1

## 2021-10-26 MED ORDER — METHOCARBAMOL 500 MG IVPB - SIMPLE MED
500.0000 mg | Freq: Four times a day (QID) | INTRAVENOUS | Status: DC | PRN
  Administered 2021-10-26: 500 mg via INTRAVENOUS
  Filled 2021-10-26: qty 50

## 2021-10-26 MED ORDER — RIVAROXABAN 10 MG PO TABS
10.0000 mg | ORAL_TABLET | Freq: Every day | ORAL | Status: DC
  Administered 2021-10-27: 10 mg via ORAL
  Filled 2021-10-26: qty 1

## 2021-10-26 MED ORDER — EPHEDRINE 5 MG/ML INJ
INTRAVENOUS | Status: AC
  Filled 2021-10-26: qty 5

## 2021-10-26 MED ORDER — METOCLOPRAMIDE HCL 5 MG PO TABS: 5.0000 mg | ORAL_TABLET | Freq: Three times a day (TID) | ORAL | Status: DC | PRN

## 2021-10-26 MED ORDER — ACETAMINOPHEN 10 MG/ML IV SOLN
1000.0000 mg | Freq: Four times a day (QID) | INTRAVENOUS | Status: DC
  Administered 2021-10-26: 1000 mg via INTRAVENOUS
  Filled 2021-10-26: qty 100

## 2021-10-26 MED ORDER — BUPIVACAINE IN DEXTROSE 0.75-8.25 % IT SOLN
INTRATHECAL | Status: DC | PRN
  Administered 2021-10-26: 1.6 mL via INTRATHECAL

## 2021-10-26 MED ORDER — ONDANSETRON HCL 4 MG/2ML IJ SOLN
INTRAMUSCULAR | Status: AC
  Filled 2021-10-26: qty 2

## 2021-10-26 MED ORDER — MIDAZOLAM HCL 2 MG/2ML IJ SOLN
INTRAMUSCULAR | Status: AC
  Filled 2021-10-26: qty 2

## 2021-10-26 MED ORDER — DOCUSATE SODIUM 100 MG PO CAPS
100.0000 mg | ORAL_CAPSULE | Freq: Two times a day (BID) | ORAL | Status: DC
  Administered 2021-10-26 – 2021-10-27 (×2): 100 mg via ORAL
  Filled 2021-10-26 (×2): qty 1

## 2021-10-26 MED ORDER — POLYETHYLENE GLYCOL 3350 17 G PO PACK: 17.0000 g | PACK | Freq: Every day | ORAL | Status: DC | PRN

## 2021-10-26 MED ORDER — FENTANYL CITRATE PF 50 MCG/ML IJ SOSY
PREFILLED_SYRINGE | INTRAMUSCULAR | Status: AC
  Filled 2021-10-26: qty 1

## 2021-10-26 MED ORDER — POVIDONE-IODINE 10 % EX SWAB
2.0000 "application " | Freq: Once | CUTANEOUS | Status: AC
  Administered 2021-10-26: 2 via TOPICAL

## 2021-10-26 MED ORDER — BISACODYL 10 MG RE SUPP: 10.0000 mg | Freq: Every day | RECTAL | Status: DC | PRN

## 2021-10-26 MED ORDER — LOSARTAN POTASSIUM 50 MG PO TABS
100.0000 mg | ORAL_TABLET | Freq: Every day | ORAL | Status: DC
  Administered 2021-10-27: 100 mg via ORAL
  Filled 2021-10-26: qty 2

## 2021-10-26 MED ORDER — DEXAMETHASONE SODIUM PHOSPHATE 10 MG/ML IJ SOLN
10.0000 mg | Freq: Once | INTRAMUSCULAR | Status: AC
  Administered 2021-10-27: 10 mg via INTRAVENOUS
  Filled 2021-10-26: qty 1

## 2021-10-26 MED ORDER — DIPHENHYDRAMINE HCL 12.5 MG/5ML PO ELIX
12.5000 mg | ORAL_SOLUTION | ORAL | Status: DC | PRN
  Administered 2021-10-26: 25 mg via ORAL
  Filled 2021-10-26: qty 10

## 2021-10-26 MED ORDER — ONDANSETRON HCL 4 MG PO TABS: 4.0000 mg | ORAL_TABLET | Freq: Four times a day (QID) | ORAL | Status: DC | PRN

## 2021-10-26 MED ORDER — ONDANSETRON HCL 4 MG/2ML IJ SOLN
4.0000 mg | Freq: Four times a day (QID) | INTRAMUSCULAR | Status: DC | PRN
Start: 2021-10-26 — End: 2021-10-27

## 2021-10-26 MED ORDER — 0.9 % SODIUM CHLORIDE (POUR BTL) OPTIME
TOPICAL | Status: DC | PRN
  Administered 2021-10-26 (×2): 1000 mL

## 2021-10-26 MED ORDER — ACETAMINOPHEN 500 MG PO TABS
1000.0000 mg | ORAL_TABLET | Freq: Four times a day (QID) | ORAL | Status: AC
  Administered 2021-10-26 – 2021-10-27 (×4): 1000 mg via ORAL
  Filled 2021-10-26 (×4): qty 2

## 2021-10-26 MED ORDER — HYDROCHLOROTHIAZIDE 25 MG PO TABS
25.0000 mg | ORAL_TABLET | Freq: Every day | ORAL | Status: DC
  Administered 2021-10-27: 25 mg via ORAL
  Filled 2021-10-26: qty 1

## 2021-10-26 MED ORDER — BUPIVACAINE-EPINEPHRINE (PF) 0.25% -1:200000 IJ SOLN
INTRAMUSCULAR | Status: DC | PRN
  Administered 2021-10-26: 30 mL

## 2021-10-26 MED ORDER — DEXAMETHASONE SODIUM PHOSPHATE 10 MG/ML IJ SOLN
INTRAMUSCULAR | Status: AC
Start: 2021-10-26 — End: ?
  Filled 2021-10-26: qty 1

## 2021-10-26 MED ORDER — CHLORHEXIDINE GLUCONATE 0.12 % MT SOLN
15.0000 mL | Freq: Once | OROMUCOSAL | Status: AC
  Administered 2021-10-26: 15 mL via OROMUCOSAL

## 2021-10-26 MED ORDER — ORAL CARE MOUTH RINSE: 15.0000 mL | Freq: Once | OROMUCOSAL | Status: AC

## 2021-10-26 MED ORDER — MENTHOL 3 MG MT LOZG: 1.0000 | LOZENGE | OROMUCOSAL | Status: DC | PRN

## 2021-10-26 MED ORDER — LOSARTAN POTASSIUM-HCTZ 100-25 MG PO TABS: 1.0000 | ORAL_TABLET | Freq: Every day | ORAL | Status: DC

## 2021-10-26 MED ORDER — METHOCARBAMOL 500 MG PO TABS
500.0000 mg | ORAL_TABLET | Freq: Four times a day (QID) | ORAL | Status: DC | PRN
  Administered 2021-10-26 – 2021-10-27 (×3): 500 mg via ORAL
  Filled 2021-10-26 (×3): qty 1

## 2021-10-26 MED ORDER — PROPOFOL 10 MG/ML IV BOLUS
INTRAVENOUS | Status: DC | PRN
  Administered 2021-10-26: 20 mg via INTRAVENOUS

## 2021-10-26 MED ORDER — NITROGLYCERIN 0.4 MG SL SUBL: 0.4000 mg | SUBLINGUAL_TABLET | SUBLINGUAL | Status: DC | PRN

## 2021-10-26 MED ORDER — TRANEXAMIC ACID-NACL 1000-0.7 MG/100ML-% IV SOLN
1000.0000 mg | INTRAVENOUS | Status: AC
  Administered 2021-10-26: 1000 mg via INTRAVENOUS
  Filled 2021-10-26: qty 100

## 2021-10-26 MED ORDER — BUPIVACAINE-EPINEPHRINE (PF) 0.25% -1:200000 IJ SOLN
INTRAMUSCULAR | Status: AC
  Filled 2021-10-26: qty 30

## 2021-10-26 MED ORDER — PHENOL 1.4 % MT LIQD: 1.0000 | OROMUCOSAL | Status: DC | PRN

## 2021-10-26 MED ORDER — SODIUM CHLORIDE 0.9 % IV SOLN: INTRAVENOUS | Status: DC

## 2021-10-26 MED ORDER — METOPROLOL TARTRATE 25 MG PO TABS
25.0000 mg | ORAL_TABLET | Freq: Two times a day (BID) | ORAL | Status: DC
  Administered 2021-10-26 – 2021-10-27 (×2): 25 mg via ORAL
  Filled 2021-10-26 (×2): qty 1

## 2021-10-26 MED ORDER — PROPOFOL 500 MG/50ML IV EMUL
INTRAVENOUS | Status: DC | PRN
  Administered 2021-10-26: 75 ug/kg/min via INTRAVENOUS

## 2021-10-26 MED ORDER — CEFAZOLIN SODIUM-DEXTROSE 2-4 GM/100ML-% IV SOLN
2.0000 g | Freq: Four times a day (QID) | INTRAVENOUS | Status: AC
  Administered 2021-10-26 (×2): 2 g via INTRAVENOUS
  Filled 2021-10-26 (×2): qty 100

## 2021-10-26 MED ORDER — FENTANYL CITRATE (PF) 100 MCG/2ML IJ SOLN
INTRAMUSCULAR | Status: DC | PRN
  Administered 2021-10-26 (×2): 50 ug via INTRAVENOUS

## 2021-10-26 MED ORDER — OXYCODONE HCL 5 MG PO TABS
5.0000 mg | ORAL_TABLET | ORAL | Status: DC | PRN
  Administered 2021-10-27 (×2): 5 mg via ORAL
  Filled 2021-10-26 (×2): qty 1

## 2021-10-26 MED ORDER — DEXAMETHASONE SODIUM PHOSPHATE 10 MG/ML IJ SOLN
8.0000 mg | Freq: Once | INTRAMUSCULAR | Status: AC
  Administered 2021-10-26: 8 mg via INTRAVENOUS

## 2021-10-26 MED ORDER — ONDANSETRON HCL 4 MG/2ML IJ SOLN
INTRAMUSCULAR | Status: DC | PRN
  Administered 2021-10-26: 4 mg via INTRAVENOUS

## 2021-10-26 MED ORDER — FLEET ENEMA 7-19 GM/118ML RE ENEM: 1.0000 | ENEMA | Freq: Once | RECTAL | Status: DC | PRN

## 2021-10-26 MED ORDER — AMISULPRIDE (ANTIEMETIC) 5 MG/2ML IV SOLN: 10.0000 mg | Freq: Once | INTRAVENOUS | Status: DC | PRN

## 2021-10-26 MED ORDER — EPHEDRINE SULFATE-NACL 50-0.9 MG/10ML-% IV SOSY
PREFILLED_SYRINGE | INTRAVENOUS | Status: DC | PRN
  Administered 2021-10-26 (×2): 10 mg via INTRAVENOUS

## 2021-10-26 SURGICAL SUPPLY — 43 items
BAG COUNTER SPONGE SURGICOUNT (BAG) ×1 IMPLANT
BAG DECANTER FOR FLEXI CONT (MISCELLANEOUS) ×1 IMPLANT
BAG SPEC THK2 15X12 ZIP CLS (MISCELLANEOUS) ×1
BAG SPNG CNTER NS LX DISP (BAG) ×1
BAG ZIPLOCK 12X15 (MISCELLANEOUS) ×1 IMPLANT
BLADE SAG 18X100X1.27 (BLADE) ×2 IMPLANT
COVER PERINEAL POST (MISCELLANEOUS) ×2 IMPLANT
COVER SURGICAL LIGHT HANDLE (MISCELLANEOUS) ×2 IMPLANT
CUP ACETBLR 48 OD SECTOR II (Hips) ×1 IMPLANT
DRAPE FOOT SWITCH (DRAPES) ×2 IMPLANT
DRAPE STERI IOBAN 125X83 (DRAPES) ×2 IMPLANT
DRAPE U-SHAPE 47X51 STRL (DRAPES) ×4 IMPLANT
DRSG AQUACEL AG ADV 3.5X10 (GAUZE/BANDAGES/DRESSINGS) ×2 IMPLANT
DURAPREP 26ML APPLICATOR (WOUND CARE) ×2 IMPLANT
ELECT REM PT RETURN 15FT ADLT (MISCELLANEOUS) ×2 IMPLANT
GLOVE SRG 8 PF TXTR STRL LF DI (GLOVE) ×1 IMPLANT
GLOVE SURG ENC MOIS LTX SZ6.5 (GLOVE) ×2 IMPLANT
GLOVE SURG ENC MOIS LTX SZ7 (GLOVE) ×2 IMPLANT
GLOVE SURG ENC MOIS LTX SZ8 (GLOVE) ×4 IMPLANT
GLOVE SURG UNDER POLY LF SZ7 (GLOVE) ×2 IMPLANT
GLOVE SURG UNDER POLY LF SZ8 (GLOVE) ×2
GLOVE SURG UNDER POLY LF SZ8.5 (GLOVE) ×1 IMPLANT
GOWN STRL REUS W/ TWL LRG LVL3 (GOWN DISPOSABLE) ×2 IMPLANT
GOWN STRL REUS W/TWL LRG LVL3 (GOWN DISPOSABLE) ×4
HEAD CERAMIC DELTA 28 P1.5 HIP (Head) ×1 IMPLANT
HOLDER FOLEY CATH W/STRAP (MISCELLANEOUS) ×2 IMPLANT
KIT TURNOVER KIT A (KITS) ×1 IMPLANT
LINER MARATHON 28 48 (Hips) ×1 IMPLANT
MANIFOLD NEPTUNE II (INSTRUMENTS) ×2 IMPLANT
PACK ANTERIOR HIP CUSTOM (KITS) ×2 IMPLANT
PENCIL SMOKE EVACUATOR COATED (MISCELLANEOUS) ×2 IMPLANT
SPIKE FLUID TRANSFER (MISCELLANEOUS) ×2 IMPLANT
SPONGE T-LAP 18X18 ~~LOC~~+RFID (SPONGE) ×4 IMPLANT
STEM FEM ACTIS STD SZ1 (Stem) ×1 IMPLANT
STRIP CLOSURE SKIN 1/2X4 (GAUZE/BANDAGES/DRESSINGS) ×2 IMPLANT
SUT ETHIBOND NAB CT1 #1 30IN (SUTURE) ×2 IMPLANT
SUT MNCRL AB 4-0 PS2 18 (SUTURE) ×2 IMPLANT
SUT STRATAFIX 0 PDS 27 VIOLET (SUTURE) ×2
SUT VIC AB 2-0 CT1 27 (SUTURE) ×6
SUT VIC AB 2-0 CT1 TAPERPNT 27 (SUTURE) ×2 IMPLANT
SUTURE STRATFX 0 PDS 27 VIOLET (SUTURE) ×1 IMPLANT
TRAY FOLEY MTR SLVR 16FR STAT (SET/KITS/TRAYS/PACK) ×2 IMPLANT
TUBE SUCTION HIGH CAP CLEAR NV (SUCTIONS) ×2 IMPLANT

## 2021-10-26 NOTE — Anesthesia Postprocedure Evaluation (Signed)
Anesthesia Post Note ? ?Patient: Heather Lam ? ?Procedure(s) Performed: TOTAL HIP ARTHROPLASTY ANTERIOR APPROACH (Left: Hip) ? ?  ? ?Patient location during evaluation: PACU ?Anesthesia Type: MAC ?Level of consciousness: awake and alert ?Pain management: pain level controlled ?Vital Signs Assessment: post-procedure vital signs reviewed and stable ?Respiratory status: spontaneous breathing and respiratory function stable ?Cardiovascular status: blood pressure returned to baseline and stable ?Postop Assessment: spinal receding ?Anesthetic complications: no ? ? ?No notable events documented. ? ?Last Vitals:  ?Vitals:  ? 10/26/21 1500 10/26/21 1515  ?BP: (!) 130/57 (!) 129/57  ?Pulse: 76 80  ?Resp: 16 15  ?Temp:    ?SpO2: 92% 95%  ?  ?Last Pain:  ?Vitals:  ? 10/26/21 1515  ?TempSrc:   ?PainSc: 0-No pain  ? ? ?LLE Motor Response: Purposeful movement (10/26/21 1515) ?LLE Sensation: Decreased;Numbness (10/26/21 1515) ?RLE Motor Response: Purposeful movement (10/26/21 1515) ?RLE Sensation: Decreased;Numbness (10/26/21 1515) ?L Sensory Level: L3-Anterior knee, lower leg (10/26/21 1515) ?R Sensory Level: L2-Upper inner thigh, upper buttock (10/26/21 1515) ? ?Heather Lam ? ? ? ? ?

## 2021-10-26 NOTE — Anesthesia Procedure Notes (Signed)
Spinal ? ?Patient location during procedure: OR ?End time: 10/26/2021 12:25 PM ?Reason for block: surgical anesthesia ?Staffing ?Performed: resident/CRNA  ?Anesthesiologist: Duane Boston, MD ?Resident/CRNA: Maxwell Caul, CRNA ?Preanesthetic Checklist ?Completed: patient identified, IV checked, site marked, risks and benefits discussed, surgical consent, monitors and equipment checked, pre-op evaluation and timeout performed ?Spinal Block ?Patient position: sitting ?Prep: DuraPrep ?Patient monitoring: heart rate, cardiac monitor, continuous pulse ox and blood pressure ?Approach: midline ?Location: L3-4 ?Injection technique: single-shot ?Needle ?Needle type: Pencan  ?Needle gauge: 24 G ?Needle length: 10 cm ?Assessment ?Sensory level: T4 ?Events: CSF return ?Additional Notes ?IV functioning, monitors applied to pt. Expiration date of kit checked and confirmed to be in date. Sterile prep and drape, hand hygiene and sterile gloves used. Pt was positioned and spine was prepped in sterile fashion. Skin was anesthetized with lidocaine. Free flow of clear CSF obtained prior to injecting local anesthetic into CSF x 1 attempt. Spinal needle aspirated freely following injection. Needle was carefully withdrawn, and pt tolerated procedure well. Loss of motor and sensory on exam post injection.  ? ? ? ?

## 2021-10-26 NOTE — Op Note (Signed)
?    OPERATIVE REPORT- TOTAL HIP ARTHROPLASTY ? ? ?PREOPERATIVE DIAGNOSIS: Osteoarthritis of the Left hip.  ? ?POSTOPERATIVE DIAGNOSIS: Osteoarthritis of the Left  hip.  ? ?PROCEDURE: Left total hip arthroplasty, anterior approach.  ? ?SURGEON: Gaynelle Arabian, MD  ? ?ASSISTANT: Molli Barrows, PA-C ? ?ANESTHESIA:  Spinal ? ?ESTIMATED BLOOD LOSS:-200 mL   ? ?DRAINS: Hemovac x1.  ? ?COMPLICATIONS: None ?  ?CONDITION: PACU - hemodynamically stable.  ? ?BRIEF CLINICAL NOTE: Heather Lam is a 61 y.o. female who has advanced end-  ?stage arthritis of their Left  hip with progressively worsening pain and  ?dysfunction.The patient has failed nonoperative management and presents for  ?total hip arthroplasty.  ? ?PROCEDURE IN DETAIL: After successful administration of spinal  ?anesthetic, the traction boots for the [facility] bed were placed on both  ?feet and the patient was placed onto the [facility] bed, boots placed into the leg  ?holders. The Left hip was then isolated from the perineum with plastic  ?drapes and prepped and draped in the usual sterile fashion. ASIS and  ?greater trochanter were marked and a oblique incision was made, starting  ?at about 1 cm lateral and 2 cm distal to the ASIS and coursing towards  ?the anterior cortex of the femur. The skin was cut with a 10 blade  ?through subcutaneous tissue to the level of the fascia overlying the  ?tensor fascia lata muscle. The fascia was then incised in line with the  ?incision at the junction of the anterior third and posterior 2/3rd. The  ?muscle was teased off the fascia and then the interval between the TFL  ?and the rectus was developed. The Hohmann retractor was then placed at  ?the top of the femoral neck over the capsule. The vessels overlying the  ?capsule were cauterized and the fat on top of the capsule was removed.  ?A Hohmann retractor was then placed anterior underneath the rectus  ?femoris to give exposure to the entire anterior capsule. A T-shaped   ?capsulotomy was performed. The edges were tagged and the femoral head  ?was identified. ?      Osteophytes are removed off the superior acetabulum.  ?The femoral neck was then cut in situ with an oscillating saw. Traction  ?was then applied to the left lower extremity utilizing the [facility]  ?traction. The femoral head was then removed. Retractors were placed  ?around the acetabulum and then circumferential removal of the labrum was  ?performed. Osteophytes were also removed. Reaming starts at 45 mm to  ?medialize and  Increased in 2 mm increments to 47 mm. We reamed in  ?approximately 40 degrees of abduction, 20 degrees anteversion. A 48 mm  ?pinnacle acetabular shell was then impacted in anatomic position under  ?fluoroscopic guidance with excellent purchase. We did not need to place  ?any additional dome screws. A 28 mm neutral + 4 marathon liner was then  ?placed into the acetabular shell.  ?     The femoral lift was then placed along the lateral aspect of the femur  ?just distal to the vastus ridge. The leg was  externally rotated and capsule  ?was stripped off the inferior aspect of the femoral neck down to the  ?level of the lesser trochanter, this was done with electrocautery. The femur was lifted after this was performed. The  leg was then placed in an extended and adducted position essentially delivering the femur. We also removed the capsule superiorly and the piriformis from the piriformis  fossa to gain excellent exposure of the  ?proximal femur. Rongeur was used to remove some cancellous bone to get  ?into the lateral portion of the proximal femur for placement of the  ?initial starter reamer. The starter broaches was placed  the starter broach  and was shown to go down the center of the canal. Broaching  ?with the Actis system was then performed starting at size 0  coursing  ?Up to size 1. A size 1 had excellent torsional and rotational  ?and axial stability. The trial standard offset neck was then  placed  ?with a 28 + 1.5 trial head. The hip was then reduced. We confirmed that  ?the stem was in the canal both on AP and lateral x-rays. It also has excellent sizing. The hip was reduced with outstanding stability through full extension and full external rotation.. AP pelvis was taken and the leg lengths were measured and found to be equal. Hip was then dislocated again and the femoral head and neck removed. The  ?femoral broach was removed. Size 1 Actis stem with a standard offset  ?neck was then impacted into the femur following native anteversion. Has  ?excellent purchase in the canal. Excellent torsional and rotational and  ?axial stability. It is confirmed to be in the canal on AP and lateral  ?fluoroscopic views. The 28 + 1.5 ceramic head was placed and the hip  ?reduced with outstanding stability. Again AP pelvis was taken and it  ?confirmed that the leg lengths were equal. The wound was then copiously  ?irrigated with saline solution and the capsule reattached and repaired  ?with Ethibond suture. 30 ml of .25% Bupivicaine was  injected into the capsule and into the edge of the tensor fascia lata as well as subcutaneous tissue. The fascia overlying the tensor fascia lata was then closed with a running #1 V-Loc. Subcu was closed with interrupted 2-0 Vicryl and subcuticular running 4-0 Monocryl. Incision was cleaned  ?and dried. Steri-Strips and a bulky sterile dressing applied. The patient was awakened and transported to  ?recovery in stable condition.  ?      Please note that a surgical assistant was a medical necessity for this procedure to perform it in a safe and expeditious manner. Assistant was necessary to provide appropriate retraction of vital neurovascular structures and to prevent femoral fracture and allow for anatomic placement of the prosthesis. ? ?Gaynelle Arabian, M.D.  ? ? ?

## 2021-10-26 NOTE — Progress Notes (Signed)
Orthopedic Tech Progress Note ?Patient Details:  ?Heather Lam ?[birth year] ?[id number] ? ?Ortho Devices ?Ortho Device/Splint Location: trapeze bar ?Ortho Device/Splint Interventions: Application ?  ?Post Interventions ?Patient Tolerated: Well ?Instructions Provided: Care of device, Adjustment of device ? ?[place] Pink ?10/26/2021, 3:55 PM ? ?

## 2021-10-26 NOTE — Interval H&P Note (Signed)
History and Physical Interval Note: ? ?10/26/2021 ?10:22 AM ? ?[name]  has presented today for surgery, with the diagnosis of left hip osteoarthritis.  The various methods of treatment have been discussed with the patient and family. After consideration of risks, benefits and other options for treatment, the patient has consented to  Procedure(s): ?TOTAL HIP ARTHROPLASTY ANTERIOR APPROACH (Left) as a surgical intervention.  The patient's history has been reviewed, patient examined, no change in status, stable for surgery.  I have reviewed the patient's chart and labs.  Questions were answered to the patient's satisfaction.   ? ? ?Pilar Plate Wiletta Bermingham ? ? ?

## 2021-10-26 NOTE — Discharge Instructions (Addendum)
?Heather Arabian, MD ?Total Joint Specialist ?[facility] ?[address] ?[place], [place] [address] ?[phone] ? ?ANTERIOR APPROACH TOTAL HIP REPLACEMENT POSTOPERATIVE DIRECTIONS ? ? ? ? ?Hip Rehabilitation, Guidelines Following Surgery  ?The results of a hip operation are greatly improved after range of motion and muscle strengthening exercises. Follow all safety measures which are given to protect your hip. If any of these exercises cause increased pain or swelling in your joint, decrease the amount until you are comfortable again. Then slowly increase the exercises. Call your caregiver if you have problems or questions.  ? ?BLOOD CLOT PREVENTION ?Take a 10 mg Xarelto once a day for three weeks following surgery. Then resume one 325 mg Aspirin once a day. ?You may resume your vitamins/supplements once you have discontinued the Xarelto. ?Do not take any NSAIDs (Advil, Aleve, Ibuprofen, Meloxicam, etc.) until you have discontinued the Xarelto.  ? ?HOME CARE INSTRUCTIONS  ?Remove items at home which could result in a fall. This includes throw rugs or furniture in walking pathways.  ?ICE to the affected hip as frequently as 20-30 minutes an hour and then as needed for pain and swelling. Continue to use ice on the hip for pain and swelling from surgery. You may notice swelling that will progress down to the foot and ankle. This is normal after surgery. Elevate the leg when you are not up walking on it.   ?Continue to use the breathing machine which will help keep your temperature down.  It is common for your temperature to cycle up and down following surgery, especially at night when you are not up moving around and exerting yourself.  The breathing machine keeps your lungs expanded and your temperature down. ? ?DIET ?You may resume your previous home diet once your are discharged from the hospital. ? ?DRESSING / WOUND CARE / SHOWERING ?You have an adhesive waterproof bandage over the  incision. Leave this in place until your first follow-up appointment. Once you remove this you will not need to place another bandage.  ?You may begin showering 3 days following surgery, but do not submerge the incision under water. ? ?ACTIVITY ?For the first 3-5 days, it is important to rest and keep the operative leg elevated. You should, as a general rule, rest for 50 minutes and walk/stretch for 10 minutes per hour. After 5 days, you may slowly increase activity as tolerated.  ?Perform the exercises you were provided twice a day for about 15-20 minutes each session. Begin these 2 days following surgery. ?Walk with your walker as instructed. Use the walker until you are comfortable transitioning to a cane. Walk with the cane in the opposite hand of the operative leg. You may discontinue the cane once you are comfortable and walking steadily. ?Avoid periods of inactivity such as sitting longer than an hour when not asleep. This helps prevent blood clots.  ?Do not drive a car for 6 weeks or until released by your surgeon.  ?Do not drive while taking narcotics. ? ?TED HOSE STOCKINGS ?Wear the elastic stockings on both legs for three weeks following surgery during the day. You may remove them at night while sleeping. ? ?WEIGHT BEARING ?Weight bearing as tolerated with assist device (walker, cane, etc) as directed, use it as long as suggested by your surgeon or therapist, typically at least 4-6 weeks. ? ?POSTOPERATIVE CONSTIPATION PROTOCOL ?Constipation - defined medically as fewer than three stools per week and severe constipation as less than one stool per week. ? ?  One of the most common issues patients have following surgery is constipation.  Even if you have a regular bowel pattern at home, your normal regimen is likely to be disrupted due to multiple reasons following surgery.  Combination of anesthesia, postoperative narcotics, change in appetite and fluid intake all can affect your bowels.  In order to avoid  complications following surgery, here are some recommendations in order to help you during your recovery period. ? ?Colace (docusate) - Pick up an over-the-counter form of Colace or another stool softener and take twice a day as long as you are requiring postoperative pain medications.  Take with a full glass of water daily.  If you experience loose stools or diarrhea, hold the colace until you stool forms back up.  If your symptoms do not get better within 1 week or if they get worse, check with your doctor. ?Dulcolax (bisacodyl) - Pick up over-the-counter and take as directed by the product packaging as needed to assist with the movement of your bowels.  Take with a full glass of water.  Use this product as needed if not relieved by Colace only.  ?MiraLax (polyethylene glycol) - Pick up over-the-counter to have on hand.  MiraLax is a solution that will increase the amount of water in your bowels to assist with bowel movements.  Take as directed and can mix with a glass of water, juice, soda, coffee, or tea.  Take if you go more than two days without a movement.Do not use MiraLax more than once per day. Call your doctor if you are still constipated or irregular after using this medication for 7 days in a row. ? ?If you continue to have problems with postoperative constipation, please contact the office for further assistance and recommendations.  If you experience "the worst abdominal pain ever" or develop nausea or vomiting, please contact the office immediatly for further recommendations for treatment. ? ?ITCHING ? If you experience itching with your medications, try taking only a single pain pill, or even half a pain pill at a time.  You can also use Benadryl over the counter for itching or also to help with sleep.  ? ?MEDICATIONS ?See your medication summary on the ?After Visit Summary? that the nursing staff will review with you prior to discharge.  You may have some home medications which will be placed on  hold until you complete the course of blood thinner medication.  It is important for you to complete the blood thinner medication as prescribed by your surgeon.  Continue your approved medications as instructed at time of discharge. ? ?PRECAUTIONS ?If you experience chest pain or shortness of breath - call 911 immediately for transfer to the hospital emergency department.  ?If you develop a fever greater that 101 F, purulent drainage from wound, increased redness or drainage from wound, foul odor from the wound/dressing, or calf pain - CONTACT YOUR SURGEON.   ?                                                ?FOLLOW-UP APPOINTMENTS ?Make sure you keep all of your appointments after your operation with your surgeon and caregivers. You should call the office at the above phone number and make an appointment for approximately two weeks after the date of your surgery or on the date instructed by your surgeon outlined in  the "After Visit Summary". ? ?RANGE OF MOTION AND STRENGTHENING EXERCISES  ?These exercises are designed to help you keep full movement of your hip joint. Follow your caregiver's or physical therapist's instructions. Perform all exercises about fifteen times, three times per day or as directed. Exercise both hips, even if you have had only one joint replacement. These exercises can be done on a training (exercise) mat, on the floor, on a table or on a bed. Use whatever works the best and is most comfortable for you. Use music or television while you are exercising so that the exercises are a pleasant break in your day. This will make your life better with the exercises acting as a break in routine you can look forward to.  ?Lying on your back, slowly slide your foot toward your buttocks, raising your knee up off the floor. Then slowly slide your foot back down until your leg is straight again.  ?Lying on your back spread your legs as far apart as you can without causing discomfort.  ?Lying on your side,  raise your upper leg and foot straight up from the floor as far as is comfortable. Slowly lower the leg and repeat.  ?Lying on your back, tighten up the muscle in the front of your thigh (quadriceps muscles).

## 2021-10-26 NOTE — Anesthesia Procedure Notes (Signed)
Procedure Name: [place] ?Date/Time: 10/26/2021 12:18 PM ?Performed by: Maxwell Caul, CRNA ?Pre-anesthesia Checklist: Patient identified, Emergency Drugs available, Suction available and Patient being monitored ?Oxygen Delivery Method: Simple face mask ? ? ? ? ?

## 2021-10-26 NOTE — Transfer of Care (Signed)
Immediate Anesthesia Transfer of Care Note ? ?Patient: Heather Lam ? ?Procedure(s) Performed: TOTAL HIP ARTHROPLASTY ANTERIOR APPROACH (Left: Hip) ? ?Patient Location: PACU ? ?Anesthesia Type:Spinal ? ?Level of Consciousness: awake ? ?Airway & Oxygen Therapy: Patient Spontanous Breathing and Patient connected to face mask oxygen ? ?Post-op Assessment: Report given to RN and Post -op Vital signs reviewed and stable ? ?Post vital signs: Reviewed and stable ? ?Last Vitals:  ?Vitals Value Taken Time  ?BP 120/58 10/26/21 1452  ?Temp    ?Pulse 74 10/26/21 1454  ?Resp 15 10/26/21 1454  ?SpO2 99 % 10/26/21 1454  ?Vitals shown include unvalidated device data. ? ?Last Pain:  ?Vitals:  ? 10/26/21 1029  ?TempSrc: Oral  ?PainSc: 5   ?   ? ?Patients Stated Pain Goal: 4 (10/26/21 1029) ? ?Complications: No notable events documented. ?

## 2021-10-27 ENCOUNTER — Encounter (HOSPITAL_COMMUNITY): Payer: Self-pay | Admitting: Orthopedic Surgery

## 2021-10-27 ENCOUNTER — Other Ambulatory Visit (HOSPITAL_COMMUNITY): Payer: Self-pay

## 2021-10-27 DIAGNOSIS — M1612 Unilateral primary osteoarthritis, left hip: Secondary | ICD-10-CM | POA: Diagnosis not present

## 2021-10-27 LAB — BASIC METABOLIC PANEL
Anion gap: 6 (ref 5–15)
BUN: 17 mg/dL (ref 6–20)
CO2: 28 mmol/L (ref 22–32)
Calcium: 8.5 mg/dL — ABNORMAL LOW (ref 8.9–10.3)
Chloride: 103 mmol/L (ref 98–111)
Creatinine, Ser: 0.65 mg/dL (ref 0.44–1.00)
GFR, Estimated: >60 mL/min (ref >60)
Glucose, Bld: 150 mg/dL — ABNORMAL HIGH (ref 70–99)
Potassium: 3.8 mmol/L (ref 3.5–5.1)
Sodium: 137 mmol/L (ref 135–145)

## 2021-10-27 LAB — CBC
HCT: 36 % (ref 36.0–46.0)
Hemoglobin: 12.4 g/dL (ref 12.0–15.0)
MCH: 30 pg (ref 26.0–34.0)
MCHC: 34.4 g/dL (ref 30.0–36.0)
MCV: 87 fL (ref 80.0–100.0)
Platelets: 210 10*3/uL (ref 150–400)
RBC: 4.14 MIL/uL (ref 3.87–5.11)
RDW: 13.2 % (ref 11.5–15.5)
WBC: 19.7 10*3/uL — ABNORMAL HIGH (ref 4.0–10.5)
nRBC: 0 % (ref 0.0–0.2)

## 2021-10-27 MED ORDER — RIVAROXABAN 10 MG PO TABS
10.0000 mg | ORAL_TABLET | Freq: Every day | ORAL | 0 refills | Status: DC
  Filled 2021-10-27: qty 20, 20d supply, fill #0

## 2021-10-27 MED ORDER — METHOCARBAMOL 500 MG PO TABS
500.0000 mg | ORAL_TABLET | Freq: Four times a day (QID) | ORAL | 0 refills | Status: DC | PRN
Start: 2021-10-27 — End: 2022-02-19
  Filled 2021-10-27: qty 40, 10d supply, fill #0

## 2021-10-27 MED ORDER — OXYCODONE HCL 5 MG PO TABS
5.0000 mg | ORAL_TABLET | Freq: Four times a day (QID) | ORAL | 0 refills | Status: DC | PRN
  Filled 2021-10-27: qty 42, 6d supply, fill #0

## 2021-10-27 NOTE — Progress Notes (Signed)
Discharge instructions discussed with patient and family, verbalized agreement and understanding 

## 2021-10-27 NOTE — Progress Notes (Signed)
Physical Therapy Treatment ?Patient Details ?Name: Heather Lam ?MRN: [id number] ?DOB: 07/28/1961 ?Today's Date: 10/27/2021 ? ? ?History of Present Illness 61 yo female s/p L THA-DA 3/27. Hx of MI, back sg ? ?  ?PT Comments  ? ? Pt agreeable to 2nd session. Increased pain this afternoon 2* patient had not moved since last session nor had any pain meds. She walked ~75 feet but had some difficulty advancing L LE due to pain. Reviewed HEP. Encouraged pt to ambulate often at home and to run through HEP 1-2x/day as tolerated. All education completed. Made RN aware of need for pain meds and encouraged pt to give meds some time to work before leaving.  ?   ?Recommendations for follow up therapy are one component of a multi-disciplinary discharge planning process, led by the attending physician.  Recommendations may be updated based on patient status, additional functional criteria and insurance authorization. ? ?Follow Up Recommendations ? Follow physician's recommendations for discharge plan and follow up therapies (HEP) ?  ?  ?Assistance Recommended at Discharge Intermittent Supervision/Assistance  ?Patient can return home with the following A little help with walking and/or transfers;A little help with bathing/dressing/bathroom;Assistance with cooking/housework;Assist for transportation;Help with stairs or ramp for entrance ?  ?Equipment Recommendations ? Rolling walker (2 wheels)  ?  ?Recommendations for Other Services   ? ? ?  ?Precautions / Restrictions Precautions ?Precautions: Fall ?Restrictions ?Weight Bearing Restrictions: No ?LLE Weight Bearing: Weight bearing as tolerated  ?  ? ?Mobility ? Bed Mobility ?Overal bed mobility: Needs Assistance ?Bed Mobility: Supine to Sit ?  ?  ? ?  ?  ?General bed mobility comments: oob in recliner ?  ? ?Transfers ?Overall transfer level: Needs assistance ?Equipment used: Rolling walker (2 wheels) ?Transfers: Sit to/from Stand ?Sit to Stand: Min guard ?  ?  ?  ?  ?  ?General  transfer comment: Min guard for safety. Increased time. Cues for safety, technique, sequence. ?  ? ?Ambulation/Gait ?Ambulation/Gait assistance: Min guard ?Gait Distance (Feet): 75 Feet ?Assistive device: Rolling walker (2 wheels) ?Gait Pattern/deviations: Decreased stride length, Step-to pattern ?  ?  ?  ?General Gait Details: Cues for safety, step length, Rw proximity. Slow but steady gait. Pt denied dizziness. ? ? ?Stairs ?Stairs: Yes ?Stairs assistance: Min assist ?Stair Management: Step to pattern, Backwards, With walker ?Number of Stairs: 2 ?General stair comments: VCs safety, technique, sequence. Assist to stabilize walker. Daughter present to observe and practice with pt. ? ? ?Wheelchair Mobility ?  ? ?Modified Rankin (Stroke Patients Only) ?  ? ? ?  ?Balance Overall balance assessment: Needs assistance ?  ?  ?  ?  ?Standing balance support: Bilateral upper extremity supported, Reliant on assistive device for balance, During functional activity ?Standing balance-Leahy Scale: Fair ?  ?  ?  ?  ?  ?  ?  ?  ?  ?  ?  ?  ?  ? ?  ?Cognition Arousal/Alertness: Awake/alert ?Behavior During Therapy: [facility] for tasks assessed/performed ?Overall Cognitive Status: Within Functional Limits for tasks assessed ?  ?  ?  ?  ?  ?  ?  ?  ?  ?  ?  ?  ?  ?  ?  ?  ?  ?  ?  ? ?  ?Exercises Total Joint Exercises ? ? ? ?Hip ABduction/ADduction: AROM, Left, 5 reps, Standing ?Knee Flexion: AROM, Left, 5 reps, Standing ?Marching in Standing:  (unable 2* pain this afternoon) ?General Exercises - Lower Extremity ?Heel  Raises: AROM, Both, 5 reps, Standing ? ?  ?General Comments   ?  ?  ? ?Pertinent Vitals/Pain Pain Assessment ?Pain Assessment: 0-10 ?Pain Score: 8  ?Pain Location: L hip/thigh ?Pain Descriptors / Indicators: Burning, Discomfort, Sore, Tightness ?Pain Intervention(s): Limited activity within patient's tolerance, Monitored during session, Ice applied, Repositioned, Patient requesting pain meds-RN notified  ? ? ?Home Living   ?  ?   ?  ?  ?  ?  ?  ?  ?  ?   ?  ?Prior Function    ?  ?  ?   ? ?PT Goals (current goals can now be found in the care plan section) Acute Rehab PT Goals ?Patient Stated Goal: regain PLOF/independence. Less pain. ?PT Goal Formulation: With patient ?Time For Goal Achievement: 11/10/21 ?Potential to Achieve Goals: Good ?Progress towards PT goals: Progressing toward goals ? ?  ?Frequency ? ? ? 7X/week ? ? ? ?  ?PT Plan Current plan remains appropriate  ? ? ?Co-evaluation   ?  ?  ?  ?  ? ?  ?AM-PAC PT "6 Clicks" Mobility   ?Outcome Measure ? Help needed turning from your back to your side while in a flat bed without using bedrails?: A Little ?Help needed moving from lying on your back to sitting on the side of a flat bed without using bedrails?: A Little ?Help needed moving to and from a bed to a chair (including a wheelchair)?: A Little ?Help needed standing up from a chair using your arms (e.g., wheelchair or bedside chair)?: A Little ?Help needed to walk in hospital room?: A Little ?Help needed climbing 3-5 steps with a railing? : A Little ?6 Click Score: 18 ? ?  ?End of Session Equipment Utilized During Treatment: Gait belt ?Activity Tolerance: Patient tolerated treatment well;Patient limited by pain ?Patient left: in chair;with call bell/phone within reach;with family/visitor present ?  ?PT Visit Diagnosis: Other abnormalities of gait and mobility (R26.89);Pain ?Pain - Right/Left: Left ?Pain - part of body: Hip ?  ? ? ?Time: 9357-0177 ?PT Time Calculation (min) (ACUTE ONLY): 22 min ? ?Charges:  $Gait Training: 8-22 mins          ?          ? ? ? ? ?[name], PT ?Acute Rehabilitation  ?Office: [phone] ?Pager: [phone] ? ?  ? ?

## 2021-10-27 NOTE — Progress Notes (Signed)
? ?  Subjective: ?1 Day Post-Op Procedure(s) (LRB): ?TOTAL HIP ARTHROPLASTY ANTERIOR APPROACH (Left) ?Patient seen in rounds by Dr. Wynelle Link. ?Patient is well, and has had no acute complaints or problems ?Patient reports pain as mild. She has some muscle discomfort. Foley cath not yet removed. ?She will start physical therapy today.  ? ?Objective: ?Vital signs in last 24 hours: ?Temp:  [97.8 ?F (36.6 ?C)-99 ?F (37.2 ?C)] 98.9 ?F (37.2 ?C) (03/28 2979) ?Pulse Rate:  [59-82] 72 (03/28 0627) ?Resp:  [11-18] 18 (03/28 8921) ?BP: (116-164)/(55-68) 116/55 (03/28 1941) ?SpO2:  [92 %-99 %] 95 % (03/28 0627) ?Weight:  [86.2 kg] 86.2 kg (03/27 1029) ? ?Intake/Output from previous day: ? ?Intake/Output Summary (Last 24 hours) at 10/27/2021 0724 ?Last data filed at 10/27/2021 0600 ?Gross per 24 hour  ?Intake 3015.77 ml  ?Output 2300 ml  ?Net 715.77 ml  ?  ? ?Intake/Output this shift: ?No intake/output data recorded. ? ?Labs: ?Recent Labs  ?  10/27/21 ?0307  ?HGB 12.4  ? ?Recent Labs  ?  10/27/21 ?0307  ?WBC 19.7*  ?RBC 4.14  ?HCT 36.0  ?PLT 210  ? ?Recent Labs  ?  10/27/21 ?0307  ?NA 137  ?K 3.8  ?CL 103  ?CO2 28  ?BUN 17  ?CREATININE 0.65  ?GLUCOSE 150*  ?CALCIUM 8.5*  ? ?No results for input(s): LABPT, INR in the last 72 hours. ? ?Exam: ?General - Patient is Alert and Oriented ?Extremity - Neurologically intact ?Neurovascular intact ?Sensation intact distally ?Dorsiflexion/Plantar flexion intact ?Dressing - dressing C/D/I ?Motor Function - intact, moving foot and toes well on exam. ? ?Past Medical History:  ?Diagnosis Date  ? Arthritis   ? Back pain   ? CAD (coronary artery disease)   ? Chest pain   ? Complication of anesthesia   ? Constipation   ? Coronary arteriosclerosis 05/04/2016  ? Diverticulitis   ? Diverticulosis   ? Family history of adverse reaction to anesthesia   ? Mom PONV  ? Heart attack ([place]) 2010  ? History of carpal tunnel syndrome   ? Bilateral  ? History of kidney stones   ? Hyperlipidemia   ? Hypertension   ?  Intra-abdominal abscess ([place])   ? Joint pain   ? Morbid obesity ([place])   ? Myocardial infarction ([place]) 05/04/2009  ? PONV (postoperative nausea and vomiting)   ? Pre-diabetes   ? Skin cancer   ? on nose  ? Sleep apnea   ? not using cpap at this time  ? Vitamin D deficiency   ? ? ?Assessment/Plan: ?1 Day Post-Op Procedure(s) (LRB): ?TOTAL HIP ARTHROPLASTY ANTERIOR APPROACH (Left) ?Principal Problem: ?  OA (osteoarthritis) of hip ?Active Problems: ?  Osteoarthritis of left hip ? ?Estimated body mass index is 33.66 kg/m? as calculated from the following: ?  Height as of this encounter: '5\' 3"'$  (1.6 m). ?  Weight as of this encounter: 86.2 kg. ?Advance diet ?Up with therapy ?D/C IV fluids ? ?DVT Prophylaxis - Xarelto ?Weight bearing as tolerated. ?Continue therapy. ? ?Plan is to go Home after hospital stay. Expected discharge today pending physical therapy session. She will do a home exercise program. Follow-up in office in 2 weeks. ? ?The PDMP database was reviewed today prior to any opioid medications being prescribed to this patient. ? ?R. Jaynie Bream, PA-C ?Orthopedic Surgery ?[phone] ?10/27/2021, 7:24 AM  ?

## 2021-10-27 NOTE — Evaluation (Signed)
Physical Therapy Evaluation ?Patient Details ?Name: Heather Lam ?MRN: [id number] ?DOB: 09/03/1960 ?Today's Date: 10/27/2021 ? ?History of Present Illness ? 61 yo female s/p L THA-DA 3/27. Hx of MI, back sg  ?Clinical Impression ? On eval, pt was Min guard-Min A for mobility. She walked ~75 feet with a RW. Moderate pain with activity. Plan is for d/c home later today after 2nd session. Will provide pt with HEP.  ?   ? ?Recommendations for follow up therapy are one component of a multi-disciplinary discharge planning process, led by the attending physician.  Recommendations may be updated based on patient status, additional functional criteria and insurance authorization. ? ?Follow Up Recommendations Follow physician's recommendations for discharge plan and follow up therapies (plan is for HEP) ? ?  ?Assistance Recommended at Discharge Intermittent Supervision/Assistance  ?Patient can return home with the following ? A little help with walking and/or transfers;A little help with bathing/dressing/bathroom;Assistance with cooking/housework;Assist for transportation;Help with stairs or ramp for entrance ? ?  ?Equipment Recommendations Rolling walker (2 wheels)  ?Recommendations for Other Services ?    ?  ?Functional Status Assessment Patient has had a recent decline in their functional status and demonstrates the ability to make significant improvements in function in a reasonable and predictable amount of time.  ? ?  ?Precautions / Restrictions Precautions ?Precautions: Fall ?Restrictions ?Weight Bearing Restrictions: No ?LLE Weight Bearing: Weight bearing as tolerated  ? ?  ? ?Mobility ? Bed Mobility ?Overal bed mobility: Needs Assistance ?Bed Mobility: Supine to Sit ?  ?  ?Supine to sit: Min assist, HOB elevated ?  ?  ?General bed mobility comments: Pt used gait belt as leg lifter to assist L LE off bed. Increased time. Small amount of assist for trunk ?  ? ?Transfers ?Overall transfer level: Needs  assistance ?Equipment used: Rolling walker (2 wheels) ?Transfers: Sit to/from Stand ?Sit to Stand: Min guard ?  ?  ?  ?  ?  ?General transfer comment: Min guard for safety. Increased time. Cues for safety, technique, sequence. ?  ? ?Ambulation/Gait ?Ambulation/Gait assistance: Min guard ?Gait Distance (Feet): 75 Feet ?Assistive device: Rolling walker (2 wheels) ?Gait Pattern/deviations: Decreased stride length ?  ?  ?  ?General Gait Details: Cues for safety, step length, Rw proximity. Slow but steady gait. Pt denied dizziness. ? ?Stairs ?  ?  ?  ?  ?  ? ?Wheelchair Mobility ?  ? ?Modified Rankin (Stroke Patients Only) ?  ? ?  ? ?Balance Overall balance assessment: Needs assistance ?  ?  ?  ?  ?Standing balance support: Bilateral upper extremity supported, Reliant on assistive device for balance, During functional activity ?Standing balance-Leahy Scale: Poor ?  ?  ?  ?  ?  ?  ?  ?  ?  ?  ?  ?  ?   ? ? ? ?Pertinent Vitals/Pain Pain Assessment ?Pain Assessment: 0-10 ?Pain Score: 7  ?Pain Location: L hip ?Pain Descriptors / Indicators: Discomfort, Sore ?Pain Intervention(s): Limited activity within patient's tolerance, Monitored during session, Repositioned  ? ? ?Home Living Family/patient expects to be discharged to:: Private residence ?Living Arrangements: Alone ?Available Help at Discharge: Family;Available 24 hours/day ?Type of Home: House ?Home Access: Stairs to enter ?Entrance Stairs-Rails: None ?Entrance Stairs-Number of Steps: 2 ?  ?Home Layout: One level ?Home Equipment: Rollator (4 wheels);Cane - single point;Toilet riser ?   ?  ?Prior Function Prior Level of Function : Independent/Modified Independent ?  ?  ?  ?  ?  ?  ?  ?  ?  ? ? ?  Hand Dominance  ?   ? ?  ?Extremity/Trunk Assessment  ? Upper Extremity Assessment ?Upper Extremity Assessment: Overall WFL for tasks assessed ?  ? ?Lower Extremity Assessment ?Lower Extremity Assessment: Generalized weakness ?  ? ?Cervical / Trunk Assessment ?Cervical / Trunk  Assessment: Normal  ?Communication  ? Communication: No difficulties  ?Cognition Arousal/Alertness: Awake/alert ?Behavior During Therapy: [facility] for tasks assessed/performed ?Overall Cognitive Status: Within Functional Limits for tasks assessed ?  ?  ?  ?  ?  ?  ?  ?  ?  ?  ?  ?  ?  ?  ?  ?  ?  ?  ?  ? ?  ?General Comments   ? ?  ?Exercises Total Joint Exercises ?Ankle Circles/Pumps: AROM, Both, 10 reps ?Quad Sets: AROM, Both, 10 reps ?Heel Slides: AAROM, Left, 10 reps ?Hip ABduction/ADduction: AAROM, Left, 10 reps  ? ?Assessment/Plan  ?  ?PT Assessment Patient needs continued PT services  ?PT Problem List Decreased strength;Decreased mobility;Decreased range of motion;Decreased activity tolerance;Decreased balance;Decreased knowledge of use of DME;Pain ? ?   ?  ?PT Treatment Interventions DME instruction;Therapeutic exercise;Gait training;Balance training;Stair training;Functional mobility training;Therapeutic activities;Patient/family education   ? ?PT Goals (Current goals can be found in the Care Plan section)  ?Acute Rehab PT Goals ?Patient Stated Goal: regain PLOF/independence. Less pain. ?PT Goal Formulation: With patient ?Time For Goal Achievement: 11/10/21 ?Potential to Achieve Goals: Good ? ?  ?Frequency 7X/week ?  ? ? ?Co-evaluation   ?  ?  ?  ?  ? ? ?  ?AM-PAC PT "6 Clicks" Mobility  ?Outcome Measure Help needed turning from your back to your side while in a flat bed without using bedrails?: A Little ?Help needed moving from lying on your back to sitting on the side of a flat bed without using bedrails?: A Little ?Help needed moving to and from a bed to a chair (including a wheelchair)?: A Little ?Help needed standing up from a chair using your arms (e.g., wheelchair or bedside chair)?: A Little ?Help needed to walk in hospital room?: A Little ?Help needed climbing 3-5 steps with a railing? : A Little ?6 Click Score: 18 ? ?  ?End of Session Equipment Utilized During Treatment: Gait belt ?Activity Tolerance:  Patient tolerated treatment well ?Patient left: in chair;with call bell/phone within reach ?  ?PT Visit Diagnosis: Other abnormalities of gait and mobility (R26.89);Pain ?Pain - Right/Left: Left ?Pain - part of body: Hip ?  ? ?Time: 9675-9163 ?PT Time Calculation (min) (ACUTE ONLY): 34 min ? ? ?Charges:   PT Evaluation ?$PT Eval Low Complexity: 1 Low ?PT Treatments ?$Gait Training: 8-22 mins ?  ?   ? ? ? ? ? ?[name], PT ?Acute Rehabilitation  ?Office: [phone] ?Pager: [phone] ? ?  ? ?

## 2021-10-27 NOTE — TOC Transition Note (Signed)
Transition of Care (TOC) - CM/SW Discharge Note ? ? ?Patient Details  ?Name: Heather Lam ?MRN: [id number] ?Date of Birth: 1960/12/09 ? ?Transition of Care (TOC) CM/SW Contact:  ?Sharronda Schweers, LCSW ?Phone Number: ?10/27/2021, 9:22 AM ? ? ?Clinical Narrative:    ? ?Met with pt and confirming she plans for HEP.  She does need a rw - no agency pref - order placed with [employer] for delivery to room.  No further TOC needs. ? ?Final next level of care: Home/Self Care ?Barriers to Discharge: No Barriers Identified ? ? ?Patient Goals and CMS Choice ?Patient states their goals for this hospitalization and ongoing recovery are:: return home ?  ?  ? ?Discharge Placement ?  ?           ?  ?  ?  ?  ? ?Discharge Plan and Services ?  ?  ?           ?DME Arranged: Walker rolling ?DME Agency: [employer] ?Date DME Agency Contacted: 10/27/21 ?Time DME Agency Contacted: [phone] ?Representative spoke with at DME Agency: Wells Guiles ?  ?  ?  ?  ?  ? ?Social Determinants of Health (SDOH) Interventions ?  ? ? ?Readmission Risk Interventions ?   ? View : No data to display.  ?  ?  ?  ? ? ? ? ? ?

## 2021-10-28 NOTE — Discharge Summary (Signed)
Physician Discharge Summary  ? ?Patient ID: ?Heather Lam ?MRN: [id number] ?DOB/AGE: 02-04-1961 61 y.o. ? ?Admit date: 10/26/2021 ?Discharge date: 10/27/2021 ? ?Primary Diagnosis: osteoarthritis of left hip  ? ?Admission Diagnoses:  ?Past Medical History:  ?Diagnosis Date  ? Arthritis   ? Back pain   ? CAD (coronary artery disease)   ? Chest pain   ? Complication of anesthesia   ? Constipation   ? Coronary arteriosclerosis 05/04/2016  ? Diverticulitis   ? Diverticulosis   ? Family history of adverse reaction to anesthesia   ? Mom PONV  ? Heart attack ([place]) 2010  ? History of carpal tunnel syndrome   ? Bilateral  ? History of kidney stones   ? Hyperlipidemia   ? Hypertension   ? Intra-abdominal abscess ([place])   ? Joint pain   ? Morbid obesity ([place])   ? Myocardial infarction ([place]) 05/04/2009  ? PONV (postoperative nausea and vomiting)   ? Pre-diabetes   ? Skin cancer   ? on nose  ? Sleep apnea   ? not using cpap at this time  ? Vitamin D deficiency   ? ?Discharge Diagnoses:   ?Principal Problem: ?  OA (osteoarthritis) of hip ?Active Problems: ?  Osteoarthritis of left hip ? ?Estimated body mass index is 33.66 kg/m? as calculated from the following: ?  Height as of this encounter: '5\' 3"'$  (1.6 m). ?  Weight as of this encounter: 86.2 kg. ? ?Procedure:  ?Procedure(s) (LRB): ?TOTAL HIP ARTHROPLASTY ANTERIOR APPROACH (Left)  ? ?Consults: None ? ?HPI: Heather Lam is a 61 y.o. female who has advanced end- stage arthritis of their left hip with progressively worsening pain and dysfunction.The patient has failed nonoperative management and presents for total hip arthroplasty. ? ?Laboratory Data: ?Admission on 10/26/2021, Discharged on 10/27/2021  ?Component Date Value Ref Range Status  ? WBC 10/27/2021 19.7 (H)  4.0 - 10.5 K/uL Final  ? RBC 10/27/2021 4.14  3.87 - 5.11 MIL/uL Final  ? Hemoglobin 10/27/2021 12.4  12.0 - 15.0 g/dL Final  ? HCT 10/27/2021 36.0  36.0 - 46.0 % Final  ? MCV 10/27/2021 87.0  80.0 - 100.0 fL Final  ?  MCH 10/27/2021 30.0  26.0 - 34.0 pg Final  ? MCHC 10/27/2021 34.4  30.0 - 36.0 g/dL Final  ? RDW 10/27/2021 13.2  11.5 - 15.5 % Final  ? Platelets 10/27/2021 210  150 - 400 K/uL Final  ? nRBC 10/27/2021 0.0  0.0 - 0.2 % Final  ? Performed at [facility], [place] [address] [place], [place] [address]  ? Sodium 10/27/2021 137  135 - 145 mmol/L Final  ? Potassium 10/27/2021 3.8  3.5 - 5.1 mmol/L Final  ? Chloride 10/27/2021 103  98 - 111 mmol/L Final  ? CO2 10/27/2021 28  22 - 32 mmol/L Final  ? Glucose, Bld 10/27/2021 150 (H)  70 - 99 mg/dL Final  ? Glucose reference range applies only to samples taken after fasting for at least 8 hours.  ? BUN 10/27/2021 17  6 - 20 mg/dL Final  ? Creatinine, Ser 10/27/2021 0.65  0.44 - 1.00 mg/dL Final  ? Calcium 10/27/2021 8.5 (L)  8.9 - 10.3 mg/dL Final  ? GFR, Estimated 10/27/2021 >60  >60 mL/min Final  ? Comment: (NOTE) ?Calculated using the CKD-EPI Creatinine Equation (2021) ?  ? Anion gap 10/27/2021 6  5 - 15 Final  ? Performed at [facility], [place] [address] [place], [place]  [address]  ?Hospital Outpatient Visit on 10/16/2021  ?Component Date Value Ref Range Status  ? MRSA, PCR 10/16/2021 NEGATIVE  NEGATIVE Final  ? Staphylococcus aureus 10/16/2021 NEGATIVE  NEGATIVE Final  ? Comment: (NOTE) ?The Xpert SA Assay (FDA approved for NASAL specimens in patients 45 ?years of age and older), is one component of a comprehensive ?surveillance program. It is not intended to diagnose infection nor to ?guide or monitor treatment. ?Performed at [facility], [place] [address] ?[place], [place] [address] ?  ? WBC 10/16/2021 9.6  4.0 - 10.5 K/uL Final  ? RBC 10/16/2021 4.81  3.87 - 5.11 MIL/uL Final  ? Hemoglobin 10/16/2021 13.8  12.0 - 15.0 g/dL Final  ? HCT 10/16/2021 42.2  36.0 - 46.0 % Final  ? MCV 10/16/2021 87.7  80.0 - 100.0 fL Final  ? MCH 10/16/2021 28.7  26.0 - 34.0 pg Final  ? MCHC 10/16/2021 32.7  30.0 - 36.0 g/dL Final  ? RDW  10/16/2021 13.4  11.5 - 15.5 % Final  ? Platelets 10/16/2021 223  150 - 400 K/uL Final  ? nRBC 10/16/2021 0.0  0.0 - 0.2 % Final  ? Performed at [facility], [place] [address] [place], [place] [address]  ? Sodium 10/16/2021 136  135 - 145 mmol/L Final  ? Potassium 10/16/2021 4.0  3.5 - 5.1 mmol/L Final  ? Chloride 10/16/2021 101  98 - 111 mmol/L Final  ? CO2 10/16/2021 30  22 - 32 mmol/L Final  ? Glucose, Bld 10/16/2021 139 (H)  70 - 99 mg/dL Final  ? Glucose reference range applies only to samples taken after fasting for at least 8 hours.  ? BUN 10/16/2021 15  6 - 20 mg/dL Final  ? Creatinine, Ser 10/16/2021 0.57  0.44 - 1.00 mg/dL Final  ? Calcium 10/16/2021 9.1  8.9 - 10.3 mg/dL Final  ? Total Protein 10/16/2021 7.2  6.5 - 8.1 g/dL Final  ? Albumin 10/16/2021 3.9  3.5 - 5.0 g/dL Final  ? AST 10/16/2021 22  15 - 41 U/L Final  ? ALT 10/16/2021 19  0 - 44 U/L Final  ? Alkaline Phosphatase 10/16/2021 64  38 - 126 U/L Final  ? Total Bilirubin 10/16/2021 0.2 (L)  0.3 - 1.2 mg/dL Final  ? GFR, Estimated 10/16/2021 >60  >60 mL/min Final  ? Comment: (NOTE) ?Calculated using the CKD-EPI Creatinine Equation (2021) ?  ? Anion gap 10/16/2021 5  5 - 15 Final  ? Performed at [facility], [place] [address] [place], [place] [address]  ? Hgb A1c MFr Bld 10/16/2021 5.8 (H)  4.8 - 5.6 % Final  ? Comment: (NOTE) ?Pre diabetes:          5.7%-6.4% ? ?Diabetes:              >6.4% ? ?Glycemic control for   <7.0% ?adults with diabetes ?  ? Mean Plasma Glucose 10/16/2021 119.76  mg/dL Final  ? Performed at [place] [address], [place], [place] [address]  ? ABO/RH(D) 10/16/2021 O POS   Final  ? Antibody Screen 10/16/2021 NEG   Final  ? Sample Expiration 10/16/2021 10/29/2021,2359   Final  ? Extend sample reason 10/16/2021    Final  ?                 Value:NO TRANSFUSIONS OR PREGNANCY IN THE PAST 3 MONTHS ?Performed at [facility], [place] [address] [place], [place]  [address] ?  ? Glucose-Capillary 10/16/2021 158 (H)  70 - 99 mg/dL Final  ? Glucose reference range applies only to samples taken after fasting for at least 8 hours.  ?Office Visit on 09/16/2021  ?Component Date Value Ref Range Status  ? SURGICAL PATHOLOGY 09/16/2021    Final-Edited  ?                 Value:SURGICAL PATHOLOGY ?CASE: [id number] [id number] ?PATIENT: Mckensi Dutson ?Surgical Pathology Report ? ? ? ? ?Clinical History: Atypical mole, skin lesion (nt) ? ? ? ? ?FINAL MICROSCOPIC DIAGNOSIS: ? ?A.   SKIN, RIGHT SHOULDER, BIOPSY: ?-    Intradermal nevus. ? ? ? ?GROSS DESCRIPTION: ? ? ? ? ? ?Final Diagnosis performed by Maryan Puls, MD.   Electronically signed ?09/18/2021 ?Technical and / or Professional components performed at [employer]. [place] ?[facility], [place] [address] [place], [place] [address]. ? Immunohistochemistry Technical component (if applicable) was performed ?at [facility]. [place], [address], ?[place], [place] [address].   IMMUNOHISTOCHEMISTRY DISCLAIMER (if applicable): ?Some of these immunohistochemical stains may have been developed and the ?performance characteristics determine by [facility]. Some ?may not have been cleared or approved by the U.S. Food and Drug ?Administration. The FDA has determined that such clearance or approv  ?                       al ?is not necessary. This test is used for clinical purposes. It should not ?be regarded as investigational or for research. This laboratory is ?certified under the Clinical Laboratory Improvement Amendments of 1988 ?(CLIA-88) as qualified to perform high complexity clinical laboratory ?testing.  The controls stained appropriately. ?  ?  ? ?X-Rays:DG Pelvis Portable ? ?Result Date: 10/26/2021 ?CLINICAL DATA:  Postop left hip arthroplasty EXAM: PORTABLE PELVIS 1-2 VIEWS COMPARISON:  10/26/2021 FINDINGS: Frontal view of the pelvis includes both hips. Unremarkable left hip arthroplasty in the expected position  without signs of acute complication. Postsurgical changes in the soft tissues overlying the left hip. Mild right hip osteoarthritis. No acute bony abnormalities. Pelvic calcifications consistent with uteri

## 2021-11-04 ENCOUNTER — Other Ambulatory Visit: Payer: Self-pay | Admitting: Family Medicine

## 2021-11-04 ENCOUNTER — Other Ambulatory Visit (HOSPITAL_COMMUNITY): Payer: Self-pay

## 2021-11-04 MED ORDER — METOPROLOL TARTRATE 25 MG PO TABS
ORAL_TABLET | Freq: Two times a day (BID) | ORAL | 0 refills | Status: DC
  Filled 2021-11-04: qty 180, 90d supply, fill #0

## 2021-11-05 ENCOUNTER — Telehealth (HOSPITAL_COMMUNITY): Payer: Self-pay

## 2021-11-05 ENCOUNTER — Other Ambulatory Visit (HOSPITAL_COMMUNITY): Payer: Self-pay

## 2021-11-05 NOTE — Telephone Encounter (Signed)
Pharmacy Transitions of Care Follow-up Telephone Call ? ?Date of discharge: 10/27/2021  ?Discharge Diagnosis: DVT ppx for OA Total hip arthoplasty ? ?How have you been since you were released from the hospital? Patient reports feeling good since discharge. She has stopped taking oxycodone since it makes her foggy and is taking acetaminophen and methocarbamol for pain. ? ?Medication changes made at discharge: ?START taking: ?methocarbamol (ROBAXIN)  ?oxyCODONE (Oxy IR/ROXICODONE)  ?Xarelto (rivaroxaban)  ?STOP taking: ?aspirin 325 MG tablet  ?calcium carbonate 1500 (600 Ca) MG Tabs tablet (OSCAL)  ?HYDROcodone-acetaminophen 5-325 MG tablet (Norco)  ?ibuprofen 200 MG tablet (ADVIL)  ?multivitamin tablet  ? ?Medication changes verified by the patient? yes  ?  ? ?Medication Accessibility: ?Home Pharmacy: [facility]  ? ?Was the patient provided with refills on discharged medications? No, patient knows to request refills at orthopedic appointment. ? ?Have all prescriptions been transferred from [facility] to home pharmacy? Yes ? ?Is the patient able to afford medications? Yes ? ?Medication Review: ?RIVAROXABAN (XARELTO)  ?Taking rivaroxaban 10 mg daily then will discontinue and resume aspirin 325 mg daily on 11/16/2021. ?   ?- Discussed importance of taking medication with food and around the same time everyday  ?- Advised patient of medications to avoid (NSAIDs,)  ?- Educated that Tylenol (acetaminophen) will be the preferred analgesic to prevent risk of bleeding  ?- Emphasized importance of monitoring for signs and symptoms of bleeding (abnormal bruising, prolonged bleeding, nose bleeds, bleeding from gums, discolored urine, black tarry stools)  ?- Advised patient to alert all providers of anticoagulation therapy prior to starting a new medication or having a procedure  ? ?Follow-up Appointments: ? ?[facility] f/u appt confirmed? Scheduled to see [facility] on 11/11/2021 with Dr. Gaynelle Arabian.  ? ?If their condition  worsens, is the pt aware to call PCP or go to the Emergency Dept.? yes ? ?Final Patient Assessment: ?-Pt is doing well since discharge. ?-Pt verbalized understanding of Rivaroxaban and knows to discontinue and restart aspirin 325 mg after 20 days. ?-Pt has post discharge appointment scheduled with [facility] and knows to send refills to [facility]. ? ? ?

## 2021-11-18 ENCOUNTER — Other Ambulatory Visit: Payer: Self-pay | Admitting: Family Medicine

## 2021-11-18 ENCOUNTER — Encounter: Payer: Self-pay | Admitting: Family Medicine

## 2021-11-18 ENCOUNTER — Other Ambulatory Visit (HOSPITAL_COMMUNITY): Payer: Self-pay

## 2021-11-18 DIAGNOSIS — F411 Generalized anxiety disorder: Secondary | ICD-10-CM

## 2021-11-18 MED ORDER — DULOXETINE HCL 30 MG PO CPEP
30.0000 mg | ORAL_CAPSULE | Freq: Every day | ORAL | 1 refills | Status: DC
  Filled 2021-11-18: qty 30, 30d supply, fill #0

## 2021-11-23 ENCOUNTER — Other Ambulatory Visit (HOSPITAL_COMMUNITY): Payer: Self-pay

## 2021-12-31 ENCOUNTER — Encounter: Payer: Self-pay | Admitting: Family Medicine

## 2022-01-01 ENCOUNTER — Other Ambulatory Visit (HOSPITAL_COMMUNITY): Payer: Self-pay

## 2022-01-01 ENCOUNTER — Other Ambulatory Visit: Payer: Self-pay | Admitting: Family Medicine

## 2022-01-01 DIAGNOSIS — F411 Generalized anxiety disorder: Secondary | ICD-10-CM

## 2022-01-01 MED ORDER — DULOXETINE HCL 30 MG PO CPEP
30.0000 mg | ORAL_CAPSULE | Freq: Every day | ORAL | 3 refills | Status: DC
  Filled 2022-01-01: qty 90, 90d supply, fill #0
  Filled 2022-04-13: qty 90, 90d supply, fill #1
  Filled 2022-07-21: qty 90, 90d supply, fill #2
  Filled 2022-10-19: qty 90, 90d supply, fill #3

## 2022-01-13 ENCOUNTER — Other Ambulatory Visit (HOSPITAL_COMMUNITY): Payer: Self-pay

## 2022-01-13 ENCOUNTER — Ambulatory Visit (INDEPENDENT_AMBULATORY_CARE_PROVIDER_SITE_OTHER): Payer: No Typology Code available for payment source

## 2022-01-13 ENCOUNTER — Encounter: Payer: Self-pay | Admitting: Family Medicine

## 2022-01-13 ENCOUNTER — Ambulatory Visit (INDEPENDENT_AMBULATORY_CARE_PROVIDER_SITE_OTHER): Payer: No Typology Code available for payment source | Admitting: Family Medicine

## 2022-01-13 VITALS — BP 158/88 | HR 80 | Temp 98.1°F | Ht 63.0 in | Wt 190.0 lb

## 2022-01-13 DIAGNOSIS — M79672 Pain in left foot: Secondary | ICD-10-CM

## 2022-01-13 DIAGNOSIS — R2242 Localized swelling, mass and lump, left lower limb: Secondary | ICD-10-CM

## 2022-01-13 LAB — D-DIMER, QUANTITATIVE: D-DIMER: 0.41 mg/L FEU (ref 0.00–0.49)

## 2022-01-13 MED ORDER — PREDNISONE 20 MG PO TABS: 40.0000 mg | ORAL_TABLET | Freq: Every day | ORAL | 0 refills | Status: AC

## 2022-01-13 MED ORDER — METHYLPREDNISOLONE ACETATE 80 MG/ML IJ SUSP
80.0000 mg | Freq: Once | INTRAMUSCULAR | Status: AC
  Administered 2022-01-13: 80 mg via INTRAMUSCULAR

## 2022-01-13 MED ORDER — PREDNISONE 20 MG PO TABS
40.0000 mg | ORAL_TABLET | Freq: Every day | ORAL | 0 refills | Status: DC
  Filled 2022-01-13: qty 10, 5d supply, fill #0

## 2022-01-13 NOTE — Progress Notes (Signed)
   Acute Office Visit  Subjective:     Patient ID: Heather Lam, female    DOB: Jul 04, 1961, 61 y.o.   MRN: [id number]  Chief Complaint  Patient presents with   Foot Pain    HPI Patient is in today for left foot pain and swelling x 2 days. She reports pain on the dorsal aspect on the medial side. It was a mild pain yesterday that was worse with weight bearing. She was walking on the lateral aspect of her yesterday due to the pain. This morning the medial dorsal aspect of her foot is swollen with increased pain. The pain is severe. She has not been able to bear weight. She did take an oxycodone that she had left over from her hip surgery. This was enough to take the edge off. She has taken advil without improvement. She denies injury, increased activity, erythema, rash, decreased ROM, fever, or chills. She is 11 weeks post op from hip surgery. She had tried to rest and elevate her foot.   ROS As per HPI.      Objective:    BP (!) 158/88   Pulse 80   Temp 98.1 F (36.7 C) (Temporal)   Ht '5\' 3"'$  (1.6 m)   Wt 190 lb (86.2 kg)   LMP 12/31/2013   BMI 33.66 kg/m  BP Readings from Last 3 Encounters:  01/13/22 (!) 158/88  10/27/21 (!) 144/66  10/16/21 (!) 163/78      Physical Exam Vitals and nursing note reviewed.  Constitutional:      General: She is not in acute distress.    Appearance: She is not ill-appearing, toxic-appearing or diaphoretic.  Pulmonary:     Effort: Pulmonary effort is normal. No respiratory distress.  Musculoskeletal:     Left foot: Normal range of motion and normal capillary refill. Swelling (dorsal medial to mid aspect) and tenderness (dorsal medial to mid aspect) present. No deformity or bony tenderness.  Neurological:     General: No focal deficit present.     Mental Status: She is alert and oriented to person, place, and time.  Psychiatric:        Mood and Affect: Mood normal.        Behavior: Behavior normal.     No results found for any visits  on 01/13/22.      Assessment & Plan:   Hue was seen today for foot pain.  Diagnoses and all orders for this visit:  Left foot pain Localized swelling of left foot Negative xray today. Will order d dimer to evaluate for possible blood clot given recent surgery. Ordered stat- she will check mychart tonight and proceed to ER if elevated. Steroid IM injection today in office for inflammation. Start prednisone burst tomorrow. Discussed RICE therapy. Return to office for new or worsening symptoms, or if symptoms persist.  -     DG Foot Complete Left; Future -     methylPREDNISolone acetate (DEPO-MEDROL) injection 80 mg -     predniSONE (DELTASONE) 20 MG tablet; Take 2 tablets (40 mg total) by mouth daily with breakfast for 5 days. -     D-dimer, quantitative  The patient indicates understanding of these issues and agrees with the plan.  Gwenlyn Perking, FNP

## 2022-01-15 ENCOUNTER — Other Ambulatory Visit: Payer: Self-pay | Admitting: Family Medicine

## 2022-01-15 ENCOUNTER — Other Ambulatory Visit (HOSPITAL_COMMUNITY): Payer: Self-pay

## 2022-01-15 MED ORDER — LOSARTAN POTASSIUM-HCTZ 100-25 MG PO TABS
1.0000 | ORAL_TABLET | Freq: Every day | ORAL | 0 refills | Status: DC
  Filled 2022-01-15: qty 90, 90d supply, fill #0

## 2022-01-20 ENCOUNTER — Telehealth: Payer: Self-pay | Admitting: Pharmacist

## 2022-01-20 ENCOUNTER — Other Ambulatory Visit (HOSPITAL_COMMUNITY): Payer: Self-pay

## 2022-01-20 DIAGNOSIS — E669 Obesity, unspecified: Secondary | ICD-10-CM

## 2022-01-20 MED ORDER — WEGOVY 0.5 MG/0.5ML ~~LOC~~ SOAJ
0.5000 mg | SUBCUTANEOUS | 1 refills | Status: DC
  Filled 2022-01-20: qty 2, 28d supply, fill #0

## 2022-01-20 NOTE — Telephone Encounter (Signed)
[facility] 0.5.mg sent in for patient On sample pack 0.'25mg'$  weekly now Tolerating well  Denies personal and family history of Medullary thyroid cancer (MTC)

## 2022-01-22 ENCOUNTER — Encounter: Payer: Self-pay | Admitting: Pharmacist

## 2022-01-22 ENCOUNTER — Telehealth: Payer: Self-pay | Admitting: Pharmacist

## 2022-01-22 NOTE — Telephone Encounter (Signed)
[facility] does not have wegovy in stock, but went ahead and did PA  Please follow I will as well

## 2022-01-26 ENCOUNTER — Other Ambulatory Visit (HOSPITAL_COMMUNITY): Payer: Self-pay

## 2022-02-03 ENCOUNTER — Other Ambulatory Visit (HOSPITAL_COMMUNITY): Payer: Self-pay

## 2022-02-12 ENCOUNTER — Other Ambulatory Visit (HOSPITAL_COMMUNITY): Payer: Self-pay

## 2022-02-12 ENCOUNTER — Other Ambulatory Visit: Payer: Self-pay | Admitting: Family Medicine

## 2022-02-13 ENCOUNTER — Other Ambulatory Visit (HOSPITAL_COMMUNITY): Payer: Self-pay

## 2022-02-13 MED ORDER — METOPROLOL TARTRATE 25 MG PO TABS
ORAL_TABLET | Freq: Two times a day (BID) | ORAL | 0 refills | Status: DC
Start: 2022-02-13 — End: 2022-02-19
  Filled 2022-02-13: qty 180, 90d supply, fill #0

## 2022-02-16 ENCOUNTER — Other Ambulatory Visit: Payer: Self-pay

## 2022-02-16 DIAGNOSIS — R7303 Prediabetes: Secondary | ICD-10-CM

## 2022-02-16 DIAGNOSIS — D72829 Elevated white blood cell count, unspecified: Secondary | ICD-10-CM

## 2022-02-16 DIAGNOSIS — I10 Essential (primary) hypertension: Secondary | ICD-10-CM

## 2022-02-16 DIAGNOSIS — E785 Hyperlipidemia, unspecified: Secondary | ICD-10-CM

## 2022-02-16 DIAGNOSIS — E559 Vitamin D deficiency, unspecified: Secondary | ICD-10-CM

## 2022-02-16 DIAGNOSIS — R5383 Other fatigue: Secondary | ICD-10-CM

## 2022-02-18 ENCOUNTER — Other Ambulatory Visit (HOSPITAL_COMMUNITY): Payer: Self-pay

## 2022-02-18 ENCOUNTER — Encounter: Payer: Self-pay | Admitting: Interventional Cardiology

## 2022-02-18 ENCOUNTER — Other Ambulatory Visit: Payer: No Typology Code available for payment source

## 2022-02-18 DIAGNOSIS — E559 Vitamin D deficiency, unspecified: Secondary | ICD-10-CM

## 2022-02-18 DIAGNOSIS — R7303 Prediabetes: Secondary | ICD-10-CM

## 2022-02-18 DIAGNOSIS — R5383 Other fatigue: Secondary | ICD-10-CM

## 2022-02-18 DIAGNOSIS — D72829 Elevated white blood cell count, unspecified: Secondary | ICD-10-CM

## 2022-02-18 DIAGNOSIS — E785 Hyperlipidemia, unspecified: Secondary | ICD-10-CM

## 2022-02-18 LAB — BAYER DCA HB A1C WAIVED: HB A1C (BAYER DCA - WAIVED): 5.7 % — ABNORMAL HIGH (ref 4.8–5.6)

## 2022-02-19 ENCOUNTER — Other Ambulatory Visit: Payer: Self-pay

## 2022-02-19 ENCOUNTER — Encounter: Payer: Self-pay | Admitting: Family Medicine

## 2022-02-19 ENCOUNTER — Other Ambulatory Visit (HOSPITAL_COMMUNITY): Payer: Self-pay

## 2022-02-19 ENCOUNTER — Ambulatory Visit (INDEPENDENT_AMBULATORY_CARE_PROVIDER_SITE_OTHER): Payer: No Typology Code available for payment source | Admitting: Family Medicine

## 2022-02-19 VITALS — BP 129/70 | HR 72 | Temp 97.2°F | Ht 63.0 in | Wt 190.0 lb

## 2022-02-19 DIAGNOSIS — R413 Other amnesia: Secondary | ICD-10-CM

## 2022-02-19 DIAGNOSIS — Z0000 Encounter for general adult medical examination without abnormal findings: Secondary | ICD-10-CM

## 2022-02-19 DIAGNOSIS — Z0001 Encounter for general adult medical examination with abnormal findings: Secondary | ICD-10-CM

## 2022-02-19 DIAGNOSIS — I2510 Atherosclerotic heart disease of native coronary artery without angina pectoris: Secondary | ICD-10-CM | POA: Diagnosis not present

## 2022-02-19 DIAGNOSIS — Z9861 Coronary angioplasty status: Secondary | ICD-10-CM

## 2022-02-19 DIAGNOSIS — I10 Essential (primary) hypertension: Secondary | ICD-10-CM

## 2022-02-19 DIAGNOSIS — M130 Polyarthritis, unspecified: Secondary | ICD-10-CM

## 2022-02-19 DIAGNOSIS — Z820 Family history of epilepsy and other diseases of the nervous system: Secondary | ICD-10-CM | POA: Diagnosis not present

## 2022-02-19 DIAGNOSIS — E669 Obesity, unspecified: Secondary | ICD-10-CM

## 2022-02-19 DIAGNOSIS — G4733 Obstructive sleep apnea (adult) (pediatric): Secondary | ICD-10-CM

## 2022-02-19 DIAGNOSIS — R011 Cardiac murmur, unspecified: Secondary | ICD-10-CM

## 2022-02-19 DIAGNOSIS — Z9989 Dependence on other enabling machines and devices: Secondary | ICD-10-CM

## 2022-02-19 DIAGNOSIS — Z23 Encounter for immunization: Secondary | ICD-10-CM

## 2022-02-19 DIAGNOSIS — E782 Mixed hyperlipidemia: Secondary | ICD-10-CM | POA: Diagnosis not present

## 2022-02-19 DIAGNOSIS — M545 Low back pain, unspecified: Secondary | ICD-10-CM

## 2022-02-19 LAB — LIPID PANEL
Chol/HDL Ratio: 3.6 ratio (ref 0.0–4.4)
Cholesterol, Total: 146 mg/dL (ref 100–199)
HDL: 41 mg/dL (ref >39)
LDL Chol Calc (NIH): 64 mg/dL (ref 0–99)
Triglycerides: 257 mg/dL — ABNORMAL HIGH (ref 0–149)
VLDL Cholesterol Cal: 41 mg/dL — ABNORMAL HIGH (ref 5–40)

## 2022-02-19 LAB — CBC WITH DIFFERENTIAL/PLATELET
Basophils Absolute: 0.1 10*3/uL (ref 0.0–0.2)
Basos: 1 %
EOS (ABSOLUTE): 0.2 10*3/uL (ref 0.0–0.4)
Eos: 2 %
Hematocrit: 44.6 % (ref 34.0–46.6)
Hemoglobin: 14.5 g/dL (ref 11.1–15.9)
Immature Grans (Abs): 0 10*3/uL (ref 0.0–0.1)
Immature Granulocytes: 0 %
Lymphocytes Absolute: 2.8 10*3/uL (ref 0.7–3.1)
Lymphs: 31 %
MCH: 27.6 pg (ref 26.6–33.0)
MCHC: 32.5 g/dL (ref 31.5–35.7)
MCV: 85 fL (ref 79–97)
Monocytes Absolute: 0.6 10*3/uL (ref 0.1–0.9)
Monocytes: 6 %
Neutrophils Absolute: 5.6 10*3/uL (ref 1.4–7.0)
Neutrophils: 60 %
Platelets: 274 10*3/uL (ref 150–450)
RBC: 5.25 x10E6/uL (ref 3.77–5.28)
RDW: 13.6 % (ref 11.7–15.4)
WBC: 9.2 10*3/uL (ref 3.4–10.8)

## 2022-02-19 LAB — VITAMIN D 25 HYDROXY (VIT D DEFICIENCY, FRACTURES): Vit D, 25-Hydroxy: 37.5 ng/mL (ref 30.0–100.0)

## 2022-02-19 LAB — VITAMIN B12: Vitamin B-12: 578 pg/mL (ref 232–1245)

## 2022-02-19 MED ORDER — METOPROLOL TARTRATE 25 MG PO TABS
25.0000 mg | ORAL_TABLET | Freq: Two times a day (BID) | ORAL | 3 refills | Status: DC
  Filled 2022-02-19: qty 180, fill #0
  Filled 2022-05-26: qty 96, 48d supply, fill #0
  Filled 2022-05-26: qty 84, 42d supply, fill #0
  Filled 2022-09-24: qty 180, 90d supply, fill #1
  Filled 2023-01-20: qty 180, 90d supply, fill #2

## 2022-02-19 MED ORDER — LOSARTAN POTASSIUM-HCTZ 100-25 MG PO TABS
1.0000 | ORAL_TABLET | Freq: Every day | ORAL | 3 refills | Status: DC
  Filled 2022-02-19 – 2022-04-13 (×2): qty 90, 90d supply, fill #0
  Filled 2022-07-21: qty 90, 90d supply, fill #1
  Filled 2022-10-19: qty 90, 90d supply, fill #2
  Filled 2023-01-20: qty 90, 90d supply, fill #3

## 2022-02-19 MED ORDER — WEGOVY 1 MG/0.5ML ~~LOC~~ SOAJ
1.0000 mg | SUBCUTANEOUS | 0 refills | Status: DC
  Filled 2022-02-19: qty 2, 28d supply, fill #0

## 2022-02-19 MED ORDER — BACLOFEN 10 MG PO TABS
10.0000 mg | ORAL_TABLET | Freq: Three times a day (TID) | ORAL | 0 refills | Status: DC | PRN
  Filled 2022-02-19: qty 90, 30d supply, fill #0

## 2022-02-19 MED ORDER — ROSUVASTATIN CALCIUM 20 MG PO TABS
20.0000 mg | ORAL_TABLET | ORAL | 99 refills | Status: DC
  Filled 2022-02-19: qty 90, 157d supply, fill #0

## 2022-02-19 MED ORDER — WEGOVY 2.4 MG/0.75ML ~~LOC~~ SOAJ
2.4000 mg | SUBCUTANEOUS | 3 refills | Status: DC
  Filled 2022-02-19: qty 3, 28d supply, fill #0

## 2022-02-19 MED ORDER — WEGOVY 1.7 MG/0.75ML ~~LOC~~ SOAJ
1.7000 mg | SUBCUTANEOUS | 0 refills | Status: DC
  Filled 2022-02-19 – 2022-03-23 (×2): qty 3, 28d supply, fill #0

## 2022-02-19 MED ORDER — TRAZODONE HCL 50 MG PO TABS
ORAL_TABLET | Freq: Every evening | ORAL | 3 refills | Status: DC | PRN
  Filled 2022-02-19: qty 90, 45d supply, fill #0
  Filled 2022-08-26: qty 90, 45d supply, fill #1
  Filled 2022-12-06: qty 90, 45d supply, fill #2

## 2022-02-19 NOTE — Progress Notes (Signed)
Heather Lam is a 61 y.o. female presents to office today for annual physical exam examination.    Concerns today include: 1.  Memory changes Patient reports that she is been having some intermittent memory changes that have caused her quite a bit of concern.  She has a strong family history of Alzheimer's disease, including her father, whom she watched suffer through complications of advanced Alzheimer's.  This causes her quite a bit of angst.  She is not sure if she wants to know or not know if she has Alzheimer's because she does not want to suffer like her parent did.  She is not currently taking any medications for memory.  She denies getting lost or having difficulty managing finances.  She has not had any problems forgetting people's names or whom she loves but she does admit that sometimes she will just forget what she is doing or what she had just discussed with someone.  2.  Polyarthralgia, low back pain Patient reports polyarthralgia and inflammation.  This seemingly onset shortly after discontinuing her Cymbalta.  She reports her joints are quite stiff.  No known rheumatoid arthritis, lupus or psoriasis in the family.  She also has been having some right-sided low back pain which radiates into her right buttock but not down her lower extremity.  She had a hip replacement done earlier this year.  She had some methocarbamol but this was not helpful.  She is reluctant to take any type of NSAIDs secondary to cardiovascular disease.  She is currently treated with statin therapy.  She is currently on pulse dosing of this.  3.  Obesity associated with hypertension, hyperlipidemia and CAD She is compliant with all medications.  She is currently injecting 0.5 mg to the [facility] weekly.  No reports of nausea, vomiting or abdominal pain.  Compliant with all medications as prescribed by her cardiologist, Dr. Tamala Julian.  No chest pain, shortness of breath, visual disturbance or edema  reported  Occupation: Clinical nurse, Marital status: In a relationship, Substance use: Former smoker that quit in 2023 Diet: [place], Exercise: No structured Last eye exam: Up-to-date Last dental exam: Up-to-date Last colonoscopy: Up-to-date Last mammogram: Up-to-date Last pap smear: Sees GYN Refills needed today: All Immunizations needed: Tetanus and shingles Immunization History  Administered Date(s) Administered   Hepatitis B 08/02/2002   Influenza,inj,Quad PF,6+ Mos 05/06/2015, 05/10/2016, 05/04/2017, 04/28/2018, 05/23/2019   Influenza-Unspecified 05/27/2009, 05/14/2010, 05/22/2012, 06/02/2013, 05/02/2020   PFIZER(Purple Top)SARS-COV-2 Vaccination 03/27/2020, 04/18/2020   Td 10/16/2009   Tdap 10/16/2009, 08/02/2010     Past Medical History:  Diagnosis Date   Arthritis    Back pain    CAD (coronary artery disease)    Chest pain    Complication of anesthesia    Constipation    Coronary arteriosclerosis 05/04/2016   Diverticulitis    Diverticulosis    Family history of adverse reaction to anesthesia    Mom PONV   Heart attack ([place]) 2010   History of carpal tunnel syndrome    Bilateral   History of kidney stones    Hyperlipidemia    Hypertension    Intra-abdominal abscess (HCC)    Joint pain    Morbid obesity ([place])    Myocardial infarction ([place]) 05/04/2009   PONV (postoperative nausea and vomiting)    Pre-diabetes    Skin cancer    on nose   Sleep apnea    not using cpap at this time   Vitamin D deficiency    Social History  Socioeconomic History   Marital status: Divorced    Spouse name: Not on file   Number of children: Not on file   Years of education: Not on file   Highest education level: Not on file  Occupational History   Occupation: Nurse    Employer:     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[employer] Use   Smoking status: Former    Packs/day: 1.00    Years: 20.00    Total pack years: 20.00    Types: Cigarettes     Quit date: 2010    Years since quitting: 13.5   Smokeless tobacco: Never  Vaping Use   Vaping Use: Never used  Substance and Sexual Activity   Alcohol use: Not Currently    Comment: Rare    Drug use: No   Sexual activity: Yes    Birth control/protection: Post-menopausal  Other Topics Concern   Not on file  Social History Narrative   Caffeine daily    Social Determinants of Health   Financial Resource Strain: Not on file  Food Insecurity: Not on file  Transportation Needs: Not on file  Physical Activity: Not on file  Stress: Not on file  Social Connections: Not on file  Intimate Partner Violence: Not on file   Past Surgical History:  Procedure Laterality Date   APPENDECTOMY     APPLICATION OF WOUND VAC N/A 02/09/2019   Procedure: APPLICATION OF WOUND VAC;  Surgeon: Clovis Riley, MD;  Location: [place];  Service: General;  Laterality: N/A;   BACK SURGERY     CARPAL TUNNEL RELEASE Right    CARPAL TUNNEL RELEASE Left 08/13/2016   Procedure: LEFT CARPAL [place];  Surgeon: Roseanne Kaufman, MD;  Location: [place];  Service: Orthopedics;  Laterality: Left;   COLECTOMY WITH COLOSTOMY CREATION/HARTMANN PROCEDURE N/A 02/09/2019   Procedure: HARTMANN PROCEDURE;  Surgeon: Clovis Riley, MD;  Location: [place];  Service: General;  Laterality: N/A;   COLONOSCOPY     COLOSTOMY N/A 02/09/2019   Procedure: COLOSTOMY;  Surgeon: Clovis Riley, MD;  Location: [place];  Service: General;  Laterality: N/A;   COLOSTOMY REVERSAL N/A 07/30/2019   Procedure: LAPAROSCOPIC ASSISTED REVERSAL OF END COLOSTOMY WITH RIGID PROCTOSCOPY;  Surgeon: Clovis Riley, MD;  Location: [facility];  Service: General;  Laterality: N/A;   CORONARY STENT PLACEMENT     2 stents in the same artery   ENDOMETRIAL ABLATION  2005   [place] LITHOTRIPSY Right 06/27/2017   Procedure: RIGHT EXTRACORPOREAL SHOCK WAVE LITHOTRIPSY (ESWL);  Surgeon: Franchot Gallo, MD;  Location:  [facility];  Service: Urology;  Laterality: Right;   INCISIONAL HERNIA REPAIR N/A 10/10/2020   Procedure: LAPAROSCOPIC  WITH TRASITION TO OPEN INCISIONAL HERNIA REPAIR WITH MESH;  Surgeon: Clovis Riley, MD;  Location: [facility];  Service: General;  Laterality: N/A;   KNEE ARTHROSCOPY WITH MENISCAL REPAIR Right    MOHS SURGERY     TOTAL HIP ARTHROPLASTY Left 10/26/2021   Procedure: TOTAL HIP ARTHROPLASTY ANTERIOR APPROACH;  Surgeon: Gaynelle Arabian, MD;  Location: [facility];  Service: Orthopedics;  Laterality: Left;   TUBAL LIGATION  1991   Family History  Problem Relation Age of Onset   Hyperlipidemia Mother    Hypertension Mother    Cancer Father        skin   Hyperlipidemia Father    Dementia Father    Arthritis Father    Hypertension Father  Sleep apnea Father    Other Sister        Myelofibrosis   Arthritis Sister    Hyperlipidemia Sister    Arthritis Sister    Hyperlipidemia Sister    Diabetes Paternal Grandfather    Colon cancer Neg Hx     Current Outpatient Medications:    docusate sodium (COLACE) 100 MG capsule, Take 200 mg by mouth in the morning., Disp: , Rfl:    DULoxetine (CYMBALTA) 30 MG capsule, Take 1 capsule (30 mg total) by mouth daily., Disp: 90 capsule, Rfl: 3   hydrochlorothiazide (HYDRODIURIL) 25 MG tablet, Take 1 tablet (25 mg total) by mouth daily., Disp: 90 tablet, Rfl: 2   losartan-hydrochlorothiazide (HYZAAR) 100-25 MG tablet, TAKE 1 TABLET BY MOUTH DAILY., Disp: 90 tablet, Rfl: 0   methocarbamol (ROBAXIN) 500 MG tablet, Take 1 tablet (500 mg total) by mouth every 6 (six) hours as needed for muscle spasms., Disp: 40 tablet, Rfl: 0   metoprolol tartrate (LOPRESSOR) 25 MG tablet, TAKE 1 TABLET BY MOUTH 2 TIMES DAILY, Disp: 180 tablet, Rfl: 0   nitroGLYCERIN (NITROSTAT) 0.4 MG SL tablet, DISSOLVE 1 TABLET UNDER TONGUE EVERY 5 MINUTES UP TO 3 TIMES FOR CHEST PAIN THEN CALL DR IF NO RELIEF, Disp: 25 tablet, Rfl: 3   oxyCODONE (OXY IR/ROXICODONE) 5 MG immediate  release tablet, Take 1 - 2 tablets by mouth every 6 hours as needed for moderate or severe pain., Disp: 42 tablet, Rfl: 0   rosuvastatin (CRESTOR) 20 MG tablet, Take 1 tablet (20 mg total) by mouth 4 (four) times a week. (Patient taking differently: Take 20 mg by mouth every Tuesday, Thursday, Saturday, and Sunday.), Disp: 90 tablet, Rfl: 2   Semaglutide-Weight Management (WEGOVY) 0.5 MG/0.5ML SOAJ, Inject 0.5 mg into the skin once a week., Disp: 2 mL, Rfl: 1   traZODone (DESYREL) 50 MG tablet, TAKE 1 TO 2 TABLETS BY MOUTH AT BEDTIME AS NEEDED FOR SLEEP, Disp: 90 tablet, Rfl: 3   varenicline (CHANTIX) 1 MG tablet, TAKE (1) TABLET TWICE A DAY., Disp: 60 tablet, Rfl: 3  No Known Allergies   ROS: Review of Systems A comprehensive review of systems was negative except for: Musculoskeletal: positive for arthralgias and stiff joints Neurological: positive for memory problems    Physical exam BP 129/70   Pulse 72   Temp (!) 97.2 F (36.2 C)   Ht '5\' 3"'$  (1.6 m)   Wt 190 lb (86.2 kg)   LMP 12/31/2013   SpO2 98%   BMI 33.66 kg/m  General appearance: alert, cooperative, appears stated age, and no distress Head: Normocephalic, without obvious abnormality, atraumatic Eyes: negative findings: lids and lashes normal, conjunctivae and sclerae normal, corneas clear, and pupils equal, round, reactive to light and accomodation Ears: normal TM's and external ear canals both ears Nose: Nares normal. Septum midline. Mucosa normal. No drainage or sinus tenderness. Throat: lips, mucosa, and tongue normal; teeth and gums normal Neck: no adenopathy, no carotid bruit, supple, symmetrical, trachea midline, and thyroid not enlarged, symmetric, no tenderness/mass/nodules Back: symmetric, no curvature. ROM normal. No CVA tenderness. Lungs: clear to auscultation bilaterally Heart:  Very soft systolic murmur noted at the right sternal border.  No radiation to carotids Abdomen: soft, non-tender; bowel sounds normal;  no masses,  no organomegaly Extremities: extremities normal, atraumatic, no cyanosis or edema Pulses: 2+ and symmetric Skin:  Few areas of melasma and hyperpigmentation along the cheeks.  Couple of seborrheic keratosis noted also Lymph nodes: Cervical, supraclavicular, and axillary  nodes normal. Neurologic: Grossly normal Psych: Mood stable, speech normal.  Alert and oriented x3.     02/19/2022    9:50 AM 06/30/2021    4:25 PM 05/12/2021    8:36 AM  Depression screen PHQ 2/9  Decreased Interest 0 0 0  Down, Depressed, Hopeless 0 0 0  PHQ - 2 Score 0 0 0  Altered sleeping  0 0  Tired, decreased energy  0 0  Change in appetite  0 0  Feeling bad or failure about yourself   0 0  Trouble concentrating  0 0  Moving slowly or fidgety/restless  0 0  Suicidal thoughts  0 0  PHQ-9 Score  0 0  Difficult doing work/chores  Not difficult at all Not difficult at all      02/19/2022    9:50 AM 06/30/2021    4:26 PM 05/12/2021    8:36 AM 03/17/2021    8:34 AM  GAD 7 : Generalized Anxiety Score  Nervous, Anxious, on Edge 0 0 0 0  Control/stop worrying 0 0 0 0  Worry too much - different things 0 0 0 0  Trouble relaxing 0 0 0 0  Restless 0 0 0 0  Easily annoyed or irritable 0 0 0 0  Afraid - awful might happen 0 0 0 0  Total GAD 7 Score 0 0 0 0  Anxiety Difficulty Not difficult at all Not difficult at all Not difficult at all     Assessment/ Plan: [name] here for annual physical exam.   Annual physical exam  Need for tetanus booster - Plan: Td : Tetanus/diphtheria >7yo Preservative  free  Mixed hyperlipidemia - Plan: rosuvastatin (CRESTOR) 20 MG tablet  Essential hypertension - Plan: losartan-hydrochlorothiazide (HYZAAR) 100-25 MG tablet, metoprolol tartrate (LOPRESSOR) 25 MG tablet  CAD S/P percutaneous coronary angioplasty - Plan: metoprolol tartrate (LOPRESSOR) 25 MG tablet, rosuvastatin (CRESTOR) 20 MG tablet, Semaglutide-Weight Management (WEGOVY) 1 MG/0.5ML SOAJ,  Semaglutide-Weight Management (WEGOVY) 1.7 MG/0.75ML SOAJ, Semaglutide-Weight Management (WEGOVY) 2.4 MG/0.75ML SOAJ  Newly recognized heart murmur  OSA on CPAP  Obesity (BMI 30-39.9) - Plan: Semaglutide-Weight Management (WEGOVY) 1 MG/0.5ML SOAJ, Semaglutide-Weight Management (WEGOVY) 1.7 MG/0.75ML SOAJ, Semaglutide-Weight Management (WEGOVY) 2.4 MG/0.75ML SOAJ  Memory changes - Plan: Ambulatory referral to Neurology  Family history of Alzheimer's disease - Plan: Ambulatory referral to Neurology  Polyarthritis - Plan: ANA w/Reflex if Positive, Rheumatoid factor, C-reactive protein, Sedimentation Rate, Uric Acid  Acute right-sided low back pain without sciatica - Plan: baclofen (LIORESAL) 10 MG tablet  Tetanus vaccine updated.  I counseled her on shingles vaccination, which she will get done at a different time since we are out of stock on this particular medication.  I have renewed her rosuvastatin for now.  I am reaching out to her cardiologist to discuss possible transition from the statin over to [place].  I suspect that some of the polyarthralgia she has been experiencing is related to her statin.  It seemed to coincide with discontinuation of the Cymbalta which may have been masking these types of symptoms.  Her LDL was at goal yesterday at 88.  Triglycerides still slightly elevated at 257 and HDL was 41.  She has lowered her ASCVD risk or by stopping smoking as well.  Of note I did appreciate a very soft heart murmur on exam at the right sternal border.  I reviewed her 2020 echocardiogram which demonstrated no aortic valve abnormalities.  We will CC her cardiologist on this chart to further  evaluate that as well.  May need to consider updating echocardiogram  Her blood pressure was at goal.  No changes were made.  Continue with weight loss and lifestyle modification.  Mancel Parsons has been advanced to 1 mg weekly then she may proceed with each increase in dose until she achieves 2.4 mg weekly.   Would like to reassess her weight in 3 months to ensure that she continues to reach goals  I have referred her to neurology for cognitive testing.  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  I was not confident that we would get a true reflection of what her memory status is given her familiarity with the test.  She does have a family history of Alzheimer's disease in multiple family members and is very fearful of that diagnosis.  However, because she has experiencing changes in memory I would rather her be tested earlier to find any nuances so that we can immediately start treatment.  With regards to polyarteritis again I think this is likely related to statin use but I will evaluate for any possible rheumatologic etiology.  Labs have been ordered  I have given her as needed baclofen to have on hand for low back pain.  This medication at normal dosing should not cause any QTc issues.  I have given her a handout with home physical therapy exercises that she can try.  If no significant improvement with medication and physical therapy, we will plan for referral for further evaluation  Becket Wecker M. Lajuana Ripple, DO

## 2022-02-20 LAB — SEDIMENTATION RATE: Sed Rate: 11 mm/hr (ref 0–40)

## 2022-02-20 LAB — C-REACTIVE PROTEIN: CRP: 1 mg/L (ref 0–10)

## 2022-02-20 LAB — URIC ACID: Uric Acid: 5.6 mg/dL (ref 3.0–7.2)

## 2022-02-20 LAB — RHEUMATOID FACTOR: Rheumatoid fact SerPl-aCnc: <10 IU/mL (ref <14.0)

## 2022-02-20 LAB — ANA W/REFLEX IF POSITIVE: Anti Nuclear Antibody (ANA): NEGATIVE

## 2022-02-22 ENCOUNTER — Other Ambulatory Visit (HOSPITAL_COMMUNITY): Payer: Self-pay

## 2022-02-23 ENCOUNTER — Telehealth: Payer: Self-pay | Admitting: Pharmacist

## 2022-02-23 ENCOUNTER — Other Ambulatory Visit (HOSPITAL_COMMUNITY): Payer: Self-pay

## 2022-02-23 DIAGNOSIS — I252 Old myocardial infarction: Secondary | ICD-10-CM

## 2022-02-23 DIAGNOSIS — E782 Mixed hyperlipidemia: Secondary | ICD-10-CM

## 2022-02-23 DIAGNOSIS — I2510 Atherosclerotic heart disease of native coronary artery without angina pectoris: Secondary | ICD-10-CM

## 2022-02-23 MED ORDER — REPATHA SURECLICK 140 MG/ML ~~LOC~~ SOAJ
140.0000 mg | SUBCUTANEOUS | 5 refills | Status: DC
  Filled 2022-02-23: qty 2, 28d supply, fill #0
  Filled 2022-03-27: qty 2, 28d supply, fill #1
  Filled 2022-04-26: qty 2, 28d supply, fill #2
  Filled 2022-05-31: qty 2, 28d supply, fill #3
  Filled 2022-07-08 – 2022-07-30 (×3): qty 2, 28d supply, fill #4
  Filled 2022-08-26: qty 2, 28d supply, fill #5

## 2022-02-23 NOTE — Addendum Note (Signed)
Addended by: Lottie Dawson D on: 02/23/2022 02:06 PM   Modules accepted: Orders

## 2022-02-23 NOTE — Telephone Encounter (Signed)
Patient to start on Repatha for HLD, history of MI, atherosclerosis per cards and PCP Will complete PA and await RX to be called in  Copay card given to patient to call in STOP statin due to myopathy

## 2022-02-24 ENCOUNTER — Other Ambulatory Visit (HOSPITAL_COMMUNITY): Payer: Self-pay

## 2022-02-25 ENCOUNTER — Ambulatory Visit: Payer: No Typology Code available for payment source | Admitting: Physician Assistant

## 2022-03-01 NOTE — Progress Notes (Incomplete)
Assessment/Plan:   Heather Lam is a very pleasant 61 y.o. year old RH female with  a history of hypertension, hyperlipidemia, CAD s/p MI 2010, OSA not on CPAP, Vit D deficiency, obesity, insomnia, polyarthralgia, depression seen today for evaluation of memory loss. MoCA today is    Recommendations:   MIld Cognitive Impairment  MRI brain without contrast to assess for underlying structural abnormality and assess vascular load  Neurocognitive testing to further evaluate cognitive concerns and determine other underlying cause of memory changes, including potential contribution from sleep, anxiety, or depression  Check B12, TSH  Folllow up in   months  Subjective:    The patient is seen in neurologic consultation at the request of [name], DO for the evaluation of memory.  The patient is accompanied by  who supplements the history. This is a 61 y.o. year old RH  female who has had memory issues for about   Patient reports that she is been having some intermittent memory changes that have caused her quite a bit of concern.  She has a strong family history of Alzheimer's disease, including her father, whom she watched suffer through complications of advanced Alzheimer's.  This causes her quite a bit of angst.  She is not sure if she wants to know or not know if she has Alzheimer's because she does not want to suffer like her parent did.  She is not currently taking any medications for memory.  She denies getting lost or having difficulty managing finances.  She has not had any problems forgetting people's names or whom she loves but she does admit that sometimes she will just forget what she is doing or what she had just discussed with someone. How long did patient have memory difficulties?  Patient lives with: Spouse who noticed changes as well.  Patient lives alone repeats oneself?  Disoriented when walking into a room?  Patient denies   Leaving objects in unusual places?   Patient denies   Ambulates  with difficulty?   Patient denies   Recent falls?  Patient denies   Any head injuries?  Patient denies   History of seizures?   Patient denies   Wandering behavior?  Patient denies   Patient drives?   Patient no longer drives  Patient drives with assistance  Patient uses [employer] to drive   Any mood changes such irritability agitation?  Patient denies   Any history of depression?:  Patient denies   Hallucinations?  Patient denies   Paranoia?  Patient denies   Patient reports that he sleeps well without vivid dreams, REM behavior or sleepwalking   Patient reports vivid dreams   History of sleep apnea?  Patient denies   Any hygiene concerns?  Patient denies   Independent of bathing and dressing?  Endorsed  Does the patient needs help with medications?  pillbox pill pack  Patient denies   Who is in charge of the finances?  Patient is in charge   is in charge   assists the patient  and denies missing any bills   occasionally misses a payment. Any changes in appetite?  Patient denies   Patient have trouble swallowing? Patient denies   Does the patient cook?  Patient denies   Any kitchen accidents such as leaving the stove on? Patient denies   Any headaches?  Patient denies   The double vision? Patient denies   Any focal numbness or tingling?  Patient denies   Chronic back  pain Patient denies   Unilateral weakness?  Patient denies   Any tremors?  Patient denies   Any history of anosmia?  Patient denies   Any incontinence of urine?  Patient denies   Any bowel dysfunction?   Patient denies     Constipation     diarrhea History of heavy alcohol intake?  Patient denies   History of heavy tobacco use?  Patient denies   Family history of dementia?  Patient denies  Father  Alzheimer's disease    Past Medical History:  Diagnosis Date   Arthritis    Back pain    CAD (coronary artery disease)    Chest pain    Complication of anesthesia    Constipation    Coronary  arteriosclerosis 05/04/2016   Diverticulitis    Diverticulosis    Family history of adverse reaction to anesthesia    Mom PONV   Heart attack ([place]) 2010   History of carpal tunnel syndrome    Bilateral   History of kidney stones    Hyperlipidemia    Hypertension    Intra-abdominal abscess (HCC)    Joint pain    Morbid obesity ([place])    Myocardial infarction ([place]) 05/04/2009   PONV (postoperative nausea and vomiting)    Pre-diabetes    Skin cancer    on nose   Sleep apnea    not using cpap at this time   Vitamin D deficiency      Past Surgical History:  Procedure Laterality Date   APPENDECTOMY     APPLICATION OF WOUND VAC N/A 02/09/2019   Procedure: APPLICATION OF WOUND VAC;  Surgeon: Clovis Riley, MD;  Location: [facility];  Service: General;  Laterality: N/A;   BACK SURGERY     CARPAL TUNNEL RELEASE Right    CARPAL TUNNEL RELEASE Left 08/13/2016   Procedure: LEFT CARPAL TUNNEL RELEASE;  Surgeon: Roseanne Kaufman, MD;  Location: [facility];  Service: Orthopedics;  Laterality: Left;   COLECTOMY WITH COLOSTOMY CREATION/HARTMANN PROCEDURE N/A 02/09/2019   Procedure: HARTMANN PROCEDURE;  Surgeon: Clovis Riley, MD;  Location: [facility];  Service: General;  Laterality: N/A;   COLONOSCOPY     COLOSTOMY N/A 02/09/2019   Procedure: COLOSTOMY;  Surgeon: Clovis Riley, MD;  Location: [facility];  Service: General;  Laterality: N/A;   COLOSTOMY REVERSAL N/A 07/30/2019   Procedure: LAPAROSCOPIC ASSISTED REVERSAL OF END COLOSTOMY WITH RIGID PROCTOSCOPY;  Surgeon: Clovis Riley, MD;  [facility];  Service: General;  Laterality: N/A;   CORONARY STENT PLACEMENT     2 stents in the same artery   ENDOMETRIAL ABLATION  2005   [place] LITHOTRIPSY Right 06/27/2017   Procedure: RIGHT EXTRACORPOREAL SHOCK WAVE LITHOTRIPSY (ESWL);  Surgeon: Franchot Gallo, MD;  [facility];  Service: Urology;  Laterality: Right;   INCISIONAL HERNIA REPAIR N/A 10/10/2020    Procedure: LAPAROSCOPIC  WITH TRASITION TO OPEN INCISIONAL HERNIA REPAIR WITH MESH;  Surgeon: Clovis Riley, MD;  [facility];  Service: General;  Laterality: N/A;   KNEE ARTHROSCOPY WITH MENISCAL REPAIR Right    MOHS SURGERY     TOTAL HIP ARTHROPLASTY Left 10/26/2021   Procedure: TOTAL HIP ARTHROPLASTY ANTERIOR APPROACH;  Surgeon: Gaynelle Arabian, MD;  [facility];  Service: Orthopedics;  Laterality: Left;   TUBAL LIGATION  1991     No Known Allergies  Current Outpatient Medications  Medication Instructions   baclofen (LIORESAL) 10 mg, Oral, 3 times daily  PRN   docusate sodium (COLACE) 200 mg, Oral, Every morning   DULoxetine (CYMBALTA) 30 mg, Oral, Daily   Evolocumab (REPATHA SURECLICK) 010 MG/ML SOAJ Inject 140 mg into the skin every 14 days.   losartan-hydrochlorothiazide (HYZAAR) 100-25 MG tablet TAKE 1 TABLET BY MOUTH DAILY.   metoprolol tartrate (LOPRESSOR) 25 MG tablet TAKE 1 TABLET BY MOUTH 2 TIMES DAILY   nitroGLYCERIN (NITROSTAT) 0.4 MG SL tablet DISSOLVE 1 TABLET UNDER TONGUE EVERY 5 MINUTES UP TO 3 TIMES FOR CHEST PAIN THEN CALL DR IF NO RELIEF   oxyCODONE (OXY IR/ROXICODONE) 5 MG immediate release tablet Take 1 - 2 tablets by mouth every 6 hours as needed for moderate or severe pain.   rosuvastatin (CRESTOR) 20 mg, Oral, Every T-Th-S-Su   Semaglutide-Weight Management (WEGOVY) 2.4 MG/0.75ML SOAJ Inject 2.4 mg into the skin every 7 days.   traZODone (DESYREL) 50 MG tablet TAKE 1 TO 2 TABLETS BY MOUTH AT BEDTIME AS NEEDED FOR SLEEP   Wegovy 1 mg, Subcutaneous, Every 7 days, Month #3   Wegovy 1.7 mg, Subcutaneous, Every 7 days, Month#4     VITALS:  There were no vitals filed for this visit.    02/19/2022    9:50 AM 06/30/2021    4:25 PM 05/12/2021    8:36 AM 03/17/2021    8:34 AM 01/20/2021    1:30 PM  Depression screen PHQ 2/9  Decreased Interest 0 0 0 0 0  Down, Depressed, Hopeless 0 0 0 0 0  PHQ - 2 Score 0 0 0 0 0  Altered sleeping  0 0 0   Tired,  decreased energy  0 0 0   Change in appetite  0 0 0   Feeling bad or failure about yourself   0 0 0   Trouble concentrating  0 0 0   Moving slowly or fidgety/restless  0 0 0   Suicidal thoughts  0 0 0   PHQ-9 Score  0 0 0   Difficult doing work/chores  Not difficult at all Not difficult at all      PHYSICAL EXAM   HEENT:  Normocephalic, atraumatic. The mucous membranes are moist. The superficial temporal arteries are without ropiness or tenderness. Cardiovascular: Regular rate and rhythm. Lungs: Clear to auscultation bilaterally. Neck: There are no carotid bruits noted bilaterally.  NEUROLOGICAL:     No data to display              No data to display           Orientation:  Alert and oriented to person, place and time. No aphasia or dysarthria. Fund of knowledge is appropriate. Recent memory impaired and remote memory intact.  Attention and concentration are normal.  Able to name objects and repeat phrases. Delayed recall   / Cranial nerves: There is good facial symmetry. Extraocular muscles are intact and visual fields are full to confrontational testing. Speech is fluent and clear. Soft palate rises symmetrically and there is no tongue deviation. Hearing is intact to conversational tone. Tone: Tone is good throughout. Sensation: Sensation is intact to light touch and pinprick throughout. Vibration is intact at the bilateral big toe.There is no extinction with double simultaneous stimulation. There is no sensory dermatomal level identified. Coordination: The patient has no difficulty with RAM's or FNF bilaterally. Normal finger to nose  Motor: Strength is 5/5 in the bilateral upper and lower extremities. There is no pronator drift. There are no fasciculations noted. DTR's: Deep tendon reflexes are  2/4 at the bilateral biceps, triceps, brachioradialis, patella and achilles.  Plantar responses are downgoing bilaterally. Gait and Station: The patient is able to ambulate without  difficulty.The patient is able to heel toe walk without any difficulty.The patient is able to ambulate in a tandem fashion. The patient is able to stand in the Romberg position.   Thank you for allowing [place] the opportunity to participate in the care of this nice patient. Please do not hesitate to contact us for any questions or concerns.   Total time spent on today's visit was *** minutes dedicated to this patient today, preparing to see patient, examining the patient, ordering tests and/or medications and counseling the patient, documenting clinical information in the EHR or other health record, independently interpreting results and communicating results to the patient/family, discussing treatment and goals, answering patient's questions and coordinating care.  Cc:  [name], DO  Sharene Butters 03/01/2022 11:33 AM

## 2022-03-02 ENCOUNTER — Encounter: Payer: Self-pay | Admitting: Physician Assistant

## 2022-03-02 ENCOUNTER — Other Ambulatory Visit: Payer: Self-pay

## 2022-03-02 ENCOUNTER — Other Ambulatory Visit: Payer: Self-pay | Admitting: Family Medicine

## 2022-03-02 ENCOUNTER — Ambulatory Visit (INDEPENDENT_AMBULATORY_CARE_PROVIDER_SITE_OTHER): Payer: No Typology Code available for payment source | Admitting: Physician Assistant

## 2022-03-02 ENCOUNTER — Other Ambulatory Visit: Payer: No Typology Code available for payment source

## 2022-03-02 VITALS — BP 132/72 | HR 77 | Resp 18 | Ht 63.0 in | Wt 191.0 lb

## 2022-03-02 DIAGNOSIS — R413 Other amnesia: Secondary | ICD-10-CM

## 2022-03-02 NOTE — Patient Instructions (Addendum)
It was a pleasure to see you today at our office.   Recommendations:  MRI of the brain, the radiology office will call you to arrange you appointment Check labs today Consider psychotherapy Follow up in 1 month  Whom to call:  Memory  decline, memory medications: Call our office [phone]   For psychiatric meds, mood meds: Please have your primary care physician manage these medications.   Counseling regarding caregiver distress, including caregiver depression, anxiety and issues regarding community resources, adult day care programs, adult living facilities, or memory care questions:   Feel free to contact [place], Social Worker at [phone]    For guidance in geriatric dementia issues please call [facility] [phone]  For guidance regarding [facility] and if placement were needed at the facility, contact Arnell Asal, Social Worker tel: [phone]  If you have any severe symptoms of a stroke, or other severe issues such as confusion,severe chills or fever, etc call 911 or go to the ER as you may need to be evaluated further   Feel free to visit Facebook page " [employer]" for tips of how to care for people with memory problems.   Feel free to go to the following database for funded clinical studies conducted around the world: http://saunders.com/   https://www.triadclinicaltrials.com/     RECOMMENDATIONS FOR ALL PATIENTS WITH MEMORY PROBLEMS: 1. Continue to exercise (Recommend 30 minutes of walking everyday, or 3 hours every week) 2. Increase social interactions - continue going to [place] and enjoy social gatherings with friends and family 3. Eat healthy, avoid fried foods and eat more fruits and vegetables 4. Maintain adequate blood pressure, blood sugar, and blood cholesterol level. Reducing the risk of stroke and cardiovascular disease also helps promoting better memory. 5. Avoid stressful situations. Live a  simple life and avoid aggravations. Organize your time and prepare for the next day in anticipation. 6. Sleep well, avoid any interruptions of sleep and avoid any distractions in the bedroom that may interfere with adequate sleep quality 7. Avoid sugar, avoid sweets as there is a strong link between excessive sugar intake, diabetes, and cognitive impairment We discussed the Mediterranean diet, which has been shown to help patients reduce the risk of progressive memory disorders and reduces cardiovascular risk. This includes eating fish, eat fruits and green leafy vegetables, nuts like almonds and hazelnuts, walnuts, and also use olive oil. Avoid fast foods and fried foods as much as possible. Avoid sweets and sugar as sugar use has been linked to worsening of memory function.  There is always a concern of gradual progression of memory problems. If this is the case, then we may need to adjust level of care according to patient needs. Support, both to the patient and caregiver, should then be put into place.      You have been referred for a neuropsychological evaluation (i.e., evaluation of memory and thinking abilities). Please bring someone with you to this appointment if possible, as it is helpful for the doctor to hear from both you and another adult who knows you well. Please bring eyeglasses and hearing aids if you wear them.    The evaluation will take approximately 3 hours and has two parts:   The first part is a clinical interview with the neuropsychologist (Dr. Melvyn Novas or Dr. Nicole Kindred). During the interview, the neuropsychologist will speak with you and the individual you brought to the appointment.    The second part of the evaluation is testing with the  doctor's technician Hinton Dyer or Maudie Mercury). During the testing, the technician will ask you to remember different types of material, solve problems, and answer some questionnaires. Your family member will not be present for this portion of the  evaluation.   Please note: We must reserve several hours of the neuropsychologist's time and the psychometrician's time for your evaluation appointment. As such, there is a No-Show fee of $100. If you are unable to attend any of your appointments, please contact our office as soon as possible to reschedule.    FALL PRECAUTIONS: Be cautious when walking. Scan the area for obstacles that may increase the risk of trips and falls. When getting up in the mornings, sit up at the edge of the bed for a few minutes before getting out of bed. Consider elevating the bed at the head end to avoid drop of blood pressure when getting up. Walk always in a well-lit room (use night lights in the walls). Avoid area rugs or power cords from appliances in the middle of the walkways. Use a walker or a cane if necessary and consider physical therapy for balance exercise. Get your eyesight checked regularly.  FINANCIAL OVERSIGHT: Supervision, especially oversight when making financial decisions or transactions is also recommended.  HOME SAFETY: Consider the safety of the kitchen when operating appliances like stoves, microwave oven, and blender. Consider having supervision and share cooking responsibilities until no longer able to participate in those. Accidents with firearms and other hazards in the house should be identified and addressed as well.   ABILITY TO BE LEFT ALONE: If patient is unable to contact 911 operator, consider using LifeLine, or when the need is there, arrange for someone to stay with patients. Smoking is a fire hazard, consider supervision or cessation. Risk of wandering should be assessed by caregiver and if detected at any point, supervision and safe proof recommendations should be instituted.  MEDICATION SUPERVISION: Inability to self-administer medication needs to be constantly addressed. Implement a mechanism to ensure safe administration of the medications.   DRIVING: Regarding driving, in  patients with progressive memory problems, driving will be impaired. We advise to have someone else do the driving if trouble finding directions or if minor accidents are reported. Independent driving assessment is available to determine safety of driving.   If you are interested in the driving assessment, you can contact the following:  [employer] in [place]  [place] [phone] or [phone]    [place] refers to food and lifestyle choices that are based on the traditions of countries located on the [employer]. This way of eating has been shown to help prevent certain conditions and improve outcomes for people who have chronic diseases, like kidney disease and heart disease. What are tips for following this plan? Lifestyle  Cook and eat meals together with your family, when possible. Drink enough fluid to keep your urine clear or pale yellow. Be physically active every day. This includes: Aerobic exercise like running or swimming. Leisure activities like gardening, walking, or housework. Get 7-8 hours of sleep each night. If recommended by your health care provider, drink red wine in moderation. This means 1 glass a day for nonpregnant women and 2 glasses a day for men. A glass of wine equals 5 oz (150 mL). Reading food labels  Check the serving size of packaged foods. For foods such as rice and pasta, the serving size refers  to the amount of cooked product, not dry. Check the total fat in packaged foods. Avoid foods that have saturated fat or trans fats. Check the ingredients list for added sugars, such as corn syrup. Shopping  At the grocery store, buy most of your food from the areas near the walls of the store. This includes: Fresh fruits and vegetables (produce). Grains, beans, nuts, and seeds. Some of these may be  available in unpackaged forms or large amounts (in bulk). Fresh seafood. Poultry and eggs. Low-fat dairy products. Buy whole ingredients instead of prepackaged foods. Buy fresh fruits and vegetables in-season from local farmers markets. Buy frozen fruits and vegetables in resealable bags. If you do not have access to quality fresh seafood, buy precooked frozen shrimp or canned fish, such as tuna, salmon, or sardines. Buy small amounts of raw or cooked vegetables, salads, or olives from the deli or salad bar at your store. Stock your pantry so you always have certain foods on hand, such as olive oil, canned tuna, canned tomatoes, rice, pasta, and beans. Cooking  Cook foods with extra-virgin olive oil instead of using butter or other vegetable oils. Have meat as a side dish, and have vegetables or grains as your main dish. This means having meat in small portions or adding small amounts of meat to foods like pasta or stew. Use beans or vegetables instead of meat in common dishes like chili or lasagna. Experiment with different cooking methods. Try roasting or broiling vegetables instead of steaming or sauteing them. Add frozen vegetables to soups, stews, pasta, or rice. Add nuts or seeds for added healthy fat at each meal. You can add these to yogurt, salads, or vegetable dishes. Marinate fish or vegetables using olive oil, lemon juice, garlic, and fresh herbs. Meal planning  Plan to eat 1 vegetarian meal one day each week. Try to work up to 2 vegetarian meals, if possible. Eat seafood 2 or more times a week. Have healthy snacks readily available, such as: Vegetable sticks with hummus. Greek yogurt. Fruit and nut trail mix. Eat balanced meals throughout the week. This includes: Fruit: 2-3 servings a day Vegetables: 4-5 servings a day Low-fat dairy: 2 servings a day Fish, poultry, or lean meat: 1 serving a day Beans and legumes: 2 or more servings a week Nuts and seeds: 1-2 servings a  day Whole grains: 6-8 servings a day Extra-virgin olive oil: 3-4 servings a day Limit red meat and sweets to only a few servings a month What are my food choices? Mediterranean diet Recommended Grains: Whole-grain pasta. Brown rice. Bulgar wheat. Polenta. Couscous. Whole-wheat bread. [place]. Vegetables: Artichokes. Beets. Broccoli. Cabbage. Carrots. Eggplant. Green beans. Chard. Kale. Spinach. Onions. Leeks. Peas. Squash. Tomatoes. Peppers. Radishes. Fruits: Apples. Apricots. Avocado. Berries. Bananas. Cherries. Dates. Figs. Grapes. Lemons. Melon. Oranges. Peaches. Plums. Pomegranate. Meats and other protein foods: Beans. Almonds. Sunflower seeds. Pine nuts. Peanuts. [place]. Salmon. Scallops. Shrimp. [place]. Tilapia. Clams. Oysters. Eggs. Dairy: Low-fat milk. Cheese. Greek yogurt. Beverages: Water. Red wine. Herbal tea. Fats and oils: Extra virgin olive oil. Avocado oil. Grape seed oil. Sweets and desserts: [place] yogurt with honey. Baked apples. Poached pears. Trail mix. Seasoning and other foods: Basil. Cilantro. Coriander. Cumin. Mint. Parsley. Sage. Rosemary. Tarragon. Garlic. Oregano. Thyme. Pepper. Balsalmic vinegar. Tahini. Hummus. Tomato sauce. Olives. Mushrooms. Limit these Grains: Prepackaged pasta or rice dishes. Prepackaged cereal with added sugar. Vegetables: Deep fried potatoes (french fries). Fruits: Fruit canned in syrup. Meats and other protein foods: Beef. Pork. Lamb. Poultry  with skin. Hot dogs. Berniece Salines. Dairy: Ice cream. Sour cream. Whole milk. Beverages: Juice. Sugar-sweetened soft drinks. Beer. Liquor and spirits. Fats and oils: Butter. Canola oil. Vegetable oil. Beef fat (tallow). Lard. Sweets and desserts: Cookies. Cakes. Pies. Candy. Seasoning and other foods: Mayonnaise. Premade sauces and marinades. The items listed may not be a complete list. Talk with your dietitian about what dietary choices are right for you. Summary The Mediterranean diet includes both  food and lifestyle choices. Eat a variety of fresh fruits and vegetables, beans, nuts, seeds, and whole grains. Limit the amount of red meat and sweets that you eat. Talk with your health care provider about whether it is safe for you to drink red wine in moderation. This means 1 glass a day for nonpregnant women and 2 glasses a day for men. A glass of wine equals 5 oz (150 mL). This information is not intended to replace advice given to you by your health care provider. Make sure you discuss any questions you have with your health care provider. Document Released: 03/11/2016 Document Revised: 04/13/2016 Document Reviewed: 03/11/2016 Elsevier Interactive Patient Education  2017 [place] provider has requested that you have labwork completed today. Please go to [facility] ([id number]) on the second floor of this building before leaving the office today. We have sent a referral to [facility] for your MRI and they will call you directly to schedule your appointment. They are located at [place]. If you need to contact them directly please call [phone].

## 2022-03-02 NOTE — Addendum Note (Signed)
Addended by: Guerry Bruin on: 03/02/2022 02:05 PM   Modules accepted: Orders

## 2022-03-02 NOTE — Addendum Note (Signed)
Addended by: Guerry Bruin on: 03/02/2022 02:06 PM   Modules accepted: Orders

## 2022-03-03 LAB — TSH: TSH: 1.37 u[IU]/mL (ref 0.450–4.500)

## 2022-03-03 NOTE — Progress Notes (Signed)
Please inform the patient that her thyroid levels are normal at 1.370, thank

## 2022-03-12 ENCOUNTER — Ambulatory Visit (INDEPENDENT_AMBULATORY_CARE_PROVIDER_SITE_OTHER): Payer: No Typology Code available for payment source | Admitting: Family Medicine

## 2022-03-12 ENCOUNTER — Encounter: Payer: Self-pay | Admitting: Family Medicine

## 2022-03-12 DIAGNOSIS — M65331 Trigger finger, right middle finger: Secondary | ICD-10-CM

## 2022-03-12 MED ORDER — PREDNISONE 20 MG PO TABS: ORAL_TABLET | ORAL | 0 refills | Status: DC

## 2022-03-12 NOTE — Progress Notes (Signed)
Virtual Visit via telephone Note  I connected with Heather Lam on 03/12/22 at 1504 by telephone and verified that I am speaking with the correct person using two identifiers. Heather Lam is currently located at work and patient are currently with her during visit. The provider, Fransisca Kaufmann Ernestyne Caldwell, MD is located in their office at time of visit.  Call ended at 1510  I discussed the limitations, risks, security and privacy concerns of performing an evaluation and management service by telephone and the availability of in person appointments. I also discussed with the patient that there may be a patient responsible charge related to this service. The patient expressed understanding and agreed to proceed.   History and Present Illness: Patient is coming in with continued swelling and pain in her hands and wrists and especially in her hands she has been getting more trigger finger, the right hand is worse than the left and she says is just not improving.  She has been trying to do ice and anti-inflammatories and things to help with it but is just not improving.  She would like to try some steroids to see if it can help.  1. Trigger middle finger of right hand     Outpatient Encounter Medications as of 03/12/2022  Medication Sig   predniSONE (DELTASONE) 20 MG tablet 2 po at same time daily for 5 days   baclofen (LIORESAL) 10 MG tablet Take 1 tablet (10 mg total) by mouth 3 (three) times daily as needed for muscle spasms.   docusate sodium (COLACE) 100 MG capsule Take 200 mg by mouth in the morning.   DULoxetine (CYMBALTA) 30 MG capsule Take 1 capsule (30 mg total) by mouth daily.   Evolocumab (REPATHA SURECLICK) 417 MG/ML SOAJ Inject 140 mg into the skin every 14 days.   losartan-hydrochlorothiazide (HYZAAR) 100-25 MG tablet TAKE 1 TABLET BY MOUTH DAILY.   metoprolol tartrate (LOPRESSOR) 25 MG tablet TAKE 1 TABLET BY MOUTH 2 TIMES DAILY   nitroGLYCERIN (NITROSTAT) 0.4 MG SL tablet  DISSOLVE 1 TABLET UNDER TONGUE EVERY 5 MINUTES UP TO 3 TIMES FOR CHEST PAIN THEN CALL DR IF NO RELIEF (Patient not taking: Reported on 03/02/2022)   oxyCODONE (OXY IR/ROXICODONE) 5 MG immediate release tablet Take 1 - 2 tablets by mouth every 6 hours as needed for moderate or severe pain.   rosuvastatin (CRESTOR) 20 MG tablet Take 1 tablet (20 mg total) by mouth every Tuesday, Thursday, Saturday, and Sunday.   Semaglutide-Weight Management (WEGOVY) 1 MG/0.5ML SOAJ Inject 1 mg into the skin every 7 (seven) days. Month #3   Semaglutide-Weight Management (WEGOVY) 1.7 MG/0.75ML SOAJ Inject 1.7 mg into the skin every 7 (seven) days. Month#4 (Patient not taking: Reported on 03/02/2022)   Semaglutide-Weight Management (WEGOVY) 2.4 MG/0.75ML SOAJ Inject 2.4 mg into the skin every 7 days. (Patient not taking: Reported on 03/02/2022)   traZODone (DESYREL) 50 MG tablet TAKE 1 TO 2 TABLETS BY MOUTH AT BEDTIME AS NEEDED FOR SLEEP   [DISCONTINUED] gabapentin (NEURONTIN) 300 MG capsule Take 1 capsule (300 mg total) by mouth every 8 (eight) hours as needed.   No facility-administered encounter medications on file as of 03/12/2022.    Review of Systems  Eyes:  Negative for visual disturbance.  Respiratory:  Negative for chest tightness and shortness of breath.   Cardiovascular:  Negative for chest pain and leg swelling.  Musculoskeletal:  Positive for arthralgias and joint swelling. Negative for back pain and gait problem.  Skin:  Negative for color change and rash.  Psychiatric/Behavioral:  Negative for agitation and behavioral problems.   All other systems reviewed and are negative.   Observations/Objective: Patient sounds comfortable and in no acute distress  Assessment and Plan: Problem List Items Addressed This Visit   None Visit Diagnoses     Trigger middle finger of right hand    -  Primary   Relevant Medications   predniSONE (DELTASONE) 20 MG tablet     We will give short course of prednisone to  see if it helps, discussed trigger finger injection and patient wants to hold off on that for now.  Follow up plan: Return if symptoms worsen or fail to improve.     I discussed the assessment and treatment plan with the patient. The patient was provided an opportunity to ask questions and all were answered. The patient agreed with the plan and demonstrated an understanding of the instructions.   The patient was advised to call back or seek an in-person evaluation if the symptoms worsen or if the condition fails to improve as anticipated.  The above assessment and management plan was discussed with the patient. The patient verbalized understanding of and has agreed to the management plan. Patient is aware to call the clinic if symptoms persist or worsen. Patient is aware when to return to the clinic for a follow-up visit. Patient educated on when it is appropriate to go to the emergency department.    I provided 6 minutes of non-face-to-face time during this encounter.    Worthy Rancher, MD

## 2022-03-15 ENCOUNTER — Other Ambulatory Visit (HOSPITAL_COMMUNITY): Payer: Self-pay

## 2022-03-16 ENCOUNTER — Other Ambulatory Visit (HOSPITAL_COMMUNITY): Payer: Self-pay

## 2022-03-17 ENCOUNTER — Encounter: Payer: No Typology Code available for payment source | Admitting: Family Medicine

## 2022-03-23 ENCOUNTER — Other Ambulatory Visit (HOSPITAL_COMMUNITY): Payer: Self-pay

## 2022-03-23 ENCOUNTER — Encounter: Payer: Self-pay | Admitting: Family Medicine

## 2022-03-25 ENCOUNTER — Ambulatory Visit
Admission: RE | Admit: 2022-03-25 | Discharge: 2022-03-25 | Disposition: A | Discharge status: Home / Self Care | Payer: No Typology Code available for payment source | Source: Ambulatory Visit | Attending: Physician Assistant | Admitting: Physician Assistant

## 2022-03-25 NOTE — Progress Notes (Signed)
MRI of the brain does not show any acute findings.  There are some changes of chronic small vessel ischemic disease, needs to monitor her cardiovascular risk factors including blood pressure, cholesterol and sugars closely.  No atrophy in the size or shape of the brain.  Thank you

## 2022-03-29 ENCOUNTER — Other Ambulatory Visit (HOSPITAL_COMMUNITY): Payer: Self-pay

## 2022-04-01 ENCOUNTER — Ambulatory Visit: Payer: No Typology Code available for payment source | Admitting: Physician Assistant

## 2022-04-01 ENCOUNTER — Encounter: Payer: Self-pay | Admitting: Physician Assistant

## 2022-04-01 VITALS — BP 130/83 | HR 78 | Resp 18 | Ht 63.0 in

## 2022-04-01 DIAGNOSIS — R413 Other amnesia: Secondary | ICD-10-CM | POA: Diagnosis not present

## 2022-04-01 NOTE — Progress Notes (Signed)
Assessment/Plan:   Memory difficulties  Heather Lam is a very pleasant 61 y.o. RH female  with  a history of hypertension, hyperlipidemia, CAD s/p MI 2010, OSA not on CPAP, Vit D deficiency, obesity, insomnia, polyarthralgia, depression seen today in follow up to discuss MRI results, remarkable for mild chronic small vessel ischemic disease, without age advanced or lobar predominant parenchymal atrophy.  There are a couple of areas of abnormality in the brain MRI, and the radiology was concerned of possible MS but patient is asymptomatic is asymptomatic.  She denies any loss of balance or dizziness, spasms, tremors, bladder or bowel problems, or double vision.  Last MoCA on 03/02/2022 was 29/30, with delayed recall 5 out of 5.There is low suspicion for dementia at this time so no medication is indicated at this moment..    Follow up in 1 year consider psychotherapy for situational depression Repeat MRI of the brain in 1 year prior to the appointment to follow-up on the abnormality seen on the initial MRI of the brain, rule out the possibility of MS. Continue good control of cardiovascular risk factors, including baby aspirin daily   Case discussed with Dr. Delice Lesch who agrees with the plan    Subjective:    This patient is accompanied in the office by  who supplements the history.  Previous records as well as any outside records available were reviewed prior to todays visit.    Any changes in memory since last visit?  Patient denies any changes, although she is starting to become more active in doing crossword puzzles and sudoku.  She is also trying to control her level of stress.  She is considering psychotherapy.  She is trying to wean off of Cymbalta and trying to change her lifestyle.   MEDICATIONS:  Outpatient Encounter Medications as of 04/01/2022  Medication Sig   baclofen (LIORESAL) 10 MG tablet Take 1 tablet (10 mg total) by mouth 3 (three) times daily as needed for muscle  spasms.   docusate sodium (COLACE) 100 MG capsule Take 200 mg by mouth in the morning.   DULoxetine (CYMBALTA) 30 MG capsule Take 1 capsule (30 mg total) by mouth daily.   Evolocumab (REPATHA SURECLICK) 101 MG/ML SOAJ Inject 140 mg into the skin every 14 days.   losartan-hydrochlorothiazide (HYZAAR) 100-25 MG tablet TAKE 1 TABLET BY MOUTH DAILY.   metoprolol tartrate (LOPRESSOR) 25 MG tablet TAKE 1 TABLET BY MOUTH 2 TIMES DAILY   nitroGLYCERIN (NITROSTAT) 0.4 MG SL tablet DISSOLVE 1 TABLET UNDER TONGUE EVERY 5 MINUTES UP TO 3 TIMES FOR CHEST PAIN THEN CALL DR IF NO RELIEF (Patient not taking: Reported on 03/02/2022)   oxyCODONE (OXY IR/ROXICODONE) 5 MG immediate release tablet Take 1 - 2 tablets by mouth every 6 hours as needed for moderate or severe pain.   predniSONE (DELTASONE) 20 MG tablet 2 po at same time daily for 5 days   rosuvastatin (CRESTOR) 20 MG tablet Take 1 tablet (20 mg total) by mouth every Tuesday, Thursday, Saturday, and Sunday.   Semaglutide-Weight Management (WEGOVY) 1 MG/0.5ML SOAJ Inject 1 mg into the skin every 7 (seven) days. Month #3   Semaglutide-Weight Management (WEGOVY) 1.7 MG/0.75ML SOAJ Inject 1.7 mg into the skin every 7 (seven) days. Month#4 (Patient not taking: Reported on 03/02/2022)   Semaglutide-Weight Management (WEGOVY) 2.4 MG/0.75ML SOAJ Inject 2.4 mg into the skin every 7 days. (Patient not taking: Reported on 03/02/2022)   traZODone (DESYREL) 50 MG tablet TAKE 1 TO 2 TABLETS BY  MOUTH AT BEDTIME AS NEEDED FOR SLEEP   [DISCONTINUED] gabapentin (NEURONTIN) 300 MG capsule Take 1 capsule (300 mg total) by mouth every 8 (eight) hours as needed.   No facility-administered encounter medications on file as of 04/01/2022.        No data to display            03/02/2022    8:00 AM  Montreal Cognitive Assessment   Visuospatial/ Executive (0/5) 4  Naming (0/3) 3  Attention: Read list of digits (0/2) 2  Attention: Read list of letters (0/1) 1  Attention: Serial 7  subtraction starting at 100 (0/3) 3  Language: Repeat phrase (0/2) 2  Language : Fluency (0/1) 1  Abstraction (0/2) 2  Delayed Recall (0/5) 5  Orientation (0/6) 6  Total 29  Adjusted Score (based on education) 29    Objective:     PHYSICAL EXAMINATION:    VITALS:   Vitals:   04/01/22 0917  BP: 130/83  Pulse: 78  Resp: 18  SpO2: 96%  Height: '5\' 3"'$  (1.6 m)    GEN:  The patient appears stated age and is in NAD. HEENT:  Normocephalic, atraumatic.   Neurological examination:  General: NAD, well-groomed, appears stated age. Orientation: The patient is alert. Oriented to person, place and date Cranial nerves: There is good facial symmetry.The speech is fluent and clear. No aphasia or dysarthria. Fund of knowledge is appropriate. Recent and remote memory are normal. Attention and concentration are normal  Able to name objects and repeat phrases.  Hearing is intact to conversational tone.    Sensation: Sensation is intact to light touch throughout Motor: Strength is at least antigravity x4. Tremors: none  DTR's 2/4 in UE/LE     Movement examination: Tone: There is normal tone in the UE/LE Abnormal movements:  no tremor.  No myoclonus.  No asterixis.   Coordination:  There is no decremation with RAM's. Normal finger to nose  Gait and Station: The patient has no difficulty arising out of a deep-seated chair without the use of the hands. The patient's stride length is good.  Gait is cautious and narrow.    Thank you for allowing [place] the opportunity to participate in the care of this nice patient. Please do not hesitate to contact us for any questions or concerns.   Total time spent on today's visit was 32 minutes dedicated to this patient today, preparing to see patient, examining the patient, ordering tests and/or medications and counseling the patient, documenting clinical information in the EHR or other health record, independently interpreting results and communicating results  to the patient/family, discussing treatment and goals, answering patient's questions and coordinating care.  Cc:  [name], DO  Sharene Butters 04/01/2022 11:30 AM

## 2022-04-01 NOTE — Patient Instructions (Signed)
It was a pleasure to see you today at our office.   Recommendations:  MRI of the brain in 1 year prior to the visit, the radiology office will call you to arrange you appointment Consider psychotherapy Control cardiovascular risk factors Follow up in 1 year   Whom to call:  Memory  decline, memory medications: Call our office [phone]   For psychiatric meds, mood meds: Please have your primary care physician manage these medications.   Counseling regarding caregiver distress, including caregiver depression, anxiety and issues regarding community resources, adult day care programs, adult living facilities, or memory care questions:   Feel free to contact [place], Social Worker at [phone]    For guidance in geriatric dementia issues please call [place] [phone]  For guidance regarding [facility] and if placement were needed at the facility, contact Arnell Asal, Social Worker tel: [phone]  If you have any severe symptoms of a stroke, or other severe issues such as confusion,severe chills or fever, etc call 911 or go to the ER as you may need to be evaluated further   Feel free to visit Facebook page " [employer]" for tips of how to care for people with memory problems.   Feel free to go to the following database for funded clinical studies conducted around the world: http://saunders.com/   https://www.triadclinicaltrials.com/     RECOMMENDATIONS FOR ALL PATIENTS WITH MEMORY PROBLEMS: 1. Continue to exercise (Recommend 30 minutes of walking everyday, or 3 hours every week) 2. Increase social interactions - continue going to [place] and enjoy social gatherings with friends and family 3. Eat healthy, avoid fried foods and eat more fruits and vegetables 4. Maintain adequate blood pressure, blood sugar, and blood cholesterol level. Reducing the risk of stroke and cardiovascular disease also helps promoting better  memory. 5. Avoid stressful situations. Live a simple life and avoid aggravations. Organize your time and prepare for the next day in anticipation. 6. Sleep well, avoid any interruptions of sleep and avoid any distractions in the bedroom that may interfere with adequate sleep quality 7. Avoid sugar, avoid sweets as there is a strong link between excessive sugar intake, diabetes, and cognitive impairment We discussed the Mediterranean diet, which has been shown to help patients reduce the risk of progressive memory disorders and reduces cardiovascular risk. This includes eating fish, eat fruits and green leafy vegetables, nuts like almonds and hazelnuts, walnuts, and also use olive oil. Avoid fast foods and fried foods as much as possible. Avoid sweets and sugar as sugar use has been linked to worsening of memory function.  There is always a concern of gradual progression of memory problems. If this is the case, then we may need to adjust level of care according to patient needs. Support, both to the patient and caregiver, should then be put into place.      You have been referred for a neuropsychological evaluation (i.e., evaluation of memory and thinking abilities). Please bring someone with you to this appointment if possible, as it is helpful for the doctor to hear from both you and another adult who knows you well. Please bring eyeglasses and hearing aids if you wear them.    The evaluation will take approximately 3 hours and has two parts:   The first part is a clinical interview with the neuropsychologist (Dr. Melvyn Novas or Dr. Nicole Kindred). During the interview, the neuropsychologist will speak with you and the individual you brought to the appointment.    The  second part of the evaluation is testing with the doctor's technician Hinton Dyer or Maudie Mercury). During the testing, the technician will ask you to remember different types of material, solve problems, and answer some questionnaires. Your family member will  not be present for this portion of the evaluation.   Please note: We must reserve several hours of the neuropsychologist's time and the psychometrician's time for your evaluation appointment. As such, there is a No-Show fee of $100. If you are unable to attend any of your appointments, please contact our office as soon as possible to reschedule.    FALL PRECAUTIONS: Be cautious when walking. Scan the area for obstacles that may increase the risk of trips and falls. When getting up in the mornings, sit up at the edge of the bed for a few minutes before getting out of bed. Consider elevating the bed at the head end to avoid drop of blood pressure when getting up. Walk always in a well-lit room (use night lights in the walls). Avoid area rugs or power cords from appliances in the middle of the walkways. Use a walker or a cane if necessary and consider physical therapy for balance exercise. Get your eyesight checked regularly.  FINANCIAL OVERSIGHT: Supervision, especially oversight when making financial decisions or transactions is also recommended.  HOME SAFETY: Consider the safety of the kitchen when operating appliances like stoves, microwave oven, and blender. Consider having supervision and share cooking responsibilities until no longer able to participate in those. Accidents with firearms and other hazards in the house should be identified and addressed as well.   ABILITY TO BE LEFT ALONE: If patient is unable to contact 911 operator, consider using LifeLine, or when the need is there, arrange for someone to stay with patients. Smoking is a fire hazard, consider supervision or cessation. Risk of wandering should be assessed by caregiver and if detected at any point, supervision and safe proof recommendations should be instituted.  MEDICATION SUPERVISION: Inability to self-administer medication needs to be constantly addressed. Implement a mechanism to ensure safe administration of the  medications.   DRIVING: Regarding driving, in patients with progressive memory problems, driving will be impaired. We advise to have someone else do the driving if trouble finding directions or if minor accidents are reported. Independent driving assessment is available to determine safety of driving.   If you are interested in the driving assessment, you can contact the following:  [employer] in [place]  [place] [phone] or [phone]    [place] refers to food and lifestyle choices that are based on the traditions of countries located on the [employer]. This way of eating has been shown to help prevent certain conditions and improve outcomes for people who have chronic diseases, like kidney disease and heart disease. What are tips for following this plan? Lifestyle  Cook and eat meals together with your family, when possible. Drink enough fluid to keep your urine clear or pale yellow. Be physically active every day. This includes: Aerobic exercise like running or swimming. Leisure activities like gardening, walking, or housework. Get 7-8 hours of sleep each night. If recommended by your health care provider, drink red wine in moderation. This means 1 glass a day for nonpregnant women and 2 glasses a day for men. A glass of wine equals 5 oz (150 mL). Reading food labels  Check the serving size of packaged foods. For foods  such as rice and pasta, the serving size refers to the amount of cooked product, not dry. Check the total fat in packaged foods. Avoid foods that have saturated fat or trans fats. Check the ingredients list for added sugars, such as corn syrup. Shopping  At the grocery store, buy most of your food from the areas near the walls of the store. This includes: Fresh fruits and vegetables (produce). Grains,  beans, nuts, and seeds. Some of these may be available in unpackaged forms or large amounts (in bulk). Fresh seafood. Poultry and eggs. Low-fat dairy products. Buy whole ingredients instead of prepackaged foods. Buy fresh fruits and vegetables in-season from local farmers markets. Buy frozen fruits and vegetables in resealable bags. If you do not have access to quality fresh seafood, buy precooked frozen shrimp or canned fish, such as tuna, salmon, or sardines. Buy small amounts of raw or cooked vegetables, salads, or olives from the deli or salad bar at your store. Stock your pantry so you always have certain foods on hand, such as olive oil, canned tuna, canned tomatoes, rice, pasta, and beans. Cooking  Cook foods with extra-virgin olive oil instead of using butter or other vegetable oils. Have meat as a side dish, and have vegetables or grains as your main dish. This means having meat in small portions or adding small amounts of meat to foods like pasta or stew. Use beans or vegetables instead of meat in common dishes like chili or lasagna. Experiment with different cooking methods. Try roasting or broiling vegetables instead of steaming or sauteing them. Add frozen vegetables to soups, stews, pasta, or rice. Add nuts or seeds for added healthy fat at each meal. You can add these to yogurt, salads, or vegetable dishes. Marinate fish or vegetables using olive oil, lemon juice, garlic, and fresh herbs. Meal planning  Plan to eat 1 vegetarian meal one day each week. Try to work up to 2 vegetarian meals, if possible. Eat seafood 2 or more times a week. Have healthy snacks readily available, such as: Vegetable sticks with hummus. Greek yogurt. Fruit and nut trail mix. Eat balanced meals throughout the week. This includes: Fruit: 2-3 servings a day Vegetables: 4-5 servings a day Low-fat dairy: 2 servings a day Fish, poultry, or lean meat: 1 serving a day Beans and legumes: 2 or more  servings a week Nuts and seeds: 1-2 servings a day Whole grains: 6-8 servings a day Extra-virgin olive oil: 3-4 servings a day Limit red meat and sweets to only a few servings a month What are my food choices? Mediterranean diet Recommended Grains: Whole-grain pasta. Brown rice. Bulgar wheat. Polenta. Couscous. Whole-wheat bread. [place]. Vegetables: Artichokes. Beets. Broccoli. Cabbage. Carrots. Eggplant. Green beans. Chard. Kale. Spinach. Onions. Leeks. Peas. Squash. Tomatoes. Peppers. Radishes. Fruits: Apples. Apricots. Avocado. Berries. Bananas. Cherries. Dates. Figs. Grapes. Lemons. Melon. Oranges. Peaches. Plums. Pomegranate. Meats and other protein foods: Beans. Almonds. Sunflower seeds. Pine nuts. Peanuts. [place]. Salmon. Scallops. Shrimp. [place]. Tilapia. Clams. Oysters. Eggs. Dairy: Low-fat milk. Cheese. Greek yogurt. Beverages: Water. Red wine. Herbal tea. Fats and oils: Extra virgin olive oil. Avocado oil. Grape seed oil. Sweets and desserts: [place] yogurt with honey. Baked apples. Poached pears. Trail mix. Seasoning and other foods: Basil. Cilantro. Coriander. Cumin. Mint. Parsley. Sage. Rosemary. Tarragon. Garlic. Oregano. Thyme. Pepper. Balsalmic vinegar. Tahini. Hummus. Tomato sauce. Olives. Mushrooms. Limit these Grains: Prepackaged pasta or rice dishes. Prepackaged cereal with added sugar. Vegetables: Deep fried potatoes (french fries). Fruits: Fruit canned in syrup.  Meats and other protein foods: Beef. Pork. Lamb. Poultry with skin. Hot dogs. [place] Salines. Dairy: Ice cream. Sour cream. Whole milk. Beverages: Juice. Sugar-sweetened soft drinks. Beer. Liquor and spirits. Fats and oils: Butter. Canola oil. Vegetable oil. Beef fat (tallow). Lard. Sweets and desserts: Cookies. Cakes. Pies. Candy. Seasoning and other foods: Mayonnaise. Premade sauces and marinades. The items listed may not be a complete list. Talk with your dietitian about what dietary choices are right for  you. Summary The Mediterranean diet includes both food and lifestyle choices. Eat a variety of fresh fruits and vegetables, beans, nuts, seeds, and whole grains. Limit the amount of red meat and sweets that you eat. Talk with your health care provider about whether it is safe for you to drink red wine in moderation. This means 1 glass a day for nonpregnant women and 2 glasses a day for men. A glass of wine equals 5 oz (150 mL). This information is not intended to replace advice given to you by your health care provider. Make sure you discuss any questions you have with your health care provider. Document Released: 03/11/2016 Document Revised: 04/13/2016 Document Reviewed: 03/11/2016 Elsevier Interactive Patient Education  2017 [place] provider has requested that you have labwork completed today. Please go to [facility] ([id number]) on the second floor of this building before leaving the office today. We have sent a referral to [facility] for your MRI and they will call you directly to schedule your appointment. They are located at [place]. If you need to contact them directly please call [phone].

## 2022-04-06 ENCOUNTER — Other Ambulatory Visit (HOSPITAL_COMMUNITY): Payer: Self-pay

## 2022-04-08 ENCOUNTER — Telehealth (INDEPENDENT_AMBULATORY_CARE_PROVIDER_SITE_OTHER): Payer: No Typology Code available for payment source | Admitting: Family Medicine

## 2022-04-08 ENCOUNTER — Encounter: Payer: Self-pay | Admitting: Family Medicine

## 2022-04-08 DIAGNOSIS — R197 Diarrhea, unspecified: Secondary | ICD-10-CM

## 2022-04-08 DIAGNOSIS — K529 Noninfective gastroenteritis and colitis, unspecified: Secondary | ICD-10-CM | POA: Diagnosis not present

## 2022-04-08 MED ORDER — AZITHROMYCIN 500 MG PO TABS: 500.0000 mg | ORAL_TABLET | Freq: Every day | ORAL | 0 refills | Status: AC

## 2022-04-08 NOTE — Progress Notes (Signed)
Virtual Visit via Video note  I connected with Heather Lam on 04/08/22 at 12:04 PM by video and verified that I am speaking with the correct person using two identifiers. Heather Lam is currently located at home and nobody is currently with her during visit. The provider, Loman Brooklyn, FNP is located in their office at time of visit.  I discussed the limitations, risks, security and privacy concerns of performing an evaluation and management service by video and the availability of in person appointments. I also discussed with the patient that there may be a patient responsible charge related to this service. The patient expressed understanding and agreed to proceed.  Subjective: PCP: [name], DO  Chief Complaint  Patient presents with   GI Problem   Patient reports she has had diarrhea every day for the past four days.  She describes it as pure liquid.  It does not smell of C. difficile.  She has no appetite, but is making sure she is drinking plenty of fluids.  However her fluids have been tea.  She is feeling very weak at this point.  She has taken the max dose of Imodium A-D every day for the past 3 days, which does slow down the diarrhea to 6-8 times per day. Denies abdominal pain and fever.   ROS: Per HPI  Current Outpatient Medications:    baclofen (LIORESAL) 10 MG tablet, Take 1 tablet (10 mg total) by mouth 3 (three) times daily as needed for muscle spasms., Disp: 90 each, Rfl: 0   docusate sodium (COLACE) 100 MG capsule, Take 200 mg by mouth in the morning., Disp: , Rfl:    DULoxetine (CYMBALTA) 30 MG capsule, Take 1 capsule (30 mg total) by mouth daily., Disp: 90 capsule, Rfl: 3   Evolocumab (REPATHA SURECLICK) 956 MG/ML SOAJ, Inject 140 mg into the skin every 14 days., Disp: 2 mL, Rfl: 5   losartan-hydrochlorothiazide (HYZAAR) 100-25 MG tablet, TAKE 1 TABLET BY MOUTH DAILY., Disp: 90 tablet, Rfl: 3   metoprolol tartrate (LOPRESSOR) 25 MG tablet, TAKE 1  TABLET BY MOUTH 2 TIMES DAILY, Disp: 180 tablet, Rfl: 3   nitroGLYCERIN (NITROSTAT) 0.4 MG SL tablet, DISSOLVE 1 TABLET UNDER TONGUE EVERY 5 MINUTES UP TO 3 TIMES FOR CHEST PAIN THEN CALL DR IF NO RELIEF (Patient not taking: Reported on 03/02/2022), Disp: 25 tablet, Rfl: 3   oxyCODONE (OXY IR/ROXICODONE) 5 MG immediate release tablet, Take 1 - 2 tablets by mouth every 6 hours as needed for moderate or severe pain., Disp: 42 tablet, Rfl: 0   predniSONE (DELTASONE) 20 MG tablet, 2 po at same time daily for 5 days, Disp: 10 tablet, Rfl: 0   rosuvastatin (CRESTOR) 20 MG tablet, Take 1 tablet (20 mg total) by mouth every Tuesday, Thursday, Saturday, and Sunday., Disp: 90 tablet, Rfl: PRN   Semaglutide-Weight Management (WEGOVY) 1 MG/0.5ML SOAJ, Inject 1 mg into the skin every 7 (seven) days. Month #3, Disp: 2 mL, Rfl: 0   Semaglutide-Weight Management (WEGOVY) 1.7 MG/0.75ML SOAJ, Inject 1.7 mg into the skin every 7 (seven) days. Month#4 (Patient not taking: Reported on 03/02/2022), Disp: 3 mL, Rfl: 0   Semaglutide-Weight Management (WEGOVY) 2.4 MG/0.75ML SOAJ, Inject 2.4 mg into the skin every 7 days. (Patient not taking: Reported on 03/02/2022), Disp: 9 mL, Rfl: 3   traZODone (DESYREL) 50 MG tablet, TAKE 1 TO 2 TABLETS BY MOUTH AT BEDTIME AS NEEDED FOR SLEEP, Disp: 90 tablet, Rfl: 3  Allergies  Allergen Reactions   Ciprofloxacin Itching   Past Medical History:  Diagnosis Date   Arthritis    Back pain    CAD (coronary artery disease)    Chest pain    Complication of anesthesia    Constipation    Coronary arteriosclerosis 05/04/2016   Diverticulitis    Diverticulosis    Family history of adverse reaction to anesthesia    Mom PONV   Heart attack ([place]) 2010   History of carpal tunnel syndrome    Bilateral   History of kidney stones    Hyperlipidemia    Hypertension    Intra-abdominal abscess (HCC)    Joint pain    Morbid obesity ([employer])    Myocardial infarction ([place]) 05/04/2009   PONV  (postoperative nausea and vomiting)    Pre-diabetes    Skin cancer    on nose   Sleep apnea    not using cpap at this time   Vitamin D deficiency     Observations/Objective: Physical Exam Constitutional:      General: She is not in acute distress.    Appearance: Normal appearance. She is not ill-appearing or toxic-appearing.  Eyes:     General: No scleral icterus.       Right eye: No discharge.        Left eye: No discharge.     Conjunctiva/sclera: Conjunctivae normal.  Pulmonary:     Effort: Pulmonary effort is normal. No respiratory distress.  Neurological:     Mental Status: She is alert and oriented to person, place, and time.  Psychiatric:        Mood and Affect: Mood normal.        Behavior: Behavior normal.        Thought Content: Thought content normal.        Judgment: Judgment normal.    Assessment and Plan: 1. Acute colitis Encouraged to drink water, Gatorade, or Pedialyte instead of drinks with caffeine (tea). Advised to collect stool samples before starting antibiotic.  - azithromycin (ZITHROMAX) 500 MG tablet; Take 1 tablet (500 mg total) by mouth daily for 3 days.  Dispense: 3 tablet; Refill: 0  2. Diarrhea in adult patient - Cdiff NAA+O+P+Stool Culture; Future - azithromycin (ZITHROMAX) 500 MG tablet; Take 1 tablet (500 mg total) by mouth daily for 3 days.  Dispense: 3 tablet; Refill: 0   Follow Up Instructions:   I discussed the assessment and treatment plan with the patient. The patient was provided an opportunity to ask questions and all were answered. The patient agreed with the plan and demonstrated an understanding of the instructions.   The patient was advised to call back or seek an in-person evaluation if the symptoms worsen or if the condition fails to improve as anticipated.  The above assessment and management plan was discussed with the patient. The patient verbalized understanding of and has agreed to the management plan. Patient is aware to  call the clinic if symptoms persist or worsen. Patient is aware when to return to the clinic for a follow-up visit. Patient educated on when it is appropriate to go to the emergency department.   Time call ended: 12:15 PM  I provided 11 minutes of face-to-face time during this encounter.   Hendricks Limes, MSN, APRN, FNP-C [facility] 04/08/22

## 2022-04-09 ENCOUNTER — Other Ambulatory Visit: Payer: Self-pay

## 2022-04-09 DIAGNOSIS — R197 Diarrhea, unspecified: Secondary | ICD-10-CM

## 2022-04-10 ENCOUNTER — Other Ambulatory Visit (HOSPITAL_COMMUNITY): Payer: Self-pay

## 2022-04-12 ENCOUNTER — Other Ambulatory Visit: Payer: Self-pay | Admitting: Family Medicine

## 2022-04-12 DIAGNOSIS — A0472 Enterocolitis due to Clostridium difficile, not specified as recurrent: Secondary | ICD-10-CM

## 2022-04-12 MED ORDER — METRONIDAZOLE 500 MG PO TABS: 500.0000 mg | ORAL_TABLET | Freq: Three times a day (TID) | ORAL | 0 refills | Status: AC

## 2022-04-12 MED ORDER — FIDAXOMICIN 200 MG PO TABS: 200.0000 mg | ORAL_TABLET | Freq: Two times a day (BID) | ORAL | 0 refills | Status: DC

## 2022-04-12 MED ORDER — FLUCONAZOLE 150 MG PO TABS: 150.0000 mg | ORAL_TABLET | Freq: Once | ORAL | 0 refills | Status: AC

## 2022-04-12 NOTE — Addendum Note (Signed)
Addended by: Hendricks Limes F on: 04/12/2022 10:29 AM   Modules accepted: Orders

## 2022-04-12 NOTE — Addendum Note (Signed)
Addended by: Hendricks Limes F on: 04/12/2022 02:16 PM   Modules accepted: Orders

## 2022-04-13 ENCOUNTER — Other Ambulatory Visit (HOSPITAL_COMMUNITY): Payer: Self-pay

## 2022-04-13 LAB — CDIFF NAA+O+P+STOOL CULTURE
E coli, Shiga toxin Assay: NEGATIVE
Toxigenic C. Difficile by PCR: POSITIVE — AB

## 2022-04-17 ENCOUNTER — Other Ambulatory Visit (HOSPITAL_COMMUNITY): Payer: Self-pay

## 2022-04-20 ENCOUNTER — Other Ambulatory Visit: Payer: Self-pay | Admitting: Family Medicine

## 2022-04-20 ENCOUNTER — Other Ambulatory Visit (HOSPITAL_COMMUNITY): Payer: Self-pay

## 2022-04-20 DIAGNOSIS — I2510 Atherosclerotic heart disease of native coronary artery without angina pectoris: Secondary | ICD-10-CM

## 2022-04-20 DIAGNOSIS — E669 Obesity, unspecified: Secondary | ICD-10-CM

## 2022-04-20 MED ORDER — WEGOVY 1.7 MG/0.75ML ~~LOC~~ SOAJ
1.7000 mg | SUBCUTANEOUS | 0 refills | Status: DC
  Filled 2022-04-20: qty 3, 28d supply, fill #0

## 2022-04-21 ENCOUNTER — Other Ambulatory Visit (HOSPITAL_COMMUNITY): Payer: Self-pay

## 2022-04-26 ENCOUNTER — Other Ambulatory Visit (HOSPITAL_COMMUNITY): Payer: Self-pay

## 2022-04-27 ENCOUNTER — Other Ambulatory Visit (HOSPITAL_COMMUNITY): Payer: Self-pay

## 2022-04-29 ENCOUNTER — Other Ambulatory Visit (HOSPITAL_COMMUNITY): Payer: Self-pay

## 2022-05-03 ENCOUNTER — Other Ambulatory Visit: Payer: Self-pay | Admitting: Family Medicine

## 2022-05-03 ENCOUNTER — Encounter: Payer: Self-pay | Admitting: Family Medicine

## 2022-05-03 DIAGNOSIS — I2510 Atherosclerotic heart disease of native coronary artery without angina pectoris: Secondary | ICD-10-CM

## 2022-05-03 DIAGNOSIS — D229 Melanocytic nevi, unspecified: Secondary | ICD-10-CM

## 2022-05-10 ENCOUNTER — Ambulatory Visit (INDEPENDENT_AMBULATORY_CARE_PROVIDER_SITE_OTHER): Payer: No Typology Code available for payment source | Admitting: Family Medicine

## 2022-05-10 ENCOUNTER — Encounter: Payer: Self-pay | Admitting: Family Medicine

## 2022-05-10 VITALS — BP 138/80 | HR 82 | Temp 97.0°F | Ht 62.0 in | Wt 185.0 lb

## 2022-05-10 DIAGNOSIS — N3000 Acute cystitis without hematuria: Secondary | ICD-10-CM | POA: Diagnosis not present

## 2022-05-10 LAB — URINALYSIS, ROUTINE W REFLEX MICROSCOPIC
Bilirubin, UA: NEGATIVE
Glucose, UA: NEGATIVE
Ketones, UA: NEGATIVE
Nitrite, UA: POSITIVE — AB
Specific Gravity, UA: 1.02 (ref 1.005–1.030)
Urobilinogen, Ur: 0.2 mg/dL (ref 0.2–1.0)
pH, UA: 6.5 (ref 5.0–7.5)

## 2022-05-10 LAB — MICROSCOPIC EXAMINATION
RBC, Urine: >30 /hpf — ABNORMAL HIGH (ref 0–2)
WBC, UA: >30 /hpf — ABNORMAL HIGH (ref 0–5)

## 2022-05-10 MED ORDER — CEPHALEXIN 500 MG PO CAPS: 500.0000 mg | ORAL_CAPSULE | Freq: Two times a day (BID) | ORAL | 0 refills | Status: AC

## 2022-05-10 MED ORDER — PHENAZOPYRIDINE HCL 200 MG PO TABS: 200.0000 mg | ORAL_TABLET | Freq: Three times a day (TID) | ORAL | 0 refills | Status: AC | PRN

## 2022-05-10 MED ORDER — CEFTRIAXONE SODIUM 1 G IJ SOLR
1.0000 g | Freq: Once | INTRAMUSCULAR | Status: AC
  Administered 2022-05-10: 1 g via INTRAMUSCULAR

## 2022-05-10 NOTE — Progress Notes (Signed)
Subjective: CC: UTI PCP: [name], DO [id number]:[id number] K Nearhood is a 61 y.o. female presenting to clinic today for:  1. UTI Patient reports a 1 day history of increased pressure, dysuria, frequency and urgency.  Denies any hematuria or fevers but does report slight nausea.   ROS: Per HPI  Allergies  Allergen Reactions   Ciprofloxacin Itching   Past Medical History:  Diagnosis Date   Arthritis    Back pain    CAD (coronary artery disease)    Chest pain    Complication of anesthesia    Constipation    Coronary arteriosclerosis 05/04/2016   Diverticulitis    Diverticulosis    Family history of adverse reaction to anesthesia    Mom PONV   Heart attack ([place]) 2010   History of carpal tunnel syndrome    Bilateral   History of kidney stones    Hyperlipidemia    Hypertension    Intra-abdominal abscess (HCC)    Joint pain    Morbid obesity ([place])    Myocardial infarction ([place]) 05/04/2009   PONV (postoperative nausea and vomiting)    Pre-diabetes    Skin cancer    on nose   Sleep apnea    not using cpap at this time   Vitamin D deficiency     Current Outpatient Medications:    docusate sodium (COLACE) 100 MG capsule, Take 200 mg by mouth in the morning., Disp: , Rfl:    DULoxetine (CYMBALTA) 30 MG capsule, Take 1 capsule (30 mg total) by mouth daily., Disp: 90 capsule, Rfl: 3   Evolocumab (REPATHA SURECLICK) 409 MG/ML SOAJ, Inject 140 mg into the skin every 14 days., Disp: 2 mL, Rfl: 5   losartan-hydrochlorothiazide (HYZAAR) 100-25 MG tablet, TAKE 1 TABLET BY MOUTH DAILY., Disp: 90 tablet, Rfl: 3   metoprolol tartrate (LOPRESSOR) 25 MG tablet, TAKE 1 TABLET BY MOUTH 2 TIMES DAILY, Disp: 180 tablet, Rfl: 3   nitroGLYCERIN (NITROSTAT) 0.4 MG SL tablet, DISSOLVE 1 TABLET UNDER TONGUE EVERY 5 MINUTES UP TO 3 TIMES FOR CHEST PAIN THEN CALL DR IF NO RELIEF (Patient not taking: Reported on 03/02/2022), Disp: 25 tablet, Rfl: 3   Semaglutide-Weight Management (WEGOVY) 1.7  MG/0.75ML SOAJ, Inject 1.7 mg into the skin every 7 (seven) days., Disp: 3 mL, Rfl: 0   Semaglutide-Weight Management (WEGOVY) 2.4 MG/0.75ML SOAJ, Inject 2.4 mg into the skin every 7 days. 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 [employer]   Smoking status: Former    Packs/day: 1.00    Years: 20.00    Total pack years: 20.00    Types: Cigarettes    Quit date: 2010    Years since quitting: 13.7   Smokeless tobacco: Never  Vaping Use   Vaping Use: Never used  Substance and Sexual Activity   Alcohol use: Not Currently    Comment: Rare    Drug use: No   Sexual activity: Yes  Birth control/protection: Post-menopausal  Other Topics Concern   Not on file  Social History Narrative   Caffeine daily    Right handed   One story home   Social Determinants of Health   Financial Resource Strain: Not on file  Food Insecurity: Not on file  Transportation Needs: Not on file  Physical Activity: Not on file  Stress: Not on file  Social Connections: Not on file  Intimate Partner Violence: Not on file   Family History  Problem Relation Age of Onset   Hyperlipidemia Mother    Hypertension Mother    Cancer Father        skin   Hyperlipidemia Father    Dementia Father    Arthritis Father    Hypertension Father    Sleep apnea Father    Other Sister        Myelofibrosis   Arthritis Sister    Hyperlipidemia Sister    Arthritis Sister    Hyperlipidemia Sister    Diabetes Paternal Grandfather    Colon cancer Neg Hx      Objective: Office vital signs reviewed. BP 138/80   Pulse 82   Temp (!) 97 F (36.1 C)   Ht '5\' 2"'$  (1.575 m)   Wt 185 lb (83.9 kg)   LMP 12/31/2013   SpO2 97%   BMI 33.84 kg/m   Physical Examination:  General: Awake, alert, well nourished, nontoxic female.  No acute distress  Assessment/ Plan: 61 y.o. female   Acute cystitis without hematuria - Plan: Urine Culture, Urinalysis, Routine w reflex microscopic, cefTRIAXone (ROCEPHIN) injection 1 g, cephALEXin (KEFLEX) 500 MG capsule, phenazopyridine (PYRIDIUM) 200 MG tablet  Rocephin administered today since she cannot get to the pharmacy until much later today.  She can start Keflex p.o. tomorrow.  Pyridium sent if needed for the next 2 days.  Flush with water.  She understands red flag signs and symptoms which would warrant further evaluation.  Follow-up as needed  Orders Placed This Encounter  Procedures   Urine Culture   Urinalysis, Routine w reflex microscopic   No orders of the defined types were placed in this encounter.    [name], DO [place] [phone]

## 2022-05-12 LAB — URINE CULTURE

## 2022-05-19 ENCOUNTER — Other Ambulatory Visit (HOSPITAL_COMMUNITY): Payer: Self-pay

## 2022-05-19 ENCOUNTER — Telehealth: Payer: Self-pay | Admitting: Pharmacist

## 2022-05-19 DIAGNOSIS — E669 Obesity, unspecified: Secondary | ICD-10-CM

## 2022-05-19 DIAGNOSIS — I2510 Atherosclerotic heart disease of native coronary artery without angina pectoris: Secondary | ICD-10-CM

## 2022-05-19 MED ORDER — WEGOVY 2.4 MG/0.75ML ~~LOC~~ SOAJ
2.4000 mg | SUBCUTANEOUS | 5 refills | Status: DC
  Filled 2022-05-19: qty 3, 28d supply, fill #0

## 2022-05-19 NOTE — Telephone Encounter (Signed)
[facility] refills sent Patient tolerating well Denies personal and family history of Medullary thyroid cancer (MTC)

## 2022-05-26 ENCOUNTER — Other Ambulatory Visit (HOSPITAL_COMMUNITY): Payer: Self-pay

## 2022-05-31 ENCOUNTER — Other Ambulatory Visit (HOSPITAL_COMMUNITY): Payer: Self-pay

## 2022-06-01 ENCOUNTER — Other Ambulatory Visit (HOSPITAL_COMMUNITY): Payer: Self-pay

## 2022-06-17 ENCOUNTER — Ambulatory Visit (INDEPENDENT_AMBULATORY_CARE_PROVIDER_SITE_OTHER): Payer: No Typology Code available for payment source | Admitting: Family Medicine

## 2022-06-17 ENCOUNTER — Encounter: Payer: Self-pay | Admitting: Family Medicine

## 2022-06-17 VITALS — BP 132/81 | HR 72 | Ht 62.0 in

## 2022-06-17 DIAGNOSIS — M65342 Trigger finger, left ring finger: Secondary | ICD-10-CM | POA: Diagnosis not present

## 2022-06-17 DIAGNOSIS — M65331 Trigger finger, right middle finger: Secondary | ICD-10-CM | POA: Diagnosis not present

## 2022-06-17 MED ORDER — METHYLPREDNISOLONE ACETATE 40 MG/ML IJ SUSP
40.0000 mg | Freq: Once | INTRAMUSCULAR | Status: AC
  Administered 2022-06-17: 40 mg via INTRAMUSCULAR

## 2022-06-17 NOTE — Progress Notes (Signed)
BP 132/81   Pulse 72   Ht '5\' 2"'$  (1.575 m)   LMP 12/31/2013   SpO2 98%   BMI 33.84 kg/m    Subjective:   Patient ID: Heather Lam, female    DOB: 01/21/1961, 61 y.o.   MRN: [id number]  HPI: Heather Lam is a 61 y.o. female presenting on 06/17/2022 for Hand Pain (Bilateral palms. Right 4th finger, left 3rd finger- requesting injection)  Over the past few months, Heather Lam has noticed worsening pain and in her 4th finger on the left hand and 3rd finger on the right hand. Previously felt "catching" of the fingers with extension of the fingers, but now is noticing that she cannot flex the fingers all the way. Notes significant pain in these joints. Also notices worsening. No fevers, swelling, or erythema of the joints. Does have occasional pain of other proximal joints. Pain ebbs and flows.   Heather Lam notes that she currently is taking 30 mg of Cymbalta every other day. Reports that when she initially discontinued Cymbalta, she noticed worsening joint pain symptoms as well as multisite pain, including in the arms, neck, shoulders. Previous CRP, ANA, and Rheumatoid Factor levels were normal. No history of rheumatoid arthritis in family. Heather Lam reports that her pain worsens throughout the day, is not noticing pain when she wakes up in the morning.   Relevant past medical, surgical, family and social history reviewed and updated as indicated. Interim medical history since our last visit reviewed. Allergies and medications reviewed and updated.  Review of Systems  Review of Systems  Constitutional:  Negative for activity change, appetite change, chills, diaphoresis, fatigue, fever and unexpected weight change.  HENT:  Negative for congestion, ear pain, facial swelling, sneezing and sore throat.   Respiratory:  Negative for cough, choking, chest tightness and shortness of breath.   Cardiovascular:  Negative for chest pain, palpitations and leg swelling.  Gastrointestinal:  Negative for blood in stool,  constipation, diarrhea and nausea.  Endocrine: Negative for cold intolerance and heat intolerance.  Genitourinary:  Negative for difficulty urinating, dysuria, penile pain, testicular pain and urgency.  Musculoskeletal:  Negative for arthralgias and back pain.  Skin:  Negative for color change and pallor.  Neurological:  Negative for dizziness, numbness and headaches.    Allergies as of 06/17/2022       Reactions   Ciprofloxacin Itching        Medication List        Accurate as of June 17, 2022  4:18 PM. If you have any questions, ask your nurse or doctor.          docusate sodium 100 MG capsule Commonly known as: COLACE Take 200 mg by mouth in the morning.   DULoxetine 30 MG capsule Commonly known as: Cymbalta Take 1 capsule (30 mg total) by mouth daily.   losartan-hydrochlorothiazide 100-25 MG tablet Commonly known as: HYZAAR TAKE 1 TABLET BY MOUTH DAILY.   metoprolol tartrate 25 MG tablet Commonly known as: LOPRESSOR Take 1 tablet (25 mg total) by mouth 2 (two) times daily.   nitroGLYCERIN 0.4 MG SL tablet Commonly known as: Nitrostat DISSOLVE 1 TABLET UNDER TONGUE EVERY 5 MINUTES UP TO 3 TIMES FOR CHEST PAIN THEN CALL DR IF NO RELIEF   Repatha SureClick 528 MG/ML Soaj Generic drug: Evolocumab Inject 140 mg into the skin every 14 days.   traZODone 50 MG tablet Commonly known as: DESYREL TAKE 1 TO 2 TABLETS BY MOUTH AT BEDTIME AS NEEDED FOR SLEEP  Wegovy 2.4 MG/0.75ML Soaj Generic drug: Semaglutide-Weight Management Inject 2.4 mg into the skin once a week.         Objective:   BP 132/81   Pulse 72   Ht '5\' 2"'$  (1.575 m)   LMP 12/31/2013   SpO2 98%   BMI 33.84 kg/m   Wt Readings from Last 3 Encounters:  05/10/22 185 lb (83.9 kg)  03/02/22 191 lb (86.6 kg)  02/19/22 190 lb (86.2 kg)    Physical Exam  General: Awake, alert, well nourished, No acute distress Cardio: regular rate and rhythm, S1S2 heard, no murmurs appreciated Pulm: clear  to auscultation bilaterally, no wheezes, rhonchi or rales; normal work of breathing on room air Extremities: warm, well perfused, No edema, cyanosis or clubbing; +1 pulses bilaterally Skin: dry; intact; no rashes or lesions    Assessment & Plan:   Problem List Items Addressed This Visit   None Visit Diagnoses     Trigger middle finger of right hand    -  Primary   Relevant Medications   methylPREDNISolone acetate (DEPO-MEDROL) injection 40 mg (Completed)   methylPREDNISolone acetate (DEPO-MEDROL) injection 40 mg (Completed)   Trigger ring finger of left hand       Relevant Medications   methylPREDNISolone acetate (DEPO-MEDROL) injection 40 mg (Completed)   methylPREDNISolone acetate (DEPO-MEDROL) injection 40 mg (Completed)        Assessment:  Heather Lam reports that she has had continued swelling and pain in her hand and wrists, as well as pain. Reports occasional catching of the fourth finger of her left consistent with trigger finger. Stiffness and difficulty flexing her left finger is most likely secondary to arthritis. Given trigger finger and arthritis pain that has not resolved over the past six weeks, a local glucocorticoid injection was appropriate next step. Injection was completed with lidocaine and methyl prednisone.   Patient notes that she is taking 30 mg of Cymbalta every other day. Reports that when she initially got off Cymbalta, she noticed worsening symptoms as well as multisite pain, including in the arms, neck, shoulders. Discussed that moving forward patient may address symptoms through increasing dose of Cymbalta. She will consider this and follow-up.   Procedure Note:  The patient presented with complaints of pain and stiffness in the left third finger and right fourth finger consistent with trigger finger. Conservative measures including non-steroidal anti-inflammatory drugs (NSAIDs), have been ineffective. The decision for trigger point injections was made after  discussing potential benefits, risks, and alternatives with the patient.  Consent: Informed consent was obtained from the patient, including a discussion of the procedure, potential risks, and benefits.  Patient Positioning: The patient was comfortably positioned, and the affected fingers were adequately exposed.  Sterilization: The left third finger and right fourth finger were prepared with antiseptic solution, maintaining a sterile field throughout the procedure.  Anesthesia: Topical anesthesia was applied to the injection sites using a local anesthetic.  Needle Insertion: A 25-gauge needle was used for each injection. The left third finger's trigger point was identified, and the needle was inserted lateral to the tendon sheath followed by the injection of 40 mL of Depo-Medrol and 1 ml of norepinephrine. The same procedure was repeated for the right fourth finger.  Aspiration: Negative aspiration was confirmed before the injection to ensure proper needle placement.  Bandaging: Bandaid applied  Post-Injection Instructions: The patient was instructed to monitor for any signs of infection, excessive bleeding, or worsening symptoms. Follow-up instructions were provided.  Complications: The patient tolerated  the procedure well, and there were no immediate complications noted.  Follow up plan: Return if symptoms worsen or fail to improve.  Counseling provided for all of the vaccine components No orders of the defined types were placed in this encounter.  Stephani Police, MS3 06/17/2022, 4:18 PM  Patient seen and examined medical student, procedure was performed by myself on 1 hand medical student on the left hand.  Patient tolerated procedure well. Caryl Pina, MD [facility] 06/23/2022, 3:21 PM

## 2022-07-05 ENCOUNTER — Ambulatory Visit (INDEPENDENT_AMBULATORY_CARE_PROVIDER_SITE_OTHER): Payer: No Typology Code available for payment source | Admitting: Family

## 2022-07-05 ENCOUNTER — Encounter: Payer: Self-pay | Admitting: Family

## 2022-07-05 DIAGNOSIS — R399 Unspecified symptoms and signs involving the genitourinary system: Secondary | ICD-10-CM | POA: Diagnosis not present

## 2022-07-05 MED ORDER — CEPHALEXIN 500 MG PO CAPS: 500.0000 mg | ORAL_CAPSULE | Freq: Two times a day (BID) | ORAL | 0 refills | Status: DC

## 2022-07-05 NOTE — Progress Notes (Signed)
Virtual Visit  Note Due to COVID-19 pandemic this visit was conducted virtually. This visit type was conducted due to national recommendations for restrictions regarding the COVID-19 Pandemic (e.g. social distancing, sheltering in place) in an effort to limit this patient's exposure and mitigate transmission in our community. All issues noted in this document were discussed and addressed.  A physical exam was not performed with this format.  I connected with Heather Lam on 07/05/22 at 2:07 pm by telephone and verified that I am speaking with the correct person using two identifiers. Heather Lam is currently located at work and no one is currently with her during visit. The provider, [name], FNP is located in their office at time of visit.  I discussed the limitations, risks, security and privacy concerns of performing an evaluation and management service by telephone and the availability of in person appointments. I also discussed with the patient that there may be a patient responsible charge related to this service. The patient expressed understanding and agreed to proceed.  Heather Lam, Heather Lam are scheduled for a virtual visit with your provider today.    Just as we do with appointments in the office, we must obtain your consent to participate.  Your consent will be active for this visit and any virtual visit you may have with one of our providers in the next 365 days.    If you have a MyChart account, I can also send a copy of this consent to you electronically.  All virtual visits are billed to your insurance company just like a traditional visit in the office.  As this is a virtual visit, video technology does not allow for your provider to perform a traditional examination.  This may limit your provider's ability to fully assess your condition.  If your provider identifies any concerns that need to be evaluated in person or the need to arrange testing such as labs, EKG, etc, we will  make arrangements to do so.    Although advances in technology are sophisticated, we cannot ensure that it will always work on either your end or our end.  If the connection with a video visit is poor, we may have to switch to a telephone visit.  With either a video or telephone visit, we are not always able to ensure that we have a secure connection.   I need to obtain your verbal consent now.   Are you willing to proceed with your visit today?   Heather Lam has provided verbal consent on 07/05/2022 for a virtual visit (video or telephone).   [name], [place] 07/05/2022  2:08 PM   History and Present Illness:  Urinary Frequency  This is a new problem. The current episode started yesterday. The problem has been gradually worsening. The pain is at a severity of 0/10. The patient is experiencing no pain. Associated symptoms include frequency and urgency. Pertinent negatives include no hematuria, nausea or vomiting. She has tried increased fluids for the symptoms. The treatment provided no relief.      Review of Systems  Gastrointestinal:  Negative for nausea and vomiting.  Genitourinary:  Positive for frequency and urgency. Negative for hematuria.     Observations/Objective: No SOB or distress noted   Assessment and Plan: 1. UTI symptoms Force fluids AZO over the counter X2 days Follow up if symptoms worsen or do not improve  - cephALEXin (KEFLEX) 500 MG capsule; Take 1 capsule (500 mg total) by mouth 2 (two) times  daily.  Dispense: 14 capsule; Refill: 0     I discussed the assessment and treatment plan with the patient. The patient was provided an opportunity to ask questions and all were answered. The patient agreed with the plan and demonstrated an understanding of the instructions.   The patient was advised to call back or seek an in-person evaluation if the symptoms worsen or if the condition fails to improve as anticipated.  The above assessment and management plan  was discussed with the patient. The patient verbalized understanding of and has agreed to the management plan. Patient is aware to call the clinic if symptoms persist or worsen. Patient is aware when to return to the clinic for a follow-up visit. Patient educated on when it is appropriate to go to the emergency department.   Time call ended:  2:18 pm   I provided 11 minutes of  non face-to-face time during this encounter.    [name], FNP

## 2022-07-05 NOTE — Patient Instructions (Signed)
Urinary Tract Infection, Adult  A urinary tract infection (UTI) is an infection of any part of the urinary tract. The urinary tract includes the kidneys, ureters, bladder, and urethra. These organs make, store, and get rid of urine in the body. An upper UTI affects the ureters and kidneys. A lower UTI affects the bladder and urethra. What are the causes? Most urinary tract infections are caused by bacteria in your genital area around your urethra, where urine leaves your body. These bacteria grow and cause inflammation of your urinary tract. What increases the risk? You are more likely to develop this condition if: You have a urinary catheter that stays in place. You are not able to control when you urinate or have a bowel movement (incontinence). You are female and you: Use a spermicide or diaphragm for birth control. Have low estrogen levels. Are pregnant. You have certain genes that increase your risk. You are sexually active. You take antibiotic medicines. You have a condition that causes your flow of urine to slow down, such as: An enlarged prostate, if you are female. Blockage in your urethra. A kidney stone. A nerve condition that affects your bladder control (neurogenic bladder). Not getting enough to drink, or not urinating often. You have certain medical conditions, such as: Diabetes. A weak disease-fighting system (immunesystem). Sickle cell disease. Gout. Spinal cord injury. What are the signs or symptoms? Symptoms of this condition include: Needing to urinate right away (urgency). Frequent urination. This may include small amounts of urine each time you urinate. Pain or burning with urination. Blood in the urine. Urine that smells bad or unusual. Trouble urinating. Cloudy urine. Vaginal discharge, if you are female. Pain in the abdomen or the lower back. You may also have: Vomiting or a decreased appetite. Confusion. Irritability or tiredness. A fever or  chills. Diarrhea. The first symptom in older adults may be confusion. In some cases, they may not have any symptoms until the infection has worsened. How is this diagnosed? This condition is diagnosed based on your medical history and a physical exam. You may also have other tests, including: Urine tests. Blood tests. Tests for STIs (sexually transmitted infections). If you have had more than one UTI, a cystoscopy or imaging studies may be done to determine the cause of the infections. How is this treated? Treatment for this condition includes: Antibiotic medicine. Over-the-counter medicines to treat discomfort. Drinking enough water to stay hydrated. If you have frequent infections or have other conditions such as a kidney stone, you may need to see a health care provider who specializes in the urinary tract (urologist). In rare cases, urinary tract infections can cause sepsis. Sepsis is a life-threatening condition that occurs when the body responds to an infection. Sepsis is treated in the hospital with IV antibiotics, fluids, and other medicines. Follow these instructions at home:  Medicines Take over-the-counter and prescription medicines only as told by your health care provider. If you were prescribed an antibiotic medicine, take it as told by your health care provider. Do not stop using the antibiotic even if you start to feel better. General instructions Make sure you: Empty your bladder often and completely. Do not hold urine for long periods of time. Empty your bladder after sex. Wipe from front to back after urinating or having a bowel movement if you are female. Use each tissue only one time when you wipe. Drink enough fluid to keep your urine pale yellow. Keep all follow-up visits. This is important. Contact a health   care provider if: Your symptoms do not get better after 1-2 days. Your symptoms go away and then return. Get help right away if: You have severe pain in  your back or your lower abdomen. You have a fever or chills. You have nausea or vomiting. Summary A urinary tract infection (UTI) is an infection of any part of the urinary tract, which includes the kidneys, ureters, bladder, and urethra. Most urinary tract infections are caused by bacteria in your genital area. Treatment for this condition often includes antibiotic medicines. If you were prescribed an antibiotic medicine, take it as told by your health care provider. Do not stop using the antibiotic even if you start to feel better. Keep all follow-up visits. This is important. This information is not intended to replace advice given to you by your health care provider. Make sure you discuss any questions you have with your health care provider. Document Revised: 02/29/2020 Document Reviewed: 02/29/2020 Elsevier Patient Education  2023 Elsevier Inc.  

## 2022-07-08 ENCOUNTER — Other Ambulatory Visit (HOSPITAL_COMMUNITY): Payer: Self-pay

## 2022-07-15 ENCOUNTER — Other Ambulatory Visit (HOSPITAL_COMMUNITY): Payer: Self-pay

## 2022-07-15 ENCOUNTER — Telehealth: Payer: Self-pay | Admitting: Pharmacist

## 2022-07-15 DIAGNOSIS — E669 Obesity, unspecified: Secondary | ICD-10-CM

## 2022-07-15 MED ORDER — ZEPBOUND 5 MG/0.5ML ~~LOC~~ SOAJ
5.0000 mg | SUBCUTANEOUS | 2 refills | Status: DC
  Filled 2022-07-15: qty 2, 28d supply, fill #0

## 2022-07-15 NOTE — Telephone Encounter (Signed)
[place]eg[place] to Zepbound transition for obesity Patient has lost 5% or more of body weight  Her BMI remains >30 She continues to meet medical criteria for anti-obesity medication

## 2022-07-17 ENCOUNTER — Other Ambulatory Visit (HOSPITAL_COMMUNITY): Payer: Self-pay

## 2022-07-18 NOTE — Progress Notes (Unsigned)
Cardiology Office Note:    Date:  07/19/2022   ID:  Heather Lam, DOB [birth year], MRN [id number]  PCP:  [name], DO  Cardiologist:  Sinclair Grooms, MD   Referring MD: [name], DO   Chief Complaint  Patient presents with   Coronary Artery Disease   Hypertension   Hyperlipidemia    History of Present Illness:    Heather Lam is a 61 y.o. female with a hx of  coronary artery disease with prior acute inferior infarction 2010 stent to RCA and intermediate stenosis in the mid LAD treated medically.  2012 nuclear study negative for ischemia. Other problems include hyperlipidemia, hypertension, and anxiety disorder.  nuc study in 2016 with EF 74% normal study.  recent lipids LDL 51.  TG at 183.   Had hip arthroplasty 09/2021, [place].  There are no cardiac complaints.  She has not had her antihypertensive therapy yet today.  She does not take her medicines in the morning until after she has eaten.  We are not planning on doing laboratory work today.  There is no peripheral edema.  Denies palpitations.  No episodes of syncope.  Denies chest pain.  Past Medical History:  Diagnosis Date   Arthritis    Back pain    CAD (coronary artery disease)    Chest pain    Complication of anesthesia    Constipation    Coronary arteriosclerosis 05/04/2016   Diverticulitis    Diverticulosis    Family history of adverse reaction to anesthesia    Mom PONV   Heart attack ([place]) 2010   History of carpal tunnel syndrome    Bilateral   History of kidney stones    Hyperlipidemia    Hypertension    Intra-abdominal abscess ([facility])    Joint pain    Morbid obesity ([place])    Myocardial infarction ([place]) 05/04/2009   PONV (postoperative nausea and vomiting)    Pre-diabetes    Skin cancer    on nose   Sleep apnea    not using cpap at this time   Vitamin D deficiency     Past Surgical History:  Procedure Laterality Date   APPENDECTOMY     APPLICATION OF WOUND VAC N/A  02/09/2019   Procedure: APPLICATION OF WOUND VAC;  Surgeon: Clovis Riley, MD;  Location: [place] [facility];  Service: General;  Laterality: N/A;   BACK SURGERY     CARPAL TUNNEL RELEASE Right    CARPAL TUNNEL RELEASE Left 08/13/2016   Procedure: LEFT CARPAL TUNNEL RELEASE;  Surgeon: Roseanne Kaufman, MD;  Location: [place];  Service: Orthopedics;  Laterality: Left;   COLECTOMY WITH COLOSTOMY CREATION/HARTMANN PROCEDURE N/A 02/09/2019   Procedure: HARTMANN PROCEDURE;  Surgeon: Clovis Riley, MD;  Location: [place] [facility];  Service: General;  Laterality: N/A;   COLONOSCOPY     COLOSTOMY N/A 02/09/2019   Procedure: COLOSTOMY;  Surgeon: Clovis Riley, MD;  Location: [place];  Service: General;  Laterality: N/A;   COLOSTOMY REVERSAL N/A 07/30/2019   Procedure: LAPAROSCOPIC ASSISTED REVERSAL OF END COLOSTOMY WITH RIGID PROCTOSCOPY;  Surgeon: Clovis Riley, MD;  Location: [facility];  Service: General;  Laterality: N/A;   CORONARY STENT PLACEMENT     2 stents in the same artery   ENDOMETRIAL ABLATION  2005   [place] LITHOTRIPSY Right 06/27/2017   Procedure: RIGHT EXTRACORPOREAL SHOCK WAVE LITHOTRIPSY (ESWL);  Surgeon: Franchot Gallo, MD;  Location: [facility];  Service:  Urology;  Laterality: Right;   INCISIONAL HERNIA REPAIR N/A 10/10/2020   Procedure: LAPAROSCOPIC  WITH TRASITION TO OPEN INCISIONAL HERNIA REPAIR WITH MESH;  Surgeon: Clovis Riley, MD;  Location: [facility];  Service: General;  Laterality: N/A;   KNEE ARTHROSCOPY WITH MENISCAL REPAIR Right    MOHS SURGERY     TOTAL HIP ARTHROPLASTY Left 10/26/2021   Procedure: TOTAL HIP ARTHROPLASTY ANTERIOR APPROACH;  Surgeon: Gaynelle Arabian, MD;  Location: [facility];  Service: Orthopedics;  Laterality: Left;   TUBAL LIGATION  1991    Current Medications: Current Meds  Medication Sig   Ascorbic Acid (VITAMIN C) 100 MG tablet Take 100 mg by mouth daily.   Aspirin 81 MG CAPS Take 1 capsule by mouth daily at 6 (six) AM.    Calcium 500 MG tablet Take 1 tablet by mouth daily at 6 (six) AM.   Cholecalciferol (VITAMIN D) 125 MCG (5000 UT) CAPS Take 1 capsule by mouth daily at 6 (six) AM.   docusate sodium (COLACE) 100 MG capsule Take 200 mg by mouth in the morning.   DULoxetine (CYMBALTA) 30 MG capsule Take 1 capsule (30 mg total) by mouth daily.   Evolocumab (REPATHA SURECLICK) 381 MG/ML SOAJ Inject 140 mg into the skin every 14 days.   losartan-hydrochlorothiazide (HYZAAR) 100-25 MG tablet TAKE 1 TABLET BY MOUTH DAILY.   metoprolol tartrate (LOPRESSOR) 25 MG tablet Take 1 tablet (25 mg total) by mouth 2 (two) times daily.   nitroGLYCERIN (NITROSTAT) 0.4 MG SL tablet DISSOLVE 1 TABLET UNDER TONGUE EVERY 5 MINUTES UP TO 3 TIMES FOR CHEST PAIN THEN CALL DR IF NO RELIEF   Tirzepatide-Weight Management (ZEPBOUND) 5 MG/0.5ML SOAJ Inject 5 mg into the skin once a week.   traZODone (DESYREL) 50 MG tablet TAKE 1 TO 2 TABLETS BY MOUTH AT BEDTIME AS NEEDED FOR SLEEP   Zinc 100 MG TABS Take 1 tablet by mouth daily at 6 (six) AM.   [DISCONTINUED] cephALEXin (KEFLEX) 500 MG capsule Take 1 capsule (500 mg total) by mouth 2 (two) times daily.     Allergies:   Ciprofloxacin   Social History   Socioeconomic History   Marital status: Divorced    Spouse name: Not on file   Number of children: Not on file   Years of education: 14   Highest education level: Not on file  Occupational History   Occupation: Optician, dispensing:     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[employer]   Smoking status: Former    Packs/day: 1.00    Years: 20.00    Total pack years: 20.00    Types: Cigarettes    Quit date: 2010    Years since quitting: 13.9   Smokeless tobacco: Never  Vaping Use   Vaping Use: Never used  Substance and Sexual Activity   Alcohol use: Not Currently    Comment: Rare    Drug use: No   Sexual activity: Yes    Birth control/protection: Post-menopausal  Other Topics Concern   Not on  file  Social History Narrative   Caffeine daily    Right handed   One story home   Social Determinants of Health   Financial Resource Strain: Not on file  Food Insecurity: Not on file  Transportation Needs: Not on file  Physical Activity: Not on file  Stress: Not on file  Social Connections: Not on file     Family History: The patient's family history includes  Arthritis in her father, sister, and sister; Cancer in her father; Dementia in her father; Diabetes in her paternal grandfather; Hyperlipidemia in her father, mother, sister, and sister; Hypertension in her father and mother; Other in her sister; Sleep apnea in her father. There is no history of Colon cancer.  ROS:   Please see the history of present illness.    No new imaging has been performed.  Her mother recently had myocardial infarction and had a stent.  She is 61 years old and lives independently.  All other systems reviewed and are negative.  EKGs/Labs/Other Studies Reviewed:    The following studies were reviewed today: No new or recent imaging  EKG:  EKG normal sinus rhythm with normal-appearing EKG.  Horizontal axis.  Prominent voltage noted.  No change compared to prior.  Recent Labs: 10/16/2021: ALT 19 10/27/2021: BUN 17; Creatinine, Ser 0.65; Potassium 3.8; Sodium 137 02/18/2022: Hemoglobin 14.5; Platelets 274 03/02/2022: TSH 1.370  Recent Lipid Panel    Component Value Date/Time   CHOL 146 02/18/2022 0822   TRIG 257 (H) 02/18/2022 0822   TRIG 156 (H) 06/06/2013 1147   HDL 41 02/18/2022 0822   HDL 50 06/06/2013 1147   CHOLHDL 3.6 02/18/2022 0822   CHOLHDL 4.4 01/30/2019 0224   VLDL 19 01/30/2019 0224   LDLCALC 64 02/18/2022 0822   LDLCALC 50 06/06/2013 1147    Physical Exam:    VS:  BP (!) 160/90 Comment: Has not taken medications yet this morning  Pulse 71   Ht '5\' 3"'$  (1.6 m)   Wt 193 lb 12.8 oz (87.9 kg)   LMP 12/31/2013   SpO2 96%   BMI 34.33 kg/m     Wt Readings from Last 3 Encounters:   07/19/22 193 lb 12.8 oz (87.9 kg)  05/10/22 185 lb (83.9 kg)  03/02/22 191 lb (86.6 kg)     GEN: Overweight. No acute distress HEENT: Normal NECK: No JVD. LYMPHATICS: No lymphadenopathy CARDIAC: No murmur. RRR S4 but no S3 gallop, or edema. VASCULAR:  Normal Pulses. No bruits. RESPIRATORY:  Clear to auscultation without rales, wheezing or rhonchi  ABDOMEN: Soft, non-tender, non-distended, No pulsatile mass, MUSCULOSKELETAL: No deformity  SKIN: Warm and dry NEUROLOGIC:  Alert and oriented x 3 PSYCHIATRIC:  Normal affect   ASSESSMENT:    1. Coronary artery disease involving native coronary artery of native heart without angina pectoris   2. Essential hypertension   3. Mixed hyperlipidemia   4. OSA on CPAP    PLAN:    In order of problems listed above:  Secondary prevention reviewed.  Blood pressure is elevated.  She measures it frequently.  She has not had her medications yet this morning. Low-salt diet, weight loss, and exercise recommended.  She monitors her blood pressure at home and is usually in the 125 to 630 mmHg systolic range with diastolics less than 80 mmHg. Continue Repatha. Continue compliance with CPAP.  Target BP: <130/80 mmHg  Diet and lifestyle measures for BP control were reviewed in detail: Low sodium diet (<2.5 gm daily); alcohol restriction (<3 ounces per day); weight loss (Mediterranean); avoid non-steroidal agents; > 6 hours sleep per day; 150 min moderate exercise per week. Medical regimen will include at least 2 agents. Resistant hypertension if not controlled on 3 agents. Consider further evaluation: Sleep study to r/o OSA; Renal angiogram; Primary hyperaldonism and Pheochromocytoma w/u. After 3 agents, consider MRA (spironolactone)/ Epleronone), hydralazine, beta-blocker, and Minoxidil if not already in use due to patient profile.  Medication Adjustments/Labs and Tests Ordered: Current medicines are reviewed at length with the patient today.   Concerns regarding medicines are outlined above.  Orders Placed This Encounter  Procedures   EKG 12-Lead   No orders of the defined types were placed in this encounter.   Patient Instructions  Medication Instructions:  Your physician recommends that you continue on your current medications as directed. Please refer to the Current Medication list given to you today.  *If you need a refill on your cardiac medications before your next appointment, please call your pharmacy*  Follow-Up: At [facility], you and your health needs are our priority.  As part of our continuing mission to provide you with exceptional heart care, we have created designated Provider Care Teams.  These Care Teams include your primary Cardiologist (physician) and Advanced Practice Providers (APPs -  Physician Assistants and Nurse Practitioners) who all work together to provide you with the care you need, when you need it.  Your next appointment:   9-12 month(s)  The format for your next appointment:   In Person  Provider:   Gwyndolyn Kaufman, MD or Rudean Haskell, MD  Important Information About Sugar         Signed, Sinclair Grooms, MD  07/19/2022 9:53 AM    [place]

## 2022-07-19 ENCOUNTER — Encounter: Payer: Self-pay | Admitting: Interventional Cardiology

## 2022-07-19 ENCOUNTER — Ambulatory Visit
Payer: No Typology Code available for payment source | Attending: Interventional Cardiology | Admitting: Interventional Cardiology

## 2022-07-19 VITALS — BP 160/90 | HR 71 | Ht 63.0 in | Wt 193.8 lb

## 2022-07-19 DIAGNOSIS — I10 Essential (primary) hypertension: Secondary | ICD-10-CM

## 2022-07-19 DIAGNOSIS — G4733 Obstructive sleep apnea (adult) (pediatric): Secondary | ICD-10-CM | POA: Diagnosis not present

## 2022-07-19 DIAGNOSIS — E782 Mixed hyperlipidemia: Secondary | ICD-10-CM

## 2022-07-19 DIAGNOSIS — I2510 Atherosclerotic heart disease of native coronary artery without angina pectoris: Secondary | ICD-10-CM | POA: Diagnosis not present

## 2022-07-19 NOTE — Patient Instructions (Signed)
Medication Instructions:  Your physician recommends that you continue on your current medications as directed. Please refer to the Current Medication list given to you today.  *If you need a refill on your cardiac medications before your next appointment, please call your pharmacy*  Follow-Up: At [facility], you and your health needs are our priority.  As part of our continuing mission to provide you with exceptional heart care, we have created designated Provider Care Teams.  These Care Teams include your primary Cardiologist (physician) and Advanced Practice Providers (APPs -  Physician Assistants and Nurse Practitioners) who all work together to provide you with the care you need, when you need it.  Your next appointment:   9-12 month(s)  The format for your next appointment:   In Person  Provider:   Gwyndolyn Kaufman, MD or Rudean Haskell, MD  Important Information About Sugar

## 2022-07-21 ENCOUNTER — Other Ambulatory Visit (HOSPITAL_COMMUNITY): Payer: Self-pay

## 2022-07-22 ENCOUNTER — Encounter (HOSPITAL_COMMUNITY): Payer: Self-pay

## 2022-07-22 ENCOUNTER — Other Ambulatory Visit (HOSPITAL_COMMUNITY): Payer: Self-pay

## 2022-07-28 ENCOUNTER — Other Ambulatory Visit: Payer: Self-pay

## 2022-07-28 ENCOUNTER — Other Ambulatory Visit (HOSPITAL_COMMUNITY): Payer: Self-pay

## 2022-07-29 ENCOUNTER — Ambulatory Visit: Payer: No Typology Code available for payment source

## 2022-07-30 ENCOUNTER — Other Ambulatory Visit (HOSPITAL_COMMUNITY): Payer: Self-pay

## 2022-08-26 ENCOUNTER — Telehealth: Payer: Self-pay | Admitting: Pharmacist

## 2022-08-26 ENCOUNTER — Other Ambulatory Visit (HOSPITAL_COMMUNITY): Payer: Self-pay

## 2022-08-26 ENCOUNTER — Other Ambulatory Visit: Payer: Self-pay

## 2022-09-02 ENCOUNTER — Telehealth: Payer: Self-pay | Admitting: Pharmacist

## 2022-09-02 MED ORDER — ZEPBOUND 7.5 MG/0.5ML ~~LOC~~ SOAJ
7.5000 mg | SUBCUTANEOUS | 2 refills | Status: DC
  Filled 2022-09-02: qty 2, 28d supply, fill #0
  Filled 2022-09-29: qty 2, 28d supply, fill #1

## 2022-09-02 NOTE — Telephone Encounter (Signed)
Increase to zepbound 7.'5mg'$  weekly Patient tolerating well  Denies personal and family history of Medullary thyroid cancer ([facility]C)

## 2022-09-03 ENCOUNTER — Other Ambulatory Visit: Payer: Self-pay

## 2022-09-24 ENCOUNTER — Other Ambulatory Visit: Payer: Self-pay | Admitting: Family Medicine

## 2022-09-24 DIAGNOSIS — E782 Mixed hyperlipidemia: Secondary | ICD-10-CM

## 2022-09-24 DIAGNOSIS — I252 Old myocardial infarction: Secondary | ICD-10-CM

## 2022-09-24 DIAGNOSIS — I2510 Atherosclerotic heart disease of native coronary artery without angina pectoris: Secondary | ICD-10-CM

## 2022-09-25 ENCOUNTER — Other Ambulatory Visit (HOSPITAL_COMMUNITY): Payer: Self-pay

## 2022-09-27 ENCOUNTER — Ambulatory Visit (INDEPENDENT_AMBULATORY_CARE_PROVIDER_SITE_OTHER): Payer: Commercial Managed Care - PPO | Admitting: Family

## 2022-09-27 ENCOUNTER — Other Ambulatory Visit (HOSPITAL_COMMUNITY): Payer: Self-pay

## 2022-09-27 ENCOUNTER — Encounter: Payer: Self-pay | Admitting: Family

## 2022-09-27 ENCOUNTER — Other Ambulatory Visit: Payer: Self-pay

## 2022-09-27 VITALS — BP 127/79 | HR 72 | Temp 97.3°F | Resp 20 | Ht 63.0 in

## 2022-09-27 DIAGNOSIS — R300 Dysuria: Secondary | ICD-10-CM

## 2022-09-27 DIAGNOSIS — N3001 Acute cystitis with hematuria: Secondary | ICD-10-CM

## 2022-09-27 LAB — URINALYSIS, COMPLETE
Bilirubin, UA: NEGATIVE
Glucose, UA: NEGATIVE
Ketones, UA: NEGATIVE
Nitrite, UA: POSITIVE — AB
Specific Gravity, UA: 1.025 (ref 1.005–1.030)
Urobilinogen, Ur: 0.2 mg/dL (ref 0.2–1.0)
pH, UA: 6 (ref 5.0–7.5)

## 2022-09-27 LAB — MICROSCOPIC EXAMINATION
Epithelial Cells (non renal): NONE SEEN /hpf (ref 0–10)
RBC, Urine: >30 /hpf — AB (ref 0–2)
Renal Epithel, UA: NONE SEEN /hpf
WBC, UA: >30 /hpf — AB (ref 0–5)

## 2022-09-27 MED ORDER — CEPHALEXIN 500 MG PO CAPS: 500.0000 mg | ORAL_CAPSULE | Freq: Two times a day (BID) | ORAL | 0 refills | Status: DC

## 2022-09-27 MED ORDER — PHENAZOPYRIDINE HCL 100 MG PO TABS: 100.0000 mg | ORAL_TABLET | Freq: Three times a day (TID) | ORAL | 1 refills | Status: DC | PRN

## 2022-09-27 MED ORDER — REPATHA SURECLICK 140 MG/ML ~~LOC~~ SOAJ
140.0000 mg | SUBCUTANEOUS | 5 refills | Status: DC
  Filled 2022-09-27: qty 2, 28d supply, fill #0
  Filled 2022-11-16: qty 2, 28d supply, fill #1
  Filled 2022-12-14: qty 2, 28d supply, fill #2
  Filled 2023-01-27: qty 2, 28d supply, fill #3
  Filled 2023-02-23: qty 2, 28d supply, fill #4
  Filled 2023-03-20: qty 2, 28d supply, fill #5

## 2022-09-27 NOTE — Addendum Note (Signed)
Addended by: [name] A on: 09/27/2022 09:36 AM   Modules accepted: Orders

## 2022-09-27 NOTE — Progress Notes (Signed)
   Subjective:    Patient ID: Heather Lam, female    DOB: 01/03/1961, 63 y.o.   MRN: [id number]  No chief complaint on file.   Dysuria  This is a new problem. The current episode started in the past 7 days. The problem occurs intermittently. The problem has been waxing and waning. The quality of the pain is described as burning. The pain is at a severity of 5/10. Associated symptoms include flank pain, frequency, hesitancy and urgency. Pertinent negatives include no hematuria, nausea or vomiting. She has tried increased fluids for the symptoms. The treatment provided mild relief.      Review of Systems  Gastrointestinal:  Negative for nausea and vomiting.  Genitourinary:  Positive for dysuria, flank pain, frequency, hesitancy and urgency. Negative for hematuria.  All other systems reviewed and are negative.      Objective:   Physical Exam Vitals reviewed.  Constitutional:      General: She is not in acute distress.    Appearance: She is well-developed.  HENT:     Head: Normocephalic and atraumatic.  Eyes:     Pupils: Pupils are equal, round, and reactive to light.  Neck:     Thyroid: No thyromegaly.  Cardiovascular:     Rate and Rhythm: Normal rate and regular rhythm.     Heart sounds: Normal heart sounds. No murmur heard. Pulmonary:     Effort: Pulmonary effort is normal. No respiratory distress.     Breath sounds: Normal breath sounds. No wheezing.  Abdominal:     General: Bowel sounds are normal. There is no distension.     Palpations: Abdomen is soft.     Tenderness: There is no abdominal tenderness.  Musculoskeletal:        General: No tenderness. Normal range of motion.     Cervical back: Normal range of motion and neck supple.  Skin:    General: Skin is warm and dry.  Neurological:     Mental Status: She is alert and oriented to person, place, and time.     Cranial Nerves: No cranial nerve deficit.     Deep Tendon Reflexes: Reflexes are normal and symmetric.   Psychiatric:        Behavior: Behavior normal.        Thought Content: Thought content normal.        Judgment: Judgment normal.       BP 127/79   Pulse 72   Temp (!) 97.3 F (36.3 C) (Temporal)   Resp 20   Ht 5' 3"$  (1.6 m)   LMP 12/31/2013   SpO2 94%   BMI 34.33 kg/m      Assessment & Plan:  Heather Lam comes in today with chief complaint of No chief complaint on file.   Diagnosis and orders addressed:  1. Dysuria - Urinalysis, Complete - Urine Culture  2. Acute cystitis with hematuria Force fluids AZO over the counter X2 days Follow up if symptoms worsen or does not improve  Culture pending - cephALEXin (KEFLEX) 500 MG capsule; Take 1 capsule (500 mg total) by mouth 2 (two) times daily.  Dispense: 14 capsule; Refill: 0    [name], FNP

## 2022-09-29 ENCOUNTER — Other Ambulatory Visit (HOSPITAL_COMMUNITY): Payer: Self-pay

## 2022-09-29 LAB — URINE CULTURE

## 2022-10-13 ENCOUNTER — Telehealth: Payer: Self-pay | Admitting: Pharmacist

## 2022-10-13 ENCOUNTER — Other Ambulatory Visit: Payer: Self-pay

## 2022-10-13 ENCOUNTER — Other Ambulatory Visit (HOSPITAL_COMMUNITY): Payer: Self-pay

## 2022-10-13 MED ORDER — ZEPBOUND 10 MG/0.5ML ~~LOC~~ SOAJ
10.0000 mg | SUBCUTANEOUS | 2 refills | Status: DC
  Filled 2022-10-13 – 2022-10-25 (×4): qty 2, 28d supply, fill #0

## 2022-10-13 NOTE — Telephone Encounter (Signed)
Dose increased to '10mg'$  weekly Patient tolerating well Denies personal and family history of Medullary thyroid cancer (MTC)

## 2022-10-15 ENCOUNTER — Other Ambulatory Visit (HOSPITAL_COMMUNITY): Payer: Self-pay

## 2022-10-19 ENCOUNTER — Other Ambulatory Visit (HOSPITAL_COMMUNITY): Payer: Self-pay

## 2022-10-22 ENCOUNTER — Other Ambulatory Visit (HOSPITAL_COMMUNITY): Payer: Self-pay

## 2022-10-25 ENCOUNTER — Other Ambulatory Visit: Payer: Self-pay

## 2022-10-25 ENCOUNTER — Other Ambulatory Visit (HOSPITAL_COMMUNITY): Payer: Self-pay

## 2022-10-26 ENCOUNTER — Other Ambulatory Visit (HOSPITAL_COMMUNITY): Payer: Self-pay

## 2022-10-28 ENCOUNTER — Other Ambulatory Visit (HOSPITAL_COMMUNITY): Payer: Self-pay

## 2022-11-02 ENCOUNTER — Other Ambulatory Visit: Payer: Self-pay

## 2022-11-09 ENCOUNTER — Other Ambulatory Visit: Payer: Self-pay

## 2022-11-09 ENCOUNTER — Other Ambulatory Visit: Payer: Self-pay | Admitting: Nurse Practitioner

## 2022-11-09 MED ORDER — ZEPBOUND 12.5 MG/0.5ML ~~LOC~~ SOAJ
12.5000 mg | SUBCUTANEOUS | 3 refills | Status: DC
  Filled 2022-11-09 – 2022-11-10 (×2): qty 2, 28d supply, fill #0

## 2022-11-09 NOTE — Progress Notes (Signed)
Meds ordered this encounter  Medications   tirzepatide (ZEPBOUND) 12.5 MG/0.5ML Pen    Sig: Inject 12.5 mg into the skin once a week.    Dispense:  2 mL    Refill:  3    Order Specific Question:   Supervising Provider    Answer:   Nils Pyle [[id number]]   Mary-Margaret Daphine Deutscher, FNP

## 2022-11-10 ENCOUNTER — Other Ambulatory Visit: Payer: Self-pay

## 2022-11-16 ENCOUNTER — Other Ambulatory Visit: Payer: Self-pay

## 2022-11-29 ENCOUNTER — Encounter: Payer: Self-pay | Admitting: Family

## 2022-11-29 ENCOUNTER — Telehealth: Payer: Commercial Managed Care - PPO | Admitting: Family

## 2022-11-29 DIAGNOSIS — R399 Unspecified symptoms and signs involving the genitourinary system: Secondary | ICD-10-CM | POA: Diagnosis not present

## 2022-11-29 DIAGNOSIS — R300 Dysuria: Secondary | ICD-10-CM

## 2022-11-29 LAB — MICROSCOPIC EXAMINATION
Renal Epithel, UA: NONE SEEN /hpf
WBC, UA: >30 /hpf — AB (ref 0–5)

## 2022-11-29 LAB — URINALYSIS, COMPLETE
Bilirubin, UA: NEGATIVE
Glucose, UA: NEGATIVE
Ketones, UA: NEGATIVE
Nitrite, UA: NEGATIVE
Specific Gravity, UA: 1.025 (ref 1.005–1.030)
Urobilinogen, Ur: 0.2 mg/dL (ref 0.2–1.0)
pH, UA: 6 (ref 5.0–7.5)

## 2022-11-29 MED ORDER — CEPHALEXIN 500 MG PO CAPS
500.0000 mg | ORAL_CAPSULE | Freq: Two times a day (BID) | ORAL | 0 refills | Status: DC
Start: 2022-11-29 — End: 2023-01-04

## 2022-11-29 NOTE — Progress Notes (Signed)
Virtual Visit Consent   Heather Lam, you are scheduled for a virtual visit with a [facility] provider today. Just as with appointments in the office, your consent must be obtained to participate. Your consent will be active for this visit and any virtual visit you may have with one of our providers in the next 365 days. If you have a MyChart account, a copy of this consent can be sent to you electronically.  As this is a virtual visit, video technology does not allow for your provider to perform a traditional examination. This may limit your provider's ability to fully assess your condition. If your provider identifies any concerns that need to be evaluated in person or the need to arrange testing (such as labs, EKG, etc.), we will make arrangements to do so. Although advances in technology are sophisticated, we cannot ensure that it will always work on either your end or our end. If the connection with a video visit is poor, the visit may have to be switched to a telephone visit. With either a video or telephone visit, we are not always able to ensure that we have a secure connection.  By engaging in this virtual visit, you consent to the provision of healthcare and authorize for your insurance to be billed (if applicable) for the services provided during this visit. Depending on your insurance coverage, you may receive a charge related to this service.  I need to obtain your verbal consent now. Are you willing to proceed with your visit today? [name] has provided verbal consent on 11/29/2022 for a virtual visit (video or telephone). Heather Rodney, FNP  Date: 11/29/2022 11:12 AM  Virtual Visit via Video Note   I, Heather Lam, connected with  Heather Lam  ([id number], 09/07/1960) on 11/29/22 at  4:30 PM EDT by a video-enabled telemedicine application and verified that I am speaking with the correct person using two identifiers.  Location: Patient: Virtual Visit Location Patient:  Other: work Provider: Pharmacist, community: Office/Clinic   I discussed the limitations of evaluation and management by telemedicine and the availability of in person appointments. The patient expressed understanding and agreed to proceed.    History of Present Illness: Heather Lam is a 62 y.o. who identifies as a female who was assigned female at birth, and is being seen today for dysuria that started yesterday.  HPI: Dysuria  This is a new problem. The current episode started in the past 7 days. The problem occurs intermittently. The problem has been waxing and waning. The quality of the pain is described as burning. The pain is at a severity of 3/10. The pain is mild. There has been no fever. Associated symptoms include flank pain, frequency, hesitancy and urgency. Pertinent negatives include no hematuria, nausea or vomiting. She has tried increased fluids for the symptoms. The treatment provided mild relief.    Problems:  Patient Active Problem List   Diagnosis Date Noted   OA (osteoarthritis) of hip 10/26/2021   Osteoarthritis of left hip 10/26/2021   S/P hernia surgery 10/10/2020   Morbid obesity (HCC)    History of colostomy reversal 07/30/2019   Cyst of ovary 02/20/2019   Kidney stone 02/20/2019   Migraine 02/20/2019   Diverticulitis 01/27/2019   Actinic keratoses 05/04/2017   History of MI (myocardial infarction) 04/01/2017   Prediabetes 09/28/2016   Vitamin D deficiency 08/16/2016   Recurrent major depressive disorder, in partial remission (HCC) 05/25/2016   Diverticular disease 05/04/2016  Malignant neoplasm of skin 05/04/2016   Carpal tunnel syndrome 02/26/2016   Obesity (BMI 30-39.9) 02/25/2016   Insomnia 07/01/2015   Essential hypertension 05/21/2015   Generalized anxiety disorder 06/06/2013   CAD S/P percutaneous coronary angioplasty 09/13/2011   Antiplatelet or antithrombotic long-term use 09/13/2011   OSA on CPAP 09/13/2011   Hyperlipidemia  09/13/2011    Allergies:  Allergies  Allergen Reactions   Ciprofloxacin Itching   Medications:  Current Outpatient Medications:    tirzepatide (ZEPBOUND) 12.5 MG/0.5ML Pen, Inject 12.5 mg into the skin once a week., Disp: 2 mL, Rfl: 3   Ascorbic Acid (VITAMIN C) 100 MG tablet, Take 100 mg by mouth daily., Disp: , Rfl:    Aspirin 81 MG CAPS, Take 1 capsule by mouth daily at 6 (six) AM., Disp: , Rfl:    Calcium 500 MG tablet, Take 1 tablet by mouth daily at 6 (six) AM., Disp: , Rfl:    cephALEXin (KEFLEX) 500 MG capsule, Take 1 capsule (500 mg total) by mouth 2 (two) times daily., Disp: 14 capsule, Rfl: 0   Cholecalciferol (VITAMIN D) 125 MCG (5000 UT) CAPS, Take 1 capsule by mouth daily at 6 (six) AM., Disp: , Rfl:    docusate sodium (COLACE) 100 MG capsule, Take 200 mg by mouth in the morning., Disp: , Rfl:    DULoxetine (CYMBALTA) 30 MG capsule, Take 1 capsule (30 mg total) by mouth daily., Disp: 90 capsule, Rfl: 3   Evolocumab (REPATHA SURECLICK) 140 MG/ML SOAJ, Inject 140 mg into the skin every 14 days., Disp: 2 mL, Rfl: 5   losartan-hydrochlorothiazide (HYZAAR) 100-25 MG tablet, TAKE 1 TABLET BY MOUTH DAILY., Disp: 90 tablet, Rfl: 3   metoprolol tartrate (LOPRESSOR) 25 MG tablet, Take 1 tablet (25 mg total) by mouth 2 (two) times daily., Disp: 180 tablet, Rfl: 3   nitroGLYCERIN (NITROSTAT) 0.4 MG SL tablet, DISSOLVE 1 TABLET UNDER TONGUE EVERY 5 MINUTES UP TO 3 TIMES FOR CHEST PAIN THEN CALL DR IF NO RELIEF, Disp: 25 tablet, Rfl: 3   phenazopyridine (PYRIDIUM) 100 MG tablet, Take 1 tablet (100 mg total) by mouth 3 (three) times daily as needed for pain., Disp: 20 tablet, Rfl: 1   traZODone (DESYREL) 50 MG tablet, TAKE 1 TO 2 TABLETS BY MOUTH AT BEDTIME AS NEEDED FOR SLEEP, Disp: 90 tablet, Rfl: 3   Zinc 100 MG TABS, Take 1 tablet by mouth daily at 6 (six) AM., Disp: , Rfl:   Observations/Objective: Patient is well-developed, well-nourished in no acute distress.  Resting comfortably    Head is normocephalic, atraumatic.  No labored breathing.  Speech is clear and coherent with logical content.  Patient is alert and oriented at baseline.   Assessment and Plan: 1. Dysuria - Urinalysis, Complete - Urine Culture  Force fluids AZO over the counter X2 days Follow up if symptoms worsen or do not improve  Culture pending  Follow Up Instructions: I discussed the assessment and treatment plan with the patient. The patient was provided an opportunity to ask questions and all were answered. The patient agreed with the plan and demonstrated an understanding of the instructions.  A copy of instructions were sent to the patient via MyChart unless otherwise noted below.    The patient was advised to call back or seek an in-person evaluation if the symptoms worsen or if the condition fails to improve as anticipated.  Time:  I spent 7 minutes with the patient via telehealth technology discussing the above problems/concerns.  [name], FNP

## 2022-12-02 LAB — URINE CULTURE

## 2022-12-07 ENCOUNTER — Other Ambulatory Visit: Payer: Self-pay

## 2022-12-14 ENCOUNTER — Other Ambulatory Visit (HOSPITAL_COMMUNITY): Payer: Self-pay

## 2023-01-04 ENCOUNTER — Ambulatory Visit (INDEPENDENT_AMBULATORY_CARE_PROVIDER_SITE_OTHER): Payer: Commercial Managed Care - PPO | Admitting: Family Medicine

## 2023-01-04 ENCOUNTER — Encounter: Payer: Self-pay | Admitting: Family Medicine

## 2023-01-04 ENCOUNTER — Ambulatory Visit (INDEPENDENT_AMBULATORY_CARE_PROVIDER_SITE_OTHER): Payer: Commercial Managed Care - PPO

## 2023-01-04 VITALS — BP 132/72 | HR 73 | Temp 97.8°F | Ht 63.0 in | Wt 180.0 lb

## 2023-01-04 DIAGNOSIS — M79641 Pain in right hand: Secondary | ICD-10-CM | POA: Diagnosis not present

## 2023-01-04 DIAGNOSIS — R5382 Chronic fatigue, unspecified: Secondary | ICD-10-CM

## 2023-01-04 DIAGNOSIS — M130 Polyarthritis, unspecified: Secondary | ICD-10-CM

## 2023-01-04 DIAGNOSIS — I252 Old myocardial infarction: Secondary | ICD-10-CM

## 2023-01-04 DIAGNOSIS — L304 Erythema intertrigo: Secondary | ICD-10-CM

## 2023-01-04 DIAGNOSIS — W57XXXA Bitten or stung by nonvenomous insect and other nonvenomous arthropods, initial encounter: Secondary | ICD-10-CM

## 2023-01-04 DIAGNOSIS — M79642 Pain in left hand: Secondary | ICD-10-CM

## 2023-01-04 DIAGNOSIS — M7989 Other specified soft tissue disorders: Secondary | ICD-10-CM | POA: Diagnosis not present

## 2023-01-04 MED ORDER — KETOCONAZOLE 2 % EX CREA
1.0000 | TOPICAL_CREAM | Freq: Every day | CUTANEOUS | 0 refills | Status: DC
Start: 2023-01-04 — End: 2023-05-16

## 2023-01-04 MED ORDER — HYDROCODONE-ACETAMINOPHEN 5-325 MG PO TABS
1.0000 | ORAL_TABLET | Freq: Four times a day (QID) | ORAL | 0 refills | Status: DC | PRN
Start: 2023-01-04 — End: 2023-07-12

## 2023-01-04 MED ORDER — PREDNISONE 10 MG (21) PO TBPK
ORAL_TABLET | ORAL | 0 refills | Status: DC
Start: 2023-01-04 — End: 2023-03-17

## 2023-01-04 NOTE — Addendum Note (Signed)
Addended by: Raliegh Ip on: 01/04/2023 10:31 AM   Modules accepted: Orders

## 2023-01-04 NOTE — Addendum Note (Signed)
Addended by: Raliegh Ip on: 01/04/2023 09:49 AM   Modules accepted: Orders

## 2023-01-04 NOTE — Progress Notes (Addendum)
Subjective: CC: Right hip pain PCP: Raliegh Ip, DO [id number] K Wardell is a 62 y.o. female presenting to clinic today for:  1.  Right hip pain Patient with ongoing right hip pain.  This been present for the last several months.  She has history of total left hip surgery with Dr Silvano Rusk last year.  They told her at that time she would likely need right hip done.  She reports of polyarthralgia through bilateral hands, wrist, elbows, shoulder and upper back.  She has had autoimmune workup last year with normal ANA, RF, ESR and CRP.  No known autoimmune disease in the family but her sister is currently being worked up for lupus.  She reports chronic fatigue but no other autoimmune type symptoms.  She admits that she does not use her CPAP regularly as she is supposed to.  Has history of MI so tries to avoid use of NSAIDs but has used 800+ milligrams of ibuprofen at a time in efforts to alleviate pain.  When she has been on oral steroids in the past this is always alleviated pain.  She is getting desperate enough that she is strongly considering more advanced pain medication but is very scared to do this because she does not want to be dependent on anything.  Reports having had a tick bite a few months ago but not sure if related.  2. Yeast rash She reports that she gets a yeast rash at the incision site of where she had her appendix removed.  It is because the skin folds over and collect moisture there.  She tries to keep it as dry as possible but is a little erythematous and irritated today and would like to go back on a cream for this   ROS: Per HPI  Allergies  Allergen Reactions   Ciprofloxacin Itching   Past Medical History:  Diagnosis Date   Arthritis    Back pain    CAD (coronary artery disease)    Chest pain    Complication of anesthesia    Constipation    Coronary arteriosclerosis 05/04/2016   Diverticulitis    Diverticulosis    Family history of adverse reaction to  anesthesia    Mom PONV   Heart attack (HCC) 2010   History of carpal tunnel syndrome    Bilateral   History of kidney stones    Hyperlipidemia    Hypertension    Intra-abdominal abscess (HCC)    Joint pain    Morbid obesity (HCC)    Myocardial infarction (HCC) 05/04/2009   PONV (postoperative nausea and vomiting)    Pre-diabetes    Skin cancer    on nose   Sleep apnea    not using cpap at this time   Vitamin D deficiency     Current Outpatient Medications:    Aspirin 81 MG CAPS, Take 1 capsule by mouth daily at 6 (six) AM., Disp: , Rfl:    Calcium 500 MG tablet, Take 1 tablet by mouth daily at 6 (six) AM., Disp: , Rfl:    Cholecalciferol (VITAMIN D) 125 MCG (5000 UT) CAPS, Take 1 capsule by mouth daily at 6 (six) AM., Disp: , Rfl:    DULoxetine (CYMBALTA) 30 MG capsule, Take 1 capsule (30 mg total) by mouth daily., Disp: 90 capsule, Rfl: 3   Evolocumab (REPATHA SURECLICK) 140 MG/ML SOAJ, Inject 140 mg into the skin every 14 days., Disp: 2 mL, Rfl: 5   losartan-hydrochlorothiazide (HYZAAR) 100-25 MG tablet, TAKE  1 TABLET BY MOUTH DAILY., Disp: 90 tablet, Rfl: 3   metoprolol tartrate (LOPRESSOR) 25 MG tablet, Take 1 tablet (25 mg total) by mouth 2 (two) times daily., Disp: 180 tablet, Rfl: 3   nitroGLYCERIN (NITROSTAT) 0.4 MG SL tablet, DISSOLVE 1 TABLET UNDER TONGUE EVERY 5 MINUTES UP TO 3 TIMES FOR CHEST PAIN THEN CALL DR IF NO RELIEF, Disp: 25 tablet, Rfl: 3   traZODone (DESYREL) 50 MG tablet, TAKE 1 TO 2 TABLETS BY MOUTH AT BEDTIME AS NEEDED FOR SLEEP, Disp: 90 tablet, Rfl: 3 Social History   Socioeconomic History   Marital status: Divorced    Spouse name: Not on file   Number of children: Not on file   Years of education: 14   Highest education level: Associate degree: occupational, Scientist, product/process development, or vocational program  Occupational History   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[employer]    Comment: Adult nurse at [employer]  Tobacco Use   Smoking status:  Former    Packs/day: 1.00    Years: 20.00    Additional pack years: 0.00    Total pack years: 20.00    Types: Cigarettes    Quit date: 2010    Years since quitting: 14.4   Smokeless tobacco: Never  Vaping Use   Vaping Use: Never used  Substance and Sexual Activity   Alcohol use: Not Currently    Comment: Rare    Drug use: No   Sexual activity: Yes    Birth control/protection: Post-menopausal  Other Topics Concern   Not on file  Social History Narrative   Caffeine daily    Right handed   One story home   Social Determinants of Health   Financial Resource Strain: Low Risk  (12/31/2022)   Overall Financial Resource Strain (CARDIA)    Difficulty of Paying Living Expenses: Not hard at all  Food Insecurity: No Food Insecurity (12/31/2022)   Hunger Vital Sign    Worried About Running Out of Food in the Last Year: Never true    Ran Out of Food in the Last Year: Never true  Transportation Needs: No Transportation Needs (12/31/2022)   PRAPARE - Administrator, Civil Service (Medical): No    Lack of Transportation (Non-Medical): No  Physical Activity: Insufficiently Active (12/31/2022)   Exercise Vital Sign    Days of Exercise per Week: 3 days    Minutes of Exercise per Session: 30 min  Stress: No Stress Concern Present (12/31/2022)   [employer] of Occupational Health - Occupational Stress Questionnaire    Feeling of Stress : Not at all  Social Connections: Moderately Integrated (12/31/2022)   Social Connection and Isolation Panel [NHANES]    Frequency of Communication with Friends and Family: More than three times a week    Frequency of Social Gatherings with Friends and Family: More than three times a week    Attends Religious Services: More than 4 times per year    Active Member of [employer] or Organizations: Yes    Attends Banker Meetings: 1 to 4 times per year    Marital Status: Divorced  Catering manager Violence: Not on file   Family History   Problem Relation Age of Onset   Hyperlipidemia Mother    Hypertension Mother    Cancer Father        skin   Hyperlipidemia Father    Dementia Father    Arthritis Father    Hypertension Father    Sleep  apnea Father    Other Sister        Myelofibrosis   Arthritis Sister    Hyperlipidemia Sister    Arthritis Sister    Hyperlipidemia Sister    Diabetes Paternal Grandfather    Colon cancer Neg Hx     Objective: Office vital signs reviewed. BP 132/72   Pulse 73   Temp 97.8 F (36.6 C)   Ht 5\' 3"  (1.6 m)   Wt 180 lb (81.6 kg)   LMP 12/31/2013   SpO2 98%   BMI 31.89 kg/m   Physical Examination:  General: Awake, alert, well nourished, No acute distress HEENT: sclera white, MMM Cardio: regular rate and rhythm Pulm: Normal work of breathing on room air MSK: Joint inflammation noted throughout the PIP and DIP joints of bilateral hands.  She has difficulty forming fists bilaterally secondary to hand swelling.  She is ambulating independently  Assessment/ Plan: 62 y.o. female   Polyarthritis - Plan: ANA Comprehensive Panel, RheumAssure, CBC, CMP14+EGFR, XR Hand 2 View Left, XR Hand 2 View Right, predniSONE (STERAPRED UNI-PAK 21 TAB) 10 MG (21) TBPK tablet, HYDROcodone-acetaminophen (NORCO) 5-325 MG tablet, Lyme Disease Serology w/Reflex, Alpha-Gal Panel  History of MI (myocardial infarction) - Plan: Lipid panel, CMP14+EGFR  Chronic fatigue - Plan: TSH, T4, free, Lyme Disease Serology w/Reflex, Alpha-Gal Panel  Intertrigo - Plan: ketoconazole (NIZORAL) 2 % cream  Tick bite, unspecified site, initial encounter - Plan: Lyme Disease Serology w/Reflex, Alpha-Gal Panel  I wonder if we can get away with giving her Naprosyn to take as needed.  She does have history of MI and of course we have to be very mindful of that.  She has yet to establish with a new cardiologist so will wait until she does establish care with them and then we can revisit this idea.  For now a pocket  prescription for prednisone has been sent to her pharmacy.  I am giving her Norco to have on hand for absolutely severe pain.  I am going to try and do a bit more comprehensive screening for autoimmune she has visible joint swelling and tenderness on exam.  Plain films of bilateral hands to look for erosions etc. also ordered.  For her right hip, I have recommended that she follow-up with Dr. Lequita Halt if needed as there was expected need for surgical intervention at some point  I will plan for UDS and controlled substance contract if she in fact ends up needing the opioid medication long-term  The Narcotic Database has been reviewed.  There were no red flags.    Ketoconazole sent to pharmacy for intertrigo  Lyme panel and alpha gal panel also added given tick bite.  No orders of the defined types were placed in this encounter.  No orders of the defined types were placed in this encounter.    Raliegh Ip, DO [facility] [phone]

## 2023-01-05 ENCOUNTER — Other Ambulatory Visit: Payer: Self-pay

## 2023-01-05 DIAGNOSIS — R5382 Chronic fatigue, unspecified: Secondary | ICD-10-CM

## 2023-01-05 DIAGNOSIS — M130 Polyarthritis, unspecified: Secondary | ICD-10-CM

## 2023-01-05 DIAGNOSIS — W57XXXA Bitten or stung by nonvenomous insect and other nonvenomous arthropods, initial encounter: Secondary | ICD-10-CM

## 2023-01-05 DIAGNOSIS — I252 Old myocardial infarction: Secondary | ICD-10-CM

## 2023-01-05 LAB — CMP14+EGFR
ALT: 25 IU/L (ref 0–32)
Bilirubin Total: 0.2 mg/dL (ref 0.0–1.2)

## 2023-01-05 LAB — ALPHA-GAL PANEL

## 2023-01-05 LAB — RHEUMASSURE

## 2023-01-05 LAB — ANA COMPREHENSIVE PANEL

## 2023-01-05 LAB — CBC: MCH: 27.6 pg (ref 26.6–33.0)

## 2023-01-06 LAB — CMP14+EGFR
Albumin: 4.2 g/dL (ref 3.9–4.9)
BUN/Creatinine Ratio: 26 (ref 12–28)
BUN: 17 mg/dL (ref 8–27)
Glucose: 118 mg/dL — ABNORMAL HIGH (ref 70–99)

## 2023-01-06 LAB — ANA COMPREHENSIVE PANEL
Centromere Ab Screen: <0.2 AI (ref 0.0–0.9)
ENA RNP Ab: <0.2 AI (ref 0.0–0.9)
Scleroderma (Scl-70) (ENA) Antibody, IgG: <0.2 AI (ref 0.0–0.9)
dsDNA Ab: <1 IU/mL (ref 0–9)

## 2023-01-06 LAB — RHEUMASSURE

## 2023-01-06 LAB — T4, FREE: Free T4: 0.89 ng/dL (ref 0.82–1.77)

## 2023-01-06 LAB — CBC: Hematocrit: 40.1 % (ref 34.0–46.6)

## 2023-01-08 LAB — CMP14+EGFR
Alkaline Phosphatase: 78 IU/L (ref 44–121)
Globulin, Total: 2.5 g/dL (ref 1.5–4.5)
Total Protein: 6.7 g/dL (ref 6.0–8.5)

## 2023-01-08 LAB — TSH: TSH: 1.54 u[IU]/mL (ref 0.450–4.500)

## 2023-01-11 LAB — CMP14+EGFR
AST: 24 IU/L (ref 0–40)
Albumin/Globulin Ratio: 1.7 (ref 1.2–2.2)
Chloride: 102 mmol/L (ref 96–106)

## 2023-01-11 LAB — ALPHA-GAL PANEL
Beef IgE: <0.1 kU/L
IgE (Immunoglobulin E), Serum: 53 IU/mL (ref 6–495)

## 2023-01-11 LAB — ANA COMPREHENSIVE PANEL: Anti JO-1: <0.2 AI (ref 0.0–0.9)

## 2023-01-11 LAB — CBC: RDW: 14.1 % (ref 11.7–15.4)

## 2023-01-11 LAB — RHEUMASSURE

## 2023-01-12 LAB — ANA COMPREHENSIVE PANEL
Chromatin Ab SerPl-aCnc: <0.2 AI (ref 0.0–0.9)
ENA SM Ab Ser-aCnc: <0.2 AI (ref 0.0–0.9)
ENA SSA (RO) Ab: <0.2 AI (ref 0.0–0.9)

## 2023-01-12 LAB — CMP14+EGFR
CO2: 24 mmol/L (ref 20–29)
Calcium: 9.5 mg/dL (ref 8.7–10.3)
Creatinine, Ser: 0.65 mg/dL (ref 0.57–1.00)
Potassium: 4 mmol/L (ref 3.5–5.2)
Sodium: 140 mmol/L (ref 134–144)
eGFR: 99 mL/min/{1.73_m2} (ref >59)

## 2023-01-12 LAB — ALPHA-GAL PANEL
Allergen Lamb IgE: <0.1 kU/L
Pork IgE: <0.1 kU/L

## 2023-01-12 LAB — CBC
Hemoglobin: 13.3 g/dL (ref 11.1–15.9)
MCHC: 33.2 g/dL (ref 31.5–35.7)
MCV: 83 fL (ref 79–97)
Platelets: 233 10*3/uL (ref 150–450)
RBC: 4.82 x10E6/uL (ref 3.77–5.28)
WBC: 7.8 10*3/uL (ref 3.4–10.8)

## 2023-01-12 LAB — LYME DISEASE SEROLOGY W/REFLEX: Lyme Total Antibody EIA: NEGATIVE

## 2023-01-20 ENCOUNTER — Other Ambulatory Visit: Payer: Self-pay | Admitting: Family Medicine

## 2023-01-20 DIAGNOSIS — F411 Generalized anxiety disorder: Secondary | ICD-10-CM

## 2023-01-21 ENCOUNTER — Other Ambulatory Visit: Payer: Self-pay

## 2023-01-21 ENCOUNTER — Other Ambulatory Visit (HOSPITAL_COMMUNITY): Payer: Self-pay

## 2023-01-21 MED ORDER — DULOXETINE HCL 30 MG PO CPEP
30.0000 mg | ORAL_CAPSULE | Freq: Every day | ORAL | 0 refills | Status: DC
Start: 2023-01-21 — End: 2023-04-21
  Filled 2023-01-21: qty 90, 90d supply, fill #0

## 2023-01-27 ENCOUNTER — Other Ambulatory Visit (HOSPITAL_COMMUNITY): Payer: Self-pay

## 2023-01-27 ENCOUNTER — Other Ambulatory Visit: Payer: Self-pay

## 2023-02-15 ENCOUNTER — Other Ambulatory Visit: Payer: Self-pay

## 2023-02-15 ENCOUNTER — Other Ambulatory Visit (HOSPITAL_COMMUNITY): Payer: Self-pay

## 2023-02-15 ENCOUNTER — Other Ambulatory Visit: Payer: Self-pay | Admitting: Pharmacist

## 2023-02-15 DIAGNOSIS — I2510 Atherosclerotic heart disease of native coronary artery without angina pectoris: Secondary | ICD-10-CM

## 2023-02-15 DIAGNOSIS — I252 Old myocardial infarction: Secondary | ICD-10-CM

## 2023-02-15 MED ORDER — WEGOVY 1 MG/0.5ML ~~LOC~~ SOAJ
1.0000 mg | SUBCUTANEOUS | 3 refills | Status: DC
Start: 2023-02-15 — End: 2023-02-16
  Filled 2023-02-15: qty 2, fill #0

## 2023-02-15 MED ORDER — ONDANSETRON 4 MG PO TBDP
4.0000 mg | ORAL_TABLET | Freq: Three times a day (TID) | ORAL | 0 refills | Status: DC | PRN
  Filled 2023-02-15: qty 20, 7d supply, fill #0

## 2023-02-15 NOTE — Progress Notes (Unsigned)
  Wegovy in h/o MI obesity

## 2023-02-16 ENCOUNTER — Other Ambulatory Visit (HOSPITAL_COMMUNITY): Payer: Self-pay

## 2023-02-16 ENCOUNTER — Telehealth: Payer: Self-pay | Admitting: Pharmacist

## 2023-02-16 ENCOUNTER — Encounter: Payer: Self-pay | Admitting: Pharmacist

## 2023-02-16 NOTE — Progress Notes (Signed)
PA FOR WEGOVY SUBMITED FOR PATIENT BASED ON HISTORY OF MI, CAD, HLD, HTN, OBESITY  DENIED BY INSURANCE   PATIENT MADE AWARE

## 2023-02-16 NOTE — Progress Notes (Signed)
Error - disregard encounter

## 2023-02-23 ENCOUNTER — Other Ambulatory Visit (HOSPITAL_COMMUNITY): Payer: Self-pay

## 2023-02-24 ENCOUNTER — Other Ambulatory Visit (HOSPITAL_COMMUNITY): Payer: Self-pay

## 2023-03-15 ENCOUNTER — Encounter: Payer: Self-pay | Admitting: Family Medicine

## 2023-03-15 DIAGNOSIS — M1611 Unilateral primary osteoarthritis, right hip: Secondary | ICD-10-CM

## 2023-03-17 ENCOUNTER — Ambulatory Visit (INDEPENDENT_AMBULATORY_CARE_PROVIDER_SITE_OTHER): Payer: Commercial Managed Care - PPO | Admitting: Family Medicine

## 2023-03-17 ENCOUNTER — Encounter: Payer: Self-pay | Admitting: Family Medicine

## 2023-03-17 VITALS — BP 148/73 | HR 60 | Temp 96.7°F | Ht 63.0 in

## 2023-03-17 DIAGNOSIS — L723 Sebaceous cyst: Secondary | ICD-10-CM

## 2023-03-17 DIAGNOSIS — Z8742 Personal history of other diseases of the female genital tract: Secondary | ICD-10-CM | POA: Diagnosis not present

## 2023-03-17 MED ORDER — FLUCONAZOLE 150 MG PO TABS
ORAL_TABLET | ORAL | 0 refills | Status: DC
Start: 2023-03-17 — End: 2023-05-16

## 2023-03-17 MED ORDER — SULFAMETHOXAZOLE-TRIMETHOPRIM 800-160 MG PO TABS
1.0000 | ORAL_TABLET | Freq: Two times a day (BID) | ORAL | 0 refills | Status: AC
Start: 2023-03-17 — End: 2023-03-24

## 2023-03-17 NOTE — Progress Notes (Signed)
Subjective:  Patient ID: Heather Lam, female    DOB: 10/24/1960, 62 y.o.   MRN: [id number]  Patient Care Team: Raliegh Ip, DO as PCP - General (Family Medicine) Lyn Records, MD (Inactive) as PCP - Cardiology (Cardiology) Danella Maiers, [facility] (Pharmacist)   Chief Complaint:  Cyst   HPI: Heather Lam is a 62 y.o. female presenting on 03/17/2023 for Cyst   Pt presents today for evaluation of an inflamed cyst to the middle of her back. States over the last several days it has become larger and tender. Her bra strap bothers the area and anytime she sits back against a chair it is painful. Has had drainage from the area in the past, not recently.        Relevant past medical, surgical, family, and social history reviewed and updated as indicated.  Allergies and medications reviewed and updated. Data reviewed: Chart in Epic.   Past Medical History:  Diagnosis Date   Arthritis    Back pain    CAD (coronary artery disease)    Chest pain    Complication of anesthesia    Constipation    Coronary arteriosclerosis 05/04/2016   Diverticulitis    Diverticulosis    Family history of adverse reaction to anesthesia    Mom PONV   Heart attack (HCC) 2010   History of carpal tunnel syndrome    Bilateral   History of kidney stones    Hyperlipidemia    Hypertension    Intra-abdominal abscess (HCC)    Joint pain    Morbid obesity (HCC)    Myocardial infarction (HCC) 05/04/2009   PONV (postoperative nausea and vomiting)    Pre-diabetes    Skin cancer    on nose   Sleep apnea    not using cpap at this time   Vitamin D deficiency     Past Surgical History:  Procedure Laterality Date   APPENDECTOMY     APPLICATION OF WOUND VAC N/A 02/09/2019   Procedure: APPLICATION OF WOUND VAC;  Surgeon: Berna Bue, MD;  Location: [facility] OR;  Service: General;  Laterality: N/A;   BACK SURGERY     CARPAL TUNNEL RELEASE Right    CARPAL TUNNEL RELEASE Left 08/13/2016    Procedure: LEFT CARPAL TUNNEL RELEASE;  Surgeon: Dominica Severin, MD;  Location: [facility];  Service: Orthopedics;  Laterality: Left;   COLECTOMY WITH COLOSTOMY CREATION/HARTMANN PROCEDURE N/A 02/09/2019   Procedure: HARTMANN PROCEDURE;  Surgeon: Berna Bue, MD;  Location: [facility] OR;  Service: General;  Laterality: N/A;   COLONOSCOPY     COLOSTOMY N/A 02/09/2019   Procedure: COLOSTOMY;  Surgeon: Berna Bue, MD;  Location: [facility] OR;  Service: General;  Laterality: N/A;   COLOSTOMY REVERSAL N/A 07/30/2019   Procedure: LAPAROSCOPIC ASSISTED REVERSAL OF END COLOSTOMY WITH RIGID PROCTOSCOPY;  Surgeon: Berna Bue, MD;  Location: [facility];  Service: General;  Laterality: N/A;   CORONARY STENT PLACEMENT     2 stents in the same artery   ENDOMETRIAL ABLATION  2005   EXTRACORPOREAL SHOCK WAVE LITHOTRIPSY Right 06/27/2017   Procedure: RIGHT EXTRACORPOREAL SHOCK WAVE LITHOTRIPSY (ESWL);  Surgeon: Marcine Matar, MD;  Location: [facility];  Service: Urology;  Laterality: Right;   INCISIONAL HERNIA REPAIR N/A 10/10/2020   Procedure: LAPAROSCOPIC  WITH TRASITION TO OPEN INCISIONAL HERNIA REPAIR WITH MESH;  Surgeon: Berna Bue, MD;  Location: [facility];  Service: General;  Laterality: N/A;  KNEE ARTHROSCOPY WITH MENISCAL REPAIR Right    MOHS SURGERY     TOTAL HIP ARTHROPLASTY Left 10/26/2021   Procedure: TOTAL HIP ARTHROPLASTY ANTERIOR APPROACH;  Surgeon: Ollen Gross, MD;  Location: [facility];  Service: Orthopedics;  Laterality: Left;   TUBAL LIGATION  1991    Social History   Socioeconomic History   Marital status: Divorced    Spouse name: Not on file   Number of children: Not on file   Years of education: 14   Highest education level: Associate degree: occupational, Scientist, product/process development, or vocational program  Occupational History   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[place]    Comment: Adult nurse at [employer]  Tobacco Use   Smoking status: Former     Current packs/day: 0.00    Average packs/day: 1 pack/day for 20.0 years (20.0 ttl pk-yrs)    Types: Cigarettes    Start date: 58    Quit date: 2010    Years since quitting: 14.6   Smokeless tobacco: Never  Vaping Use   Vaping status: Never Used  Substance and Sexual Activity   Alcohol use: Not Currently    Comment: Rare    Drug use: No   Sexual activity: Yes    Birth control/protection: Post-menopausal  Other Topics Concern   Not on file  Social History Narrative   Caffeine daily    Right handed   One story home   Social Determinants of Health   Financial Resource Strain: Low Risk  (12/31/2022)   Overall Financial Resource Strain (CARDIA)    Difficulty of Paying Living Expenses: Not hard at all  Food Insecurity: No Food Insecurity (12/31/2022)   Hunger Vital Sign    Worried About Running Out of Food in the Last Year: Never true    Ran Out of Food in the Last Year: Never true  Transportation Needs: No Transportation Needs (12/31/2022)   PRAPARE - Administrator, Civil Service (Medical): No    Lack of Transportation (Non-Medical): No  Physical Activity: Insufficiently Active (12/31/2022)   Exercise Vital Sign    Days of Exercise per Week: 3 days    Minutes of Exercise per Session: 30 min  Stress: No Stress Concern Present (12/31/2022)   [employer] of Occupational Health - Occupational Stress Questionnaire    Feeling of Stress : Not at all  Social Connections: Moderately Integrated (12/31/2022)   Social Connection and Isolation Panel [NHANES]    Frequency of Communication with Friends and Family: More than three times a week    Frequency of Social Gatherings with Friends and Family: More than three times a week    Attends Religious Services: More than 4 times per year    Active Member of [employer] or Organizations: Yes    Attends Banker Meetings: 1 to 4 times per year    Marital Status: Divorced  Intimate Partner Violence: Not on file     Outpatient Encounter Medications as of 03/17/2023  Medication Sig   Aspirin 81 MG CAPS Take 1 capsule by mouth daily at 6 (six) AM.   Calcium 500 MG tablet Take 1 tablet by mouth daily at 6 (six) AM.   Cholecalciferol (VITAMIN D) 125 MCG (5000 UT) CAPS Take 1 capsule by mouth daily at 6 (six) AM.   DULoxetine (CYMBALTA) 30 MG capsule Take 1 capsule (30 mg) by mouth daily.   Evolocumab (REPATHA SURECLICK) 140 MG/ML SOAJ Inject 140 mg into the skin every 14 days.  fluconazole (DIFLUCAN) 150 MG tablet 1 po q week x 4 weeks   HYDROcodone-acetaminophen (NORCO) 5-325 MG tablet Take 1 tablet by mouth every 6 (six) hours as needed for moderate pain.   ketoconazole (NIZORAL) 2 % cream Apply 1 Application topically daily. As needed for intertrigo   losartan-hydrochlorothiazide (HYZAAR) 100-25 MG tablet TAKE 1 TABLET BY MOUTH DAILY.   metoprolol tartrate (LOPRESSOR) 25 MG tablet Take 1 tablet (25 mg total) by mouth 2 (two) times daily.   nitroGLYCERIN (NITROSTAT) 0.4 MG SL tablet DISSOLVE 1 TABLET UNDER TONGUE EVERY 5 MINUTES UP TO 3 TIMES FOR CHEST PAIN THEN CALL DR IF NO RELIEF   ondansetron (ZOFRAN-ODT) 4 MG disintegrating tablet Take 1 tablet (4 mg total) by mouth every 8 (eight) hours as needed for nausea or vomiting.   sulfamethoxazole-trimethoprim (BACTRIM DS) 800-160 MG tablet Take 1 tablet by mouth 2 (two) times daily for 7 days.   traZODone (DESYREL) 50 MG tablet TAKE 1 TO 2 TABLETS BY MOUTH AT BEDTIME AS NEEDED FOR SLEEP   [DISCONTINUED] gabapentin (NEURONTIN) 300 MG capsule Take 1 capsule (300 mg total) by mouth every 8 (eight) hours as needed.   [DISCONTINUED] predniSONE (STERAPRED UNI-PAK 21 TAB) 10 MG (21) TBPK tablet As directed x 6 days   No facility-administered encounter medications on file as of 03/17/2023.    Allergies  Allergen Reactions   Ciprofloxacin Itching    Review of Systems  Skin:  Positive for color change. Negative for pallor, rash and wound.       Cyst to back         Objective:  BP (!) 148/73   Pulse 60   Temp (!) 96.7 F (35.9 C) (Temporal)   Ht 5\' 3"  (1.6 m)   LMP 12/31/2013   SpO2 95%   BMI 31.89 kg/m    Wt Readings from Last 3 Encounters:  02/16/23 180 lb (81.6 kg)  01/04/23 180 lb (81.6 kg)  07/19/22 193 lb 12.8 oz (87.9 kg)    Physical Exam Vitals and nursing note reviewed.  Constitutional:      Appearance: Normal appearance.  HENT:     Head: Normocephalic and atraumatic.     Mouth/Throat:     Mouth: Mucous membranes are moist.  Eyes:     Conjunctiva/sclera: Conjunctivae normal.     Pupils: Pupils are equal, round, and reactive to light.  Cardiovascular:     Rate and Rhythm: Normal rate.  Pulmonary:     Effort: Pulmonary effort is normal.  Skin:    General: Skin is warm.     Capillary Refill: Capillary refill takes less than 2 seconds.     Findings: Lesion present.       Neurological:     General: No focal deficit present.     Mental Status: She is alert and oriented to person, place, and time.  Psychiatric:        Mood and Affect: Mood normal.        Behavior: Behavior normal.        Thought Content: Thought content normal.        Judgment: Judgment normal.    I & D  Date/Time: 03/17/2023 4:41 PM  Performed by: Sonny Masters, FNP Authorized by: Sonny Masters, FNP   Consent:    Consent obtained:  Verbal   Consent given by:  Patient   Risks, benefits, and alternatives were discussed: yes     Risks discussed:  Bleeding, incomplete drainage, pain, infection and  damage to other organs   Alternatives discussed:  No treatment, delayed treatment, alternative treatment, observation and referral Universal protocol:    Procedure explained and questions answered to patient or proxy's satisfaction: yes     Relevant documents present and verified: yes     Immediately prior to procedure a time out was called: yes     Patient identity confirmed:  Verbally with patient Location:    Type:  Cyst   Size:  2 cm x  2 cm   Location:  Trunk   Trunk location:  Back Pre-procedure details:    Skin preparation:  Alcohol and povidone-iodine Sedation:    Sedation type:  None Anesthesia:    Anesthesia method:  Local infiltration   Local anesthetic:  Lidocaine 2% WITH epi Procedure type:    Complexity:  Complex Procedure details:    Needle aspiration: no     Incision types:  Single straight   Scalpel blade:  11   Wound management:  Probed and deloculated, irrigated with saline, extensive cleaning and debrided   Drainage:  Purulent and bloody   Drainage amount:  Copious   Wound treatment: steri-strip closure. Post-procedure details:    Procedure completion:  Tolerated well, no immediate complications I & D  Date/Time: 03/17/2023 4:42 PM  Performed by: Sonny Masters, FNP Authorized by: Sonny Masters, FNP   Consent:    Consent obtained:  Verbal   Consent given by:  Patient   Risks, benefits, and alternatives were discussed: yes     Risks discussed:  Bleeding, damage to other organs, infection, incomplete drainage and pain   Alternatives discussed:  Referral, observation, alternative treatment, delayed treatment and no treatment Universal protocol:    Procedure explained and questions answered to patient or proxy's satisfaction: yes     Relevant documents present and verified: yes     Immediately prior to procedure a time out was called: yes     Patient identity confirmed:  Verbally with patient Location:    Type:  Cyst   Size:  0.5 cm x 0.5 cm   Location:  Trunk   Trunk location:  Back Pre-procedure details:    Skin preparation:  Alcohol and povidone-iodine Sedation:    Sedation type:  None Anesthesia:    Anesthesia method:  Local infiltration   Local anesthetic:  Lidocaine 2% WITH epi Procedure type:    Complexity:  Simple Procedure details:    Needle aspiration: no     Incision types:  Single straight   Scalpel blade:  11   Wound management:  Probed and deloculated and irrigated  with saline   Drainage:  Purulent   Drainage amount:  Scant   Wound treatment: steri-strip closure. Post-procedure details:    Procedure completion:  Tolerated well, no immediate complications   Results for orders placed or performed in visit on 01/05/23  Alpha-Gal Panel  Result Value Ref Range   Class Description Allergens Comment    IgE (Immunoglobulin E), Serum 53 6 - 495 IU/mL   O215-IgE Alpha-Gal 1.71 (A) Class III kU/L   Beef IgE <0.10 Class 0 kU/L   Pork IgE <0.10 Class 0 kU/L   Allergen Lamb IgE <0.10 Class 0 kU/L  ANA Comprehensive Panel  Result Value Ref Range   dsDNA Ab <1 0 - 9 IU/mL   ENA RNP Ab <0.2 0.0 - 0.9 AI   ENA SM Ab Ser-aCnc <0.2 0.0 - 0.9 AI   Scleroderma (Scl-70) (ENA) Antibody, IgG <0.2 0.0 -  0.9 AI   ENA SSA (RO) Ab <0.2 0.0 - 0.9 AI   ENA SSB (LA) Ab <0.2 0.0 - 0.9 AI   Chromatin Ab SerPl-aCnc <0.2 0.0 - 0.9 AI   Anti JO-1 <0.2 0.0 - 0.9 AI   Centromere Ab Screen <0.2 0.0 - 0.9 AI   See below: Comment   RheumAssure  Result Value Ref Range   Rheumatoid Arthritis Factor <14.0 Units/mL   CCP Antibodies IgG/IgA <20 Units   14.3.3 ETA, Rheum. Arthritis <0.20 ng/mL  TSH  Result Value Ref Range   TSH 1.540 0.450 - 4.500 uIU/mL  T4, free  Result Value Ref Range   Free T4 0.89 0.82 - 1.77 ng/dL  CBC  Result Value Ref Range   WBC 7.8 3.4 - 10.8 x10E3/uL   RBC 4.82 3.77 - 5.28 x10E6/uL   Hemoglobin 13.3 11.1 - 15.9 g/dL   Hematocrit 60.4 54.0 - 46.6 %   MCV 83 79 - 97 fL   MCH 27.6 26.6 - 33.0 pg   MCHC 33.2 31.5 - 35.7 g/dL   RDW 98.1 19.1 - 47.8 %   Platelets 233 150 - 450 x10E3/uL  CMP14+EGFR  Result Value Ref Range   Glucose 118 (H) 70 - 99 mg/dL   BUN 17 8 - 27 mg/dL   Creatinine, Ser 2.95 0.57 - 1.00 mg/dL   eGFR 99 >62 ZH/YQM/5.78   BUN/Creatinine Ratio 26 12 - 28   Sodium 140 134 - 144 mmol/L   Potassium 4.0 3.5 - 5.2 mmol/L   Chloride 102 96 - 106 mmol/L   CO2 24 20 - 29 mmol/L   Calcium 9.5 8.7 - 10.3 mg/dL   Total Protein 6.7  6.0 - 8.5 g/dL   Albumin 4.2 3.9 - 4.9 g/dL   Globulin, Total 2.5 1.5 - 4.5 g/dL   Albumin/Globulin Ratio 1.7 1.2 - 2.2   Bilirubin Total 0.2 0.0 - 1.2 mg/dL   Alkaline Phosphatase 78 44 - 121 IU/L   AST 24 0 - 40 IU/L   ALT 25 0 - 32 IU/L  Lyme Disease Serology w/Reflex  Result Value Ref Range   Lyme Total Antibody EIA Negative Negative       Pertinent labs & imaging results that were available during my care of the patient were reviewed by me and considered in my medical decision making.  Assessment & Plan:  Heather "[name]" was seen today for cyst.  Diagnoses and all orders for this visit:  Inflamed epidermoid cyst of skin Tolerated I&D well. Wound care discussed in detail. Bactrim as prescribed.  -     sulfamethoxazole-trimethoprim (BACTRIM DS) 800-160 MG tablet; Take 1 tablet by mouth 2 (two) times daily for 7 days. -     I & D -     I & D  History of vaginitis -     fluconazole (DIFLUCAN) 150 MG tablet; 1 po q week x 4 weeks     Continue all other maintenance medications.  Follow up plan: Return if symptoms worsen or fail to improve.   Continue healthy lifestyle choices, including diet (rich in fruits, vegetables, and lean proteins, and low in salt and simple carbohydrates) and exercise (at least 30 minutes of moderate physical activity daily).  Educational handout given for I&D  The above assessment and management plan was discussed with the patient. The patient verbalized understanding of and has agreed to the management plan. Patient is aware to call the clinic if they develop any new symptoms or  if symptoms persist or worsen. Patient is aware when to return to the clinic for a follow-up visit. Patient educated on when it is appropriate to go to the emergency department.   Kari Baars, FNP-C [facility] [phone]

## 2023-03-20 ENCOUNTER — Other Ambulatory Visit: Payer: Self-pay | Admitting: Family Medicine

## 2023-03-21 ENCOUNTER — Encounter: Payer: Self-pay | Admitting: Family Medicine

## 2023-03-21 ENCOUNTER — Other Ambulatory Visit: Payer: Self-pay

## 2023-03-21 ENCOUNTER — Other Ambulatory Visit (HOSPITAL_COMMUNITY): Payer: Self-pay

## 2023-03-21 ENCOUNTER — Other Ambulatory Visit: Payer: Self-pay | Admitting: Family Medicine

## 2023-03-21 DIAGNOSIS — T781XXA Other adverse food reactions, not elsewhere classified, initial encounter: Secondary | ICD-10-CM

## 2023-03-21 DIAGNOSIS — Z91018 Allergy to other foods: Secondary | ICD-10-CM

## 2023-03-21 MED ORDER — EPINEPHRINE 0.3 MG/0.3ML IJ SOAJ
0.3000 mg | INTRAMUSCULAR | 0 refills | Status: DC | PRN
Start: 2023-03-21 — End: 2023-04-05
  Filled 2023-03-21: qty 2, 1d supply, fill #0

## 2023-03-21 MED ORDER — TRAZODONE HCL 50 MG PO TABS
50.0000 mg | ORAL_TABLET | Freq: Every evening | ORAL | 3 refills | Status: DC | PRN
  Filled 2023-03-21: qty 90, 45d supply, fill #0

## 2023-03-23 ENCOUNTER — Other Ambulatory Visit: Payer: Commercial Managed Care - PPO

## 2023-03-23 ENCOUNTER — Encounter: Payer: Self-pay | Admitting: Family Medicine

## 2023-03-23 DIAGNOSIS — Z91018 Allergy to other foods: Secondary | ICD-10-CM

## 2023-03-25 ENCOUNTER — Other Ambulatory Visit (HOSPITAL_COMMUNITY): Payer: Self-pay

## 2023-03-27 LAB — IGE MILK W/ COMPONENT REFLEX: F002-IgE Milk: <0.1 kU/L

## 2023-03-30 ENCOUNTER — Other Ambulatory Visit (HOSPITAL_COMMUNITY): Payer: Self-pay

## 2023-04-05 ENCOUNTER — Other Ambulatory Visit: Payer: Self-pay

## 2023-04-05 ENCOUNTER — Other Ambulatory Visit: Payer: Self-pay | Admitting: Family Medicine

## 2023-04-05 ENCOUNTER — Ambulatory Visit: Payer: No Typology Code available for payment source | Admitting: Physician Assistant

## 2023-04-05 DIAGNOSIS — Z91018 Allergy to other foods: Secondary | ICD-10-CM

## 2023-04-05 MED ORDER — EPINEPHRINE 0.3 MG/0.3ML IJ SOAJ
0.3000 mg | INTRAMUSCULAR | 0 refills | Status: DC | PRN
  Filled 2023-04-05: qty 2, 30d supply, fill #0

## 2023-04-20 ENCOUNTER — Other Ambulatory Visit: Payer: Self-pay

## 2023-04-20 ENCOUNTER — Other Ambulatory Visit (HOSPITAL_COMMUNITY): Payer: Self-pay

## 2023-04-20 ENCOUNTER — Telehealth: Payer: Self-pay

## 2023-04-20 ENCOUNTER — Other Ambulatory Visit: Payer: Self-pay | Admitting: Family Medicine

## 2023-04-20 DIAGNOSIS — I2510 Atherosclerotic heart disease of native coronary artery without angina pectoris: Secondary | ICD-10-CM

## 2023-04-20 DIAGNOSIS — I252 Old myocardial infarction: Secondary | ICD-10-CM

## 2023-04-20 DIAGNOSIS — E782 Mixed hyperlipidemia: Secondary | ICD-10-CM

## 2023-04-20 MED ORDER — REPATHA SURECLICK 140 MG/ML ~~LOC~~ SOAJ
140.0000 mg | SUBCUTANEOUS | 5 refills | Status: DC
  Filled 2023-04-20: qty 2, 28d supply, fill #0
  Filled 2023-05-16: qty 2, 28d supply, fill #1
  Filled 2023-06-14: qty 2, 28d supply, fill #2
  Filled 2023-07-11: qty 2, 28d supply, fill #3

## 2023-04-20 NOTE — Telephone Encounter (Signed)
Patient has recently been diagnosed with Alpha Gal Syndrome and has found that her Cymbalta capsules contain bovine.  She needs to be prescribed a medication that is in tablet form only.  Also, she is requesting Buspar for anxiety.  Her pharmacy is [facility].

## 2023-04-21 ENCOUNTER — Other Ambulatory Visit: Payer: Self-pay

## 2023-04-21 ENCOUNTER — Other Ambulatory Visit (HOSPITAL_BASED_OUTPATIENT_CLINIC_OR_DEPARTMENT_OTHER): Payer: Self-pay

## 2023-04-21 ENCOUNTER — Other Ambulatory Visit: Payer: Self-pay | Admitting: Family Medicine

## 2023-04-21 DIAGNOSIS — I2510 Atherosclerotic heart disease of native coronary artery without angina pectoris: Secondary | ICD-10-CM

## 2023-04-21 DIAGNOSIS — I10 Essential (primary) hypertension: Secondary | ICD-10-CM

## 2023-04-21 DIAGNOSIS — F411 Generalized anxiety disorder: Secondary | ICD-10-CM

## 2023-04-21 MED ORDER — BUSPIRONE HCL 5 MG PO TABS: 5.0000 mg | ORAL_TABLET | Freq: Three times a day (TID) | ORAL | 1 refills | Status: DC

## 2023-04-21 MED ORDER — METOPROLOL TARTRATE 25 MG PO TABS
25.0000 mg | ORAL_TABLET | Freq: Two times a day (BID) | ORAL | 0 refills | Status: DC
Start: 2023-04-21 — End: 2023-07-12
  Filled 2023-04-21: qty 180, 90d supply, fill #0

## 2023-04-21 MED ORDER — LOSARTAN POTASSIUM-HCTZ 100-25 MG PO TABS
1.0000 | ORAL_TABLET | Freq: Every day | ORAL | 0 refills | Status: DC
  Filled 2023-04-21: qty 90, 90d supply, fill #0

## 2023-04-21 MED ORDER — ESCITALOPRAM OXALATE 5 MG PO TABS: 5.0000 mg | ORAL_TABLET | Freq: Every day | ORAL | 3 refills | Status: DC

## 2023-04-21 NOTE — Telephone Encounter (Signed)
I sent this to [facility] pharmacy so she could start today. Follow up in 6 weeks to see if beneficial. Buspar and Lexapro were sent in.

## 2023-04-21 NOTE — Progress Notes (Signed)
Will trial lexapro and buspar as needed for anxiety. Pt aware to follow up in 6 weeks after starting Lexapro to see if beneficial.

## 2023-04-21 NOTE — Telephone Encounter (Signed)
Patient aware.

## 2023-05-06 ENCOUNTER — Ambulatory Visit: Payer: Commercial Managed Care - PPO | Admitting: Family Medicine

## 2023-05-12 ENCOUNTER — Ambulatory Visit: Payer: Commercial Managed Care - PPO | Admitting: Family Medicine

## 2023-05-13 ENCOUNTER — Ambulatory Visit: Payer: 59 | Admitting: Physician Assistant

## 2023-05-16 ENCOUNTER — Other Ambulatory Visit: Payer: Self-pay

## 2023-05-16 ENCOUNTER — Encounter: Payer: Self-pay | Admitting: Family Medicine

## 2023-05-16 ENCOUNTER — Other Ambulatory Visit (HOSPITAL_COMMUNITY): Payer: Self-pay

## 2023-05-16 DIAGNOSIS — F411 Generalized anxiety disorder: Secondary | ICD-10-CM

## 2023-05-16 MED ORDER — ESCITALOPRAM OXALATE 5 MG PO TABS
5.0000 mg | ORAL_TABLET | Freq: Every day | ORAL | 3 refills | Status: DC
Start: 2023-05-16 — End: 2023-07-12
  Filled 2023-05-16: qty 90, 90d supply, fill #0

## 2023-05-17 ENCOUNTER — Other Ambulatory Visit: Payer: Self-pay | Admitting: Family Medicine

## 2023-05-17 ENCOUNTER — Other Ambulatory Visit (HOSPITAL_COMMUNITY): Payer: Self-pay

## 2023-05-17 ENCOUNTER — Ambulatory Visit: Payer: Commercial Managed Care - PPO

## 2023-05-17 ENCOUNTER — Encounter: Payer: Self-pay | Admitting: Family Medicine

## 2023-05-17 DIAGNOSIS — M130 Polyarthritis, unspecified: Secondary | ICD-10-CM

## 2023-05-23 ENCOUNTER — Other Ambulatory Visit: Payer: Self-pay | Admitting: Family

## 2023-05-23 MED ORDER — CEPHALEXIN 500 MG PO CAPS
500.0000 mg | ORAL_CAPSULE | Freq: Two times a day (BID) | ORAL | 0 refills | Status: DC
Start: 2023-05-23 — End: 2023-07-12

## 2023-05-23 NOTE — Progress Notes (Signed)
Pt calls with UTI symptoms that started several days ago and has worsen. Keflex Prescription sent to pharmacy

## 2023-06-14 ENCOUNTER — Other Ambulatory Visit (HOSPITAL_COMMUNITY): Payer: Self-pay

## 2023-06-24 ENCOUNTER — Encounter: Payer: Self-pay | Admitting: Family Medicine

## 2023-06-24 NOTE — Telephone Encounter (Signed)
Can you reroute referral?

## 2023-07-11 ENCOUNTER — Other Ambulatory Visit (HOSPITAL_COMMUNITY): Payer: Self-pay

## 2023-07-12 ENCOUNTER — Encounter: Payer: Self-pay | Admitting: Family Medicine

## 2023-07-12 ENCOUNTER — Other Ambulatory Visit: Payer: Self-pay

## 2023-07-12 ENCOUNTER — Ambulatory Visit (INDEPENDENT_AMBULATORY_CARE_PROVIDER_SITE_OTHER): Payer: Commercial Managed Care - PPO | Admitting: Family Medicine

## 2023-07-12 ENCOUNTER — Other Ambulatory Visit (HOSPITAL_COMMUNITY): Payer: Self-pay

## 2023-07-12 VITALS — BP 142/88 | HR 54 | Temp 98.1°F | Ht 63.0 in | Wt 207.0 lb

## 2023-07-12 DIAGNOSIS — F411 Generalized anxiety disorder: Secondary | ICD-10-CM | POA: Diagnosis not present

## 2023-07-12 DIAGNOSIS — M130 Polyarthritis, unspecified: Secondary | ICD-10-CM

## 2023-07-12 DIAGNOSIS — I10 Essential (primary) hypertension: Secondary | ICD-10-CM | POA: Diagnosis not present

## 2023-07-12 DIAGNOSIS — E782 Mixed hyperlipidemia: Secondary | ICD-10-CM

## 2023-07-12 DIAGNOSIS — M1611 Unilateral primary osteoarthritis, right hip: Secondary | ICD-10-CM

## 2023-07-12 DIAGNOSIS — Z9861 Coronary angioplasty status: Secondary | ICD-10-CM

## 2023-07-12 DIAGNOSIS — I252 Old myocardial infarction: Secondary | ICD-10-CM

## 2023-07-12 DIAGNOSIS — I2510 Atherosclerotic heart disease of native coronary artery without angina pectoris: Secondary | ICD-10-CM

## 2023-07-12 MED ORDER — REPATHA SURECLICK 140 MG/ML ~~LOC~~ SOAJ
140.0000 mg | SUBCUTANEOUS | 5 refills | Status: DC
  Filled 2023-07-12 – 2023-08-21 (×2): qty 2, 28d supply, fill #0
  Filled 2023-10-03 (×2): qty 2, 28d supply, fill #1
  Filled 2023-10-27: qty 2, 28d supply, fill #2
  Filled 2023-11-22: qty 2, 28d supply, fill #3
  Filled 2023-12-21: qty 2, 28d supply, fill #4
  Filled 2024-01-18: qty 2, 28d supply, fill #5

## 2023-07-12 MED ORDER — METHYLPREDNISOLONE ACETATE 40 MG/ML IJ SUSP
40.0000 mg | Freq: Once | INTRAMUSCULAR | Status: DC
Start: 2023-07-22 — End: 2024-01-03

## 2023-07-12 MED ORDER — TRAZODONE HCL 50 MG PO TABS
50.0000 mg | ORAL_TABLET | Freq: Every evening | ORAL | 3 refills | Status: DC | PRN
  Filled 2023-07-12: qty 90, 45d supply, fill #0
  Filled 2023-10-27: qty 90, 45d supply, fill #1

## 2023-07-12 MED ORDER — METOPROLOL TARTRATE 25 MG PO TABS
25.0000 mg | ORAL_TABLET | Freq: Two times a day (BID) | ORAL | 3 refills | Status: DC
  Filled 2023-07-12: qty 180, 90d supply, fill #0
  Filled 2023-10-11: qty 180, 90d supply, fill #1
  Filled 2024-01-11: qty 180, 90d supply, fill #2

## 2023-07-12 MED ORDER — LOSARTAN POTASSIUM-HCTZ 100-25 MG PO TABS
1.0000 | ORAL_TABLET | Freq: Every day | ORAL | 3 refills | Status: DC
  Filled 2023-07-12: qty 90, 90d supply, fill #0
  Filled 2023-10-11: qty 90, 90d supply, fill #1
  Filled 2024-01-11: qty 90, 90d supply, fill #2

## 2023-07-12 MED ORDER — ESCITALOPRAM OXALATE 5 MG PO TABS
5.0000 mg | ORAL_TABLET | Freq: Every day | ORAL | 3 refills | Status: DC
  Filled 2023-07-12 – 2023-08-11 (×2): qty 90, 90d supply, fill #0
  Filled 2023-11-16: qty 90, 90d supply, fill #1
  Filled 2024-02-16: qty 90, 90d supply, fill #2

## 2023-07-12 MED ORDER — BUSPIRONE HCL 5 MG PO TABS
5.0000 mg | ORAL_TABLET | Freq: Three times a day (TID) | ORAL | 3 refills | Status: DC
  Filled 2023-07-12: qty 270, 90d supply, fill #0

## 2023-07-12 MED ORDER — PREDNISONE 10 MG (21) PO TBPK
ORAL_TABLET | ORAL | 0 refills | Status: DC
Start: 2023-07-12 — End: 2024-01-03

## 2023-07-12 MED ORDER — HYDROCODONE-ACETAMINOPHEN 5-325 MG PO TABS: 1.0000 | ORAL_TABLET | Freq: Four times a day (QID) | ORAL | 0 refills | Status: DC | PRN

## 2023-07-12 NOTE — Progress Notes (Signed)
Subjective: CC: Right hip pain PCP: Raliegh Ip, DO Heather Lam is a 62 y.o. female presenting to clinic today for:  1.  Right hip pain This has been a chronic issue for patient.  She has known osteoarthritis of the right hip.  She does have a hip surgeon and has been told that she will need a hip replacement at some point.  However this has been put on the back burner for now because she has been extremely busy with work and caring for her mom.  She has not had any falls but does have quite a bit of difficulty with prolonged standing or ambulation.  Symptoms are refractory to Tylenol.  Given history of MI cannot take high potency NSAIDs.  She continues to have polyarthritis in the hands and feet where she has soft tissue swelling and tenderness to touch.  She will be seeing rheumatology after the new year, Dr. Dierdre Forth, for further evaluation.  She has not yet scheduled a follow-up visit with her hip surgeon but plans to do so ASAP.  2.  Hypertension with hyperlipidemia with history of CAD Patient is compliant with all medications.  Not sure if she needs refills.  Reports no chest pain, shortness of breath or change in exercise tolerance.  She did not qualify for Wegovy due to insurance restrictions despite history of MI.  She subsequently has not had very much success with weight loss on her own   ROS: Per HPI  Allergies  Allergen Reactions   Alpha-Gal    Ciprofloxacin Itching   Past Medical History:  Diagnosis Date   Arthritis    Back pain    CAD (coronary artery disease)    Chest pain    Complication of anesthesia    Constipation    Coronary arteriosclerosis 05/04/2016   Diverticulitis    Diverticulosis    Family history of adverse reaction to anesthesia    Mom PONV   Heart attack (HCC) 2010   History of carpal tunnel syndrome    Bilateral   History of kidney stones    Hyperlipidemia    Hypertension    Intra-abdominal abscess (HCC)    Joint pain     Morbid obesity (HCC)    Myocardial infarction (HCC) 05/04/2009   PONV (postoperative nausea and vomiting)    Pre-diabetes    Skin cancer    on nose   Sleep apnea    not using cpap at this time   Vitamin D deficiency     Current Outpatient Medications:    predniSONE (STERAPRED UNI-PAK 21 TAB) 10 MG (21) TBPK tablet, As directed x 6 days, Disp: 21 tablet, Rfl: 0   Aspirin 81 MG CAPS, Take 1 capsule by mouth daily at 6 (six) AM., Disp: , Rfl:    busPIRone (BUSPAR) 5 MG tablet, Take 1 tablet (5 mg total) by mouth 3 (three) times daily., Disp: 270 tablet, Rfl: 3   Calcium 500 MG tablet, Take 1 tablet by mouth daily at 6 (six) AM., Disp: , Rfl:    Cholecalciferol (VITAMIN D) 125 MCG (5000 UT) CAPS, Take 1 capsule by mouth daily at 6 (six) AM., Disp: , Rfl:    EPINEPHrine (EPIPEN 2-PAK) 0.3 mg/0.3 mL IJ SOAJ injection, Inject 0.3 mg into the muscle as needed for anaphylaxis. Then call 911, Disp: 2 each, Rfl: 0   escitalopram (LEXAPRO) 5 MG tablet, Take 1 tablet (5 mg total) by mouth daily., Disp: 90 tablet, Rfl: 3   Evolocumab (  REPATHA SURECLICK) 140 MG/ML SOAJ, Inject 140 mg into the skin every 14 days., Disp: 2 mL, Rfl: 5   HYDROcodone-acetaminophen (NORCO) 5-325 MG tablet, Take 1 tablet by mouth every 6 (six) hours as needed for moderate pain (pain score 4-6)., Disp: 10 tablet, Rfl: 0   losartan-hydrochlorothiazide (HYZAAR) 100-25 MG tablet, Take 1 tablet by mouth daily., Disp: 90 tablet, Rfl: 3   metoprolol tartrate (LOPRESSOR) 25 MG tablet, Take 1 tablet (25 mg total) by mouth 2 (two) times daily., Disp: 180 tablet, Rfl: 3   nitroGLYCERIN (NITROSTAT) 0.4 MG SL tablet, DISSOLVE 1 TABLET UNDER TONGUE EVERY 5 MINUTES UP TO 3 TIMES FOR CHEST PAIN THEN CALL DR IF NO RELIEF, Disp: 25 tablet, Rfl: 3   traZODone (DESYREL) 50 MG tablet, Take 1-2 tablets (50-100 mg total) by mouth at bedtime as needed., Disp: 90 tablet, Rfl: 3  Current Facility-Administered Medications:    [START ON 07/22/2023]  methylPREDNISolone acetate (DEPO-MEDROL) injection 40 mg, 40 mg, Intramuscular, Once,  Social History   Socioeconomic History   Marital status: Divorced    Spouse name: Not on file   Number of children: Not on file   Years of education: 14   Highest education level: Associate degree: academic program  Occupational History   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[place] [employer]    Comment: Adult nurse at [employer]  Tobacco Use   Smoking status: Former    Current packs/day: 0.00    Average packs/day: 1 pack/day for 20.0 years (20.0 ttl pk-yrs)    Types: Cigarettes    Start date: 73    Quit date: 2010    Years since quitting: 14.9   Smokeless tobacco: Never  Vaping Use   Vaping status: Never Used  Substance and Sexual Activity   Alcohol use: Not Currently    Comment: Rare    Drug use: No   Sexual activity: Yes    Birth control/protection: Post-menopausal  Other Topics Concern   Not on file  Social History Narrative   Caffeine daily    Right handed   One story home   Social Determinants of Health   Financial Resource Strain: Low Risk  (07/08/2023)   Overall Financial Resource Strain (CARDIA)    Difficulty of Paying Living Expenses: Not hard at all  Food Insecurity: No Food Insecurity (07/08/2023)   Hunger Vital Sign    Worried About Running Out of Food in the Last Year: Never true    Ran Out of Food in the Last Year: Never true  Transportation Needs: No Transportation Needs (07/08/2023)   PRAPARE - Administrator, Civil Service (Medical): No    Lack of Transportation (Non-Medical): No  Physical Activity: Insufficiently Active (07/08/2023)   Exercise Vital Sign    Days of Exercise per Week: 4 days    Minutes of Exercise per Session: 30 min  Stress: No Stress Concern Present (07/08/2023)   [employer] of Occupational Health - Occupational Stress Questionnaire    Feeling of Stress : Not at all  Social Connections: Moderately Integrated  (07/08/2023)   Social Connection and Isolation Panel [NHANES]    Frequency of Communication with Friends and Family: More than three times a week    Frequency of Social Gatherings with Friends and Family: More than three times a week    Attends Religious Services: More than 4 times per year    Active Member of [employer] or Organizations: No    Attends Banker Meetings:  1 to 4 times per year    Marital Status: Divorced  Catering manager Violence: Not on file   Family History  Problem Relation Age of Onset   Hyperlipidemia Mother    Hypertension Mother    Cancer Father        skin   Hyperlipidemia Father    Dementia Father    Arthritis Father    Hypertension Father    Sleep apnea Father    Other Sister        Myelofibrosis   Arthritis Sister    Hyperlipidemia Sister    Arthritis Sister    Hyperlipidemia Sister    Diabetes Paternal Grandfather    Colon cancer Neg Hx     Objective: Office vital signs reviewed. BP (!) 142/88   Pulse (!) 54   Temp 98.1 F (36.7 C)   Ht 5\' 3"  (1.6 m)   Wt 207 lb (93.9 kg)   LMP 12/31/2013   SpO2 96%   BMI 36.67 kg/m   Physical Examination:  General: Awake, alert, well nourished, No acute distress HEENT: sclera white, MMM MSK: Ambulating independently with minimally antalgic gait and station.  She has visible swelling of the hands bilaterally without erythema or appreciated warmth.  No gross joint deformity appreciated but she has limited flexion of the hand particularly on the right compared to the left   Assessment/ Plan: 62 y.o. female   Primary osteoarthritis of right hip - Plan: methylPREDNISolone acetate (DEPO-MEDROL) injection 40 mg, predniSONE (STERAPRED UNI-PAK 21 TAB) 10 MG (21) TBPK tablet, ToxASSURE Select 13 (MW), Urine  Polyarthritis - Plan: methylPREDNISolone acetate (DEPO-MEDROL) injection 40 mg, predniSONE (STERAPRED UNI-PAK 21 TAB) 10 MG (21) TBPK tablet, HYDROcodone-acetaminophen (NORCO) 5-325 MG tablet,  ToxASSURE Select 13 (MW), Urine  Generalized anxiety disorder - Plan: busPIRone (BUSPAR) 5 MG tablet, escitalopram (LEXAPRO) 5 MG tablet  CAD S/P percutaneous coronary angioplasty - Plan: Evolocumab (REPATHA SURECLICK) 140 MG/ML SOAJ, metoprolol tartrate (LOPRESSOR) 25 MG tablet  Mixed hyperlipidemia - Plan: Evolocumab (REPATHA SURECLICK) 140 MG/ML SOAJ  History of MI (myocardial infarction) - Plan: Evolocumab (REPATHA SURECLICK) 140 MG/ML SOAJ  Essential hypertension - Plan: losartan-hydrochlorothiazide (HYZAAR) 100-25 MG tablet, metoprolol tartrate (LOPRESSOR) 25 MG tablet  Plan for steroid injection next week as she will be going out of town the following few days.  I have given her a pack of steroid to have on hand if needed for severe pain and have renewed her Norco.  UDS and CSA were updated as per office policy.  National narcotic database reviewed and there were no red flags  Did not discuss anxiety disorder but BuSpar and Lexapro needed refills of these were sent  I renewed all of her blood pressure and cholesterol medications.  She will schedule follow-up with cardiology after the new year.   Raliegh Ip, DO [facility] [phone]

## 2023-07-15 LAB — TOXASSURE SELECT 13 (MW), URINE

## 2023-08-02 ENCOUNTER — Other Ambulatory Visit: Payer: Self-pay

## 2023-08-02 ENCOUNTER — Telehealth: Payer: Self-pay | Admitting: Family Medicine

## 2023-08-02 MED ORDER — CEPHALEXIN 500 MG PO CAPS
500.0000 mg | ORAL_CAPSULE | Freq: Two times a day (BID) | ORAL | 0 refills | Status: DC
  Filled 2023-08-02: qty 14, 7d supply, fill #0

## 2023-08-02 MED ORDER — CEPHALEXIN 500 MG PO CAPS: 500.0000 mg | ORAL_CAPSULE | Freq: Two times a day (BID) | ORAL | 0 refills | Status: DC

## 2023-08-02 NOTE — Telephone Encounter (Signed)
Left detailed message per signed DPR. Encouraged call back if there are any questions.

## 2023-08-02 NOTE — Telephone Encounter (Signed)
Keflex Prescription sent to pharmacy   

## 2023-08-04 ENCOUNTER — Other Ambulatory Visit: Payer: Self-pay

## 2023-08-04 ENCOUNTER — Other Ambulatory Visit (HOSPITAL_COMMUNITY): Payer: Self-pay

## 2023-08-04 ENCOUNTER — Other Ambulatory Visit: Payer: Self-pay | Admitting: Family Medicine

## 2023-08-04 MED ORDER — NITROGLYCERIN 0.4 MG SL SUBL
SUBLINGUAL_TABLET | SUBLINGUAL | 3 refills | Status: DC
  Filled 2023-08-04: qty 25, 8d supply, fill #0

## 2023-08-04 MED ORDER — CEPHALEXIN 250 MG PO CAPS
250.0000 mg | ORAL_CAPSULE | ORAL | 1 refills | Status: DC | PRN
  Filled 2023-08-04: qty 30, 30d supply, fill #0

## 2023-08-05 ENCOUNTER — Other Ambulatory Visit: Payer: Self-pay

## 2023-08-10 DIAGNOSIS — E669 Obesity, unspecified: Secondary | ICD-10-CM | POA: Diagnosis not present

## 2023-08-10 DIAGNOSIS — M2559 Pain in other specified joint: Secondary | ICD-10-CM | POA: Diagnosis not present

## 2023-08-10 DIAGNOSIS — M1991 Primary osteoarthritis, unspecified site: Secondary | ICD-10-CM | POA: Diagnosis not present

## 2023-08-10 DIAGNOSIS — M7989 Other specified soft tissue disorders: Secondary | ICD-10-CM | POA: Diagnosis not present

## 2023-08-10 DIAGNOSIS — Z6836 Body mass index (BMI) 36.0-36.9, adult: Secondary | ICD-10-CM | POA: Diagnosis not present

## 2023-08-12 ENCOUNTER — Other Ambulatory Visit: Payer: Self-pay

## 2023-08-12 ENCOUNTER — Other Ambulatory Visit (HOSPITAL_COMMUNITY): Payer: Self-pay

## 2023-08-18 ENCOUNTER — Encounter: Payer: Self-pay | Admitting: Family

## 2023-08-18 ENCOUNTER — Telehealth: Payer: Self-pay | Admitting: Family Medicine

## 2023-08-18 ENCOUNTER — Telehealth: Payer: 59 | Admitting: Family

## 2023-08-18 ENCOUNTER — Other Ambulatory Visit: Payer: Self-pay | Admitting: Family

## 2023-08-18 DIAGNOSIS — N390 Urinary tract infection, site not specified: Secondary | ICD-10-CM | POA: Diagnosis not present

## 2023-08-18 LAB — URINALYSIS, COMPLETE
Bilirubin, UA: NEGATIVE
Glucose, UA: NEGATIVE
Ketones, UA: NEGATIVE
Nitrite, UA: NEGATIVE
Specific Gravity, UA: 1.025 (ref 1.005–1.030)
Urobilinogen, Ur: 1 mg/dL (ref 0.2–1.0)
pH, UA: 7 (ref 5.0–7.5)

## 2023-08-18 MED ORDER — SULFAMETHOXAZOLE-TRIMETHOPRIM 800-160 MG PO TABS: 1.0000 | ORAL_TABLET | Freq: Two times a day (BID) | ORAL | 0 refills | Status: DC

## 2023-08-18 NOTE — Progress Notes (Signed)
Virtual Visit Consent   Heather Lam, you are scheduled for a virtual visit with a [facility] provider today. Just as with appointments in the office, your consent must be obtained to participate. Your consent will be active for this visit and any virtual visit you may have with one of our providers in the next 365 days. If you have a MyChart account, a copy of this consent can be sent to you electronically.  As this is a virtual visit, video technology does not allow for your provider to perform a traditional examination. This may limit your provider's ability to fully assess your condition. If your provider identifies any concerns that need to be evaluated in person or the need to arrange testing (such as labs, EKG, etc.), we will make arrangements to do so. Although advances in technology are sophisticated, we cannot ensure that it will always work on either your end or our end. If the connection with a video visit is poor, the visit may have to be switched to a telephone visit. With either a video or telephone visit, we are not always able to ensure that we have a secure connection.  By engaging in this virtual visit, you consent to the provision of healthcare and authorize for your insurance to be billed (if applicable) for the services provided during this visit. Depending on your insurance coverage, you may receive a charge related to this service.  I need to obtain your verbal consent now. Are you willing to proceed with your visit today? Heather Lam has provided verbal consent on 08/18/2023 for a virtual visit (video or telephone). [name], FNP  Date: 08/18/2023 9:58 AM  Virtual Visit via Video Note   I, [name], connected with  Heather Lam  ([id number], Dec 23, 1960) on 08/18/23 at  9:40 AM EST by a video-enabled telemedicine application and verified that I am speaking with the correct person using two identifiers.  Location: Patient: Virtual Visit Location Patient:  Home Provider: Virtual Visit Location Provider: Home Office   I discussed the limitations of evaluation and management by telemedicine and the availability of in person appointments. The patient expressed understanding and agreed to proceed.    History of Present Illness: Heather Lam is a 63 y.o. who identifies as a female who was assigned female at birth, and is being seen today for recurrent UTI symptoms. She had UTI symptoms 08/02/23 and given Keflex 500 mg BID for 7 days. She reports she was feeling better, but her symptoms have returned.   HPI: Dysuria  This is a recurrent problem. The current episode started 1 to 4 weeks ago. The problem occurs intermittently. Associated symptoms include frequency and urgency. Pertinent negatives include no flank pain, hematuria, hesitancy, nausea or vomiting. She has tried antibiotics and increased fluids for the symptoms. The treatment provided mild relief.  Vaginal Pain The patient's primary symptoms include genital itching. This is a new problem. Associated symptoms include dysuria, frequency and urgency. Pertinent negatives include no flank pain, hematuria, nausea or vomiting.    Problems:  Patient Active Problem List   Diagnosis Date Noted   OA (osteoarthritis) of hip 10/26/2021   Osteoarthritis of left hip 10/26/2021   S/P hernia surgery 10/10/2020   Morbid obesity (HCC)    History of colostomy reversal 07/30/2019   Cyst of ovary 02/20/2019   Kidney stone 02/20/2019   Migraine 02/20/2019   Diverticulitis 01/27/2019   Actinic keratoses 05/04/2017   History of MI (myocardial infarction) 04/01/2017  Prediabetes 09/28/2016   Vitamin D deficiency 08/16/2016   Recurrent major depressive disorder, in partial remission (HCC) 05/25/2016   Diverticular disease 05/04/2016   Malignant neoplasm of skin 05/04/2016   Carpal tunnel syndrome 02/26/2016   Obesity (BMI 30-39.9) 02/25/2016   Insomnia 07/01/2015   Essential hypertension 05/21/2015    Generalized anxiety disorder 06/06/2013   CAD S/P percutaneous coronary angioplasty 09/13/2011   Antiplatelet or antithrombotic long-term use 09/13/2011   OSA on CPAP 09/13/2011   Hyperlipidemia 09/13/2011    Allergies:  Allergies  Allergen Reactions   Alpha-Gal    Ciprofloxacin Itching   Medications:  Current Outpatient Medications:    Aspirin 81 MG CAPS, Take 1 capsule by mouth daily at 6 (six) AM., Disp: , Rfl:    busPIRone (BUSPAR) 5 MG tablet, Take 1 tablet (5 mg total) by mouth 3 (three) times daily., Disp: 270 tablet, Rfl: 3   Calcium 500 MG tablet, Take 1 tablet by mouth daily at 6 (six) AM., Disp: , Rfl:    cephALEXin (KEFLEX) 250 MG capsule, Take 1 capsule (250 mg total) by mouth as needed., Disp: 30 capsule, Rfl: 1   Cholecalciferol (VITAMIN D) 125 MCG (5000 UT) CAPS, Take 1 capsule by mouth daily at 6 (six) AM., Disp: , Rfl:    EPINEPHrine (EPIPEN 2-PAK) 0.3 mg/0.3 mL IJ SOAJ injection, Inject 0.3 mg into the muscle as needed for anaphylaxis. Then call 911, Disp: 2 each, Rfl: 0   escitalopram (LEXAPRO) 5 MG tablet, Take 1 tablet (5 mg total) by mouth daily., Disp: 90 tablet, Rfl: 3   Evolocumab (REPATHA SURECLICK) 140 MG/ML SOAJ, Inject 140 mg into the skin every 14 days., Disp: 2 mL, Rfl: 5   HYDROcodone-acetaminophen (NORCO) 5-325 MG tablet, Take 1 tablet by mouth every 6 (six) hours as needed for moderate pain (pain score 4-6)., Disp: 10 tablet, Rfl: 0   losartan-hydrochlorothiazide (HYZAAR) 100-25 MG tablet, Take 1 tablet by mouth daily., Disp: 90 tablet, Rfl: 3   metoprolol tartrate (LOPRESSOR) 25 MG tablet, Take 1 tablet (25 mg total) by mouth 2 (two) times daily., Disp: 180 tablet, Rfl: 3   nitroGLYCERIN (NITROSTAT) 0.4 MG SL tablet, DISSOLVE 1 TABLET UNDER TONGUE EVERY 5 MINUTES UP TO 3 TIMES FOR CHEST PAIN THEN CALL DR IF NO RELIEF, Disp: 25 tablet, Rfl: 3   predniSONE (STERAPRED UNI-PAK 21 TAB) 10 MG (21) TBPK tablet, As directed x 6 days, Disp: 21 tablet, Rfl: 0    sulfamethoxazole-trimethoprim (BACTRIM DS) 800-160 MG tablet, Take 1 tablet by mouth 2 (two) times daily., Disp: 14 tablet, Rfl: 0   traZODone (DESYREL) 50 MG tablet, Take 1-2 tablets (50-100 mg total) by mouth at bedtime as needed., Disp: 90 tablet, Rfl: 3  Current Facility-Administered Medications:    methylPREDNISolone acetate (DEPO-MEDROL) injection 40 mg, 40 mg, Intramuscular, Once,   Observations/Objective: Patient is well-developed, well-nourished in no acute distress.  Resting comfortably  at home.  Head is normocephalic, atraumatic.  No labored breathing.  Speech is clear and coherent with logical content.  Patient is alert and oriented at baseline.    Assessment and Plan: 1. Recurrent UTI (Primary) - Urinalysis, Complete; Future - Urine Culture; Future - sulfamethoxazole-trimethoprim (BACTRIM DS) 800-160 MG tablet; Take 1 tablet by mouth 2 (two) times daily.  Dispense: 14 tablet; Refill: 0  Force fluids AZO over the counter X2 days Culture pending Follow up if symptoms   Follow Up Instructions: I discussed the assessment and treatment plan with the patient. The patient was  provided an opportunity to ask questions and all were answered. The patient agreed with the plan and demonstrated an understanding of the instructions.  A copy of instructions were sent to the patient via MyChart unless otherwise noted below.    The patient was advised to call back or seek an in-person evaluation if the symptoms worsen or if the condition fails to improve as anticipated.    [name], FNP

## 2023-08-18 NOTE — Telephone Encounter (Signed)
Pt had an appointment today

## 2023-08-22 ENCOUNTER — Other Ambulatory Visit: Payer: Self-pay

## 2023-08-22 LAB — URINE CULTURE

## 2023-09-01 ENCOUNTER — Telehealth: Payer: 59 | Admitting: Family Medicine

## 2023-09-01 ENCOUNTER — Encounter: Payer: Self-pay | Admitting: Family Medicine

## 2023-09-01 DIAGNOSIS — R509 Fever, unspecified: Secondary | ICD-10-CM | POA: Diagnosis not present

## 2023-09-01 DIAGNOSIS — Z8619 Personal history of other infectious and parasitic diseases: Secondary | ICD-10-CM | POA: Diagnosis not present

## 2023-09-01 DIAGNOSIS — J069 Acute upper respiratory infection, unspecified: Secondary | ICD-10-CM

## 2023-09-01 MED ORDER — AMOXICILLIN-POT CLAVULANATE 875-125 MG PO TABS: 1.0000 | ORAL_TABLET | Freq: Two times a day (BID) | ORAL | 0 refills | Status: AC

## 2023-09-01 MED ORDER — FLUCONAZOLE 150 MG PO TABS: 150.0000 mg | ORAL_TABLET | Freq: Once | ORAL | 0 refills | Status: AC

## 2023-09-01 NOTE — Progress Notes (Signed)
Virtual Visit via Video   I connected with patient on 09/01/23 at 1230 by a video enabled telemedicine application and verified that I am speaking with the correct person using two identifiers.  Location patient: Home Location provider: [facility] Office Persons participating in the virtual visit: Patient and Provider  I discussed the limitations of evaluation and management by telemedicine and the availability of in person appointments. The patient expressed understanding and agreed to proceed.  Subjective:   HPI:  Pt presents today for  Chief Complaint  Patient presents with   Fever   Fever  This is a new problem. The current episode started in the past 7 days. The problem has been waxing and waning. The maximum temperature noted was 101 to 101.9 F. The temperature was taken using an oral thermometer. Associated symptoms include congestion, coughing, ear pain, headaches and muscle aches. Pertinent negatives include no abdominal pain, chest pain, diarrhea, nausea, rash, sleepiness, sore throat, urinary pain, vomiting or wheezing. She has tried acetaminophen for the symptoms. The treatment provided mild relief.     Review of Systems  Constitutional:  Positive for fever.  HENT:  Positive for congestion and ear pain. Negative for sore throat.   Respiratory:  Positive for cough. Negative for wheezing.   Cardiovascular:  Negative for chest pain.  Gastrointestinal:  Negative for abdominal pain, diarrhea, nausea and vomiting.  Genitourinary:  Negative for dysuria.  Skin:  Negative for rash.  Neurological:  Positive for headaches.     Patient Active Problem List   Diagnosis Date Noted   OA (osteoarthritis) of hip 10/26/2021   Osteoarthritis of left hip 10/26/2021   S/P hernia surgery 10/10/2020   Morbid obesity (HCC)    History of colostomy reversal 07/30/2019   Cyst of ovary 02/20/2019   Kidney stone 02/20/2019   Migraine 02/20/2019   Diverticulitis  01/27/2019   Actinic keratoses 05/04/2017   History of MI (myocardial infarction) 04/01/2017   Prediabetes 09/28/2016   Vitamin D deficiency 08/16/2016   Recurrent major depressive disorder, in partial remission (HCC) 05/25/2016   Diverticular disease 05/04/2016   Malignant neoplasm of skin 05/04/2016   Carpal tunnel syndrome 02/26/2016   Obesity (BMI 30-39.9) 02/25/2016   Insomnia 07/01/2015   Essential hypertension 05/21/2015   Generalized anxiety disorder 06/06/2013   CAD S/P percutaneous coronary angioplasty 09/13/2011   Antiplatelet or antithrombotic long-term use 09/13/2011   OSA on CPAP 09/13/2011   Hyperlipidemia 09/13/2011    Social History   Tobacco Use   Smoking status: Former    Current packs/day: 0.00    Average packs/day: 1 pack/day for 20.0 years (20.0 ttl pk-yrs)    Types: Cigarettes    Start date: 82    Quit date: 2010    Years since quitting: 15.0   Smokeless tobacco: Never  Substance Use Topics   Alcohol use: Not Currently    Comment: Rare     Current Outpatient Medications:    amoxicillin-clavulanate (AUGMENTIN) 875-125 MG tablet, Take 1 tablet by mouth 2 (two) times daily for 7 days., Disp: 14 tablet, Rfl: 0   fluconazole (DIFLUCAN) 150 MG tablet, Take 1 tablet (150 mg total) by mouth once for 1 dose., Disp: 1 tablet, Rfl: 0   Aspirin 81 MG CAPS, Take 1 capsule by mouth daily at 6 (six) AM., Disp: , Rfl:    busPIRone (BUSPAR) 5 MG tablet, Take 1 tablet (5 mg total) by mouth 3 (three) times daily., Disp: 270 tablet, Rfl: 3  Calcium 500 MG tablet, Take 1 tablet by mouth daily at 6 (six) AM., Disp: , Rfl:    cephALEXin (KEFLEX) 250 MG capsule, Take 1 capsule (250 mg total) by mouth as needed., Disp: 30 capsule, Rfl: 1   Cholecalciferol (VITAMIN D) 125 MCG (5000 UT) CAPS, Take 1 capsule by mouth daily at 6 (six) AM., Disp: , Rfl:    EPINEPHrine (EPIPEN 2-PAK) 0.3 mg/0.3 mL IJ SOAJ injection, Inject 0.3 mg into the muscle as needed for anaphylaxis. Then  call 911, Disp: 2 each, Rfl: 0   escitalopram (LEXAPRO) 5 MG tablet, Take 1 tablet (5 mg total) by mouth daily., Disp: 90 tablet, Rfl: 3   Evolocumab (REPATHA SURECLICK) 140 MG/ML SOAJ, Inject 140 mg into the skin every 14 days., Disp: 2 mL, Rfl: 5   HYDROcodone-acetaminophen (NORCO) 5-325 MG tablet, Take 1 tablet by mouth every 6 (six) hours as needed for moderate pain (pain score 4-6)., Disp: 10 tablet, Rfl: 0   losartan-hydrochlorothiazide (HYZAAR) 100-25 MG tablet, Take 1 tablet by mouth daily., Disp: 90 tablet, Rfl: 3   metoprolol tartrate (LOPRESSOR) 25 MG tablet, Take 1 tablet (25 mg total) by mouth 2 (two) times daily., Disp: 180 tablet, Rfl: 3   nitroGLYCERIN (NITROSTAT) 0.4 MG SL tablet, DISSOLVE 1 TABLET UNDER TONGUE EVERY 5 MINUTES UP TO 3 TIMES FOR CHEST PAIN THEN CALL DR IF NO RELIEF, Disp: 25 tablet, Rfl: 3   predniSONE (STERAPRED UNI-PAK 21 TAB) 10 MG (21) TBPK tablet, As directed x 6 days, Disp: 21 tablet, Rfl: 0   sulfamethoxazole-trimethoprim (BACTRIM DS) 800-160 MG tablet, Take 1 tablet by mouth 2 (two) times daily., Disp: 14 tablet, Rfl: 0   traZODone (DESYREL) 50 MG tablet, Take 1-2 tablets (50-100 mg total) by mouth at bedtime as needed., Disp: 90 tablet, Rfl: 3  Current Facility-Administered Medications:    methylPREDNISolone acetate (DEPO-MEDROL) injection 40 mg, 40 mg, Intramuscular, Once,   Allergies  Allergen Reactions   Alpha-Gal    Ciprofloxacin Itching    Objective:   LMP 12/31/2013   Patient is well-developed, well-nourished in no acute distress.  Resting comfortably at home.  Head is normocephalic, atraumatic.  No labored breathing.  Speech is clear and coherent with logical content.  Patient is alert and oriented at baseline.    Assessment and Plan:   Heather "[name]" was seen today for fever.  Diagnoses and all orders for this visit:  Fever and chills -     amoxicillin-clavulanate (AUGMENTIN) 875-125 MG tablet; Take 1 tablet by mouth 2 (two) times  daily for 7 days.  URI with cough and congestion -     amoxicillin-clavulanate (AUGMENTIN) 875-125 MG tablet; Take 1 tablet by mouth 2 (two) times daily for 7 days.  History of candidal vulvovaginitis -     fluconazole (DIFLUCAN) 150 MG tablet; Take 1 tablet (150 mg total) by mouth once for 1 dose.      Return if symptoms worsen or fail to improve.  Kari Baars, FNP-C [facility] [address] [place], [place] [address] [phone]  09/01/2023  Time spent with the patient: 10 minutes, of which >50% was spent in obtaining information about symptoms, reviewing previous labs, evaluations, and treatments, counseling about condition (please see the discussed topics above), and developing a plan to further investigate it; had a number of questions which I addressed.

## 2023-10-03 ENCOUNTER — Other Ambulatory Visit (HOSPITAL_COMMUNITY): Payer: Self-pay

## 2023-10-07 ENCOUNTER — Other Ambulatory Visit (HOSPITAL_COMMUNITY): Payer: Self-pay

## 2023-10-11 ENCOUNTER — Other Ambulatory Visit: Payer: Self-pay

## 2023-10-12 ENCOUNTER — Other Ambulatory Visit (HOSPITAL_COMMUNITY): Payer: Self-pay

## 2023-10-13 ENCOUNTER — Ambulatory Visit (INDEPENDENT_AMBULATORY_CARE_PROVIDER_SITE_OTHER)

## 2023-10-13 DIAGNOSIS — Z23 Encounter for immunization: Secondary | ICD-10-CM

## 2023-10-13 NOTE — Progress Notes (Signed)
 Tdap vaccine given to patient and patient tolerated well.

## 2023-10-18 ENCOUNTER — Other Ambulatory Visit: Payer: Self-pay | Admitting: Family Medicine

## 2023-10-18 ENCOUNTER — Telehealth: Payer: Self-pay

## 2023-10-18 DIAGNOSIS — Z8742 Personal history of other diseases of the female genital tract: Secondary | ICD-10-CM

## 2023-10-18 DIAGNOSIS — R3989 Other symptoms and signs involving the genitourinary system: Secondary | ICD-10-CM

## 2023-10-18 MED ORDER — SULFAMETHOXAZOLE-TRIMETHOPRIM 800-160 MG PO TABS: 1.0000 | ORAL_TABLET | Freq: Two times a day (BID) | ORAL | 0 refills | Status: AC

## 2023-10-18 MED ORDER — SULFAMETHOXAZOLE-TRIMETHOPRIM 800-160 MG PO TABS: 1.0000 | ORAL_TABLET | Freq: Two times a day (BID) | ORAL | 0 refills | Status: DC

## 2023-10-18 NOTE — Telephone Encounter (Signed)
 Patient states that she has been having dysuria and urinary odor x 3 days.  Would like abx be called in for her

## 2023-10-18 NOTE — Telephone Encounter (Signed)
Pt aware.

## 2023-10-18 NOTE — Addendum Note (Signed)
 Addended by: Sonny Masters on: 10/18/2023 10:52 AM   Modules accepted: Orders

## 2023-10-19 ENCOUNTER — Encounter: Payer: Self-pay | Admitting: Rheumatology

## 2023-10-20 MED ORDER — FLUCONAZOLE 150 MG PO TABS: ORAL_TABLET | ORAL | 0 refills | Status: DC

## 2023-10-20 NOTE — Addendum Note (Signed)
 Addended by: Sonny Masters on: 10/20/2023 10:04 AM   Modules accepted: Orders

## 2023-10-27 ENCOUNTER — Other Ambulatory Visit: Payer: Self-pay

## 2023-10-27 ENCOUNTER — Other Ambulatory Visit (HOSPITAL_COMMUNITY): Payer: Self-pay

## 2023-11-16 ENCOUNTER — Other Ambulatory Visit (HOSPITAL_COMMUNITY): Payer: Self-pay

## 2023-11-17 ENCOUNTER — Ambulatory Visit: Payer: Self-pay | Admitting: Rheumatology

## 2023-11-22 ENCOUNTER — Other Ambulatory Visit (HOSPITAL_COMMUNITY): Payer: Self-pay

## 2023-12-21 ENCOUNTER — Other Ambulatory Visit (HOSPITAL_COMMUNITY): Payer: Self-pay

## 2024-01-03 ENCOUNTER — Encounter: Payer: Self-pay | Admitting: Family Medicine

## 2024-01-03 ENCOUNTER — Telehealth: Admitting: Family Medicine

## 2024-01-03 DIAGNOSIS — M25562 Pain in left knee: Secondary | ICD-10-CM | POA: Diagnosis not present

## 2024-01-03 MED ORDER — PREDNISONE 20 MG PO TABS: 40.0000 mg | ORAL_TABLET | Freq: Every day | ORAL | 0 refills | Status: AC

## 2024-01-03 NOTE — Progress Notes (Signed)
 Virtual Visit via Video Note  I connected with Heather Lam on 01/03/24 at  3:50 PM EDT by a video enabled telemedicine application and verified that I am speaking with the correct person using two identifiers.  Patient Location: Home Provider Location: Office/Clinic  I discussed the limitations, risks, security, and privacy concerns of performing an evaluation and management service by video and the availability of in person appointments. I also discussed with the patient that there may be a patient responsible charge related to this service. The patient expressed understanding and agreed to proceed.  Subjective: PCP: Eliodoro Guerin, DO  Chief Complaint  Patient presents with   Knee Pain   Knee Pain   States that last week she started with left knee pain. Denies any injury or trauma. Pain with sitting and standing. Started on the front of her knee and has moved to side of her knee and back. She has tried ice, heating pad and tylenol  and reports minimal improvement in symptoms. Reports mild swelling. Denies erythema. She is not able to take nsaids due to CAD. States that it hurts constantly. 5/10, worsens with walking.   ROS: Per HPI  Current Outpatient Medications:    Aspirin  81 MG CAPS, Take 1 capsule by mouth daily at 6 (six) AM., Disp: , Rfl:    busPIRone  (BUSPAR ) 5 MG tablet, Take 1 tablet (5 mg total) by mouth 3 (three) times daily., Disp: 270 tablet, Rfl: 3   Calcium  500 MG tablet, Take 1 tablet by mouth daily at 6 (six) AM., Disp: , Rfl:    cephALEXin  (KEFLEX ) 250 MG capsule, Take 1 capsule (250 mg total) by mouth as needed., Disp: 30 capsule, Rfl: 1   Cholecalciferol  (VITAMIN D ) 125 MCG (5000 UT) CAPS, Take 1 capsule by mouth daily at 6 (six) AM., Disp: , Rfl:    EPINEPHrine  (EPIPEN  2-PAK) 0.3 mg/0.3 mL IJ SOAJ injection, Inject 0.3 mg into the muscle as needed for anaphylaxis. Then call 911, Disp: 2 each, Rfl: 0   escitalopram  (LEXAPRO ) 5 MG tablet, Take 1 tablet (5  mg total) by mouth daily., Disp: 90 tablet, Rfl: 3   Evolocumab  (REPATHA  SURECLICK) 140 MG/ML SOAJ, Inject 140 mg into the skin every 14 days., Disp: 2 mL, Rfl: 5   fluconazole  (DIFLUCAN ) 150 MG tablet, 1 po q week x 4 weeks, Disp: 4 tablet, Rfl: 0   HYDROcodone -acetaminophen  (NORCO) 5-325 MG tablet, Take 1 tablet by mouth every 6 (six) hours as needed for moderate pain (pain score 4-6)., Disp: 10 tablet, Rfl: 0   losartan -hydrochlorothiazide  (HYZAAR ) 100-25 MG tablet, Take 1 tablet by mouth daily., Disp: 90 tablet, Rfl: 3   metoprolol  tartrate (LOPRESSOR ) 25 MG tablet, Take 1 tablet (25 mg total) by mouth 2 (two) times daily., Disp: 180 tablet, Rfl: 3   nitroGLYCERIN  (NITROSTAT ) 0.4 MG SL tablet, DISSOLVE 1 TABLET UNDER TONGUE EVERY 5 MINUTES UP TO 3 TIMES FOR CHEST PAIN THEN CALL DR IF NO RELIEF, Disp: 25 tablet, Rfl: 3   predniSONE  (STERAPRED UNI-PAK 21 TAB) 10 MG (21) TBPK tablet, As directed x 6 days, Disp: 21 tablet, Rfl: 0   traZODone  (DESYREL ) 50 MG tablet, Take 1-2 tablets (50-100 mg total) by mouth at bedtime as needed., Disp: 90 tablet, Rfl: 3  Current Facility-Administered Medications:    methylPREDNISolone  acetate (DEPO-MEDROL ) injection 40 mg, 40 mg, Intramuscular, Once,   Observations/Objective: There were no vitals filed for this visit. Physical Exam Constitutional:      General: She is  awake. She is not in acute distress.    Appearance: Normal appearance. She is well-developed and well-groomed. She is not ill-appearing, toxic-appearing or diaphoretic.  Pulmonary:     Effort: Pulmonary effort is normal.  Neurological:     General: No focal deficit present.     Mental Status: She is alert and oriented to person, place, and time.  Psychiatric:        Attention and Perception: Attention and perception normal.        Mood and Affect: Mood and affect normal.        Speech: Speech normal.        Behavior: Behavior normal. Behavior is cooperative.        Thought Content: Thought  content normal.        Cognition and Memory: Cognition and memory normal.        Judgment: Judgment normal.    Assessment and Plan: 1. Acute pain of left knee (Primary) Will start medication as below.  Continue RICE therapy If not improved, will pursue imaging  Avoid NSAIDs given cardiac history.  - predniSONE  (DELTASONE ) 20 MG tablet; Take 2 tablets (40 mg total) by mouth daily with breakfast for 5 days.  Dispense: 10 tablet; Refill: 0   Appt time 01/03/2024 03:50 PM Call duration: 00:06:19  Follow Up Instructions: Return if symptoms worsen or fail to improve.   I discussed the assessment and treatment plan with the patient. The patient was provided an opportunity to ask questions, and all were answered. The patient agreed with the plan and demonstrated an understanding of the instructions.   The patient was advised to call back or seek an in-person evaluation if the symptoms worsen or if the condition fails to improve as anticipated.  The above assessment and management plan was discussed with the patient. The patient verbalized understanding of and has agreed to the management plan.   Jacqualyn Mates, DNP-FNP [facility] [address] [place], [place] [address] [phone]

## 2024-01-05 ENCOUNTER — Ambulatory Visit: Payer: Self-pay | Admitting: Family Medicine

## 2024-01-05 ENCOUNTER — Ambulatory Visit (INDEPENDENT_AMBULATORY_CARE_PROVIDER_SITE_OTHER)

## 2024-01-05 DIAGNOSIS — M25562 Pain in left knee: Secondary | ICD-10-CM

## 2024-01-05 NOTE — Addendum Note (Signed)
 Addended by: Michaela Adie B on: 01/05/2024 03:52 PM   Modules accepted: Orders

## 2024-01-05 NOTE — Progress Notes (Signed)
 No acute fracture. Some swelling. Continue current regimen with ice, elevation, compression. Will refer to ortho. Let me know if she has a preference on where to go.

## 2024-01-11 ENCOUNTER — Other Ambulatory Visit (HOSPITAL_COMMUNITY): Payer: Self-pay

## 2024-01-16 DIAGNOSIS — M25562 Pain in left knee: Secondary | ICD-10-CM | POA: Diagnosis not present

## 2024-01-18 ENCOUNTER — Other Ambulatory Visit (HOSPITAL_COMMUNITY): Payer: Self-pay

## 2024-02-07 DIAGNOSIS — M25562 Pain in left knee: Secondary | ICD-10-CM | POA: Diagnosis not present

## 2024-02-14 ENCOUNTER — Other Ambulatory Visit: Payer: Self-pay | Admitting: Family Medicine

## 2024-02-14 ENCOUNTER — Other Ambulatory Visit (HOSPITAL_COMMUNITY): Payer: Self-pay

## 2024-02-14 ENCOUNTER — Other Ambulatory Visit: Payer: Self-pay

## 2024-02-14 DIAGNOSIS — I2510 Atherosclerotic heart disease of native coronary artery without angina pectoris: Secondary | ICD-10-CM

## 2024-02-14 DIAGNOSIS — E782 Mixed hyperlipidemia: Secondary | ICD-10-CM

## 2024-02-14 DIAGNOSIS — I252 Old myocardial infarction: Secondary | ICD-10-CM

## 2024-02-14 MED ORDER — REPATHA SURECLICK 140 MG/ML ~~LOC~~ SOAJ
140.0000 mg | SUBCUTANEOUS | 0 refills | Status: DC
  Filled 2024-02-14: qty 6, 84d supply, fill #0

## 2024-02-16 ENCOUNTER — Other Ambulatory Visit (HOSPITAL_COMMUNITY): Payer: Self-pay

## 2024-02-16 DIAGNOSIS — S83232A Complex tear of medial meniscus, current injury, left knee, initial encounter: Secondary | ICD-10-CM | POA: Diagnosis not present

## 2024-03-09 ENCOUNTER — Other Ambulatory Visit (HOSPITAL_COMMUNITY): Payer: Self-pay

## 2024-03-09 ENCOUNTER — Other Ambulatory Visit: Payer: Self-pay

## 2024-03-09 ENCOUNTER — Ambulatory Visit: Admitting: Family Medicine

## 2024-03-09 ENCOUNTER — Encounter: Payer: Self-pay | Admitting: Family Medicine

## 2024-03-09 VITALS — BP 145/75 | HR 75 | Temp 97.8°F | Ht 63.0 in | Wt 202.1 lb

## 2024-03-09 DIAGNOSIS — E559 Vitamin D deficiency, unspecified: Secondary | ICD-10-CM

## 2024-03-09 DIAGNOSIS — I10 Essential (primary) hypertension: Secondary | ICD-10-CM

## 2024-03-09 DIAGNOSIS — F411 Generalized anxiety disorder: Secondary | ICD-10-CM

## 2024-03-09 DIAGNOSIS — Z0001 Encounter for general adult medical examination with abnormal findings: Secondary | ICD-10-CM | POA: Diagnosis not present

## 2024-03-09 DIAGNOSIS — M130 Polyarthritis, unspecified: Secondary | ICD-10-CM | POA: Diagnosis not present

## 2024-03-09 DIAGNOSIS — Z91018 Allergy to other foods: Secondary | ICD-10-CM | POA: Diagnosis not present

## 2024-03-09 DIAGNOSIS — Z0000 Encounter for general adult medical examination without abnormal findings: Secondary | ICD-10-CM

## 2024-03-09 DIAGNOSIS — I2510 Atherosclerotic heart disease of native coronary artery without angina pectoris: Secondary | ICD-10-CM

## 2024-03-09 DIAGNOSIS — Z9861 Coronary angioplasty status: Secondary | ICD-10-CM | POA: Diagnosis not present

## 2024-03-09 DIAGNOSIS — Z23 Encounter for immunization: Secondary | ICD-10-CM

## 2024-03-09 DIAGNOSIS — G4733 Obstructive sleep apnea (adult) (pediatric): Secondary | ICD-10-CM | POA: Diagnosis not present

## 2024-03-09 DIAGNOSIS — E782 Mixed hyperlipidemia: Secondary | ICD-10-CM | POA: Diagnosis not present

## 2024-03-09 DIAGNOSIS — R7303 Prediabetes: Secondary | ICD-10-CM | POA: Diagnosis not present

## 2024-03-09 LAB — BAYER DCA HB A1C WAIVED: HB A1C (BAYER DCA - WAIVED): 5.7 % — ABNORMAL HIGH (ref 4.8–5.6)

## 2024-03-09 LAB — LIPID PANEL

## 2024-03-09 MED ORDER — NITROGLYCERIN 0.4 MG SL SUBL
SUBLINGUAL_TABLET | SUBLINGUAL | 3 refills | Status: AC
Start: 2024-03-09 — End: ?
  Filled 2024-03-09: qty 25, 7d supply, fill #0

## 2024-03-09 MED ORDER — METOPROLOL TARTRATE 25 MG PO TABS
25.0000 mg | ORAL_TABLET | Freq: Two times a day (BID) | ORAL | 3 refills | Status: AC
Start: 2024-03-09 — End: ?
  Filled 2024-03-09 – 2024-06-29 (×3): qty 180, 90d supply, fill #0

## 2024-03-09 MED ORDER — BUSPIRONE HCL 5 MG PO TABS
5.0000 mg | ORAL_TABLET | Freq: Three times a day (TID) | ORAL | 3 refills | Status: AC
  Filled 2024-03-09: qty 270, 90d supply, fill #0

## 2024-03-09 MED ORDER — EPINEPHRINE 0.3 MG/0.3ML IJ SOAJ
0.3000 mg | INTRAMUSCULAR | 0 refills | Status: AC | PRN
  Filled 2024-03-09: qty 2, 10d supply, fill #0

## 2024-03-09 MED ORDER — TRAZODONE HCL 50 MG PO TABS
50.0000 mg | ORAL_TABLET | Freq: Every evening | ORAL | 3 refills | Status: AC | PRN
  Filled 2024-03-09: qty 90, 45d supply, fill #0
  Filled 2024-06-26 – 2024-06-29 (×2): qty 90, 45d supply, fill #1

## 2024-03-09 MED ORDER — REPATHA SURECLICK 140 MG/ML ~~LOC~~ SOAJ
140.0000 mg | SUBCUTANEOUS | 3 refills | Status: AC
  Filled 2024-03-09 – 2024-05-15 (×2): qty 6, 84d supply, fill #0
  Filled 2024-08-26: qty 6, 84d supply, fill #1

## 2024-03-09 MED ORDER — HYDROCODONE-ACETAMINOPHEN 5-325 MG PO TABS
1.0000 | ORAL_TABLET | Freq: Four times a day (QID) | ORAL | 0 refills | Status: AC | PRN
  Filled 2024-03-09: qty 10, 3d supply, fill #0

## 2024-03-09 MED ORDER — ESCITALOPRAM OXALATE 10 MG PO TABS
10.0000 mg | ORAL_TABLET | Freq: Every day | ORAL | 3 refills | Status: AC
  Filled 2024-03-09: qty 90, 90d supply, fill #0
  Filled 2024-07-29: qty 90, 90d supply, fill #1

## 2024-03-09 MED ORDER — CEPHALEXIN 250 MG PO CAPS
250.0000 mg | ORAL_CAPSULE | ORAL | 1 refills | Status: AC | PRN
  Filled 2024-03-09: qty 30, 30d supply, fill #0

## 2024-03-09 MED ORDER — LOSARTAN POTASSIUM-HCTZ 100-25 MG PO TABS
1.0000 | ORAL_TABLET | Freq: Every day | ORAL | 3 refills | Status: AC
  Filled 2024-03-09 – 2024-05-29 (×2): qty 90, 90d supply, fill #0
  Filled 2024-08-29: qty 90, 90d supply, fill #1

## 2024-03-09 NOTE — Patient Instructions (Signed)
 BP high.  Please make sure you are working on diet modification.  If still above 140/90 at your cardiac visit, you may need increased BP meds. Get pap done Get cardiology visit for preop cardiac clearance

## 2024-03-09 NOTE — Progress Notes (Addendum)
 Heather Lam is a 63 y.o. female presents to office today for annual physical exam examination.    Concerns today include: 1. Left knee pain Has a meniscus on the left side and is seeing Dr. Aluscio for this.  It is recommended that she get surgery but she has been delaying this.  She has not seen her cardiologist in a couple of years that she knows she needs to get a checkup with them soon.  Denies any chest pain, shortness of breath, difficulty swallowing.  It has given out on her once with walking but she did not fall because she had a friend with her.  Since her last visit she had a new grandson, Debby Fallow.  Occupation: nurse, Marital status: significant other, Substance use: none Health Maintenance Due  Topic Date Due   Hepatitis C Screening  Never done   Pneumococcal Vaccine: 50+ Years (1 of 2 - PCV) Never done   Zoster Vaccines- Shingrix (1 of 2) Never done   Hepatitis B Vaccines (2 of 3 - 19+ 3-dose series) 08/30/2002   COVID-19 Vaccine (3 - Pfizer risk series) 05/16/2020   Cervical Cancer Screening (HPV/Pap Cotest)  05/31/2021   MAMMOGRAM  02/20/2023   Refills needed today: all  Immunization History  Administered Date(s) Administered   Hepatitis B 08/02/2002   Influenza,inj,Quad PF,6+ Mos 05/06/2015, 05/10/2016, 05/04/2017, 04/28/2018, 05/23/2019   Influenza-Unspecified 05/27/2009, 05/14/2010, 05/22/2012, 06/02/2013, 05/02/2020   PFIZER(Purple Top)SARS-COV-2 Vaccination 03/27/2020, 04/18/2020   Td 10/16/2009, 02/19/2022   Tdap 10/16/2009, 08/02/2010, 10/13/2023   Past Medical History:  Diagnosis Date   Arthritis    Back pain    CAD (coronary artery disease)    Chest pain    Complication of anesthesia    Constipation    Coronary arteriosclerosis 05/04/2016   Diverticulitis    Diverticulosis    Family history of adverse reaction to anesthesia    Mom PONV   Heart attack (HCC) 2010   History of carpal tunnel syndrome    Bilateral   History of kidney stones     Hyperlipidemia    Hypertension    Intra-abdominal abscess (HCC)    Joint pain    Morbid obesity (HCC)    Myocardial infarction (HCC) 05/04/2009   PONV (postoperative nausea and vomiting)    Pre-diabetes    Skin cancer    on nose   Sleep apnea    not using cpap at this time   Vitamin D  deficiency    Social History   Socioeconomic History   Marital status: Divorced    Spouse name: Not on file   Number of children: Not on file   Years of education: 14   Highest education level: Associate degree: academic program  Occupational History   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[employer]    Comment: Adult nurse at [employer]  Tobacco Use   Smoking status: Former    Current packs/day: 0.00    Average packs/day: 1 pack/day for 20.0 years (20.0 ttl pk-yrs)    Types: Cigarettes    Start date: 2    Quit date: 2010    Years since quitting: 15.6   Smokeless tobacco: Never  Vaping Use   Vaping status: Never Used  Substance and Sexual Activity   Alcohol use: Not Currently    Comment: Rare    Drug use: No   Sexual activity: Yes    Birth control/protection: Post-menopausal  Other Topics Concern   Not on file  Social History Narrative   Caffeine daily    Right handed   One story home   Social Drivers of Health   Financial Resource Strain: Low Risk  (03/08/2024)   Overall Financial Resource Strain (CARDIA)    Difficulty of Paying Living Expenses: Not hard at all  Food Insecurity: No Food Insecurity (03/08/2024)   Hunger Vital Sign    Worried About Running Out of Food in the Last Year: Never true    Ran Out of Food in the Last Year: Never true  Transportation Needs: No Transportation Needs (03/08/2024)   PRAPARE - Administrator, Civil Service (Medical): No    Lack of Transportation (Non-Medical): No  Physical Activity: Inactive (03/08/2024)   Exercise Vital Sign    Days of Exercise per Week: 0 days    Minutes of Exercise per Session: Not on file   Stress: No Stress Concern Present (03/08/2024)   [employer] of Occupational Health - Occupational Stress Questionnaire    Feeling of Stress: Not at all  Social Connections: Moderately Integrated (03/08/2024)   Social Connection and Isolation Panel    Frequency of Communication with Friends and Family: More than three times a week    Frequency of Social Gatherings with Friends and Family: More than three times a week    Attends Religious Services: More than 4 times per year    Active Member of Clubs or Organizations: Yes    Attends Engineer, structural: More than 4 times per year    Marital Status: Divorced  Catering manager Violence: Not on file   Past Surgical History:  Procedure Laterality Date   APPENDECTOMY     APPLICATION OF WOUND VAC N/A 02/09/2019   Procedure: APPLICATION OF WOUND VAC;  Surgeon: Signe Mitzie LABOR, MD;  Location: [facility] OR;  Service: General;  Laterality: N/A;   BACK SURGERY     CARPAL TUNNEL RELEASE Right    CARPAL TUNNEL RELEASE Left 08/13/2016   Procedure: LEFT CARPAL TUNNEL RELEASE;  Surgeon: Elsie Mussel, MD;  Location: [facility];  Service: Orthopedics;  Laterality: Left;   COLECTOMY WITH COLOSTOMY CREATION/HARTMANN PROCEDURE N/A 02/09/2019   Procedure: HARTMANN PROCEDURE;  Surgeon: Signe Mitzie LABOR, MD;  Location: [facility] OR;  Service: General;  Laterality: N/A;   COLONOSCOPY     COLOSTOMY N/A 02/09/2019   Procedure: COLOSTOMY;  Surgeon: Signe Mitzie LABOR, MD;  Location: [facility] OR;  Service: General;  Laterality: N/A;   COLOSTOMY REVERSAL N/A 07/30/2019   Procedure: LAPAROSCOPIC ASSISTED REVERSAL OF END COLOSTOMY WITH RIGID PROCTOSCOPY;  Surgeon: Signe Mitzie LABOR, MD;  Location: [facility];  Service: General;  Laterality: N/A;   CORONARY STENT PLACEMENT     2 stents in the same artery   ENDOMETRIAL ABLATION  2005   EXTRACORPOREAL SHOCK WAVE LITHOTRIPSY Right 06/27/2017   Procedure: RIGHT EXTRACORPOREAL SHOCK WAVE LITHOTRIPSY (ESWL);  Surgeon:  Matilda Senior, MD;  Location: [facility];  Service: Urology;  Laterality: Right;   INCISIONAL HERNIA REPAIR N/A 10/10/2020   Procedure: LAPAROSCOPIC  WITH TRASITION TO OPEN INCISIONAL HERNIA REPAIR WITH MESH;  Surgeon: Signe Mitzie LABOR, MD;  Location: [facility];  Service: General;  Laterality: N/A;   KNEE ARTHROSCOPY WITH MENISCAL REPAIR Right    MOHS SURGERY     TOTAL HIP ARTHROPLASTY Left 10/26/2021   Procedure: TOTAL HIP ARTHROPLASTY ANTERIOR APPROACH;  Surgeon: Melodi Lerner, MD;  Location: [facility];  Service: Orthopedics;  Laterality: Left;   TUBAL LIGATION  1991  Family History  Problem Relation Age of Onset   Hyperlipidemia Mother    Hypertension Mother    Cancer Father        skin   Hyperlipidemia Father    Dementia Father    Arthritis Father    Hypertension Father    Sleep apnea Father    Other Sister        Myelofibrosis   Arthritis Sister    Hyperlipidemia Sister    Arthritis Sister    Hyperlipidemia Sister    Diabetes Paternal Grandfather    Colon cancer Neg Hx     Current Outpatient Medications:    Aspirin  81 MG CAPS, Take 1 capsule by mouth daily at 6 (six) AM., Disp: , Rfl:    busPIRone  (BUSPAR ) 5 MG tablet, Take 1 tablet (5 mg total) by mouth 3 (three) times daily., Disp: 270 tablet, Rfl: 3   cephALEXin  (KEFLEX ) 250 MG capsule, Take 1 capsule (250 mg total) by mouth as needed., Disp: 30 capsule, Rfl: 1   EPINEPHrine  (EPIPEN  2-PAK) 0.3 mg/0.3 mL IJ SOAJ injection, Inject 0.3 mg into the muscle as needed for anaphylaxis. Then call 911, Disp: 2 each, Rfl: 0   escitalopram  (LEXAPRO ) 5 MG tablet, Take 1 tablet (5 mg total) by mouth daily., Disp: 90 tablet, Rfl: 3   Evolocumab  (REPATHA  SURECLICK) 140 MG/ML SOAJ, Inject 140 mg into the skin every 14 days., Disp: 6 mL, Rfl: 0   HYDROcodone -acetaminophen  (NORCO) 5-325 MG tablet, Take 1 tablet by mouth every 6 (six) hours as needed for moderate pain (pain score 4-6)., Disp: 10 tablet, Rfl: 0   losartan -hydrochlorothiazide   (HYZAAR ) 100-25 MG tablet, Take 1 tablet by mouth daily., Disp: 90 tablet, Rfl: 3   metoprolol  tartrate (LOPRESSOR ) 25 MG tablet, Take 1 tablet (25 mg total) by mouth 2 (two) times daily., Disp: 180 tablet, Rfl: 3   nitroGLYCERIN  (NITROSTAT ) 0.4 MG SL tablet, DISSOLVE 1 TABLET UNDER TONGUE EVERY 5 MINUTES UP TO 3 TIMES FOR CHEST PAIN THEN CALL DR IF NO RELIEF, Disp: 25 tablet, Rfl: 3   traZODone  (DESYREL ) 50 MG tablet, Take 1-2 tablets (50-100 mg total) by mouth at bedtime as needed., Disp: 90 tablet, Rfl: 3  Allergies  Allergen Reactions   Alpha-Gal    Ciprofloxacin  Itching     ROS: Review of Systems Pertinent items noted in HPI and remainder of comprehensive ROS otherwise negative.    Physical exam BP (!) 145/75   Pulse 75   Temp 97.8 F (36.6 C)   Ht 5' 3 (1.6 m)   Wt 202 lb 2 oz (91.7 kg)   LMP 12/31/2013   SpO2 96%   BMI 35.80 kg/m  General appearance: alert, cooperative, appears stated age, and no distress Head: Normocephalic, without obvious abnormality, atraumatic Eyes: negative findings: lids and lashes normal, conjunctivae and sclerae normal, corneas clear, and pupils equal, round, reactive to light and accomodation Ears: Cerumen blocking bilateral TMs Nose: Nares normal. Septum midline. Mucosa normal. No drainage or sinus tenderness. Throat: lips, mucosa, and tongue normal; teeth and gums normal Neck: no adenopathy, no carotid bruit, supple, symmetrical, trachea midline, and thyroid  not enlarged, symmetric, no tenderness/mass/nodules Back: symmetric, no curvature. ROM normal. No CVA tenderness. Lungs: clear to auscultation bilaterally Heart: regular rate and rhythm, S1, S2 normal, no murmur, click, rub or gallop Abdomen: soft, non-tender; bowel sounds normal; no masses,  no organomegaly Extremities: extremities normal, atraumatic, no cyanosis or edema Pulses: 2+ and symmetric Skin: Several seborrheic keratoses that are flesh-colored. Lymph  nodes: Cervical,  supraclavicular, and axillary nodes normal. Neurologic: Grossly normal      03/17/2023    3:59 PM 01/04/2023    9:04 AM 09/27/2022    8:36 AM  Depression screen PHQ 2/9  Decreased Interest 0 0 0  Down, Depressed, Hopeless 0 0 0  PHQ - 2 Score 0 0 0  Altered sleeping  0 0  Tired, decreased energy  0 0  Change in appetite  0 0  Feeling bad or failure about yourself   0 0  Trouble concentrating  0 0  Moving slowly or fidgety/restless  0 0  Suicidal thoughts  0 0  PHQ-9 Score  0 0  Difficult doing work/chores  Not difficult at all Not difficult at all      01/04/2023    9:04 AM 09/27/2022    8:36 AM 06/17/2022    3:17 PM 02/19/2022    9:50 AM  GAD 7 : Generalized Anxiety Score  Nervous, Anxious, on Edge 0 0 0 0  Control/stop worrying 0 0 0 0  Worry too much - different things 0 0 0 0  Trouble relaxing 0 0 0 0  Restless 0 0 0 0  Easily annoyed or irritable 0 0 0 0  Afraid - awful might happen 0 0 0 0  Total GAD 7 Score 0 0 0 0  Anxiety Difficulty Not difficult at all Not difficult at all Not difficult at all Not difficult at all     Assessment/ Plan: Romero MARLA Farrow here for annual physical exam.   Annual physical exam  Generalized anxiety disorder - Plan: busPIRone  (BUSPAR ) 5 MG tablet, escitalopram  (LEXAPRO ) 10 MG tablet  Need for vaccination against Streptococcus pneumoniae  Polyarthritis - Plan: HYDROcodone -acetaminophen  (NORCO) 5-325 MG tablet  CAD S/P percutaneous coronary angioplasty - Plan: CMP14+EGFR, Lipid Panel, TSH, CBC, Evolocumab  (REPATHA  SURECLICK) 140 MG/ML SOAJ, metoprolol  tartrate (LOPRESSOR ) 25 MG tablet  Mixed hyperlipidemia - Plan: CMP14+EGFR, Lipid Panel, TSH, CBC, Evolocumab  (REPATHA  SURECLICK) 140 MG/ML SOAJ  Essential hypertension - Plan: CMP14+EGFR, CBC, losartan -hydrochlorothiazide  (HYZAAR ) 100-25 MG tablet, metoprolol  tartrate (LOPRESSOR ) 25 MG tablet  OSA on CPAP - Plan: CMP14+EGFR, CBC  Vitamin D  deficiency - Plan: CMP14+EGFR, VITAMIN D  25  Hydroxy (Vit-D Deficiency, Fractures)  Prediabetes - Plan: CMP14+EGFR, Bayer DCA Hb A1c Waived  Allergy to alpha-gal - Plan: EPINEPHrine  (EPIPEN  2-PAK) 0.3 mg/0.3 mL IJ SOAJ injection  Patient self-reported anxiety to be a little bit more than her baseline but it is very intermittent.  Will go ahead and proceed with increased dose of Lexapro .  This has been sent.  BuSpar  also sent as needed use  Pneumococcal vaccination DECLINED  Norco renewed for as needed use.  She is up-to-date on UDS and CSC and the national narcotic database was reviewed with no red flags.  Continue to follow-up with orthopedics for left knee  Follow-up with cardiology for ongoing monitoring of her CAD.  Fasting labs were collected and I renewed all of her heart medications today  Blood pressure was not at goal discussed goal of less than 140/90.  Diet modification reinforced and if blood pressure not at goal by cardiac visit she will need to consider advancing her therapies. ?  Add amlodipine  Check vitamin D  level, A1c given prediabetes.  EpiPen  renewed for as needed use against alpha gal that has not required this  Counseled on healthy lifestyle choices, including diet (rich in fruits, vegetables and lean meats and low in salt and simple carbohydrates) and  exercise (at least 30 minutes of moderate physical activity daily).  Patient to follow up 1 year for CPE  Florrie Ramires M. Jolinda, DO

## 2024-03-09 NOTE — Addendum Note (Signed)
 Addended by: JOLINDA NORENE HERO on: 03/09/2024 10:34 AM   Modules accepted: Level of Service

## 2024-03-10 LAB — CMP14+EGFR
ALT: 16 IU/L (ref 0–32)
AST: 17 IU/L (ref 0–40)
Albumin: 3.9 g/dL (ref 3.9–4.9)
Alkaline Phosphatase: 77 IU/L (ref 44–121)
BUN/Creatinine Ratio: 20 (ref 12–28)
BUN: 12 mg/dL (ref 8–27)
Bilirubin Total: <0.2 mg/dL (ref 0.0–1.2)
CO2: 21 mmol/L (ref 20–29)
Calcium: 9.1 mg/dL (ref 8.7–10.3)
Chloride: 102 mmol/L (ref 96–106)
Creatinine, Ser: 0.61 mg/dL (ref 0.57–1.00)
Globulin, Total: 2.5 g/dL (ref 1.5–4.5)
Glucose: 91 mg/dL (ref 70–99)
Potassium: 4.3 mmol/L (ref 3.5–5.2)
Sodium: 142 mmol/L (ref 134–144)
Total Protein: 6.4 g/dL (ref 6.0–8.5)
eGFR: 100 mL/min/1.73 (ref >59)

## 2024-03-10 LAB — CBC
Hematocrit: 45.4 % (ref 34.0–46.6)
Hemoglobin: 14.6 g/dL (ref 11.1–15.9)
MCH: 29.1 pg (ref 26.6–33.0)
MCHC: 32.2 g/dL (ref 31.5–35.7)
MCV: 90 fL (ref 79–97)
Platelets: 242 x10E3/uL (ref 150–450)
RBC: 5.02 x10E6/uL (ref 3.77–5.28)
RDW: 13.2 % (ref 11.7–15.4)
WBC: 8.7 x10E3/uL (ref 3.4–10.8)

## 2024-03-10 LAB — LIPID PANEL
Chol/HDL Ratio: 3.1 ratio (ref 0.0–4.4)
Cholesterol, Total: 100 mg/dL (ref 100–199)
HDL: 32 mg/dL — ABNORMAL LOW (ref >39)
LDL Chol Calc (NIH): 19 mg/dL (ref 0–99)
Triglycerides: 347 mg/dL — ABNORMAL HIGH (ref 0–149)
VLDL Cholesterol Cal: 49 mg/dL — ABNORMAL HIGH (ref 5–40)

## 2024-03-10 LAB — TSH: TSH: 1.98 u[IU]/mL (ref 0.450–4.500)

## 2024-03-10 LAB — VITAMIN D 25 HYDROXY (VIT D DEFICIENCY, FRACTURES): Vit D, 25-Hydroxy: 33.3 ng/mL (ref 30.0–100.0)

## 2024-03-12 ENCOUNTER — Ambulatory Visit: Payer: Self-pay | Admitting: Family Medicine

## 2024-04-24 IMAGING — DX DG FOOT COMPLETE 3+V*L*
3 series · 3 of 3 positions shown · non-contrast
Comparison: None Available.

CLINICAL DATA: Left foot pain and swelling.

EXAM:
LEFT FOOT - COMPLETE 3+ VIEW

[foot ap]
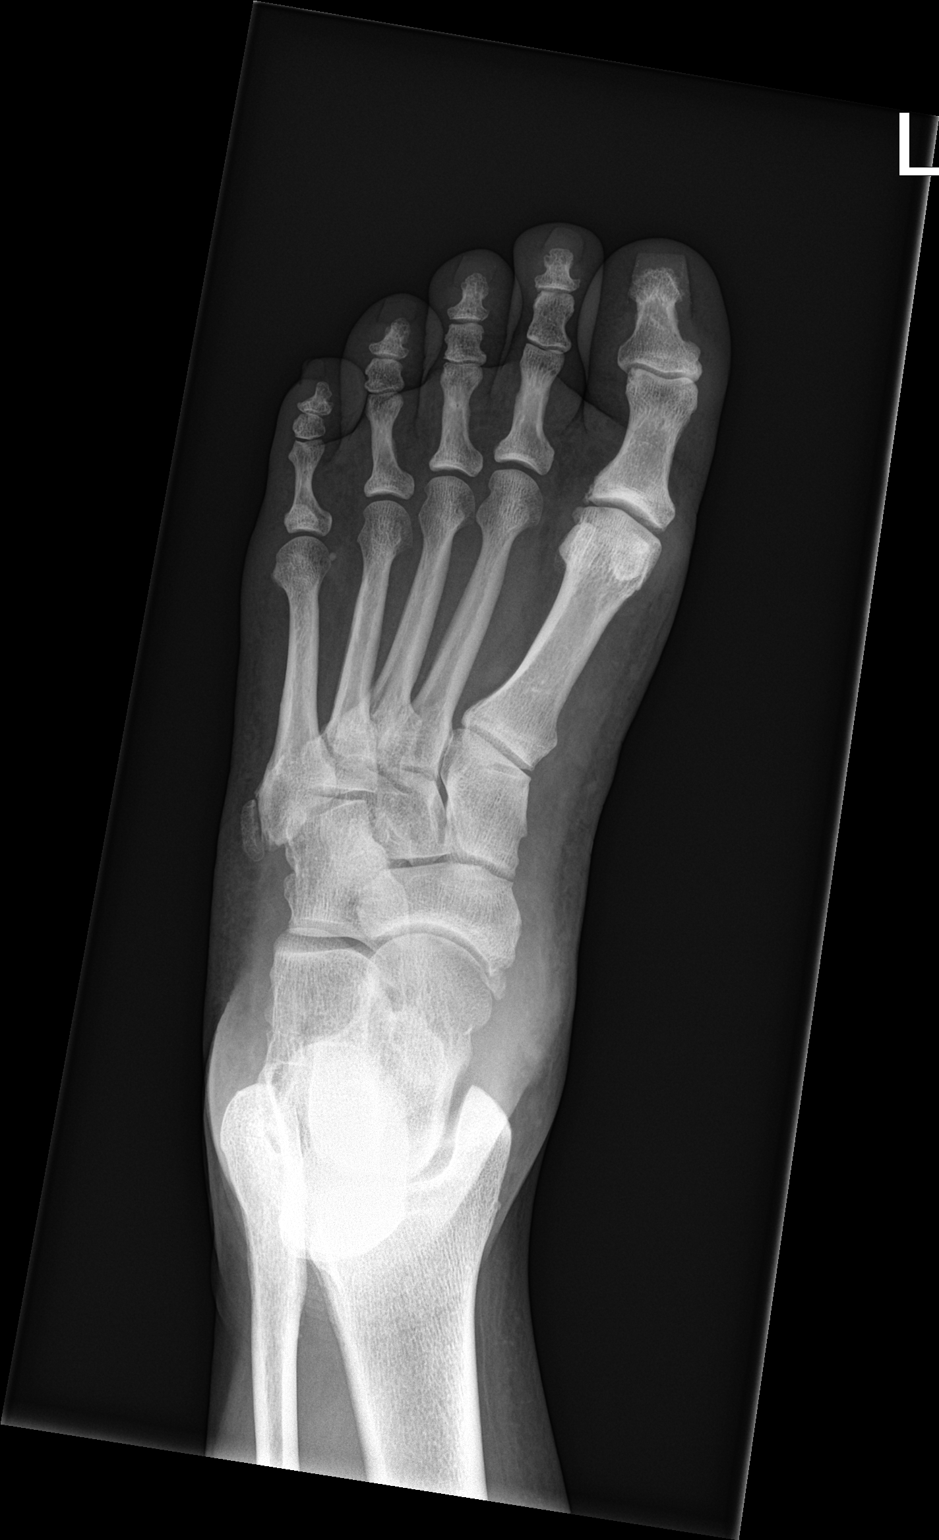

[foot obl]
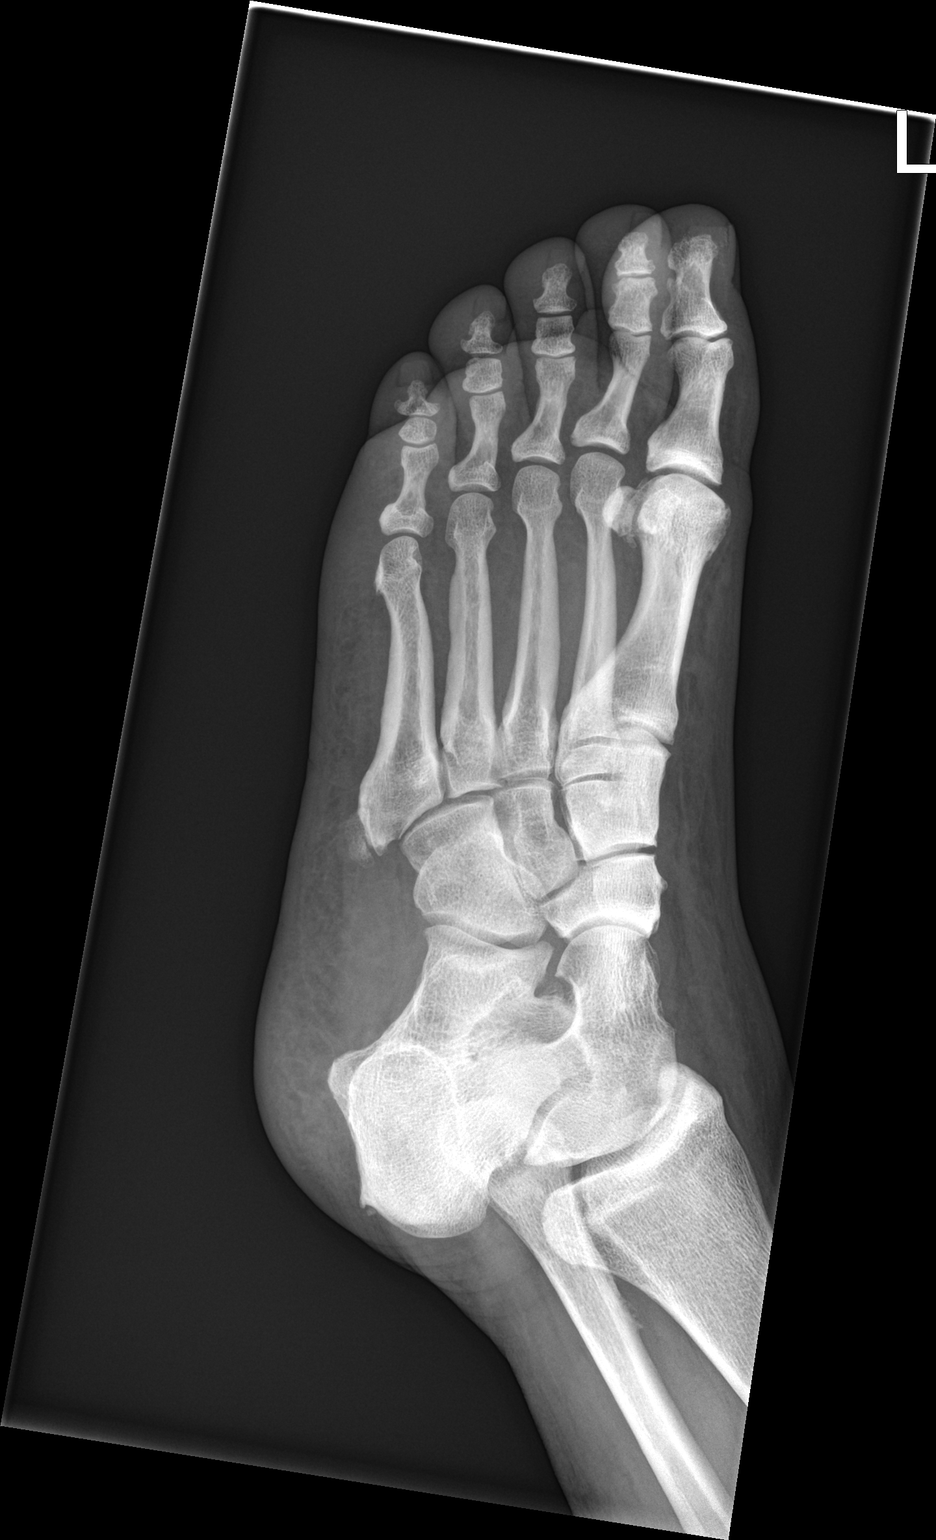

[foot lat]
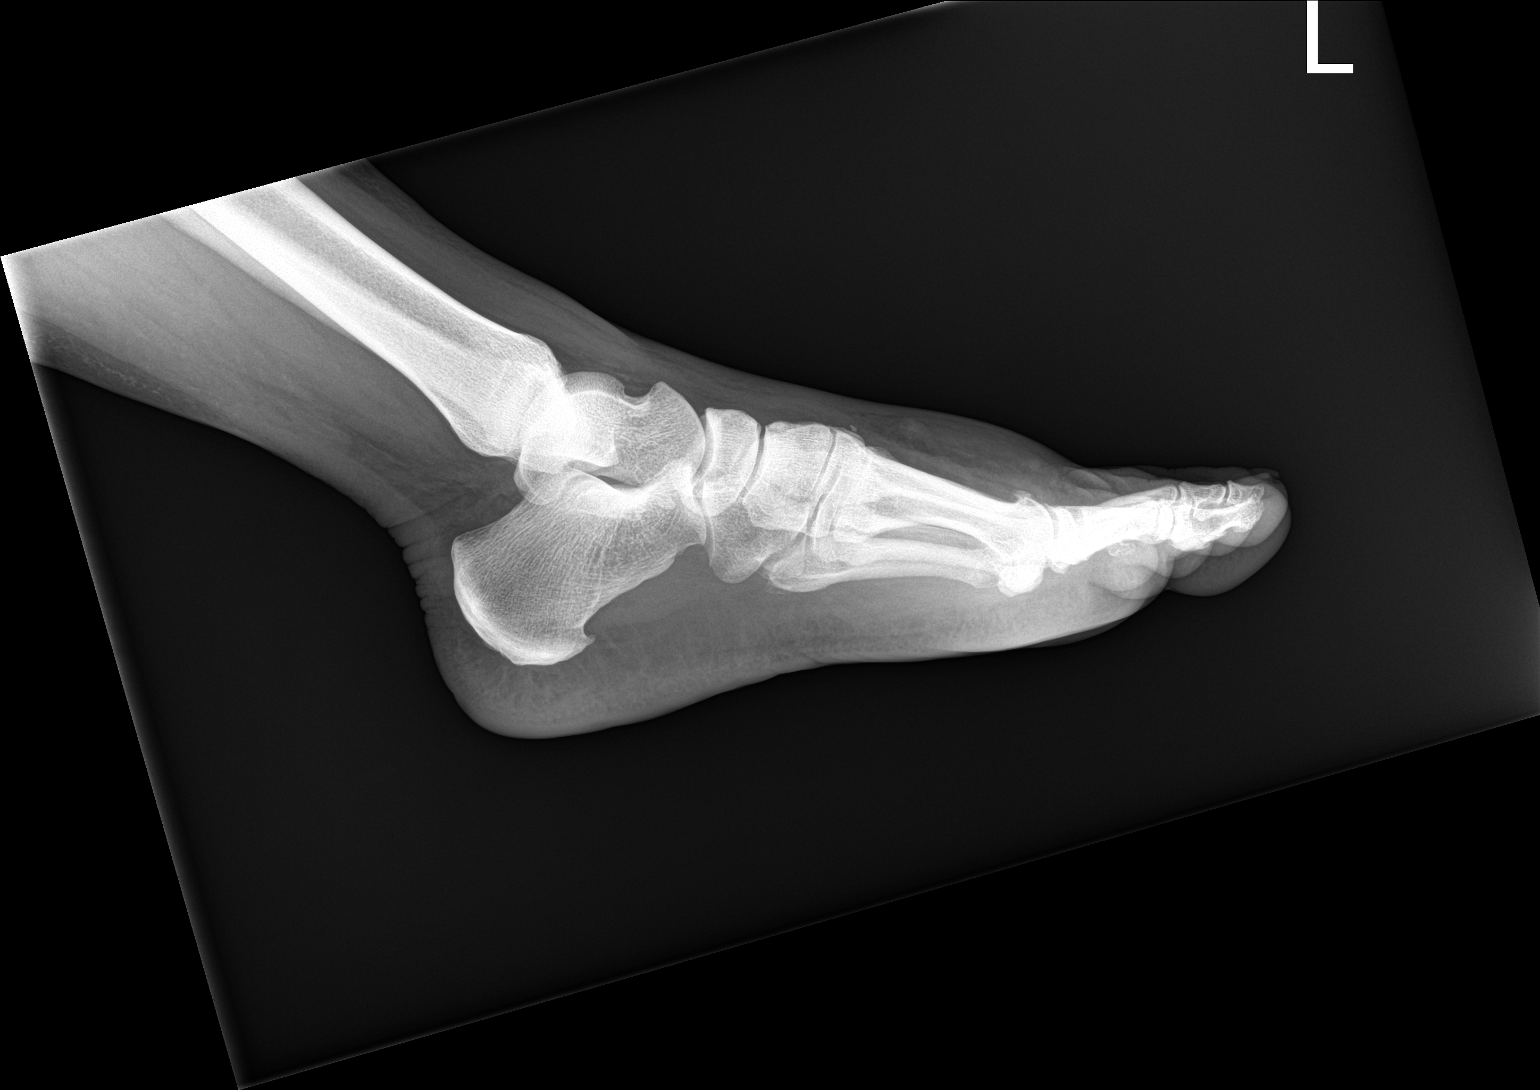

[3 of 3 positions shown; findings below may reference images not displayed]

FINDINGS: There is no evidence of fracture or dislocation. There is no
evidence of arthropathy or other focal bone abnormality. Soft
tissues are unremarkable.
IMPRESSION: Negative.

## 2024-05-08 ENCOUNTER — Encounter: Payer: Self-pay | Admitting: Family Medicine

## 2024-05-08 ENCOUNTER — Other Ambulatory Visit (HOSPITAL_COMMUNITY): Payer: Self-pay

## 2024-05-08 DIAGNOSIS — I10 Essential (primary) hypertension: Secondary | ICD-10-CM | POA: Diagnosis not present

## 2024-05-08 MED ORDER — AMLODIPINE BESYLATE 2.5 MG PO TABS
2.5000 mg | ORAL_TABLET | Freq: Every day | ORAL | 3 refills | Status: AC
  Filled 2024-05-08: qty 90, 90d supply, fill #0
  Filled 2024-07-29: qty 90, 90d supply, fill #1

## 2024-05-08 NOTE — Telephone Encounter (Signed)
 Please see the MyChart message reply(ies) for my assessment and plan.    This patient gave consent for this Medical Advice Message and is aware that it may result in a bill to [employer], as well as the possibility of receiving a bill for a co-payment or deductible. They are an established patient, but are not seeking medical advice exclusively about a problem treated during an in person or video visit in the last seven days. I did not recommend an in person or video visit within seven days of my reply.    I spent a total of 5 minutes cumulative time within 7 days through [employer].  Norene Fielding, DO

## 2024-05-15 ENCOUNTER — Other Ambulatory Visit (HOSPITAL_COMMUNITY): Payer: Self-pay

## 2024-05-16 ENCOUNTER — Other Ambulatory Visit: Payer: Self-pay

## 2024-05-25 ENCOUNTER — Ambulatory Visit (HOSPITAL_BASED_OUTPATIENT_CLINIC_OR_DEPARTMENT_OTHER): Payer: Self-pay | Admitting: Cardiology

## 2024-05-29 ENCOUNTER — Other Ambulatory Visit (HOSPITAL_COMMUNITY): Payer: Self-pay

## 2024-06-26 ENCOUNTER — Other Ambulatory Visit (HOSPITAL_COMMUNITY): Payer: Self-pay

## 2024-06-26 ENCOUNTER — Other Ambulatory Visit: Payer: Self-pay

## 2024-06-29 ENCOUNTER — Other Ambulatory Visit (HOSPITAL_COMMUNITY): Payer: Self-pay

## 2024-07-29 ENCOUNTER — Other Ambulatory Visit (HOSPITAL_COMMUNITY): Payer: Self-pay

## 2024-08-07 ENCOUNTER — Telehealth (INDEPENDENT_AMBULATORY_CARE_PROVIDER_SITE_OTHER): Admitting: Family Medicine

## 2024-08-07 DIAGNOSIS — J069 Acute upper respiratory infection, unspecified: Secondary | ICD-10-CM

## 2024-08-07 DIAGNOSIS — Z8742 Personal history of other diseases of the female genital tract: Secondary | ICD-10-CM | POA: Diagnosis not present

## 2024-08-07 MED ORDER — FLUCONAZOLE 150 MG PO TABS: ORAL_TABLET | ORAL | 0 refills | Status: AC

## 2024-08-07 MED ORDER — AMOXICILLIN-POT CLAVULANATE 875-125 MG PO TABS: 1.0000 | ORAL_TABLET | Freq: Two times a day (BID) | ORAL | 0 refills | Status: AC

## 2024-08-07 NOTE — Progress Notes (Signed)
 "   Virtual Visit via Video   I connected with patient on 08/07/2024 at 1210 by a video enabled telemedicine application and verified that I am speaking with the correct person using two identifiers.  Location patient: Home Location provider: [facility] Persons participating in the virtual visit: Patient and Provider  I discussed the limitations of evaluation and management by telemedicine and the availability of in person appointments. The patient expressed understanding and agreed to proceed.  Subjective:   HPI:  Pt presents today for  Chief Complaint  Patient presents with   URI   Heather Lam Heather Lam is a 64 year old female who presents with worsening cold symptoms and possible infection.  Her symptoms began at the start of last week with nasal and chest congestion, accompanied by headaches. Over the last few days, her symptoms have worsened.  She has been self-medicating with DayQuil, Mucinex , NyQuil, vitamin C, and zinc without improvement. She describes her cough as productive with colored sputum and notes a 'nasty' taste when she coughs or blows her nose, raising her concern about a possible infection.  She has used Flonase sporadically but not consistently. She cannot take doxycycline  because of her alpha-gal syndrome.       ROS per HPI  Patient Active Problem List   Diagnosis Date Noted   OA (osteoarthritis) of hip 10/26/2021   Osteoarthritis of left hip 10/26/2021   S/P hernia surgery 10/10/2020   History of colostomy reversal 07/30/2019   Cyst of ovary 02/20/2019   Kidney stone 02/20/2019   Migraine 02/20/2019   Actinic keratoses 05/04/2017   History of MI (myocardial infarction) 04/01/2017   Prediabetes 09/28/2016   Vitamin D  deficiency 08/16/2016   Recurrent major depressive disorder, in partial remission 05/25/2016   Diverticular disease 05/04/2016   Malignant neoplasm of skin 05/04/2016   Carpal tunnel syndrome 02/26/2016    Obesity (BMI 30-39.9) 02/25/2016   Insomnia 07/01/2015   Essential hypertension 05/21/2015   Generalized anxiety disorder 06/06/2013   CAD S/P percutaneous coronary angioplasty 09/13/2011   Antiplatelet or antithrombotic long-term use 09/13/2011   OSA on CPAP 09/13/2011   Hyperlipidemia 09/13/2011    Social History   Tobacco Use   Smoking status: Former    Current packs/day: 0.00    Average packs/day: 1 pack/day for 20.0 years (20.0 ttl pk-yrs)    Types: Cigarettes    Start date: 63    Quit date: 2010    Years since quitting: 16.0   Smokeless tobacco: Never  Substance Use Topics   Alcohol use: Not Currently    Comment: Rare    Current Medications[1]  Allergies[2]  Objective:   LMP 12/31/2013   Patient is well-developed, well-nourished in no acute distress.  Resting comfortably at home.  Head is normocephalic, atraumatic.  No labored breathing.  Speech is clear and coherent with logical content.  Patient is alert and oriented at baseline.    Assessment and Plan:   Heather Lam was seen today for uri.  Diagnoses and all orders for this visit:  URI with cough and congestion -     amoxicillin -clavulanate (AUGMENTIN ) 875-125 MG tablet; Take 1 tablet by mouth 2 (two) times daily.  History of vaginitis -     fluconazole  (DIFLUCAN ) 150 MG tablet; 1 po q week x 4 weeks        Acute upper respiratory infection Symptoms persisting for over a week and a half, including nasal and chest congestion, headache, and colored sputum,  suggesting possible bacterial infection. Taste disturbance likely due to infection. Doxycycline  contraindicated due to alpha-gal allergy. - Prescribed Augmentin  twice daily for 10 days. - Advised use of Flonase for the next couple of weeks.  Prophylaxis for antibiotic-associated candidiasis Potential for yeast infection with antibiotic use. - Prescribed Diflucan  for prophylaxis against yeast infection.        Return if symptoms worsen or  fail to improve.  Heather Bruns, FNP-C [facility] [address] [place], [place] [address] [phone]  08/07/2024  Time spent with the patient: 12 minutes, of which >50% was spent in obtaining information about symptoms, reviewing previous labs, evaluations, and treatments, counseling about condition (please see the discussed topics above), and developing a plan to further investigate it; had a number of questions which I addressed.      [1]  Current Outpatient Medications:    amoxicillin -clavulanate (AUGMENTIN ) 875-125 MG tablet, Take 1 tablet by mouth 2 (two) times daily., Disp: 20 tablet, Rfl: 0   fluconazole  (DIFLUCAN ) 150 MG tablet, 1 po q week x 4 weeks, Disp: 4 tablet, Rfl: 0   amLODipine  (NORVASC ) 2.5 MG tablet, Take 1 tablet (2.5 mg total) by mouth daily., Disp: 90 tablet, Rfl: 3   Aspirin  81 MG CAPS, Take 1 capsule by mouth daily at 6 (six) AM., Disp: , Rfl:    busPIRone  (BUSPAR ) 5 MG tablet, Take 1 tablet (5 mg total) by mouth 3 (three) times daily., Disp: 270 tablet, Rfl: 3   cephALEXin  (KEFLEX ) 250 MG capsule, Take 1 capsule (250 mg total) by mouth as needed., Disp: 30 capsule, Rfl: 1   EPINEPHrine  (EPIPEN  2-PAK) 0.3 mg/0.3 mL IJ SOAJ injection, Inject 0.3 mg into the muscle as needed for anaphylaxis. Then call 911, Disp: 2 each, Rfl: 0   escitalopram  (LEXAPRO ) 10 MG tablet, Take 1 tablet (10 mg total) by mouth daily., Disp: 90 tablet, Rfl: 3   Evolocumab  (REPATHA  SURECLICK) 140 MG/ML SOAJ, Inject 140 mg into the skin every 14 days., Disp: 6 mL, Rfl: 3   HYDROcodone -acetaminophen  (NORCO/VICODIN) 5-325 MG tablet, Take 1 tablet by mouth every 6 (six) hours as needed for moderate pain (pain score 4-6)., Disp: 10 tablet, Rfl: 0   losartan -hydrochlorothiazide  (HYZAAR ) 100-25 MG tablet, Take 1 tablet by mouth daily., Disp: 90 tablet, Rfl: 3   metoprolol  tartrate (LOPRESSOR ) 25 MG tablet, Take 1 tablet (25 mg total) by mouth 2 (two) times daily., Disp: 180  tablet, Rfl: 3   nitroGLYCERIN  (NITROSTAT ) 0.4 MG SL tablet, DISSOLVE 1 TABLET UNDER TONGUE EVERY 5 MINUTES UP TO 3 TIMES FOR CHEST PAIN THEN CALL DR IF NO RELIEF, Disp: 25 tablet, Rfl: 3   traZODone  (DESYREL ) 50 MG tablet, Take 1-2 tablets (50-100 mg total) by mouth at bedtime as needed., Disp: 90 tablet, Rfl: 3 [2]  Allergies Allergen Reactions   Alpha-Gal    Ciprofloxacin  Itching   "

## 2024-08-16 ENCOUNTER — Ambulatory Visit (HOSPITAL_BASED_OUTPATIENT_CLINIC_OR_DEPARTMENT_OTHER): Admitting: Cardiovascular Disease

## 2024-08-27 ENCOUNTER — Other Ambulatory Visit: Payer: Self-pay

## 2024-08-29 ENCOUNTER — Other Ambulatory Visit (HOSPITAL_COMMUNITY): Payer: Self-pay
